# Patient Record
Sex: Female | Born: 1940
Health system: Southern US, Community
[De-identification: ages and names within clinical notes are randomized; demographics above are authoritative.]

## PROBLEM LIST (undated history)

## (undated) DIAGNOSIS — D860 Sarcoidosis of lung: Secondary | ICD-10-CM

## (undated) DIAGNOSIS — R51 Headache: Secondary | ICD-10-CM

## (undated) DIAGNOSIS — M719 Bursopathy, unspecified: Secondary | ICD-10-CM

## (undated) DIAGNOSIS — Z860100 Personal history of colon polyps, unspecified: Secondary | ICD-10-CM

## (undated) DIAGNOSIS — M810 Age-related osteoporosis without current pathological fracture: Secondary | ICD-10-CM

## (undated) DIAGNOSIS — K644 Residual hemorrhoidal skin tags: Secondary | ICD-10-CM

## (undated) DIAGNOSIS — K219 Gastro-esophageal reflux disease without esophagitis: Secondary | ICD-10-CM

## (undated) DIAGNOSIS — G4730 Sleep apnea, unspecified: Secondary | ICD-10-CM

## (undated) DIAGNOSIS — K859 Acute pancreatitis without necrosis or infection, unspecified: Secondary | ICD-10-CM

## (undated) DIAGNOSIS — F519 Sleep disorder not due to a substance or known physiological condition, unspecified: Secondary | ICD-10-CM

## (undated) DIAGNOSIS — L740 Miliaria rubra: Secondary | ICD-10-CM

## (undated) DIAGNOSIS — T7840XA Allergy, unspecified, initial encounter: Secondary | ICD-10-CM

## (undated) DIAGNOSIS — E039 Hypothyroidism, unspecified: Secondary | ICD-10-CM

## (undated) DIAGNOSIS — J342 Deviated nasal septum: Secondary | ICD-10-CM

## (undated) DIAGNOSIS — C801 Malignant (primary) neoplasm, unspecified: Secondary | ICD-10-CM

## (undated) DIAGNOSIS — Q393 Congenital stenosis and stricture of esophagus: Secondary | ICD-10-CM

## (undated) DIAGNOSIS — A809 Acute poliomyelitis, unspecified: Secondary | ICD-10-CM

## (undated) DIAGNOSIS — E538 Deficiency of other specified B group vitamins: Secondary | ICD-10-CM

## (undated) DIAGNOSIS — M1990 Unspecified osteoarthritis, unspecified site: Secondary | ICD-10-CM

## (undated) DIAGNOSIS — D509 Iron deficiency anemia, unspecified: Secondary | ICD-10-CM

## (undated) DIAGNOSIS — R5383 Other fatigue: Secondary | ICD-10-CM

## (undated) HISTORY — DX: Acute poliomyelitis, unspecified: A80.9

## (undated) HISTORY — DX: Other fatigue: R53.83

## (undated) HISTORY — DX: Acute pancreatitis without necrosis or infection, unspecified: K85.90

## (undated) HISTORY — DX: Iron deficiency anemia, unspecified: D50.9

## (undated) HISTORY — DX: Sarcoidosis of lung: D86.0

## (undated) HISTORY — DX: Gastro-esophageal reflux disease without esophagitis: K21.9

## (undated) HISTORY — DX: Personal history of colon polyps, unspecified: Z86.0100

## (undated) HISTORY — DX: Bursopathy, unspecified: M71.9

## (undated) HISTORY — DX: Deficiency of other specified B group vitamins: E53.8

## (undated) HISTORY — DX: Allergy, unspecified, initial encounter: T78.40XA

## (undated) HISTORY — DX: Congenital stenosis and stricture of esophagus: Q39.3

## (undated) HISTORY — PX: ROTATOR CUFF REPAIR: SHX139

## (undated) HISTORY — DX: Deviated nasal septum: J34.2

## (undated) HISTORY — PX: KNEE ARTHROSCOPY: SHX127

## (undated) HISTORY — DX: Age-related osteoporosis without current pathological fracture: M81.0

## (undated) HISTORY — DX: Hypothyroidism, unspecified: E03.9

## (undated) HISTORY — PX: TOE SURGERY: SHX1073

## (undated) HISTORY — PX: WISDOM TOOTH EXTRACTION: SHX21

## (undated) HISTORY — DX: Sleep disorder not due to a substance or known physiological condition, unspecified: F51.9

## (undated) HISTORY — PX: COSMETIC SURGERY: SHX468

## (undated) HISTORY — DX: Unspecified osteoarthritis, unspecified site: M19.90

## (undated) HISTORY — PX: UPPER GASTROINTESTINAL ENDOSCOPY: SHX188

## (undated) HISTORY — DX: Residual hemorrhoidal skin tags: K64.4

## (undated) HISTORY — DX: Malignant (primary) neoplasm, unspecified: C80.1

## (undated) HISTORY — DX: Personal history of colonic polyps: Z86.010

---
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
## 1986-08-01 HISTORY — PX: APPENDECTOMY: SHX54

## 1993-08-01 HISTORY — PX: LUMBAR DISC SURGERY: SHX700

## 1995-08-02 HISTORY — PX: HAND SURGERY: SHX662

## 1998-08-01 HISTORY — PX: CHOLECYSTECTOMY: SHX55

## 1998-12-15 ENCOUNTER — Ambulatory Visit (HOSPITAL_COMMUNITY): Admission: RE | Admit: 1998-12-15 | Discharge: 1998-12-15 | Payer: Self-pay | Admitting: Gastroenterology

## 1998-12-15 ENCOUNTER — Encounter: Payer: Self-pay | Admitting: Gastroenterology

## 1999-01-04 ENCOUNTER — Observation Stay (HOSPITAL_COMMUNITY): Admission: RE | Admit: 1999-01-04 | Discharge: 1999-01-05 | Payer: Self-pay

## 1999-03-24 ENCOUNTER — Other Ambulatory Visit: Admission: RE | Admit: 1999-03-24 | Discharge: 1999-03-24 | Payer: Self-pay | Admitting: Obstetrics and Gynecology

## 2000-04-11 ENCOUNTER — Other Ambulatory Visit: Admission: RE | Admit: 2000-04-11 | Discharge: 2000-04-11 | Payer: Self-pay | Admitting: Obstetrics and Gynecology

## 2000-04-12 ENCOUNTER — Encounter (INDEPENDENT_AMBULATORY_CARE_PROVIDER_SITE_OTHER): Payer: Self-pay

## 2000-04-12 ENCOUNTER — Other Ambulatory Visit: Admission: RE | Admit: 2000-04-12 | Discharge: 2000-04-12 | Payer: Self-pay | Admitting: Obstetrics and Gynecology

## 2000-04-17 ENCOUNTER — Encounter (INDEPENDENT_AMBULATORY_CARE_PROVIDER_SITE_OTHER): Payer: Self-pay | Admitting: Specialist

## 2000-04-17 ENCOUNTER — Ambulatory Visit (HOSPITAL_COMMUNITY): Admission: RE | Admit: 2000-04-17 | Discharge: 2000-04-17 | Payer: Self-pay | Admitting: *Deleted

## 2000-06-02 ENCOUNTER — Ambulatory Visit (HOSPITAL_COMMUNITY): Admission: RE | Admit: 2000-06-02 | Discharge: 2000-06-02 | Payer: Self-pay | Admitting: Gastroenterology

## 2000-06-02 ENCOUNTER — Encounter: Payer: Self-pay | Admitting: Gastroenterology

## 2000-06-23 ENCOUNTER — Encounter: Payer: Self-pay | Admitting: Gastroenterology

## 2000-06-23 ENCOUNTER — Ambulatory Visit (HOSPITAL_COMMUNITY): Admission: RE | Admit: 2000-06-23 | Discharge: 2000-06-23 | Payer: Self-pay | Admitting: Gastroenterology

## 2000-08-01 HISTORY — PX: CARPAL TUNNEL RELEASE: SHX101

## 2000-11-09 ENCOUNTER — Other Ambulatory Visit: Admission: RE | Admit: 2000-11-09 | Discharge: 2000-11-09 | Payer: Self-pay | Admitting: Orthopedic Surgery

## 2001-05-01 ENCOUNTER — Other Ambulatory Visit: Admission: RE | Admit: 2001-05-01 | Discharge: 2001-05-01 | Payer: Self-pay | Admitting: Obstetrics and Gynecology

## 2002-08-14 ENCOUNTER — Ambulatory Visit (HOSPITAL_COMMUNITY): Admission: RE | Admit: 2002-08-14 | Discharge: 2002-08-14 | Payer: Self-pay | Admitting: Gastroenterology

## 2002-08-14 ENCOUNTER — Encounter: Payer: Self-pay | Admitting: Gastroenterology

## 2002-09-06 ENCOUNTER — Encounter: Payer: Self-pay | Admitting: Internal Medicine

## 2002-09-06 ENCOUNTER — Encounter: Admission: RE | Admit: 2002-09-06 | Discharge: 2002-09-06 | Payer: Self-pay | Admitting: Internal Medicine

## 2002-10-14 ENCOUNTER — Encounter: Payer: Self-pay | Admitting: Neurosurgery

## 2002-10-14 ENCOUNTER — Encounter: Admission: RE | Admit: 2002-10-14 | Discharge: 2002-10-14 | Payer: Self-pay | Admitting: Neurosurgery

## 2002-10-28 ENCOUNTER — Encounter: Admission: RE | Admit: 2002-10-28 | Discharge: 2002-10-28 | Payer: Self-pay | Admitting: Neurosurgery

## 2002-10-28 ENCOUNTER — Encounter: Payer: Self-pay | Admitting: Neurosurgery

## 2002-12-23 ENCOUNTER — Encounter: Payer: Self-pay | Admitting: Neurosurgery

## 2002-12-23 ENCOUNTER — Encounter: Admission: RE | Admit: 2002-12-23 | Discharge: 2002-12-23 | Payer: Self-pay | Admitting: Neurosurgery

## 2003-09-23 ENCOUNTER — Encounter: Admission: RE | Admit: 2003-09-23 | Discharge: 2003-09-23 | Payer: Self-pay | Admitting: Neurosurgery

## 2003-10-15 ENCOUNTER — Encounter: Admission: RE | Admit: 2003-10-15 | Discharge: 2003-10-15 | Payer: Self-pay | Admitting: Neurosurgery

## 2003-11-03 ENCOUNTER — Encounter: Admission: RE | Admit: 2003-11-03 | Discharge: 2003-11-03 | Payer: Self-pay | Admitting: Neurosurgery

## 2004-06-03 ENCOUNTER — Ambulatory Visit: Payer: Self-pay | Admitting: Internal Medicine

## 2004-08-04 ENCOUNTER — Ambulatory Visit: Payer: Self-pay | Admitting: Internal Medicine

## 2004-11-24 ENCOUNTER — Ambulatory Visit: Payer: Self-pay | Admitting: Internal Medicine

## 2004-12-02 ENCOUNTER — Ambulatory Visit: Payer: Self-pay | Admitting: Internal Medicine

## 2004-12-06 ENCOUNTER — Ambulatory Visit: Payer: Self-pay | Admitting: Gastroenterology

## 2004-12-20 ENCOUNTER — Ambulatory Visit: Payer: Self-pay | Admitting: Gastroenterology

## 2005-01-18 ENCOUNTER — Ambulatory Visit: Payer: Self-pay | Admitting: Internal Medicine

## 2005-05-12 ENCOUNTER — Ambulatory Visit (HOSPITAL_COMMUNITY): Admission: RE | Admit: 2005-05-12 | Discharge: 2005-05-12 | Payer: Self-pay | Admitting: Orthopedic Surgery

## 2005-05-12 ENCOUNTER — Ambulatory Visit (HOSPITAL_BASED_OUTPATIENT_CLINIC_OR_DEPARTMENT_OTHER): Admission: RE | Admit: 2005-05-12 | Discharge: 2005-05-12 | Payer: Self-pay | Admitting: Orthopedic Surgery

## 2005-06-02 ENCOUNTER — Ambulatory Visit: Payer: Self-pay | Admitting: Internal Medicine

## 2005-11-24 ENCOUNTER — Ambulatory Visit: Payer: Self-pay | Admitting: Internal Medicine

## 2005-11-25 ENCOUNTER — Ambulatory Visit: Payer: Self-pay | Admitting: Internal Medicine

## 2005-12-01 ENCOUNTER — Ambulatory Visit: Payer: Self-pay | Admitting: Internal Medicine

## 2006-03-06 ENCOUNTER — Ambulatory Visit: Payer: Self-pay | Admitting: Internal Medicine

## 2006-03-17 ENCOUNTER — Ambulatory Visit: Payer: Self-pay | Admitting: Internal Medicine

## 2006-08-01 DIAGNOSIS — K644 Residual hemorrhoidal skin tags: Secondary | ICD-10-CM

## 2006-08-01 DIAGNOSIS — K222 Esophageal obstruction: Secondary | ICD-10-CM

## 2006-08-01 DIAGNOSIS — Q393 Congenital stenosis and stricture of esophagus: Secondary | ICD-10-CM

## 2006-08-01 HISTORY — DX: Esophageal obstruction: K22.2

## 2006-08-01 HISTORY — DX: Residual hemorrhoidal skin tags: K64.4

## 2006-08-01 HISTORY — DX: Congenital stenosis and stricture of esophagus: Q39.3

## 2006-09-04 ENCOUNTER — Ambulatory Visit: Payer: Self-pay | Admitting: Internal Medicine

## 2006-10-16 ENCOUNTER — Ambulatory Visit: Payer: Self-pay | Admitting: Gastroenterology

## 2006-11-13 ENCOUNTER — Ambulatory Visit: Payer: Self-pay | Admitting: Internal Medicine

## 2006-12-11 ENCOUNTER — Ambulatory Visit: Payer: Self-pay | Admitting: Gastroenterology

## 2006-12-19 ENCOUNTER — Encounter (INDEPENDENT_AMBULATORY_CARE_PROVIDER_SITE_OTHER): Payer: Self-pay | Admitting: Gastroenterology

## 2006-12-19 ENCOUNTER — Encounter (INDEPENDENT_AMBULATORY_CARE_PROVIDER_SITE_OTHER): Payer: Self-pay | Admitting: *Deleted

## 2006-12-19 ENCOUNTER — Ambulatory Visit: Payer: Self-pay | Admitting: Gastroenterology

## 2006-12-19 HISTORY — PX: COLONOSCOPY: SHX174

## 2006-12-19 HISTORY — PX: ESOPHAGOGASTRODUODENOSCOPY: SHX1529

## 2006-12-27 ENCOUNTER — Ambulatory Visit: Payer: Self-pay | Admitting: Internal Medicine

## 2006-12-28 ENCOUNTER — Encounter: Payer: Self-pay | Admitting: Internal Medicine

## 2006-12-28 LAB — CONVERTED CEMR LAB
ALT: 29 units/L (ref 0–40)
AST: 30 units/L (ref 0–37)
Albumin ELP: 50.1 % — ABNORMAL LOW (ref 55.8–66.1)
Albumin: 3.6 g/dL (ref 3.5–5.2)
Alkaline Phosphatase: 85 units/L (ref 39–117)
Alpha-2-Globulin: 13.8 % — ABNORMAL HIGH (ref 7.1–11.8)
BUN: 15 mg/dL (ref 6–23)
Basophils Absolute: 0.1 10*3/uL (ref 0.0–0.1)
Beta Globulin: 7.2 % (ref 4.7–7.2)
Bilirubin, Direct: 0.1 mg/dL (ref 0.0–0.3)
Calcium: 9.4 mg/dL (ref 8.4–10.5)
Chloride: 103 meq/L (ref 96–112)
Eosinophils Absolute: 0.4 10*3/uL (ref 0.0–0.6)
GFR calc Af Amer: 128 mL/min
GFR calc non Af Amer: 106 mL/min
Gamma Globulin: 18.7 % (ref 11.1–18.8)
Lymphocytes Relative: 29.2 % (ref 12.0–46.0)
MCV: 77.3 fL — ABNORMAL LOW (ref 78.0–100.0)
Monocytes Relative: 11 % (ref 3.0–11.0)
Neutro Abs: 4.2 10*3/uL (ref 1.4–7.7)
Platelets: 468 10*3/uL — ABNORMAL HIGH (ref 150–400)
RBC: 4.6 M/uL (ref 3.87–5.11)
Rhuematoid fact SerPl-aCnc: <20 intl units/mL — ABNORMAL LOW (ref 0.0–20.0)
Saturation Ratios: 7.4 % — ABNORMAL LOW (ref 20.0–50.0)
Sed Rate: 35 mm/hr — ABNORMAL HIGH (ref 0–25)
TSH: 0.19 microintl units/mL — ABNORMAL LOW (ref 0.35–5.50)
Total Protein, Serum Electrophoresis: 8.2 g/dL (ref 6.0–8.3)
Vitamin B-12: 157 pg/mL — ABNORMAL LOW (ref 211–911)
WBC: 7.9 10*3/uL (ref 4.5–10.5)

## 2007-01-03 ENCOUNTER — Ambulatory Visit: Payer: Self-pay | Admitting: Cardiology

## 2007-01-09 ENCOUNTER — Ambulatory Visit: Payer: Self-pay | Admitting: Internal Medicine

## 2007-01-09 LAB — CONVERTED CEMR LAB
Hemoglobin, Urine: NEGATIVE
Nitrite: NEGATIVE
Total Protein, Urine: NEGATIVE mg/dL
Urine Glucose: NEGATIVE mg/dL
Urobilinogen, UA: 0.2 (ref 0.0–1.0)
pH: 6 (ref 5.0–8.0)

## 2007-02-05 ENCOUNTER — Ambulatory Visit: Payer: Self-pay | Admitting: Internal Medicine

## 2007-02-20 ENCOUNTER — Ambulatory Visit: Payer: Self-pay | Admitting: Critical Care Medicine

## 2007-02-23 ENCOUNTER — Ambulatory Visit: Admission: RE | Admit: 2007-02-23 | Discharge: 2007-02-23 | Payer: Self-pay | Admitting: Critical Care Medicine

## 2007-02-23 ENCOUNTER — Encounter: Payer: Self-pay | Admitting: Critical Care Medicine

## 2007-02-23 ENCOUNTER — Ambulatory Visit: Payer: Self-pay | Admitting: Critical Care Medicine

## 2007-03-06 ENCOUNTER — Ambulatory Visit: Payer: Self-pay | Admitting: Critical Care Medicine

## 2007-03-23 ENCOUNTER — Ambulatory Visit: Payer: Self-pay | Admitting: Critical Care Medicine

## 2007-05-07 ENCOUNTER — Ambulatory Visit: Payer: Self-pay | Admitting: Internal Medicine

## 2007-05-07 LAB — CONVERTED CEMR LAB
ALT: 25 units/L (ref 0–35)
Alkaline Phosphatase: 76 units/L (ref 39–117)
BUN: 18 mg/dL (ref 6–23)
Basophils Relative: 0.7 % (ref 0.0–1.0)
CO2: 30 meq/L (ref 19–32)
Calcium: 9.2 mg/dL (ref 8.4–10.5)
Chloride: 106 meq/L (ref 96–112)
Creatinine, Ser: 0.6 mg/dL (ref 0.4–1.2)
HDL: 49.8 mg/dL (ref >39.0)
LDL Cholesterol: 79 mg/dL (ref 0–99)
Monocytes Relative: 11.7 % — ABNORMAL HIGH (ref 3.0–11.0)
Platelets: 303 10*3/uL (ref 150–400)
RDW: 14.5 % (ref 11.5–14.6)
Saturation Ratios: 22.9 % (ref 20.0–50.0)
Total Bilirubin: 0.5 mg/dL (ref 0.3–1.2)
Total Protein: 7.1 g/dL (ref 6.0–8.3)
Triglycerides: 91 mg/dL (ref 0–149)
VLDL: 18 mg/dL (ref 0–40)

## 2007-05-09 ENCOUNTER — Ambulatory Visit: Payer: Self-pay | Admitting: Critical Care Medicine

## 2007-05-09 DIAGNOSIS — D509 Iron deficiency anemia, unspecified: Secondary | ICD-10-CM | POA: Insufficient documentation

## 2007-05-09 DIAGNOSIS — D869 Sarcoidosis, unspecified: Secondary | ICD-10-CM | POA: Insufficient documentation

## 2007-05-09 DIAGNOSIS — Z860100 Personal history of colon polyps, unspecified: Secondary | ICD-10-CM | POA: Insufficient documentation

## 2007-05-09 DIAGNOSIS — K219 Gastro-esophageal reflux disease without esophagitis: Secondary | ICD-10-CM | POA: Insufficient documentation

## 2007-05-09 DIAGNOSIS — E039 Hypothyroidism, unspecified: Secondary | ICD-10-CM | POA: Insufficient documentation

## 2007-05-09 DIAGNOSIS — J309 Allergic rhinitis, unspecified: Secondary | ICD-10-CM | POA: Insufficient documentation

## 2007-05-10 ENCOUNTER — Ambulatory Visit: Payer: Self-pay | Admitting: Internal Medicine

## 2007-05-10 ENCOUNTER — Encounter: Payer: Self-pay | Admitting: Internal Medicine

## 2007-05-10 DIAGNOSIS — F519 Sleep disorder not due to a substance or known physiological condition, unspecified: Secondary | ICD-10-CM | POA: Insufficient documentation

## 2007-05-10 DIAGNOSIS — R5381 Other malaise: Secondary | ICD-10-CM | POA: Insufficient documentation

## 2007-05-10 DIAGNOSIS — R5383 Other fatigue: Secondary | ICD-10-CM | POA: Insufficient documentation

## 2007-05-29 ENCOUNTER — Telehealth: Payer: Self-pay | Admitting: Internal Medicine

## 2007-08-06 ENCOUNTER — Ambulatory Visit: Payer: Self-pay | Admitting: Internal Medicine

## 2007-08-06 LAB — CONVERTED CEMR LAB
Chloride: 106 meq/L (ref 96–112)
Eosinophils Absolute: 0.3 10*3/uL (ref 0.0–0.6)
Eosinophils Relative: 3.8 % (ref 0.0–5.0)
GFR calc non Af Amer: 106 mL/min
Glucose, Bld: 96 mg/dL (ref 70–99)
HCT: 35.5 % — ABNORMAL LOW (ref 36.0–46.0)
Lymphocytes Relative: 30.8 % (ref 12.0–46.0)
MCV: 86 fL (ref 78.0–100.0)
Neutrophils Relative %: 55.4 % (ref 43.0–77.0)
RBC: 4.13 M/uL (ref 3.87–5.11)
Sodium: 143 meq/L (ref 135–145)
WBC: 8.6 10*3/uL (ref 4.5–10.5)

## 2007-08-08 ENCOUNTER — Encounter: Payer: Self-pay | Admitting: Internal Medicine

## 2007-08-09 ENCOUNTER — Ambulatory Visit: Payer: Self-pay | Admitting: Critical Care Medicine

## 2007-08-16 ENCOUNTER — Ambulatory Visit: Payer: Self-pay | Admitting: Internal Medicine

## 2007-08-16 ENCOUNTER — Telehealth: Payer: Self-pay | Admitting: Internal Medicine

## 2007-08-16 DIAGNOSIS — E559 Vitamin D deficiency, unspecified: Secondary | ICD-10-CM | POA: Insufficient documentation

## 2007-08-16 DIAGNOSIS — E538 Deficiency of other specified B group vitamins: Secondary | ICD-10-CM | POA: Insufficient documentation

## 2007-08-16 DIAGNOSIS — R109 Unspecified abdominal pain: Secondary | ICD-10-CM | POA: Insufficient documentation

## 2007-08-16 DIAGNOSIS — Z8719 Personal history of other diseases of the digestive system: Secondary | ICD-10-CM | POA: Insufficient documentation

## 2007-08-16 DIAGNOSIS — R945 Abnormal results of liver function studies: Secondary | ICD-10-CM | POA: Insufficient documentation

## 2007-08-17 ENCOUNTER — Encounter (INDEPENDENT_AMBULATORY_CARE_PROVIDER_SITE_OTHER): Payer: Self-pay | Admitting: *Deleted

## 2007-08-17 LAB — CONVERTED CEMR LAB
ALT: 73 units/L — ABNORMAL HIGH (ref 0–35)
Bilirubin, Direct: 0.1 mg/dL (ref 0.0–0.3)
Eosinophils Absolute: 0.2 10*3/uL (ref 0.0–0.6)
Eosinophils Relative: 2 % (ref 0.0–5.0)
HCT: 41.8 % (ref 36.0–46.0)
Hemoglobin: 14.3 g/dL (ref 12.0–15.0)
Leukocytes, UA: NEGATIVE
Lipase: 27 units/L (ref 11.0–59.0)
Lymphocytes Relative: 28.6 % (ref 12.0–46.0)
MCV: 87.1 fL (ref 78.0–100.0)
Neutro Abs: 4.7 10*3/uL (ref 1.4–7.7)
Neutrophils Relative %: 55.5 % (ref 43.0–77.0)
Nitrite: NEGATIVE
Total Bilirubin: 0.4 mg/dL (ref 0.3–1.2)
Total Protein: 7.9 g/dL (ref 6.0–8.3)
Urobilinogen, UA: 0.2 (ref 0.0–1.0)
Vitamin B-12: 783 pg/mL (ref 211–911)
WBC: 8.6 10*3/uL (ref 4.5–10.5)

## 2007-08-22 ENCOUNTER — Encounter: Payer: Self-pay | Admitting: Internal Medicine

## 2007-09-06 ENCOUNTER — Ambulatory Visit: Payer: Self-pay | Admitting: Internal Medicine

## 2007-09-06 LAB — CONVERTED CEMR LAB
ALT: 25 units/L (ref 0–35)
Bilirubin, Direct: 0.1 mg/dL (ref 0.0–0.3)
Total Bilirubin: 0.6 mg/dL (ref 0.3–1.2)
Total Protein: 7.4 g/dL (ref 6.0–8.3)

## 2007-09-08 ENCOUNTER — Encounter: Admission: RE | Admit: 2007-09-08 | Discharge: 2007-09-08 | Payer: Self-pay | Admitting: Family Medicine

## 2007-11-19 ENCOUNTER — Encounter: Payer: Self-pay | Admitting: Internal Medicine

## 2007-12-18 ENCOUNTER — Encounter: Payer: Self-pay | Admitting: Internal Medicine

## 2008-01-10 ENCOUNTER — Encounter: Admission: RE | Admit: 2008-01-10 | Discharge: 2008-02-14 | Payer: Self-pay | Admitting: Orthopedic Surgery

## 2008-01-14 IMAGING — CT CT CHEST W/ CM
2 of 4 series · 15 of 36 positions shown, 18 images · IV contrast (Omnipaque 300)
Comparison: None.

CLINICAL DATA: Right upper quadrant abdominal pain, chest pain.  Weight loss and fatigue.  
CHEST CT WITH CONTRAST:
TECHNIQUE: Multidetector CT imaging of the chest was performed following the standard protocol during bolus administration of intravenous contrast.
Contrast:  80 cc Omnipaque 300

[Series 2: chest_routine 5.0 b40f st · axial · 0.69mm/px · z∈[-264,-44]mm · 12 of 52 slices shown, 15 images]
[im 4/52  mediastinal]
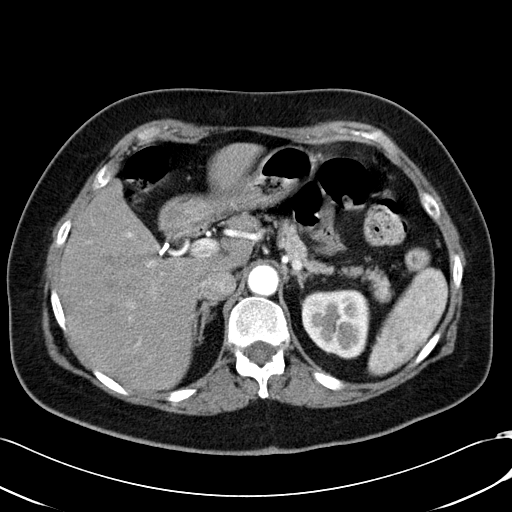
[im 4/52  lung]
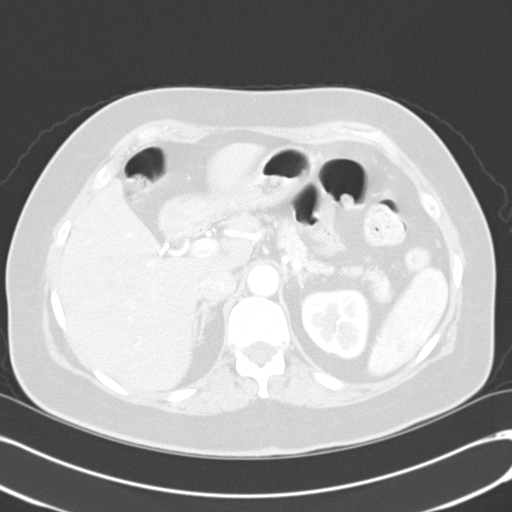
[im 8/52  lung]
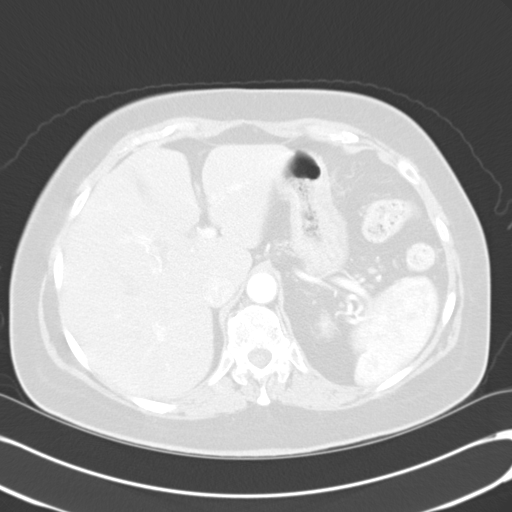
[im 12/52  lung]
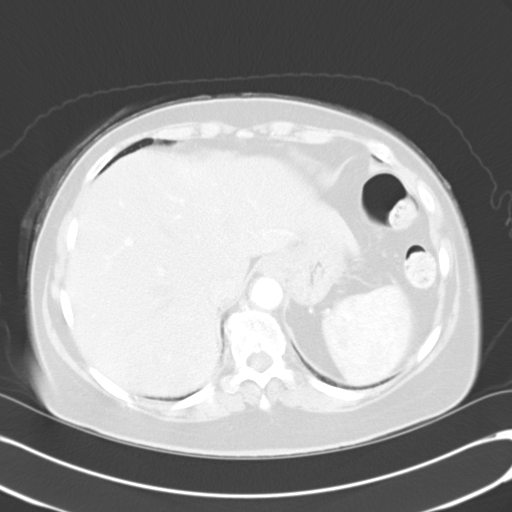
[im 16/52  lung]
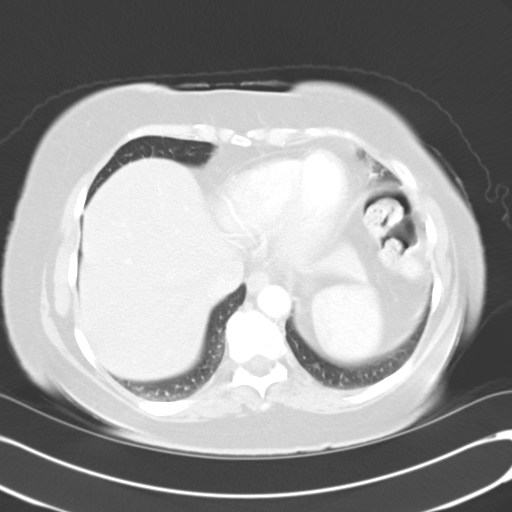
[im 20/52  mediastinal]
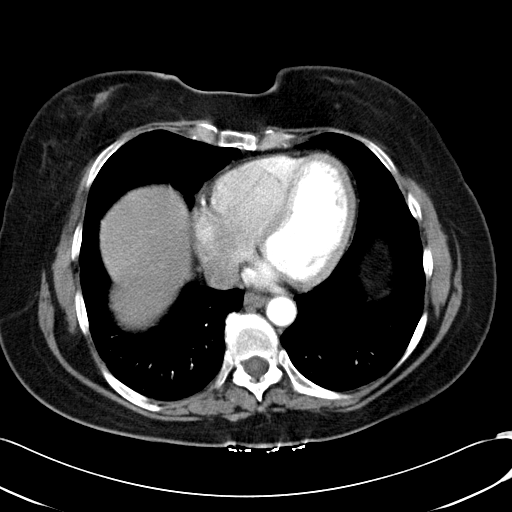
[im 20/52  lung]
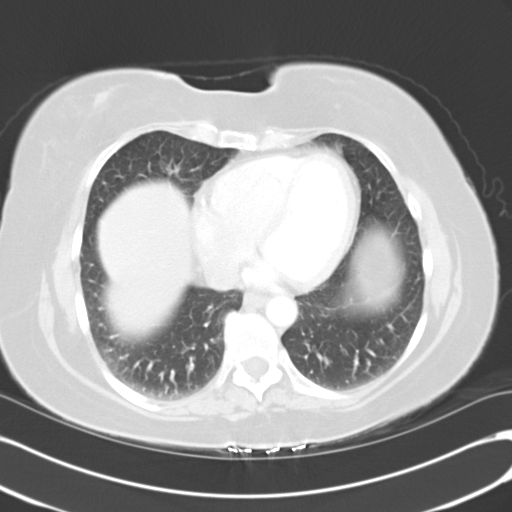
[im 24/52  lung]
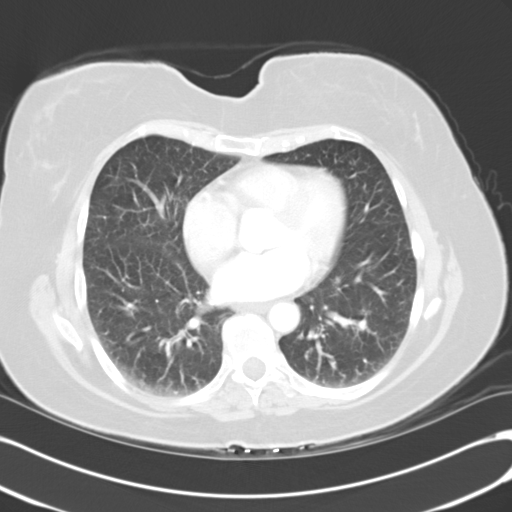
[im 28/52  lung]
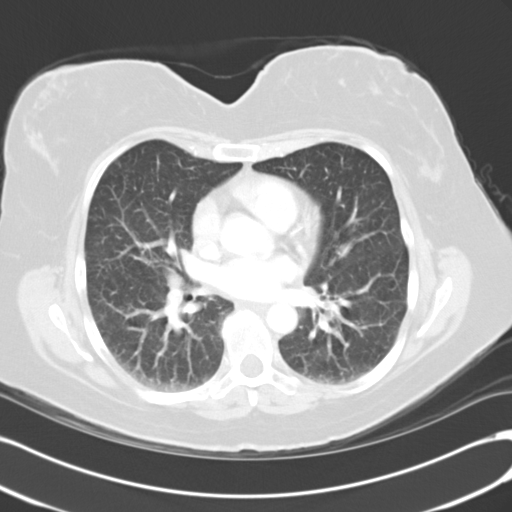
[im 32/52  lung]
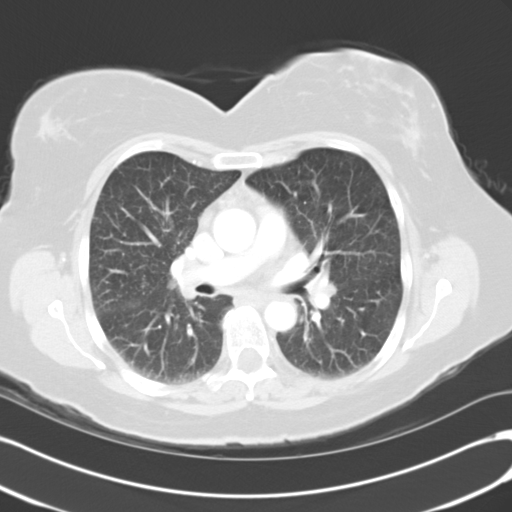
[im 36/52  mediastinal]
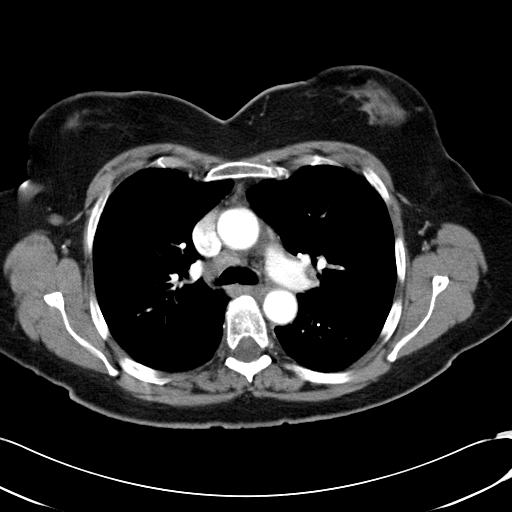
[im 36/52  lung]
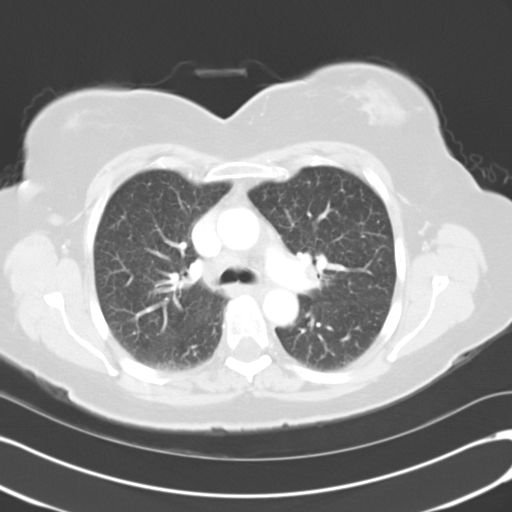
[im 40/52  lung]
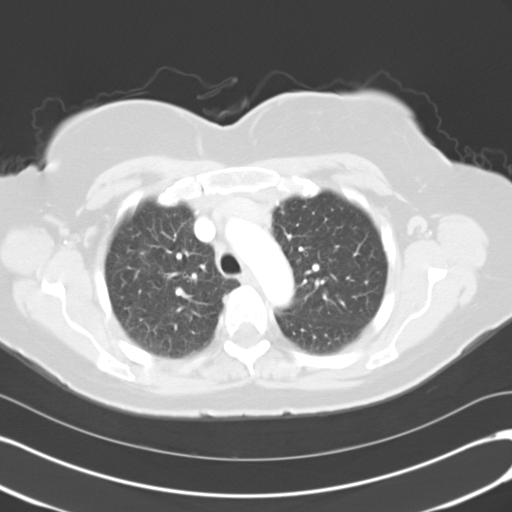
[im 44/52  lung]
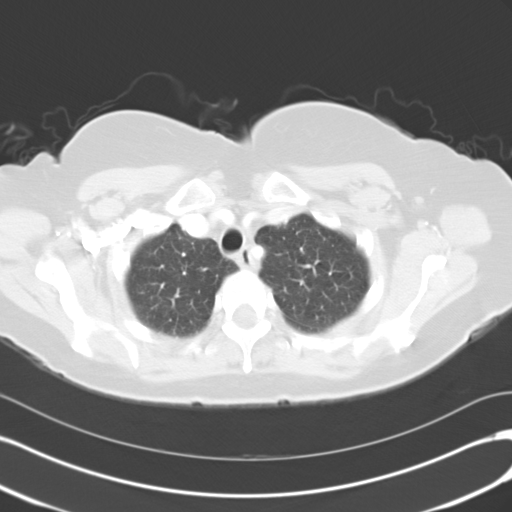
[im 48/52  lung]
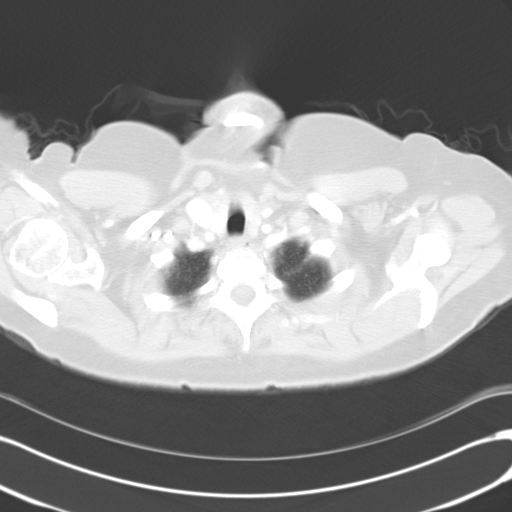

[Series 602: <mpr range> · coronal · 0.69mm/px · 3 of 85 slices shown]
[im 17/85  lung]
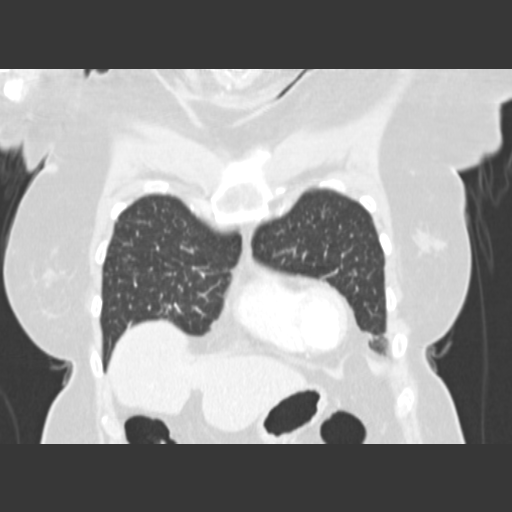
[im 34/85  lung]
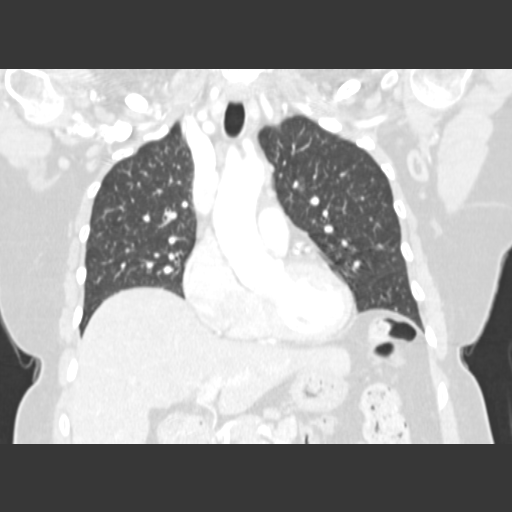
[im 51/85  lung]
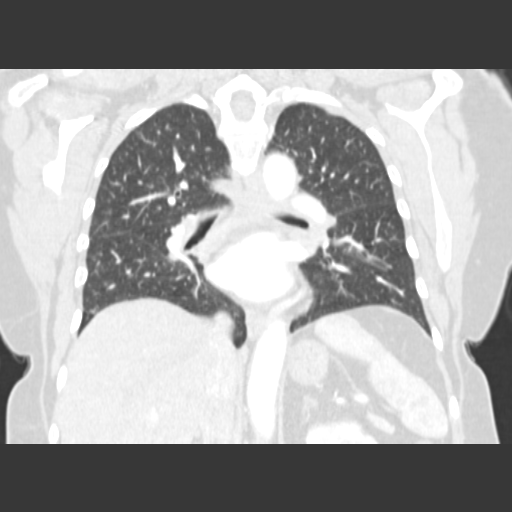

[15 of 36 positions shown; findings below may reference images not displayed]

FINDINGS: Prevascular lymph node measures 9 mm (image 22).  Lymph node anterior to the right mainstem bronchus measures 9 mm.  There is mildly prominent bihilar lymphoid tissue.  Coronary artery calcification.  Heart size is at the upper limits of normal.  No pericardial fluid.  
Respiratory motion degrades image quality upon review of the lung windows.  There is subtle peribronchovascular and fissural nodularity with an upper lobe predominance.  Lungs are otherwise clear.  No pleural fluid.  Airway unremarkable.  
Incidental imaging of the upper abdomen shows very mild intrahepatic biliary duct dilatation after cholecystectomy.  Heterogeneous enhancement pattern in the spleen has a nodular appearance.  Gastrohepatic ligament lymph nodes measure up to 1.4 cm.  No worrisome lytic or sclerotic lesions.
IMPRESSION: 1.  No cause for patient?s given symptoms is identified.  No acute findings.  
2.  Subtle upper lobe predominant perilymphatic nodularity with mildly prominent mediastinal and bihilar lymphoid tissue as well as gastrohepatic ligament lymph node.  Question sarcoid.
3.  Nodular splenic enhancement pattern.  While this may be due to early phase of contrast enhancement, sarcoid can have this appearance.

## 2008-02-12 ENCOUNTER — Ambulatory Visit: Payer: Self-pay | Admitting: Internal Medicine

## 2008-02-12 DIAGNOSIS — R109 Unspecified abdominal pain: Secondary | ICD-10-CM | POA: Insufficient documentation

## 2008-02-12 LAB — CONVERTED CEMR LAB: Vit D, 1,25-Dihydroxy: 34 (ref 30–89)

## 2008-02-13 LAB — CONVERTED CEMR LAB
Albumin: 3.8 g/dL (ref 3.5–5.2)
Alkaline Phosphatase: 63 units/L (ref 39–117)
Basophils Absolute: 0.1 10*3/uL (ref 0.0–0.1)
Basophils Relative: 1.2 % — ABNORMAL HIGH (ref 0.0–1.0)
Bilirubin, Direct: 0.1 mg/dL (ref 0.0–0.3)
Calcium: 9.3 mg/dL (ref 8.4–10.5)
Eosinophils Absolute: 0.3 10*3/uL (ref 0.0–0.7)
GFR calc Af Amer: 127 mL/min
GFR calc non Af Amer: 105 mL/min
Glucose, Bld: 99 mg/dL (ref 70–99)
Hemoglobin, Urine: NEGATIVE
Ketones, ur: NEGATIVE mg/dL
Lipase: 48 units/L (ref 11.0–59.0)
Lymphocytes Relative: 36.2 % (ref 12.0–46.0)
MCHC: 34.6 g/dL (ref 30.0–36.0)
MCV: 86.6 fL (ref 78.0–100.0)
Mucus, UA: NEGATIVE
Neutrophils Relative %: 47.4 % (ref 43.0–77.0)
Platelets: 329 10*3/uL (ref 150–400)
Potassium: 4.2 meq/L (ref 3.5–5.1)
RBC / HPF: NONE SEEN
RBC: 4.28 M/uL (ref 3.87–5.11)
RDW: 12.5 % (ref 11.5–14.6)
Sed Rate: 25 mm/hr — ABNORMAL HIGH (ref 0–22)
Sodium: 138 meq/L (ref 135–145)
Specific Gravity, Urine: <=1.005 (ref 1.000–1.03)
Total Protein: 7.1 g/dL (ref 6.0–8.3)
Urine Glucose: NEGATIVE mg/dL
Urobilinogen, UA: 0.2 (ref 0.0–1.0)
Vitamin B-12: 271 pg/mL (ref 211–911)

## 2008-02-18 ENCOUNTER — Ambulatory Visit: Payer: Self-pay | Admitting: Critical Care Medicine

## 2008-02-18 ENCOUNTER — Telehealth: Payer: Self-pay | Admitting: Internal Medicine

## 2008-04-28 ENCOUNTER — Ambulatory Visit: Payer: Self-pay | Admitting: Pulmonary Disease

## 2008-05-16 ENCOUNTER — Encounter: Payer: Self-pay | Admitting: Internal Medicine

## 2008-06-03 ENCOUNTER — Telehealth: Payer: Self-pay | Admitting: Internal Medicine

## 2008-06-06 ENCOUNTER — Ambulatory Visit (HOSPITAL_BASED_OUTPATIENT_CLINIC_OR_DEPARTMENT_OTHER): Admission: RE | Admit: 2008-06-06 | Discharge: 2008-06-06 | Payer: Self-pay | Admitting: Pulmonary Disease

## 2008-06-09 ENCOUNTER — Encounter: Payer: Self-pay | Admitting: Pulmonary Disease

## 2008-06-10 ENCOUNTER — Ambulatory Visit: Payer: Self-pay | Admitting: Critical Care Medicine

## 2008-06-11 ENCOUNTER — Ambulatory Visit: Payer: Self-pay | Admitting: Pulmonary Disease

## 2008-06-20 ENCOUNTER — Ambulatory Visit: Payer: Self-pay | Admitting: Pulmonary Disease

## 2008-07-01 ENCOUNTER — Encounter: Payer: Self-pay | Admitting: Pulmonary Disease

## 2008-07-08 ENCOUNTER — Encounter: Payer: Self-pay | Admitting: Internal Medicine

## 2008-07-09 ENCOUNTER — Telehealth: Payer: Self-pay | Admitting: Pulmonary Disease

## 2008-07-22 ENCOUNTER — Ambulatory Visit: Payer: Self-pay | Admitting: Pulmonary Disease

## 2008-07-22 DIAGNOSIS — Z9989 Dependence on other enabling machines and devices: Secondary | ICD-10-CM | POA: Insufficient documentation

## 2008-07-22 DIAGNOSIS — G4733 Obstructive sleep apnea (adult) (pediatric): Secondary | ICD-10-CM | POA: Insufficient documentation

## 2008-08-15 ENCOUNTER — Encounter: Payer: Self-pay | Admitting: Internal Medicine

## 2008-08-17 ENCOUNTER — Encounter: Payer: Self-pay | Admitting: Internal Medicine

## 2008-08-19 ENCOUNTER — Encounter: Payer: Self-pay | Admitting: Internal Medicine

## 2008-10-08 ENCOUNTER — Ambulatory Visit: Payer: Self-pay | Admitting: Critical Care Medicine

## 2008-11-04 ENCOUNTER — Ambulatory Visit: Payer: Self-pay | Admitting: Internal Medicine

## 2008-11-04 DIAGNOSIS — N3000 Acute cystitis without hematuria: Secondary | ICD-10-CM | POA: Insufficient documentation

## 2008-11-05 LAB — CONVERTED CEMR LAB
Hemoglobin, Urine: NEGATIVE
Ketones, ur: NEGATIVE mg/dL
Leukocytes, UA: NEGATIVE
Urine Glucose: NEGATIVE mg/dL
Urobilinogen, UA: 0.2 (ref 0.0–1.0)

## 2008-11-12 ENCOUNTER — Telehealth (INDEPENDENT_AMBULATORY_CARE_PROVIDER_SITE_OTHER): Payer: Self-pay | Admitting: *Deleted

## 2008-11-21 ENCOUNTER — Encounter: Payer: Self-pay | Admitting: Internal Medicine

## 2008-11-21 ENCOUNTER — Ambulatory Visit: Payer: Self-pay | Admitting: Internal Medicine

## 2008-11-21 DIAGNOSIS — R05 Cough: Secondary | ICD-10-CM

## 2008-11-21 DIAGNOSIS — J019 Acute sinusitis, unspecified: Secondary | ICD-10-CM | POA: Insufficient documentation

## 2008-11-21 DIAGNOSIS — R059 Cough, unspecified: Secondary | ICD-10-CM | POA: Insufficient documentation

## 2008-12-08 ENCOUNTER — Ambulatory Visit: Payer: Self-pay | Admitting: Internal Medicine

## 2009-01-19 ENCOUNTER — Ambulatory Visit: Payer: Self-pay | Admitting: Pulmonary Disease

## 2009-02-20 ENCOUNTER — Encounter: Payer: Self-pay | Admitting: Internal Medicine

## 2009-03-06 ENCOUNTER — Ambulatory Visit: Payer: Self-pay | Admitting: Critical Care Medicine

## 2009-06-10 ENCOUNTER — Telehealth: Payer: Self-pay | Admitting: Internal Medicine

## 2009-07-06 ENCOUNTER — Ambulatory Visit: Payer: Self-pay | Admitting: Internal Medicine

## 2009-07-14 ENCOUNTER — Telehealth (INDEPENDENT_AMBULATORY_CARE_PROVIDER_SITE_OTHER): Payer: Self-pay | Admitting: *Deleted

## 2009-07-21 ENCOUNTER — Telehealth: Payer: Self-pay | Admitting: Internal Medicine

## 2009-07-22 ENCOUNTER — Telehealth: Payer: Self-pay | Admitting: Critical Care Medicine

## 2009-08-26 ENCOUNTER — Ambulatory Visit: Payer: Self-pay | Admitting: Critical Care Medicine

## 2009-08-27 ENCOUNTER — Telehealth: Payer: Self-pay | Admitting: Internal Medicine

## 2009-10-07 ENCOUNTER — Telehealth: Payer: Self-pay | Admitting: Internal Medicine

## 2009-11-25 ENCOUNTER — Encounter: Payer: Self-pay | Admitting: Pulmonary Disease

## 2009-12-11 ENCOUNTER — Encounter: Payer: Self-pay | Admitting: Pulmonary Disease

## 2010-01-25 ENCOUNTER — Ambulatory Visit: Payer: Self-pay | Admitting: Pulmonary Disease

## 2010-01-26 ENCOUNTER — Ambulatory Visit: Payer: Self-pay | Admitting: Critical Care Medicine

## 2010-01-29 ENCOUNTER — Telehealth: Payer: Self-pay | Admitting: Internal Medicine

## 2010-02-23 ENCOUNTER — Ambulatory Visit: Payer: Self-pay | Admitting: Critical Care Medicine

## 2010-02-24 ENCOUNTER — Encounter: Payer: Self-pay | Admitting: Critical Care Medicine

## 2010-03-05 ENCOUNTER — Ambulatory Visit: Payer: Self-pay | Admitting: Internal Medicine

## 2010-03-05 ENCOUNTER — Encounter: Payer: Self-pay | Admitting: Internal Medicine

## 2010-03-05 DIAGNOSIS — R42 Dizziness and giddiness: Secondary | ICD-10-CM | POA: Insufficient documentation

## 2010-03-05 LAB — CONVERTED CEMR LAB
AST: 24 units/L (ref 0–37)
Alkaline Phosphatase: 72 units/L (ref 39–117)
BUN: 17 mg/dL (ref 6–23)
Basophils Absolute: 0.1 10*3/uL (ref 0.0–0.1)
Bilirubin Urine: NEGATIVE
Bilirubin, Direct: 0.1 mg/dL (ref 0.0–0.3)
Calcium: 9.5 mg/dL (ref 8.4–10.5)
Cholesterol: 169 mg/dL (ref 0–200)
GFR calc non Af Amer: 93.76 mL/min (ref >60)
Glucose, Bld: 101 mg/dL — ABNORMAL HIGH (ref 70–99)
Hemoglobin, Urine: NEGATIVE
LDL Cholesterol: 87 mg/dL (ref 0–99)
Lymphocytes Relative: 29.8 % (ref 12.0–46.0)
Monocytes Relative: 9.3 % (ref 3.0–12.0)
Neutrophils Relative %: 57 % (ref 43.0–77.0)
Nitrite: NEGATIVE
Platelets: 337 10*3/uL (ref 150.0–400.0)
RDW: 14.3 % (ref 11.5–14.6)
Total Bilirubin: 0.4 mg/dL (ref 0.3–1.2)
Total CHOL/HDL Ratio: 2
Total Protein, Urine: NEGATIVE mg/dL
Urobilinogen, UA: 0.2 (ref 0.0–1.0)
VLDL: 13.6 mg/dL (ref 0.0–40.0)
WBC: 7.6 10*3/uL (ref 4.5–10.5)

## 2010-04-01 ENCOUNTER — Telehealth: Payer: Self-pay | Admitting: Internal Medicine

## 2010-07-09 ENCOUNTER — Ambulatory Visit: Payer: Self-pay | Admitting: Critical Care Medicine

## 2010-07-21 ENCOUNTER — Telehealth: Payer: Self-pay | Admitting: Pulmonary Disease

## 2010-07-21 ENCOUNTER — Telehealth: Payer: Self-pay | Admitting: Internal Medicine

## 2010-07-22 ENCOUNTER — Ambulatory Visit: Payer: Self-pay | Admitting: Critical Care Medicine

## 2010-08-17 ENCOUNTER — Telehealth: Payer: Self-pay | Admitting: Internal Medicine

## 2010-09-02 NOTE — Progress Notes (Signed)
Summary: Rf Ambien-Plot pt  Phone Note Refill Request Message from:  Fax from Pharmacy  Refills Requested: Medication #1:  ZOLPIDEM TARTRATE 10 MG  TABS 1/2 or 1 by mouth at hs prn   Dosage confirmed as above?Dosage Confirmed   Supply Requested: 90   Last Refilled: 06/10/2010  Method Requested: Telephone to Pharmacy Next Appointment Scheduled: 09-10-10 Initial call taken by: Lanier Prude, [facility]),  August 17, 2010 8:56 AM  Follow-up for Phone Call        ok for refill on zolpidem both with 5 add'l refills Follow-up by: Jacques Navy MD,  August 17, 2010 1:08 PM  Additional Follow-up for Phone Call Additional follow up Details #1::        Rx called to pharmacy Additional Follow-up by: Lanier Prude, [facility]),  August 17, 2010 3:26 PM    Prescriptions: ZOLPIDEM TARTRATE 10 MG  TABS (ZOLPIDEM TARTRATE) 1/2 or 1 by mouth at hs prn  #90 x 1   Entered by:   Lanier Prude, CMA(AAMA)   Authorized by:   Tresa Garter MD   Signed by:   Lanier Prude, CMA(AAMA) on 08/17/2010   Method used:   Telephoned to ...       [facility]  [address] [phone]* (retail)       [address]       [place], [place]  [address]       Ph: [phone] or [phone]       Fax: [phone]   RxID:   [id number]

## 2010-09-02 NOTE — Assessment & Plan Note (Signed)
Summary: Pulmonary OV   Copy to:  Dr Shan Levans Primary Provider/Referring Provider:  Dr. Sonda Primes  CC:  6 month follow up.  scratchy throat, PND, chest tightness, occas chest congestion, and nonprod cough x 2 wks.  .  History of Present Illness: This is a 70 year old, white female with stage II sarcoidosis.     tightness.    January 26, 2010 5:13 PM The pt has been doing well since 1/11.  No increase cough.  Had bronchitis in 3/11 and saw PCP. No abx since.   Pt denies any significant sore throat, nasal congestion or excess secretions, fever, chills, sweats, unintended weight loss, pleurtic or exertional chest pain, orthopnea PND, or leg swelling Pt denies any increase in rescue therapy over baseline, denies waking up needing it or having any early am or nocturnal exacerbations of coughing/wheezing/or dyspnea.   July 09, 2010 9:56 AM The pt was doing well  until  she developed  a sore throat,   these symptoms started over end of 11/11.  Notes cough but is dry. Notes pndrip.  Notes a sinus headache.  Notes low grade fever, worse late afternoon.  No mucus out of nose.   Notes dyspnea is sl worse.   Notes more knee pain. No heartburn on zegerid.  Preventive Screening-Counseling & Management  Alcohol-Tobacco     Alcohol drinks/day: 0     Smoking Status: quit > 6 months     Year Quit: 1992  Current Medications (verified): 1)  Levoxyl 112 Mcg Tabs (Levothyroxine Sodium) .... Qd 2)  Zegerid 40-1100 Mg Caps (Omeprazole-Sodium Bicarbonate) .... One By Mouth Once Daily 3)  Zolpidem Tartrate 10 Mg  Tabs (Zolpidem Tartrate) .... 1/2 or 1 By Mouth At [facility] Prn 4)  Creon 24000 Unit Cpep (Pancrelipase (Lip-Prot-Amyl)) .... Take 1 Capsule By Mouth Three Times A Day Before Meals 5)  Glucosamine Chondr 1500 Complx   Caps (Glucosamine-Chondroit-Vit C-Mn) .... Two Times A Day 6)  Icaps Areds Formula  Tabs (Multiple Vitamins-Minerals) .Marland Kitchen.. 1 Once Daily 7)  Adult Aspirin Low Strength 81 Mg   Tbdp (Aspirin) .... Once Daily 8)  Optive 0.5-0.9 %  Soln (Carboxymethylcellul-Glycerin) .Marland Kitchen.. 1 Gtt Each Eye As Needed 9)  Vitamin B-12 Cr 1000 Mcg  Tbcr (Cyanocobalamin) .... Take One Tablet By Mouth Once Daily 10)  Vitamin D 1000 Unit  Caps (Cholecalciferol) .Marland Kitchen.. 1 By Mouth Daily 11)  Oscal 500/200 D-3 500-200 Mg-Unit  Tabs (Calcium-Vitamin D) .... 3 By Mouth Daily 12)  Gaviscon Extra Strength 160-105 Mg  Chew (Alum Hydroxide-Mag Carbonate) .... 3 By Mouth As Needed 13)  Flaxseed Oil 1000 Mg  Caps (Flaxseed (Linseed)) .Marland Kitchen.. 1 By Mouth Two Times A Day 14)  Cpap .Marland Kitchen.. 12 Cmh20 Qhs 15)  Qvar 40 Mcg/act Aers (Beclomethasone Dipropionate) .... 2 Puffs Two Times A Day 16)  Vitamin C 500 Mg Tabs (Ascorbic Acid) .Marland Kitchen.. 1 By Mouth Daily 17)  Lotemax 0.5 % Susp (Loteprednol Etabonate) .Marland Kitchen.. 1 Drop in Both Eyes Daily As Needed 18)  Ming Mu Abner Greenspan Wan 30gr .... 5 By Mouth Two Times A Day 19)  Meclizine Hcl 12.5 Mg Tabs (Meclizine Hcl) .Marland Kitchen.. 1-2 By Mouth Qid As Needed Dizziness  Allergies (verified): 1)  ! Carafate (Sucralfate) 2)  ! Reglan 3)  ! Afrin Sinus (Oxymetazoline Hcl)  Past History:  Past medical, surgical, family and social histories (including risk factors) reviewed, and no changes noted (except as noted below).  Past Medical History: Reviewed history from 02/12/2008 and  no changes required. 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 Appendectomy 1988 Lumbar disc surgery 1995 ([place]) Multiple knee arthroscopies ([place]) Cholecystectomy 2000 [facility]) Right Carpal Tunnel release (Sypher) 2002 Left hand hemangiom  1997 (Sypher)  Family History: Reviewed history from 08/09/2007 and no changes required. Breast Cancer brain ca in father  Social History: Reviewed history from 03/05/2010 and no changes required. 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  < 1ppd x 17 yrs.  Quit in 1992. No ETOH No caffeine drinks Blood donor q 6 months  Smoking Status:  quit > 6 months  Review of Systems       The patient complains of non-productive cough, sore throat, headaches, and nasal congestion/difficulty breathing through nose.  The patient denies shortness of breath with activity, shortness of breath at rest, productive cough, coughing up blood, chest pain, irregular heartbeats, acid heartburn, indigestion, loss of appetite, weight change, abdominal pain, difficulty swallowing, tooth/dental problems, sneezing, itching, ear ache, anxiety, depression, hand/feet swelling, joint stiffness or pain, rash, change in color of mucus, and fever.    Vital Signs:  Patient profile:   70 year old female Height:      65 inches Weight:      162.50 pounds BMI:     27.14 O2 Sat:      96 % on Room air Temp:     97.7 degrees F oral Pulse rate:   78 / minute BP sitting:   132 / 78  (left arm) Cuff size:   regular  Vitals Entered By: Gweneth Dimitri RN (July 09, 2010 9:43 AM)  O2 Flow:  Room air CC: 6 month follow up.  scratchy throat, PND, chest tightness, occas chest congestion, nonprod cough x 2 wks.   Comments Medications reviewed with patient Daytime contact number verified with patient. Gweneth Dimitri RN  July 09, 2010 9:46 AM    Physical Exam  Additional Exam:  Gen: Pleasant, well nourished, in no distress ENT: mild turbinate edema, creamy mucus in both nares Neck: No JVD, no TMG, no carotid bruits Lungs: no use of accessory muscles, no dullness to percussion, clear without rales or rhonchi Cardiovascular: RRR, heart sounds normal, no murmurs or gallops, no peripheral  edema Musculoskeletal: No deformities, no cyanosis or clubbing     Impression & Recommendations:  Problem # 1:  SINUSITIS- ACUTE-NOS (ICD-461.9) Assessment Deteriorated early acute sinusitis with nasal inflammation. plan zpak nasal saline Her updated medication list for this problem includes:    Azithromycin 250 Mg Tabs (Azithromycin) .Marland Kitchen... Take two by mouth times one dose then one daily until gone  Orders: Est. Patient Level IV ([address])  Medications Added to Medication List This Visit: 1)  Levoxyl 112 Mcg Tabs (Levothyroxine sodium) .... One daily 2)  Vitamin B-12 Cr 1000 Mcg Tbcr (Cyanocobalamin) .... Take one tablet by mouth once daily 3)  Azithromycin 250 Mg Tabs (Azithromycin) .... Take two by mouth times one dose then one daily until gone  Complete Medication List: 1)  Levoxyl 112 Mcg Tabs (Levothyroxine sodium) .... One daily 2)  Zegerid 40-1100 Mg Caps (Omeprazole-sodium bicarbonate) .... One by mouth once daily 3)  Zolpidem Tartrate 10 Mg Tabs (Zolpidem tartrate) .... 1/2 or 1 by mouth  at hs prn 4)  Creon 24000 Unit Cpep (Pancrelipase (lip-prot-amyl)) .... Take 1 capsule by mouth three times a day before meals 5)  Glucosamine Chondr 1500 Complx Caps (Glucosamine-chondroit-vit c-mn) .... Two times a day 6)  Icaps Areds Formula Tabs (Multiple vitamins-minerals) .Marland Kitchen.. 1 once daily 7)  Adult Aspirin Low Strength 81 Mg Tbdp (Aspirin) .... Once daily 8)  Optive 0.5-0.9 % Soln (Carboxymethylcellul-glycerin) .Marland Kitchen.. 1 gtt each eye as needed 9)  Vitamin B-12 Cr 1000 Mcg Tbcr (Cyanocobalamin) .... Take one tablet by mouth once daily 10)  Vitamin D 1000 Unit Caps (Cholecalciferol) .Marland Kitchen.. 1 by mouth daily 11)  Oscal 500/200 D-3 500-200 Mg-unit Tabs (Calcium-vitamin d) .... 3 by mouth daily 12)  Gaviscon Extra Strength 160-105 Mg Chew (Alum hydroxide-mag carbonate) .... 3 by mouth as needed 13)  Flaxseed Oil 1000 Mg Caps (Flaxseed (linseed)) .Marland Kitchen.. 1 by mouth two times a day 14)  Cpap  .Marland Kitchen..  12 cmh20 qhs 15)  Qvar 40 Mcg/act Aers (Beclomethasone dipropionate) .... 2 puffs two times a day 16)  Vitamin C 500 Mg Tabs (Ascorbic acid) .Marland Kitchen.. 1 by mouth daily 17)  Lotemax 0.5 % Susp (Loteprednol etabonate) .Marland Kitchen.. 1 drop in both eyes daily as needed 18)  Ming Mu Abner Greenspan Wan 30gr  .... 5 by mouth two times a day 19)  Meclizine Hcl 12.5 Mg Tabs (Meclizine hcl) .Marland Kitchen.. 1-2 by mouth qid as needed dizziness 20)  Azithromycin 250 Mg Tabs (Azithromycin) .... Take two by mouth times one dose then one daily until gone  Patient Instructions: 1)  Azithromycin 500mg  times one dose then 250mg  daily until gone 2)  No other medication changes, 3)  Use saline nasal spray two sprays each nostril three times daily for 4-5 days 4)  No other medication changes 5)  Return 4 months Prescriptions: AZITHROMYCIN 250 MG TABS (AZITHROMYCIN) Take two by mouth times one dose then one daily until gone  #7 x 0   Entered and Authorized by:   Storm Frisk MD   Signed by:   Storm Frisk MD on 07/09/2010   Method used:   Electronically to        [facility] [phone]* (retail)       [address]       [place], [place]  [address]       Ph: [phone] or [phone]       Fax: [phone]   RxID:   [id number]

## 2010-09-02 NOTE — Assessment & Plan Note (Signed)
Summary: Pulmonary OV   Copy to:  Dr Shan Levans Primary Provider/Referring Provider:  Dr. Sonda Primes  CC:  5 mo follow up.  states breathing is doing well overall.  No complaints. .  History of Present Illness: This is a 70 year old, white female with stage II sarcoidosis.    August 26, 2009 10:36 AM Since last ov 8/10:  10/10 had another spell of  bronchitis got over it on her own with out rx. Currently:  ok,   sl occ cough,  no wheeze,  sl congestion in throat area.  No chest tightness.    Preventive Screening-Counseling & Management  Alcohol-Tobacco     Smoking Status: quit > 6 months     Year Quit: 1992  Current Medications (verified): 1)  Levoxyl 112 Mcg Tabs (Levothyroxine Sodium) .... Qd 2)  Zegerid 40-1100 Mg Caps (Omeprazole-Sodium Bicarbonate) .... One By Mouth Once Daily 3)  Zolpidem Tartrate 10 Mg  Tabs (Zolpidem Tartrate) .... 1/2 or 1 By Mouth At [facility] Prn 4)  Creon 24000 Unit Cpep (Pancrelipase (Lip-Prot-Amyl)) .... Take 1 Capsule By Mouth Three Times A Day Before Meals 5)  Glucosamine Chondr 1500 Complx   Caps (Glucosamine-Chondroit-Vit C-Mn) .... Two Times A Day 6)  Icaps Areds Formula  Tabs (Multiple Vitamins-Minerals) .Marland Kitchen.. 1 Once Daily 7)  Adult Aspirin Low Strength 81 Mg  Tbdp (Aspirin) .... Once Daily 8)  Tramadol Hcl 50 Mg  Tabs (Tramadol Hcl) .... Every Evening As Needed 9)  Optive 0.5-0.9 %  Soln (Carboxymethylcellul-Glycerin) .Marland Kitchen.. 1 Gtt Each Eye As Needed 10)  Vitamin B-12 Cr 1000 Mcg  Tbcr (Cyanocobalamin) .... Take One Tablet By Mouth , Once Weekly 11)  Iron 325 (65 Fe) Mg  Tabs (Ferrous Sulfate) .Marland Kitchen.. 1 By Mouth Once Weekly 12)  Vitamin D 1000 Unit  Caps (Cholecalciferol) .Marland Kitchen.. 1 By Mouth Daily 13)  Oscal 500/200 D-3 500-200 Mg-Unit  Tabs (Calcium-Vitamin D) .... 3 By Mouth Daily 14)  Gaviscon Extra Strength 160-105 Mg  Chew (Alum Hydroxide-Mag Carbonate) .... 3 By Mouth As Needed 15)  Flaxseed Oil 1000 Mg  Caps (Flaxseed (Linseed)) .Marland Kitchen.. 1 By Mouth  Two Times A Day 16)  Cpap .Marland Kitchen.. 12 Cmh20 Qhs 17)  Qvar 40 Mcg/act Aers (Beclomethasone Dipropionate) .... 2 Puffs Two Times A Day 18)  Fexofenadine Hcl 180 Mg Tabs (Fexofenadine Hcl) .Marland Kitchen.. 1 By Mouth Once Daily As Needed Allergies  Allergies (verified): 1)  ! Carafate (Sucralfate) 2)  ! Reglan 3)  ! Afrin Sinus (Oxymetazoline Hcl)  Past History:  Past medical, surgical, family and social histories (including risk factors) reviewed, and no changes noted (except as noted below).  Past Medical History: Reviewed history from 02/12/2008 and no changes required. 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 Appendectomy 1988 Lumbar disc surgery 1995 ([place]) Multiple knee arthroscopies ([place]) Cholecystectomy 2000 [facility]) Right Carpal Tunnel release (S[place]her) 2002 Left hand hemangiom 1997 (S[facility]her)  Family History: Reviewed history from 08/09/2007 and no changes required. 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  The patient denies shortness of breath with activity, shortness of breath at rest, productive cough, coughing up blood, chest pain, irregular heartbeats, acid heartburn, indigestion, loss of appetite, weight change, abdominal pain, difficulty swallowing, sore throat, tooth/dental problems, headaches, nasal congestion/difficulty breathing through nose, sneezing, itching,  ear ache, anxiety, depression, hand/feet swelling, joint stiffness or pain, rash, change in color of mucus, and fever.    Vital Signs:  Patient profile:   70 year old female Height:      65 inches Weight:      173 pounds BMI:     28.89 O2 Sat:      98 % on Room air Temp:     98.0 degrees F oral Pulse rate:   74 / minute BP sitting:   120 / 68  (left arm) Cuff size:   regular  Vitals Entered By: Gweneth Dimitri RN (August 26, 2009 10:19 AM)  O2 Flow:  Room air CC: 5 mo follow up.  states breathing is doing well overall.  No complaints.  Comments Medications reviewed with patient Daytime contact number verified with patient. Crystal Jones RN  August 26, 2009 10:20 AM    Physical Exam  Additional Exam:  Gen: Pleasant, well nourished, in no distress ENT: no lesions, no postnasal drip Neck: No JVD, no TMG, no carotid bruits Lungs: no use of accessory muscles, no dullness to percussion, clear without rales or rhonchi Cardiovascular: RRR, heart sounds normal, no murmurs or gallops, no peripheral edema Musculoskeletal: No deformities, no cyanosis or clubbing     Impression & Recommendations:  Problem # 1:  SARCOIDOSIS, PULMONARY (ICD-135) Assessment Unchanged  Pulmonary Sarcoidosis with airway involvement now improved plan flu vaccine today cont inhaled meds as prescribed  Medications Added to Medication List This Visit: 1)  Creon 24000 Unit Cpep (Pancrelipase (lip-prot-amyl)) .... Take 1 capsule by mouth three times a day before meals  Complete Medication List: 1)  Levoxyl 112 Mcg Tabs (Levothyroxine sodium) .... Qd 2)  Zegerid 40-1100 Mg Caps (Omeprazole-sodium bicarbonate) .... One by mouth once daily 3)  Zolpidem Tartrate 10 Mg Tabs (Zolpidem tartrate) .... 1/2 or 1 by mouth at hs prn 4)  Creon 24000 Unit Cpep (Pancrelipase (lip-prot-amyl)) .... Take 1 capsule by mouth three times a day before meals 5)  Glucosamine Chondr 1500 Complx Caps (Glucosamine-chondroit-vit  c-mn) .... Two times a day 6)  Icaps Areds Formula Tabs (Multiple vitamins-minerals) .Marland Kitchen.. 1 once daily 7)  Adult Aspirin Low Strength 81 Mg Tbdp (Aspirin) .... Once daily 8)  Tramadol Hcl 50 Mg Tabs (Tramadol hcl) .... Every evening as needed 9)  Optive 0.5-0.9 % Soln (Carboxymethylcellul-glycerin) .Marland Kitchen.. 1 gtt each eye as needed 10)  Vitamin B-12 Cr 1000 Mcg Tbcr (Cyanocobalamin) .... Take one tablet by mouth , once weekly 11)  Iron 325 (65 Fe) Mg Tabs (Ferrous sulfate) .Marland Kitchen.. 1 by mouth once weekly 12)  Vitamin D 1000 Unit Caps (Cholecalciferol) .Marland Kitchen.. 1 by mouth daily 13)  Oscal 500/200 D-3 500-200 Mg-unit Tabs (Calcium-vitamin d) .... 3 by mouth daily 14)  Gaviscon Extra Strength 160-105 Mg Chew (Alum hydroxide-mag carbonate) .... 3 by mouth as needed 15)  Flaxseed Oil 1000 Mg Caps (Flaxseed (linseed)) .Marland Kitchen.. 1 by mouth two times a day 16)  Cpap  .Marland Kitchen.. 12 cmh20 qhs 17)  Qvar 40 Mcg/act Aers (Beclomethasone dipropionate) .... 2 puffs two times a day 18)  Fexofenadine Hcl 180 Mg Tabs (Fexofenadine hcl) .Marland Kitchen.. 1 by mouth once daily as needed allergies  Other Orders:  Est. Patient Level III ([address]) Prescription Created Electronically [phone]) Influenza Vaccine MCR [phone])  Patient Instructions: 1)  No change in medications 2)  Flu vaccine today 3)  Return 5-6 months Prescriptions: QVAR 40 MCG/ACT AERS (BECLOMETHASONE DIPROPIONATE) 2 puffs two times a day  #1 x 6   Entered and Authorized by:   Storm Frisk MD   Signed by:   Storm Frisk MD on 08/26/2009   Method used:   Electronically to        [facility] [phone]* (retail)       [address]       [place], [place]  [address]       Ph: [phone] or [phone]       Fax: [phone]   RxID:   [id number]    Immunizations Administered:  Influenza Vaccine # 1:    Vaccine Type: Fluvax MCR    Site: left deltoid    Mfr: GlaxoSmithKline    Dose: 0.5 ml    Route: IM    Given by: Michel Bickers  CMA    Exp. Date: 01/28/2010    Lot #: [id number]    VIS given: 02/22/07 version given August 26, 2009.  Flu Vaccine Consent Questions:    Do you have a history of severe allergic reactions to this vaccine? no    Any prior history of allergic reactions to egg and/or gelatin? no    Do you have a sensitivity to the preservative Thimersol? no    Do you have a past history of Guillan-Barre Syndrome? no    Do you currently have an acute febrile illness? no    Have you ever had a severe reaction to latex? no    Vaccine information given and explained to patient? yes    Are you currently pregnant? no    Prevention & Chronic Care Immunizations   Influenza vaccine: Fluvax MCR  (08/26/2009)    Tetanus booster: Not documented    Pneumococcal vaccine: Pneumovax  (03/06/2009)    H. zoster vaccine: 03/17/2006: Zostavax  Colorectal Screening   Hemoccult: Not documented    Colonoscopy: Results: Hemorrhoids.     Pathology:  Hyperplastic polyp.     Location:  [facility].    (12/19/2006)   Colonoscopy due: 12/2011  Other Screening   Pap smear: Not documented    Mammogram: Not documented    DXA bone density scan: Not documented   Smoking status: quit > 6 months  (08/26/2009)  Lipids   Total Cholesterol: 147  (05/07/2007)   LDL: 79  (05/07/2007)   LDL Direct: Not documented   HDL: 49.8  (05/07/2007)   Triglycerides: 91  (05/07/2007)   Nursing Instructions: Give Flu vaccine today

## 2010-09-02 NOTE — Miscellaneous (Signed)
Summary: Orders Update pft charges  Clinical Lists Changes  Orders: Added new Service order of Carbon Monoxide diffusing w/capacity (94720) - Signed Added new Service order of Lung Volumes (94240) - Signed Added new Service order of Spirometry (Pre & Post) (94060) - Signed 

## 2010-09-02 NOTE — Letter (Signed)
Summary: CMN for CPAP Supplies/[facility]  CMN for CPAP Supplies/[facility]   Imported By: Sherian Rein 12/18/2009 14:45:35  _____________________________________________________________________  External Attachment:    Type:   Image     Comment:   External Document

## 2010-09-02 NOTE — Progress Notes (Signed)
Summary: REQ FOR Rx  Phone Note Call from Patient   Summary of Call: Pt req rx for her thumb & index finger. She is c/o painful skin splitting. Per pt, her friend has a "really great cream" call fluocinonide and req rx for same.  Initial call taken by: Lamar Sprinkles, CMA,  July 21, 2010 11:00 AM  Follow-up for Phone Call        ok Follow-up by: Tresa Garter MD,  July 22, 2010 1:05 PM  Additional Follow-up for Phone Call Additional follow up Details #1::        Pt informed, left vm on hm # Additional Follow-up by: Lamar Sprinkles, CMA,  July 22, 2010 2:47 PM    New/Updated Medications: FLUOCINOLONE ACETONIDE 0.025 % CREA (FLUOCINOLONE ACETONIDE) use bid Prescriptions: FLUOCINOLONE ACETONIDE 0.025 % CREA (FLUOCINOLONE ACETONIDE) use bid  #45 g x 1   Entered and Authorized by:   Tresa Garter MD   Signed by:   Lamar Sprinkles, CMA on 07/22/2010   Method used:   [facility] to        [facility] [phone]* (retail)       [address]       [place], [place]  [address]       Ph: [phone] or [phone]       Fax: [phone]   RxID:   [id number]

## 2010-09-02 NOTE — Progress Notes (Signed)
Summary: REFILL  Phone Note Refill Request   Refills Requested: Medication #1:  ZOLPIDEM TARTRATE 10 MG  TABS 1/2 or 1 by mouth at hs prn Last refill advised f/u 3 mth. Pt has apt in August. Please adivse.   Initial call taken by: Lamar Sprinkles, CMA,  January 29, 2010 1:23 PM  Follow-up for Phone Call        OK 3 ref Follow-up by: Tresa Garter MD,  January 29, 2010 4:18 PM    Prescriptions: ZOLPIDEM TARTRATE 10 MG  TABS (ZOLPIDEM TARTRATE) 1/2 or 1 by mouth at hs prn  #90 x 0   Entered by:   Lamar Sprinkles, CMA   Authorized by:   Tresa Garter MD   Signed by:   Lamar Sprinkles, CMA on 01/29/2010   Method used:   Telephoned to ...       [facility]  [address] [phone][facility]       [address]       [place], [place]  [address]       Ph: [phone] or [phone]       Fax: [phone]   RxID:   [id number]

## 2010-09-02 NOTE — Letter (Signed)
Summary: Statement of Medical Necessity/ [facility]  Statement of Medical Necessity/ [facility]   Imported By: Lennie Odor 12/02/2009 09:35:37  _____________________________________________________________________  External Attachment:    Type:   Image     Comment:   External Document

## 2010-09-02 NOTE — Progress Notes (Signed)
Summary: test results  Phone Note Call from Patient Call back at Home Phone [phone] Call back at [phone]   Summary of Call: Patient called for test results and would like a copy mailed to her. Please advise. Initial call taken by: Lucious Groves CMA,  April 01, 2010 3:01 PM  Follow-up for Phone Call        OK - is it from 8/5? Please, mail a copy of all test results from this date  to the patient.  RTC 6 months with BMET and TSH 244.80  Follow-up by: Tresa Garter MD,  April 02, 2010 12:49 PM  Additional Follow-up for Phone Call Additional follow up Details #1::        Yes, labs from 8/5. Any changes to patient's meds or further reccomendations? All normal?  Additional Follow-up by: Lamar Sprinkles, CMA,  April 02, 2010 4:25 PM    Additional Follow-up for Phone Call Additional follow up Details #2::    The potass was a little up and TSH - thyroid test was a little down. No action needed: will recheck in 5 months  Follow-up by: Tresa Garter MD,  April 02, 2010 5:38 PM  Additional Follow-up for Phone Call Additional follow up Details #3:: Details for Additional Follow-up Action Taken: Pt informed, labs mailed Additional Follow-up by: Lamar Sprinkles, CMA,  April 07, 2010 12:42 PM

## 2010-09-02 NOTE — Assessment & Plan Note (Signed)
Summary: yearly f/u medicare/will come fasting for labs/#/cd   Vital Signs:  Patient profile:   70 year old female Height:      65 inches Weight:      155 pounds BMI:     25.89 O2 Sat:      94 % on Room air Temp:     97.7 degrees F oral Pulse rate:   74 / minute Pulse rhythm:   regular Resp:     16 per minute BP sitting:   120 / 70  (left arm) Cuff size:   regular  Vitals Entered By: Lanier Prude, CMA(AAMA) (March 05, 2010 8:28 AM)  O2 Flow:  Room air CC: CPX Is Patient Diabetic? No Comments pt is not taking Vit b12 or Iron supp.   She is requesting new rx for Phentermine 37.5mg  1 two times a day    Primary Care Rayley Gao:  Dr. Sonda Primes  CC:  CPX.  History of Present Illness: The patient presents for a preventive health examination  Patient past medical history, social history, and family history reviewed in detail no significant changes.  Patient is physically active. Depression is negative and mood is good. Hearing is normal, and able to perform activities of daily living. Risk of falling is negligible and home safety has been reviewed and is appropriate. Patient has normal height, weight, and visual acuity. Patient has been counseled on age-appropriate routine health concerns for screening and prevention. Education, counseling done.  The patient presents for a follow up of sarcoidosis, hypothyroidism, Vit B12 def She has been on Phentermine x 3 months  C/o dizziness off and on x wks  Preventive Screening-Counseling & Management  Alcohol-Tobacco     Alcohol drinks/day: 0     Smoking Status: never  Caffeine-Diet-Exercise     Caffeine Counseling: not indicated; caffeine use is not excessive or problematic     Diet Counseling: not indicated; diet is assessed to be healthy     Does Patient Exercise: no     Exercise Counseling: to improve exercise regimen     Depression Counseling: not indicated; screening negative for depression  Hep-HIV-STD-Contraception  Hepatitis Risk: no risk noted     Sun Exposure-Excessive: no  Safety-Violence-Falls     Seat Belt Use: yes     Violence in the Home: no risk noted      Sexual History:  currently monogamous.        Drug Use:  never.        Blood Transfusions:  no.    Current Medications (verified): 1)  Levoxyl 112 Mcg Tabs (Levothyroxine Sodium) .... Qd 2)  Zegerid 40-1100 Mg Caps (Omeprazole-Sodium Bicarbonate) .... One By Mouth Once Daily 3)  Zolpidem Tartrate 10 Mg  Tabs (Zolpidem Tartrate) .... 1/2 or 1 By Mouth At [facility] Prn 4)  Creon 24000 Unit Cpep (Pancrelipase (Lip-Prot-Amyl)) .... Take 1 Capsule By Mouth Three Times A Day Before Meals 5)  Glucosamine Chondr 1500 Complx   Caps (Glucosamine-Chondroit-Vit C-Mn) .... Two Times A Day 6)  Icaps Areds Formula  Tabs (Multiple Vitamins-Minerals) .Marland Kitchen.. 1 Once Daily 7)  Adult Aspirin Low Strength 81 Mg  Tbdp (Aspirin) .... Once Daily 8)  Optive 0.5-0.9 %  Soln (Carboxymethylcellul-Glycerin) .Marland Kitchen.. 1 Gtt Each Eye As Needed 9)  Vitamin B-12 Cr 1000 Mcg  Tbcr (Cyanocobalamin) .... Take One Tablet By Mouth , Once Weekly 10)  Iron 325 (65 Fe) Mg  Tabs (Ferrous Sulfate) .Marland Kitchen.. 1 By Mouth Once Weekly 11)  Vitamin D  1000 Unit  Caps (Cholecalciferol) .Marland Kitchen.. 1 By Mouth Daily 12)  Oscal 500/200 D-3 500-200 Mg-Unit  Tabs (Calcium-Vitamin D) .... 3 By Mouth Daily 13)  Gaviscon Extra Strength 160-105 Mg  Chew (Alum Hydroxide-Mag Carbonate) .... 3 By Mouth As Needed 14)  Flaxseed Oil 1000 Mg  Caps (Flaxseed (Linseed)) .Marland Kitchen.. 1 By Mouth Two Times A Day 15)  Cpap .Marland Kitchen.. 12 Cmh20 Qhs 16)  Qvar 40 Mcg/act Aers (Beclomethasone Dipropionate) .... 2 Puffs Two Times A Day 17)  Fexofenadine Hcl 180 Mg Tabs (Fexofenadine Hcl) .Marland Kitchen.. 1 By Mouth Daily 18)  Vitamin C 500 Mg Tabs (Ascorbic Acid) .Marland Kitchen.. 1 By Mouth Daily 19)  Lotemax 0.5 % Susp (Loteprednol Etabonate) .Marland Kitchen.. 1 Drop in Both Eyes Daily As Needed 20)  Ming Mu Abner Greenspan Wan 30gr .... 5 By Mouth Two Times A Day  Allergies (verified): 1)   ! Carafate (Sucralfate) 2)  ! Reglan 3)  ! Afrin Sinus (Oxymetazoline Hcl)  Past History:  Past Medical History: Last updated: 02/12/2008 Current Problems:  INSOMNIA-SLEEP DISORDER-UNSPEC (ICD-307.40) FATIGUE (ICD-780.79) SARCOIDOSIS, PULMONARY (ICD-135) stage II HYPOTHYROIDISM (ICD-244.9) GERD (ICD-530.81) COLONIC POLYPS, HX OF (ICD-V12.72) ANEMIA-IRON DEFICIENCY (ICD-280.9) ALLERGIC RHINITIS (ICD-477.9) Pancreatitis, hx of Polio (1948) VIt D def Vit B12 def  Past Surgical History: Last updated: 09/06/2007 Appendectomy 1988 Lumbar disc surgery 1995 ([place]) Multiple knee arthroscopies ([place]) Cholecystectomy 2000 [facility]) Right Carpal Tunnel release ([place]) 2002 Left hand hemangiom 1997 ([place])  Family History: Last updated: 08/09/2007 Breast Cancer brain ca in father  Family History: Reviewed history from 08/09/2007 and no changes required. Breast Cancer brain ca in father  Social History: Psychologist, forensic working 3 d per wk now Married Never Smoked No ETOH No caffeine drinks Blood donor q 6 months  Smoking Status:  never Does Patient Exercise:  no Hepatitis Risk:  no risk noted Sun Exposure-Excessive:  no Seat Belt Use:  yes Sexual History:  currently monogamous Drug Use:  never Blood Transfusions:  no  Review of Systems       Lost 16 lbs  Physical Exam  General:  Well developed, well nourished, no acute distress. Head:  Normocephalic and atraumatic without obvious abnormalities. No apparent alopecia or balding. Eyes:  No corneal or conjunctival inflammation noted. EOMI. Perrla. Ears:  External ear exam shows no significant lesions or deformities.  Otoscopic examination reveals clear canals, tympanic membranes are intact bilaterally without bulging, retraction, inflammation or discharge. Hearing is grossly normal bilaterally. Nose:  External nasal examination shows no deformity or inflammation. Nasal mucosa are pink and moist without lesions  or exudates. Mouth:  Oral mucosa and oropharynx without lesions or exudates.  Teeth in good repair. Neck:  supple, full ROM, no masses, and no thyromegaly.   Lungs:  Clear throughout to auscultation. Heart:  Regular rate and rhythm; no murmurs, rubs,  or bruits. Abdomen:  soft and nontender without mass or HSM BS+ Msk:  normal ROM, no joint tenderness, and no joint swelling.   Pulses:  R and L carotid,radial,femoral,dorsalis pedis and posterior tibial pulses are full and equal bilaterally Extremities:  No clubbing, cyanosis, edema, or deformity noted with normal full range of motion of all joints.   Neurologic:  No cranial nerve deficits noted. Station and gait are normal. Plantar reflexes are down-going bilaterally. DTRs are symmetrical throughout. Sensory, motor and coordinative functions appear intact. H-P (+) on R Skin:  turgor normal, color normal, no rashes, no suspicious lesions, no ecchymoses, no petechiae, no purpura, no ulcerations, and no edema.  Cervical Nodes:  No lymphadenopathy noted Inguinal Nodes:  No significant adenopathy Psych:  Cognition and judgment appear intact. Alert and cooperative with normal attention span and concentration. No apparent delusions, illusions, hallucinations   Impression & Recommendations:  Problem # 1:  Preventive Health Care (ICD-V70.0) Assessment New  Overall doing well, age appropriate education and counseling updated and referral for appropriate preventive services done unless declined, immunizations up to date or declined, diet counseling done if overweight, urged to quit smoking if smokes, most recent labs reviewed and current ordered if appropriate, ecg reviewed or declined (interpretation per ECG scanned in the EMR if done); information regarding Medicare Preventation requirements given if appropriate.   Orders: TLB-BMP (Basic Metabolic Panel-BMET) (80048-METABOL) TLB-CBC Platelet - w/Differential (85025-CBCD) TLB-Hepatic/Liver Function  Pnl (80076-HEPATIC) TLB-Lipid Panel (80061-LIPID) TLB-TSH (Thyroid Stimulating Hormone) (84443-TSH) TLB-Udip ONLY (81003-UDIP) First annual wellness visit with prevention plan  (R6045)  Problem # 2:  SARCOIDOSIS, PULMONARY (ICD-135) Assessment: Improved  Problem # 3:  FATIGUE (ICD-780.79) Assessment: Unchanged  Problem # 4:  B12 DEFICIENCY (ICD-266.2) Assessment: Improved  Orders: TLB-B12, Serum-Total ONLY (40981-X91)  Problem # 5:  VERTIGO (ICD-780.4) BPV Assessment: Allayne Stack - Daroff exercise was given to the patient  Her updated medication list for this problem includes:    Fexofenadine Hcl 180 Mg Tabs (Fexofenadine hcl) .Marland Kitchen... 1 by mouth daily    Meclizine Hcl 12.5 Mg Tabs (Meclizine hcl) .Marland Kitchen... 1-2 by mouth qid as needed dizziness  Problem # 6:  VITAMIN D DEFICIENCY (ICD-268.9) Assessment: Improved  Orders: T-Vitamin D (25-Hydroxy) (47829-56213)  Problem # 7:  HYPOTHYROIDISM (ICD-244.9) Assessment: Unchanged  Her updated medication list for this problem includes:    Levoxyl 112 Mcg Tabs (Levothyroxine sodium) ..... Qd  Complete Medication List: 1)  Levoxyl 112 Mcg Tabs (Levothyroxine sodium) .... Qd 2)  Zegerid 40-1100 Mg Caps (Omeprazole-sodium bicarbonate) .... One by mouth once daily 3)  Zolpidem Tartrate 10 Mg Tabs (Zolpidem tartrate) .... 1/2 or 1 by mouth at hs prn 4)  Creon 24000 Unit Cpep (Pancrelipase (lip-prot-amyl)) .... Take 1 capsule by mouth three times a day before meals 5)  Glucosamine Chondr 1500 Complx Caps (Glucosamine-chondroit-vit c-mn) .... Two times a day 6)  Icaps Areds Formula Tabs (Multiple vitamins-minerals) .Marland Kitchen.. 1 once daily 7)  Adult Aspirin Low Strength 81 Mg Tbdp (Aspirin) .... Once daily 8)  Optive 0.5-0.9 % Soln (Carboxymethylcellul-glycerin) .Marland Kitchen.. 1 gtt each eye as needed 9)  Vitamin B-12 Cr 1000 Mcg Tbcr (Cyanocobalamin) .... Take one tablet by mouth , once weekly 10)  Iron 325 (65 Fe) Mg Tabs (Ferrous sulfate) .Marland Kitchen.. 1 by mouth once  weekly 11)  Vitamin D 1000 Unit Caps (Cholecalciferol) .Marland Kitchen.. 1 by mouth daily 12)  Oscal 500/200 D-3 500-200 Mg-unit Tabs (Calcium-vitamin d) .... 3 by mouth daily 13)  Gaviscon Extra Strength 160-105 Mg Chew (Alum hydroxide-mag carbonate) .... 3 by mouth as needed 14)  Flaxseed Oil 1000 Mg Caps (Flaxseed (linseed)) .Marland Kitchen.. 1 by mouth two times a day 15)  Cpap  .Marland Kitchen.. 12 cmh20 qhs 16)  Qvar 40 Mcg/act Aers (Beclomethasone dipropionate) .... 2 puffs two times a day 17)  Fexofenadine Hcl 180 Mg Tabs (Fexofenadine hcl) .Marland Kitchen.. 1 by mouth daily 18)  Vitamin C 500 Mg Tabs (Ascorbic acid) .Marland Kitchen.. 1 by mouth daily 19)  Lotemax 0.5 % Susp (Loteprednol etabonate) .Marland Kitchen.. 1 drop in both eyes daily as needed 20)  Ming Mu Abner Greenspan Wan 30gr  .... 5 by mouth two times a day 21)  Phentermine Hcl  37.5 Mg Tabs (Phentermine hcl) .Marland Kitchen.. 1 by mouth qam 22)  Meclizine Hcl 12.5 Mg Tabs (Meclizine hcl) .Marland Kitchen.. 1-2 by mouth qid as needed dizziness 23)  Zithromax Z-pak 250 Mg Tabs (Azithromycin) .... As dirrected  Other Orders: EKG w/ Interpretation (93000) Tdap => 40yrs IM ([address]) Admin 1st Vaccine (60454)  Patient Instructions: 1)  Francee Piccolo - Daroff exercise was given to you 2)  Please schedule a follow-up appointment in 6 months. Prescriptions: ZITHROMAX Z-PAK 250 MG TABS (AZITHROMYCIN) as dirrected  #1 x 0   Entered and Authorized by:   Tresa Garter MD   Signed by:   Tresa Garter MD on 03/05/2010   Method used:   Print then Give to Patient   RxID:   [id number] MECLIZINE HCL 12.5 MG TABS (MECLIZINE HCL) 1-2 by mouth qid as needed dizziness  #60 x 1   Entered and Authorized by:   Tresa Garter MD   Signed by:   Tresa Garter MD on 03/05/2010   Method used:   Print then Give to Patient   RxID:   [id number] PHENTERMINE HCL 37.5 MG TABS (PHENTERMINE HCL) 1 by mouth qam  #30 x 2   Entered and Authorized by:   Tresa Garter MD   Signed by:   Tresa Garter MD on 03/05/2010   Method  used:   Print then Give to Patient   RxID:   [id number]    Immunizations Administered:  Tetanus Vaccine:    Vaccine Type: Tdap    Site: left deltoid    Mfr: GlaxoSmithKline    Dose: 0.5 ml    Route: IM    Given by: Lanier Prude, CMA(AAMA)    Exp. Date: 01/29/2012    Lot #: [id number]    VIS given: 06/19/07 version given March 05, 2010.

## 2010-09-02 NOTE — Progress Notes (Signed)
Summary: Medication change  Phone Note Call from Patient Call back at Home Phone [phone]   Caller: Patient Summary of Call: pt called requesting med change from Fexofenadine to Xyzal, ok? Initial call taken by: Sydell Axon,  October 07, 2009 10:27 AM  Follow-up for Phone Call        ok Follow-up by: Tresa Garter MD,  October 07, 2009 1:25 PM  Additional Follow-up for Phone Call Additional follow up Details #1::        Pt informed, she tells me that her husband recieved the same rx and has the same insurance and it was covered. Pharm is getting a rejection, says PA is needed but they will look into tomorrow with insurance and call office if anything is needed.  Additional Follow-up by: Lamar Sprinkles, CMA,  October 07, 2009 6:03 PM    New/Updated Medications: XYZAL 5 MG TABS (LEVOCETIRIZINE DIHYDROCHLORIDE) 1 by mouth once daily as needed allergy Prescriptions: XYZAL 5 MG TABS (LEVOCETIRIZINE DIHYDROCHLORIDE) 1 by mouth once daily as needed allergy  #90 x 3   Entered and Authorized by:   Tresa Garter MD   Signed by:   Lamar Sprinkles, CMA on 10/07/2009   Method used:   [facility] to        [facility] [phone][facility] (retail)       [address]       [place], [place]  [address]       Ph: [phone] or [phone]       Fax: [phone]   RxID:   [id number]

## 2010-09-02 NOTE — Assessment & Plan Note (Signed)
Summary: 12 months/apc   Visit Type:  Follow-up Copy to:  Dr Shan Levans Primary Provider/Referring Provider:  Dr. Sonda Primes  CC:  OSA. 1 year follow-up. The patient says she is doing well on CPAP. No complaints..  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 Epworth Sleepiness Score was 12/24  Bedtime 10 p, wakes up at 0430 am to giv eher husband a pill & then sleeps until 0530am.  PSG 11/09 showed predominantly REM related obstructive sleep apnea , correctd by CPAP 12 cm. Prolonged sleep latency inspite of ambien.Few PLMs esp at sleep onset. PLM arousal index was only 1.9/h >> doubt this is causing her tiredness.  download 12/1-28 /09 >> good compliance, 12 cm   January 25, 2010 9:39 AM  mask ok, pressure OK,  she now wakes up at 5 a to give her husband meds denies excessive daytime somnolence    Current Medications (verified): 1)  Levoxyl 112 Mcg Tabs (Levothyroxine Sodium) .... Qd 2)  Zegerid 40-1100 Mg Caps (Omeprazole-Sodium Bicarbonate) .... One By Mouth Once Daily 3)  Zolpidem Tartrate 10 Mg  Tabs (Zolpidem Tartrate) .... 1/2 or 1 By Mouth At [facility] Prn 4)  Creon 24000 Unit Cpep (Pancrelipase (Lip-Prot-Amyl)) .... Take 1 Capsule By Mouth Three Times A Day Before Meals 5)  Glucosamine Chondr 1500 Complx   Caps (Glucosamine-Chondroit-Vit C-Mn) .... Two Times A Day 6)  Icaps Areds Formula  Tabs (Multiple Vitamins-Minerals) .Marland Kitchen.. 1 Once Daily 7)  Adult Aspirin Low Strength 81 Mg  Tbdp (Aspirin) .... Once Daily 8)  Optive 0.5-0.9 %  Soln (Carboxymethylcellul-Glycerin) .Marland Kitchen.. 1 Gtt Each Eye As Needed 9)  Vitamin B-12 Cr 1000 Mcg  Tbcr (Cyanocobalamin) .... Take One Tablet By Mouth , Once Weekly 10)  Iron 325 (65 Fe) Mg  Tabs (Ferrous Sulfate) .Marland Kitchen.. 1 By Mouth Once Weekly 11)  Vitamin D 1000  Unit  Caps (Cholecalciferol) .Marland Kitchen.. 1 By Mouth Daily 12)  Oscal 500/200 D-3 500-200 Mg-Unit  Tabs (Calcium-Vitamin D) .... 3 By Mouth Daily 13)  Gaviscon Extra Strength 160-105 Mg  Chew (Alum Hydroxide-Mag Carbonate) .... 3 By Mouth As Needed 14)  Flaxseed Oil 1000 Mg  Caps (Flaxseed (Linseed)) .Marland Kitchen.. 1 By Mouth Two Times A Day 15)  Cpap .Marland Kitchen.. 12 Cmh20 Qhs 16)  Qvar 40 Mcg/act Aers (Beclomethasone Dipropionate) .... 2 Puffs Two Times A Day 17)  Fexofenadine Hcl 180 Mg Tabs (Fexofenadine Hcl) .Marland Kitchen.. 1 By Mouth Daily 18)  Vitamin C 500 Mg Tabs (Ascorbic Acid) .Marland Kitchen.. 1 By Mouth Daily 19)  Lotemax 0.5 % Susp (Loteprednol Etabonate) .Marland Kitchen.. 1 Drop in Both Eyes Daily As Needed 20)  Ming Mu Abner Greenspan Wan 30gr .... 5 By Mouth Two Times A Day  Allergies (verified): 1)  ! Carafate (Sucralfate) 2)  ! Reglan 3)  ! 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    Vital Signs:  Patient profile:   70 year old female Height:      65 inches (165.10 cm) Weight:      160 pounds (72.73 kg) BMI:     26.72 O2  Sat:      98 % on Room air Temp:     97.4 degrees F (36.33 degrees C) oral Pulse rate:   79 / minute BP sitting:   118 / 72  (left arm) Cuff size:   regular  Vitals Entered By: Michel Bickers CMA (January 25, 2010 9:26 AM)  O2 Sat at Rest %:  98 O2 Flow:  Room air CC: OSA. 1 year follow-up. The patient says she is doing well on CPAP. No complaints. Comments Medications reviewed. Daytime phone verified. Michel Bickers CMA  January 25, 2010 9:27 AM   Physical Exam  Additional Exam:  Gen: Pleasant, well nourished, in no distress ENT: no lesions, no postnasal drip Neck: No JVD, no TMG, no carotid bruits Lungs: no use of accessory muscles, no dullness to percussion, clear without rales or rhonchi Cardiovascular: RRR, heart sounds normal, no murmurs or gallops, no peripheral edema Musculoskeletal: No deformities, no cyanosis or clubbing     Impression & Recommendations:  Problem # 1:  OBSTRUCTIVE SLEEP APNEA (ICD-780.57)  Otherwise compliant with her CPAP & has had good results. The pathophysiology of obstructive sleep apnea, it's cardiovascular consequences and modes of treatment including CPAP were discussed with the patient in great detail.  Compliance encouraged, wt loss emphasized, asked to avoid meds with sedative side effects, cautioned against driving when sleepy.   Orders: Est. Patient Level III ([address])  Medications Added to Medication List This Visit: 1)  Fexofenadine Hcl 180 Mg Tabs (Fexofenadine hcl) .Marland Kitchen.. 1 by mouth daily 2)  Vitamin C 500 Mg Tabs (Ascorbic acid) .Marland Kitchen.. 1 by mouth daily 3)  Lotemax 0.5 % Susp (Loteprednol etabonate) .Marland Kitchen.. 1 drop in both eyes daily as needed 4)  Ming Mu Abner Greenspan Wan 30gr  .... 5 by mouth two times a day  Patient Instructions: 1)  Please schedule a follow-up appointment in 1 year. 2)  Stay on your CPAP machine

## 2010-09-02 NOTE — Miscellaneous (Signed)
Summary: PFTs   Pulmonary Function Test Date: 02/23/2010 Height (in.): 64 Gender: Female  Pre-Spirometry FVC    Value: 3.07 L/min   Pred: 2.80 L/min     % Pred: 110 % FEV1    Value: 2.37 L     Pred: 1.99 L     % Pred: 119 % FEV1/FVC  Value: 77 %     Pred: 71 %    FEF 25-75  Value: 2.07 L/min   Pred: 2.26 L/min     % Pred: 92 %  Post-Spirometry FVC    Value: 3.09 L/min   Pred: 2.80 L/min     % Pred: 110 % FEV1    Value: 2.49 L     Pred: 1.99 L     % Pred: 125 % FEV1/FVC  Value: 81 %     Pred: 71 %    FEF 25-75  Value: 2.62 L/min   Pred: 2.26 L/min     % Pred: 116 %  Lung Volumes TLC    Value: 6.54 L   % Pred: 136 % RV    Value: 3.44 L   % Pred: 177 % DLCO    Value: 17.4 %   % Pred: 88 % DLCO/VA  Value: 3.90 %   % Pred: 112 %  Comments: Normal spirometry, lung volumes and DLCO  Evaluation: normal  Clinical Lists Changes  Observations: Added new observation of PFT RSLT: normal (02/24/2010 11:10) Added new observation of PFT COMMENTS: Normal spirometry, lung volumes and DLCO (02/24/2010 11:10) Added new observation of DLCO/VA%EXP: 112 % (02/24/2010 11:10) Added new observation of DLCO/VA: 3.90 % (02/24/2010 11:10) Added new observation of DLCO % EXPEC: 88 % (02/24/2010 11:10) Added new observation of DLCO: 17.4 % (02/24/2010 11:10) Added new observation of RV % EXPECT: 177 % (02/24/2010 11:10) Added new observation of RV: 3.44 L (02/24/2010 11:10) Added new observation of TLC % EXPECT: 136 % (02/24/2010 11:10) Added new observation of TLC: 6.54 L (02/24/2010 11:10) Added new observation of FEF2575%EXPS: 116 % (02/24/2010 11:10) Added new observation of PSTFEF25/75P: 2.26  (02/24/2010 11:10) Added new observation of PSTFEF25/75%: 2.62 L/min (02/24/2010 11:10) Added new observation of FEV1FVCPRDPS: 71 % (02/24/2010 11:10) Added new observation of PSTFEV1/FVC: 81 % (02/24/2010 11:10) Added new observation of POSTFEV1%PRD: 125 % (02/24/2010 11:10) Added new observation of  FEV1PRDPST: 1.99 L (02/24/2010 11:10) Added new observation of POST FEV1: 2.49 L/min (02/24/2010 11:10) Added new observation of POST FVC%EXP: 110 % (02/24/2010 11:10) Added new observation of FVCPRDPST: 2.80 L/min (02/24/2010 11:10) Added new observation of POST FVC: 3.09 L (02/24/2010 11:10) Added new observation of FEF % EXPEC: 92 % (02/24/2010 11:10) Added new observation of FEF25-75%PRE: 2.26 L/min (02/24/2010 11:10) Added new observation of FEF 25-75%: 2.07 L/min (02/24/2010 11:10) Added new observation of FEV1/FVC PRE: 71 % (02/24/2010 11:10) Added new observation of FEV1/FVC: 77 % (02/24/2010 11:10) Added new observation of FEV1 % EXP: 119 % (02/24/2010 11:10) Added new observation of FEV1 PREDICT: 1.99 L (02/24/2010 11:10) Added new observation of FEV1: 2.37 L (02/24/2010 11:10) Added new observation of FVC % EXPECT: 110 % (02/24/2010 11:10) Added new observation of FVC PREDICT: 2.80 L (02/24/2010 11:10) Added new observation of FVC: 3.07 L (02/24/2010 11:10) Added new observation of PFT HEIGHT: 64  (02/24/2010 11:10) Added new observation of PFT DATE: 02/23/2010  (02/24/2010 11:10)  Appended Document: PFTs result noted  patient aware

## 2010-09-02 NOTE — Progress Notes (Signed)
Summary: sinus infection  Phone Note Call from Patient Call back at Home Phone [phone]   Caller: Patient Call For: Dr Delford Field Summary of Call: Patient phoned she was seen by Dr. Delford Field recently and he gave her a prescription for zithromiocin for a sinus infection and didnt seem to help if got a little better and she has been off of it for about a week and she is still having congestion, headache and sinus symptoms. Wants to know if [facility] [place]. Patient can be reached on her cell [phone] Initial call taken by: Vedia Coffer,  July 21, 2010 9:52 AM  Follow-up for Phone Call        Called, spoke with pt.  She was given azithromycin on 07/09/10 by PW and has finished this.  States she is some better but not completly better.  Still having HA, head congestion, dry cough, slight scratchy throat, PND, and some SOB "all the time."  Low grade fever 99.6-99.8 x 4-5 days ago.  Denies wheezing and chest tightness.    Pt did make an appt for tomorrow with TP but prefers not to come in unless necessary.  Requesting I send this to RA to address bc she sees him for sleep. Dr. Vassie Loll, pls advise.  Thanks!  [facility] Allergies(verified): 1.  ! CARAFATE (SUCRALFATE) 2.  ! REGLAN 3.  ! AFRIN SINUS (OXYMETAZOLINE HCL)  Follow-up by: Gweneth Dimitri RN,  July 21, 2010 11:16 AM  Additional Follow-up for Phone Call Additional follow up Details #1::        avelox 400 once daily x 7ds tylenol sinus OTC as needed headache Additional Follow-up by: Comer Locket. Vassie Loll MD,  July 21, 2010 4:21 PM    Additional Follow-up for Phone Call Additional follow up Details #2::    rx sent . pt aware of recs.Carron Curie CMA  July 21, 2010 4:36 PM   New/Updated Medications: AVELOX 400 MG TABS (MOXIFLOXACIN HCL) Take 1 tablet by mouth once a day Prescriptions: AVELOX 400 MG TABS (MOXIFLOXACIN HCL) Take 1 tablet by mouth once a day  #7 x 0   Entered by:    Carron Curie CMA   Authorized by:   Comer Locket. Vassie Loll MD   Signed by:   Carron Curie CMA on 07/21/2010   Method used:   Electronically to        [facility] [phone]* (retail)       [address]       [place], [place]  [address]       Ph: [phone] or [phone]       Fax: [phone]   RxID:   [id number]

## 2010-09-02 NOTE — Progress Notes (Signed)
Summary: zolpidem refill  Phone Note Refill Request Message from:  Fax from Pharmacy on August 27, 2009 10:50 AM  Refills Requested: Medication #1:  ZOLPIDEM TARTRATE 10 MG  TABS 1/2 or 1 by mouth at hs prn   Dosage confirmed as above?Dosage Confirmed   Last Refilled: 06/29/2009 Initial call taken by: Lucious Groves,  August 27, 2009 10:50 AM  Follow-up for Phone Call        OK x3 RTC 3 months  Follow-up by: Tresa Garter MD,  August 27, 2009 12:59 PM    Prescriptions: ZOLPIDEM TARTRATE 10 MG  TABS (ZOLPIDEM TARTRATE) 1/2 or 1 by mouth at hs prn  #90 x 0   Entered by:   Lamar Sprinkles, CMA   Authorized by:   Tresa Garter MD   Signed by:   Lamar Sprinkles, CMA on 08/27/2009   Method used:   [facility]phoned to ...       [facility]  [address] [phone]* (retail)       [address]       [place], [place]  [address]       Ph: [phone] or [phone]       Fax: [phone]   RxID:   [id number]

## 2010-09-02 NOTE — Assessment & Plan Note (Signed)
Summary: Pulmonary OV   Copy to:  Dr Shan Levans Primary Provider/Referring Provider:  Dr. Sonda Primes  CC:  5 month follow up.  States breathing is doing well overall.  Denies wheezing, chest tightnss, cough, and SOB.  No complaints.  .  History of Present Illness: This is a 70 year old, white female with stage II sarcoidosis.     tightness.    January 26, 2010 5:13 PM The pt has been doing well since 1/11.  No increase cough.  Had bronchitis in 3/11 and saw PCP. No abx since.   Pt denies any significant sore throat, nasal congestion or excess secretions, fever, chills, sweats, unintended weight loss, pleurtic or exertional chest pain, orthopnea PND, or leg swelling Pt denies any increase in rescue therapy over baseline, denies waking up needing it or having any early am or nocturnal exacerbations of coughing/wheezing/or dyspnea.    Preventive Screening-Counseling & Management  Alcohol-Tobacco     Alcohol drinks/day: 0     Smoking Status: quit > 6 months     Year Quit: 1992  Current Medications (verified): 1)  Levoxyl 112 Mcg Tabs (Levothyroxine Sodium) .... Qd 2)  Zegerid 40-1100 Mg Caps (Omeprazole-Sodium Bicarbonate) .... One By Mouth Once Daily 3)  Zolpidem Tartrate 10 Mg  Tabs (Zolpidem Tartrate) .... 1/2 or 1 By Mouth At [facility] Prn 4)  Creon 24000 Unit Cpep (Pancrelipase (Lip-Prot-Amyl)) .... Take 1 Capsule By Mouth Three Times A Day Before Meals 5)  Glucosamine Chondr 1500 Complx   Caps (Glucosamine-Chondroit-Vit C-Mn) .... Two Times A Day 6)  Icaps Areds Formula  Tabs (Multiple Vitamins-Minerals) .Marland Kitchen.. 1 Once Daily 7)  Adult Aspirin Low Strength 81 Mg  Tbdp (Aspirin) .... Once Daily 8)  Optive 0.5-0.9 %  Soln (Carboxymethylcellul-Glycerin) .Marland Kitchen.. 1 Gtt Each Eye As Needed 9)  Vitamin B-12 Cr 1000 Mcg  Tbcr (Cyanocobalamin) .... Take One Tablet By Mouth , Once Weekly 10)  Iron 325 (65 Fe) Mg  Tabs (Ferrous Sulfate) .Marland Kitchen.. 1 By Mouth Once Weekly 11)  Vitamin D 1000 Unit  Caps  (Cholecalciferol) .Marland Kitchen.. 1 By Mouth Daily 12)  Oscal 500/200 D-3 500-200 Mg-Unit  Tabs (Calcium-Vitamin D) .... 3 By Mouth Daily 13)  Gaviscon Extra Strength 160-105 Mg  Chew (Alum Hydroxide-Mag Carbonate) .... 3 By Mouth As Needed 14)  Flaxseed Oil 1000 Mg  Caps (Flaxseed (Linseed)) .Marland Kitchen.. 1 By Mouth Two Times A Day 15)  Cpap .Marland Kitchen.. 12 Cmh20 Qhs 16)  Qvar 40 Mcg/act Aers (Beclomethasone Dipropionate) .... 2 Puffs Two Times A Day 17)  Fexofenadine Hcl 180 Mg Tabs (Fexofenadine Hcl) .Marland Kitchen.. 1 By Mouth Daily 18)  Vitamin C 500 Mg Tabs (Ascorbic Acid) .Marland Kitchen.. 1 By Mouth Daily 19)  Lotemax 0.5 % Susp (Loteprednol Etabonate) .Marland Kitchen.. 1 Drop in Both Eyes Daily As Needed 20)  Ming Mu Abner Greenspan Wan 30gr .... 5 By Mouth Two Times A Day  Allergies (verified): 1)  ! Carafate (Sucralfate) 2)  ! Reglan 3)  ! Afrin Sinus (Oxymetazoline Hcl)  Past History:  Past medical, surgical, family and social histories (including risk factors) reviewed, and no changes noted (except as noted below).  Past Medical History: Reviewed history from 02/12/2008 and no changes required. 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 Appendectomy  1988 Lumbar disc surgery 1995 ([place]) Multiple knee arthroscopies ([place]) Cholecystectomy 2000 [facility]) Right Carpal Tunnel release ([place]) 2002 Left hand hemangiom 1997 ([place])  Family History: Reviewed history from 08/09/2007 and no changes required. Breast Cancer brain ca in father  Social History: Reviewed history from 04/28/2008 and no changes required. Psychologist, forensic Married Never Smoked No ETOH No  caffeine drinks  Review of Systems       The patient complains of nasal congestion/difficulty breathing through nose.  The patient denies shortness of breath with activity, shortness of breath at rest, productive cough, non-productive cough, coughing up blood, chest pain, irregular heartbeats, acid heartburn, indigestion, loss of appetite, weight change, abdominal pain, difficulty swallowing, sore throat, tooth/dental problems, headaches, sneezing, itching, ear ache, anxiety, depression, hand/feet swelling, joint stiffness or pain, rash, change in color of mucus, and fever.    Vital Signs:  Patient profile:   70 year old female Height:      65 inches Weight:      160 pounds BMI:     26.72 O2 Sat:      97 % on Room air Temp:     98.1 degrees F oral Pulse rate:   70 / minute BP sitting:   110 / 60  (right arm) Cuff size:   regular  Vitals Entered By: Gweneth Dimitri RN (January 26, 2010 5:01 PM)  O2 Flow:  Room air CC: 5 month follow up.  States breathing is doing well overall.  Denies wheezing, chest tightnss, cough, SOB.  No complaints.   Comments Medications reviewed with patient Daytime contact number verified with patient. Gweneth Dimitri RN  January 26, 2010 5:02 PM    Physical Exam  Additional Exam:  Gen: Pleasant, well nourished, in no distress ENT: no lesions, no postnasal drip Neck: No JVD, no TMG, no carotid bruits Lungs: no use of accessory muscles, no dullness to percussion, clear without rales or rhonchi Cardiovascular: RRR, heart sounds normal, no murmurs or gallops, no peripheral edema Musculoskeletal: No deformities, no cyanosis or clubbing     Impression & Recommendations:  Problem # 1:  SARCOIDOSIS, PULMONARY (ICD-135) Assessment Improved  Pulmonary Sarcoidosis with airway involvement now improved with topical ICS therapy  plan cont inhaled meds as prescribed f/u with full pfts  Complete Medication List: 1)  Levoxyl 112 Mcg Tabs (Levothyroxine sodium) ....  Qd 2)  Zegerid 40-1100 Mg Caps (Omeprazole-sodium bicarbonate) .... One by mouth once daily 3)  Zolpidem Tartrate 10 Mg Tabs (Zolpidem tartrate) .... 1/2 or 1 by mouth at hs prn 4)  Creon 24000 Unit Cpep (Pancrelipase (lip-prot-amyl)) .... Take 1 capsule by mouth three times a day before meals 5)  Glucosamine Chondr 1500 Complx Caps (Glucosamine-chondroit-vit c-mn) .... Two times a day 6)  Icaps Areds Formula Tabs (Multiple vitamins-minerals) .Marland Kitchen.. 1 once daily 7)  Adult Aspirin Low Strength 81 Mg Tbdp (Aspirin) .... Once daily 8)  Optive 0.5-0.9 % Soln (Carboxymethylcellul-glycerin) .Marland Kitchen.. 1 gtt each eye as needed 9)  Vitamin B-12 Cr 1000 Mcg Tbcr (Cyanocobalamin) .... Take one tablet by mouth , once weekly 10)  Iron 325 (65 Fe) Mg Tabs (Ferrous sulfate) .Marland Kitchen.. 1 by mouth once weekly 11)  Vitamin D 1000 Unit Caps (Cholecalciferol) .Marland Kitchen.. 1 by mouth daily 12)  Oscal 500/200 D-3 500-200 Mg-unit Tabs (Calcium-vitamin d) .... 3 by mouth daily 13)  Gaviscon Extra Strength 160-105 Mg Chew (Alum hydroxide-mag carbonate) .... 3 by mouth as needed 14)  Flaxseed Oil 1000 Mg Caps (Flaxseed (linseed)) .Marland Kitchen.. 1 by  mouth two times a day 15)  Cpap  .Marland Kitchen.. 12 cmh20 qhs 16)  Qvar 40 Mcg/act Aers (Beclomethasone dipropionate) .... 2 puffs two times a day 17)  Fexofenadine Hcl 180 Mg Tabs (Fexofenadine hcl) .Marland Kitchen.. 1 by mouth daily 18)  Vitamin C 500 Mg Tabs (Ascorbic acid) .Marland Kitchen.. 1 by mouth daily 19)  Lotemax 0.5 % Susp (Loteprednol etabonate) .Marland Kitchen.. 1 drop in both eyes daily as needed 20)  Ming Mu Abner Greenspan Wan 30gr  .... 5 by mouth two times a day  Other Orders: Est. Patient Level III ([address]) Pulmonary Referral (Pulmonary)  Patient Instructions: 1)  A pulmonary function test will be scheduled on a Monday, I will call with results 2)  No change in qvar dose,  refills sent to pharmacy 3)  Return 6 months Prescriptions: QVAR 40 MCG/ACT AERS (BECLOMETHASONE DIPROPIONATE) 2 puffs two times a day  #1 x 6   Entered and  Authorized by:   Storm Frisk MD   Signed by:   Storm Frisk MD on 01/26/2010   Method used:   [facility] to        [facility] [phone]* (retail)       [address]       [place], [place]  [address]       Ph: [phone] or [phone]       Fax: [phone]   RxID:   [id number]

## 2010-09-03 ENCOUNTER — Ambulatory Visit: Admit: 2010-09-03 | Payer: Self-pay | Admitting: Internal Medicine

## 2010-09-03 ENCOUNTER — Other Ambulatory Visit: Payer: Self-pay

## 2010-09-10 ENCOUNTER — Telehealth (INDEPENDENT_AMBULATORY_CARE_PROVIDER_SITE_OTHER): Payer: Self-pay | Admitting: *Deleted

## 2010-09-10 ENCOUNTER — Encounter: Payer: Self-pay | Admitting: Internal Medicine

## 2010-09-10 ENCOUNTER — Ambulatory Visit (INDEPENDENT_AMBULATORY_CARE_PROVIDER_SITE_OTHER): Payer: Medicare Other | Admitting: Internal Medicine

## 2010-09-10 DIAGNOSIS — J309 Allergic rhinitis, unspecified: Secondary | ICD-10-CM

## 2010-09-10 DIAGNOSIS — K219 Gastro-esophageal reflux disease without esophagitis: Secondary | ICD-10-CM

## 2010-09-10 DIAGNOSIS — R071 Chest pain on breathing: Secondary | ICD-10-CM | POA: Insufficient documentation

## 2010-09-10 DIAGNOSIS — E039 Hypothyroidism, unspecified: Secondary | ICD-10-CM

## 2010-09-10 DIAGNOSIS — M170 Bilateral primary osteoarthritis of knee: Secondary | ICD-10-CM | POA: Insufficient documentation

## 2010-09-10 DIAGNOSIS — M1990 Unspecified osteoarthritis, unspecified site: Secondary | ICD-10-CM | POA: Insufficient documentation

## 2010-09-16 NOTE — Assessment & Plan Note (Signed)
Summary: 6 MO F/U/CD   Vital Signs:  Patient profile:   70 year old female Height:      65 inches Weight:      157 pounds BMI:     26.22 Temp:     97.9 degrees F oral Pulse rate:   72 / minute Pulse rhythm:   regular Resp:     16 per minute BP sitting:   130 / 70  (left arm) Cuff size:   regular  Vitals Entered By: Lanier Prude, CMA(AAMA) (September 10, 2010 7:54 AM) CC: 6 mo f/u  Is Patient Diabetic? No Comments pt is not taking Lotemax or Avelox   Primary Care Provider:  Dr. Trinna Post Plotnikov  CC:  6 mo f/u .  History of Present Illness: The patient presents for a follow up of GERD, hypothyroidism and OA She fell the other day on a concrete on L side - L side hurts x 2 wks, worse with sneezing.  Current Medications (verified): 1)  Levoxyl 112 Mcg Tabs (Levothyroxine Sodium) .... One Daily 2)  Zegerid 40-1100 Mg Caps (Omeprazole-Sodium Bicarbonate) .... One By Mouth Once Daily 3)  Zolpidem Tartrate 10 Mg  Tabs (Zolpidem Tartrate) .... 1/2 or 1 By Mouth At [facility] Prn 4)  Creon 24000 Unit Cpep (Pancrelipase (Lip-Prot-Amyl)) .... Take 1 Capsule By Mouth Three Times A Day Before Meals 5)  Glucosamine Chondr 1500 Complx   Caps (Glucosamine-Chondroit-Vit C-Mn) .... Two Times A Day 6)  Icaps Areds Formula  Tabs (Multiple Vitamins-Minerals) .Marland Kitchen.. 1 Once Daily 7)  Adult Aspirin Low Strength 81 Mg  Tbdp (Aspirin) .... Once Daily 8)  Optive 0.5-0.9 %  Soln (Carboxymethylcellul-Glycerin) .Marland Kitchen.. 1 Gtt Each Eye As Needed 9)  Vitamin B-12 Cr 1000 Mcg  Tbcr (Cyanocobalamin) .... Take One Tablet By Mouth Once Daily 10)  Vitamin D 1000 Unit  Caps (Cholecalciferol) .Marland Kitchen.. 1 By Mouth Daily 11)  Oscal 500/200 D-3 500-200 Mg-Unit  Tabs (Calcium-Vitamin D) .... 3 By Mouth Daily 12)  Gaviscon Extra Strength 160-105 Mg  Chew (Alum Hydroxide-Mag Carbonate) .... 3 By Mouth As Needed 13)  Flaxseed Oil 1000 Mg  Caps (Flaxseed (Linseed)) .Marland Kitchen.. 1 By Mouth Two Times A Day 14)  Cpap .Marland Kitchen.. 12 Cmh20 Qhs 15)  Qvar 40  Mcg/act Aers (Beclomethasone Dipropionate) .... 2 Puffs Two Times A Day 16)  Vitamin C 500 Mg Tabs (Ascorbic Acid) .Marland Kitchen.. 1 By Mouth Daily 17)  Lotemax 0.5 % Susp (Loteprednol Etabonate) .Marland Kitchen.. 1 Drop in Both Eyes Daily As Needed 18)  Ming Mu Abner Greenspan Wan 30gr .... 5 By Mouth Two Times A Day 19)  Meclizine Hcl 12.5 Mg Tabs (Meclizine Hcl) .Marland Kitchen.. 1-2 By Mouth Qid As Needed Dizziness 20)  Avelox 400 Mg Tabs (Moxifloxacin Hcl) .... Take 1 Tablet By Mouth Once A Day 21)  Fluocinolone Acetonide 0.025 % Crea (Fluocinolone Acetonide) .... Use Bid 22)  Systane 0.4-0.3 % Soln (Polyethyl Glycol-Propyl Glycol) .Marland Kitchen.. 1-2 Drops in Each Eye 4 Times Daily  Allergies (verified): 1)  ! Carafate (Sucralfate) 2)  ! Reglan 3)  ! 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  < 1ppd x 17 yrs.  Quit in 1992. No ETOH No caffeine drinks Blood donor q 6 months   Past Medical History: Current Problems:  INSOMNIA-SLEEP DISORDER-UNSPEC (ICD-307.40) FATIGUE (ICD-780.79) SARCOIDOSIS, PULMONARY (ICD-135) stage II HYPOTHYROIDISM (ICD-244.9) GERD (ICD-530.81) COLONIC POLYPS, HX OF (ICD-V12.72) ANEMIA-IRON DEFICIENCY (ICD-280.9) ALLERGIC  RHINITIS (ICD-477.9) Pancreatitis, hx of Polio (1948) VIt D def Vit B12 def Osteoarthritis Dr Ranell Patrick  Review of Systems       The patient complains of chest pain.  The patient denies fever, syncope, and dyspnea on exertion.    Physical Exam  General:  Well developed, well nourished, no acute distress. Head:  Normocephalic and atraumatic without obvious abnormalities. No apparent alopecia or balding. Mouth:  Oral mucosa and oropharynx without lesions or exudates.  Teeth in good repair. Chest Wall:  L lat chest is not tender Lungs:  Clear throughout to auscultation. Heart:  Regular rate and rhythm; no murmurs, rubs,  or bruits. Abdomen:  soft and nontender without mass  or HSM BS+ Msk:  normal ROM, no joint tenderness, and no joint swelling.   Neurologic:  No cranial nerve deficits noted. Station and gait are normal. Plantar reflexes are down-going bilaterally. DTRs are symmetrical throughout. Sensory, motor and coordinative functions appear intact. H-P (+) on R Skin:  turgor normal, color normal, no rashes, no suspicious lesions, no ecchymoses, no petechiae, no purpura, no ulcerations, and no edema.   Psych:  Cognition and judgment appear intact. Alert and cooperative with normal attention span and concentration. No apparent delusions, illusions, hallucinations   Impression & Recommendations:  Problem # 1:  GERD (ICD-530.81) Assessment Unchanged  Her updated medication list for this problem includes:    Zegerid 40-1100 Mg Caps (Omeprazole-sodium bicarbonate) ..... One by mouth once daily IS THE ONLY ONE THAT HELPS    Gaviscon Extra Strength 160-105 Mg Chew (Alum hydroxide-mag carbonate) .Marland KitchenMarland KitchenMarland KitchenMarland Kitchen 3 by mouth as needed  Problem # 2:  B12 DEFICIENCY (ICD-266.2) Assessment: Unchanged On the regimen of medicine(s) reflected in the chart    Problem # 3:  HYPOTHYROIDISM (ICD-244.9) Assessment: Unchanged  Her updated medication list for this problem includes:    Levoxyl 112 Mcg Tabs (Levothyroxine sodium) ..... One daily  Problem # 4:  ALLERGIC RHINITIS (ICD-477.9) Assessment: Unchanged On the regimen of medicine(s) reflected in the chart    Problem # 5:  CHEST WALL PAIN, ACUTE (ICD-786.52) L - contusion Assessment: New She declined x ray Call if you are not better in a reasonable amount of time or if worse. Heat, stretching.  Complete Medication List: 1)  Levoxyl 112 Mcg Tabs (Levothyroxine sodium) .... One daily 2)  Zegerid 40-1100 Mg Caps (Omeprazole-sodium bicarbonate) .... One by mouth once daily 3)  Zolpidem Tartrate 10 Mg Tabs (Zolpidem tartrate) .... 1/2 or 1 by mouth at hs prn 4)  Creon 24000 Unit Cpep (Pancrelipase (lip-prot-amyl)) .... Take  1 capsule by mouth three times a day before meals 5)  Glucosamine Chondr 1500 Complx Caps (Glucosamine-chondroit-vit c-mn) .... Two times a day 6)  Icaps Areds Formula Tabs (Multiple vitamins-minerals) .Marland Kitchen.. 1 once daily 7)  Adult Aspirin Low Strength 81 Mg Tbdp (Aspirin) .... Once daily 8)  Optive 0.5-0.9 % Soln (Carboxymethylcellul-glycerin) .Marland Kitchen.. 1 gtt each eye as needed 9)  Vitamin B-12 Cr 1000 Mcg Tbcr (Cyanocobalamin) .... Take one tablet by mouth once daily 10)  Vitamin D 1000 Unit Caps (Cholecalciferol) .Marland Kitchen.. 1 by mouth daily 11)  Oscal 500/200 D-3 500-200 Mg-unit Tabs (Calcium-vitamin d) .... 3 by mouth daily 12)  Gaviscon Extra Strength 160-105 Mg Chew (Alum hydroxide-mag carbonate) .... 3 by mouth as needed 13)  Flaxseed Oil 1000 Mg Caps (Flaxseed (linseed)) .Marland Kitchen.. 1 by mouth two times a day 14)  Cpap  .Marland Kitchen.. 12 cmh20 qhs 15)  Qvar 40 Mcg/act Aers (Beclomethasone dipropionate) .Marland KitchenMarland KitchenMarland Kitchen  2 puffs two times a day 16)  Vitamin C 500 Mg Tabs (Ascorbic acid) .Marland Kitchen.. 1 by mouth daily 17)  Lotemax 0.5 % Susp (Loteprednol etabonate) .Marland Kitchen.. 1 drop in both eyes daily as needed 18)  Ming Mu Abner Greenspan Wan 30gr  .... 5 by mouth two times a day 19)  Meclizine Hcl 12.5 Mg Tabs (Meclizine hcl) .Marland Kitchen.. 1-2 by mouth qid as needed dizziness 20)  Fluocinolone Acetonide 0.025 % Crea (Fluocinolone acetonide) .... Use bid 21)  Systane 0.4-0.3 % Soln (Polyethyl glycol-propyl glycol) .Marland Kitchen.. 1-2 drops in each eye 4 times daily  Patient Instructions: 1)  Please schedule a follow-up appointment in 6 months well w/labs.   Orders Added: 1)  Est. Patient Level IV [13086]

## 2010-10-01 ENCOUNTER — Encounter: Payer: Self-pay | Admitting: Internal Medicine

## 2010-10-12 NOTE — Medication Information (Signed)
Summary: Approved/[facility]  Approved/[facility]   Imported By: Lester [place] 10/07/2010 07:48:45  _____________________________________________________________________  External Attachment:    Type:   Image     Comment:   External Document

## 2010-10-12 NOTE — Progress Notes (Signed)
Summary: PA-Zegerid  Phone Note Other Incoming   Summary of Call: We need to call [phone] and approve Zegerid please - nothing else worked (Prevacid, Nexium,  Prilosec, Protonix, Reglan) Thank you!  Initial call taken by: Tresa Garter MD,  September 10, 2010 8:16 AM  Follow-up for Phone Call        Called [facility] @ [phone], awaiting form. Dagoberto Reef  September 29, 2010 4:35 PM Form to Dr Posey Rea to complete. Dagoberto Reef  September 29, 2010 4:40 PM Faxed to [phone], awaiting approval. Dagoberto Reef  October 01, 2010 4:25 PM Approved through 08/01/2011, pt aware. Follow-up by: Dagoberto Reef,  October 05, 2010 2:05 PM

## 2010-12-14 NOTE — Assessment & Plan Note (Signed)
[facility]                         GASTROENTEROLOGY OFFICE NOTE   NAME:Battle, [name]                    MRN:          [id number]  DATE:09/06/2007                            DOB:          06/21/1941    CHIEF COMPLAINT:  Abdominal pain, nausea, abnormal LFTs.   [name] is a patient previously followed by Dr. Corinda Gubler.  Over the  past couple of weeks, she has had some nausea and a couple of episodes  of knife-like epigastric pain.  These were new symptoms to her.  Overall, she is better.  She says she is a little sore right now.  These  symptoms are not like prior episode of pancreatitis that she had.  At  that time, in 2004, a CT scan was done and was unrevealing.  She was  post-cholecystectomy at that time.  She does have sarcoidosis and,  though she is on some inhaled steroids, is not on any systemic steroids.  She had an EGD in May 2008, which showed some erythema in the antrum, in  the body of the stomach, in the lower esophageal ring, and was otherwise  unremarkable.  Dr. Corinda Gubler performed that.  She has had some frequent  soft bowel movements, but no severe diarrhea.  She has had a little bit  of heartburn.  Overall, though she has symptoms, I get a sense that she  is improving.  She did have some abnormal transaminases, but her lipase  and amylase were normal.  I have reviewed these in the EMR, as they were  entered by Dr. Posey Rea.  She had a urinalysis that was negative, as  well.  I should say she had some bilirubin in her urinalysis, as well as  some ketones.  Her vitamin B12 was normal.  Her ALT was 73 and her AST  was 54.  He stopped her Mobic at the time.   REVIEW OF SYSTEMS:  Notable for 16 mL of fluid drawn off her left knee  recently.  She is to have an MRI of that because that has been a  recurrent problem, and she feels like she is having some sinus drainage.  There are no sick contacts, though she does wonder if she might  not have  contracted some type of infection.  No fever.   PAST MEDICAL HISTORY:  1. Sarcoidosis.  2. Insomnia.  3. Chronic fatigue.  4. Hypothyroidism.  5. Gastroesophageal reflux disease.  6. History of colonic polyps.  Her last colonoscopy, on Dec 19, 2006,      demonstrated diminutive polyps that were cauterized with      destruction by Dr. Corinda Gubler and she had some internal hemorrhoids.      He recommended a repeat colonoscopy in five years.  She has a      history of an iron deficiency anemia.  She has allergic rhinitis.      She has pulmonary sarcoidosis; it is also in her right eye, and she      has a history of pancreatitis.  7. Vitamin B12 deficiency.  8. She has had an appendectomy.  9. Left-knee  arthroscopy.  10.Lower back surgery (Dr. Jeral Fruit).  11.Hemangioma removed from the left hand.  12.History of hypothyroidism.  13.Carpal tunnel surgery release, right hand, Dr. Teressa Senter.  14.Herniated discs in the low back, treated with epidural injections.  15.Cataracts.  16.Surgery to remove excessive scar tissue in the right leg, 2005.  17.Dr. Chaney Malling has performed right-knee arthroscopy with Hyalgan      injections, 2006.   MEDICATIONS:  Are listed and reviewed in the chart.   PHYSICAL EXAM:  Reveals an elderly white woman, in no acute distress.  Weight 156 pounds, blood pressure 128/70, pulse 64.  EYES:  Anicteric.  NECK:  Supple.  CHEST:  Clear and resonant.  HEART:  S1, S2.  I hear no rubs, murmurs or gallops.  There is no  jugular venous distention.  ABDOMEN:  Soft and nontender, without organomegaly or mass.  There is no cervical adenopathy.  She is alert and oriented times three.  HEENT:  Note that the lips, teeth and gums, oropharynx and auditory  acuity are normal.   ASSESSMENT:  Nausea, epigastric pain, abnormal LFTs.  This lady has  sarcoidosis.  Looking back through the chart, I get a sense that she has  had some functional. GI disturbance, as well, in the  past.  She does  have gastroesophageal reflux disease on top of that.  I am suspicious  that she may have had a viral syndrome that is slowly and gradually  improving.  I have repeated her LFTs and, at the time of dictation, they  are normal.  She was reassured today and we will give this some time  under observation.  If things persist, given her underlying  comorbidities, including sarcoidosis, she may need further investigation  with imaging and perhaps endoscopy, though she just had an endoscopy  about seven months ago, so I think that would be unlikely to be needed.  This will depend upon the clinical course.     Iva Boop, MD,FACG  Electronically Signed    CEG/MedQ  DD: 09/06/2007  DT: 09/07/2007  Job #: [id number]   cc:   Georgina Quint. Plotnikov, MD

## 2010-12-14 NOTE — Assessment & Plan Note (Signed)
Elizabeth Gray                             PULMONARY OFFICE NOTE   NAME:Elizabeth Gray, Elizabeth Gray                    MRN:          [id number]  DATE:03/06/2007                            DOB:          11-11-1940    Elizabeth Gray returns in followup status post bronchoscopy for bilateral  abnormalities on CT scan.  Biopsies showed sarcoidosis with noncaseating  granulomas.  The airway did show cobblestoning, which was classic for  sarcoid.  The ACE level also that was obtained was elevated at 90.  The  patient continues to have cough and dyspnea.  Her medications are  unchanged.   EXAM:  Temperature 98, blood pressure 130/72, pulse 65, saturation 98%  on room air.  CHEST:  Showed dry rales at the bases.  No wheeze or rhonchi noted.  CARDIAC:  Regular rate and rhythm without S3.  Normal S1, S2.  ABDOMEN:  Soft and nontender.   IMPRESSION:  Sarcoidosis with airway involvement.   PLAN:  The patient is to begin pulse prednisone at 40 mg a day with a  slow taper.  When she does eventually taper prednisone to off, the plan  will be to maintain inhaled corticosteroids chronically.  We will see  the patient back in 1 month prior to the discontinuance of planned  prednisone therapy.     Charlcie Cradle Delford Field, MD, [facility]  Electronically Signed    PEW/MedQ  DD: 03/07/2007  DT: 03/07/2007  Job #: [id number]   cc:   Georgina Quint. Plotnikov, MD

## 2010-12-14 NOTE — Op Note (Signed)
NAMELINZI, OHLINGER             ACCOUNT NO.:  [id number]   MEDICAL RECORD NO.:  [id number]          PATIENT TYPE:  AMB   LOCATION:  CARD                         FACILITY:  [facility]   PHYSICIAN:  Charlcie Cradle. Delford Field, MD, FCCPDATE OF BIRTH:  11-10-1940   DATE OF PROCEDURE:  02/23/2007  DATE OF DISCHARGE:                               OPERATIVE REPORT   PROCEDURE PERFORMED:  Bronchoscopy.   CHIEF COMPLAINT:  Abnormal CT scan.   OPERATOR:  Charlcie Cradle. Delford Field, M.D. [facility]   ANESTHESIA:  1% Xylocaine local.   PREOPERATIVE MEDICATION:  Fentanyl 50 mcg, Versed 3 mg IV push.   PROCEDURE:  The Pentax video bronchoscope was introduced through the  left naris.  The upper airways were visualized and were unremarkable.  The entire tracheobronchial tree was visualized and showed only mild  tracheobronchitis.  No evidence of endobronchial lesions were seen.  Attention was then paid to the right upper lobe orifice.  Transbronchial  biopsies were obtained.  After the second biopsy, a 75 mL hemorrhage  occurred.  This had to be controlled with topical epinephrine and  thrombin.  No further biopsies were attempted.  Bronchial washings were  obtained and the procedure was terminated early.   COMPLICATIONS:  75 mL hemorrhage controlled with topical epinephrine and  thrombin.   IMPRESSION:  Bilateral pulmonary apical interstitial nodular  infiltrates, evaluate for cause, but likely inadequate tissue sampling  due to hemorrhage.   RECOMMENDATIONS:  Follow up material that we have and will make further  plans based on the results of this.      Charlcie Cradle Delford Field, MD, [facility]  Electronically Signed     PEW/MEDQ  D:  02/23/2007  T:  02/23/2007  Job:  [id number]   cc:   Georgina Quint. Plotnikov, MD  [address]  [place]  [place] [address]

## 2010-12-14 NOTE — Assessment & Plan Note (Signed)
[facility]                             PULMONARY OFFICE NOTE   NAME:Elizabeth Gray, Elizabeth Gray                    MRN:          [id number]  DATE:02/20/2007                            DOB:          18-Aug-1940    PULMONARY CONSULTATION   CHIEF COMPLAINT:  Evaluate abnormal chest CT scan.   HISTORY OF PRESENT ILLNESS:  A 70 year old female who has had chronic  fatigue, chronic right-sided pain, which is an ache.  She saw  gastroenterology, referred the patient to internal medicine and obtained  a CT scan of the chest showing nodularity in the right upper lobe and  left upper lobe.  On this basis, she was referred for further  evaluation.  She has had no cough.  She does have chronic post-nasal  drainage.  The ache does not change with a deep breath.  It radiates  into the back.  She is not short of breath either at rest or with  exertion.  She has no dysphagia.  Does have occasional heartburn.  She  has no skin changes.  She smoked less than a pack a day for 17 years.  Quit smoking in 1992.  She has no hemoptysis.  There is no irregular  heartbeat.  There is no loss of appetite, change in weight, abdominal  pain, difficulty swallowing, sore throat.  She denies headache,  sneezing, itching, earache, anxiety, or depression.  She is referred for  further evaluation.   PAST MEDICAL HISTORY:  No history of hypertension, heart disease,  asthma, emphysema, diabetes, stroke, cancer, kidney disease, sleep  apnea, or other disorders of the central nervous system.  She had a  cholecystectomy in 2000, lower back surgery in 1995.   MEDICATION ALLERGIES:  CARAFATE, REGLAN.   CURRENT MEDICATIONS:  1. Zegerid 40 mg daily.  2. Levoxyl 125 mcg daily.  3. Creon 20 daily.  4. Lodine 300 mg b.i.d.  5. Ambien 1/2 at bedtime.  6. A variety of vitamin supplementations.  7. Claritin 10 mg daily.   EXAM:  Temperature was 97.6, blood pressure 120/70, pulse 77, saturation  99%  on room air.  CHEST:  Showed to be clear without evidence of wheeze, rales, or  adventitious breath sounds.  CARDIAC:  Regular rate and rhythm without S3.  Normal S1, S2.  ABDOMEN:  Soft and nontender.  EXTREMITIES:  No edema or clubbing.  SKIN:  Clear.  NEUROLOGIC:  Intact.  HEENT:  No jugular venous distension.  No lymphadenopathy.  Oropharynx  clear.   A CT scan of the chest was obtained and reviewed, revealed upper lobe  predominant perilymphatic nodularity, mildly prominent mediastinal and  bi-hilar lymphoid tissue as well.  There is also a nodular splenic  component.  No other acute findings were seen.  The peribronchial  vascular nodularity is somewhat subtle and has an upper lobe  predominance right greater than left.   IMPRESSION:  Interstitial nodular infiltrate versus lymphatic  abnormality, rule out sarcoidosis with associated splenic changes.   RECOMMENDATIONS:  Obtain an ACE level and pursue bronchoscopy.  Once the  results of this is available,  further recommendations will follow.     Charlcie Cradle Delford Field, MD, [facility]  Electronically Signed    PEW/MedQ  DD: 02/20/2007  DT: 02/21/2007  Job #: [id number]   cc:   Georgina Quint. Plotnikov, MD

## 2010-12-14 NOTE — Assessment & Plan Note (Signed)
[facility]                             PULMONARY OFFICE NOTE   NAME:Elizabeth Gray, Elizabeth Gray                    MRN:          [id number]  DATE:05/09/2007                            DOB:          1941/03/11    Ms. Swalley is a 70 year old white female, history of sarcoidosis.  She  is improved with decreased cough, decreased shortness of breath  maintaining Asmanex 2 sprays daily.  She does have increased postnasal  drainage.  Her chronic medications are the same, and have not changed.   EXAMINATION:  Temperature 97.  Blood pressure 118/60.  Pulse 69.  Saturation 96% room air.  CHEST:  Showed to be clear without evidence of wheeze or rhonchi.  CARDIAC EXAM:  Showed a regular rate and rhythm without S3.  Normal S1  and S2.  ABDOMEN:  Soft and nontender.  EXTREMITIES:  Showed no edema or clubbing.  SKIN:  Was clear.  NEUROLOGIC:  Exam intact.  HEENT:  Exam showed no jugular venous distention.  No lymphadenopathy.  Oropharynx clear.  NECK:  Supple.   IMPRESSION:  Sarcoidosis.  Stable at this time.   PLAN:  Doreatha Martin current course of Asmanex.  Begin Nasacort 2 sprays each  nostril daily for allergic rhinitis.  Return to this office in 3 months.     Charlcie Cradle Delford Field, MD, [facility]  Electronically Signed    PEW/MedQ  DD: 05/09/2007  DT: 05/09/2007  Job #: [id number]   cc:   Georgina Quint. Plotnikov, MD

## 2010-12-14 NOTE — Assessment & Plan Note (Signed)
[facility]                             PULMONARY OFFICE NOTE   NAME:Gaccione, DEWEY NEUKAM                    MRN:          [id number]  DATE:03/23/2007                            DOB:          Apr 09, 1941    Ms. Fargnoli is a 70 year old white female, history of sarcoidosis with  airway involvement.  She has chronic cough, and has fatigue.  She has  just finished a course of systemic prednisone, and is now starting  Asmanex 2 sprays daily.  Her maintenance were in the chart, and correct  as reviewed.   EXAMINATION:  Temperature 97.  Blood pressure 116/62.  Pulse 70.  Saturation 98% room air.  CHEST:  Showed to be clear without evidence of wheeze or rhonchi.  CARDIAC EXAM:  Showed a regular rate and rhythm without S3.  Normal S1  and S2.  ABDOMEN:  Soft and nontender.  EXTREMITIES:  Showed no edema or clubbing.  SKIN:  Was clear.  Pulmonary functions were obtained, and showed normal spirometry.  Normal  lung volumes and diffusion capacity.   IMPRESSION:  In this patient is that of sarcoidosis with airway  involvement.  Stable at this time.   PLAN:  For this patient is to maintain Asmanex 2 sprays daily x2  canisters, then discontinue.  We will see the patient back in 6 weeks.     Charlcie Cradle Delford Field, MD, [facility]  Electronically Signed    PEW/MedQ  DD: 03/23/2007  DT: 03/24/2007  Job #: [id number]   cc:   Georgina Quint. Plotnikov, MD

## 2010-12-14 NOTE — Procedures (Signed)
NAME:  Elizabeth Gray, Elizabeth Gray             ACCOUNT NO.:  [id number]   MEDICAL RECORD NO.:  [id number]          PATIENT TYPE:  OUT   LOCATION:  SLEEP CENTER                 FACILITY:  [facility]   PHYSICIAN:  Oretha Milch, MD      DATE OF BIRTH:  1940/08/10   DATE OF STUDY:                            NOCTURNAL POLYSOMNOGRAM   REFERRING PHYSICIAN:   INDICATION FOR THE STUDY:  Ms. Haralson is a 70 year old woman with  excessive daytime somnolence, snoring, and witnessed apneas.  At the  time of the study, she weighed 152 pounds with a height of 65 inches,  BMI of 27, and neck size of 14 inches.  She has sleep-onset insomnia for  many years and took Ambien prior to arrival to the Sleep Lab at 6:45  p.m.(7.5 mg).   This overnight polysomnogram was performed in the form of a split-study  with the sleep technologist in attendance.  EEG, EOG, EMG, EKG, and  respiratory parameters were recorded.  Sleep studies with arousal limb  movements and respiratory data were scored according to the criteria  laid out by the American Academy of Sleep Medicine.   SLEEP ARCHITECTURE:  Lights out was at 9:35 p.m.  Lights on was at 4:51  a.m.  During the diagnostic portion, total sleep time was 125.5 minutes  with a sleep period time of 171 minutes and a sleep efficiency of 60%.  Sleep latency was 113.5 minutes and latency at REM sleep was 78.5  minutes.  Sleep stages as a percentages; total sleep time was N1 9.2%,  N2 41%, N3 17.1% and REM sleep 32.7% (41 minutes).  She spent 65 minutes  in the supine position and 41 minutes in the supine REM sleep.  Long  period of REM was noted around 1 a.m.   AROUSAL DATA:  During the baseline portion, there were 41 arousals with  an arousal index of 19.6 events per hour.  Of these, 29 were  spontaneous, 7 were associated with hypopneas, and 4 with periodic limb  movements.   RESPIRATORY DATA:  During the baseline portion, there were a total of 23  hypopneas, 0 obstructive  apnea, 0 central apnea, and 0 mixed apnea with  an apnea-hypopnea index of 11 events per hour.  Most of these events  were noted during REM sleep with a REM-related AHI of 29.3 events per  hour.  The longest hypopnea duration was 44 seconds.   With this degree of sleep disturbance, CPAP was initiated with a small  full-face mask at 8 cm.  CPAP was subsequently titrated to 12 cm due to  few hypopneas.  At the level of 9 cm were 17.5 minutes, 14 arousals were  noted and no events were noted.  One obstructive apnea and one hypopnea  were noted at 11 cm.  At the level of 12 cm were 46 minutes of non-REM  sleep, 2 arousals and no events were noted.   LIMB MOVEMENT DATA:  During the diagnostic portion, 67 PLMs were no  noted with an index of 32 events per hour.  PLM arousal index was only  1.9 events per hour.   OXYGEN SATURATION  DATA:  The lowest desaturation during the diagnostic  portion was 84%.  No significant desaturations were noted on CPAP.   CARDIAC DATA:  A low heart rate during non-REM sleep was 46 beats per  minute and during REM was 53 beats per minute.  The high heart rate  noted was an artifact.  Cardiac arrhythmias were not observed.   DISCUSSION:  Prolonged sleep latency in spite of taking 7.5 mg of  Ambien.  Predominantly, REM-related event.  Almost, no events during non-  REM sleep.  These were corrected by CPAP of 12 cm; however, no REM sleep  was obtained on CPAP.  Hence, this is not an optimal titration study.   IMPRESSION:  1. Mild obstructive sleep apneas with predominant hypopneas during      rapid eye movement sleep causing sleep fragmentation and oxygen      desaturation.  2. This was corrected by continuous positive airway pressure of 12 cm.  3. No evidence of cardiac arrhythmias or behavioral disturbance during      sleep.  4. Few periodic limb movements were noted, predominantly at sleep      onset.  Note that the periodic limb movement arousal index was  only      1.9 events per hour.   DISCUSSION:  1. Treatment options for mild degree of sleep apnea will include CPAP      therapy, oral appliances, and/or surgery.  2. Trial of CPAP of 12 cm with a small full-face mask would be      warranted.  Based on subjective improvement, a decision can be made      regarding long-term therapy.  3. She should be asked to avoid medications with sedative side effects      and to avoid driving when sleepy.      Oretha Milch, MD  Electronically Signed     RVA/MEDQ  D:  06/11/2008 13:07:05  T:  06/12/2008 01:38:01  Job:  [id number]

## 2010-12-14 NOTE — Assessment & Plan Note (Signed)
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  She is a patient also of Dr. Posey Rea.  She comes in for  refills on Creon, which she says seems to help a great deal, although  because she was recently without the Creon, she experienced some  epigastric discomfort.  She also has had some problems swallowing and  she is scheduled for a colonoscopic examination soon and there was a  question of whether she should have an upper endoscopic examination at  the same time because of her more frequent symptoms of dysphagia.  Her  last upper endoscopy was in 2000.  She has had colon polyps in the past.   PHYSICAL EXAMINATION:  She looks great, weighs 158.  Blood pressure 120/60, pulse 84 and regular.  NECK:  Unremarkable.  HEART:  Unremarkable.  EXTREMITIES:  Unremarkable.   IMPRESSION:  1. Gastroesophageal reflux disease with some intermittent dysphagia,      but dry foods, meats and so on getting caught in her esophagus she      feels.  2. Status post cholecystitis with history of choledocholithiasis and      associated pancreatitis responding nicely to Creon.   RECOMMENDATION:  To schedule her an EGD along with the colonoscopy and  to have her see Dr. Posey Rea because she is experiencing some pain in  her right chest laterally and posteriorly and this could be over her  liver, but I think Dr. Posey Rea should see her, get chest x-rays and  maybe an ultrasound or CT of the liver and associated organs including  the pancreas and there was a question also from her ophthalmologist  about dry eyes and dry mouth and symptoms associated with this and she  also has experienced some dyspareunia and she looked up Sjogren's  syndrome, which was suggested by her ophthalmologist as a possibility,  and says she mainly has the symptoms with the fatigue, a little bit of  the arthritis, although she has osteoarthritis, and I believe it is an  more on terms of an inflammatory process and she does not have the  Raynaud's that I have normally seen in Sjogren's.  She does have the  GERD, which is part of the mobility problem in Sjogren's, but all these  things are so nonspecific, I told her it is very possible that she does  not have this syndrome as well, but she is concerned about it, so I have  asked her to see Dr. Posey Rea in the next several weeks instead of the  month and a half when she had an appointment, because I think she is  concerned about it.  In the meantime,  we will go ahead and schedule her for procedures and continue her on  medication; I  gave her refills and samples of these medicines.     Ulyess Mort, MD  Electronically Signed    SML/MedQ  DD: 12/11/2006  DT: 12/12/2006  Job #: [id number]   cc:   Georgina Quint. Plotnikov, MD

## 2010-12-17 NOTE — Op Note (Signed)
NAMEJACQUES, Elizabeth Gray             ACCOUNT NO.:  [id number]   MEDICAL RECORD NO.:  [id number]          PATIENT TYPE:  AMB   LOCATION:  DSC                          FACILITY:  [facility]   PHYSICIAN:  Rodney A. Mortenson, M.D.DATE OF BIRTH:  11/02/1940   DATE OF PROCEDURE:  05/12/2005  DATE OF DISCHARGE:                                 OPERATIVE REPORT   PREOPERATIVE DIAGNOSIS:  Tear in the surface of the lateral meniscus, right  knee.   POSTOPERATIVE DIAGNOSIS:  1.  Tear in the surface of the lateral meniscus, right knee.  2.  Severe osteoarthritis, lateral patellar facet, right knee.   PROCEDURE:  Proximal lateral meniscectomy, right knee.   SURGEON:  Lenard Galloway. Chaney Malling, M.D.   ANESTHESIA:  MAC.   PATHOLOGY:  With the arthroscope, a very careful examination was undertaken.  The medial patellar facet was visualized first.  This is absolutely normal.  The lateral patellar facet was visualized, and there was a large area of  total loss of articular cartilage and raw bone exposed.  The femoral notch  area appeared fairly normal with some slight superficial __________.  The  arthroscope was then passed into the medial compartment, and the articular  cartilage of the medial femoral condyle and medial tibial plateau was  normal.  There was slight fraying of the leading edge of the medial meniscus  at the junction of the mid and posterior thirds, and this is palpated and  probed.  Those tears were seen.  The rest of the meniscus was absolutely  normal.  The anterior cruciate ligament was visualized, and this was normal.  The arthroscope was then placed in the lateral compartment.  The articular  cartilage over the lateral femoral condyle and lateral tibial plateau was  normal.  There was extensive tearing in the leading edge of the posterior  horn of the lateral meniscus on the under-surface.  This is markedly frayed  and torn.   PROCEDURE:  The patient was placed on the operative table  in the supine  position with the pneumatic tourniquet was about the right upper thigh, the  right leg is placed in a leg holder.  The entire right lower extremity was  prepped with DuraPrep and draped in the usual manner.  With Marcaine in  place, Xylocaine and epinephrine were used to infiltrate the puncture  wounds.  An effusion cannula was placed in the knee.  The knee was distended  with saline.  The anterior medial and anterolateral portals were made, and  the arthroscope was introduced.  The findings were as described above.  The  lateral compartment was visualized and grasped first.  Through both medial  and lateral portals, searcher baskets were inserted, and the posterior horn  of the lateral meniscus was debrided.  This was followed with intra-  articular shaver.  The remaining remnants were then smoothed and balanced  with a nice transition to the mid third of the medial meniscus.  Attention  was then turned to the posterior aspect of the lateral patellar facet.  The  chondroplasty shaver was used, and this was debrided.  Marcaine was then  placed, and then a large bulky pressure dressing was applied.  The patient  was then returned to the recovery room in excellent condition.  Technically,  this procedure went extremely well.   DRAINS:  None.   COMPLICATIONS:  None.   DISPOSITION:  1.  To my office on Wednesday.  2.  Percocet for pain.           ______________________________  Lenard Galloway. Chaney Malling, M.D.     RAM/MEDQ  D:  05/12/2005  T:  05/12/2005  Job:  [id number]

## 2011-01-05 ENCOUNTER — Other Ambulatory Visit: Payer: Self-pay | Admitting: Internal Medicine

## 2011-01-05 NOTE — Telephone Encounter (Signed)
Zegerid refilled # 30 with 6. Last seen 07/2009. Will need appt for next refill.

## 2011-01-06 ENCOUNTER — Other Ambulatory Visit: Payer: Self-pay | Admitting: Critical Care Medicine

## 2011-01-10 ENCOUNTER — Telehealth: Payer: Self-pay | Admitting: Pulmonary Disease

## 2011-01-10 MED ORDER — BECLOMETHASONE DIPROPIONATE 40 MCG/ACT IN AERS: 2.0000 | INHALATION_SPRAY | Freq: Two times a day (BID) | RESPIRATORY_TRACT | Status: DC

## 2011-01-10 NOTE — Telephone Encounter (Signed)
rx sent to pharmacy

## 2011-02-11 ENCOUNTER — Encounter: Payer: Self-pay | Admitting: Pulmonary Disease

## 2011-02-14 ENCOUNTER — Ambulatory Visit (INDEPENDENT_AMBULATORY_CARE_PROVIDER_SITE_OTHER): Payer: Medicare Other | Admitting: Pulmonary Disease

## 2011-02-14 ENCOUNTER — Encounter: Payer: Self-pay | Admitting: Pulmonary Disease

## 2011-02-14 VITALS — BP 118/64 | HR 67 | Temp 98.3°F | Ht 65.0 in | Wt 166.4 lb

## 2011-02-14 DIAGNOSIS — G4730 Sleep apnea, unspecified: Secondary | ICD-10-CM

## 2011-02-14 DIAGNOSIS — D869 Sarcoidosis, unspecified: Secondary | ICD-10-CM

## 2011-02-14 NOTE — Patient Instructions (Signed)
New mask & tubing Rx will be sent - you are allowed to change every 6 months

## 2011-02-14 NOTE — Progress Notes (Signed)
  Subjective:    Patient ID: Elizabeth Gray, female    DOB: 01/30/1941, 70 y.o.   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 Epworth Sleepiness Score was 12/24  Bedtime 10 p, wakes up at 0430 am to giv eher husband a pill & then sleeps until 0530am.  PSG 11/09 showed predominantly REM related obstructive sleep apnea , correctd by CPAP 12 cm.  Prolonged sleep latency inspite of ambien.Few PLMs esp at sleep onset. PLM arousal index was only 1.9/h >> doubt this is causing her tiredness.  download 12/1-28 /09 >> good compliance, 12 cm   02/14/2011 Annual FU, needs new mask,  mask ok, pressure OK, she now wakes up at 5 a to give her husband meds  denies excessive daytime somnolence    Review of Systems There is no history suggestive of cataplexy, sleep paralysis or parasomnias Patient denies significant dyspnea,cough, hemoptysis,  chest pain, palpitations, pedal edema, orthopnea, paroxysmal nocturnal dyspnea, lightheadedness, nausea, vomiting, abdominal or  leg pains       Objective:   Physical Exam Gen. Pleasant, well-nourished, in no distress ENT - no lesions, no post nasal drip Neck: No JVD, no thyromegaly, no carotid bruits Lungs: no use of accessory muscles, no dullness to percussion, clear without rales or rhonchi  Cardiovascular: Rhythm regular, heart sounds  normal, no murmurs or gallops, no peripheral edema Musculoskeletal: No deformities, no cyanosis or clubbing         Assessment & Plan:

## 2011-02-16 ENCOUNTER — Encounter: Payer: Self-pay | Admitting: Pulmonary Disease

## 2011-02-16 NOTE — Assessment & Plan Note (Signed)
Appears stable 

## 2011-02-16 NOTE — Assessment & Plan Note (Signed)
Weight loss encouraged, compliance with goal of at least 4-6 hrs every night is the expectation. Advised against medications with sedative side effects Cautioned against driving when sleepy - understanding that sleepiness will vary on a day to day basis  

## 2011-02-24 ENCOUNTER — Other Ambulatory Visit: Payer: Self-pay | Admitting: Internal Medicine

## 2011-02-26 ENCOUNTER — Other Ambulatory Visit: Payer: Self-pay | Admitting: Internal Medicine

## 2011-02-27 ENCOUNTER — Other Ambulatory Visit: Payer: Self-pay | Admitting: Internal Medicine

## 2011-02-28 NOTE — Telephone Encounter (Signed)
Patient made an appointment. Medication filled for 1 month until appointment.

## 2011-03-04 ENCOUNTER — Ambulatory Visit: Payer: Medicare Other | Admitting: Critical Care Medicine

## 2011-03-11 ENCOUNTER — Ambulatory Visit (INDEPENDENT_AMBULATORY_CARE_PROVIDER_SITE_OTHER): Payer: Medicare Other | Admitting: Internal Medicine

## 2011-03-11 ENCOUNTER — Other Ambulatory Visit (INDEPENDENT_AMBULATORY_CARE_PROVIDER_SITE_OTHER): Payer: Medicare Other

## 2011-03-11 ENCOUNTER — Other Ambulatory Visit: Payer: Self-pay | Admitting: Critical Care Medicine

## 2011-03-11 ENCOUNTER — Encounter: Payer: Self-pay | Admitting: Internal Medicine

## 2011-03-11 VITALS — BP 128/70 | HR 80 | Temp 98.3°F | Resp 16 | Ht 65.0 in | Wt 162.0 lb

## 2011-03-11 DIAGNOSIS — M79646 Pain in unspecified finger(s): Secondary | ICD-10-CM | POA: Insufficient documentation

## 2011-03-11 DIAGNOSIS — Z0000 Encounter for general adult medical examination without abnormal findings: Secondary | ICD-10-CM

## 2011-03-11 DIAGNOSIS — E538 Deficiency of other specified B group vitamins: Secondary | ICD-10-CM

## 2011-03-11 DIAGNOSIS — M25511 Pain in right shoulder: Secondary | ICD-10-CM

## 2011-03-11 DIAGNOSIS — E559 Vitamin D deficiency, unspecified: Secondary | ICD-10-CM

## 2011-03-11 DIAGNOSIS — M79609 Pain in unspecified limb: Secondary | ICD-10-CM

## 2011-03-11 DIAGNOSIS — Z79899 Other long term (current) drug therapy: Secondary | ICD-10-CM

## 2011-03-11 DIAGNOSIS — Z52008 Unspecified donor, other blood: Secondary | ICD-10-CM | POA: Insufficient documentation

## 2011-03-11 DIAGNOSIS — M25519 Pain in unspecified shoulder: Secondary | ICD-10-CM

## 2011-03-11 LAB — CBC WITH DIFFERENTIAL/PLATELET
Basophils Relative: 0.5 % (ref 0.0–3.0)
Eosinophils Absolute: 0.3 10*3/uL (ref 0.0–0.7)
Lymphs Abs: 2.6 10*3/uL (ref 0.7–4.0)
MCHC: 33.5 g/dL (ref 30.0–36.0)
MCV: 85.8 fl (ref 78.0–100.0)
Monocytes Absolute: 0.9 10*3/uL (ref 0.1–1.0)
Neutro Abs: 5.8 10*3/uL (ref 1.4–7.7)
Neutrophils Relative %: 60.2 % (ref 43.0–77.0)
RBC: 4.54 Mil/uL (ref 3.87–5.11)

## 2011-03-11 LAB — COMPREHENSIVE METABOLIC PANEL
ALT: 26 U/L (ref 0–35)
Albumin: 4.3 g/dL (ref 3.5–5.2)
CO2: 30 mEq/L (ref 19–32)
Calcium: 9.5 mg/dL (ref 8.4–10.5)
Chloride: 105 mEq/L (ref 96–112)
GFR: 80.66 mL/min (ref >60.00)
Potassium: 4.3 mEq/L (ref 3.5–5.1)
Sodium: 143 mEq/L (ref 135–145)
Total Protein: 7.8 g/dL (ref 6.0–8.3)

## 2011-03-11 LAB — VITAMIN B12: Vitamin B-12: 525 pg/mL (ref 211–911)

## 2011-03-11 LAB — URINALYSIS
Hgb urine dipstick: NEGATIVE
Ketones, ur: 15
Specific Gravity, Urine: >=1.03 (ref 1.000–1.030)
Urine Glucose: NEGATIVE
Urobilinogen, UA: 0.2 (ref 0.0–1.0)

## 2011-03-11 LAB — LIPID PANEL
Total CHOL/HDL Ratio: 2
Triglycerides: 74 mg/dL (ref 0.0–149.0)

## 2011-03-11 NOTE — Progress Notes (Signed)
  Subjective:    Patient ID: Elizabeth Gray, female    DOB: 08-31-1940, 70 y.o.   MRN: [id number]  HPI  The patient is here for a [facility] wellness exam. The patient has been doing well overall without major physical or psychological issues going on lately.   Review of Systems  Constitutional: Negative for fever, chills, diaphoresis, activity change, appetite change, fatigue and unexpected weight change.  HENT: Negative for hearing loss, ear pain, congestion, sore throat, sneezing, mouth sores, neck pain, dental problem, voice change, postnasal drip and sinus pressure.   Eyes: Negative for pain and visual disturbance.  Respiratory: Negative for cough, chest tightness, wheezing and stridor.   Cardiovascular: Negative for chest pain, palpitations and leg swelling.  Gastrointestinal: Negative for nausea, vomiting, abdominal pain, blood in stool, abdominal distention and rectal pain.  Genitourinary: Negative for dysuria, hematuria, decreased urine volume, vaginal bleeding, vaginal discharge, difficulty urinating, vaginal pain and menstrual problem.  Musculoskeletal: Positive for arthralgias. Negative for back pain, joint swelling and gait problem.       R shoulder hurts  L thumb hurts too   Skin: Negative for color change, rash and wound.  Neurological: Negative for dizziness, tremors, syncope, speech difficulty and light-headedness.  Hematological: Negative for adenopathy.  Psychiatric/Behavioral: Negative for suicidal ideas, hallucinations, behavioral problems, confusion, sleep disturbance, dysphoric mood and decreased concentration. The patient is not hyperactive.        Objective:   Physical Exam  Constitutional: She appears well-developed and well-nourished. No distress.  HENT:  Head: Normocephalic.  Right Ear: External ear normal.  Left Ear: External ear normal.  Nose: Nose normal.  Mouth/Throat: Oropharynx is clear and moist.  Eyes: Conjunctivae are normal. Pupils are equal, round,  and reactive to light. Right eye exhibits no discharge. Left eye exhibits no discharge.  Neck: Normal range of motion. Neck supple. No JVD present. No tracheal deviation present. No thyromegaly present.  Cardiovascular: Normal rate, regular rhythm and normal heart sounds.   Pulmonary/Chest: No stridor. No respiratory distress. She has no wheezes.  Abdominal: Soft. Bowel sounds are normal. She exhibits no distension and no mass. There is no tenderness. There is no rebound and no guarding.  Musculoskeletal: She exhibits no edema and no tenderness.       R shoulder with subacr pain L palmar thumb dist phal with 4 mm lump  Lymphadenopathy:    She has no cervical adenopathy.  Neurological: She displays normal reflexes. No cranial nerve deficit. She exhibits normal muscle tone. Coordination normal.  Skin: No rash noted. No erythema.  Psychiatric: She has a normal mood and affect. Her behavior is normal. Judgment and thought content normal.   Procedure Note :    Procedure :   Point of care (POC) sonography examination   Indication:   Equipment used: Sonosite M-Turbo with HFL38x/13-6 MHz transducer linear probe. The images were stored in the unit and later transferred in storage.  The patient was placed in a decubitus position.  This study revealed a hypoechoic      cm lesion in   Impression:          Assessment & Plan:

## 2011-03-11 NOTE — Assessment & Plan Note (Signed)
Ruptured cyst w/hematoma

## 2011-03-11 NOTE — Assessment & Plan Note (Signed)
Last time - recent

## 2011-03-11 NOTE — Assessment & Plan Note (Signed)
On Rx 

## 2011-03-13 ENCOUNTER — Telehealth: Payer: Self-pay | Admitting: Internal Medicine

## 2011-03-13 DIAGNOSIS — E039 Hypothyroidism, unspecified: Secondary | ICD-10-CM

## 2011-03-13 MED ORDER — LEVOTHYROXINE SODIUM 100 MCG PO TABS: 100.0000 ug | ORAL_TABLET | Freq: Every day | ORAL | Status: DC

## 2011-03-13 NOTE — Telephone Encounter (Signed)
Elizabeth Gray, please, inform patient that all labs are normal except for  TSH Reduce Levothyrox dose to 100 mcg/d - pls call in or mail  Please, mail the labs to the patient. TSH in 3 mo Thx

## 2011-03-14 MED ORDER — LEVOTHYROXINE SODIUM 100 MCG PO TABS: 100.0000 ug | ORAL_TABLET | Freq: Every day | ORAL | Status: DC

## 2011-03-14 NOTE — Telephone Encounter (Signed)
Pt informed. Lab ordered.Marland KitchenMarland KitchenMarland KitchenMarland Kitchennew to my practice rx sent. Copies mailed

## 2011-03-22 NOTE — Progress Notes (Addendum)
  Subjective:    Patient ID: Elizabeth Gray, female    DOB: [birth year], 70 y.o.   MRN: [id number]  HPI    Review of Systems     Objective:   Physical Exam        Assessment & Plan:     The patient is here for annual Medicare wellness examination and management of other chronic and acute problems.   The risk factors are reflected in the social history.  The roster of all physicians providing medical care to patient - is listed in the Snapshot section of the chart.  Activities of daily living:  The patient is 100% inedpendent in all ADLs: dressing, toileting, feeding as well as independent mobility  Home safety : The patient has smoke detectors in the home. They wear seatbelts.No firearms at home ( firearms are present in the home, kept in a safe fashion). There is no violence in the home.   There is no risks for hepatitis, STDs or HIV. There is no   history of blood transfusion. They have no travel history to infectious disease endemic areas of the world.  The patient has (has not) seen their dentist in the last six month. They have (not) seen their eye doctor in the last year. They deny (admit to) any hearing difficulty and have not had audiologic testing in the last year.  They do not  have excessive sun exposure. Discussed the need for sun protection: hats, long sleeves and use of sunscreen if there is significant sun exposure.   Diet: the importance of a healthy diet is discussed. They do have a healthy (unhealthy-high fat/fast food) diet.  The patient has a regular exercise program: ____walking___ , ____duration, __7___per week.  The benefits of regular aerobic exercise were discussed.  Depression screen: there are no signs or vegative symptoms of depression- irritability, change in appetite, anhedonia, sadness/tearfullness.  Cognitive assessment: the patient manages all their financial and personal affairs and is actively engaged. They could relate day,date,year and  events; recalled 3/3 objects at 3 minutes; performed clock-face test normally.  The following portions of the patient's history were reviewed and updated as appropriate: allergies, current medications, past family history, past medical history,  past surgical history, past social history  and problem list.  Vision, hearing, body mass index were assessed and reviewed.   During the course of the visit the patient was educated and counseled about appropriate screening and preventive services including : fall prevention , diabetes screening, nutrition counseling, colorectal cancer screening, and recommended immunizations.    [place] addendum: 0.5 cm finger hematoma

## 2011-03-28 ENCOUNTER — Ambulatory Visit: Payer: Medicare Other | Admitting: Critical Care Medicine

## 2011-04-01 ENCOUNTER — Encounter: Payer: Self-pay | Admitting: Internal Medicine

## 2011-04-01 ENCOUNTER — Ambulatory Visit (INDEPENDENT_AMBULATORY_CARE_PROVIDER_SITE_OTHER): Payer: Medicare Other | Admitting: Internal Medicine

## 2011-04-01 VITALS — BP 124/68 | HR 60 | Ht 65.0 in | Wt 165.0 lb

## 2011-04-01 DIAGNOSIS — R109 Unspecified abdominal pain: Secondary | ICD-10-CM

## 2011-04-01 DIAGNOSIS — R1010 Upper abdominal pain, unspecified: Secondary | ICD-10-CM

## 2011-04-01 DIAGNOSIS — R1319 Other dysphagia: Secondary | ICD-10-CM

## 2011-04-01 NOTE — Patient Instructions (Signed)
You have been scheduled for an Endoscopy with separate instructions given.  

## 2011-04-01 NOTE — Progress Notes (Signed)
  Subjective:    Patient ID: Elizabeth Gray, female    DOB: 1941/01/02, 70 y.o.   MRN: [id number]  HPI Frequent nausea when she called for the appointment 1 month ago. Also had chest pain worse with swallowing or burping. Has had off and on since but less frequent. Feels like food sits in upper chest at times and the suprasternal notch. Unable to burp despite feeling like she needs to at times.    Review of Systems Some vertigo which always worsens when her stomach bothers her    Objective:   Physical Exam WDWN overweight  Lungs clear Heart s1 s2 no rmg abd soft and nontender        Assessment & Plan:

## 2011-04-02 ENCOUNTER — Encounter: Payer: Self-pay | Admitting: Internal Medicine

## 2011-04-02 DIAGNOSIS — R1319 Other dysphagia: Secondary | ICD-10-CM | POA: Insufficient documentation

## 2011-04-02 DIAGNOSIS — R1010 Upper abdominal pain, unspecified: Secondary | ICD-10-CM | POA: Insufficient documentation

## 2011-04-02 NOTE — Assessment & Plan Note (Addendum)
Some worsening of this. Await EGD Have thought this is functional vs. Pancreatic insufficiency Doubt from sarcoid but keep in mind

## 2011-04-02 NOTE — Assessment & Plan Note (Signed)
Similar to problems in past - she had LE ring dilated in 2008 with relief so will repeat an EGD and plan for esophageal dilation. Some of this or all mat be more of a functional disturbance. She could have mediastinal adenopathy from sarcoid but probably not. If EGD unrevealing and dilation unhelpful could need other imaging.

## 2011-04-15 ENCOUNTER — Encounter: Payer: Self-pay | Admitting: Critical Care Medicine

## 2011-04-15 ENCOUNTER — Ambulatory Visit (INDEPENDENT_AMBULATORY_CARE_PROVIDER_SITE_OTHER): Payer: Medicare Other | Admitting: Critical Care Medicine

## 2011-04-15 VITALS — BP 130/78 | HR 65 | Temp 97.8°F | Ht 65.0 in | Wt 166.2 lb

## 2011-04-15 DIAGNOSIS — D869 Sarcoidosis, unspecified: Secondary | ICD-10-CM

## 2011-04-15 MED ORDER — BECLOMETHASONE DIPROPIONATE 40 MCG/ACT IN AERS: 2.0000 | INHALATION_SPRAY | Freq: Two times a day (BID) | RESPIRATORY_TRACT | Status: DC

## 2011-04-15 NOTE — Progress Notes (Signed)
Subjective:    Patient ID: Elizabeth Gray, female    DOB: 03-Jan-1941, 70 y.o.   MRN: [id number]  HPI This is a 70 year old, white female with stage II sarcoidosis.  tightness.  January 26, 2010 5:13 PM  The pt has been doing well since 1/11. No increase cough. Had bronchitis in 3/11 and saw PCP.  No abx since.  Pt denies any significant sore throat, nasal congestion or excess secretions, fever, chills, sweats, unintended weight loss, pleurtic or exertional chest pain, orthopnea PND, or leg swelling  Pt denies any increase in rescue therapy over baseline, denies waking up needing it or having any early am or nocturnal exacerbations of coughing/wheezing/or dyspnea.  July 09, 2010 9:56 AM  The pt was doing well until she developed a sore throat, these symptoms started over end of 11/11. Notes cough but is dry. Notes pndrip. Notes a sinus headache. Notes low grade fever, worse late afternoon. No mucus out of nose. Notes dyspnea is sl worse. Notes more knee pain. No heartburn on zegerid.   04/15/2011 Sarcoidosis f/u.  Last OV, Now no real cough.  No wheeze.  No mucus.  No edema in feet.  Had chest pain that was d/t indigestion. Pt denies any significant sore throat, nasal congestion or excess secretions, fever, chills, sweats, unintended weight loss, pleurtic or exertional chest pain, orthopnea PND, or leg swelling Pt denies any increase in rescue therapy over baseline, denies waking up needing it or having any early am or nocturnal exacerbations of coughing/wheezing/or dyspnea. Pt also denies any obvious fluctuation in symptoms with  weather or environmental change or other alleviating or aggravating factors   Past Medical History  Diagnosis Date  . Nonorganic sleep disorder, unspecified   . Fatigue   . Pulmonary sarcoidosis   . Hypothyroidism   . Polio 1948  . Vitamin D deficiency   . Vitamin B12 deficiency   . Osteoarthritis   . GERD (gastroesophageal reflux disease)   . Pancreatitis    . Personal history of colonic polyps   . Iron deficiency anemia, unspecified   . Lower esophageal ring 2008    EGD  . External hemorrhoids without mention of complication 2008    Colonoscopy   . Bursitis      Family History  Problem Relation Age of Onset  . Breast cancer Paternal Aunt   . Brain cancer Father   . Colon cancer Neg Hx      History   Social History  . Marital Status: Married    Spouse Name: N/A    Number of Children: N/A  . Years of Education: N/A   Occupational History  . Psychologist, forensic     works three days per week    Social History Main Topics  . Smoking status: Former Smoker -- 1.0 packs/day for 17 years    Types: Cigarettes    Quit date: 08/01/1990  . Smokeless tobacco: Never Used  . Alcohol Use: No  . Drug Use: No  . Sexually Active: Not on file   Other Topics Concern  . Not on file   Social History Narrative  . No narrative on file     Allergies  Allergen Reactions  . Metoclopramide Hcl   . Oxymetazoline Hcl   . Sucralfate      Outpatient Prescriptions Prior to Visit  Medication Sig Dispense Refill  . Alum Hydroxide-Mag Carbonate (GAVISCON PO) As directed as needed       . aspirin 81 MG tablet Take  81 mg by mouth daily.        . calcium-vitamin D (OSCAL WITH D) 500-200 MG-UNIT per tablet Take 3 tablets by mouth every morning.       . Carboxymethylcellul-Glycerin (OPTIVE) 0.5-0.9 % SOLN Apply 1 drop to eye daily as needed.        . Flaxseed, Linseed, (FLAXSEED OIL) OIL Take 1 capsule by mouth 2 (two) times daily.        . Glucosamine-Chondroit-Vit C-Mn (GLUCOSAMINE CHONDR 1500 COMPLX PO) Take 1 tablet by mouth 2 (two) times daily.        Marland Kitchen levothyroxine (SYNTHROID) 100 MCG tablet Take 1 tablet (100 mcg total) by mouth daily.  90 tablet  3  . meclizine (ANTIVERT) 12.5 MG tablet TAKE 1 TO 2 TABLETS 4 TIMES A DAY AS NEEDED DIZZINESS  60 tablet  1  . Multiple Vitamins-Minerals (ICAPS AREDS FORMULA PO) Take 1 capsule by mouth daily.         Marland Kitchen omeprazole-sodium bicarbonate (ZEGERID) 40-1100 MG per capsule ONE BY MOUTH ONCE DAILY  30 capsule  6  . Pancrelipase, Lip-Prot-Amyl, (CREON) 24000 UNITS CPEP Take 1 capsule by mouth 2 (two) times daily.       Bertram Gala Glycol-Propyl Glycol (SYSTANE) 0.4-0.3 % SOLN 1 to 2 drops four times daily as needed       . traMADol (ULTRAM) 50 MG tablet Take 2 tablets by mouth At bedtime as needed.      . vitamin C (ASCORBIC ACID) 500 MG tablet Take 500 mg by mouth daily.        Marland Kitchen zolpidem (AMBIEN) 10 MG tablet Take 10 mg by mouth at bedtime as needed.        Marland Kitchen QVAR 40 MCG/ACT inhaler INHALE 2 PUFFS INTO THE LUNGS TWICE A DAY  8.7 g  1  . vitamin B-12 (CYANOCOBALAMIN) 1000 MCG tablet Take 1,000 mcg by mouth every other day.           Review of Systems Constitutional:   No  weight loss, night sweats,  Fevers, chills, fatigue, lassitude. HEENT:   No headaches,  Difficulty swallowing,  Tooth/dental problems,  Sore throat,                No sneezing, itching, ear ache, nasal congestion, post nasal drip,   CV:  No chest pain,  Orthopnea, PND, swelling in lower extremities, anasarca, dizziness, palpitations  GI  No heartburn, indigestion, abdominal pain, nausea, vomiting, diarrhea, change in bowel habits, loss of appetite  Resp: Notes  shortness of breath with exertion or at rest.  No excess mucus, no productive cough,  No non-productive cough,  No coughing up of blood.  No change in color of mucus.  No wheezing.  No chest wall deformity  Skin: no rash or lesions.  GU: no dysuria, change in color of urine, no urgency or frequency.  No flank pain.  MS:  No joint pain or swelling.  No decreased range of motion.  No back pain.  Psych:  No change in mood or affect. No depression or anxiety.  No memory loss.     Objective:   Physical Exam Filed Vitals:   04/15/11 0929  BP: 130/78  Pulse: 65  Temp: 97.8 F (36.6 C)  TempSrc: Oral  Height: 5\' 5"  (1.651 m)  Weight: 166 lb 3.2 oz (75.388 kg)    SpO2: 98%    Gen: Pleasant, well-nourished, in no distress,  normal affect  ENT: No lesions,  mouth  clear,  oropharynx clear, no postnasal drip  Neck: No JVD, no TMG, no carotid bruits  Lungs: No use of accessory muscles, no dullness to percussion, clear without rales or rhonchi  Cardiovascular: RRR, heart sounds normal, no murmur or gallops, no peripheral edema  Abdomen: soft and NT, no HSM,  BS normal  Musculoskeletal: No deformities, no cyanosis or clubbing  Neuro: alert, non focal  Skin: Warm, no lesions or rashes        Assessment & Plan:   SARCOIDOSIS, PULMONARY Stable sarcoidosis with lower airway inflammation under control Plan Continued inhaled corticosteroid with Qvar    Updated Medication List Outpatient Encounter Prescriptions as of 04/15/2011  Medication Sig Dispense Refill  . Alum Hydroxide-Mag Carbonate (GAVISCON PO) As directed as needed       . aspirin 81 MG tablet Take 81 mg by mouth daily.        . beclomethasone (QVAR) 40 MCG/ACT inhaler Inhale 2 puffs into the lungs 2 (two) times daily.  8.7 g  11  . calcium-vitamin D (OSCAL WITH D) 500-200 MG-UNIT per tablet Take 3 tablets by mouth every morning.       . Carboxymethylcellul-Glycerin (OPTIVE) 0.5-0.9 % SOLN Apply 1 drop to eye daily as needed.        . Flaxseed, Linseed, (FLAXSEED OIL) OIL Take 1 capsule by mouth 2 (two) times daily.        . Glucosamine-Chondroit-Vit C-Mn (GLUCOSAMINE CHONDR 1500 COMPLX PO) Take 1 tablet by mouth 2 (two) times daily.        Marland Kitchen levothyroxine (SYNTHROID) 100 MCG tablet Take 1 tablet (100 mcg total) by mouth daily.  90 tablet  3  . meclizine (ANTIVERT) 12.5 MG tablet TAKE 1 TO 2 TABLETS 4 TIMES A DAY AS NEEDED DIZZINESS  60 tablet  1  . Multiple Vitamins-Minerals (ICAPS AREDS FORMULA PO) Take 1 capsule by mouth daily.        Marland Kitchen omeprazole-sodium bicarbonate (ZEGERID) 40-1100 MG per capsule ONE BY MOUTH ONCE DAILY  30 capsule  6  . Pancrelipase, Lip-Prot-Amyl, (CREON)  24000 UNITS CPEP Take 1 capsule by mouth 2 (two) times daily.       Bertram Gala Glycol-Propyl Glycol (SYSTANE) 0.4-0.3 % SOLN 1 to 2 drops four times daily as needed       . traMADol (ULTRAM) 50 MG tablet Take 2 tablets by mouth At bedtime as needed.      . vitamin C (ASCORBIC ACID) 500 MG tablet Take 500 mg by mouth daily.        Marland Kitchen zolpidem (AMBIEN) 10 MG tablet Take 10 mg by mouth at bedtime as needed.        Marland Kitchen DISCONTD: QVAR 40 MCG/ACT inhaler INHALE 2 PUFFS INTO THE LUNGS TWICE A DAY  8.7 g  1  . DISCONTD: vitamin B-12 (CYANOCOBALAMIN) 1000 MCG tablet Take 1,000 mcg by mouth every other day.

## 2011-04-15 NOTE — Assessment & Plan Note (Signed)
Stable sarcoidosis with lower airway inflammation under control Plan Continued inhaled corticosteroid with Qvar

## 2011-04-15 NOTE — Patient Instructions (Signed)
No change in medications. Return in        6 months        

## 2011-04-18 ENCOUNTER — Ambulatory Visit (HOSPITAL_COMMUNITY)
Admission: RE | Admit: 2011-04-18 | Discharge: 2011-04-18 | Disposition: A | Discharge status: Home / Self Care | Payer: Medicare Other | Source: Ambulatory Visit | Attending: Internal Medicine | Admitting: Internal Medicine

## 2011-04-18 ENCOUNTER — Encounter: Payer: Medicare Other | Admitting: Internal Medicine

## 2011-04-18 ENCOUNTER — Other Ambulatory Visit: Payer: Self-pay | Admitting: Internal Medicine

## 2011-04-18 DIAGNOSIS — R1310 Dysphagia, unspecified: Secondary | ICD-10-CM | POA: Insufficient documentation

## 2011-04-18 DIAGNOSIS — D131 Benign neoplasm of stomach: Secondary | ICD-10-CM | POA: Insufficient documentation

## 2011-04-18 DIAGNOSIS — R109 Unspecified abdominal pain: Secondary | ICD-10-CM | POA: Insufficient documentation

## 2011-04-18 DIAGNOSIS — K222 Esophageal obstruction: Secondary | ICD-10-CM

## 2011-04-18 DIAGNOSIS — K449 Diaphragmatic hernia without obstruction or gangrene: Secondary | ICD-10-CM | POA: Insufficient documentation

## 2011-04-18 DIAGNOSIS — R1319 Other dysphagia: Secondary | ICD-10-CM

## 2011-04-21 ENCOUNTER — Encounter: Payer: Self-pay | Admitting: Internal Medicine

## 2011-04-21 NOTE — Progress Notes (Signed)
Quick Note:  No recall needed. Letter created. ______

## 2011-04-29 ENCOUNTER — Other Ambulatory Visit: Payer: Self-pay | Admitting: Internal Medicine

## 2011-05-10 ENCOUNTER — Other Ambulatory Visit: Payer: Self-pay | Admitting: Critical Care Medicine

## 2011-05-16 LAB — CULTURE, RESPIRATORY W GRAM STAIN

## 2011-05-16 LAB — AFB CULTURE WITH SMEAR (NOT AT ARMC)

## 2011-05-16 LAB — FUNGUS CULTURE W SMEAR: Fungal Smear: NONE SEEN

## 2011-05-19 ENCOUNTER — Telehealth: Payer: Self-pay | Admitting: *Deleted

## 2011-05-19 NOTE — Telephone Encounter (Signed)
Pt left vm stating she has sinus infection. She c/o sinus congestion/yellow mucous. She is requesting zpak. Please advise.

## 2011-05-20 NOTE — Telephone Encounter (Signed)
Ok zpac Thx 

## 2011-05-23 MED ORDER — AZITHROMYCIN 250 MG PO TABS: ORAL_TABLET | ORAL | Status: AC

## 2011-05-23 NOTE — Telephone Encounter (Signed)
Patient informed. 

## 2011-06-02 ENCOUNTER — Ambulatory Visit (INDEPENDENT_AMBULATORY_CARE_PROVIDER_SITE_OTHER): Payer: Medicare Other | Admitting: Internal Medicine

## 2011-06-02 ENCOUNTER — Encounter: Payer: Self-pay | Admitting: Internal Medicine

## 2011-06-02 VITALS — BP 120/78 | HR 88 | Temp 97.0°F | Resp 16 | Wt 164.0 lb

## 2011-06-02 DIAGNOSIS — J321 Chronic frontal sinusitis: Secondary | ICD-10-CM

## 2011-06-02 MED ORDER — AMOXICILLIN-POT CLAVULANATE 875-125 MG PO TABS: 1.0000 | ORAL_TABLET | Freq: Two times a day (BID) | ORAL | Status: AC

## 2011-06-02 MED ORDER — AMOXICILLIN-POT CLAVULANATE 875-125 MG PO TABS: 1.0000 | ORAL_TABLET | Freq: Two times a day (BID) | ORAL | Status: DC

## 2011-06-02 MED ORDER — FLUCONAZOLE 150 MG PO TABS: 150.0000 mg | ORAL_TABLET | Freq: Once | ORAL | Status: AC

## 2011-06-02 NOTE — Assessment & Plan Note (Signed)
Start augmentin 

## 2011-06-02 NOTE — Progress Notes (Signed)
  Subjective:    Patient ID: Elizabeth Gray, female    DOB: May 14, 1941, 70 y.o.   MRN: [id number]  HPI  C/o sinus infection sx's x 3 wks. Z pack did not help  Review of Systems  Constitutional: Positive for chills. Negative for activity change, appetite change, fatigue and unexpected weight change.  HENT: Positive for congestion, rhinorrhea and postnasal drip. Negative for mouth sores and sinus pressure.   Eyes: Negative for visual disturbance.  Respiratory: Negative for cough and chest tightness.   Gastrointestinal: Negative for nausea and abdominal pain.  Genitourinary: Negative for frequency, difficulty urinating and vaginal pain.  Musculoskeletal: Positive for arthralgias. Negative for back pain and gait problem.  Skin: Negative for pallor and rash.  Neurological: Negative for dizziness, tremors, weakness, numbness and headaches.  Psychiatric/Behavioral: Negative for confusion and sleep disturbance.       Objective:   Physical Exam  Constitutional: She appears well-developed and well-nourished. No distress.  HENT:  Head: Normocephalic.  Right Ear: External ear normal.  Left Ear: External ear normal.  Nose: Nose normal.       eryth throat Tender over frontal sinuses  Eyes: Conjunctivae are normal. Pupils are equal, round, and reactive to light. Right eye exhibits no discharge. Left eye exhibits no discharge.  Neck: Normal range of motion. Neck supple. No JVD present. No tracheal deviation present. No thyromegaly present.  Cardiovascular: Normal rate, regular rhythm and normal heart sounds.   Pulmonary/Chest: No stridor. No respiratory distress. She has no wheezes.  Abdominal: Soft. Bowel sounds are normal. She exhibits no distension and no mass. There is no tenderness. There is no rebound and no guarding.  Musculoskeletal: She exhibits no edema and no tenderness.  Lymphadenopathy:    She has no cervical adenopathy.  Neurological: She displays normal reflexes. No cranial  nerve deficit. She exhibits normal muscle tone. Coordination normal.  Skin: No rash noted. No erythema.  Psychiatric: She has a normal mood and affect. Her behavior is normal. Judgment and thought content normal.          Assessment & Plan:

## 2011-06-02 NOTE — Telephone Encounter (Signed)
Pt called stating she completed abx for possible sinus infection and her symptoms have not improved. Advised pt she would need to be seen in the office. Appt scheduled for today at 10:30 with Dr Posey Rea.

## 2011-06-02 NOTE — Patient Instructions (Signed)
Call if problems please  

## 2011-06-03 ENCOUNTER — Encounter: Payer: Self-pay | Admitting: Internal Medicine

## 2011-06-15 ENCOUNTER — Telehealth: Payer: Self-pay | Admitting: *Deleted

## 2011-06-15 DIAGNOSIS — J329 Chronic sinusitis, unspecified: Secondary | ICD-10-CM

## 2011-06-15 NOTE — Telephone Encounter (Signed)
Patient states she has taken [2] rounds of ABX for Sinus infection and she is still having same symptoms. Pt states she is going out of town tomorrow and would like to know if you would send another Rx to her pharmacy [she request Rx for Diflucan as well if given Cipro as it gives her yeast infection].

## 2011-06-16 NOTE — Telephone Encounter (Signed)
Pt informed

## 2011-06-16 NOTE — Telephone Encounter (Signed)
I don't think she needs abx again I will order a sinus CT Thx

## 2011-06-20 ENCOUNTER — Ambulatory Visit (INDEPENDENT_AMBULATORY_CARE_PROVIDER_SITE_OTHER)
Admission: RE | Admit: 2011-06-20 | Discharge: 2011-06-20 | Disposition: A | Discharge status: Home / Self Care | Payer: Medicare Other | Source: Ambulatory Visit | Attending: Internal Medicine | Admitting: Internal Medicine

## 2011-06-20 DIAGNOSIS — J329 Chronic sinusitis, unspecified: Secondary | ICD-10-CM

## 2011-06-22 ENCOUNTER — Telehealth: Payer: Self-pay | Admitting: Internal Medicine

## 2011-06-22 DIAGNOSIS — J342 Deviated nasal septum: Secondary | ICD-10-CM

## 2011-06-22 DIAGNOSIS — J310 Chronic rhinitis: Secondary | ICD-10-CM

## 2011-06-22 NOTE — Telephone Encounter (Signed)
Elizabeth Gray, please, inform patient that her CT  Shows no sinusitis. I would suggest ENT consult  Please, mail the labs to the patient.

## 2011-06-22 NOTE — Telephone Encounter (Signed)
Pt informed. Pt agrees with ENT referral. She prefers Monday or Friday.

## 2011-06-22 NOTE — Telephone Encounter (Signed)
Done. Thx.

## 2011-06-24 ENCOUNTER — Other Ambulatory Visit (INDEPENDENT_AMBULATORY_CARE_PROVIDER_SITE_OTHER): Payer: Medicare Other

## 2011-06-24 DIAGNOSIS — E039 Hypothyroidism, unspecified: Secondary | ICD-10-CM

## 2011-06-24 LAB — TSH: TSH: 0.53 u[IU]/mL (ref 0.35–5.50)

## 2011-06-29 ENCOUNTER — Telehealth: Payer: Self-pay

## 2011-06-29 NOTE — Telephone Encounter (Signed)
Patient called Elizabeth Gray on triage VM stating that she had a CT scan done last week and was advised that a referral for ENT would be placed. She is calling checking the status. Thnaks

## 2011-06-30 NOTE — Telephone Encounter (Signed)
See ref record 11/21 Thx

## 2011-07-01 ENCOUNTER — Other Ambulatory Visit: Payer: Self-pay | Admitting: Internal Medicine

## 2011-07-01 NOTE — Telephone Encounter (Signed)
Spoke to Elizabeth Gray- I advised her [facility] Gavin Pound has faxed notes to [facility] and we are awaiting appt details from them.

## 2011-07-27 ENCOUNTER — Other Ambulatory Visit: Payer: Self-pay | Admitting: Internal Medicine

## 2011-08-08 ENCOUNTER — Telehealth: Payer: Self-pay | Admitting: *Deleted

## 2011-08-08 NOTE — Telephone Encounter (Signed)
Rf req for Zolpidem 10 mg. Please advise ok to Rf?

## 2011-08-08 NOTE — Telephone Encounter (Signed)
OK to fill this prescription with additional refills x5 Thank you!  

## 2011-08-09 MED ORDER — ZOLPIDEM TARTRATE 10 MG PO TABS: 10.0000 mg | ORAL_TABLET | Freq: Every evening | ORAL | Status: DC | PRN

## 2011-08-24 DIAGNOSIS — M1710 Unilateral primary osteoarthritis, unspecified knee: Secondary | ICD-10-CM | POA: Diagnosis not present

## 2011-08-24 DIAGNOSIS — IMO0002 Reserved for concepts with insufficient information to code with codable children: Secondary | ICD-10-CM | POA: Diagnosis not present

## 2011-08-31 ENCOUNTER — Ambulatory Visit (INDEPENDENT_AMBULATORY_CARE_PROVIDER_SITE_OTHER): Payer: Medicare Other | Admitting: Critical Care Medicine

## 2011-08-31 ENCOUNTER — Encounter: Payer: Self-pay | Admitting: Critical Care Medicine

## 2011-08-31 VITALS — BP 144/70 | HR 71 | Temp 98.6°F | Ht 64.0 in | Wt 167.6 lb

## 2011-08-31 DIAGNOSIS — D869 Sarcoidosis, unspecified: Secondary | ICD-10-CM | POA: Diagnosis not present

## 2011-08-31 DIAGNOSIS — Z23 Encounter for immunization: Secondary | ICD-10-CM | POA: Diagnosis not present

## 2011-08-31 NOTE — Assessment & Plan Note (Signed)
Stable sarcoidosis with lower airway inflammation under control Plan Continued inhaled corticosteroid with Qvar  Flu vaccine given

## 2011-08-31 NOTE — Patient Instructions (Signed)
No change in medications. Return in        6 months        

## 2011-08-31 NOTE — Progress Notes (Signed)
Subjective:    Patient ID: Elizabeth Gray, female    DOB: 07-20-1941, 71 y.o.   MRN: [id number]  HPI  This is a 71 year old, white female with stage II sarcoidosis.  tightness.   08/31/2011  C/o pressure and hoarseness over time.  Had CT of sinuses and were neg. No cough, dyspnea is ok.  No chest pain.  No wheezing.   Pt denies any significant sore throat, nasal congestion or excess secretions, fever, chills, sweats, unintended weight loss, pleurtic or exertional chest pain, orthopnea PND, or leg swelling Pt denies any increase in rescue therapy over baseline, denies waking up needing it or having any early am or nocturnal exacerbations of coughing/wheezing/or dyspnea. Pt also denies any obvious fluctuation in symptoms with  weather or environmental change or other alleviating or aggravating factors    Past Medical History  Diagnosis Date  . Nonorganic sleep disorder, unspecified   . Fatigue   . Pulmonary sarcoidosis   . Hypothyroidism   . Polio 1948  . Vitamin D deficiency   . Vitamin B12 deficiency   . Osteoarthritis   . GERD (gastroesophageal reflux disease)   . Pancreatitis   . Personal history of colonic polyps   . Iron deficiency anemia, unspecified   . Lower esophageal ring 2008    EGD  . External hemorrhoids without mention of complication 2008    Colonoscopy   . Bursitis      Family History  Problem Relation Age of Onset  . Breast cancer Paternal Aunt   . Brain cancer Father   . Colon cancer Neg Hx      History   Social History  . Marital Status: Married    Spouse Name: N/A    Number of Children: N/A  . Years of Education: N/A   Occupational History  . Psychologist, forensic     works three days per week    Social History Main Topics  . Smoking status: Former Smoker -- 1.0 packs/day for 17 years    Types: Cigarettes    Quit date: 08/01/1990  . Smokeless tobacco: Never Used  . Alcohol Use: No  . Drug Use: No  . Sexually Active: Not on file   Other  Topics Concern  . Not on file   Social History Narrative  . No narrative on file     Allergies  Allergen Reactions  . Metoclopramide Hcl   . Oxymetazoline Hcl   . Sucralfate      Outpatient Prescriptions Prior to Visit  Medication Sig Dispense Refill  . Alum Hydroxide-Mag Carbonate (GAVISCON PO) As directed as needed       . calcium-vitamin D (OSCAL WITH D) 500-200 MG-UNIT per tablet Take 3 tablets by mouth every morning.       . Carboxymethylcellul-Glycerin (OPTIVE) 0.5-0.9 % SOLN Apply 1 drop to eye daily as needed.        . Flaxseed, Linseed, (FLAXSEED OIL) OIL Take 1 capsule by mouth 2 (two) times daily.        . Glucosamine-Chondroit-Vit C-Mn (GLUCOSAMINE CHONDR 1500 COMPLX PO) Take 1 tablet by mouth 2 (two) times daily.        Marland Kitchen levothyroxine (SYNTHROID) 100 MCG tablet Take 1 tablet (100 mcg total) by mouth daily.  90 tablet  3  . meclizine (ANTIVERT) 12.5 MG tablet TAKE 1 TO 2 TABLETS 4 TIMES A DAY AS NEEDED DIZZINESS  60 tablet  1  . Multiple Vitamins-Minerals (ICAPS AREDS FORMULA PO) Take 1 capsule by mouth  daily.        . omeprazole-sodium bicarbonate (ZEGERID) 40-1100 MG per capsule ONE BY MOUTH ONCE DAILY  30 capsule  6  . Pancrelipase, Lip-Prot-Amyl, (CREON) 24000 UNITS CPEP Take 1 capsule by mouth 2 (two) times daily.       Bertram Gala Glycol-Propyl Glycol (SYSTANE) 0.4-0.3 % SOLN 1 to 2 drops four times daily as needed       . QVAR 40 MCG/ACT inhaler INHALE 2 PUFFS INTO THE LUNGS TWICE A DAY  8.7 g  5  . traMADol (ULTRAM) 50 MG tablet Take 2 tablets by mouth At bedtime as needed.      . vitamin C (ASCORBIC ACID) 500 MG tablet Take 500 mg by mouth daily.        . beclomethasone (QVAR) 40 MCG/ACT inhaler Inhale 2 puffs into the lungs 2 (two) times daily.  8.7 g  11  . zolpidem (AMBIEN) 10 MG tablet Take 1 tablet (10 mg total) by mouth at bedtime as needed.  30 tablet  5  . Pancrelipase, Lip-Prot-Amyl, (CREON) 24000 UNITS CPEP Take 1 capsule (24,000 Units total) by mouth 3  (three) times daily before meals.  90 capsule  3  . aspirin 81 MG tablet Take 81 mg by mouth daily.            Review of Systems  Constitutional:   No  weight loss, night sweats,  Fevers, chills, fatigue, lassitude. HEENT:   No headaches,  Difficulty swallowing,  Tooth/dental problems,  Sore throat,                No sneezing, itching, ear ache, nasal congestion, post nasal drip,   CV:  No chest pain,  Orthopnea, PND, swelling in lower extremities, anasarca, dizziness, palpitations  GI  No heartburn, indigestion, abdominal pain, nausea, vomiting, diarrhea, change in bowel habits, loss of appetite  Resp: Notes  shortness of breath with exertion or at rest.  No excess mucus, no productive cough,  No non-productive cough,  No coughing up of blood.  No change in color of mucus.  No wheezing.  No chest wall deformity  Skin: no rash or lesions.  GU: no dysuria, change in color of urine, no urgency or frequency.  No flank pain.  MS:  No joint pain or swelling.  No decreased range of motion.  No back pain.  Psych:  No change in mood or affect. No depression or anxiety.  No memory loss.     Objective:   Physical Exam  Filed Vitals:   08/31/11 1643  BP: 144/70  Pulse: 71  Temp: 98.6 F (37 C)  TempSrc: Oral  Height: 5\' 4"  (1.626 m)  Weight: 167 lb 9.6 oz (76.023 kg)  SpO2: 97%    Gen: Pleasant, well-nourished, in no distress,  normal affect  ENT: No lesions,  mouth clear,  oropharynx clear, no postnasal drip  Neck: No JVD, no TMG, no carotid bruits  Lungs: No use of accessory muscles, no dullness to percussion, clear without rales or rhonchi  Cardiovascular: RRR, heart sounds normal, no murmur or gallops, no peripheral edema  Abdomen: soft and NT, no HSM,  BS normal  Musculoskeletal: No deformities, no cyanosis or clubbing  Neuro: alert, non focal  Skin: Warm, no lesions or rashes        Assessment & Plan:   SARCOIDOSIS, PULMONARY Stable sarcoidosis with lower  airway inflammation under control Plan Continued inhaled corticosteroid with Qvar  Flu vaccine given  Updated Medication List Outpatient Encounter Prescriptions as of 08/31/2011  Medication Sig Dispense Refill  . Alum Hydroxide-Mag Carbonate (GAVISCON PO) As directed as needed       . calcium-vitamin D (OSCAL WITH D) 500-200 MG-UNIT per tablet Take 3 tablets by mouth every morning.       . Carboxymethylcellul-Glycerin (OPTIVE) 0.5-0.9 % SOLN Apply 1 drop to eye daily as needed.        . Flaxseed, Linseed, (FLAXSEED OIL) OIL Take 1 capsule by mouth 2 (two) times daily.        . Glucosamine-Chondroit-Vit C-Mn (GLUCOSAMINE CHONDR 1500 COMPLX PO) Take 1 tablet by mouth 2 (two) times daily.        Marland Kitchen ibuprofen (ADVIL,MOTRIN) 200 MG tablet Take 800 mg by mouth every morning.      Marland Kitchen levothyroxine (SYNTHROID) 100 MCG tablet Take 1 tablet (100 mcg total) by mouth daily.  90 tablet  3  . meclizine (ANTIVERT) 12.5 MG tablet TAKE 1 TO 2 TABLETS 4 TIMES A DAY AS NEEDED DIZZINESS  60 tablet  1  . Multiple Vitamins-Minerals (ICAPS AREDS FORMULA PO) Take 1 capsule by mouth daily.        Marland Kitchen omeprazole-sodium bicarbonate (ZEGERID) 40-1100 MG per capsule ONE BY MOUTH ONCE DAILY  30 capsule  6  . Pancrelipase, Lip-Prot-Amyl, (CREON) 24000 UNITS CPEP Take 1 capsule by mouth 2 (two) times daily.       Bertram Gala Glycol-Propyl Glycol (SYSTANE) 0.4-0.3 % SOLN 1 to 2 drops four times daily as needed       . QVAR 40 MCG/ACT inhaler INHALE 2 PUFFS INTO THE LUNGS TWICE A DAY  8.7 g  5  . traMADol (ULTRAM) 50 MG tablet Take 2 tablets by mouth At bedtime as needed.      . vitamin C (ASCORBIC ACID) 500 MG tablet Take 500 mg by mouth daily.        Marland Kitchen zolpidem (AMBIEN) 10 MG tablet Take 5 mg by mouth at bedtime as needed.      Marland Kitchen DISCONTD: beclomethasone (QVAR) 40 MCG/ACT inhaler Inhale 2 puffs into the lungs 2 (two) times daily.  8.7 g  11  . DISCONTD: zolpidem (AMBIEN) 10 MG tablet Take 1 tablet (10 mg total) by mouth  at bedtime as needed.  30 tablet  5  . Pancrelipase, Lip-Prot-Amyl, (CREON) 24000 UNITS CPEP Take 1 capsule (24,000 Units total) by mouth 3 (three) times daily before meals.  90 capsule  3  . DISCONTD: aspirin 81 MG tablet Take 81 mg by mouth daily.

## 2011-09-09 DIAGNOSIS — R51 Headache: Secondary | ICD-10-CM | POA: Diagnosis not present

## 2011-09-12 ENCOUNTER — Ambulatory Visit: Payer: Medicare Other | Admitting: Internal Medicine

## 2011-09-16 DIAGNOSIS — R51 Headache: Secondary | ICD-10-CM | POA: Diagnosis not present

## 2011-10-21 ENCOUNTER — Ambulatory Visit: Payer: Medicare Other | Admitting: Internal Medicine

## 2011-10-31 DIAGNOSIS — R51 Headache: Secondary | ICD-10-CM | POA: Diagnosis not present

## 2011-12-27 DIAGNOSIS — M48061 Spinal stenosis, lumbar region without neurogenic claudication: Secondary | ICD-10-CM | POA: Diagnosis not present

## 2012-01-02 ENCOUNTER — Encounter: Payer: Self-pay | Admitting: Internal Medicine

## 2012-01-02 ENCOUNTER — Ambulatory Visit (INDEPENDENT_AMBULATORY_CARE_PROVIDER_SITE_OTHER): Payer: Medicare Other | Admitting: Internal Medicine

## 2012-01-02 VITALS — BP 148/70 | HR 80 | Temp 98.2°F | Resp 16 | Wt 165.0 lb

## 2012-01-02 DIAGNOSIS — E039 Hypothyroidism, unspecified: Secondary | ICD-10-CM | POA: Diagnosis not present

## 2012-01-02 DIAGNOSIS — E559 Vitamin D deficiency, unspecified: Secondary | ICD-10-CM | POA: Diagnosis not present

## 2012-01-02 DIAGNOSIS — E538 Deficiency of other specified B group vitamins: Secondary | ICD-10-CM | POA: Diagnosis not present

## 2012-01-02 DIAGNOSIS — J309 Allergic rhinitis, unspecified: Secondary | ICD-10-CM | POA: Diagnosis not present

## 2012-01-02 NOTE — Assessment & Plan Note (Signed)
Continue with current prescription therapy as reflected on the Med list.  

## 2012-01-02 NOTE — Assessment & Plan Note (Addendum)
Continue with prn therapy as reflected on the Med list. ST seems to be related to allergies

## 2012-01-03 ENCOUNTER — Encounter: Payer: Self-pay | Admitting: Internal Medicine

## 2012-01-03 NOTE — Progress Notes (Signed)
  Subjective:    Patient ID: Elizabeth Gray, female    DOB: 10-12-1940, 71 y.o.   MRN: [id number]  HPI  The patient presents for a follow-up of  chronic hypertension, chronic dyslipidemia, allergies, B12 deficiency controlled with medicines. C/o ST and runny nose  Wt Readings from Last 3 Encounters:  01/02/12 165 lb (74.844 kg)  08/31/11 167 lb 9.6 oz (76.023 kg)  06/02/11 164 lb (74.39 kg)   BP Readings from Last 3 Encounters:  01/02/12 148/70  08/31/11 144/70  06/02/11 120/78       Review of Systems  Constitutional: Negative for chills, activity change, appetite change, fatigue and unexpected weight change.  HENT: Positive for rhinorrhea. Negative for congestion, mouth sores and sinus pressure.   Eyes: Negative for visual disturbance.  Respiratory: Negative for cough and chest tightness.   Gastrointestinal: Negative for nausea and abdominal pain.  Genitourinary: Negative for frequency, difficulty urinating and vaginal pain.  Musculoskeletal: Positive for arthralgias. Negative for back pain and gait problem.  Skin: Negative for pallor and rash.  Neurological: Negative for dizziness, tremors, weakness, numbness and headaches.  Psychiatric/Behavioral: Negative for confusion and sleep disturbance.       Objective:   Physical Exam  Constitutional: She appears well-developed. No distress.       obese  HENT:  Head: Normocephalic.  Right Ear: External ear normal.  Left Ear: External ear normal.  Nose: Nose normal.  Mouth/Throat: Oropharynx is clear and moist.  Eyes: Conjunctivae are normal. Pupils are equal, round, and reactive to light. Right eye exhibits no discharge. Left eye exhibits no discharge.  Neck: Normal range of motion. Neck supple. No JVD present. No tracheal deviation present. No thyromegaly present.  Cardiovascular: Normal rate, regular rhythm and normal heart sounds.   Pulmonary/Chest: No stridor. No respiratory distress. She has no wheezes.  Abdominal:  Soft. Bowel sounds are normal. She exhibits no distension and no mass. There is no tenderness. There is no rebound and no guarding.  Musculoskeletal: She exhibits tenderness (LS spine). She exhibits no edema.  Lymphadenopathy:    She has no cervical adenopathy.  Neurological: She displays normal reflexes. No cranial nerve deficit. She exhibits normal muscle tone. Coordination normal.  Skin: No rash noted. No erythema.  Psychiatric: She has a normal mood and affect. Her behavior is normal. Judgment and thought content normal.    Lab Results  Component Value Date   WBC 9.7 03/11/2011   HGB 13.1 03/11/2011   HCT 39.0 03/11/2011   PLT 396.0 03/11/2011   GLUCOSE 100* 03/11/2011   CHOL 170 03/11/2011   TRIG 74.0 03/11/2011   HDL 75.70 03/11/2011   LDLCALC 80 03/11/2011   ALT 26 03/11/2011   AST 27 03/11/2011   NA 143 03/11/2011   K 4.3 03/11/2011   CL 105 03/11/2011   CREATININE 0.8 03/11/2011   BUN 27* 03/11/2011   CO2 30 03/11/2011   TSH 0.53 06/24/2011         Assessment & Plan:

## 2012-01-07 ENCOUNTER — Other Ambulatory Visit: Payer: Self-pay | Admitting: Internal Medicine

## 2012-01-11 DIAGNOSIS — M48061 Spinal stenosis, lumbar region without neurogenic claudication: Secondary | ICD-10-CM | POA: Diagnosis not present

## 2012-02-07 DIAGNOSIS — M48061 Spinal stenosis, lumbar region without neurogenic claudication: Secondary | ICD-10-CM | POA: Diagnosis not present

## 2012-02-10 DIAGNOSIS — M48061 Spinal stenosis, lumbar region without neurogenic claudication: Secondary | ICD-10-CM | POA: Diagnosis not present

## 2012-02-14 ENCOUNTER — Other Ambulatory Visit: Payer: Self-pay | Admitting: Internal Medicine

## 2012-02-23 DIAGNOSIS — M48061 Spinal stenosis, lumbar region without neurogenic claudication: Secondary | ICD-10-CM | POA: Diagnosis not present

## 2012-02-27 ENCOUNTER — Ambulatory Visit (INDEPENDENT_AMBULATORY_CARE_PROVIDER_SITE_OTHER): Payer: Medicare Other | Admitting: Critical Care Medicine

## 2012-02-27 ENCOUNTER — Encounter: Payer: Self-pay | Admitting: Critical Care Medicine

## 2012-02-27 VITALS — BP 122/78 | HR 65 | Temp 98.0°F | Ht 65.0 in | Wt 162.4 lb

## 2012-02-27 DIAGNOSIS — D869 Sarcoidosis, unspecified: Secondary | ICD-10-CM | POA: Diagnosis not present

## 2012-02-27 MED ORDER — BECLOMETHASONE DIPROPIONATE 40 MCG/ACT IN AERS: 2.0000 | INHALATION_SPRAY | Freq: Two times a day (BID) | RESPIRATORY_TRACT | Status: DC

## 2012-02-27 NOTE — Patient Instructions (Addendum)
No change in Qvar Return 6 months

## 2012-02-27 NOTE — Progress Notes (Signed)
Subjective:    Patient ID: Elizabeth Gray, female    DOB: [birth year], 71 y.o.   MRN: [id number]  HPI  This is a 71 year old, white female with stage II sarcoidosis.  tightness.   08/31/2011  C/o pressure and hoarseness over time.  Had CT of sinuses and were neg. No cough, dyspnea is ok.  No chest pain.  No wheezing.   Pt denies any significant sore throat, nasal congestion or excess secretions, fever, chills, sweats, unintended weight loss, pleurtic or exertional chest pain, orthopnea PND, or leg swelling Pt denies any increase in rescue therapy over baseline, denies waking up needing it or having any early am or nocturnal exacerbations of coughing/wheezing/or dyspnea. Pt also denies any obvious fluctuation in symptoms with  weather or environmental change or other alleviating or aggravating factors   02/27/2012  No real changes since 1/13.  Pt active with a friend's dog.  No cough.  Dyspnea is stable No ABX since 1/13.   Pt denies any significant sore throat, nasal congestion or excess secretions, fever, chills, sweats, unintended weight loss, pleurtic or exertional chest pain, orthopnea PND, or leg swelling Pt denies any increase in rescue therapy over baseline, denies waking up needing it or having any early am or nocturnal exacerbations of coughing/wheezing/or dyspnea. Pt also denies any obvious fluctuation in symptoms with  weather or environmental change or other alleviating or aggravating factors    Past Medical History  Diagnosis Date  . Nonorganic sleep disorder, unspecified   . Fatigue   . Pulmonary sarcoidosis   . Hypothyroidism   . Polio 1948  . Vitamin d deficiency   . Vitamin B12 deficiency   . Osteoarthritis   . GERD (gastroesophageal reflux disease)   . Pancreatitis   . Personal history of colonic polyps   . Iron deficiency anemia, unspecified   . Lower esophageal ring 2008    EGD  . External hemorrhoids without mention of complication 2008    Colonoscopy    . Bursitis      Family History  Problem Relation Age of Onset  . Breast cancer Paternal Aunt   . Brain cancer Father   . Colon cancer Neg Hx      History   Social History  . Marital Status: Married    Spouse Name: N/A    Number of Children: N/A  . Years of Education: N/A   Occupational History  . Psychologist, forensic     works three days per week    Social History Main Topics  . Smoking status: Former Smoker -- 1.0 packs/day for 17 years    Types: Cigarettes    Quit date: 08/01/1990  . Smokeless tobacco: Never Used  . Alcohol Use: No  . Drug Use: No  . Sexually Active: Not on file   Other Topics Concern  . Not on file   Social History Narrative  . No narrative on file     Allergies  Allergen Reactions  . Metoclopramide Hcl   . Oxymetazoline Hcl   . Sucralfate      Outpatient Prescriptions Prior to Visit  Medication Sig Dispense Refill  . Alum Hydroxide-Mag Carbonate (GAVISCON PO) As directed as needed       . Carboxymethylcellul-Glycerin (OPTIVE) 0.5-0.9 % SOLN Apply 1 drop to eye daily as needed.        . Flaxseed, Linseed, (FLAXSEED OIL) OIL Take 1 capsule by mouth 2 (two) times daily.        . Glucosamine-Chondroit-Vit C-Mn (  GLUCOSAMINE CHONDR 1500 COMPLX PO) Take 1 tablet by mouth 2 (two) times daily.        Marland Kitchen levothyroxine (SYNTHROID, LEVOTHROID) 100 MCG tablet TAKE 1 TABLET EVERY DAY  90 tablet  3  . meclizine (ANTIVERT) 12.5 MG tablet TAKE 1 TO 2 TABLETS 4 TIMES A DAY AS NEEDED DIZZINESS  60 tablet  1  . Multiple Vitamins-Minerals (ICAPS AREDS FORMULA PO) Take 1 capsule by mouth daily.        Marland Kitchen omeprazole-sodium bicarbonate (ZEGERID) 40-1100 MG per capsule ONE BY MOUTH ONCE DAILY  30 capsule  6  . Polyethyl Glycol-Propyl Glycol (SYSTANE) 0.4-0.3 % SOLN 1 to 2 drops four times daily as needed       . topiramate (TOPAMAX) 100 MG tablet Take 100 mg by mouth at bedtime.      . traMADol (ULTRAM) 50 MG tablet Take 2 tablets by mouth at bedtime.       . vitamin  C (ASCORBIC ACID) 500 MG tablet Take 500 mg by mouth daily.        Marland Kitchen zolpidem (AMBIEN) 10 MG tablet Take 5 mg by mouth at bedtime as needed.      Marland Kitchen CREON 24000 UNITS CPEP TAKE 1 CAPSULE 3 TIMES A DAY BEFORE MEALS **MUST MAKE APPT BEFORE ANY MORE REFILLS**  90 capsule  3  . QVAR 40 MCG/ACT inhaler INHALE 2 PUFFS INTO THE LUNGS TWICE A DAY  8.7 g  5  . calcium-vitamin D (OSCAL WITH D) 500-200 MG-UNIT per tablet Take 3 tablets by mouth every morning.       Marland Kitchen ibuprofen (ADVIL,MOTRIN) 200 MG tablet Take 800 mg by mouth every morning.          Review of Systems  Constitutional:   No  weight loss, night sweats,  Fevers, chills, fatigue, lassitude. HEENT:   No headaches,  Difficulty swallowing,  Tooth/dental problems,  Sore throat,                No sneezing, itching, ear ache, nasal congestion, post nasal drip,   CV:  No chest pain,  Orthopnea, PND, swelling in lower extremities, anasarca, dizziness, palpitations  GI  No heartburn, indigestion, abdominal pain, nausea, vomiting, diarrhea, change in bowel habits, loss of appetite  Resp: Notes  shortness of breath with exertion or at rest.  No excess mucus, no productive cough,  No non-productive cough,  No coughing up of blood.  No change in color of mucus.  No wheezing.  No chest wall deformity  Skin: no rash or lesions.  GU: no dysuria, change in color of urine, no urgency or frequency.  No flank pain.  MS:  No joint pain or swelling.  No decreased range of motion.  No back pain.  Psych:  No change in mood or affect. No depression or anxiety.  No memory loss.     Objective:   Physical Exam  Filed Vitals:   02/27/12 1500  BP: 122/78  Pulse: 65  Temp: 98 F (36.7 C)  TempSrc: Oral  Height: 5\' 5"  (1.651 m)  Weight: 162 lb 6.4 oz (73.664 kg)  SpO2: 96%    Gen: Pleasant, well-nourished, in no distress,  normal affect  ENT: No lesions,  mouth clear,  oropharynx clear, no postnasal drip  Neck: No JVD, no TMG, no carotid  bruits  Lungs: No use of accessory muscles, no dullness to percussion, clear without rales or rhonchi  Cardiovascular: RRR, heart sounds normal, no murmur or gallops, no  peripheral edema  Abdomen: soft and NT, no HSM,  BS normal  Musculoskeletal: No deformities, no cyanosis or clubbing  Neuro: alert, non focal  Skin: Warm, no lesions or rashes        Assessment & Plan:   SARCOIDOSIS, PULMONARY Pulmonary sarcoidosis stable at this time Endobronchial sarcoid documented the past responsive to inhaled steroid Plan Maintain inhaled Qvar as prescribed    Updated Medication List Outpatient Encounter Prescriptions as of 02/27/2012  Medication Sig Dispense Refill  . Alum Hydroxide-Mag Carbonate (GAVISCON PO) As directed as needed       . beclomethasone (QVAR) 40 MCG/ACT inhaler Inhale 2 puffs into the lungs 2 (two) times daily.  8.7 g  11  . Carboxymethylcellul-Glycerin (OPTIVE) 0.5-0.9 % SOLN Apply 1 drop to eye daily as needed.        . Cholecalciferol (VITAMIN D PO) Take by mouth daily.      . Flaxseed, Linseed, (FLAXSEED OIL) OIL Take 1 capsule by mouth 2 (two) times daily.        . Glucosamine-Chondroit-Vit C-Mn (GLUCOSAMINE CHONDR 1500 COMPLX PO) Take 1 tablet by mouth 2 (two) times daily.        Marland Kitchen levothyroxine (SYNTHROID, LEVOTHROID) 100 MCG tablet TAKE 1 TABLET EVERY DAY  90 tablet  3  . meclizine (ANTIVERT) 12.5 MG tablet TAKE 1 TO 2 TABLETS 4 TIMES A DAY AS NEEDED DIZZINESS  60 tablet  1  . methocarbamol (ROBAXIN) 500 MG tablet Take 500 mg by mouth at bedtime.       . Multiple Vitamins-Minerals (ICAPS AREDS FORMULA PO) Take 1 capsule by mouth daily.        Marland Kitchen omeprazole-sodium bicarbonate (ZEGERID) 40-1100 MG per capsule ONE BY MOUTH ONCE DAILY  30 capsule  6  . Pancrelipase, Lip-Prot-Amyl, (CREON) 24000 UNITS CPEP       . Polyethyl Glycol-Propyl Glycol (SYSTANE) 0.4-0.3 % SOLN 1 to 2 drops four times daily as needed       . topiramate (TOPAMAX) 100 MG tablet Take 100 mg  by mouth at bedtime.      . traMADol (ULTRAM) 50 MG tablet Take 2 tablets by mouth at bedtime.       . vitamin C (ASCORBIC ACID) 500 MG tablet Take 500 mg by mouth daily.        Marland Kitchen zolpidem (AMBIEN) 10 MG tablet Take 5 mg by mouth at bedtime as needed.      Marland Kitchen DISCONTD: CREON 24000 UNITS CPEP TAKE 1 CAPSULE 3 TIMES A DAY BEFORE MEALS **MUST MAKE APPT BEFORE ANY MORE REFILLS**  90 capsule  3  . DISCONTD: QVAR 40 MCG/ACT inhaler INHALE 2 PUFFS INTO THE LUNGS TWICE A DAY  8.7 g  5  . DISCONTD: calcium-vitamin D (OSCAL WITH D) 500-200 MG-UNIT per tablet Take 3 tablets by mouth every morning.       Marland Kitchen DISCONTD: ibuprofen (ADVIL,MOTRIN) 200 MG tablet Take 800 mg by mouth every morning.

## 2012-02-27 NOTE — Assessment & Plan Note (Signed)
Pulmonary sarcoidosis stable at this time Endobronchial sarcoid documented the past responsive to inhaled steroid Plan Maintain inhaled Qvar as prescribed

## 2012-03-05 DIAGNOSIS — R51 Headache: Secondary | ICD-10-CM | POA: Diagnosis not present

## 2012-03-26 ENCOUNTER — Ambulatory Visit: Payer: Medicare Other | Attending: Orthopedic Surgery | Admitting: Physical Therapy

## 2012-03-26 ENCOUNTER — Ambulatory Visit (INDEPENDENT_AMBULATORY_CARE_PROVIDER_SITE_OTHER): Payer: Medicare Other | Admitting: Pulmonary Disease

## 2012-03-26 ENCOUNTER — Encounter: Payer: Self-pay | Admitting: Pulmonary Disease

## 2012-03-26 VITALS — BP 110/60 | HR 67 | Temp 98.3°F | Ht 64.5 in | Wt 161.8 lb

## 2012-03-26 DIAGNOSIS — IMO0001 Reserved for inherently not codable concepts without codable children: Secondary | ICD-10-CM | POA: Insufficient documentation

## 2012-03-26 DIAGNOSIS — M545 Low back pain, unspecified: Secondary | ICD-10-CM | POA: Diagnosis not present

## 2012-03-26 DIAGNOSIS — G4730 Sleep apnea, unspecified: Secondary | ICD-10-CM | POA: Diagnosis not present

## 2012-03-26 DIAGNOSIS — R5381 Other malaise: Secondary | ICD-10-CM | POA: Insufficient documentation

## 2012-03-26 NOTE — Patient Instructions (Addendum)
Resume CPAP supplies

## 2012-03-26 NOTE — Assessment & Plan Note (Signed)
predominantly REM related obstructive sleep apnea , correctd by CPAP 12 cm.  Weight loss encouraged, compliance with goal of at least 4-6 hrs every night is the expectation. Advised against medications with sedative side effects Cautioned against driving when sleepy - understanding that sleepiness will vary on a day to day basis

## 2012-03-26 NOTE — Progress Notes (Signed)
  Subjective:    Patient ID: Elizabeth Gray, female    DOB: 08-Nov-1940, 71 y.o.   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 Epworth Sleepiness Score was 12/24  Bedtime 10 p, wakes up at 0430 am to giveher husband a pill & then sleeps until 0530am.  PSG 11/09 showed predominantly REM related obstructive sleep apnea , correctd by CPAP 12 cm.  Prolonged sleep latency inspite of ambien.Few PLMs esp at sleep onset. PLM arousal index was only 1.9/h >> doubt this is causing her tiredness.  download 12/1-28 /09 >> good compliance, 12 cm    03/26/2012 Annual FU  mask ok - some pressure lines on face, pressure OK, she wakes up at 5 a to give her husband meds  Takes 5 mg ambien at dinner 8p, bedtime 10p denies excessive daytime somnolence    Review of Systems neg for any significant sore throat, dysphagia, itching, sneezing, nasal congestion or excess/ purulent secretions, fever, chills, sweats, unintended wt loss, pleuritic or exertional cp, hempoptysis, orthopnea pnd or change in chronic leg swelling. Also denies presyncope, palpitations, heartburn, abdominal pain, nausea, vomiting, diarrhea or change in bowel or urinary habits, dysuria,hematuria, rash, arthralgias, visual complaints, headache, numbness weakness or ataxia.     Objective:   Physical Exam  Gen. Pleasant, well-nourished, in no distress ENT - no lesions, no post nasal drip Neck: No JVD, no thyromegaly, no carotid bruits Lungs: no use of accessory muscles, no dullness to percussion, clear without rales or rhonchi  Cardiovascular: Rhythm regular, heart sounds  normal, no murmurs or gallops, no peripheral edema Musculoskeletal: No deformities, no cyanosis or clubbing        Assessment & Plan:

## 2012-03-30 ENCOUNTER — Ambulatory Visit: Payer: Medicare Other | Admitting: Physical Therapy

## 2012-04-03 ENCOUNTER — Ambulatory Visit: Payer: Medicare Other | Attending: Orthopedic Surgery | Admitting: Physical Therapy

## 2012-04-03 DIAGNOSIS — R5381 Other malaise: Secondary | ICD-10-CM | POA: Diagnosis not present

## 2012-04-03 DIAGNOSIS — M545 Low back pain, unspecified: Secondary | ICD-10-CM | POA: Insufficient documentation

## 2012-04-03 DIAGNOSIS — IMO0001 Reserved for inherently not codable concepts without codable children: Secondary | ICD-10-CM | POA: Insufficient documentation

## 2012-04-06 ENCOUNTER — Ambulatory Visit: Payer: Medicare Other | Admitting: Physical Therapy

## 2012-04-09 ENCOUNTER — Ambulatory Visit: Payer: Medicare Other | Admitting: Physical Therapy

## 2012-04-13 ENCOUNTER — Ambulatory Visit: Payer: Medicare Other | Admitting: Physical Therapy

## 2012-04-16 ENCOUNTER — Ambulatory Visit: Payer: Medicare Other | Admitting: Physical Therapy

## 2012-04-20 ENCOUNTER — Ambulatory Visit: Payer: Medicare Other | Admitting: Physical Therapy

## 2012-04-23 ENCOUNTER — Ambulatory Visit: Payer: Medicare Other | Admitting: Physical Therapy

## 2012-04-27 ENCOUNTER — Ambulatory Visit: Payer: Medicare Other | Admitting: Physical Therapy

## 2012-04-30 ENCOUNTER — Ambulatory Visit: Payer: Medicare Other | Attending: Orthopedic Surgery | Admitting: Physical Therapy

## 2012-04-30 DIAGNOSIS — R5381 Other malaise: Secondary | ICD-10-CM | POA: Diagnosis not present

## 2012-04-30 DIAGNOSIS — M545 Low back pain, unspecified: Secondary | ICD-10-CM | POA: Diagnosis not present

## 2012-04-30 DIAGNOSIS — IMO0001 Reserved for inherently not codable concepts without codable children: Secondary | ICD-10-CM | POA: Insufficient documentation

## 2012-05-04 ENCOUNTER — Ambulatory Visit: Payer: Medicare Other | Attending: Orthopedic Surgery | Admitting: Physical Therapy

## 2012-05-04 DIAGNOSIS — IMO0001 Reserved for inherently not codable concepts without codable children: Secondary | ICD-10-CM | POA: Diagnosis not present

## 2012-05-04 DIAGNOSIS — R5381 Other malaise: Secondary | ICD-10-CM | POA: Insufficient documentation

## 2012-05-04 DIAGNOSIS — M545 Low back pain, unspecified: Secondary | ICD-10-CM | POA: Diagnosis not present

## 2012-05-07 ENCOUNTER — Other Ambulatory Visit: Payer: Self-pay | Admitting: Internal Medicine

## 2012-05-07 NOTE — Telephone Encounter (Signed)
Ok to Rf? 

## 2012-05-14 DIAGNOSIS — Z1231 Encounter for screening mammogram for malignant neoplasm of breast: Secondary | ICD-10-CM | POA: Diagnosis not present

## 2012-05-14 DIAGNOSIS — Z803 Family history of malignant neoplasm of breast: Secondary | ICD-10-CM | POA: Diagnosis not present

## 2012-05-25 ENCOUNTER — Ambulatory Visit (INDEPENDENT_AMBULATORY_CARE_PROVIDER_SITE_OTHER): Payer: Medicare Other | Admitting: Internal Medicine

## 2012-05-25 ENCOUNTER — Other Ambulatory Visit (INDEPENDENT_AMBULATORY_CARE_PROVIDER_SITE_OTHER): Payer: Medicare Other

## 2012-05-25 ENCOUNTER — Encounter: Payer: Self-pay | Admitting: Internal Medicine

## 2012-05-25 VITALS — BP 130/70 | HR 80 | Temp 97.3°F | Resp 16 | Ht 63.0 in | Wt 163.0 lb

## 2012-05-25 DIAGNOSIS — Z136 Encounter for screening for cardiovascular disorders: Secondary | ICD-10-CM | POA: Diagnosis not present

## 2012-05-25 DIAGNOSIS — Z23 Encounter for immunization: Secondary | ICD-10-CM

## 2012-05-25 DIAGNOSIS — E538 Deficiency of other specified B group vitamins: Secondary | ICD-10-CM

## 2012-05-25 DIAGNOSIS — J309 Allergic rhinitis, unspecified: Secondary | ICD-10-CM

## 2012-05-25 DIAGNOSIS — Z52008 Unspecified donor, other blood: Secondary | ICD-10-CM

## 2012-05-25 DIAGNOSIS — K219 Gastro-esophageal reflux disease without esophagitis: Secondary | ICD-10-CM | POA: Diagnosis not present

## 2012-05-25 DIAGNOSIS — E039 Hypothyroidism, unspecified: Secondary | ICD-10-CM

## 2012-05-25 DIAGNOSIS — Z0000 Encounter for general adult medical examination without abnormal findings: Secondary | ICD-10-CM | POA: Diagnosis not present

## 2012-05-25 LAB — CBC WITH DIFFERENTIAL/PLATELET
Basophils Absolute: 0.1 10*3/uL (ref 0.0–0.1)
Eosinophils Absolute: 0.2 10*3/uL (ref 0.0–0.7)
Lymphocytes Relative: 30.1 % (ref 12.0–46.0)
MCHC: 33 g/dL (ref 30.0–36.0)
MCV: 88.2 fl (ref 78.0–100.0)
Monocytes Absolute: 0.8 10*3/uL (ref 0.1–1.0)
Neutrophils Relative %: 57.2 % (ref 43.0–77.0)
Platelets: 360 10*3/uL (ref 150.0–400.0)
RBC: 4.71 Mil/uL (ref 3.87–5.11)
RDW: 13.9 % (ref 11.5–14.6)

## 2012-05-25 LAB — HEPATIC FUNCTION PANEL
Albumin: 4.1 g/dL (ref 3.5–5.2)
Alkaline Phosphatase: 73 U/L (ref 39–117)
Total Bilirubin: 0.6 mg/dL (ref 0.3–1.2)

## 2012-05-25 LAB — URINALYSIS
Leukocytes, UA: NEGATIVE
Specific Gravity, Urine: 1.02 (ref 1.000–1.030)
Urine Glucose: NEGATIVE
Urobilinogen, UA: 0.2 (ref 0.0–1.0)

## 2012-05-25 LAB — BASIC METABOLIC PANEL
Calcium: 9.5 mg/dL (ref 8.4–10.5)
Creatinine, Ser: 0.9 mg/dL (ref 0.4–1.2)
Glucose, Bld: 93 mg/dL (ref 70–99)
Potassium: 3.8 mEq/L (ref 3.5–5.1)
Sodium: 142 mEq/L (ref 135–145)

## 2012-05-25 LAB — LIPID PANEL
LDL Cholesterol: 93 mg/dL (ref 0–99)
Total CHOL/HDL Ratio: 3
Triglycerides: 82 mg/dL (ref 0.0–149.0)

## 2012-05-25 LAB — VITAMIN B12: Vitamin B-12: 144 pg/mL — ABNORMAL LOW (ref 211–911)

## 2012-05-25 NOTE — Progress Notes (Signed)
   Subjective:    Patient ID: Elizabeth Gray, female    DOB: 03/07/1941, 71 y.o.   MRN: [id number]  HPI The patient is here for a wellness exam. The patient has been doing well overall without major physical or psychological issues going on lately.  The patient presents for a follow-up of  chronic hypertension, chronic dyslipidemia, allergies, B12 deficiency controlled with medicines. C/o ST and runny nose  Wt Readings from Last 3 Encounters:  05/25/12 163 lb (73.936 kg)  03/26/12 161 lb 12.8 oz (73.392 kg)  02/27/12 162 lb 6.4 oz (73.664 kg)   BP Readings from Last 3 Encounters:  05/25/12 130/70  03/26/12 110/60  02/27/12 122/78       Review of Systems  Constitutional: Negative for chills, activity change, appetite change, fatigue and unexpected weight change.  HENT: Positive for rhinorrhea. Negative for congestion, mouth sores and sinus pressure.   Eyes: Negative for visual disturbance.  Respiratory: Negative for cough and chest tightness.   Gastrointestinal: Negative for nausea and abdominal pain.  Genitourinary: Negative for frequency, difficulty urinating and vaginal pain.  Musculoskeletal: Positive for arthralgias. Negative for back pain and gait problem.  Skin: Negative for pallor and rash.  Neurological: Negative for dizziness, tremors, weakness, numbness and headaches.  Psychiatric/Behavioral: Negative for confusion and disturbed wake/sleep cycle.       Objective:   Physical Exam  Constitutional: She appears well-developed. No distress.       obese  HENT:  Head: Normocephalic.  Right Ear: External ear normal.  Left Ear: External ear normal.  Nose: Nose normal.  Mouth/Throat: Oropharynx is clear and moist.  Eyes: Conjunctivae normal are normal. Pupils are equal, round, and reactive to light. Right eye exhibits no discharge. Left eye exhibits no discharge.  Neck: Normal range of motion. Neck supple. No JVD present. No tracheal deviation present. No thyromegaly  present.  Cardiovascular: Normal rate, regular rhythm and normal heart sounds.   Pulmonary/Chest: No stridor. No respiratory distress. She has no wheezes.  Abdominal: Soft. Bowel sounds are normal. She exhibits no distension and no mass. There is no tenderness. There is no rebound and no guarding.  Musculoskeletal: She exhibits tenderness (LS spine). She exhibits no edema.  Lymphadenopathy:    She has no cervical adenopathy.  Neurological: She displays normal reflexes. No cranial nerve deficit. She exhibits normal muscle tone. Coordination normal.  Skin: No rash noted. No erythema.  Psychiatric: She has a normal mood and affect. Her behavior is normal. Judgment and thought content normal.    Lab Results  Component Value Date   WBC 9.7 03/11/2011   HGB 13.1 03/11/2011   HCT 39.0 03/11/2011   PLT 396.0 03/11/2011   GLUCOSE 100* 03/11/2011   CHOL 170 03/11/2011   TRIG 74.0 03/11/2011   HDL 75.70 03/11/2011   LDLCALC 80 03/11/2011   ALT 26 03/11/2011   AST 27 03/11/2011   NA 143 03/11/2011   K 4.3 03/11/2011   CL 105 03/11/2011   CREATININE 0.8 03/11/2011   BUN 27* 03/11/2011   CO2 30 03/11/2011   TSH 0.53 06/24/2011         Assessment & Plan:

## 2012-05-25 NOTE — Assessment & Plan Note (Signed)
cbc

## 2012-05-25 NOTE — Assessment & Plan Note (Signed)
Continue with current prescription therapy as reflected on the Med list.  

## 2012-05-25 NOTE — Assessment & Plan Note (Signed)

## 2012-05-25 NOTE — Assessment & Plan Note (Signed)
Continue with current prescription therapy as reflected on the Med list. Labs IgG,E

## 2012-05-27 ENCOUNTER — Telehealth: Payer: Self-pay | Admitting: Internal Medicine

## 2012-05-27 MED ORDER — CYANOCOBALAMIN 1000 MCG/ML IJ SOLN: 1000.0000 ug | INTRAMUSCULAR | Status: DC

## 2012-05-27 NOTE — Telephone Encounter (Signed)
Elizabeth Gray, please, inform patient that all labs are normal except for low Vit B12 Pls come for her first vit B12 injection to see me this week. Thx

## 2012-05-28 LAB — ALLERGY PROFILE REGION II-DC, DE, MD, ~~LOC~~, VA
Allergen, D pternoyssinus,d7: <0.1 kU/L
Alternaria Alternata: <0.1 kU/L
Aspergillus fumigatus, IgG: <0.1 kU/L
Bermuda Grass: <0.1 kU/L
Box Elder IgE: <0.1 kU/L
Cat Dander: <0.1 kU/L
Cladosporium Herbarum: <0.1 kU/L
Cockroach: <0.1 kU/L
Common Ragweed: <0.1 kU/L
D. farinae: <0.1 kU/L
Dog Dander: <0.1 kU/L
Elm IgE: <0.1 kU/L
Johnson Grass: <0.1 kU/L
Lamb's Quarters: <0.1 kU/L
Meadow Grass: <0.1 kU/L
Oak: <0.1 kU/L
Pecan/Hickory Tree IgE: <0.1 kU/L

## 2012-05-28 NOTE — Telephone Encounter (Signed)
Pt notified, appt made for 06/01/12.

## 2012-06-01 ENCOUNTER — Ambulatory Visit (INDEPENDENT_AMBULATORY_CARE_PROVIDER_SITE_OTHER): Payer: Medicare Other | Admitting: Internal Medicine

## 2012-06-01 ENCOUNTER — Encounter: Payer: Self-pay | Admitting: Internal Medicine

## 2012-06-01 VITALS — BP 120/62 | HR 70 | Temp 97.0°F | Resp 16 | Wt 163.0 lb

## 2012-06-01 DIAGNOSIS — M1990 Unspecified osteoarthritis, unspecified site: Secondary | ICD-10-CM | POA: Diagnosis not present

## 2012-06-01 DIAGNOSIS — E538 Deficiency of other specified B group vitamins: Secondary | ICD-10-CM

## 2012-06-01 MED ORDER — "SYRINGE/NEEDLE (DISP) 30G X 1/2"" 1 ML MISC": 1.0000 | Status: DC

## 2012-06-01 MED ORDER — CYANOCOBALAMIN 1000 MCG/ML IJ SOLN
1000.0000 ug | Freq: Once | INTRAMUSCULAR | Status: AC
  Administered 2012-06-01: 1000 ug via INTRAMUSCULAR

## 2012-06-01 NOTE — Assessment & Plan Note (Signed)
Continue with current prescription therapy as reflected on the Med list.  

## 2012-06-01 NOTE — Progress Notes (Signed)
   Subjective:    Patient ID: Elizabeth Gray, female    DOB: 1940-08-14, 71 y.o.   MRN: [id number]  HPI The patient is here for a B12 deficiency f/up: she needs to start on injections  Wt Readings from Last 3 Encounters:  06/01/12 163 lb (73.936 kg)  05/25/12 163 lb (73.936 kg)  03/26/12 161 lb 12.8 oz (73.392 kg)   BP Readings from Last 3 Encounters:  06/01/12 120/62  05/25/12 130/70  03/26/12 110/60       Review of Systems  Constitutional: Negative for chills, activity change, appetite change, fatigue and unexpected weight change.  HENT: Positive for rhinorrhea. Negative for congestion, mouth sores and sinus pressure.   Eyes: Negative for visual disturbance.  Respiratory: Negative for cough and chest tightness.   Gastrointestinal: Negative for nausea and abdominal pain.  Genitourinary: Negative for frequency, difficulty urinating and vaginal pain.  Musculoskeletal: Positive for arthralgias. Negative for back pain and gait problem.  Skin: Negative for pallor and rash.  Neurological: Negative for dizziness, tremors, weakness, numbness and headaches.  Psychiatric/Behavioral: Negative for confusion and disturbed wake/sleep cycle.       Objective:   Physical Exam  Constitutional: She appears well-developed. No distress.       obese  HENT:  Head: Normocephalic.  Right Ear: External ear normal.  Left Ear: External ear normal.  Nose: Nose normal.  Mouth/Throat: Oropharynx is clear and moist.  Eyes: Conjunctivae normal are normal. Pupils are equal, round, and reactive to light. Right eye exhibits no discharge. Left eye exhibits no discharge.  Neck: Normal range of motion. Neck supple. No JVD present. No tracheal deviation present. No thyromegaly present.  Cardiovascular: Normal rate, regular rhythm and normal heart sounds.   Pulmonary/Chest: No stridor. No respiratory distress. She has no wheezes.  Abdominal: Soft. Bowel sounds are normal. She exhibits no distension and no  mass. There is no tenderness. There is no rebound and no guarding.  Musculoskeletal: She exhibits tenderness (LS spine). She exhibits no edema.  Lymphadenopathy:    She has no cervical adenopathy.  Neurological: She displays normal reflexes. No cranial nerve deficit. She exhibits normal muscle tone. Coordination normal.  Skin: No rash noted. No erythema.  Psychiatric: She has a normal mood and affect. Her behavior is normal. Judgment and thought content normal.    Lab Results  Component Value Date   WBC 8.3 05/25/2012   HGB 13.7 05/25/2012   HCT 41.5 05/25/2012   PLT 360.0 05/25/2012   GLUCOSE 93 05/25/2012   CHOL 175 05/25/2012   TRIG 82.0 05/25/2012   HDL 65.50 05/25/2012   LDLCALC 93 05/25/2012   ALT 26 05/25/2012   AST 29 05/25/2012   NA 142 05/25/2012   K 3.8 05/25/2012   CL 106 05/25/2012   CREATININE 0.9 05/25/2012   BUN 17 05/25/2012   CO2 27 05/25/2012   TSH 0.82 05/25/2012         Assessment & Plan:

## 2012-06-04 ENCOUNTER — Telehealth: Payer: Self-pay | Admitting: *Deleted

## 2012-06-04 ENCOUNTER — Encounter: Payer: Self-pay | Admitting: Internal Medicine

## 2012-06-05 MED ORDER — "SYRINGE/NEEDLE (DISP) 27G X 1/2"" 1 ML MISC": Status: DC

## 2012-06-05 NOTE — Telephone Encounter (Signed)
Pt request Rx for different syringe be sent to her pharmacy as the 30G 1/2" syringes aren't available.  New Rx sent. Pt informed

## 2012-06-11 DIAGNOSIS — H2510 Age-related nuclear cataract, unspecified eye: Secondary | ICD-10-CM | POA: Diagnosis not present

## 2012-06-11 DIAGNOSIS — H40019 Open angle with borderline findings, low risk, unspecified eye: Secondary | ICD-10-CM | POA: Diagnosis not present

## 2012-06-11 DIAGNOSIS — H04129 Dry eye syndrome of unspecified lacrimal gland: Secondary | ICD-10-CM | POA: Diagnosis not present

## 2012-06-22 LAB — IGG FOOD PANEL
Chicken, IgG: <0.15 ug/mL (ref <0.15)
Corn, IgG: <0.15 ug/mL (ref <0.15)

## 2012-07-02 DIAGNOSIS — Z01419 Encounter for gynecological examination (general) (routine) without abnormal findings: Secondary | ICD-10-CM | POA: Diagnosis not present

## 2012-07-02 DIAGNOSIS — Z0000 Encounter for general adult medical examination without abnormal findings: Secondary | ICD-10-CM | POA: Diagnosis not present

## 2012-07-02 DIAGNOSIS — Z124 Encounter for screening for malignant neoplasm of cervix: Secondary | ICD-10-CM | POA: Diagnosis not present

## 2012-07-06 DIAGNOSIS — M949 Disorder of cartilage, unspecified: Secondary | ICD-10-CM | POA: Diagnosis not present

## 2012-07-06 DIAGNOSIS — M899 Disorder of bone, unspecified: Secondary | ICD-10-CM | POA: Diagnosis not present

## 2012-07-21 ENCOUNTER — Other Ambulatory Visit: Payer: Self-pay | Admitting: Internal Medicine

## 2012-08-02 ENCOUNTER — Encounter: Payer: Self-pay | Admitting: Internal Medicine

## 2012-08-27 ENCOUNTER — Encounter: Payer: Self-pay | Admitting: Critical Care Medicine

## 2012-08-27 ENCOUNTER — Ambulatory Visit (INDEPENDENT_AMBULATORY_CARE_PROVIDER_SITE_OTHER): Payer: Medicare Other | Admitting: Critical Care Medicine

## 2012-08-27 VITALS — BP 140/72 | HR 59 | Temp 98.2°F | Ht 65.0 in | Wt 165.4 lb

## 2012-08-27 DIAGNOSIS — D869 Sarcoidosis, unspecified: Secondary | ICD-10-CM

## 2012-08-27 NOTE — Progress Notes (Signed)
Subjective:    Patient ID: Elizabeth Gray, female    DOB: 1941-01-17, 72 y.o.   MRN: [id number]  HPI  This is a 72 y.o.  white female with stage II sarcoidosis.  tightness.   08/27/2012 Sarcoid f/u. Pt notes some wheezing with exhalation. Notes L nasal bleeding if blows the nose. No nasal saline use. R ear is sore for three days.  No cough.  Dyspnea is stable. No ABX use. Pt denies any significant sore throat, nasal congestion or excess secretions, fever, chills, sweats, unintended weight loss, pleurtic or exertional chest pain, orthopnea PND, or leg swelling Pt denies any increase in rescue therapy over baseline, denies waking up needing it or having any early am or nocturnal exacerbations of coughing/wheezing/or dyspnea. Pt also denies any obvious fluctuation in symptoms with  weather or environmental change or other alleviating or aggravating factors    Past Medical History  Diagnosis Date  . Nonorganic sleep disorder, unspecified   . Fatigue   . Pulmonary sarcoidosis   . Hypothyroidism   . Polio 1948  . Vitamin D deficiency   . Vitamin B12 deficiency   . Osteoarthritis   . GERD (gastroesophageal reflux disease)   . Pancreatitis   . Personal history of colonic polyps   . Iron deficiency anemia, unspecified   . Lower esophageal ring 2008    EGD  . External hemorrhoids without mention of complication 2008    Colonoscopy   . Bursitis      Family History  Problem Relation Age of Onset  . Breast cancer Paternal Aunt   . Brain cancer Father   . Colon cancer Neg Hx      History   Social History  . Marital Status: Married    Spouse Name: N/A    Number of Children: N/A  . Years of Education: N/A   Occupational History  . Psychologist, forensic     works three days per week    Social History Main Topics  . Smoking status: Former Smoker -- 1.0 packs/day for 17 years    Types: Cigarettes    Quit date: 08/01/1990  . Smokeless tobacco: Never Used  . Alcohol Use: No  .  Drug Use: No  . Sexually Active: Not on file   Other Topics Concern  . Not on file   Social History Narrative  . No narrative on file     Allergies  Allergen Reactions  . Metoclopramide Hcl   . Oxymetazoline Hcl   . Sucralfate      Outpatient Prescriptions Prior to Visit  Medication Sig Dispense Refill  . Alum Hydroxide-Mag Carbonate (GAVISCON PO) As directed as needed       . beclomethasone (QVAR) 40 MCG/ACT inhaler Inhale 2 puffs into the lungs 2 (two) times daily.  8.7 g  11  . Carboxymethylcellul-Glycerin (OPTIVE) 0.5-0.9 % SOLN Apply 1 drop to eye daily as needed.        . Cholecalciferol (VITAMIN D PO) Take 1,000 Units by mouth daily.       . cyanocobalamin (COBAL-1000) 1000 MCG/ML injection Inject 1 mL (1,000 mcg total) into the skin every 14 (fourteen) days. Start with daily injections x 7 d, then 1 injection every 2 wks  10 mL  6  . Flaxseed, Linseed, (FLAXSEED OIL) OIL Take 1 capsule by mouth 2 (two) times daily.        . Glucosamine-Chondroit-Vit C-Mn (GLUCOSAMINE CHONDR 1500 COMPLX PO) Take 1 tablet by mouth 2 (two) times daily.        Marland Kitchen  levothyroxine (SYNTHROID, LEVOTHROID) 100 MCG tablet TAKE 1 TABLET EVERY DAY  90 tablet  3  . meclizine (ANTIVERT) 12.5 MG tablet TAKE 1 TO 2 TABLETS 4 TIMES A DAY AS NEEDED DIZZINESS  60 tablet  1  . methocarbamol (ROBAXIN) 500 MG tablet Take 500 mg by mouth at bedtime.       . Multiple Vitamins-Minerals (ICAPS AREDS FORMULA PO) Take 1 capsule by mouth daily.        Marland Kitchen omeprazole-sodium bicarbonate (ZEGERID) 40-1100 MG per capsule ONE BY MOUTH ONCE DAILY  30 capsule  6  . Polyethyl Glycol-Propyl Glycol (SYSTANE) 0.4-0.3 % SOLN 1 to 2 drops four times daily as needed       . Syringe/Needle, Disp, (B-D ECLIPSE SYRINGE) 27G X 1/2" 1 ML MISC 1 daily x 7 days, then once every 14 days.  50 each  0  . topiramate (TOPAMAX) 100 MG tablet Take 100 mg by mouth at bedtime.      . traMADol (ULTRAM) 50 MG tablet Take 2 tablets by mouth at bedtime.        . vitamin C (ASCORBIC ACID) 500 MG tablet Take 500 mg by mouth daily.        Marland Kitchen zolpidem (AMBIEN) 10 MG tablet TAKE 1 TABLET AT BEDTIME AS NEEDED  30 tablet  4  . [DISCONTINUED] CREON 24000 UNITS CPEP TAKE 1 CAPSULE 3 TIMES A DAY BEFORE MEALS **MUST MAKE APPT BEFORE ANY MORE REFILLS**  90 capsule  3  . [DISCONTINUED] Pancrelipase, Lip-Prot-Amyl, (CREON) 24000 UNITS CPEP 1 capsule twice a day      Last reviewed on 08/27/2012  2:08 PM by Storm Frisk, MD    Review of Systems  Constitutional:   No  weight loss, night sweats,  Fevers, chills, fatigue, lassitude. HEENT:   No headaches,  Difficulty swallowing,  Tooth/dental problems,  Sore throat,                No sneezing, itching, ear ache, nasal congestion, post nasal drip,   Mild epistaxsis  CV:  No chest pain,  Orthopnea, PND, swelling in lower extremities, anasarca, dizziness, palpitations  GI  No heartburn, indigestion, abdominal pain, nausea, vomiting, diarrhea, change in bowel habits, loss of appetite  Resp: Notes  shortness of breath with exertion or at rest.  No excess mucus, no productive cough,  No non-productive cough,  No coughing up of blood.  No change in color of mucus.  No wheezing.  No chest wall deformity  Skin: no rash or lesions.  GU: no dysuria, change in color of urine, no urgency or frequency.  No flank pain.  MS:  No joint pain or swelling.  No decreased range of motion.  No back pain.  Psych:  No change in mood or affect. No depression or anxiety.  No memory loss.     Objective:   Physical Exam  Filed Vitals:   08/27/12 1400  BP: 140/72  Pulse: 59  Temp: 98.2 F (36.8 C)  TempSrc: Oral  Height: 5\' 5"  (1.651 m)  Weight: 165 lb 6.4 oz (75.025 kg)  SpO2: 97%    Gen: Pleasant, well-nourished, in no distress,  normal affect  ENT: No lesions,  mouth clear,  oropharynx clear, no postnasal drip, dry mucus membranes  Neck: No JVD, no TMG, no carotid bruits  Lungs: No use of accessory muscles, no  dullness to percussion, clear without rales or rhonchi  Cardiovascular: RRR, heart sounds normal, no murmur or gallops, no peripheral  edema  Abdomen: soft and NT, no HSM,  BS normal  Musculoskeletal: No deformities, no cyanosis or clubbing  Neuro: alert, non focal  Skin: Warm, no lesions or rashes        Assessment & Plan:   SARCOIDOSIS, PULMONARY Stable pulmonary sarcoidosis with lower airway involvement Plan Saline nasal spray for dry nasal mucosa Continue Qvar twice daily 2 inhalations    Updated Medication List Outpatient Encounter Prescriptions as of 08/27/2012  Medication Sig Dispense Refill  . Alum Hydroxide-Mag Carbonate (GAVISCON PO) As directed as needed       . aspirin 81 MG tablet Take 81 mg by mouth daily.      . beclomethasone (QVAR) 40 MCG/ACT inhaler Inhale 2 puffs into the lungs 2 (two) times daily.  8.7 g  11  . Carboxymethylcellul-Glycerin (OPTIVE) 0.5-0.9 % SOLN Apply 1 drop to eye daily as needed.        . Cholecalciferol (VITAMIN D PO) Take 1,000 Units by mouth daily.       . cyanocobalamin (COBAL-1000) 1000 MCG/ML injection Inject 1 mL (1,000 mcg total) into the skin every 14 (fourteen) days. Start with daily injections x 7 d, then 1 injection every 2 wks  10 mL  6  . Flaxseed, Linseed, (FLAXSEED OIL) OIL Take 1 capsule by mouth 2 (two) times daily.        . Glucosamine-Chondroit-Vit C-Mn (GLUCOSAMINE CHONDR 1500 COMPLX PO) Take 1 tablet by mouth 2 (two) times daily.        Marland Kitchen levothyroxine (SYNTHROID, LEVOTHROID) 100 MCG tablet TAKE 1 TABLET EVERY DAY  90 tablet  3  . meclizine (ANTIVERT) 12.5 MG tablet TAKE 1 TO 2 TABLETS 4 TIMES A DAY AS NEEDED DIZZINESS  60 tablet  1  . methocarbamol (ROBAXIN) 500 MG tablet Take 500 mg by mouth at bedtime.       . Multiple Vitamins-Minerals (ICAPS AREDS FORMULA PO) Take 1 capsule by mouth daily.        Marland Kitchen omeprazole-sodium bicarbonate (ZEGERID) 40-1100 MG per capsule ONE BY MOUTH ONCE DAILY  30 capsule  6  .  Pancrelipase, Lip-Prot-Amyl, (CREON) 24000 UNITS CPEP       . Polyethyl Glycol-Propyl Glycol (SYSTANE) 0.4-0.3 % SOLN 1 to 2 drops four times daily as needed       . Syringe/Needle, Disp, (B-D ECLIPSE SYRINGE) 27G X 1/2" 1 ML MISC 1 daily x 7 days, then once every 14 days.  50 each  0  . topiramate (TOPAMAX) 100 MG tablet Take 100 mg by mouth at bedtime.      . traMADol (ULTRAM) 50 MG tablet Take 2 tablets by mouth at bedtime.       . vitamin C (ASCORBIC ACID) 500 MG tablet Take 500 mg by mouth daily.        Marland Kitchen zolpidem (AMBIEN) 10 MG tablet TAKE 1 TABLET AT BEDTIME AS NEEDED  30 tablet  4  . [DISCONTINUED] CREON 24000 UNITS CPEP TAKE 1 CAPSULE 3 TIMES A DAY BEFORE MEALS **MUST MAKE APPT BEFORE ANY MORE REFILLS**  90 capsule  3  . [DISCONTINUED] Pancrelipase, Lip-Prot-Amyl, (CREON) 24000 UNITS CPEP 1 capsule twice a day

## 2012-08-27 NOTE — Assessment & Plan Note (Signed)
Stable pulmonary sarcoidosis with lower airway involvement Plan Saline nasal spray for dry nasal mucosa Continue Qvar twice daily 2 inhalations

## 2012-08-27 NOTE — Patient Instructions (Signed)
No change in medications. Return in        6 months        

## 2012-08-31 ENCOUNTER — Ambulatory Visit (INDEPENDENT_AMBULATORY_CARE_PROVIDER_SITE_OTHER): Payer: Medicare Other | Admitting: Internal Medicine

## 2012-08-31 ENCOUNTER — Other Ambulatory Visit (INDEPENDENT_AMBULATORY_CARE_PROVIDER_SITE_OTHER): Payer: Medicare Other

## 2012-08-31 ENCOUNTER — Encounter: Payer: Self-pay | Admitting: Internal Medicine

## 2012-08-31 VITALS — BP 140/72 | HR 80 | Temp 97.9°F | Resp 16 | Wt 163.0 lb

## 2012-08-31 DIAGNOSIS — E039 Hypothyroidism, unspecified: Secondary | ICD-10-CM

## 2012-08-31 DIAGNOSIS — E538 Deficiency of other specified B group vitamins: Secondary | ICD-10-CM

## 2012-08-31 DIAGNOSIS — L258 Unspecified contact dermatitis due to other agents: Secondary | ICD-10-CM | POA: Diagnosis not present

## 2012-08-31 DIAGNOSIS — L853 Xerosis cutis: Secondary | ICD-10-CM

## 2012-08-31 DIAGNOSIS — M25559 Pain in unspecified hip: Secondary | ICD-10-CM | POA: Diagnosis not present

## 2012-08-31 DIAGNOSIS — H6120 Impacted cerumen, unspecified ear: Secondary | ICD-10-CM | POA: Diagnosis not present

## 2012-08-31 DIAGNOSIS — M25552 Pain in left hip: Secondary | ICD-10-CM

## 2012-08-31 LAB — CBC WITH DIFFERENTIAL/PLATELET
Basophils Relative: 0.7 % (ref 0.0–3.0)
Eosinophils Absolute: 0.3 10*3/uL (ref 0.0–0.7)
Hemoglobin: 13.6 g/dL (ref 12.0–15.0)
Lymphs Abs: 2.3 10*3/uL (ref 0.7–4.0)
MCHC: 33.9 g/dL (ref 30.0–36.0)
MCV: 85.9 fl (ref 78.0–100.0)
Monocytes Absolute: 0.6 10*3/uL (ref 0.1–1.0)
Neutro Abs: 4.7 10*3/uL (ref 1.4–7.7)
RBC: 4.69 Mil/uL (ref 3.87–5.11)
RDW: 13.4 % (ref 11.5–14.6)

## 2012-08-31 MED ORDER — TRIAMCINOLONE ACETONIDE 0.5 % EX OINT: TOPICAL_OINTMENT | Freq: Two times a day (BID) | CUTANEOUS | Status: DC

## 2012-08-31 NOTE — Assessment & Plan Note (Signed)
Continue with current prescription therapy as reflected on the Med list.  

## 2012-08-31 NOTE — Assessment & Plan Note (Signed)
Triamc oint 0.5% bid prn

## 2012-08-31 NOTE — Assessment & Plan Note (Signed)
1/14 fibrous lumps over troch major Ortho consult if worse

## 2012-08-31 NOTE — Assessment & Plan Note (Signed)
See procedure 

## 2012-08-31 NOTE — Progress Notes (Signed)
Subjective:    HPI  The patient is here for a B12 deficiency f/up: she needs to start on injections She is upet: needs to but her cat down  Wt Readings from Last 3 Encounters:  08/31/12 163 lb (73.936 kg)  08/27/12 165 lb 6.4 oz (75.025 kg)  06/01/12 163 lb (73.936 kg)   BP Readings from Last 3 Encounters:  08/31/12 140/72  08/27/12 140/72  06/01/12 120/62       Review of Systems  Constitutional: Negative for chills, activity change, appetite change, fatigue and unexpected weight change.  HENT: Positive for rhinorrhea. Negative for congestion, mouth sores and sinus pressure.   Eyes: Negative for visual disturbance.  Respiratory: Negative for cough and chest tightness.   Gastrointestinal: Negative for nausea and abdominal pain.  Genitourinary: Negative for frequency, difficulty urinating and vaginal pain.  Musculoskeletal: Positive for arthralgias. Negative for back pain and gait problem.  Skin: Negative for pallor and rash.  Neurological: Negative for dizziness, tremors, weakness, numbness and headaches.  Psychiatric/Behavioral: Negative for confusion and sleep disturbance.       Objective:   Physical Exam  Constitutional: She appears well-developed. No distress.       obese  HENT:  Head: Normocephalic.  Right Ear: External ear normal.  Left Ear: External ear normal.  Nose: Nose normal.  Mouth/Throat: Oropharynx is clear and moist.  Eyes: Conjunctivae normal are normal. Pupils are equal, round, and reactive to light. Right eye exhibits no discharge. Left eye exhibits no discharge.  Neck: Normal range of motion. Neck supple. No JVD present. No tracheal deviation present. No thyromegaly present.  Cardiovascular: Normal rate, regular rhythm and normal heart sounds.   Pulmonary/Chest: No stridor. No respiratory distress. She has no wheezes.  Abdominal: Soft. Bowel sounds are normal. She exhibits no distension and no mass. There is no tenderness. There is no rebound  and no guarding.  Musculoskeletal: She exhibits tenderness (LS spine). She exhibits no edema.  Lymphadenopathy:    She has no cervical adenopathy.  Neurological: She displays normal reflexes. No cranial nerve deficit. She exhibits normal muscle tone. Coordination normal.  Skin: No rash noted. No erythema.  Psychiatric: She has a normal mood and affect. Her behavior is normal. Judgment and thought content normal.    Lab Results  Component Value Date   WBC 8.3 05/25/2012   HGB 13.7 05/25/2012   HCT 41.5 05/25/2012   PLT 360.0 05/25/2012   GLUCOSE 93 05/25/2012   CHOL 175 05/25/2012   TRIG 82.0 05/25/2012   HDL 65.50 05/25/2012   LDLCALC 93 05/25/2012   ALT 26 05/25/2012   AST 29 05/25/2012   NA 142 05/25/2012   K 3.8 05/25/2012   CL 106 05/25/2012   CREATININE 0.9 05/25/2012   BUN 17 05/25/2012   CO2 27 05/25/2012   TSH 0.82 05/25/2012    Procedure Note :     Procedure :  Ear irrigation   Indication:  Cerumen impaction L   Risks, including pain, dizziness, eardrum perforation, bleeding, infection and others as well as benefits were explained to the patient in detail. Verbal consent was obtained and the patient agreed to proceed.    We used "The Elephant Ear Irrigation Device" filled with lukewarm water for irrigation. A large amount wax was recovered. Procedure has also required manual wax removal with an ear loop.   Tolerated well. Complications: None.   Postprocedure instructions :  Call if problems.        Assessment & Plan:

## 2012-09-03 ENCOUNTER — Ambulatory Visit (AMBULATORY_SURGERY_CENTER): Payer: Medicare Other | Admitting: *Deleted

## 2012-09-03 VITALS — Ht 65.0 in | Wt 165.2 lb

## 2012-09-03 DIAGNOSIS — Z860100 Personal history of colon polyps, unspecified: Secondary | ICD-10-CM

## 2012-09-03 DIAGNOSIS — Z1211 Encounter for screening for malignant neoplasm of colon: Secondary | ICD-10-CM

## 2012-09-03 MED ORDER — NA SULFATE-K SULFATE-MG SULF 17.5-3.13-1.6 GM/177ML PO SOLN: ORAL | Status: DC

## 2012-09-07 ENCOUNTER — Telehealth: Payer: Self-pay | Admitting: Internal Medicine

## 2012-09-07 DIAGNOSIS — R51 Headache: Secondary | ICD-10-CM | POA: Diagnosis not present

## 2012-09-07 NOTE — Telephone Encounter (Signed)
The patient has been prescribed Prednisone by another physician.  She would like to know if this will interfere with her colonoscopy on the 17th.

## 2012-09-10 ENCOUNTER — Encounter: Payer: Self-pay | Admitting: Internal Medicine

## 2012-09-10 NOTE — Telephone Encounter (Signed)
Left message on cell phone.  Ok to be on Prednisone prior to colon on the 17th of February.  If any other concerns please cal [phone]

## 2012-09-14 ENCOUNTER — Other Ambulatory Visit: Payer: Self-pay | Admitting: Internal Medicine

## 2012-09-17 ENCOUNTER — Encounter: Payer: Self-pay | Admitting: Internal Medicine

## 2012-09-17 ENCOUNTER — Ambulatory Visit (AMBULATORY_SURGERY_CENTER): Payer: Medicare Other | Admitting: Internal Medicine

## 2012-09-17 VITALS — BP 125/66 | HR 57 | Temp 97.3°F | Resp 51 | Ht 65.0 in | Wt 165.0 lb

## 2012-09-17 DIAGNOSIS — D869 Sarcoidosis, unspecified: Secondary | ICD-10-CM | POA: Diagnosis not present

## 2012-09-17 DIAGNOSIS — E039 Hypothyroidism, unspecified: Secondary | ICD-10-CM | POA: Diagnosis not present

## 2012-09-17 DIAGNOSIS — D126 Benign neoplasm of colon, unspecified: Secondary | ICD-10-CM

## 2012-09-17 DIAGNOSIS — Z1211 Encounter for screening for malignant neoplasm of colon: Secondary | ICD-10-CM | POA: Diagnosis not present

## 2012-09-17 DIAGNOSIS — Z86010 Personal history of colonic polyps: Secondary | ICD-10-CM | POA: Diagnosis not present

## 2012-09-17 DIAGNOSIS — K649 Unspecified hemorrhoids: Secondary | ICD-10-CM

## 2012-09-17 MED ORDER — SODIUM CHLORIDE 0.9 % IV SOLN: 500.0000 mL | INTRAVENOUS | Status: DC

## 2012-09-17 NOTE — Patient Instructions (Addendum)
I found and removed one polyp. You have hemorrhoids also. Otherwise ok with great prep.  I will let you know pathology results and when to have another routine colonoscopy by mail.  Thank you for choosing me and [facility].  Iva Boop, MD, FACG  YOU HAD AN ENDOSCOPIC PROCEDURE TODAY AT THE [facility]: Refer to the procedure report that was given to you for any specific questions about what was found during the examination.  If the procedure report does not answer your questions, please call your gastroenterologist to clarify.  If you requested that your care partner not be given the details of your procedure findings, then the procedure report has been included in a sealed envelope for you to review at your convenience later.  YOU SHOULD EXPECT: Some feelings of bloating in the abdomen. Passage of more gas than usual.  Walking can help get rid of the air that was put into your GI tract during the procedure and reduce the bloating. If you had a lower endoscopy (such as a colonoscopy or flexible sigmoidoscopy) you may notice spotting of blood in your stool or on the toilet paper. If you underwent a bowel prep for your procedure, then you may not have a normal bowel movement for a few days.  DIET: Your first meal following the procedure should be a light meal and then it is ok to progress to your normal diet.  A half-sandwich or bowl of soup is an example of a good first meal.  Heavy or fried foods are harder to digest and may make you feel nauseous or bloated.  Likewise meals heavy in dairy and vegetables can cause extra gas to form and this can also increase the bloating.  Drink plenty of fluids but you should avoid alcoholic beverages for 24 hours.  ACTIVITY: Your care partner should take you home directly after the procedure.  You should plan to take it easy, moving slowly for the rest of the day.  You can resume normal activity the day after the procedure however  you should NOT DRIVE or use heavy machinery for 24 hours (because of the sedation medicines used during the test).    SYMPTOMS TO REPORT IMMEDIATELY: A gastroenterologist can be reached at any hour.  During normal business hours, 8:30 AM to 5:00 PM Monday through Friday, call [phone].  After hours and on weekends, please call the GI answering service at [phone] who will take a message and have the physician on call contact you.   Following lower endoscopy (colonoscopy or flexible sigmoidoscopy):  Excessive amounts of blood in the stool  Significant tenderness or worsening of abdominal pains  Swelling of the abdomen that is new, acute  Fever of 100F or higher  FOLLOW UP: If any biopsies were taken you will be contacted by phone or by letter within the next 1-3 weeks.  Call your gastroenterologist if you have not heard about the biopsies in 3 weeks.  Our staff will call the home number listed on your records the next business day following your procedure to check on you and address any questions or concerns that you may have at that time regarding the information given to you following your procedure. This is a courtesy call and so if there is no answer at the home number and we have not heard from you through the emergency physician on call, we will assume that you have returned to your regular daily activities without incident.  SIGNATURES/CONFIDENTIALITY:  You and/or your care partner have signed paperwork which will be entered into your electronic medical record.  These signatures attest to the fact that that the information above on your After Visit Summary has been reviewed and is understood.  Full responsibility of the confidentiality of this discharge information lies with you and/or your care-partner.

## 2012-09-17 NOTE — Progress Notes (Signed)
Called to room to assist during endoscopic procedure.  Patient ID and intended procedure confirmed with present staff. Received instructions for my participation in the procedure from the performing physician.  

## 2012-09-17 NOTE — Op Note (Signed)
[facility] [address] [place], [address]   COLONOSCOPY PROCEDURE REPORT  PATIENT: Elizabeth Gray, Elizabeth Gray  MR#: [id number] BIRTHDATE: October 27, 1940 , 71  yrs. old GENDER: Female ENDOSCOPIST: Iva Boop, MD, [facility] PROCEDURE DATE:  09/17/2012 PROCEDURE:   Colonoscopy with snare polypectomy ASA CLASS:   Class II INDICATIONS:Screening and surveillance,personal history of colonic polyps. MEDICATIONS: Propofol (Diprivan) 140 mg IV, MAC sedation, administered by CRNA, and These medications were titrated to patient response per physician's verbal order  DESCRIPTION OF PROCEDURE:   After the risks benefits and alternatives of the procedure were thoroughly explained, informed consent was obtained.  A digital rectal exam revealed external hemorrhoids.   The LB [id number]  endoscope was introduced through the anus and advanced to the cecum, which was identified by both the appendix and ileocecal valve. No adverse events experienced.   The quality of the prep was Suprep excellent  The instrument was then slowly withdrawn as the colon was fully examined.      COLON FINDINGS: A polypoid shaped and smooth sessile polyp measuring 8 mm in size was found in the transverse colon.  A polypectomy was performed with a cold snare.  The resection was complete and the polyp tissue was completely retrieved.   Moderate sized internal and external hemorrhoids were found.   The colon mucosa was otherwise normal.  Retroflexed views revealed internal/external hemorrhoids. The time to cecum=4 minutes 43 seconds.  Withdrawal time=8 minutes 0 seconds.  The scope was withdrawn and the procedure completed. COMPLICATIONS: There were no complications.  ENDOSCOPIC IMPRESSION: 1.   Sessile polyp measuring 8 mm in size was found in the transverse colon; polypectomy was performed with a cold snare 2.   Moderate sized internal and external hemorrhoids 3.   The colon mucosa was otherwise  normal - excellent prep  RECOMMENDATIONS: Timing of repeat colonoscopy will be determined by pathology findings.   eSigned:  Iva Boop, MD, [facility] 09/17/2012 10:39 AM   cc: The Patient

## 2012-09-17 NOTE — Progress Notes (Addendum)
Patient did not have preoperative order for IV antibiotic SSI prophylaxis. (G8918)  Patient did not experience any of the following events: a burn prior to discharge; a fall within the facility; wrong site/side/patient/procedure/implant event; or a hospital transfer or hospital admission upon discharge from the facility. (G8907)  

## 2012-09-18 ENCOUNTER — Telehealth: Payer: Self-pay

## 2012-09-18 NOTE — Telephone Encounter (Signed)
  Follow up Call-  Call back number 09/17/2012  Post procedure Call Back phone  # [phone]  Permission to leave phone message Yes     Patient questions:  Do you have a fever, pain , or abdominal swelling? no Pain Score  0 *  Have you tolerated food without any problems? yes  Have you been able to return to your normal activities? yes  Do you have any questions about your discharge instructions: Diet   no Medications  no Follow up visit  no  Do you have questions or concerns about your Care? no      Actions: * If pain score is 4 or above:   No problems per the pt. Maw   No action needed, pain <4.  No problems per the pt. Maw

## 2012-09-25 ENCOUNTER — Encounter: Payer: Self-pay | Admitting: Internal Medicine

## 2012-09-25 NOTE — Progress Notes (Signed)
Quick Note:  8 mm tubular adenoma Repeat colonoscopy 09/2017 approx ______

## 2012-11-12 ENCOUNTER — Telehealth: Payer: Self-pay | Admitting: Internal Medicine

## 2012-11-12 NOTE — Telephone Encounter (Signed)
Pt states she has bronchitis and wants antibiotic called into [facility] in [place]

## 2012-11-13 MED ORDER — AZITHROMYCIN 250 MG PO TABS: ORAL_TABLET | ORAL | Status: DC

## 2012-11-13 NOTE — Telephone Encounter (Signed)
Ok Zpac OV if not better Thx 

## 2012-11-15 NOTE — Telephone Encounter (Signed)
Pt informed

## 2012-11-18 ENCOUNTER — Other Ambulatory Visit: Payer: Self-pay | Admitting: Internal Medicine

## 2012-11-23 ENCOUNTER — Ambulatory Visit (INDEPENDENT_AMBULATORY_CARE_PROVIDER_SITE_OTHER): Payer: Medicare Other | Admitting: Internal Medicine

## 2012-11-23 ENCOUNTER — Encounter: Payer: Self-pay | Admitting: Internal Medicine

## 2012-11-23 ENCOUNTER — Other Ambulatory Visit (INDEPENDENT_AMBULATORY_CARE_PROVIDER_SITE_OTHER): Payer: Medicare Other

## 2012-11-23 VITALS — BP 130/80 | HR 80 | Temp 98.0°F | Resp 16 | Wt 164.0 lb

## 2012-11-23 DIAGNOSIS — K219 Gastro-esophageal reflux disease without esophagitis: Secondary | ICD-10-CM

## 2012-11-23 DIAGNOSIS — L853 Xerosis cutis: Secondary | ICD-10-CM

## 2012-11-23 DIAGNOSIS — L258 Unspecified contact dermatitis due to other agents: Secondary | ICD-10-CM

## 2012-11-23 DIAGNOSIS — Z52008 Unspecified donor, other blood: Secondary | ICD-10-CM

## 2012-11-23 DIAGNOSIS — E559 Vitamin D deficiency, unspecified: Secondary | ICD-10-CM | POA: Diagnosis not present

## 2012-11-23 DIAGNOSIS — E039 Hypothyroidism, unspecified: Secondary | ICD-10-CM

## 2012-11-23 DIAGNOSIS — E538 Deficiency of other specified B group vitamins: Secondary | ICD-10-CM

## 2012-11-23 LAB — BASIC METABOLIC PANEL
Calcium: 9.1 mg/dL (ref 8.4–10.5)
Creatinine, Ser: 0.8 mg/dL (ref 0.4–1.2)
GFR: 75.61 mL/min (ref >60.00)

## 2012-11-23 LAB — CBC WITH DIFFERENTIAL/PLATELET
Basophils Relative: 0.8 % (ref 0.0–3.0)
Eosinophils Absolute: 0.3 10*3/uL (ref 0.0–0.7)
Eosinophils Relative: 2.6 % (ref 0.0–5.0)
Lymphocytes Relative: 27.6 % (ref 12.0–46.0)
Monocytes Relative: 9 % (ref 3.0–12.0)
Neutrophils Relative %: 60 % (ref 43.0–77.0)
RBC: 4.34 Mil/uL (ref 3.87–5.11)
WBC: 9.5 10*3/uL (ref 4.5–10.5)

## 2012-11-23 LAB — TSH: TSH: 0.7 u[IU]/mL (ref 0.35–5.50)

## 2012-11-23 MED ORDER — LEVOTHYROXINE SODIUM 100 MCG PO TABS: 100.0000 ug | ORAL_TABLET | Freq: Every day | ORAL | Status: DC

## 2012-11-23 NOTE — Progress Notes (Signed)
Patient ID: Elizabeth Gray, female   DOB: Jul 03, 1941, 72 y.o.   MRN: [id number]   Subjective:    HPI  The patient is here for a B12 deficiency f/up: she needs to start on injections She is upet: needs to but her cat down  Wt Readings from Last 3 Encounters:  11/23/12 164 lb (74.39 kg)  09/17/12 165 lb (74.844 kg)  09/03/12 165 lb 3.2 oz (74.934 kg)   BP Readings from Last 3 Encounters:  11/23/12 130/80  09/17/12 125/66  08/31/12 140/72       Review of Systems  Constitutional: Negative for chills, activity change, appetite change, fatigue and unexpected weight change.  HENT: Positive for rhinorrhea. Negative for congestion, mouth sores and sinus pressure.   Eyes: Negative for visual disturbance.  Respiratory: Negative for cough and chest tightness.   Gastrointestinal: Negative for nausea and abdominal pain.  Genitourinary: Negative for frequency, difficulty urinating and vaginal pain.  Musculoskeletal: Positive for arthralgias. Negative for back pain and gait problem.  Skin: Negative for pallor and rash.  Neurological: Negative for dizziness, tremors, weakness, numbness and headaches.  Psychiatric/Behavioral: Negative for confusion and sleep disturbance.       Objective:   Physical Exam  Constitutional: She appears well-developed. No distress.  obese  HENT:  Head: Normocephalic.  Right Ear: External ear normal.  Left Ear: External ear normal.  Nose: Nose normal.  Mouth/Throat: Oropharynx is clear and moist.  Eyes: Conjunctivae are normal. Pupils are equal, round, and reactive to light. Right eye exhibits no discharge. Left eye exhibits no discharge.  Neck: Normal range of motion. Neck supple. No JVD present. No tracheal deviation present. No thyromegaly present.  Cardiovascular: Normal rate, regular rhythm and normal heart sounds.   Pulmonary/Chest: No stridor. No respiratory distress. She has no wheezes.  Abdominal: Soft. Bowel sounds are normal. She exhibits no  distension and no mass. There is no tenderness. There is no rebound and no guarding.  Musculoskeletal: She exhibits tenderness (LS spine). She exhibits no edema.  Lymphadenopathy:    She has no cervical adenopathy.  Neurological: She displays normal reflexes. No cranial nerve deficit. She exhibits normal muscle tone. Coordination normal.  Skin: No rash noted. No erythema.  Psychiatric: She has a normal mood and affect. Her behavior is normal. Judgment and thought content normal.    Lab Results  Component Value Date   WBC 8.0 08/31/2012   HGB 13.6 08/31/2012   HCT 40.3 08/31/2012   PLT 361.0 08/31/2012   GLUCOSE 93 05/25/2012   CHOL 175 05/25/2012   TRIG 82.0 05/25/2012   HDL 65.50 05/25/2012   LDLCALC 93 05/25/2012   ALT 26 05/25/2012   AST 29 05/25/2012   NA 142 05/25/2012   K 3.8 05/25/2012   CL 106 05/25/2012   CREATININE 0.9 05/25/2012   BUN 17 05/25/2012   CO2 27 05/25/2012   TSH 0.82 05/25/2012        Assessment & Plan:

## 2012-11-23 NOTE — Assessment & Plan Note (Signed)
Continue with current prescription therapy as reflected on the Med list. Labs  

## 2012-11-23 NOTE — Assessment & Plan Note (Signed)
Continue with current prescription therapy as reflected on the Med list.  

## 2012-11-23 NOTE — Assessment & Plan Note (Signed)
Better  

## 2012-12-09 ENCOUNTER — Other Ambulatory Visit: Payer: Self-pay | Admitting: Internal Medicine

## 2012-12-25 ENCOUNTER — Other Ambulatory Visit: Payer: Self-pay | Admitting: Internal Medicine

## 2013-02-25 ENCOUNTER — Ambulatory Visit (INDEPENDENT_AMBULATORY_CARE_PROVIDER_SITE_OTHER): Payer: Medicare Other | Admitting: Critical Care Medicine

## 2013-02-25 ENCOUNTER — Encounter: Payer: Self-pay | Admitting: Critical Care Medicine

## 2013-02-25 VITALS — BP 124/60 | HR 62 | Temp 97.2°F | Ht 63.5 in | Wt 164.8 lb

## 2013-02-25 DIAGNOSIS — D869 Sarcoidosis, unspecified: Secondary | ICD-10-CM

## 2013-02-25 MED ORDER — BECLOMETHASONE DIPROPIONATE 40 MCG/ACT IN AERS: 2.0000 | INHALATION_SPRAY | Freq: Two times a day (BID) | RESPIRATORY_TRACT | Status: DC

## 2013-02-25 NOTE — Progress Notes (Signed)
Subjective:    Patient ID: Elizabeth Gray, female    DOB: July 21, 1941, 72 y.o.   MRN: [id number]  HPI  This is a 72 y.o.  white female with stage II sarcoidosis.  tightness.    02/25/2013 Chief Complaint  Patient presents with  . 6 month follow up    Breathing doing well overall.  No SOB, wheezing, chest tightness, chest pain, or cough at this time.  Pt denies any new problems Pt denies any significant sore throat, nasal congestion or excess secretions, fever, chills, sweats, unintended weight loss, pleurtic or exertional chest pain, orthopnea PND, or leg swelling Pt denies any increase in rescue therapy over baseline, denies waking up needing it or having any early am or nocturnal exacerbations of coughing/wheezing/or dyspnea. Pt also denies any obvious fluctuation in symptoms with  weather or environmental change or other alleviating or aggravating factors   No new issues predominantly REM related obstructive sleep apnea , correctd by CPAP 12 cm.   Past Medical History  Diagnosis Date  . Nonorganic sleep disorder, unspecified   . Fatigue   . Pulmonary sarcoidosis   . Hypothyroidism   . Polio 1948  . Vitamin D deficiency   . Vitamin B12 deficiency   . Osteoarthritis   . GERD (gastroesophageal reflux disease)   . Pancreatitis   . Personal history of colonic polyps   . Iron deficiency anemia, unspecified   . Lower esophageal ring 2008    EGD  . External hemorrhoids without mention of complication 2008    Colonoscopy   . Bursitis      Family History  Problem Relation Age of Onset  . Breast cancer Paternal Aunt   . Brain cancer Father   . Colon cancer Neg Hx      History   Social History  . Marital Status: Married    Spouse Name: N/A    Number of Children: N/A  . Years of Education: N/A   Occupational History  . Psychologist, forensic     works three days per week    Social History Main Topics  . Smoking status: Former Smoker -- 1.00 packs/day for 17 years   Types: Cigarettes    Quit date: 08/01/1990  . Smokeless tobacco: Never Used  . Alcohol Use: No  . Drug Use: No  . Sexually Active: Not on file   Other Topics Concern  . Not on file   Social History Narrative  . No narrative on file     Allergies  Allergen Reactions  . Metoclopramide Hcl Other (See Comments)    REGLAN="burning in mouth"  . Oxymetazoline Hcl Itching    AFRIN SPRAY  . Sucralfate Other (See Comments)    CARAFATE=Mouth sores     Outpatient Prescriptions Prior to Visit  Medication Sig Dispense Refill  . Alum Hydroxide-Mag Carbonate (GAVISCON PO) As directed as needed       . aspirin 81 MG tablet Take 81 mg by mouth daily.      . Carboxymethylcellul-Glycerin (OPTIVE) 0.5-0.9 % SOLN Apply 1 drop to eye daily as needed.        . Cholecalciferol (VITAMIN D PO) Take 1,000 Units by mouth daily.       . cyanocobalamin (COBAL-1000) 1000 MCG/ML injection Inject 1 mL (1,000 mcg total) into the skin every 14 (fourteen) days. Start with daily injections x 7 d, then 1 injection every 2 wks  10 mL  6  . Flaxseed, Linseed, (FLAXSEED OIL) OIL Take 1 capsule  by mouth 2 (two) times daily.        . Glucosamine-Chondroit-Vit C-Mn (GLUCOSAMINE CHONDR 1500 COMPLX PO) Take 1 tablet by mouth 2 (two) times daily.        Marland Kitchen levothyroxine (SYNTHROID, LEVOTHROID) 100 MCG tablet TAKE 1 TABLET EVERY DAY  90 tablet  3  . meclizine (ANTIVERT) 12.5 MG tablet TAKE 1 TO 2 TABLETS 4 TIMES A DAY AS NEEDED DIZZINESS  60 tablet  1  . methocarbamol (ROBAXIN) 500 MG tablet Take 500 mg by mouth at bedtime as needed.       . Multiple Vitamins-Minerals (ICAPS AREDS FORMULA PO) Take 1 capsule by mouth daily.        Marland Kitchen omeprazole-sodium bicarbonate (ZEGERID) 40-1100 MG per capsule ONE BY MOUTH ONCE DAILY  30 capsule  6  . Pancrelipase, Lip-Prot-Amyl, (CREON) 24000 UNITS CPEP Take 1 capsule 3 times a day before meals.  90 capsule  6  . Polyethyl Glycol-Propyl Glycol (SYSTANE) 0.4-0.3 % SOLN 1 to 2 drops four times  daily as needed       . Syringe/Needle, Disp, (B-D ECLIPSE SYRINGE) 27G X 1/2" 1 ML MISC 1 daily x 7 days, then once every 14 days.  50 each  0  . topiramate (TOPAMAX) 100 MG tablet Take 100 mg by mouth 2 (two) times daily.       . traMADol (ULTRAM) 50 MG tablet Take 2 tablets by mouth at bedtime.       . vitamin C (ASCORBIC ACID) 500 MG tablet Take 500 mg by mouth daily.        . beclomethasone (QVAR) 40 MCG/ACT inhaler Inhale 2 puffs into the lungs 2 (two) times daily.  8.7 g  11  . levothyroxine (SYNTHROID, LEVOTHROID) 100 MCG tablet Take 1 tablet (100 mcg total) by mouth daily before breakfast.  90 tablet  3  . zolpidem (AMBIEN) 10 MG tablet TAKE 1 TABLET BY MOUTH AT BEDTIME AS NEEDED  30 tablet  5  . triamcinolone ointment (KENALOG) 0.5 % Apply topically 2 (two) times daily.  30 g  0   No facility-administered medications prior to visit.      Review of Systems  Constitutional:   No  weight loss, night sweats,  Fevers, chills, fatigue, lassitude. HEENT:   No headaches,  Difficulty swallowing,  Tooth/dental problems,  Sore throat,                No sneezing, itching, ear ache, nasal congestion, post nasal drip,   Mild epistaxsis  CV:  No chest pain,  Orthopnea, PND, swelling in lower extremities, anasarca, dizziness, palpitations  GI  No heartburn, indigestion, abdominal pain, nausea, vomiting, diarrhea, change in bowel habits, loss of appetite  Resp: No  shortness of breath with exertion or at rest.  No excess mucus, no productive cough,  No non-productive cough,  No coughing up of blood.  No change in color of mucus.  No wheezing.  No chest wall deformity  Skin: no rash or lesions.  GU: no dysuria, change in color of urine, no urgency or frequency.  No flank pain.  MS:  No joint pain or swelling.  No decreased range of motion.  No back pain.  Psych:  No change in mood or affect. No depression or anxiety.  No memory loss.     Objective:   Physical Exam  Filed Vitals:    02/25/13 1343  BP: 124/60  Pulse: 62  Temp: 97.2 F (36.2 C)  TempSrc: Oral  Height: 5' 3.5" (1.613 m)  Weight: 164 lb 12.8 oz (74.753 kg)  SpO2: 96%    Gen: Pleasant, well-nourished, in no distress,  normal affect  ENT: No lesions,  mouth clear,  oropharynx clear, no postnasal drip, dry mucus membranes  Neck: No JVD, no TMG, no carotid bruits  Lungs: No use of accessory muscles, no dullness to percussion, clear without rales or rhonchi  Cardiovascular: RRR, heart sounds normal, no murmur or gallops, no peripheral edema  Abdomen: soft and NT, no HSM,  BS normal  Musculoskeletal: No deformities, no cyanosis or clubbing  Neuro: alert, non focal  Skin: Warm, no lesions or rashes        Assessment & Plan:   SARCOIDOSIS, PULMONARY Pulmonary sarcoidosis with stable airway function Plan Continue Qvar two puff twice daily Return 6 months    Updated Medication List Outpatient Encounter Prescriptions as of 02/25/2013  Medication Sig Dispense Refill  . Alum Hydroxide-Mag Carbonate (GAVISCON PO) As directed as needed       . aspirin 81 MG tablet Take 81 mg by mouth daily.      . beclomethasone (QVAR) 40 MCG/ACT inhaler Inhale 2 puffs into the lungs 2 (two) times daily.  8.7 g  11  . Carboxymethylcellul-Glycerin (OPTIVE) 0.5-0.9 % SOLN Apply 1 drop to eye daily as needed.        . Cholecalciferol (VITAMIN D PO) Take 1,000 Units by mouth daily.       . cyanocobalamin (COBAL-1000) 1000 MCG/ML injection Inject 1 mL (1,000 mcg total) into the skin every 14 (fourteen) days. Start with daily injections x 7 d, then 1 injection every 2 wks  10 mL  6  . Flaxseed, Linseed, (FLAXSEED OIL) OIL Take 1 capsule by mouth 2 (two) times daily.        . Glucosamine-Chondroit-Vit C-Mn (GLUCOSAMINE CHONDR 1500 COMPLX PO) Take 1 tablet by mouth 2 (two) times daily.        Marland Kitchen levothyroxine (SYNTHROID, LEVOTHROID) 100 MCG tablet TAKE 1 TABLET EVERY DAY  90 tablet  3  . meclizine (ANTIVERT) 12.5 MG  tablet TAKE 1 TO 2 TABLETS 4 TIMES A DAY AS NEEDED DIZZINESS  60 tablet  1  . methocarbamol (ROBAXIN) 500 MG tablet Take 500 mg by mouth at bedtime as needed.       . Multiple Vitamins-Minerals (ICAPS AREDS FORMULA PO) Take 1 capsule by mouth daily.        Marland Kitchen omeprazole-sodium bicarbonate (ZEGERID) 40-1100 MG per capsule ONE BY MOUTH ONCE DAILY  30 capsule  6  . Pancrelipase, Lip-Prot-Amyl, (CREON) 24000 UNITS CPEP Take 1 capsule 3 times a day before meals.  90 capsule  6  . Polyethyl Glycol-Propyl Glycol (SYSTANE) 0.4-0.3 % SOLN 1 to 2 drops four times daily as needed       . Syringe/Needle, Disp, (B-D ECLIPSE SYRINGE) 27G X 1/2" 1 ML MISC 1 daily x 7 days, then once every 14 days.  50 each  0  . topiramate (TOPAMAX) 100 MG tablet Take 100 mg by mouth 2 (two) times daily.       . traMADol (ULTRAM) 50 MG tablet Take 2 tablets by mouth at bedtime.       . vitamin C (ASCORBIC ACID) 500 MG tablet Take 500 mg by mouth daily.        Marland Kitchen zolpidem (AMBIEN) 10 MG tablet       . [DISCONTINUED] beclomethasone (QVAR) 40 MCG/ACT inhaler Inhale 2 puffs into the lungs 2 (two)  times daily.  8.7 g  11  . [DISCONTINUED] levothyroxine (SYNTHROID, LEVOTHROID) 100 MCG tablet Take 1 tablet (100 mcg total) by mouth daily before breakfast.  90 tablet  3  . [DISCONTINUED] zolpidem (AMBIEN) 10 MG tablet TAKE 1 TABLET BY MOUTH AT BEDTIME AS NEEDED  30 tablet  5  . [DISCONTINUED] triamcinolone ointment (KENALOG) 0.5 % Apply topically 2 (two) times daily.  30 g  0   No facility-administered encounter medications on file as of 02/25/2013.

## 2013-02-25 NOTE — Assessment & Plan Note (Signed)
Pulmonary sarcoidosis with stable airway function Plan Continue Qvar two puff twice daily Return 6 months

## 2013-02-25 NOTE — Patient Instructions (Signed)
No changes in medications  Return 1 year.

## 2013-03-15 ENCOUNTER — Other Ambulatory Visit: Payer: Self-pay | Admitting: Critical Care Medicine

## 2013-03-15 NOTE — Telephone Encounter (Signed)
Qvar 40 rx sent on 02/25/13 # 8.7g x 11. Called [facility], spoke with Misty Stanley. Was advised rx was received.  Nothing further needed.

## 2013-04-02 DIAGNOSIS — M25569 Pain in unspecified knee: Secondary | ICD-10-CM | POA: Diagnosis not present

## 2013-04-02 DIAGNOSIS — M542 Cervicalgia: Secondary | ICD-10-CM | POA: Diagnosis not present

## 2013-04-09 ENCOUNTER — Other Ambulatory Visit: Payer: Self-pay | Admitting: Internal Medicine

## 2013-04-12 DIAGNOSIS — H04129 Dry eye syndrome of unspecified lacrimal gland: Secondary | ICD-10-CM | POA: Diagnosis not present

## 2013-04-12 DIAGNOSIS — H40019 Open angle with borderline findings, low risk, unspecified eye: Secondary | ICD-10-CM | POA: Diagnosis not present

## 2013-04-12 DIAGNOSIS — H2510 Age-related nuclear cataract, unspecified eye: Secondary | ICD-10-CM | POA: Diagnosis not present

## 2013-04-12 DIAGNOSIS — Z23 Encounter for immunization: Secondary | ICD-10-CM | POA: Diagnosis not present

## 2013-04-22 ENCOUNTER — Ambulatory Visit: Payer: Medicare Other | Attending: Orthopedic Surgery | Admitting: Physical Therapy

## 2013-04-22 DIAGNOSIS — M2560 Stiffness of unspecified joint, not elsewhere classified: Secondary | ICD-10-CM | POA: Diagnosis not present

## 2013-04-22 DIAGNOSIS — IMO0001 Reserved for inherently not codable concepts without codable children: Secondary | ICD-10-CM | POA: Diagnosis not present

## 2013-04-22 DIAGNOSIS — M542 Cervicalgia: Secondary | ICD-10-CM | POA: Diagnosis not present

## 2013-04-22 DIAGNOSIS — R5381 Other malaise: Secondary | ICD-10-CM | POA: Insufficient documentation

## 2013-04-22 DIAGNOSIS — R609 Edema, unspecified: Secondary | ICD-10-CM | POA: Insufficient documentation

## 2013-04-26 ENCOUNTER — Ambulatory Visit: Payer: Medicare Other | Admitting: *Deleted

## 2013-04-26 DIAGNOSIS — M542 Cervicalgia: Secondary | ICD-10-CM | POA: Diagnosis not present

## 2013-04-26 DIAGNOSIS — IMO0001 Reserved for inherently not codable concepts without codable children: Secondary | ICD-10-CM | POA: Diagnosis not present

## 2013-04-26 DIAGNOSIS — R609 Edema, unspecified: Secondary | ICD-10-CM | POA: Diagnosis not present

## 2013-04-26 DIAGNOSIS — M2560 Stiffness of unspecified joint, not elsewhere classified: Secondary | ICD-10-CM | POA: Diagnosis not present

## 2013-04-26 DIAGNOSIS — R5381 Other malaise: Secondary | ICD-10-CM | POA: Diagnosis not present

## 2013-04-29 ENCOUNTER — Ambulatory Visit: Payer: Medicare Other | Admitting: Physical Therapy

## 2013-04-29 DIAGNOSIS — L820 Inflamed seborrheic keratosis: Secondary | ICD-10-CM | POA: Diagnosis not present

## 2013-04-29 DIAGNOSIS — D1801 Hemangioma of skin and subcutaneous tissue: Secondary | ICD-10-CM | POA: Diagnosis not present

## 2013-04-29 DIAGNOSIS — R5381 Other malaise: Secondary | ICD-10-CM | POA: Diagnosis not present

## 2013-04-29 DIAGNOSIS — R609 Edema, unspecified: Secondary | ICD-10-CM | POA: Diagnosis not present

## 2013-04-29 DIAGNOSIS — IMO0001 Reserved for inherently not codable concepts without codable children: Secondary | ICD-10-CM | POA: Diagnosis not present

## 2013-04-29 DIAGNOSIS — D235 Other benign neoplasm of skin of trunk: Secondary | ICD-10-CM | POA: Diagnosis not present

## 2013-04-29 DIAGNOSIS — M2560 Stiffness of unspecified joint, not elsewhere classified: Secondary | ICD-10-CM | POA: Diagnosis not present

## 2013-04-29 DIAGNOSIS — L821 Other seborrheic keratosis: Secondary | ICD-10-CM | POA: Diagnosis not present

## 2013-04-29 DIAGNOSIS — M542 Cervicalgia: Secondary | ICD-10-CM | POA: Diagnosis not present

## 2013-05-03 ENCOUNTER — Ambulatory Visit: Payer: Medicare Other | Attending: Orthopedic Surgery | Admitting: *Deleted

## 2013-05-03 DIAGNOSIS — R5381 Other malaise: Secondary | ICD-10-CM | POA: Insufficient documentation

## 2013-05-03 DIAGNOSIS — M2560 Stiffness of unspecified joint, not elsewhere classified: Secondary | ICD-10-CM | POA: Diagnosis not present

## 2013-05-03 DIAGNOSIS — IMO0001 Reserved for inherently not codable concepts without codable children: Secondary | ICD-10-CM | POA: Diagnosis not present

## 2013-05-03 DIAGNOSIS — M542 Cervicalgia: Secondary | ICD-10-CM | POA: Diagnosis not present

## 2013-05-03 DIAGNOSIS — R609 Edema, unspecified: Secondary | ICD-10-CM | POA: Insufficient documentation

## 2013-05-06 ENCOUNTER — Ambulatory Visit: Payer: Medicare Other | Admitting: Physical Therapy

## 2013-05-06 DIAGNOSIS — M2560 Stiffness of unspecified joint, not elsewhere classified: Secondary | ICD-10-CM | POA: Diagnosis not present

## 2013-05-06 DIAGNOSIS — M542 Cervicalgia: Secondary | ICD-10-CM | POA: Diagnosis not present

## 2013-05-06 DIAGNOSIS — R609 Edema, unspecified: Secondary | ICD-10-CM | POA: Diagnosis not present

## 2013-05-06 DIAGNOSIS — R5381 Other malaise: Secondary | ICD-10-CM | POA: Diagnosis not present

## 2013-05-06 DIAGNOSIS — IMO0001 Reserved for inherently not codable concepts without codable children: Secondary | ICD-10-CM | POA: Diagnosis not present

## 2013-05-08 DIAGNOSIS — M25569 Pain in unspecified knee: Secondary | ICD-10-CM | POA: Diagnosis not present

## 2013-05-10 ENCOUNTER — Ambulatory Visit: Payer: Medicare Other | Admitting: *Deleted

## 2013-05-10 DIAGNOSIS — IMO0001 Reserved for inherently not codable concepts without codable children: Secondary | ICD-10-CM | POA: Diagnosis not present

## 2013-05-10 DIAGNOSIS — M542 Cervicalgia: Secondary | ICD-10-CM | POA: Diagnosis not present

## 2013-05-10 DIAGNOSIS — R5381 Other malaise: Secondary | ICD-10-CM | POA: Diagnosis not present

## 2013-05-10 DIAGNOSIS — R609 Edema, unspecified: Secondary | ICD-10-CM | POA: Diagnosis not present

## 2013-05-10 DIAGNOSIS — M2560 Stiffness of unspecified joint, not elsewhere classified: Secondary | ICD-10-CM | POA: Diagnosis not present

## 2013-05-13 ENCOUNTER — Ambulatory Visit: Payer: Medicare Other | Admitting: *Deleted

## 2013-05-13 DIAGNOSIS — IMO0001 Reserved for inherently not codable concepts without codable children: Secondary | ICD-10-CM | POA: Diagnosis not present

## 2013-05-13 DIAGNOSIS — M2560 Stiffness of unspecified joint, not elsewhere classified: Secondary | ICD-10-CM | POA: Diagnosis not present

## 2013-05-13 DIAGNOSIS — R609 Edema, unspecified: Secondary | ICD-10-CM | POA: Diagnosis not present

## 2013-05-13 DIAGNOSIS — M542 Cervicalgia: Secondary | ICD-10-CM | POA: Diagnosis not present

## 2013-05-13 DIAGNOSIS — R5381 Other malaise: Secondary | ICD-10-CM | POA: Diagnosis not present

## 2013-05-15 DIAGNOSIS — M1710 Unilateral primary osteoarthritis, unspecified knee: Secondary | ICD-10-CM | POA: Diagnosis not present

## 2013-05-15 DIAGNOSIS — IMO0002 Reserved for concepts with insufficient information to code with codable children: Secondary | ICD-10-CM | POA: Diagnosis not present

## 2013-05-17 ENCOUNTER — Ambulatory Visit: Payer: Medicare Other | Admitting: *Deleted

## 2013-05-17 DIAGNOSIS — R5381 Other malaise: Secondary | ICD-10-CM | POA: Diagnosis not present

## 2013-05-17 DIAGNOSIS — M542 Cervicalgia: Secondary | ICD-10-CM | POA: Diagnosis not present

## 2013-05-17 DIAGNOSIS — R609 Edema, unspecified: Secondary | ICD-10-CM | POA: Diagnosis not present

## 2013-05-17 DIAGNOSIS — M2560 Stiffness of unspecified joint, not elsewhere classified: Secondary | ICD-10-CM | POA: Diagnosis not present

## 2013-05-17 DIAGNOSIS — IMO0001 Reserved for inherently not codable concepts without codable children: Secondary | ICD-10-CM | POA: Diagnosis not present

## 2013-05-23 DIAGNOSIS — M1710 Unilateral primary osteoarthritis, unspecified knee: Secondary | ICD-10-CM | POA: Diagnosis not present

## 2013-05-23 DIAGNOSIS — IMO0002 Reserved for concepts with insufficient information to code with codable children: Secondary | ICD-10-CM | POA: Diagnosis not present

## 2013-05-24 ENCOUNTER — Ambulatory Visit: Payer: Medicare Other | Admitting: *Deleted

## 2013-05-24 DIAGNOSIS — M2560 Stiffness of unspecified joint, not elsewhere classified: Secondary | ICD-10-CM | POA: Diagnosis not present

## 2013-05-24 DIAGNOSIS — R5381 Other malaise: Secondary | ICD-10-CM | POA: Diagnosis not present

## 2013-05-24 DIAGNOSIS — M542 Cervicalgia: Secondary | ICD-10-CM | POA: Diagnosis not present

## 2013-05-24 DIAGNOSIS — IMO0001 Reserved for inherently not codable concepts without codable children: Secondary | ICD-10-CM | POA: Diagnosis not present

## 2013-05-24 DIAGNOSIS — R609 Edema, unspecified: Secondary | ICD-10-CM | POA: Diagnosis not present

## 2013-05-27 ENCOUNTER — Encounter: Payer: Medicare Other | Admitting: Internal Medicine

## 2013-06-06 ENCOUNTER — Other Ambulatory Visit: Payer: Self-pay

## 2013-06-07 ENCOUNTER — Ambulatory Visit: Payer: Medicare Other | Attending: Orthopedic Surgery | Admitting: *Deleted

## 2013-06-07 DIAGNOSIS — M542 Cervicalgia: Secondary | ICD-10-CM | POA: Insufficient documentation

## 2013-06-07 DIAGNOSIS — R609 Edema, unspecified: Secondary | ICD-10-CM | POA: Insufficient documentation

## 2013-06-07 DIAGNOSIS — IMO0001 Reserved for inherently not codable concepts without codable children: Secondary | ICD-10-CM | POA: Diagnosis not present

## 2013-06-07 DIAGNOSIS — R5381 Other malaise: Secondary | ICD-10-CM | POA: Insufficient documentation

## 2013-06-07 DIAGNOSIS — M2560 Stiffness of unspecified joint, not elsewhere classified: Secondary | ICD-10-CM | POA: Diagnosis not present

## 2013-06-21 ENCOUNTER — Encounter: Payer: Medicare Other | Admitting: *Deleted

## 2013-07-01 DIAGNOSIS — Z1231 Encounter for screening mammogram for malignant neoplasm of breast: Secondary | ICD-10-CM | POA: Diagnosis not present

## 2013-07-01 DIAGNOSIS — Z803 Family history of malignant neoplasm of breast: Secondary | ICD-10-CM | POA: Diagnosis not present

## 2013-07-19 ENCOUNTER — Other Ambulatory Visit (INDEPENDENT_AMBULATORY_CARE_PROVIDER_SITE_OTHER): Payer: Medicare Other

## 2013-07-19 ENCOUNTER — Encounter: Payer: Self-pay | Admitting: Internal Medicine

## 2013-07-19 ENCOUNTER — Ambulatory Visit (INDEPENDENT_AMBULATORY_CARE_PROVIDER_SITE_OTHER): Payer: Medicare Other | Admitting: Internal Medicine

## 2013-07-19 VITALS — BP 120/80 | HR 80 | Temp 97.6°F | Resp 16 | Ht 65.0 in | Wt 164.0 lb

## 2013-07-19 DIAGNOSIS — E039 Hypothyroidism, unspecified: Secondary | ICD-10-CM | POA: Diagnosis not present

## 2013-07-19 DIAGNOSIS — Z0000 Encounter for general adult medical examination without abnormal findings: Secondary | ICD-10-CM | POA: Diagnosis not present

## 2013-07-19 DIAGNOSIS — L853 Xerosis cutis: Secondary | ICD-10-CM

## 2013-07-19 DIAGNOSIS — K219 Gastro-esophageal reflux disease without esophagitis: Secondary | ICD-10-CM

## 2013-07-19 DIAGNOSIS — L258 Unspecified contact dermatitis due to other agents: Secondary | ICD-10-CM | POA: Diagnosis not present

## 2013-07-19 DIAGNOSIS — E538 Deficiency of other specified B group vitamins: Secondary | ICD-10-CM

## 2013-07-19 DIAGNOSIS — G4730 Sleep apnea, unspecified: Secondary | ICD-10-CM

## 2013-07-19 DIAGNOSIS — H9313 Tinnitus, bilateral: Secondary | ICD-10-CM

## 2013-07-19 DIAGNOSIS — H9319 Tinnitus, unspecified ear: Secondary | ICD-10-CM

## 2013-07-19 DIAGNOSIS — Z23 Encounter for immunization: Secondary | ICD-10-CM | POA: Diagnosis not present

## 2013-07-19 DIAGNOSIS — R51 Headache: Secondary | ICD-10-CM

## 2013-07-19 DIAGNOSIS — Z52008 Unspecified donor, other blood: Secondary | ICD-10-CM

## 2013-07-19 DIAGNOSIS — E559 Vitamin D deficiency, unspecified: Secondary | ICD-10-CM

## 2013-07-19 DIAGNOSIS — R519 Headache, unspecified: Secondary | ICD-10-CM | POA: Insufficient documentation

## 2013-07-19 LAB — URINALYSIS
Bilirubin Urine: NEGATIVE
Ketones, ur: NEGATIVE
Leukocytes, UA: NEGATIVE
Specific Gravity, Urine: 1.015 (ref 1.000–1.030)
Total Protein, Urine: NEGATIVE
Urobilinogen, UA: 0.2 (ref 0.0–1.0)
pH: 7.5 (ref 5.0–8.0)

## 2013-07-19 LAB — CBC WITH DIFFERENTIAL/PLATELET
Basophils Absolute: 0 10*3/uL (ref 0.0–0.1)
Basophils Relative: 0.5 % (ref 0.0–3.0)
Eosinophils Relative: 3.2 % (ref 0.0–5.0)
Hemoglobin: 12.9 g/dL (ref 12.0–15.0)
Lymphocytes Relative: 30 % (ref 12.0–46.0)
Lymphs Abs: 2.6 10*3/uL (ref 0.7–4.0)
MCHC: 33.3 g/dL (ref 30.0–36.0)
MCV: 85.3 fl (ref 78.0–100.0)
Monocytes Absolute: 0.8 10*3/uL (ref 0.1–1.0)
Monocytes Relative: 9.6 % (ref 3.0–12.0)
Neutro Abs: 4.9 10*3/uL (ref 1.4–7.7)
Platelets: 348 10*3/uL (ref 150.0–400.0)
RDW: 14.5 % (ref 11.5–14.6)
WBC: 8.6 10*3/uL (ref 4.5–10.5)

## 2013-07-19 LAB — BASIC METABOLIC PANEL
Calcium: 9.3 mg/dL (ref 8.4–10.5)
GFR: 71.29 mL/min (ref >60.00)
Glucose, Bld: 110 mg/dL — ABNORMAL HIGH (ref 70–99)
Potassium: 3.8 mEq/L (ref 3.5–5.1)
Sodium: 142 mEq/L (ref 135–145)

## 2013-07-19 LAB — LIPID PANEL
HDL: 64.4 mg/dL (ref >39.00)
LDL Cholesterol: 84 mg/dL (ref 0–99)
Triglycerides: 91 mg/dL (ref 0.0–149.0)
VLDL: 18.2 mg/dL (ref 0.0–40.0)

## 2013-07-19 LAB — HEPATIC FUNCTION PANEL
AST: 27 U/L (ref 0–37)
Albumin: 4.3 g/dL (ref 3.5–5.2)
Alkaline Phosphatase: 68 U/L (ref 39–117)
Bilirubin, Direct: 0.1 mg/dL (ref 0.0–0.3)
Total Bilirubin: 0.5 mg/dL (ref 0.3–1.2)

## 2013-07-19 LAB — TSH: TSH: 1.23 u[IU]/mL (ref 0.35–5.50)

## 2013-07-19 MED ORDER — ZOLPIDEM TARTRATE 10 MG PO TABS: 10.0000 mg | ORAL_TABLET | Freq: Every evening | ORAL | Status: DC | PRN

## 2013-07-19 MED ORDER — BUTALBITAL-ACETAMINOPHEN 50-300 MG PO TABS: 1.0000 | ORAL_TABLET | Freq: Two times a day (BID) | ORAL | Status: DC | PRN

## 2013-07-19 MED ORDER — LEVOTHYROXINE SODIUM 100 MCG PO TABS: ORAL_TABLET | ORAL | Status: DC

## 2013-07-19 NOTE — Assessment & Plan Note (Signed)
Continue with current prescription therapy as reflected on the Med list.  

## 2013-07-19 NOTE — Assessment & Plan Note (Signed)
Cont w/CPAP 

## 2013-07-19 NOTE — Assessment & Plan Note (Signed)

## 2013-07-19 NOTE — Progress Notes (Signed)
Pre visit review using our clinic review tool, if applicable. No additional management support is needed unless otherwise documented below in the visit note. 

## 2013-07-19 NOTE — Progress Notes (Signed)
   Subjective:    HPI The patient is here for a wellness exam. The patient has been doing well overall without major physical or psychological issues going on lately, except for a migraine now. C/o ringing in ears x 12 mo The patient presents for a follow-up of  chronic hypertension, chronic dyslipidemia, allergies, B12 deficiency controlled with medicines  Wt Readings from Last 3 Encounters:  07/19/13 164 lb (74.39 kg)  02/25/13 164 lb 12.8 oz (74.753 kg)  11/23/12 164 lb (74.39 kg)   BP Readings from Last 3 Encounters:  07/19/13 140/86  02/25/13 124/60  11/23/12 130/80       Review of Systems  Constitutional: Negative for chills, activity change, appetite change, fatigue and unexpected weight change.  HENT: Positive for rhinorrhea. Negative for congestion, mouth sores and sinus pressure.   Eyes: Negative for visual disturbance.  Respiratory: Negative for cough and chest tightness.   Gastrointestinal: Negative for nausea and abdominal pain.  Genitourinary: Negative for frequency, difficulty urinating and vaginal pain.  Musculoskeletal: Positive for arthralgias. Negative for back pain and gait problem.  Skin: Negative for pallor and rash.  Neurological: Negative for dizziness, tremors, weakness, numbness and headaches.  Psychiatric/Behavioral: Negative for confusion and sleep disturbance.       Objective:   Physical Exam  Constitutional: She appears well-developed. No distress.  obese  HENT:  Head: Normocephalic.  Right Ear: External ear normal.  Left Ear: External ear normal.  Nose: Nose normal.  Mouth/Throat: Oropharynx is clear and moist.  Eyes: Conjunctivae are normal. Pupils are equal, round, and reactive to light. Right eye exhibits no discharge. Left eye exhibits no discharge.  Neck: Normal range of motion. Neck supple. No JVD present. No tracheal deviation present. No thyromegaly present.  Cardiovascular: Normal rate, regular rhythm and normal heart sounds.    Pulmonary/Chest: No stridor. No respiratory distress. She has no wheezes.  Abdominal: Soft. Bowel sounds are normal. She exhibits no distension and no mass. There is no tenderness. There is no rebound and no guarding.  Musculoskeletal: She exhibits tenderness (LS spine). She exhibits no edema.  Lymphadenopathy:    She has no cervical adenopathy.  Neurological: She displays normal reflexes. No cranial nerve deficit. She exhibits normal muscle tone. Coordination normal.  Skin: No rash noted. No erythema.  Psychiatric: She has a normal mood and affect. Her behavior is normal. Judgment and thought content normal.    Lab Results  Component Value Date   WBC 9.5 11/23/2012   HGB 12.2 11/23/2012   HCT 36.3 11/23/2012   PLT 516.0* 11/23/2012   GLUCOSE 96 11/23/2012   CHOL 175 05/25/2012   TRIG 82.0 05/25/2012   HDL 65.50 05/25/2012   LDLCALC 93 05/25/2012   ALT 26 05/25/2012   AST 29 05/25/2012   NA 141 11/23/2012   K 3.8 11/23/2012   CL 106 11/23/2012   CREATININE 0.8 11/23/2012   BUN 17 11/23/2012   CO2 26 11/23/2012   TSH 0.70 11/23/2012         Assessment & Plan:

## 2013-07-19 NOTE — Assessment & Plan Note (Signed)
Bupap prn Cont Topamax

## 2013-07-19 NOTE — Assessment & Plan Note (Signed)
Try to taper down Topamax

## 2013-07-20 ENCOUNTER — Encounter: Payer: Self-pay | Admitting: Internal Medicine

## 2013-07-20 LAB — VITAMIN D 25 HYDROXY (VIT D DEFICIENCY, FRACTURES): Vit D, 25-Hydroxy: 43 ng/mL (ref 30–89)

## 2013-08-26 ENCOUNTER — Other Ambulatory Visit: Payer: Self-pay | Admitting: Critical Care Medicine

## 2013-09-07 ENCOUNTER — Other Ambulatory Visit: Payer: Self-pay | Admitting: Diagnostic Neuroimaging

## 2013-09-09 ENCOUNTER — Encounter: Payer: Self-pay | Admitting: Diagnostic Neuroimaging

## 2013-09-09 ENCOUNTER — Ambulatory Visit (INDEPENDENT_AMBULATORY_CARE_PROVIDER_SITE_OTHER): Payer: Medicare Other | Admitting: Diagnostic Neuroimaging

## 2013-09-09 VITALS — BP 115/73 | HR 62 | Temp 97.3°F | Ht 63.5 in | Wt 163.0 lb

## 2013-09-09 DIAGNOSIS — R51 Headache: Secondary | ICD-10-CM

## 2013-09-09 DIAGNOSIS — H9319 Tinnitus, unspecified ear: Secondary | ICD-10-CM | POA: Diagnosis not present

## 2013-09-09 NOTE — Progress Notes (Signed)
[facility]  PATIENT: Elizabeth Gray DOB: 27-Apr-1941  REFERRING CLINICIAN:  HISTORY FROM: patient  REASON FOR VISIT: follow up   HISTORICAL  CHIEF COMPLAINT:  Chief Complaint  Patient presents with  . Follow-up    migraine    HISTORY OF PRESENT ILLNESS:   UPDATE 09/09/13: Since last visit, was doing well until she developed tinnitus. Her PCP advised her to reduce TPX. She reduced to 13m BID, then to 222mBID. Tinnitus improved, but then HA worsened. Now back to 10020mID, but tinnitus has returned. 2 weeks ago had migraine (HA + nausea), took tylenol and went to sleep with resolution of HA.  UPDATE 09/07/12: Was doing better, then more HA x last 2 months. Not sleeping well at night. Wakes up multiple times to check on husband (who falls asleep in chair). Not much physical activity. HA are similar quality as before.  UPDATES 03/05/12:  Doing better.  No headaches since last visit.  She has had noticed floaters that appear like a feather; notices in am when she may not have had enough sleep, last episode 2 weeks ago.  She has a history of floaters since age [age] Tolerating TPX 100m49ms without any memory or focus problems or numbness/tingling.    UPDATE 10/31/11: Doing little bit better. On TPX 100mg46m; couldn't tolerate BID dosing. No side effects. Sinus pressure sensation has improved.  PRIOR HPI: 73 ye62 old ambidextrous, right dominant, female with history of pulmonary sarcoidosis, obstructive sleep apnea, osteoarthritis, here for evaluation of headaches since October 2012.  Patient reports feeling sinus infection symptoms and frontal headache in October 2012.  She was prescribed azithromycin.  Her symptoms did not improve.  She was then referred to ENT, had CT of the sinuses, which showed no significant sinus disease.  For the past 2 months she's been taking tramadol and ibuprofen on a daily basis for knee pain.  At the onset of the headache symptom she was  taking Sudafed but no other pain medications.  Patient reports pressure and mild throbbing sensation in the bifrontal and bitemporal regions.  No nausea, vomiting, photophobia or visual scotoma.  Sometimes she has mild blurred vision and phonophobia with her headaches.  Headache severity is 2/10 on a daily basis, sometimes increased to 4 or 5/10 once a week.  REVIEW OF SYSTEMS: Full 14 system review of systems performed and notable only for cough restless legs apnea.  ALLERGIES: Allergies  Allergen Reactions  . Metoclopramide Hcl Other (See Comments)    REGLAN="burning in mouth"  . Oxymetazoline Hcl Itching    AFRIN SPRAY  . Sucralfate Other (See Comments)    CARAFATE=Mouth sores    HOME MEDICATIONS: Outpatient Prescriptions Prior to Visit  Medication Sig Dispense Refill  . Alum Hydroxide-Mag Carbonate (GAVISCON PO) As directed as needed       . aspirin 81 MG tablet Take 81 mg by mouth daily.      . Butalbital-Acetaminophen (BUPAP) 50-300 MG TABS Take 1 tablet by mouth 2 (two) times daily as needed.  30 tablet  0  . Carboxymethylcellul-Glycerin (OPTIVE) 0.5-0.9 % SOLN Apply 1 drop to eye daily as needed.        . Cholecalciferol (VITAMIN D PO) Take 1,000 Units by mouth daily.       . cyanocobalamin (COBAL-1000) 1000 MCG/ML injection Inject 1 mL (1,000 mcg total) into the skin every 14 (fourteen) days. Start with daily injections x 7 d, then 1 injection every 2 wks  10 mL  6  . Flaxseed, Linseed, (FLAXSEED OIL) OIL Take 1 capsule by mouth 2 (two) times daily.        . Glucosamine-Chondroit-Vit C-Mn (GLUCOSAMINE CHONDR 1500 COMPLX PO) Take 1 tablet by mouth 2 (two) times daily.        Marland Kitchen levothyroxine (SYNTHROID, LEVOTHROID) 100 MCG tablet TAKE 1 TABLET EVERY DAY  90 tablet  3  . meclizine (ANTIVERT) 12.5 MG tablet TAKE 1 TO 2 TABLETS 4 TIMES A DAY AS NEEDED DIZZINESS  60 tablet  1  . methocarbamol (ROBAXIN) 500 MG tablet Take 500 mg by mouth at bedtime as needed.       . Multiple  Vitamins-Minerals (ICAPS AREDS FORMULA PO) Take 1 capsule by mouth daily.        Marland Kitchen omeprazole-sodium bicarbonate (ZEGERID) 40-1100 MG per capsule ONE BY MOUTH ONCE DAILY  30 capsule  6  . Pancrelipase, Lip-Prot-Amyl, (CREON) 24000 UNITS CPEP Take 1 capsule 3 times a day before meals.  90 capsule  6  . Polyethyl Glycol-Propyl Glycol (SYSTANE) 0.4-0.3 % SOLN 1 to 2 drops four times daily as needed       . Syringe/Needle, Disp, (B-D ECLIPSE SYRINGE) 27G X 1/2" 1 ML MISC 1 daily x 7 days, then once every 14 days.  50 each  0  . topiramate (TOPAMAX) 100 MG tablet TAKE 1 TABLET BY MOUTH TWICE A DAY  60 tablet  0  . traMADol (ULTRAM) 50 MG tablet Take 2 tablets by mouth at bedtime.       . vitamin C (ASCORBIC ACID) 500 MG tablet Take 500 mg by mouth daily.        Marland Kitchen zolpidem (AMBIEN) 10 MG tablet Take 1 tablet (10 mg total) by mouth at bedtime as needed for sleep.  90 tablet  1  . QVAR 40 MCG/ACT inhaler INHALE 2 PUFFS INTO THE LUNGS 2 (TWO) TIMES DAILY.  8.7 g  5  . beclomethasone (QVAR) 40 MCG/ACT inhaler Inhale 2 puffs into the lungs 2 (two) times daily.  8.7 g  11   No facility-administered medications prior to visit.    PAST MEDICAL HISTORY: Past Medical History  Diagnosis Date  . Nonorganic sleep disorder, unspecified   . Fatigue   . Pulmonary sarcoidosis   . Hypothyroidism   . Polio 1948  . Vitamin D deficiency   . Vitamin B12 deficiency   . Osteoarthritis   . GERD (gastroesophageal reflux disease)   . Pancreatitis   . Personal history of colonic polyps   . Iron deficiency anemia, unspecified   . Lower esophageal ring 2008    EGD  . External hemorrhoids without mention of complication 7510    Colonoscopy   . Bursitis     PAST SURGICAL HISTORY: Past Surgical History  Procedure Laterality Date  . Appendectomy  1988  . Lumbar disc surgery  1995  . Knee arthroscopy      multiple  . Cholecystectomy  2000  . Carpal tunnel release  2002    rt  . Hand surgery  1997    left  .  Colonoscopy  12/19/2006    Multiple diminutive polyps destroyed-removed (hyperpastic), internal hemorrhoids  . Esophagogastroduodenoscopy  12/19/2006    lower esophageal ring dilated    FAMILY HISTORY: Family History  Problem Relation Age of Onset  . Breast cancer Paternal Aunt   . Brain cancer Father     brain tumor  . Colon cancer Neg Hx   . Migraines Mother  SOCIAL HISTORY:  History   Social History  . Marital Status: Married    Spouse Name: [name]    Number of Children: 0  . Years of Education: college   Occupational History  . Statistician     works three days per week   . LEGAL ASSISTANT 3d/wk    Social History Main Topics  . Smoking status: Former Smoker -- 1.00 packs/day for 17 years    Types: Cigarettes    Quit date: 08/01/1990  . Smokeless tobacco: Never Used  . Alcohol Use: No  . Drug Use: No  . Sexual Activity: Not on file   Other Topics Concern  . Not on file   Social History Narrative   Patient lives at home with her spouse.   Caffeine Use: none     PHYSICAL EXAM  Filed Vitals:   09/09/13 0829  BP: 115/73  Pulse: 62  Temp: 97.3 F (36.3 C)  TempSrc: Oral  Height: 5' 3.5" (1.613 m)  Weight: 163 lb (73.936 kg)    Not recorded    Body mass index is 28.42 kg/(m^2).  GENERAL EXAM: Patient is in no distress; well developed, nourished and groomed; neck is supple  CARDIOVASCULAR: Regular rate and rhythm, no murmurs, no carotid bruits  NEUROLOGIC: MENTAL STATUS: awake, alert, oriented to person, place and time, recent and remote memory intact, normal attention and concentration, language fluent, comprehension intact, naming intact, fund of knowledge appropriate CRANIAL NERVE: no papilledema on fundoscopic exam, pupils equal and reactive to light, visual fields full to confrontation, extraocular muscles intact, no nystagmus, facial sensation and strength symmetric, hearing intact, palate elevates symmetrically, uvula midline, shoulder  shrug symmetric, tongue midline. MOTOR: normal bulk and tone, full strength in the BUE, BLE SENSORY: normal and symmetric to light touch COORDINATION: finger-nose-finger, fine finger movements normal REFLEXES: deep tendon reflexes present and symmetric GAIT/STATION: narrow based gait; able to walk tandem; romberg is negative    DIAGNOSTIC DATA (LABS, IMAGING, TESTING) - I reviewed patient records, labs, notes, testing and imaging myself where available.  Lab Results  Component Value Date   WBC 8.6 07/19/2013   HGB 12.9 07/19/2013   HCT 38.8 07/19/2013   MCV 85.3 07/19/2013   PLT 348.0 07/19/2013      Component Value Date/Time   NA 142 07/19/2013 0842   K 3.8 07/19/2013 0842   CL 108 07/19/2013 0842   CO2 28 07/19/2013 0842   GLUCOSE 110* 07/19/2013 0842   BUN 16 07/19/2013 0842   CREATININE 0.8 07/19/2013 0842   CALCIUM 9.3 07/19/2013 0842   PROT 7.5 07/19/2013 0842   ALBUMIN 4.3 07/19/2013 0842   AST 27 07/19/2013 0842   ALT 22 07/19/2013 0842   ALKPHOS 68 07/19/2013 0842   BILITOT 0.5 07/19/2013 0842   GFRNONAA 93.76 03/05/2010 0909   GFRAA 127 02/12/2008 1511   Lab Results  Component Value Date   CHOL 167 07/19/2013   HDL 64.40 07/19/2013   LDLCALC 84 07/19/2013   TRIG 91.0 07/19/2013   CHOLHDL 3 07/19/2013   No results found for this basename: HGBA1C   Lab Results  Component Value Date   GGYIRSWN46 270 08/31/2012   Lab Results  Component Value Date   TSH 1.23 07/19/2013    06/20/11 CT sinus - no significant sinus disease; mild bulging of left nasal concha  09/16/11 MRI brain - left CP angle arachnoid cyst, otherwise normal  09/09/11 ESR, CRP - normal    ASSESSMENT AND PLAN  72  y.o. year old female here with pulmonary sarcoidosis, sleep apnea, with intermittent headaches (migraine variant). Was doing well on TPX, but now with dose dependent tinnitus (likely TPX side effect).  Dx: migraine variant  PLAN: 1. Reduce TPX to 177m qhs or 535mBID 2.  May consider topiramate ER in future  Return in about 6 months (around 03/09/2014) for with LyCharlott Hollerr Tanisia Yokley.    VIPenni BombardMD 2/3/8/87199:5:97M Certified in Neurology, Neurophysiology and Neuroimaging  [facility] [address] [phone]

## 2013-09-09 NOTE — Patient Instructions (Signed)
Try topiramate 100mg  at bedtime or 50mg  twice a day.  We may consider extended release topiramate in the future.

## 2013-10-01 ENCOUNTER — Other Ambulatory Visit: Payer: Self-pay | Admitting: Internal Medicine

## 2013-10-22 ENCOUNTER — Encounter: Payer: Self-pay | Admitting: Diagnostic Neuroimaging

## 2013-10-31 ENCOUNTER — Other Ambulatory Visit: Payer: Self-pay | Admitting: Diagnostic Neuroimaging

## 2013-10-31 MED ORDER — TOPIRAMATE ER 100 MG PO CAP24: 100.0000 mg | ORAL_CAPSULE | Freq: Every day | ORAL | Status: DC

## 2013-11-12 ENCOUNTER — Other Ambulatory Visit: Payer: Self-pay | Admitting: Internal Medicine

## 2013-11-14 ENCOUNTER — Other Ambulatory Visit: Payer: Self-pay | Admitting: Internal Medicine

## 2013-11-14 ENCOUNTER — Encounter: Payer: Self-pay | Admitting: Diagnostic Neuroimaging

## 2013-12-04 DIAGNOSIS — M1710 Unilateral primary osteoarthritis, unspecified knee: Secondary | ICD-10-CM | POA: Diagnosis not present

## 2013-12-11 DIAGNOSIS — M1710 Unilateral primary osteoarthritis, unspecified knee: Secondary | ICD-10-CM | POA: Diagnosis not present

## 2013-12-18 DIAGNOSIS — M1710 Unilateral primary osteoarthritis, unspecified knee: Secondary | ICD-10-CM | POA: Diagnosis not present

## 2013-12-30 ENCOUNTER — Other Ambulatory Visit: Payer: Self-pay | Admitting: Diagnostic Neuroimaging

## 2013-12-30 NOTE — Telephone Encounter (Signed)
Please see patient email regarding ER form not being covered.

## 2014-01-13 DIAGNOSIS — H40019 Open angle with borderline findings, low risk, unspecified eye: Secondary | ICD-10-CM | POA: Diagnosis not present

## 2014-01-13 DIAGNOSIS — H2510 Age-related nuclear cataract, unspecified eye: Secondary | ICD-10-CM | POA: Diagnosis not present

## 2014-01-13 DIAGNOSIS — H04129 Dry eye syndrome of unspecified lacrimal gland: Secondary | ICD-10-CM | POA: Diagnosis not present

## 2014-01-20 ENCOUNTER — Ambulatory Visit: Payer: Medicare Other | Admitting: Internal Medicine

## 2014-01-27 ENCOUNTER — Encounter: Payer: Self-pay | Admitting: Internal Medicine

## 2014-01-27 ENCOUNTER — Ambulatory Visit (INDEPENDENT_AMBULATORY_CARE_PROVIDER_SITE_OTHER): Payer: Medicare Other | Admitting: Internal Medicine

## 2014-01-27 ENCOUNTER — Other Ambulatory Visit (INDEPENDENT_AMBULATORY_CARE_PROVIDER_SITE_OTHER): Payer: Medicare Other

## 2014-01-27 VITALS — BP 112/68 | HR 72 | Temp 98.3°F | Resp 16 | Wt 162.0 lb

## 2014-01-27 DIAGNOSIS — H9319 Tinnitus, unspecified ear: Secondary | ICD-10-CM

## 2014-01-27 DIAGNOSIS — E538 Deficiency of other specified B group vitamins: Secondary | ICD-10-CM | POA: Diagnosis not present

## 2014-01-27 DIAGNOSIS — E559 Vitamin D deficiency, unspecified: Secondary | ICD-10-CM | POA: Diagnosis not present

## 2014-01-27 DIAGNOSIS — E039 Hypothyroidism, unspecified: Secondary | ICD-10-CM | POA: Diagnosis not present

## 2014-01-27 DIAGNOSIS — R51 Headache: Secondary | ICD-10-CM

## 2014-01-27 DIAGNOSIS — H9313 Tinnitus, bilateral: Secondary | ICD-10-CM

## 2014-01-27 DIAGNOSIS — F519 Sleep disorder not due to a substance or known physiological condition, unspecified: Secondary | ICD-10-CM

## 2014-01-27 LAB — LIPID PANEL
CHOL/HDL RATIO: 2
CHOLESTEROL: 150 mg/dL (ref 0–200)
HDL: 66.9 mg/dL (ref >39.00)
LDL Cholesterol: 63 mg/dL (ref 0–99)
NonHDL: 83.1
TRIGLYCERIDES: 100 mg/dL (ref 0.0–149.0)
VLDL: 20 mg/dL (ref 0.0–40.0)

## 2014-01-27 LAB — BASIC METABOLIC PANEL
BUN: 21 mg/dL (ref 6–23)
CALCIUM: 9.5 mg/dL (ref 8.4–10.5)
CO2: 25 meq/L (ref 19–32)
Chloride: 107 mEq/L (ref 96–112)
Creatinine, Ser: 0.8 mg/dL (ref 0.4–1.2)
GFR: 75.37 mL/min (ref >60.00)
Glucose, Bld: 101 mg/dL — ABNORMAL HIGH (ref 70–99)
Potassium: 4.2 mEq/L (ref 3.5–5.1)
Sodium: 140 mEq/L (ref 135–145)

## 2014-01-27 LAB — TSH: TSH: 1.72 u[IU]/mL (ref 0.35–4.50)

## 2014-01-27 NOTE — Progress Notes (Signed)
   Subjective:    HPI C/o R knee OA - TKR planned for 05/09/14  C/o ringing in ears x 12 mo The patient presents for a follow-up of  chronic hypertension, chronic dyslipidemia, allergies, B12 deficiency controlled with medicines  Wt Readings from Last 3 Encounters:  01/27/14 162 lb (73.483 kg)  09/09/13 163 lb (73.936 kg)  07/19/13 164 lb (74.39 kg)   BP Readings from Last 3 Encounters:  01/27/14 112/68  09/09/13 115/73  07/19/13 120/80       Review of Systems  Constitutional: Negative for chills, activity change, appetite change, fatigue and unexpected weight change.  HENT: Positive for rhinorrhea. Negative for congestion, mouth sores and sinus pressure.   Eyes: Negative for visual disturbance.  Respiratory: Negative for cough and chest tightness.   Gastrointestinal: Negative for nausea and abdominal pain.  Genitourinary: Negative for frequency, difficulty urinating and vaginal pain.  Musculoskeletal: Positive for arthralgias. Negative for back pain and gait problem.  Skin: Negative for pallor and rash.  Neurological: Negative for dizziness, tremors, weakness, numbness and headaches.  Psychiatric/Behavioral: Negative for confusion and sleep disturbance.       Objective:   Physical Exam  Constitutional: She appears well-developed. No distress.  obese  HENT:  Head: Normocephalic.  Right Ear: External ear normal.  Left Ear: External ear normal.  Nose: Nose normal.  Mouth/Throat: Oropharynx is clear and moist.  Eyes: Conjunctivae are normal. Pupils are equal, round, and reactive to light. Right eye exhibits no discharge. Left eye exhibits no discharge.  Neck: Normal range of motion. Neck supple. No JVD present. No tracheal deviation present. No thyromegaly present.  Cardiovascular: Normal rate, regular rhythm and normal heart sounds.   Pulmonary/Chest: No stridor. No respiratory distress. She has no wheezes.  Abdominal: Soft. Bowel sounds are normal. She exhibits no  distension and no mass. There is no tenderness. There is no rebound and no guarding.  Musculoskeletal: She exhibits tenderness (LS spine). She exhibits no edema.  Lymphadenopathy:    She has no cervical adenopathy.  Neurological: She displays normal reflexes. No cranial nerve deficit. She exhibits normal muscle tone. Coordination normal.  Skin: No rash noted. No erythema.  Psychiatric: She has a normal mood and affect. Her behavior is normal. Judgment and thought content normal.  R knee is tender w/ROM  Lab Results  Component Value Date   WBC 8.6 07/19/2013   HGB 12.9 07/19/2013   HCT 38.8 07/19/2013   PLT 348.0 07/19/2013   GLUCOSE 110* 07/19/2013   CHOL 167 07/19/2013   TRIG 91.0 07/19/2013   HDL 64.40 07/19/2013   LDLCALC 84 07/19/2013   ALT 22 07/19/2013   AST 27 07/19/2013   NA 142 07/19/2013   K 3.8 07/19/2013   CL 108 07/19/2013   CREATININE 0.8 07/19/2013   BUN 16 07/19/2013   CO2 28 07/19/2013   TSH 1.23 07/19/2013         Assessment & Plan:

## 2014-01-27 NOTE — Progress Notes (Signed)
Pre visit review using our clinic review tool, if applicable. No additional management support is needed unless otherwise documented below in the visit note. 

## 2014-01-27 NOTE — Assessment & Plan Note (Signed)
Chronic. 

## 2014-01-27 NOTE — Assessment & Plan Note (Signed)
Continue with current prescription therapy as reflected on the Med list.  

## 2014-01-27 NOTE — Assessment & Plan Note (Signed)
No change 

## 2014-02-04 ENCOUNTER — Other Ambulatory Visit: Payer: Self-pay | Admitting: Internal Medicine

## 2014-02-21 ENCOUNTER — Encounter: Payer: Self-pay | Admitting: Critical Care Medicine

## 2014-02-21 ENCOUNTER — Ambulatory Visit: Payer: Medicare Other | Admitting: Critical Care Medicine

## 2014-02-21 ENCOUNTER — Ambulatory Visit (INDEPENDENT_AMBULATORY_CARE_PROVIDER_SITE_OTHER): Payer: Medicare Other | Admitting: Critical Care Medicine

## 2014-02-21 VITALS — BP 136/72 | HR 60 | Temp 98.3°F | Ht 63.5 in | Wt 162.8 lb

## 2014-02-21 DIAGNOSIS — D869 Sarcoidosis, unspecified: Secondary | ICD-10-CM | POA: Diagnosis not present

## 2014-02-21 MED ORDER — BECLOMETHASONE DIPROPIONATE 40 MCG/ACT IN AERS: INHALATION_SPRAY | RESPIRATORY_TRACT | Status: DC

## 2014-02-21 NOTE — Patient Instructions (Signed)
We sent refills for qvar We will schedule you to have a Pre and Post Spirometry  Follow up with Dr. Joya Gaskins in 1 year Please call sooner if needed

## 2014-02-21 NOTE — Assessment & Plan Note (Signed)
Pulmonary sarcoidosis with endobronchial sarcoid Plan Maintain Qvar 2 puff twice daily

## 2014-02-21 NOTE — Progress Notes (Signed)
Subjective:    Patient ID: Elizabeth Gray, female    DOB: [birth year], 73 y.o.   MRN: [id number]  HPI  This is a 73 y.o.  white female with stage II sarcoidosis.  tightness.   02/21/2014 Chief Complaint  Patient presents with  . Yearly Follow up    Breathing doing well overall.  No SOB, wheezing, chest tightness/pain, or cough at this time.  No changes in past year. No cough, no chest pain. No flare ups. On qvar.  No dry eyes. Pt denies any significant sore throat, nasal congestion or excess secretions, fever, chills, sweats, unintended weight loss, pleurtic or exertional chest pain, orthopnea PND, or leg swelling Pt denies any increase in rescue therapy over baseline, denies waking up needing it or having any early am or nocturnal exacerbations of coughing/wheezing/or dyspnea. Pt also denies any obvious fluctuation in symptoms with  weather or environmental change or other alleviating or aggravating factors Pt on cpap at night. Needs TKR       Review of Systems  Constitutional:   No  weight loss, night sweats,  Fevers, chills, fatigue, lassitude. HEENT:   No headaches,  Difficulty swallowing,  Tooth/dental problems,  Sore throat,                No sneezing, itching, ear ache, nasal congestion, post nasal drip,   Mild epistaxsis  CV:  No chest pain,  Orthopnea, PND, swelling in lower extremities, anasarca, dizziness, palpitations  GI  No heartburn, indigestion, abdominal pain, nausea, vomiting, diarrhea, change in bowel habits, loss of appetite  Resp: No  shortness of breath with exertion or at rest.  No excess mucus, no productive cough,  No non-productive cough,  No coughing up of blood.  No change in color of mucus.  No wheezing.  No chest wall deformity  Skin: no rash or lesions.  GU: no dysuria, change in color of urine, no urgency or frequency.  No flank pain.  MS:  No joint pain or swelling.  No decreased range of motion.  No back pain.  Psych:  No change in mood or  affect. No depression or anxiety.  No memory loss.     Objective:   Physical Exam  Filed Vitals:   02/21/14 1155  BP: 136/72  Pulse: 60  Temp: 98.3 F (36.8 C)  TempSrc: Oral  Height: 5' 3.5" (1.613 m)  Weight: 162 lb 12.8 oz (73.846 kg)  SpO2: 99%    Gen: Pleasant, well-nourished, in no distress,  normal affect  ENT: No lesions,  mouth clear,  oropharynx clear, no postnasal drip, dry mucus membranes  Neck: No JVD, no TMG, no carotid bruits  Lungs: No use of accessory muscles, no dullness to percussion, clear without rales or rhonchi  Cardiovascular: RRR, heart sounds normal, no murmur or gallops, no peripheral edema  Abdomen: soft and NT, no HSM,  BS normal  Musculoskeletal: No deformities, no cyanosis or clubbing  Neuro: alert, non focal  Skin: Warm, no lesions or rashes        Assessment & Plan:   SARCOIDOSIS, PULMONARY Pulmonary sarcoidosis with endobronchial sarcoid Plan Maintain Qvar 2 puff twice daily     Updated Medication List Outpatient Encounter Prescriptions as of 02/21/2014  Medication Sig  . Alum Hydroxide-Mag Carbonate (GAVISCON PO) As directed as needed   . aspirin 81 MG tablet Take 81 mg by mouth daily.  . beclomethasone (QVAR) 40 MCG/ACT inhaler INHALE 2 PUFFS INTO THE LUNGS 2 (TWO) TIMES DAILY.  Marland Kitchen  Cholecalciferol (VITAMIN D PO) Take 1,000 Units by mouth daily.   Marland Kitchen CREON 24000 UNITS CPEP TAKE 1 CAPSULE 3 TIMES A DAY BEFORE MEALS.  . cyanocobalamin (,VITAMIN B-12,) 1000 MCG/ML injection INJECT 1 ML INTO SKIN EVERY 14 DAYS START WITH DAILY X 7DAYS THEN 1 EVERY 2 WEEKS  . Flaxseed, Linseed, (FLAXSEED OIL) OIL Take 1 capsule by mouth 2 (two) times daily.    . Glucosamine-Chondroit-Vit C-Mn (GLUCOSAMINE CHONDR 1500 COMPLX PO) Take 1 tablet by mouth 2 (two) times daily.    Marland Kitchen levothyroxine (SYNTHROID, LEVOTHROID) 100 MCG tablet TAKE 1 TABLET EVERY DAY  . magnesium oxide (MAG-OX) 400 MG tablet Take 400 mg by mouth at bedtime.  . Multiple  Vitamins-Minerals (ICAPS AREDS FORMULA PO) Take 1 capsule by mouth daily.    Marland Kitchen omeprazole-sodium bicarbonate (ZEGERID) 40-1100 MG per capsule TAKE ONE CAPSULE BY MOUTH ONCE DAILY  . Polyethyl Glycol-Propyl Glycol (SYSTANE ULTRA) 0.4-0.3 % SOLN Apply to eye as needed.  . Syringe/Needle, Disp, (B-D ECLIPSE SYRINGE) 27G X 1/2" 1 ML MISC 1 daily x 7 days, then once every 14 days.  Marland Kitchen topiramate (TOPAMAX) 100 MG tablet TAKE 1 TABLET BY MOUTH TWICE A DAY  . traMADol (ULTRAM) 50 MG tablet Take 2 tablets by mouth at bedtime.   . vitamin C (ASCORBIC ACID) 500 MG tablet Take 500 mg by mouth daily.    Marland Kitchen zolpidem (AMBIEN) 10 MG tablet Take 10 mg by mouth at bedtime as needed for sleep. Marland Kitchen5 tab @@ bedtime  . [DISCONTINUED] levothyroxine (SYNTHROID, LEVOTHROID) 100 MCG tablet TAKE 1 TABLET EVERY DAY  . [DISCONTINUED] QVAR 40 MCG/ACT inhaler INHALE 2 PUFFS INTO THE LUNGS 2 (TWO) TIMES DAILY.  . [DISCONTINUED] Carboxymethylcellul-Glycerin (OPTIVE) 0.5-0.9 % SOLN Apply 1 drop to eye daily as needed.    . [DISCONTINUED] meclizine (ANTIVERT) 12.5 MG tablet TAKE 1 TO 2 TABLETS 4 TIMES A DAY AS NEEDED DIZZINESS  . [DISCONTINUED] methocarbamol (ROBAXIN) 500 MG tablet Take 500 mg by mouth at bedtime as needed.   . [DISCONTINUED] Polyethyl Glycol-Propyl Glycol (SYSTANE) 0.4-0.3 % SOLN 1 to 2 drops four times daily as needed

## 2014-03-06 ENCOUNTER — Telehealth: Payer: Self-pay | Admitting: Critical Care Medicine

## 2014-03-06 NOTE — Telephone Encounter (Signed)
Received faxed surgery clearance from [facility] for pending right knee: TKA medial and lateral w/wo patella resurfacing.   Dr. Joya Gaskins has completed form giving pt clearance.   I have faxed this to [facility] at [phone]. Form placed in scan folder.

## 2014-03-10 ENCOUNTER — Ambulatory Visit (INDEPENDENT_AMBULATORY_CARE_PROVIDER_SITE_OTHER): Payer: Medicare Other | Admitting: Nurse Practitioner

## 2014-03-10 ENCOUNTER — Encounter: Payer: Self-pay | Admitting: Nurse Practitioner

## 2014-03-10 VITALS — BP 118/69 | HR 71 | Ht 63.5 in | Wt 164.6 lb

## 2014-03-10 DIAGNOSIS — R51 Headache: Secondary | ICD-10-CM | POA: Diagnosis not present

## 2014-03-10 DIAGNOSIS — H9319 Tinnitus, unspecified ear: Secondary | ICD-10-CM | POA: Diagnosis not present

## 2014-03-10 NOTE — Progress Notes (Signed)
PATIENT: Elizabeth Gray DOB: [birth year]  REASON FOR VISIT: routine follow up for Migraine HISTORY FROM: patient  HISTORY OF PRESENT ILLNESS: UPDATE 03/10/14 (LL): Patient is tolerating tinnitus with TPX 100 mg bid for less frequent, less severe headaches. No other known side effects. Pleased with current treatment. Planning right total knee replacement in October.  UPDATE 09/09/13: Since last visit, was doing well until she developed tinnitus. Her PCP advised her to reduce TPX. She reduced to 26m BID, then to 249mBID. Tinnitus improved, but then HA worsened. Now back to 10028mID, but tinnitus has returned. 2 weeks ago had migraine (HA + nausea), took tylenol and went to sleep with resolution of HA.  UPDATE 09/07/12: Was doing better, then more HA x last 2 months. Not sleeping well at night. Wakes up multiple times to check on husband (who falls asleep in chair). Not much physical activity. HA are similar quality as before.  UPDATES 03/05/12: Doing better. No headaches since last visit. She has had noticed floaters that appear like a feather; notices in am when she may not have had enough sleep, last episode 2 weeks ago. She has a history of floaters since age 73.32olerating TPX 100m70ms without any memory or focus problems or numbness/tingling.  UPDATE 10/31/11: Doing little bit better. On TPX 100mg30m; couldn't tolerate BID dosing. No side effects. Sinus pressure sensation has improved.  PRIOR HPI: 73 ye69 old ambidextrous, right dominant, female with history of pulmonary sarcoidosis, obstructive sleep apnea, osteoarthritis, here for evaluation of headaches since October 2012.  Patient reports feeling sinus infection symptoms and frontal headache in October 2012. She was prescribed azithromycin. Her symptoms did not improve. She was then referred to ENT, had CT of the sinuses, which showed no significant sinus disease. For the past 2 months she's been taking tramadol and ibuprofen on a daily basis  for knee pain. At the onset of the headache symptom she was taking Sudafed but no other pain medications.  Patient reports pressure and mild throbbing sensation in the bifrontal and bitemporal regions. No nausea, vomiting, photophobia or visual scotoma. Sometimes she has mild blurred vision and phonophobia with her headaches. Headache severity is 2/10 on a daily basis, sometimes increased to 4 or 5/10 once a week.   REVIEW OF SYSTEMS: Full 14 system review of systems performed and notable only for cough restless legs apnea.   ALLERGIES: Allergies  Allergen Reactions  . Metoclopramide Hcl Other (See Comments)    REGLAN="burning in mouth"  . Oxymetazoline Hcl Itching    AFRIN SPRAY  . Sucralfate Other (See Comments)    CARAFATE=Mouth sores    HOME MEDICATIONS: Outpatient Prescriptions Prior to Visit  Medication Sig Dispense Refill  . Alum Hydroxide-Mag Carbonate (GAVISCON PO) As directed as needed       . aspirin 81 MG tablet Take 81 mg by mouth daily.      . beclomethasone (QVAR) 40 MCG/ACT inhaler INHALE 2 PUFFS INTO THE LUNGS 2 (TWO) TIMES DAILY.  8.7 g  11  . Cholecalciferol (VITAMIN D PO) Take 1,000 Units by mouth daily.       . CREMarland KitchenN 24000 UNITS CPEP TAKE 1 CAPSULE 3 TIMES A DAY BEFORE MEALS.  90 capsule  2  . cyanocobalamin (,VITAMIN B-12,) 1000 MCG/ML injection INJECT 1 ML INTO SKIN EVERY 14 DAYS START WITH DAILY X 7DAYS THEN 1 EVERY 2 WEEKS  10 mL  3  . Flaxseed, Linseed, (FLAXSEED OIL) OIL Take 1 capsule by mouth  2 (two) times daily.        . Glucosamine-Chondroit-Vit C-Mn (GLUCOSAMINE CHONDR 1500 COMPLX PO) Take 1 tablet by mouth 2 (two) times daily.        Marland Kitchen levothyroxine (SYNTHROID, LEVOTHROID) 100 MCG tablet TAKE 1 TABLET EVERY DAY  90 tablet  2  . magnesium oxide (MAG-OX) 400 MG tablet Take 400 mg by mouth at bedtime.      . Multiple Vitamins-Minerals (ICAPS AREDS FORMULA PO) Take 1 capsule by mouth daily.        Marland Kitchen omeprazole-sodium bicarbonate (ZEGERID) 40-1100 MG per  capsule TAKE ONE CAPSULE BY MOUTH ONCE DAILY  30 capsule  2  . Polyethyl Glycol-Propyl Glycol (SYSTANE ULTRA) 0.4-0.3 % SOLN Apply to eye as needed.      . Syringe/Needle, Disp, (B-D ECLIPSE SYRINGE) 27G X 1/2" 1 ML MISC 1 daily x 7 days, then once every 14 days.  50 each  0  . topiramate (TOPAMAX) 100 MG tablet TAKE 1 TABLET BY MOUTH TWICE A DAY  60 tablet  6  . traMADol (ULTRAM) 50 MG tablet Take 2 tablets by mouth at bedtime.       . vitamin C (ASCORBIC ACID) 500 MG tablet Take 500 mg by mouth daily.        Marland Kitchen zolpidem (AMBIEN) 10 MG tablet Take 10 mg by mouth at bedtime as needed for sleep. Marland Kitchen5 tab @@ bedtime       No facility-administered medications prior to visit.    PHYSICAL EXAM Filed Vitals:   03/10/14 0910  BP: 118/69  Pulse: 71  Height: 5' 3.5" (1.613 m)  Weight: 164 lb 9.6 oz (74.662 kg)   Body mass index is 28.7 kg/(m^2).  Generalized: Well developed, in no acute distress  Head: normocephalic and atraumatic. Oropharynx benign  Neck: Supple, no carotid bruits  Cardiac: Regular rate rhythm, no murmur  Musculoskeletal: No deformity   Neurological examination  Mentation: Alert oriented to time, place, history taking. Follows all commands speech and language fluent Cranial nerve II-XII: Fundoscopic exam not done. Pupils were equal round reactive to light extraocular movements were full, visual field were full on confrontational test. Facial sensation and strength were normal. hearing was intact to finger rubbing bilaterally. Uvula tongue midline. head turning and shoulder shrug and were normal and symmetric.Tongue protrusion into cheek strength was normal. Motor: The motor testing reveals 5 over 5 strength of all 4 extremities. Good symmetric motor tone is noted throughout.  Sensory: Sensory testing is intact to soft touch on all 4 extremities. No evidence of extinction is noted.  Coordination: Cerebellar testing reveals good finger-nose-finger and heel-to-shin bilaterally.    Gait and station: Gait is normal. Tandem gait is normal. Romberg is negative. Reflexes: Deep tendon reflexes are symmetric and normal bilaterally.   DIAGNOSTIC DATA (LABS, IMAGING, TESTING) - I reviewed patient records, labs, notes, testing and imaging myself where available. 06/20/11 CT sinus - no significant sinus disease; mild bulging of left nasal concha  09/16/11 MRI brain - left CP angle arachnoid cyst, otherwise normal  09/09/11 ESR, CRP - normal   ASSESSMENT: 73 y.o. female here with pulmonary sarcoidosis, sleep apnea, with intermittent headaches (migraine variant). Doing well on TPX, but with dose dependent tinnitus (likely TPX side effect) - will tolerate tinnitus for benefit of less frequent and less severe headaches.  Dx: migraine variant   PLAN:  Continue TPX 139m bid.  Return in about 6 months with LCharlott Holleror Penumalli, sooner as needed.  Laurencia Roma E.  Cortland Crehan, MSN, FNP-BC, A/GNP-C 03/10/2014, 9:13 AM [facility] [address] [place], [place] [address] [phone]  Note: This document was prepared with digital dictation and possible smart phrase technology. Any transcriptional errors that result from this process are unintentional.

## 2014-03-10 NOTE — Patient Instructions (Addendum)
PLAN:  Continue Topamax 100mg  twice daily.   Return in about 6 months with Charlott Holler or Penumalli, sooner as needed.

## 2014-03-17 ENCOUNTER — Ambulatory Visit (INDEPENDENT_AMBULATORY_CARE_PROVIDER_SITE_OTHER): Payer: Medicare Other | Admitting: Critical Care Medicine

## 2014-03-17 DIAGNOSIS — D869 Sarcoidosis, unspecified: Secondary | ICD-10-CM

## 2014-03-17 LAB — PULMONARY FUNCTION TEST
FEF 25-75 PRE: 1.91 L/s
FEF 25-75 Post: 2.75 L/sec
FEF2575-%CHANGE-POST: 44 %
FEF2575-%Pred-Post: 170 %
FEF2575-%Pred-Pre: 118 %
FEV1-%CHANGE-POST: 8 %
FEV1-%PRED-PRE: 107 %
FEV1-%Pred-Post: 115 %
FEV1-PRE: 2.2 L
FEV1-Post: 2.38 L
FEV1FVC-%Change-Post: 5 %
FEV1FVC-%PRED-PRE: 103 %
FEV6-%Change-Post: 2 %
FEV6-%PRED-POST: 111 %
FEV6-%Pred-Pre: 108 %
FEV6-Post: 2.9 L
FEV6-Pre: 2.82 L
FEV6FVC-%PRED-PRE: 105 %
FEV6FVC-%Pred-Post: 105 %
FVC-%Change-Post: 2 %
FVC-%PRED-POST: 105 %
FVC-%PRED-PRE: 102 %
FVC-POST: 2.9 L
FVC-Pre: 2.82 L
POST FEV6/FVC RATIO: 100 %
Post FEV1/FVC ratio: 82 %
Pre FEV1/FVC ratio: 78 %
Pre FEV6/FVC Ratio: 100 %

## 2014-03-17 NOTE — Progress Notes (Signed)
Spirometry before and after done today. 

## 2014-03-20 NOTE — Progress Notes (Signed)
I reviewed note and agree with plan.   VIKRAM R. PENUMALLI, MD  Certified in Neurology, Neurophysiology and Neuroimaging  [facility] [address], [address] [place], [place] [address] [phone]   

## 2014-03-25 ENCOUNTER — Telehealth: Payer: Self-pay | Admitting: Critical Care Medicine

## 2014-03-25 NOTE — Telephone Encounter (Signed)
Called and spoke with pt and she stated that her husband told her that RA had called her and wanted her to call back.  RA please advise. thanks

## 2014-03-25 NOTE — Telephone Encounter (Signed)
I did not call. 

## 2014-03-25 NOTE — Telephone Encounter (Signed)
Called and spoke with pt and she stated that she will wait until she gets home to check her caller ID.  Nothing further is needed.

## 2014-03-26 ENCOUNTER — Encounter: Payer: Self-pay | Admitting: Internal Medicine

## 2014-03-31 DIAGNOSIS — Z23 Encounter for immunization: Secondary | ICD-10-CM | POA: Diagnosis not present

## 2014-03-31 NOTE — Progress Notes (Signed)
Quick Note:  Called, spoke with pt. Informed her of PFT results and recs per Dr. Joya Gaskins. She verbalized understanding and voiced no further questions or concerns at this time. ______

## 2014-04-02 ENCOUNTER — Encounter: Payer: Self-pay | Admitting: *Deleted

## 2014-04-02 NOTE — Telephone Encounter (Signed)
Letter faxed to Dr. [name] per PCP.

## 2014-04-03 ENCOUNTER — Telehealth: Payer: Self-pay | Admitting: Pulmonary Disease

## 2014-04-03 DIAGNOSIS — G4730 Sleep apnea, unspecified: Secondary | ICD-10-CM

## 2014-04-03 NOTE — Telephone Encounter (Signed)
Will forward to [facility] to handle this

## 2014-04-03 NOTE — Telephone Encounter (Signed)
i am fine with this order 

## 2014-04-03 NOTE — Telephone Encounter (Signed)
Called spoke with patient who reported that PW stated that he will sign/authorize her CPAP supplies every year.  She was recently seen on 7.24.15 and forgot to mention this.  Pt is requesting we call her insurance company Medicare (w/ AARP supplement) to inform them of this.  She did just receive new CPAP supplies and is on a 3 month schedule.  Dr Joya Gaskins please advise if this is okay to do.  Thank you. Insurance # [id number] Medicare ID # [id number]

## 2014-04-04 NOTE — Telephone Encounter (Signed)
[facility]  will need referral order in, please put order in for this request

## 2014-04-08 NOTE — Telephone Encounter (Signed)
Order placed. Will forward to [facility].

## 2014-04-08 NOTE — Telephone Encounter (Signed)
Order given to [facility]

## 2014-04-17 ENCOUNTER — Other Ambulatory Visit: Payer: Self-pay | Admitting: Internal Medicine

## 2014-04-23 ENCOUNTER — Telehealth: Payer: Self-pay | Admitting: Internal Medicine

## 2014-04-23 MED ORDER — PANCRELIPASE (LIP-PROT-AMYL) 24000-76000 UNITS PO CPEP: 24000.0000 [IU] | ORAL_CAPSULE | Freq: Three times a day (TID) | ORAL | Status: DC

## 2014-04-23 NOTE — Telephone Encounter (Signed)
yes

## 2014-04-23 NOTE — Telephone Encounter (Signed)
Dr. Carlean Purl,   Patient is scheduled for follow up visit 06-20-2014 Is it okay to refill Creon?

## 2014-04-25 ENCOUNTER — Encounter (HOSPITAL_COMMUNITY): Payer: Self-pay

## 2014-04-26 NOTE — Pre-Procedure Instructions (Addendum)
KYLENE ZAMARRON  04/26/2014   Your procedure is scheduled on:  Fri, Oct 9   Report to [facility] Entrance A  at 5:30 AM.  Call this number if you have problems the morning of surgery: [phone]   Remember:   Do not eat food or drink liquids after midnight.   Take these medicines the morning of surgery with A SIP OF WATER: QVAR(Beclomethasone),Synthroid(Levothyroxine),Zegerid(Omeprazole-SodiumBicarb),Topamax(Topiramate),and Tramadol(Ultram-if needed)      Take all meds as ordered until day of surgery except as instructed below or per dr               Stop taking your Aspirin,Flax Seed,Vitamins,or any Herbal Medications(glucosamine magnesium,supplement-ming,wan,. No Goody's,BC's,Aleve,Ibuprofen,or Fish Oil.starting 05/04/14)   Do not wear jewelry, make-up or nail polish.  Do not wear lotions, powders, or perfumes. You may wear deodorant.  Do not shave 48 hours prior to surgery.   Do not bring valuables to the hospital.  [facility] is not responsible                  for any belongings or valuables.               Contacts, dentures or bridgework may not be worn into surgery.  Leave suitcase in the car. After surgery it may be brought to your room.  For patients admitted to the hospital, discharge time is determined by your                treatment team.               Patients discharged the day of surgery will not be allowed to drive  home.    Special Instructions:  [facility] - Preparing for Surgery  Before surgery, you can play an important role.  Because skin is not sterile, your skin needs to be as free of germs as possible.  You can reduce the number of germs on you skin by washing with CHG (chlorahexidine gluconate) soap before surgery.  CHG is an antiseptic cleaner which kills germs and bonds with the skin to continue killing germs even after washing.  Please DO NOT use if you have an allergy to CHG or antibacterial soaps.  If your skin becomes reddened/irritated stop using  the CHG and inform your nurse when you arrive at Short Stay.  Do not shave (including legs and underarms) for at least 48 hours prior to the first CHG shower.  You may shave your face.  Please follow these instructions carefully:   1.  Shower with CHG Soap the night before surgery and the                                morning of Surgery.  2.  If you choose to wash your hair, wash your hair first as usual with your       normal shampoo.  3.  After you shampoo, rinse your hair and body thoroughly to remove the                      Shampoo.  4.  Use CHG as you would any other liquid soap.  You can apply chg directly       to the skin and wash gently with scrungie or a clean washcloth.  5.  Apply the CHG Soap to your body ONLY FROM THE NECK DOWN.  Do not use on open wounds or open sores.  Avoid contact with your eyes,       ears, mouth and genitals (private parts).  Wash genitals (private parts)       with your normal soap.  6.  Wash thoroughly, paying special attention to the area where your surgery        will be performed.  7.  Thoroughly rinse your body with warm water from the neck down.  8.  DO NOT shower/wash with your normal soap after using and rinsing off       the CHG Soap.  9.  Pat yourself dry with a clean towel.            10.  Wear clean pajamas.            11.  Place clean sheets on your bed the night of your first shower and do not        sleep with pets.  Day of Surgery  Do not apply any lotions/deoderants the morning of surgery.  Please wear clean clothes to the hospital/surgery center.     Please read over the following fact sheets that you were given: Pain Booklet, Coughing and Deep Breathing, MRSA Information and Surgical Site Infection Prevention

## 2014-04-28 ENCOUNTER — Encounter (HOSPITAL_COMMUNITY): Payer: Self-pay

## 2014-04-28 ENCOUNTER — Encounter (HOSPITAL_COMMUNITY)
Admission: RE | Admit: 2014-04-28 | Discharge: 2014-04-28 | Disposition: A | Discharge status: Home / Self Care | Payer: Medicare Other | Source: Ambulatory Visit | Attending: Orthopedic Surgery | Admitting: Orthopedic Surgery

## 2014-04-28 ENCOUNTER — Ambulatory Visit (HOSPITAL_COMMUNITY)
Admission: RE | Admit: 2014-04-28 | Discharge: 2014-04-28 | Disposition: A | Discharge status: Home / Self Care | Payer: Medicare Other | Source: Ambulatory Visit | Attending: Anesthesiology | Admitting: Anesthesiology

## 2014-04-28 DIAGNOSIS — D869 Sarcoidosis, unspecified: Secondary | ICD-10-CM | POA: Insufficient documentation

## 2014-04-28 DIAGNOSIS — M1990 Unspecified osteoarthritis, unspecified site: Secondary | ICD-10-CM | POA: Insufficient documentation

## 2014-04-28 DIAGNOSIS — Z01818 Encounter for other preprocedural examination: Secondary | ICD-10-CM | POA: Insufficient documentation

## 2014-04-28 DIAGNOSIS — G4730 Sleep apnea, unspecified: Secondary | ICD-10-CM | POA: Diagnosis not present

## 2014-04-28 HISTORY — DX: Headache: R51

## 2014-04-28 HISTORY — DX: Sleep apnea, unspecified: G47.30

## 2014-04-28 LAB — PROTIME-INR
INR: 1.03 (ref 0.00–1.49)
PROTHROMBIN TIME: 13.5 s (ref 11.6–15.2)

## 2014-04-28 LAB — APTT: aPTT: 31 seconds (ref 24–37)

## 2014-04-28 LAB — SURGICAL PCR SCREEN
MRSA, PCR: NEGATIVE
Staphylococcus aureus: NEGATIVE

## 2014-04-28 LAB — CBC
HCT: 39.2 % (ref 36.0–46.0)
Hemoglobin: 12.8 g/dL (ref 12.0–15.0)
MCH: 29 pg (ref 26.0–34.0)
MCHC: 32.7 g/dL (ref 30.0–36.0)
MCV: 88.9 fL (ref 78.0–100.0)
PLATELETS: 279 10*3/uL (ref 150–400)
RBC: 4.41 MIL/uL (ref 3.87–5.11)
RDW: 13.8 % (ref 11.5–15.5)
WBC: 8.3 10*3/uL (ref 4.0–10.5)

## 2014-04-28 LAB — BASIC METABOLIC PANEL
Anion gap: 11 (ref 5–15)
BUN: 19 mg/dL (ref 6–23)
CALCIUM: 9.1 mg/dL (ref 8.4–10.5)
CO2: 24 mEq/L (ref 19–32)
Chloride: 107 mEq/L (ref 96–112)
Creatinine, Ser: 0.66 mg/dL (ref 0.50–1.10)
GFR calc Af Amer: >90 mL/min (ref >90)
GFR, EST NON AFRICAN AMERICAN: 84 mL/min — AB (ref >90)
GLUCOSE: 132 mg/dL — AB (ref 70–99)
POTASSIUM: 3.9 meq/L (ref 3.7–5.3)
Sodium: 142 mEq/L (ref 137–147)

## 2014-04-28 LAB — TYPE AND SCREEN
ABO/RH(D): A POS
Antibody Screen: NEGATIVE

## 2014-04-28 LAB — ABO/RH: ABO/RH(D): A POS

## 2014-04-29 NOTE — H&P (Signed)
Elizabeth Gray is an 73 y.o. female.    Chief Complaint: right knee pain  HPI: Pt is a 73 y.o. female complaining of right knee pain for multiple years. Pain had continually increased since the beginning. X-rays in the clinic show end-stage arthritic changes of the right knee. Pt has tried various conservative treatments which have failed to alleviate their symptoms, including injections and therapy. Various options are discussed with the patient. Risks, benefits and expectations were discussed with the patient. Patient understand the risks, benefits and expectations and wishes to proceed with surgery.   PCP:  Walker Kehr, MD  D/C Plans:  Home with HHPT  PMH: Past Medical History  Diagnosis Date  . Nonorganic sleep disorder, unspecified   . Fatigue   . Pulmonary sarcoidosis   . Hypothyroidism   . Polio 1948  . Vitamin D deficiency   . Vitamin B12 deficiency   . Osteoarthritis   . GERD (gastroesophageal reflux disease)   . Pancreatitis   . Personal history of colonic polyps   . Iron deficiency anemia, unspecified   . Lower esophageal ring 2008    EGD  . External hemorrhoids without mention of complication 3810    Colonoscopy   . Bursitis   . Sleep apnea     cpap since 09 [facility]  . Headache(784.0)     migraines    PSH: Past Surgical History  Procedure Laterality Date  . Appendectomy  1988  . Lumbar disc surgery  1995  . Knee arthroscopy Bilateral     multiple 2 on lft 1 on rt  . Cholecystectomy  2000  . Carpal tunnel release  2002    rt  . Hand surgery  1997    left  . Colonoscopy  12/19/2006    Multiple diminutive polyps destroyed-removed (hyperpastic), internal hemorrhoids  . Esophagogastroduodenoscopy  12/19/2006    lower esophageal ring dilated  . Toe surgery Left     left foot next to little toe  joint rem    Social History:  reports that she quit smoking about 23 years ago. Her smoking use included Cigarettes. She has a 17  pack-year smoking history. She has never used smokeless tobacco. She reports that she does not drink alcohol or use illicit drugs.  Allergies:  Allergies  Allergen Reactions  . Metoclopramide Hcl Other (See Comments)    REGLAN="burning in mouth"  . Oxymetazoline Hcl Itching    AFRIN SPRAY  . Sucralfate Other (See Comments)    CARAFATE=Mouth sores    Medications: No current facility-administered medications for this encounter.   Current Outpatient Prescriptions  Medication Sig Dispense Refill  . Alum Hydroxide-Mag Carbonate (GAVISCON PO) Take 1 tablet by mouth as needed (reflux). As directed as needed      . aspirin 81 MG tablet Take 81 mg by mouth daily.      . beclomethasone (QVAR) 40 MCG/ACT inhaler Inhale 2 puffs into the lungs 2 (two) times daily.      . Cholecalciferol (VITAMIN D PO) Take 1,000 Units by mouth daily.       . cyanocobalamin (,VITAMIN B-12,) 1000 MCG/ML injection Inject 1,000 mcg into the muscle every 14 (fourteen) days.      . Flaxseed, Linseed, (FLAXSEED OIL) OIL Take 1 capsule by mouth 2 (two) times daily.        . Glucosamine-Chondroit-Vit C-Mn (GLUCOSAMINE CHONDR 1500 COMPLX PO) Take 1 tablet by mouth 2 (two) times daily.        Marland Kitchen  levothyroxine (SYNTHROID, LEVOTHROID) 100 MCG tablet Take 100 mcg by mouth daily before breakfast.      . magnesium oxide (MAG-OX) 400 MG tablet Take 400 mg by mouth at bedtime.      Marland Kitchen omeprazole-sodium bicarbonate (ZEGERID) 40-1100 MG per capsule Take 1 capsule by mouth daily before breakfast.      . OVER THE COUNTER MEDICATION Take 5 tablets by mouth 2 (two) times daily. Supplement - [place]      . Pancrelipase, Lip-Prot-Amyl, (CREON) 24000 UNITS CPEP Take 1 capsule (24,000 Units total) by mouth 3 (three) times daily.  90 capsule  1  . Polyethyl Glycol-Propyl Glycol (SYSTANE ULTRA) 0.4-0.3 % SOLN Apply 1 drop to eye as needed (dry eyes).       . topiramate (TOPAMAX) 100 MG tablet Take 100 mg by mouth 2 (two) times daily.       . traMADol (ULTRAM) 50 MG tablet Take 100 mg by mouth at bedtime.       . vitamin C (ASCORBIC ACID) 500 MG tablet Take 500 mg by mouth daily.        Marland Kitchen zolpidem (AMBIEN) 10 MG tablet Take 5 mg by mouth at bedtime as needed for sleep.       . Multiple Vitamins-Minerals (ICAPS AREDS FORMULA PO) Take 1 capsule by mouth daily.          Results for orders placed during the hospital encounter of 04/28/14 (from the past 48 hour(s))  SURGICAL PCR SCREEN     Status: None   Collection Time    04/28/14  2:35 PM      Result Value Ref Range   MRSA, PCR NEGATIVE  NEGATIVE   Staphylococcus aureus NEGATIVE  NEGATIVE   Comment:            The Xpert SA Assay (FDA     approved for NASAL specimens     in patients over 43 years of age),     is one component of     a comprehensive surveillance     program.  Test performance has     been validated by [employer] for patients greater     than or equal to 42 year old.     It is not intended     to diagnose infection nor to     guide or monitor treatment.  BASIC METABOLIC PANEL     Status: Abnormal   Collection Time    04/28/14  2:35 PM      Result Value Ref Range   Sodium 142  137 - 147 mEq/L   Potassium 3.9  3.7 - 5.3 mEq/L   Chloride 107  96 - 112 mEq/L   CO2 24  19 - 32 mEq/L   Glucose, Bld 132 (*) 70 - 99 mg/dL   BUN 19  6 - 23 mg/dL   Creatinine, Ser 0.66  0.50 - 1.10 mg/dL   Calcium 9.1  8.4 - 10.5 mg/dL   GFR calc non Af Amer 84 (*) >90 mL/min   GFR calc Af Amer >90  >90 mL/min   Comment: (NOTE)     The eGFR has been calculated using the CKD EPI equation.     This calculation has not been validated in all clinical situations.     eGFR's persistently <90 mL/min signify possible Chronic Kidney     Disease.   Anion gap 11  5 - 15  CBC  Status: None   Collection Time    04/28/14  2:35 PM      Result Value Ref Range   WBC 8.3  4.0 - 10.5 K/uL   RBC 4.41  3.87 - 5.11 MIL/uL   Hemoglobin 12.8  12.0 - 15.0 g/dL   HCT 39.2  36.0  - 46.0 %   MCV 88.9  78.0 - 100.0 fL   MCH 29.0  26.0 - 34.0 pg   MCHC 32.7  30.0 - 36.0 g/dL   RDW 13.8  11.5 - 15.5 %   Platelets 279  150 - 400 K/uL  PROTIME-INR     Status: None   Collection Time    04/28/14  2:35 PM      Result Value Ref Range   Prothrombin Time 13.5  11.6 - 15.2 seconds   INR 1.03  0.00 - 1.49  APTT     Status: None   Collection Time    04/28/14  2:35 PM      Result Value Ref Range   aPTT 31  24 - 37 seconds  TYPE AND SCREEN     Status: None   Collection Time    04/28/14  2:39 PM      Result Value Ref Range   ABO/RH(D) A POS     Antibody Screen NEG     Sample Expiration 05/12/2014    ABO/RH     Status: None   Collection Time    04/28/14  2:39 PM      Result Value Ref Range   ABO/RH(D) A POS     Dg Chest 2 View  04/28/2014   CLINICAL DATA:  Sarcoidosis. Preop for knee replacement. Sleep apnea. History of bronchitis.  EXAM: CHEST  2 VIEW  COMPARISON:  11/21/2008  FINDINGS: Heart size is normal. There is mild prominence of interstitial markings. No focal consolidations or pleural effusions. No plain film evidence for adenopathy. Surgical clips are noted in the right upper quadrant of the abdomen. Visualized osseous structures have a normal appearance.  IMPRESSION: 1. No focal pulmonary abnormality. 2. Mildly prominent interstitial markings consistent with known sarcoidosis.   Electronically Signed   By: Shon Hale M.D.   On: 04/28/2014 15:21    ROS: Pain with rom of the right lower extremity  Physical Exam:  Alert and oriented 73 y.o. female in no acute distress Cranial nerves 2-12 intact Cervical spine: full rom with no tenderness, nv intact distally Chest: active breath sounds bilaterally, no wheeze rhonchi or rales Heart: regular rate and rhythm, no murmur Abd: non tender non distended with active bowel sounds Hip is stable with rom  Right knee with moderate tenderness and crepitus with rom  Pain with ambulation nv intact distally No  rashes  Assessment/Plan Assessment: right knee end stage osteoarthritis  Plan: Patient will undergo a right total knee arthroplasty by Dr. [name] at [facility]. Risks benefits and expectations were discussed with the patient. Patient understand risks, benefits and expectations and wishes to proceed.

## 2014-05-07 ENCOUNTER — Other Ambulatory Visit: Payer: Self-pay

## 2014-05-07 MED ORDER — OMEPRAZOLE-SODIUM BICARBONATE 40-1100 MG PO CAPS: 1.0000 | ORAL_CAPSULE | Freq: Every day | ORAL | Status: DC

## 2014-05-08 MED ORDER — CEFAZOLIN SODIUM-DEXTROSE 2-3 GM-% IV SOLR
2.0000 g | INTRAVENOUS | Status: AC
  Administered 2014-05-09: 2 g via INTRAVENOUS
  Filled 2014-05-08: qty 50

## 2014-05-09 ENCOUNTER — Encounter (HOSPITAL_COMMUNITY): Payer: Medicare Other | Admitting: Anesthesiology

## 2014-05-09 ENCOUNTER — Encounter (HOSPITAL_COMMUNITY): Payer: Self-pay | Admitting: *Deleted

## 2014-05-09 ENCOUNTER — Inpatient Hospital Stay (HOSPITAL_COMMUNITY): Payer: Medicare Other | Admitting: Anesthesiology

## 2014-05-09 ENCOUNTER — Inpatient Hospital Stay (HOSPITAL_COMMUNITY): Payer: Medicare Other

## 2014-05-09 ENCOUNTER — Inpatient Hospital Stay (HOSPITAL_COMMUNITY)
Admission: RE | Admit: 2014-05-09 | Discharge: 2014-05-12 | DRG: 470 | Disposition: A | Discharge status: Home Health | Payer: Medicare Other | Source: Ambulatory Visit | Attending: Orthopedic Surgery | Admitting: Orthopedic Surgery

## 2014-05-09 ENCOUNTER — Encounter (HOSPITAL_COMMUNITY)
Admission: RE | Disposition: A | Discharge status: Home Health | Payer: Self-pay | Source: Ambulatory Visit | Attending: Orthopedic Surgery

## 2014-05-09 DIAGNOSIS — G8918 Other acute postprocedural pain: Secondary | ICD-10-CM | POA: Diagnosis not present

## 2014-05-09 DIAGNOSIS — Z96659 Presence of unspecified artificial knee joint: Secondary | ICD-10-CM

## 2014-05-09 DIAGNOSIS — M1711 Unilateral primary osteoarthritis, right knee: Secondary | ICD-10-CM | POA: Diagnosis not present

## 2014-05-09 DIAGNOSIS — M179 Osteoarthritis of knee, unspecified: Secondary | ICD-10-CM | POA: Diagnosis not present

## 2014-05-09 DIAGNOSIS — K219 Gastro-esophageal reflux disease without esophagitis: Secondary | ICD-10-CM | POA: Diagnosis present

## 2014-05-09 DIAGNOSIS — E039 Hypothyroidism, unspecified: Secondary | ICD-10-CM | POA: Diagnosis present

## 2014-05-09 DIAGNOSIS — Z8612 Personal history of poliomyelitis: Secondary | ICD-10-CM | POA: Diagnosis not present

## 2014-05-09 DIAGNOSIS — J9811 Atelectasis: Secondary | ICD-10-CM | POA: Diagnosis not present

## 2014-05-09 DIAGNOSIS — Z79899 Other long term (current) drug therapy: Secondary | ICD-10-CM | POA: Diagnosis not present

## 2014-05-09 DIAGNOSIS — E538 Deficiency of other specified B group vitamins: Secondary | ICD-10-CM | POA: Diagnosis present

## 2014-05-09 DIAGNOSIS — Z7982 Long term (current) use of aspirin: Secondary | ICD-10-CM | POA: Diagnosis not present

## 2014-05-09 DIAGNOSIS — Z471 Aftercare following joint replacement surgery: Secondary | ICD-10-CM | POA: Diagnosis not present

## 2014-05-09 DIAGNOSIS — E559 Vitamin D deficiency, unspecified: Secondary | ICD-10-CM | POA: Diagnosis not present

## 2014-05-09 DIAGNOSIS — Z87891 Personal history of nicotine dependence: Secondary | ICD-10-CM

## 2014-05-09 DIAGNOSIS — Z96651 Presence of right artificial knee joint: Secondary | ICD-10-CM | POA: Diagnosis not present

## 2014-05-09 DIAGNOSIS — G4730 Sleep apnea, unspecified: Secondary | ICD-10-CM | POA: Diagnosis present

## 2014-05-09 DIAGNOSIS — M25561 Pain in right knee: Secondary | ICD-10-CM | POA: Diagnosis not present

## 2014-05-09 HISTORY — DX: Miliaria rubra: L74.0

## 2014-05-09 HISTORY — PX: TOTAL KNEE ARTHROPLASTY: SHX125

## 2014-05-09 LAB — CBC
HCT: 33.9 % — ABNORMAL LOW (ref 36.0–46.0)
Hemoglobin: 11.1 g/dL — ABNORMAL LOW (ref 12.0–15.0)
MCH: 28.7 pg (ref 26.0–34.0)
MCHC: 32.7 g/dL (ref 30.0–36.0)
MCV: 87.6 fL (ref 78.0–100.0)
PLATELETS: 241 10*3/uL (ref 150–400)
RBC: 3.87 MIL/uL (ref 3.87–5.11)
RDW: 13.8 % (ref 11.5–15.5)
WBC: 12.1 10*3/uL — ABNORMAL HIGH (ref 4.0–10.5)

## 2014-05-09 LAB — PROTIME-INR
INR: 1.13 (ref 0.00–1.49)
PROTHROMBIN TIME: 14.5 s (ref 11.6–15.2)

## 2014-05-09 SURGERY — ARTHROPLASTY, KNEE, TOTAL
Anesthesia: Regional | Site: Knee | Laterality: Right

## 2014-05-09 MED ORDER — SUCCINYLCHOLINE CHLORIDE 20 MG/ML IJ SOLN
INTRAMUSCULAR | Status: AC
  Filled 2014-05-09: qty 1

## 2014-05-09 MED ORDER — PHENYLEPHRINE HCL 10 MG/ML IJ SOLN
INTRAMUSCULAR | Status: DC | PRN
  Administered 2014-05-09 (×2): 80 ug via INTRAVENOUS
  Administered 2014-05-09: 160 ug via INTRAVENOUS
  Administered 2014-05-09: 80 ug via INTRAVENOUS

## 2014-05-09 MED ORDER — OXYCODONE HCL 5 MG PO TABS: 5.0000 mg | ORAL_TABLET | Freq: Once | ORAL | Status: DC | PRN

## 2014-05-09 MED ORDER — ALUM HYDROXIDE-MAG CARBONATE 95-358 MG/15ML PO SUSP
30.0000 mL | ORAL | Status: DC | PRN
  Filled 2014-05-09: qty 30

## 2014-05-09 MED ORDER — SODIUM CHLORIDE 0.9 % IJ SOLN
INTRAMUSCULAR | Status: AC
  Filled 2014-05-09: qty 10

## 2014-05-09 MED ORDER — ROCURONIUM BROMIDE 50 MG/5ML IV SOLN
INTRAVENOUS | Status: AC
  Filled 2014-05-09: qty 1

## 2014-05-09 MED ORDER — FLUTICASONE PROPIONATE HFA 44 MCG/ACT IN AERO
2.0000 | INHALATION_SPRAY | Freq: Two times a day (BID) | RESPIRATORY_TRACT | Status: DC
  Administered 2014-05-09 – 2014-05-12 (×6): 2 via RESPIRATORY_TRACT
  Filled 2014-05-09: qty 10.6

## 2014-05-09 MED ORDER — PHENYLEPHRINE 40 MCG/ML (10ML) SYRINGE FOR IV PUSH (FOR BLOOD PRESSURE SUPPORT)
PREFILLED_SYRINGE | INTRAVENOUS | Status: AC
  Filled 2014-05-09: qty 10

## 2014-05-09 MED ORDER — PANTOPRAZOLE SODIUM 40 MG PO TBEC
80.0000 mg | DELAYED_RELEASE_TABLET | Freq: Every day | ORAL | Status: DC
  Administered 2014-05-10 – 2014-05-12 (×3): 80 mg via ORAL
  Filled 2014-05-09 (×2): qty 2

## 2014-05-09 MED ORDER — ACETAMINOPHEN 325 MG PO TABS
650.0000 mg | ORAL_TABLET | Freq: Four times a day (QID) | ORAL | Status: DC | PRN
  Administered 2014-05-10 – 2014-05-12 (×3): 650 mg via ORAL
  Filled 2014-05-09 (×3): qty 2

## 2014-05-09 MED ORDER — METOCLOPRAMIDE HCL 5 MG/ML IJ SOLN
5.0000 mg | Freq: Three times a day (TID) | INTRAMUSCULAR | Status: DC | PRN
Start: 2014-05-09 — End: 2014-05-09

## 2014-05-09 MED ORDER — HYDROMORPHONE HCL 1 MG/ML IJ SOLN
0.2500 mg | INTRAMUSCULAR | Status: DC | PRN
  Administered 2014-05-09 (×4): 0.5 mg via INTRAVENOUS

## 2014-05-09 MED ORDER — ONDANSETRON HCL 4 MG/2ML IJ SOLN
INTRAMUSCULAR | Status: AC
  Filled 2014-05-09: qty 2

## 2014-05-09 MED ORDER — HYDROMORPHONE HCL 1 MG/ML IJ SOLN
INTRAMUSCULAR | Status: AC
  Filled 2014-05-09: qty 1

## 2014-05-09 MED ORDER — COUMADIN BOOK
Freq: Once | Status: AC
  Administered 2014-05-09: 18:00:00
  Filled 2014-05-09: qty 1

## 2014-05-09 MED ORDER — ONDANSETRON HCL 4 MG/2ML IJ SOLN
INTRAMUSCULAR | Status: DC | PRN
  Administered 2014-05-09: 4 mg via INTRAVENOUS

## 2014-05-09 MED ORDER — BUPIVACAINE-EPINEPHRINE (PF) 0.5% -1:200000 IJ SOLN
INTRAMUSCULAR | Status: DC | PRN
  Administered 2014-05-09: 30 mL via PERINEURAL

## 2014-05-09 MED ORDER — METHOCARBAMOL 500 MG PO TABS
500.0000 mg | ORAL_TABLET | Freq: Four times a day (QID) | ORAL | Status: DC | PRN
  Administered 2014-05-10 – 2014-05-11 (×4): 500 mg via ORAL
  Filled 2014-05-09 (×4): qty 1

## 2014-05-09 MED ORDER — ZOLPIDEM TARTRATE 5 MG PO TABS: 5.0000 mg | ORAL_TABLET | Freq: Every evening | ORAL | Status: DC | PRN

## 2014-05-09 MED ORDER — LACTATED RINGERS IV SOLN
INTRAVENOUS | Status: DC | PRN
  Administered 2014-05-09 (×2): via INTRAVENOUS

## 2014-05-09 MED ORDER — LIDOCAINE HCL (CARDIAC) 20 MG/ML IV SOLN
INTRAVENOUS | Status: AC
  Filled 2014-05-09: qty 5

## 2014-05-09 MED ORDER — MIDAZOLAM HCL 5 MG/5ML IJ SOLN
INTRAMUSCULAR | Status: DC | PRN
  Administered 2014-05-09: 2 mg via INTRAVENOUS

## 2014-05-09 MED ORDER — OXYCODONE HCL 5 MG/5ML PO SOLN: 5.0000 mg | Freq: Once | ORAL | Status: DC | PRN

## 2014-05-09 MED ORDER — BISACODYL 10 MG RE SUPP
10.0000 mg | Freq: Every day | RECTAL | Status: DC | PRN
  Administered 2014-05-11: 10 mg via RECTAL
  Filled 2014-05-09: qty 1

## 2014-05-09 MED ORDER — PROMETHAZINE HCL 25 MG/ML IJ SOLN
6.2500 mg | Freq: Four times a day (QID) | INTRAMUSCULAR | Status: DC | PRN
  Administered 2014-05-09: 6.25 mg via INTRAVENOUS
  Filled 2014-05-09: qty 1

## 2014-05-09 MED ORDER — FENTANYL CITRATE 0.05 MG/ML IJ SOLN
INTRAMUSCULAR | Status: DC | PRN
  Administered 2014-05-09 (×6): 25 ug via INTRAVENOUS
  Administered 2014-05-09: 50 ug via INTRAVENOUS

## 2014-05-09 MED ORDER — SODIUM CHLORIDE 0.9 % IR SOLN
Status: DC | PRN
  Administered 2014-05-09: 1000 mL

## 2014-05-09 MED ORDER — ONDANSETRON HCL 4 MG/2ML IJ SOLN: 4.0000 mg | Freq: Four times a day (QID) | INTRAMUSCULAR | Status: DC | PRN

## 2014-05-09 MED ORDER — PHENOL 1.4 % MT LIQD: 1.0000 | OROMUCOSAL | Status: DC | PRN

## 2014-05-09 MED ORDER — CYANOCOBALAMIN 1000 MCG/ML IJ SOLN
1000.0000 ug | INTRAMUSCULAR | Status: DC
  Administered 2014-05-09: 1000 ug via INTRAMUSCULAR
  Filled 2014-05-09: qty 1

## 2014-05-09 MED ORDER — MAGNESIUM OXIDE 400 (241.3 MG) MG PO TABS
400.0000 mg | ORAL_TABLET | Freq: Every day | ORAL | Status: DC
  Administered 2014-05-09 – 2014-05-11 (×3): 400 mg via ORAL
  Filled 2014-05-09 (×4): qty 1

## 2014-05-09 MED ORDER — MAGNESIUM OXIDE 400 MG PO TABS: 400.0000 mg | ORAL_TABLET | Freq: Every day | ORAL | Status: DC

## 2014-05-09 MED ORDER — METHOCARBAMOL 500 MG PO TABS: 500.0000 mg | ORAL_TABLET | Freq: Three times a day (TID) | ORAL | Status: DC | PRN

## 2014-05-09 MED ORDER — MORPHINE SULFATE 2 MG/ML IJ SOLN
2.0000 mg | INTRAMUSCULAR | Status: DC | PRN
  Administered 2014-05-09 (×3): 2 mg via INTRAVENOUS
  Filled 2014-05-09 (×4): qty 1

## 2014-05-09 MED ORDER — PROPOFOL 10 MG/ML IV BOLUS
INTRAVENOUS | Status: DC | PRN
  Administered 2014-05-09: 140 mg via INTRAVENOUS

## 2014-05-09 MED ORDER — ASPIRIN 81 MG PO TABS: 81.0000 mg | ORAL_TABLET | Freq: Every day | ORAL | Status: DC

## 2014-05-09 MED ORDER — WARFARIN - PHARMACIST DOSING INPATIENT: Freq: Every day | Status: DC

## 2014-05-09 MED ORDER — LIDOCAINE HCL (CARDIAC) 20 MG/ML IV SOLN
INTRAVENOUS | Status: DC | PRN
  Administered 2014-05-09: 70 mg via INTRAVENOUS

## 2014-05-09 MED ORDER — METOCLOPRAMIDE HCL 10 MG PO TABS: 5.0000 mg | ORAL_TABLET | Freq: Three times a day (TID) | ORAL | Status: DC | PRN

## 2014-05-09 MED ORDER — OXYCODONE-ACETAMINOPHEN 5-325 MG PO TABS
1.0000 | ORAL_TABLET | ORAL | Status: DC | PRN
Start: 2014-05-09 — End: 2014-06-20

## 2014-05-09 MED ORDER — OXYCODONE HCL 5 MG PO TABS
5.0000 mg | ORAL_TABLET | ORAL | Status: DC | PRN
  Administered 2014-05-10: 10 mg via ORAL
  Administered 2014-05-10: 5 mg via ORAL
  Administered 2014-05-10 – 2014-05-11 (×3): 10 mg via ORAL
  Administered 2014-05-12: 5 mg via ORAL
  Filled 2014-05-09 (×2): qty 2
  Filled 2014-05-09: qty 1
  Filled 2014-05-09 (×4): qty 2

## 2014-05-09 MED ORDER — PANCRELIPASE (LIP-PROT-AMYL) 12000-38000 UNITS PO CPEP
24000.0000 [IU] | ORAL_CAPSULE | Freq: Three times a day (TID) | ORAL | Status: DC
  Administered 2014-05-10 – 2014-05-12 (×8): 24000 [IU] via ORAL
  Filled 2014-05-09 (×12): qty 2

## 2014-05-09 MED ORDER — VITAMIN D3 25 MCG (1000 UNIT) PO TABS
1000.0000 [IU] | ORAL_TABLET | Freq: Every day | ORAL | Status: DC
  Administered 2014-05-09 – 2014-05-12 (×4): 1000 [IU] via ORAL
  Filled 2014-05-09 (×4): qty 1

## 2014-05-09 MED ORDER — WARFARIN SODIUM 5 MG PO TABS: 5.0000 mg | ORAL_TABLET | Freq: Every day | ORAL | Status: DC

## 2014-05-09 MED ORDER — POLYVINYL ALCOHOL 1.4 % OP SOLN
1.0000 [drp] | OPHTHALMIC | Status: DC | PRN
  Filled 2014-05-09: qty 15

## 2014-05-09 MED ORDER — FLAXSEED OIL PO OIL: 1.0000 | TOPICAL_OIL | Freq: Two times a day (BID) | ORAL | Status: DC

## 2014-05-09 MED ORDER — TRAMADOL HCL 50 MG PO TABS
100.0000 mg | ORAL_TABLET | Freq: Every day | ORAL | Status: DC
  Administered 2014-05-09 – 2014-05-11 (×3): 100 mg via ORAL
  Filled 2014-05-09 (×3): qty 2

## 2014-05-09 MED ORDER — CEFAZOLIN SODIUM-DEXTROSE 2-3 GM-% IV SOLR
2.0000 g | Freq: Four times a day (QID) | INTRAVENOUS | Status: AC
  Administered 2014-05-09 (×2): 2 g via INTRAVENOUS
  Filled 2014-05-09 (×2): qty 50

## 2014-05-09 MED ORDER — ONDANSETRON HCL 4 MG PO TABS
4.0000 mg | ORAL_TABLET | Freq: Four times a day (QID) | ORAL | Status: DC | PRN
  Filled 2014-05-09: qty 1

## 2014-05-09 MED ORDER — POLYETHYL GLYCOL-PROPYL GLYCOL 0.4-0.3 % OP SOLN: 1.0000 [drp] | OPHTHALMIC | Status: DC | PRN

## 2014-05-09 MED ORDER — FENTANYL CITRATE 0.05 MG/ML IJ SOLN
INTRAMUSCULAR | Status: AC
  Filled 2014-05-09: qty 5

## 2014-05-09 MED ORDER — VITAMIN C 500 MG PO TABS
500.0000 mg | ORAL_TABLET | Freq: Every day | ORAL | Status: DC
  Administered 2014-05-09 – 2014-05-12 (×4): 500 mg via ORAL
  Filled 2014-05-09 (×4): qty 1

## 2014-05-09 MED ORDER — MENTHOL 3 MG MT LOZG: 1.0000 | LOZENGE | OROMUCOSAL | Status: DC | PRN

## 2014-05-09 MED ORDER — EPHEDRINE SULFATE 50 MG/ML IJ SOLN
INTRAMUSCULAR | Status: AC
  Filled 2014-05-09: qty 1

## 2014-05-09 MED ORDER — SODIUM CHLORIDE 0.9 % IR SOLN
Status: DC | PRN
  Administered 2014-05-09: 3000 mL

## 2014-05-09 MED ORDER — CHLORHEXIDINE GLUCONATE 4 % EX LIQD
60.0000 mL | Freq: Once | CUTANEOUS | Status: DC
  Filled 2014-05-09: qty 60

## 2014-05-09 MED ORDER — TOPIRAMATE 100 MG PO TABS
100.0000 mg | ORAL_TABLET | Freq: Two times a day (BID) | ORAL | Status: DC
  Administered 2014-05-09 – 2014-05-12 (×6): 100 mg via ORAL
  Filled 2014-05-09 (×7): qty 1

## 2014-05-09 MED ORDER — POLYETHYLENE GLYCOL 3350 17 G PO PACK: 17.0000 g | PACK | Freq: Every day | ORAL | Status: DC | PRN

## 2014-05-09 MED ORDER — PROPOFOL 10 MG/ML IV BOLUS
INTRAVENOUS | Status: AC
  Filled 2014-05-09: qty 20

## 2014-05-09 MED ORDER — WARFARIN VIDEO: Freq: Once | Status: DC

## 2014-05-09 MED ORDER — LEVOTHYROXINE SODIUM 100 MCG PO TABS
100.0000 ug | ORAL_TABLET | Freq: Every day | ORAL | Status: DC
  Administered 2014-05-10 – 2014-05-12 (×3): 100 ug via ORAL
  Filled 2014-05-09 (×4): qty 1

## 2014-05-09 MED ORDER — MIDAZOLAM HCL 2 MG/2ML IJ SOLN
INTRAMUSCULAR | Status: AC
  Filled 2014-05-09: qty 2

## 2014-05-09 MED ORDER — ASPIRIN 81 MG PO CHEW
81.0000 mg | CHEWABLE_TABLET | Freq: Every day | ORAL | Status: DC
  Administered 2014-05-10 – 2014-05-12 (×3): 81 mg via ORAL
  Filled 2014-05-09 (×4): qty 1

## 2014-05-09 MED ORDER — ONDANSETRON HCL 4 MG/2ML IJ SOLN
4.0000 mg | Freq: Four times a day (QID) | INTRAMUSCULAR | Status: DC | PRN
  Administered 2014-05-09: 4 mg via INTRAVENOUS
  Filled 2014-05-09: qty 2

## 2014-05-09 MED ORDER — SODIUM CHLORIDE 0.9 % IV SOLN
INTRAVENOUS | Status: DC
  Administered 2014-05-09 – 2014-05-10 (×2): via INTRAVENOUS

## 2014-05-09 MED ORDER — WARFARIN SODIUM 5 MG PO TABS
5.0000 mg | ORAL_TABLET | Freq: Once | ORAL | Status: AC
  Administered 2014-05-09: 5 mg via ORAL
  Filled 2014-05-09: qty 1

## 2014-05-09 MED ORDER — FERROUS SULFATE 325 (65 FE) MG PO TABS
325.0000 mg | ORAL_TABLET | Freq: Three times a day (TID) | ORAL | Status: DC
  Administered 2014-05-10 – 2014-05-12 (×8): 325 mg via ORAL
  Filled 2014-05-09 (×12): qty 1

## 2014-05-09 MED ORDER — METHOCARBAMOL 1000 MG/10ML IJ SOLN
500.0000 mg | Freq: Four times a day (QID) | INTRAMUSCULAR | Status: DC | PRN
  Filled 2014-05-09: qty 5

## 2014-05-09 MED ORDER — ACETAMINOPHEN 650 MG RE SUPP: 650.0000 mg | Freq: Four times a day (QID) | RECTAL | Status: DC | PRN

## 2014-05-09 SURGICAL SUPPLY — 57 items
BANDAGE ELASTIC 6 VELCRO ST LF (GAUZE/BANDAGES/DRESSINGS) ×1 IMPLANT
BANDAGE ESMARK 6X9 LF (GAUZE/BANDAGES/DRESSINGS) ×1 IMPLANT
BLADE SAG 18X100X1.27 (BLADE) ×2 IMPLANT
BLADE SAW SGTL 13.0X1.19X90.0M (BLADE) ×2 IMPLANT
BNDG CMPR 9X6 STRL LF SNTH (GAUZE/BANDAGES/DRESSINGS) ×1
BNDG CMPR MED 10X6 ELC LF (GAUZE/BANDAGES/DRESSINGS) ×1
BNDG ELASTIC 6X10 VLCR STRL LF (GAUZE/BANDAGES/DRESSINGS) ×2 IMPLANT
BNDG ESMARK 6X9 LF (GAUZE/BANDAGES/DRESSINGS) ×2
BNDG GAUZE ELAST 4 BULKY (GAUZE/BANDAGES/DRESSINGS) ×2 IMPLANT
BOWL SMART MIX CTS (DISPOSABLE) ×2 IMPLANT
CAPT RP KNEE ×1 IMPLANT
CEMENT HV SMART SET (Cement) ×4 IMPLANT
COVER SURGICAL LIGHT HANDLE (MISCELLANEOUS) ×2 IMPLANT
CUFF TOURNIQUET SINGLE 34IN LL (TOURNIQUET CUFF) ×1 IMPLANT
CUFF TOURNIQUET SINGLE 44IN (TOURNIQUET CUFF) IMPLANT
DRAPE EXTREMITY T 121X128X90 (DRAPE) ×2 IMPLANT
DRAPE INCISE IOBAN 66X45 STRL (DRAPES) ×1 IMPLANT
DRAPE PROXIMA HALF (DRAPES) ×2 IMPLANT
DRAPE U-SHAPE 47X51 STRL (DRAPES) ×2 IMPLANT
DRSG ADAPTIC 3X8 NADH LF (GAUZE/BANDAGES/DRESSINGS) ×2 IMPLANT
DRSG PAD ABDOMINAL 8X10 ST (GAUZE/BANDAGES/DRESSINGS) ×4 IMPLANT
DURAPREP 26ML APPLICATOR (WOUND CARE) ×3 IMPLANT
ELECT CAUTERY BLADE 6.4 (BLADE) ×2 IMPLANT
ELECT REM PT RETURN 9FT ADLT (ELECTROSURGICAL) ×2
ELECTRODE REM PT RTRN 9FT ADLT (ELECTROSURGICAL) ×1 IMPLANT
GAUZE SPONGE 4X4 12PLY STRL (GAUZE/BANDAGES/DRESSINGS) ×2 IMPLANT
GLOVE BIOGEL PI ORTHO PRO 7.5 (GLOVE) ×1
GLOVE BIOGEL PI ORTHO PRO SZ8 (GLOVE) ×1
GLOVE ORTHO TXT STRL SZ7.5 (GLOVE) ×2 IMPLANT
GLOVE PI ORTHO PRO STRL 7.5 (GLOVE) ×1 IMPLANT
GLOVE PI ORTHO PRO STRL SZ8 (GLOVE) ×1 IMPLANT
GLOVE SURG ORTHO 8.5 STRL (GLOVE) ×2 IMPLANT
GOWN STRL REUS W/ TWL XL LVL3 (GOWN DISPOSABLE) ×3 IMPLANT
GOWN STRL REUS W/TWL XL LVL3 (GOWN DISPOSABLE) ×6
HANDPIECE INTERPULSE COAX TIP (DISPOSABLE) ×2
IMMOBILIZER KNEE 22 UNIV (SOFTGOODS) ×1 IMPLANT
KIT BASIN OR (CUSTOM PROCEDURE TRAY) ×2 IMPLANT
KIT MANIFOLD (MISCELLANEOUS) ×2 IMPLANT
KIT ROOM TURNOVER OR (KITS) ×2 IMPLANT
MANIFOLD NEPTUNE II (INSTRUMENTS) ×2 IMPLANT
NS IRRIG 1000ML POUR BTL (IV SOLUTION) ×2 IMPLANT
PACK TOTAL JOINT (CUSTOM PROCEDURE TRAY) ×2 IMPLANT
PAD ARMBOARD 7.5X6 YLW CONV (MISCELLANEOUS) ×3 IMPLANT
SET HNDPC FAN SPRY TIP SCT (DISPOSABLE) ×1 IMPLANT
STRIP CLOSURE SKIN 1/2X4 (GAUZE/BANDAGES/DRESSINGS) ×4 IMPLANT
SUCTION FRAZIER TIP 10 FR DISP (SUCTIONS) ×2 IMPLANT
SUT MNCRL AB 3-0 PS2 18 (SUTURE) ×2 IMPLANT
SUT VIC AB 0 CT1 27 (SUTURE) ×4
SUT VIC AB 0 CT1 27XBRD ANBCTR (SUTURE) ×2 IMPLANT
SUT VIC AB 1 CT1 27 (SUTURE) ×6
SUT VIC AB 1 CT1 27XBRD ANBCTR (SUTURE) ×3 IMPLANT
SUT VIC AB 2-0 CT1 27 (SUTURE) ×4
SUT VIC AB 2-0 CT1 TAPERPNT 27 (SUTURE) ×2 IMPLANT
TOWEL OR 17X24 6PK STRL BLUE (TOWEL DISPOSABLE) ×2 IMPLANT
TOWEL OR 17X26 10 PK STRL BLUE (TOWEL DISPOSABLE) ×2 IMPLANT
TRAY FOLEY CATH 16FRSI W/METER (SET/KITS/TRAYS/PACK) ×1 IMPLANT
WATER STERILE IRR 1000ML POUR (IV SOLUTION) ×3 IMPLANT

## 2014-05-09 NOTE — Progress Notes (Signed)
ANTICOAGULATION CONSULT NOTE - Initial Consult  Pharmacy Consult for warfarin  Indication: VTE prophylaxis  Allergies  Allergen Reactions  . Metoclopramide Hcl Other (See Comments)    REGLAN="burning in mouth"  . Oxymetazoline Hcl Itching    AFRIN SPRAY  . Sucralfate Other (See Comments)    CARAFATE=Mouth sores    Patient Measurements:   Heparin Dosing Weight:   Vital Signs: Temp: 98.7 F (37.1 C) (10/09 1148) Temp Source: Oral (10/09 1148) BP: 117/64 mmHg (10/09 1148) Pulse Rate: 72 (10/09 1148)  Labs:  Recent Labs  05/09/14 1415  HGB 11.1*  HCT 33.9*  PLT 241  LABPROT 14.5  INR 1.13    The CrCl is unknown because both a height and weight (above a minimum accepted value) are required for this calculation.   Medical History: Past Medical History  Diagnosis Date  . Nonorganic sleep disorder, unspecified   . Fatigue   . Pulmonary sarcoidosis   . Hypothyroidism   . Polio 1948  . Vitamin D deficiency   . Vitamin B12 deficiency   . Osteoarthritis   . GERD (gastroesophageal reflux disease)   . Pancreatitis   . Personal history of colonic polyps   . Iron deficiency anemia, unspecified   . Lower esophageal ring 2008    EGD  . External hemorrhoids without mention of complication 6433    Colonoscopy   . Bursitis   . Sleep apnea     cpap since 09 [facility]  . Headache(784.0)     migraines  . Heat rash     under the breasts.Marland Kitchenappeared on monday.Marland KitchenMarland KitchenBurning & itching, uses cortisone    Medications:  Prescriptions prior to admission  Medication Sig Dispense Refill  . Alum Hydroxide-Mag Carbonate (GAVISCON PO) Take 1 tablet by mouth as needed (reflux). As directed as needed      . aspirin 81 MG tablet Take 81 mg by mouth daily.      . beclomethasone (QVAR) 40 MCG/ACT inhaler Inhale 2 puffs into the lungs 2 (two) times daily.      . Cholecalciferol (VITAMIN D PO) Take 1,000 Units by mouth daily.       . cyanocobalamin (,VITAMIN B-12,) 1000  MCG/ML injection Inject 1,000 mcg into the muscle every 14 (fourteen) days.      . Flaxseed, Linseed, (FLAXSEED OIL) OIL Take 1 capsule by mouth 2 (two) times daily.        . Glucosamine-Chondroit-Vit C-Mn (GLUCOSAMINE CHONDR 1500 COMPLX PO) Take 1 tablet by mouth 2 (two) times daily.        Marland Kitchen levothyroxine (SYNTHROID, LEVOTHROID) 100 MCG tablet Take 100 mcg by mouth daily before breakfast.      . magnesium oxide (MAG-OX) 400 MG tablet Take 400 mg by mouth at bedtime.      . Multiple Vitamins-Minerals (ICAPS AREDS FORMULA PO) Take 1 capsule by mouth daily.        Marland Kitchen omeprazole-sodium bicarbonate (ZEGERID) 40-1100 MG per capsule Take 1 capsule by mouth daily before breakfast.  30 capsule  2  . OVER THE COUNTER MEDICATION Take 5 tablets by mouth 2 (two) times daily. Supplement - [place]      . Pancrelipase, Lip-Prot-Amyl, (CREON) 24000 UNITS CPEP Take 1 capsule (24,000 Units total) by mouth 3 (three) times daily.  90 capsule  1  . Polyethyl Glycol-Propyl Glycol (SYSTANE ULTRA) 0.4-0.3 % SOLN Apply 1 drop to eye as needed (dry eyes).       . topiramate (  TOPAMAX) 100 MG tablet Take 100 mg by mouth 2 (two) times daily.      . traMADol (ULTRAM) 50 MG tablet Take 100 mg by mouth at bedtime.       . vitamin C (ASCORBIC ACID) 500 MG tablet Take 500 mg by mouth daily.        Marland Kitchen zolpidem (AMBIEN) 10 MG tablet Take 5 mg by mouth at bedtime as needed for sleep.         Assessment: 73 yo female s/p R TKA today.  To begin warfarin for post-op prophylaxis.  Baseline INR 1.1.  Hgb is moderately low, plts are wnl.  Pt will need counseling as this is a new medication for her.  Goal of Therapy:  INR 2-3 Monitor platelets by anticoagulation protocol: Yes    Plan:  Warfarin 5 mg po x1 Daily INR   Hughes Better, PharmD, BCPS Clinical Pharmacist Pager: [phone] 05/09/2014 3:11 PM

## 2014-05-09 NOTE — Progress Notes (Signed)
Utilization review completed.  

## 2014-05-09 NOTE — Evaluation (Signed)
Physical Therapy Evaluation Patient Details Name: Elizabeth Gray MRN: [id number] DOB: Nov 27, 1940 Today's Date: 05/09/2014   History of Present Illness  Pt is a 73 y.o. female s/p Rt TKA 05/09/14.  Clinical Impression  Pt is s/p Rt TKA POD#0 resulting in the deficits listed below (see PT Problem List).  Pt will benefit from skilled PT to increase their independence and safety with mobility to allow discharge to the venue listed below. Pt limited to SPT during evaluation due to n/v. Nurse aware. Pt hopeful to D/C home by Monday.     Follow Up Recommendations Home health PT;Supervision/Assistance - 24 hour    Equipment Recommendations  Rolling walker with 5" wheels;3in1 (PT)    Recommendations for Other Services OT consult     Precautions / Restrictions Precautions Precautions: Fall;Knee Precaution Comments: given HEP handout Required Braces or Orthoses: Knee Immobilizer - Right Knee Immobilizer - Right: On when out of bed or walking Restrictions Weight Bearing Restrictions: No RLE Weight Bearing: Weight bearing as tolerated      Mobility  Bed Mobility Overal bed mobility: Needs Assistance Bed Mobility: Supine to Sit     Supine to sit: Min assist;HOB elevated     General bed mobility comments: (A) to advance Rt LE; cues for hand placement   Transfers Overall transfer level: Needs assistance Equipment used: Rolling walker (2 wheeled) Transfers: Sit to/from [facility] Sit to Stand: Min assist Stand pivot transfers: Min assist       General transfer comment: (A) to power up and achieve standing due to decr ability to WB through Rt LE; (A) to manage RW and maintain balance with pivotal steps to chair   Ambulation/Gait             General Gait Details: 3-4 pivotal steps only due to nausea   Stairs            Wheelchair Mobility    Modified Rankin (Stroke Patients Only)       Balance Overall balance assessment: Needs  assistance Sitting-balance support: Feet supported;No upper extremity supported Sitting balance-Leahy Scale: Fair Sitting balance - Comments: pt bracing due to nausea and "little dizziness"   Standing balance support: During functional activity;Bilateral upper extremity supported Standing balance-Leahy Scale: Poor Standing balance comment: relies on RW for balance                             Pertinent Vitals/Pain Pain Assessment: 0-10 Pain Score: 3  Pain Location: Rt knee Pain Descriptors / Indicators: Aching Pain Intervention(s): Monitored during session;Premedicated before session;Repositioned    Home Living Family/patient expects to be discharged to:: Private residence Living Arrangements: Spouse/significant other Available Help at Discharge: Family;Available 24 hours/day Type of Home: House Home Access: Stairs to enter Entrance Stairs-Rails: None Entrance Stairs-Number of Steps: 2 Home Layout: One level Home Equipment: None Additional Comments: pt has tub shower    Prior Function Level of Independence: Independent               Hand Dominance        Extremity/Trunk Assessment   Upper Extremity Assessment: Defer to OT evaluation           Lower Extremity Assessment: RLE deficits/detail RLE Deficits / Details: limited by pain; diminished sensation to light touch    Cervical / Trunk Assessment: Normal  Communication   Communication: No difficulties  Cognition Arousal/Alertness: Lethargic;Suspect due to medications Behavior During Therapy: [facility] for tasks  assessed/performed Overall Cognitive Status: Within Functional Limits for tasks assessed                      General Comments General comments (skin integrity, edema, etc.): educated on no pillows under knee     Exercises Total Joint Exercises Ankle Circles/Pumps: AROM;Both;10 reps;Seated      Assessment/Plan    PT Assessment Patient needs continued PT services  PT  Diagnosis Difficulty walking;Generalized weakness;Acute pain   PT Problem List Decreased strength;Decreased activity tolerance;Decreased balance;Decreased range of motion;Decreased mobility;Decreased knowledge of use of DME;Pain  PT Treatment Interventions DME instruction;Gait training;Stair training;Functional mobility training;Therapeutic activities;Therapeutic exercise;Balance training;Neuromuscular re-education;Patient/family education   PT Goals (Current goals can be found in the Care Plan section) Acute Rehab PT Goals Patient Stated Goal: to go home monday PT Goal Formulation: With patient Time For Goal Achievement: 05/16/14 Potential to Achieve Goals: Good    Frequency 7X/week   Barriers to discharge        Co-evaluation               End of Session Equipment Utilized During Treatment: Gait belt;Right knee immobilizer Activity Tolerance: Other (comment) (limited by nausea) Patient left: in chair;with call bell/phone within reach;with nursing/sitter in room;with family/visitor present Nurse Communication: Mobility status;Other (comment) (pt n/v)         Time: 2423-5361 PT Time Calculation (min): 24 min   Charges:   PT Evaluation $Initial PT Evaluation Tier I: 1 Procedure PT Treatments $Therapeutic Activity: 8-22 mins   PT G CodesGustavus Bryant, [place]  [phone] 05/09/2014, 4:28 PM

## 2014-05-09 NOTE — Op Note (Signed)
NAMEOSCEOLA, HOLIAN             ACCOUNT NO.:  [id number]  MEDICAL RECORD NO.:  [id number]  LOCATION:  [facility]                         FACILITY:  [facility]  PHYSICIAN:  Doran Heater. [name], M.D. DATE OF BIRTH:  1941/04/03  DATE OF PROCEDURE:  05/09/2014 DATE OF DISCHARGE:                              OPERATIVE REPORT   PREOPERATIVE DIAGNOSIS:  Right knee end-stage osteoarthritis.  POSTOPERATIVE DIAGNOSIS:  Right knee end-stage osteoarthritis.  PROCEDURE PERFORMED:  Right total knee replacement using DePuy Sigma rotating platform prosthesis.  ATTENDING SURGEON:  Doran Heater. [name], MD.  ASSISTANT:  Charletta Cousin Dixon, [place], who was scrubbed during the entire procedure and necessary for satisfactory completion of surgery.  ANESTHESIA:  General anesthesia was used plus femoral block.  ESTIMATED BLOOD LOSS:  Minimal.  FLUID REPLACEMENT:  1500 mL of crystalloid.  INSTRUMENT COUNTS:  Correct.  There were no complications.  Perioperative antibiotics were given.  TOURNIQUET TIME:  An hour and a half.  Tourniquet was at 300 mmHg.  INDICATIONS:  The patient is a 73 year old female with end-stage arthritis of the right knee.  She has bone-on-bone changes on x-ray and has failed a long period of conservative management.  Currently, has pain at rest, pain and night, has to stop after a block to sit down to rest her knee.  She does utilize assistive devices for ambulation. Having failed conservative management, the patient desires total knee arthroplasty to restore function and relieve pain in her knee.  Informed consent obtained.  DESCRIPTION OF PROCEDURE:  After an adequate level of anesthesia was achieved, the patient was positioned supine on the operating room table. Right leg correctly identified.  Time-out called.  Sterile prep and drape of the knee performed.  The knee was elevated, exsanguinated, and the tourniquet elevated to 300 mmHg.  Knee was then flexed and a midline incision  was created in the knee in flexion.  Dissection down through the subcutaneous tissues.  We identified the median parapatellar tissues and divided that sharply using a fresh blade.  Median parapatellar arthrotomy created.  Lateral patellofemoral ligaments divided.  Distal femur entered using a step-cut drill.  Intramedullary guide placed, set on 5 degrees right, and 10 mm resection.  Using an oscillating saw to cut the distal femur, we then sized the femur anterior down to a size 2.5 and then performed our anterior, posterior, and chamfer cuts with a 4-in-1 block.  We resected ACL, PCL, and meniscal tissues.  Subluxed the tibia anteriorly and then went ahead and cut our tibia perpendicular to the long axis of the tibia with minimal posterior slope as posterior cruciate substituting prosthesis.  Once we had our cut down, we sized our gaps and they were symmetric at 10 mm.  We then finished our tibial preparation with the modular drill and keel punch placing the trial tibia in place size 3 and then went ahead and did our box cut.  With the box cut guide on the femur, we then placed our 2.5 right trial and then a 12.5 poly insert.  We were able to get nice extension with that and excellent soft tissue balancing.  We then resurfaced the patella, starting at 24 thickness going down  to 16 thickness and then drilling the lug holes for a 38 patella.  We placed the patellar trial in place and then ranged the knee with a no-touch technique, had excellent stability and normal patellar tracking.  We thoroughly irrigated the knee, removed the trial components, and then cemented the components into place using vacuum mixing and DePuy SmartSet cement.  Once the cement was in place and the components in proper position that was a size 3 tibia, 2.5 right femur, and then a 12.5 trial insert.  We placed the knee in extension.  We cemented the patellar component in place and used a patellar clamp to hold that  until all cement was hardened.  We removed excess cement, quarter-inch curved osteotome.  We then trialed a size 15 and we were able to get that snapped into place.  This gave a little bit better flexion and stability and we were still able to achieve full extension.  We then selected a real 15 poly insert and traced that out for the trial.  Irrigated thoroughly.  Inspected all areas of the knee for any bony debris or cement and then did our final reduction.  We were happy with soft tissue balancing, patellar tracking, and stability.  We then thoroughly irrigated again with pulse irrigator and then closed the parapatellar arthrotomy with interrupted #1 Vicryl suture followed by 2-0 Vicryl subcutaneous closure and 4-0 Monocryl for skin.  Steri-Strips applied followed by sterile dressing.  The patient tolerated the surgery well.     Doran Heater. [name], M.D.     SRN/MEDQ  D:  05/09/2014  T:  05/09/2014  Job:  [id number]

## 2014-05-09 NOTE — Anesthesia Procedure Notes (Addendum)
Procedure Name: LMA Insertion Date/Time: 05/09/2014 7:36 AM Performed by: Julian Reil Pre-anesthesia Checklist: Patient identified, Emergency Drugs available, Suction available and Patient being monitored Patient Re-evaluated:Patient Re-evaluated prior to inductionOxygen Delivery Method: Circle system utilized Preoxygenation: Pre-oxygenation with 100% oxygen Intubation Type: IV induction Ventilation: Mask ventilation without difficulty LMA: LMA inserted LMA Size: 4.0 Tube type: Oral Number of attempts: 1 Placement Confirmation: positive ETCO2 and breath sounds checked- equal and bilateral Tube secured with: Tape Dental Injury: Teeth and Oropharynx as per pre-operative assessment    Anesthesia Regional Block:  Femoral nerve block  Pre-Anesthetic Checklist: ,, timeout performed, Correct Patient, Correct Site, Correct Laterality, Correct Procedure,, site marked, risks and benefits discussed, Surgical consent,  Pre-op evaluation,  At surgeon's request and post-op pain management  Laterality: Right  Prep: chloraprep       Needles:  Injection technique: Single-shot  Needle Type: Echogenic Stimulator Needle     Needle Length: 9cm 9 cm Needle Gauge: 21 and 21 G    Additional Needles:  Procedures: nerve stimulator Femoral nerve block  Nerve Stimulator or Paresthesia:  Response: Quadriceps muscle contraction, 0.45 mA,   Additional Responses:   Narrative:  Start time: 05/09/2014 7:02 AM End time: 05/09/2014 7:16 AM Injection made incrementally with aspirations every 5 mL.  Performed by: Personally  Anesthesiologist: Dr Marcie Bal  Additional Notes: Functioning IV was confirmed and monitors were applied.  A 76mm 21ga Arrow echogenic stimulator needle was used. Sterile prep and drape,hand hygiene and sterile gloves were used.  Negative aspiration and negative test dose prior to incremental administration of local anesthetic. The patient tolerated the procedure  well.

## 2014-05-09 NOTE — Care Management Note (Signed)
CARE MANAGEMENT NOTE 05/09/2014  Patient:  Elizabeth, Gray   Account Number:  [id number]  Date Initiated:  05/09/2014  Documentation initiated by:  Ricki Ayler  Subjective/Objective Assessment:   73 yr old female s/p right total knee arthroplasty.     Action/Plan:   Patient preoperatively setup with [facility].  PT/OT eval   Anticipated DC Date:     Anticipated DC Plan:  [place]  CM consult      Choice offered to / List presented to:     DME arranged  3-N-1  Linda  CPM      DME agency  [employer]     [place] arranged  HH-2 PT      [facility] agency  [facility]   Status of service:  In process, will continue to follow Medicare Important Message given?   (If response is "NO", the following Medicare IM given date fields will be blank) Date Medicare IM given:   Medicare IM given by:   Date Additional Medicare IM given:   Additional Medicare IM given by:    Discharge Disposition:    Per UR Regulation:  Reviewed for med. necessity/level of care/duration of stay

## 2014-05-09 NOTE — Transfer of Care (Signed)
Immediate Anesthesia Transfer of Care Note  Patient: Elizabeth Gray  Procedure(s) Performed: Procedure(s): RIGHT TOTAL KNEE ARTHROPLASTY (Right)  Patient Location: PACU  Anesthesia Type:GA combined with regional for post-op pain  Level of Consciousness: awake, alert , oriented and patient cooperative  Airway & Oxygen Therapy: Patient Spontanous Breathing and Patient connected to nasal cannula oxygen  Post-op Assessment: Report given to PACU RN, Post -op Vital signs reviewed and stable and Patient moving all extremities  Post vital signs: Reviewed and stable  Complications: No apparent anesthesia complications

## 2014-05-09 NOTE — Progress Notes (Signed)
Orthopedic Tech Progress Note Patient Details:  Elizabeth Gray [birth year] [id number] Start time for cpm is 4:30n pm Patient ID: [name], female   DOB: [birth year], 73 y.o.   MRN: [id number]   Braulio Bosch 05/09/2014, 4:37 PM

## 2014-05-09 NOTE — Anesthesia Postprocedure Evaluation (Signed)
Anesthesia Post Note  Patient: Elizabeth Gray  Procedure(s) Performed: Procedure(s) (LRB): RIGHT TOTAL KNEE ARTHROPLASTY (Right)  Anesthesia type: General  Patient location: PACU  Post pain: Pain level controlled and Adequate analgesia  Post assessment: Post-op Vital signs reviewed, Patient's Cardiovascular Status Stable, Respiratory Function Stable, Patent Airway and Pain level controlled  Last Vitals:  Filed Vitals:   05/09/14 1130  BP:   Pulse: 78  Temp:   Resp: 11    Post vital signs: Reviewed and stable  Level of consciousness: awake, alert  and oriented  Complications: No apparent anesthesia complications

## 2014-05-09 NOTE — Anesthesia Preprocedure Evaluation (Signed)
Anesthesia Evaluation  Patient identified by MRN, date of birth, ID band Patient awake    Reviewed: Allergy & Precautions, H&P , NPO status , Patient's Chart, lab work & pertinent test results  Airway Mallampati: II  Neck ROM: full    Dental   Pulmonary sleep apnea , former smoker,          Cardiovascular negative cardio ROS      Neuro/Psych  Headaches,    GI/Hepatic GERD-  ,  Endo/Other  Hypothyroidism   Renal/GU      Musculoskeletal  (+) Arthritis -,   Abdominal   Peds  Hematology   Anesthesia Other Findings   Reproductive/Obstetrics                           Anesthesia Physical Anesthesia Plan  ASA: II  Anesthesia Plan: General and Regional   Post-op Pain Management: MAC Combined w/ Regional for Post-op pain   Induction: Intravenous  Airway Management Planned: LMA  Additional Equipment:   Intra-op Plan:   Post-operative Plan:   Informed Consent: I have reviewed the patients History and Physical, chart, labs and discussed the procedure including the risks, benefits and alternatives for the proposed anesthesia with the patient or authorized representative who has indicated his/her understanding and acceptance.     Plan Discussed with: CRNA, Anesthesiologist and Surgeon  Anesthesia Plan Comments:         Anesthesia Quick Evaluation

## 2014-05-09 NOTE — Progress Notes (Signed)
Orthopedic Tech Progress Note Patient Details:  Elizabeth Gray [birth year] [id number]  CPM Right Knee CPM Right Knee: On Right Knee Flexion (Degrees): 60 Right Knee Extension (Degrees): 0 Additional Comments: applied ohf to bed zero knee at bedside  Ortho Devices Ortho Device/Splint Location: footsie roll Ortho Device/Splint Interventions: Ordered   Braulio Bosch 05/09/2014, 4:36 PM

## 2014-05-09 NOTE — Brief Op Note (Signed)
05/09/2014  9:29 AM  PATIENT:  Elizabeth Gray  73 y.o. female  PRE-OPERATIVE DIAGNOSIS:  RIGHT KNEE OSTEOARTHRITIS, END STAGE  POST-OPERATIVE DIAGNOSIS:  RIGHT KNEE OSTEOARTHRITIS, END STAGE  PROCEDURE:  Procedure(s): RIGHT TOTAL KNEE ARTHROPLASTY (Right), DePuy Sigma RP  SURGEON:  Surgeon(s) and Role:    * [name], MD - Primary  PHYSICIAN ASSISTANT:   ASSISTANTS: Ventura Bruns, PA-C   ANESTHESIA:   regional and general  EBL:  Total I/O In: 1700 [I.V.:1700] Out: 60 [Urine:60]  BLOOD ADMINISTERED:none  DRAINS: none   LOCAL MEDICATIONS USED:  MARCAINE     SPECIMEN:  No Specimen  DISPOSITION OF SPECIMEN:  N/A  COUNTS:  YES  TOURNIQUET:   Total Tourniquet Time Documented: Thigh (Right) - 89 minutes Total: Thigh (Right) - 89 minutes   DICTATION: .Other Dictation: Dictation Number [id number]  PLAN OF CARE: Admit to inpatient   PATIENT DISPOSITION:  PACU - hemodynamically stable.   Delay start of Pharmacological VTE agent (>24hrs) due to surgical blood loss or risk of bleeding: no

## 2014-05-09 NOTE — Interval H&P Note (Signed)
History and Physical Interval Note:  05/09/2014 7:21 AM  Elizabeth Gray  has presented today for surgery, with the diagnosis of RIGHT KNEE OSTEOARTHRITIS  The various methods of treatment have been discussed with the patient and family. After consideration of risks, benefits and other options for treatment, the patient has consented to  Procedure(s): RIGHT TOTAL KNEE ARTHROPLASTY (Right) as a surgical intervention .  The patient's history has been reviewed, patient examined, no change in status, stable for surgery.  I have reviewed the patient's chart and labs.  Questions were answered to the patient's satisfaction.     Raywood Wailes,STEVEN R

## 2014-05-10 LAB — CBC
HCT: 31.4 % — ABNORMAL LOW (ref 36.0–46.0)
Hemoglobin: 10.4 g/dL — ABNORMAL LOW (ref 12.0–15.0)
MCH: 29.7 pg (ref 26.0–34.0)
MCHC: 33.1 g/dL (ref 30.0–36.0)
MCV: 89.7 fL (ref 78.0–100.0)
PLATELETS: 236 10*3/uL (ref 150–400)
RBC: 3.5 MIL/uL — AB (ref 3.87–5.11)
RDW: 13.6 % (ref 11.5–15.5)
WBC: 10.7 10*3/uL — ABNORMAL HIGH (ref 4.0–10.5)

## 2014-05-10 LAB — PROTIME-INR
INR: 1.26 (ref 0.00–1.49)
PROTHROMBIN TIME: 15.8 s — AB (ref 11.6–15.2)

## 2014-05-10 LAB — BASIC METABOLIC PANEL
ANION GAP: 11 (ref 5–15)
BUN: 10 mg/dL (ref 6–23)
CO2: 23 mEq/L (ref 19–32)
Calcium: 8 mg/dL — ABNORMAL LOW (ref 8.4–10.5)
Chloride: 106 mEq/L (ref 96–112)
Creatinine, Ser: 0.7 mg/dL (ref 0.50–1.10)
GFR calc Af Amer: >90 mL/min (ref >90)
GFR calc non Af Amer: 83 mL/min — ABNORMAL LOW (ref >90)
Glucose, Bld: 110 mg/dL — ABNORMAL HIGH (ref 70–99)
Potassium: 3.7 mEq/L (ref 3.7–5.3)
SODIUM: 140 meq/L (ref 137–147)

## 2014-05-10 MED ORDER — WARFARIN SODIUM 5 MG PO TABS
5.0000 mg | ORAL_TABLET | Freq: Once | ORAL | Status: AC
  Administered 2014-05-10: 5 mg via ORAL
  Filled 2014-05-10: qty 1

## 2014-05-10 MED ORDER — CEFAZOLIN SODIUM 1-5 GM-% IV SOLN
1.0000 g | Freq: Three times a day (TID) | INTRAVENOUS | Status: AC
  Administered 2014-05-10 (×3): 1 g via INTRAVENOUS
  Filled 2014-05-10 (×4): qty 50

## 2014-05-10 NOTE — Progress Notes (Signed)
Orthopedics Progress Note  Subjective: It's a little sore  Objective:  Filed Vitals:   05/10/14 0527  BP: 128/52  Pulse: 79  Temp: 99.9 F (37.7 C)  Resp: 14    General: Awake and alert  Musculoskeletal: right knee immobilized in extension. Calf pumps with no pain Neurovascularly intact  Lab Results  Component Value Date   WBC 10.7* 05/10/2014   HGB 10.4* 05/10/2014   HCT 31.4* 05/10/2014   MCV 89.7 05/10/2014   PLT 236 05/10/2014       Component Value Date/Time   NA 140 05/10/2014 0402   K 3.7 05/10/2014 0402   CL 106 05/10/2014 0402   CO2 23 05/10/2014 0402   GLUCOSE 110* 05/10/2014 0402   BUN 10 05/10/2014 0402   CREATININE 0.70 05/10/2014 0402   CALCIUM 8.0* 05/10/2014 0402   GFRNONAA 83* 05/10/2014 0402   GFRAA >90 05/10/2014 0402    Lab Results  Component Value Date   INR 1.26 05/10/2014   INR 1.13 05/09/2014   INR 1.03 04/28/2014    Assessment/Plan: POD #1 s/p Procedure(s): RIGHT TOTAL KNEE ARTHROPLASTY Patient doing very well today.  OOB, mobilzation WBAT DVT prophylaxis with Coumadin and mechanical D/C planning, rehab near [place], [place]. [name], MD 05/10/2014 7:02 AM

## 2014-05-10 NOTE — Progress Notes (Signed)
Physical Therapy Treatment Patient Details Name: Elizabeth Gray MRN: [id number] DOB: 10-19-1940 Today's Date: 05/10/2014    History of Present Illness Pt is a 73 y.o. female s/p Rt TKA 05/09/14.    PT Comments    Pt progressing well with mobility. Pt very pleasant and motivated. Cont to follow per POC.   Follow Up Recommendations  Home health PT;Supervision/Assistance - 24 hour     Equipment Recommendations  Rolling walker with 5" wheels;3in1 (PT)    Recommendations for Other Services OT consult     Precautions / Restrictions Precautions Precautions: Fall;Knee Precaution Comments: Reviewed precautions Required Braces or Orthoses: Knee Immobilizer - Right Knee Immobilizer - Right: On when out of bed or walking Restrictions Weight Bearing Restrictions: Yes RLE Weight Bearing: Weight bearing as tolerated    Mobility  Bed Mobility Overal bed mobility: Needs Assistance Bed Mobility: Supine to Sit     Supine to sit: Supervision;HOB elevated     General bed mobility comments: cues for sequencing and relies on handrail  Transfers Overall transfer level: Needs assistance Equipment used: Rolling walker (2 wheeled) Transfers: Sit to/from Stand Sit to Stand: Min guard         General transfer comment: min guard to steady and cues for hand placement and sequencing   Ambulation/Gait Ambulation/Gait assistance: Min guard Ambulation Distance (Feet): 80 Feet Assistive device: Rolling walker (2 wheeled) Gait Pattern/deviations: Step-through pattern;Decreased stance time - right;Decreased step length - left;Antalgic Gait velocity: decreased Gait velocity interpretation: Below normal speed for age/gender General Gait Details: cues for increased stride length and equal step through gt; min guard to steady   Science writer    Modified Rankin (Stroke Patients Only)       Balance Overall balance assessment: Needs  assistance Sitting-balance support: Feet supported;No upper extremity supported Sitting balance-Leahy Scale: Good     Standing balance support: During functional activity;Bilateral upper extremity supported Standing balance-Leahy Scale: Poor Standing balance comment: relies on RW for support/balance                    Cognition Arousal/Alertness: Awake/alert Behavior During Therapy: WFL for tasks assessed/performed Overall Cognitive Status: Within Functional Limits for tasks assessed                      Exercises Total Joint Exercises Ankle Circles/Pumps: AROM;Both;10 reps;Seated Quad Sets: AROM;Both;10 reps;Seated Long Arc Quad: AROM;Right;10 reps;Seated Knee Flexion: AAROM;Right;10 reps;Other (comment) (AAROM with Lt LE ) Goniometric ROM: AROM in sitting -5 to 50 degrees; limited by pain and ACE wrap    General Comments General comments (skin integrity, edema, etc.): encouraged OOB with nursing for bathroom and for all meals       Pertinent Vitals/Pain Pain Assessment: 0-10 Pain Score: 5  Pain Location: inferior portion of patella on Rt Pain Descriptors / Indicators:  ("cut" feeling) Pain Intervention(s): Repositioned;Ice applied;Monitored during session;Premedicated before session    Home Living                      Prior Function            PT Goals (current goals can now be found in the care plan section) Acute Rehab PT Goals Patient Stated Goal: to go home monday PT Goal Formulation: With patient Time For Goal Achievement: 05/16/14 Potential to Achieve Goals: Good Progress towards PT goals: Progressing toward goals    Frequency  7X/week    PT Plan Current plan remains appropriate    Co-evaluation             End of Session Equipment Utilized During Treatment: Gait belt;Right knee immobilizer Activity Tolerance: Patient tolerated treatment well Patient left: in chair;with call bell/phone within reach     Time:  0758-0823 PT Time Calculation (min): 25 min  Charges:  $Gait Training: 8-22 mins $Therapeutic Exercise: 8-22 mins                    G Codes:      Gustavus Bryant, [address] 05/10/2014, 10:05 AM

## 2014-05-10 NOTE — Plan of Care (Signed)
Problem: Consults Goal: Diagnosis- Total Joint Replacement Outcome: Completed/Met Date Met:  05/10/14 Primary Total Knee Right     

## 2014-05-10 NOTE — Progress Notes (Addendum)
Occupational Therapy Treatment Patient Details Name: KRISTINA MCNORTON MRN: [id number] DOB: Jan 17, 1941 Today's Date: 05/10/2014    History of present illness Pt is a 73 y.o. female s/p Rt TKA 05/09/14.   OT comments  Pt moving well. Education provided and pt will have family available to assist her at home. OT signing off.   Follow Up Recommendations  No OT follow up;Supervision - Intermittent    Equipment Recommendations  3 in 1 bedside comode    Recommendations for Other Services      Precautions / Restrictions Precautions Precautions: Fall;Knee Precaution Comments: Reviewed precautions Required Braces or Orthoses: Knee Immobilizer - Right Knee Immobilizer - Right: On when out of bed or walking Restrictions Weight Bearing Restrictions: Yes RLE Weight Bearing: Weight bearing as tolerated       Mobility Bed Mobility Overal bed mobility: Needs Assistance Bed Mobility: Sit to Supine       Sit to supine: Supervision   General bed mobility comments: no physical assist needed.  Transfers Overall transfer level: Needs assistance Equipment used: Rolling walker (2 wheeled) Transfers: Sit to/from Stand Sit to Stand: Min guard;Min assist         General transfer comment: cues for hand placement. Min A for sit to stand from shower chair.    Balance                                   ADL Overall ADL's : Needs assistance/impaired                     Lower Body Dressing: Supervision/safety;Sitting/lateral leans (sock)   Toilet Transfer: Supervision/safety;Ambulation;RW (chair)       Tub/ Shower Transfer: Minimal assistance;Ambulation;Shower seat;Rolling walker   Functional mobility during ADLs: Supervision/safety;Rolling walker General ADL Comments: Practiced tub transfer (backing to shower chair) and explained options for tub transfer. Recommended not stepping over tub anytime soon and to have spouse with her for tub transfer. Able to reach  to don/doff right sock. Reviewed safety and precautions with pt.  mentioned there is equipment available if LB ADLs become issue.       Vision                     Perception     Praxis      Cognition   Behavior During Therapy: WFL for tasks assessed/performed Overall Cognitive Status: Within Functional Limits for tasks assessed                       Extremity/Trunk Assessment               Exercises     Shoulder Instructions       General Comments      Pertinent Vitals/ Pain       Pain Assessment: 0-10 Pain Score: 6  Pain Location: right knee Pain Intervention(s): Repositioned  Home Living                                          Prior Functioning/Environment              Frequency Min 2X/week     Progress Toward Goals  OT Goals(current goals can now be found in the care plan section)  Progress towards OT goals: Progressing toward  goals  Acute Rehab OT Goals Patient Stated Goal: not stated OT Goal Formulation: With patient Time For Goal Achievement: 05/17/14 Potential to Achieve Goals: Good ADL Goals Pt Will Perform Lower Body Dressing: with modified independence;sit to/from stand Pt Will Transfer to Toilet: with modified independence;ambulating (3 in 1 over commode) Pt Will Perform Tub/Shower Transfer: Tub transfer;with supervision;ambulating;rolling walker  Plan Discharge plan remains appropriate    Co-evaluation                 End of Session Equipment Utilized During Treatment: Rolling walker;Right knee immobilizer;Gait belt   Activity Tolerance Patient tolerated treatment well   Patient Left in bed;with call bell/phone within reach   Nurse Communication          Time: 6948-5462 OT Time Calculation (min): 13 min  Charges: OT General Charges $OT Visit: 1 Procedure OT Treatments $Self Care/Home Management : 8-22 mins  Benito Mccreedy OTR/L [phone] 05/10/2014, 5:54 PM

## 2014-05-10 NOTE — Progress Notes (Signed)
ANTICOAGULATION CONSULT NOTE - f/u  Pharmacy Consult for warfarin  Indication: VTE prophylaxis  Allergies  Allergen Reactions  . Metoclopramide Hcl Other (See Comments)    REGLAN="burning in mouth"  . Oxymetazoline Hcl Itching    AFRIN SPRAY  . Sucralfate Other (See Comments)    CARAFATE=Mouth sores    Vital Signs: Temp: 99.9 F (37.7 C) (10/10 0527) BP: 128/52 mmHg (10/10 0527) Pulse Rate: 79 (10/10 0527)  Labs:  Recent Labs  05/09/14 1415 05/10/14 0402  HGB 11.1* 10.4*  HCT 33.9* 31.4*  PLT 241 236  LABPROT 14.5 15.8*  INR 1.13 1.26  CREATININE  --  0.70    The CrCl is unknown because both a height and weight (above a minimum accepted value) are required for this calculation.   Medical History: Past Medical History  Diagnosis Date  . Nonorganic sleep disorder, unspecified   . Fatigue   . Pulmonary sarcoidosis   . Hypothyroidism   . Polio 1948  . Vitamin D deficiency   . Vitamin B12 deficiency   . Osteoarthritis   . GERD (gastroesophageal reflux disease)   . Pancreatitis   . Personal history of colonic polyps   . Iron deficiency anemia, unspecified   . Lower esophageal ring 2008    EGD  . External hemorrhoids without mention of complication 9242    Colonoscopy   . Bursitis   . Sleep apnea     cpap since 09 [facility]  . Headache(784.0)     migraines  . Heat rash     under the breasts.Marland Kitchenappeared on monday.Marland KitchenMarland KitchenBurning & itching, uses cortisone    Medications:  Prescriptions prior to admission  Medication Sig Dispense Refill  . Alum Hydroxide-Mag Carbonate (GAVISCON PO) Take 1 tablet by mouth as needed (reflux). As directed as needed      . aspirin 81 MG tablet Take 81 mg by mouth daily.      . beclomethasone (QVAR) 40 MCG/ACT inhaler Inhale 2 puffs into the lungs 2 (two) times daily.      . Cholecalciferol (VITAMIN D PO) Take 1,000 Units by mouth daily.       . cyanocobalamin (,VITAMIN B-12,) 1000 MCG/ML injection Inject 1,000  mcg into the muscle every 14 (fourteen) days.      . Flaxseed, Linseed, (FLAXSEED OIL) OIL Take 1 capsule by mouth 2 (two) times daily.        . Glucosamine-Chondroit-Vit C-Mn (GLUCOSAMINE CHONDR 1500 COMPLX PO) Take 1 tablet by mouth 2 (two) times daily.        Marland Kitchen levothyroxine (SYNTHROID, LEVOTHROID) 100 MCG tablet Take 100 mcg by mouth daily before breakfast.      . magnesium oxide (MAG-OX) 400 MG tablet Take 400 mg by mouth at bedtime.      . Multiple Vitamins-Minerals (ICAPS AREDS FORMULA PO) Take 1 capsule by mouth daily.        Marland Kitchen omeprazole-sodium bicarbonate (ZEGERID) 40-1100 MG per capsule Take 1 capsule by mouth daily before breakfast.  30 capsule  2  . OVER THE COUNTER MEDICATION Take 5 tablets by mouth 2 (two) times daily. Supplement - [place]      . Pancrelipase, Lip-Prot-Amyl, (CREON) 24000 UNITS CPEP Take 1 capsule (24,000 Units total) by mouth 3 (three) times daily.  90 capsule  1  . Polyethyl Glycol-Propyl Glycol (SYSTANE ULTRA) 0.4-0.3 % SOLN Apply 1 drop to eye as needed (dry eyes).       . topiramate (TOPAMAX) 100  MG tablet Take 100 mg by mouth 2 (two) times daily.      . traMADol (ULTRAM) 50 MG tablet Take 100 mg by mouth at bedtime.       . vitamin C (ASCORBIC ACID) 500 MG tablet Take 500 mg by mouth daily.        Marland Kitchen zolpidem (AMBIEN) 10 MG tablet Take 5 mg by mouth at bedtime as needed for sleep.         Assessment: 73 yo female s/p R TKA (POD1).  Continuing on warfarin for post-op prophylaxis. INR increased 1.13>>1.26, still subtherapeutic.  Hgb is moderately low stable, plts wnl. No bleeding documented Pt will need counseling as this is a new medication for her.  Goal of Therapy:  INR 2-3 Monitor platelets by anticoagulation protocol: Yes    Plan:  Warfarin 5 mg po x1 Daily PT/INR Monitor s/sx bleeding  Elicia Lamp, PharmD Clinical Pharmacist - Resident Pager [phone] 05/10/2014 12:37 PM

## 2014-05-10 NOTE — Progress Notes (Signed)
CSW (Clinical Education officer, museum) aware of consult. At this time, PT is recommending [facility] services. Pt has no social work needs. Please reconsult should needs arise.  [place], Katie

## 2014-05-10 NOTE — Evaluation (Addendum)
Occupational Therapy Evaluation Patient Details Name: Elizabeth Gray MRN: [id number] DOB: March 03, 1941 Today's Date: 05/10/2014    History of Present Illness Pt is a 73 y.o. female s/p Rt TKA 05/09/14.   Clinical Impression   Pt s/p above. Pt independent with ADLs, PTA. Feel pt will benefit from acute OT to increase independence prior to d/c.     Follow Up Recommendations  No OT follow up;Supervision - Intermittent    Equipment Recommendations  3 in 1 bedside comode    Recommendations for Other Services       Precautions / Restrictions Precautions Precautions: Fall;Knee Precaution Comments: Reviewed precautions Required Braces or Orthoses: Knee Immobilizer - Right Knee Immobilizer - Right: On when out of bed or walking Restrictions Weight Bearing Restrictions: Yes RLE Weight Bearing: Weight bearing as tolerated      Mobility Bed Mobility     General bed mobility comments: not assessed  Transfers Overall transfer level: Needs assistance Equipment used: Rolling walker (2 wheeled) Transfers: Sit to/from Stand Sit to Stand: Min guard         General transfer comment: cues for technique.         ADL Overall ADL's : Needs assistance/impaired     Grooming: Oral care; washed/dryed hands; Set up;Supervision/safety;Standing               Lower Body Dressing: Min guard;Sit to/from stand   Toilet Transfer: Min guard;Ambulation;RW;Comfort height toilet;Grab bars   Toileting- Clothing Manipulation and Hygiene: Min guard;Sit to/from stand       Functional mobility during ADLs: Min guard;Rolling walker General ADL Comments: Educated on dressing technique and safety tips for home (sitting for LB ADLs, rugs, use of basket on walker, safe shoewear). Discussed 3 in 1. Discussed tub transfer techniques and options for shower chair.  Explained benefit of reaching to don/doff right sock as it allows knee to bend.     Vision                      Perception     Praxis      Pertinent Vitals/Pain Pain Assessment: 0-10 Pain Score: 4  Pain Location: Rt knee Pain Descriptors / Indicators: Aching Pain Intervention(s): Repositioned;Ice applied     Hand Dominance     Extremity/Trunk Assessment Upper Extremity Assessment Upper Extremity Assessment: Overall WFL for tasks assessed   Lower Extremity Assessment Lower Extremity Assessment: Defer to PT evaluation       Communication Communication Communication: No difficulties   Cognition Arousal/Alertness: Awake/alert Behavior During Therapy: WFL for tasks assessed/performed Overall Cognitive Status: Within Functional Limits for tasks assessed                     General Comments          Shoulder Instructions      Home Living Family/patient expects to be discharged to:: Private residence Living Arrangements: Spouse/significant other Available Help at Discharge: Family;Available 24 hours/day Type of Home: House Home Access: Stairs to enter [employer] of Steps: 2 Entrance Stairs-Rails: None Home Layout: One level     Bathroom Shower/Tub: Tub/shower unit Shower/tub characteristics: Door Biochemist, clinical: Standard     Home Equipment:  (has chair for shower)          Prior Functioning/Environment Level of Independence: Independent             OT Diagnosis: Acute pain   OT Problem List: Decreased strength;Decreased knowledge of use of DME or AE;Decreased  knowledge of precautions;Pain   OT Treatment/Interventions: Self-care/ADL training;DME and/or AE instruction;Therapeutic activities;Patient/family education;Balance training    OT Goals(Current goals can be found in the care plan section) Acute Rehab OT Goals Patient Stated Goal: not stated OT Goal Formulation: With patient Time For Goal Achievement: 05/17/14 Potential to Achieve Goals: Good ADL Goals Pt Will Perform Lower Body Dressing: with modified independence;sit to/from  stand Pt Will Transfer to Toilet: with modified independence;ambulating (3 in 1 over commode) Pt Will Perform Tub/Shower Transfer: Tub transfer;with supervision;ambulating;rolling walker (tub equipment tbd)  OT Frequency: Min 2X/week   Barriers to D/C:            Co-evaluation              End of Session Equipment Utilized During Treatment: Gait belt;Rolling walker;Right knee immobilizer CPM Right Knee CPM Right Knee: Off  Activity Tolerance: Patient tolerated treatment well Patient left: in chair;with call bell/phone within reach   Time: 0931-0958 OT Time Calculation (min): 27 min Charges:  OT General Charges $OT Visit: 1 Procedure OT Evaluation $Initial OT Evaluation Tier I: 1 Procedure OT Treatments $Self Care/Home Management : 8-22 mins G-CodesBenito Mccreedy OTR/L [phone] 05/10/2014, 10:07 AM

## 2014-05-11 LAB — CBC
HCT: 32.4 % — ABNORMAL LOW (ref 36.0–46.0)
Hemoglobin: 10.6 g/dL — ABNORMAL LOW (ref 12.0–15.0)
MCH: 29.4 pg (ref 26.0–34.0)
MCHC: 32.7 g/dL (ref 30.0–36.0)
MCV: 89.8 fL (ref 78.0–100.0)
PLATELETS: 230 10*3/uL (ref 150–400)
RBC: 3.61 MIL/uL — AB (ref 3.87–5.11)
RDW: 13.8 % (ref 11.5–15.5)
WBC: 13.1 10*3/uL — ABNORMAL HIGH (ref 4.0–10.5)

## 2014-05-11 LAB — PROTIME-INR
INR: 1.75 — AB (ref 0.00–1.49)
PROTHROMBIN TIME: 20.4 s — AB (ref 11.6–15.2)

## 2014-05-11 MED ORDER — WARFARIN SODIUM 5 MG PO TABS
5.0000 mg | ORAL_TABLET | Freq: Once | ORAL | Status: AC
  Administered 2014-05-11: 5 mg via ORAL
  Filled 2014-05-11: qty 1

## 2014-05-11 NOTE — Progress Notes (Signed)
Physical Therapy Treatment Patient Details Name: Elizabeth Gray MRN: [id number] DOB: 22-Jul-1941 Today's Date: 05/11/2014    History of Present Illness Pt is a 73 y.o. female s/p Rt TKA 05/09/14.    PT Comments    Pt progressing well with mobility.  Began stair training this session.  Pt states she has 2 steps to enter house & 11 steps to get to basement/dining area.  Will need to continued practice with 11 steps before d/c home.  Cont with current POC & d/c recommendation of HHPT    Follow Up Recommendations  Home health PT;Supervision/Assistance - 24 hour     Equipment Recommendations  Rolling walker with 5" wheels;3in1 (PT)    Recommendations for Other Services OT consult     Precautions / Restrictions Precautions Precautions: Fall;Knee Precaution Comments: Reviewed precautions Required Braces or Orthoses: Knee Immobilizer - Right Knee Immobilizer - Right: On when out of bed or walking Restrictions RLE Weight Bearing: Weight bearing as tolerated    Mobility  Bed Mobility               General bed mobility comments: Pt sitting in recliner upon arrival  Transfers Overall transfer level: Needs assistance Equipment used: Rolling walker (2 wheeled) Transfers: Sit to/from Stand Sit to Stand: Supervision         General transfer comment: cues for RLE positioning due to KI  Ambulation/Gait Ambulation/Gait assistance: Min guard Ambulation Distance (Feet): 100 Feet Assistive device: Rolling walker (2 wheeled) Gait Pattern/deviations: Step-through pattern;Decreased step length - left;Decreased stride length Gait velocity: decreased   General Gait Details: cues for increased step/stride length & increased WBing RLE during stance phase.     Stairs Stairs: Yes Stairs assistance: Min guard Stair Management: One rail Left;Step to pattern;Forwards   General stair comments: Pt has 2 steps to enter house without rails- practiced with rails to simulate holding  onto doorframe.  Also has 11 steps to get to basement/dining area; Practiced 4 steps with bil UE support on 1 rail.  Cues for sequencing.    Wheelchair Mobility    Modified Rankin (Stroke Patients Only)       Balance                                    Cognition Arousal/Alertness: Awake/alert Behavior During Therapy: WFL for tasks assessed/performed Overall Cognitive Status: Within Functional Limits for tasks assessed                      Exercises Total Joint Exercises Ankle Circles/Pumps: AROM;Both;10 reps Quad Sets: AROM;Both;10 reps Straight Leg Raises: AAROM;Strengthening;Right;10 reps Long Arc Quad: AAROM;Strengthening;Right;10 reps    General Comments        Pertinent Vitals/Pain Pain Assessment: 0-10 Pain Score: 3  Pain Location: Rt knee Pain Descriptors / Indicators: Burning Pain Intervention(s): Repositioned;Monitored during session;Ice applied    Home Living                      Prior Function            PT Goals (current goals can now be found in the care plan section) Acute Rehab PT Goals Patient Stated Goal: not stated PT Goal Formulation: With patient Time For Goal Achievement: 05/16/14 Potential to Achieve Goals: Good Progress towards PT goals: Progressing toward goals    Frequency  7X/week    PT Plan Current plan remains  appropriate    Co-evaluation             End of Session Equipment Utilized During Treatment: Right knee immobilizer Activity Tolerance: Patient tolerated treatment well Patient left: in chair;with call bell/phone within reach     Time: 1004-1027 PT Time Calculation (min): 23 min  Charges:  $Gait Training: 8-22 mins $Therapeutic Activity: 8-22 mins                      Sena Hitch 05/11/2014, 10:33 AM  Sarajane Marek, PTA [phone] 05/11/2014

## 2014-05-11 NOTE — Progress Notes (Signed)
ANTICOAGULATION CONSULT NOTE - f/u  Pharmacy Consult for warfarin  Indication: VTE prophylaxis  Allergies  Allergen Reactions  . Metoclopramide Hcl Other (See Comments)    REGLAN="burning in mouth"  . Oxymetazoline Hcl Itching    AFRIN SPRAY  . Sucralfate Other (See Comments)    CARAFATE=Mouth sores    Vital Signs: Temp: 98.4 F (36.9 C) (10/11 0630) Temp Source: Oral (10/11 0630) BP: 108/58 mmHg (10/11 0630) Pulse Rate: 82 (10/11 0630)  Labs:  Recent Labs  05/09/14 1415 05/10/14 0402 05/11/14 0355  HGB 11.1* 10.4* 10.6*  HCT 33.9* 31.4* 32.4*  PLT 241 236 230  LABPROT 14.5 15.8* 20.4*  INR 1.13 1.26 1.75*  CREATININE  --  0.70  --     Estimated Creatinine Clearance: 57.4 ml/min (by C-G formula based on Cr of 0.7).   Medical History: Past Medical History  Diagnosis Date  . Nonorganic sleep disorder, unspecified   . Fatigue   . Pulmonary sarcoidosis   . Hypothyroidism   . Polio 1948  . Vitamin D deficiency   . Vitamin B12 deficiency   . Osteoarthritis   . GERD (gastroesophageal reflux disease)   . Pancreatitis   . Personal history of colonic polyps   . Iron deficiency anemia, unspecified   . Lower esophageal ring 2008    EGD  . External hemorrhoids without mention of complication 6160    Colonoscopy   . Bursitis   . Sleep apnea     cpap since 09 [facility]  . Headache(784.0)     migraines  . Heat rash     under the breasts.Marland Kitchenappeared on monday.Marland KitchenMarland KitchenBurning & itching, uses cortisone    Medications:  Prescriptions prior to admission  Medication Sig Dispense Refill  . Alum Hydroxide-Mag Carbonate (GAVISCON PO) Take 1 tablet by mouth as needed (reflux). As directed as needed      . aspirin 81 MG tablet Take 81 mg by mouth daily.      . beclomethasone (QVAR) 40 MCG/ACT inhaler Inhale 2 puffs into the lungs 2 (two) times daily.      . Cholecalciferol (VITAMIN D PO) Take 1,000 Units by mouth daily.       . cyanocobalamin (,VITAMIN  B-12,) 1000 MCG/ML injection Inject 1,000 mcg into the muscle every 14 (fourteen) days.      . Flaxseed, Linseed, (FLAXSEED OIL) OIL Take 1 capsule by mouth 2 (two) times daily.        . Glucosamine-Chondroit-Vit C-Mn (GLUCOSAMINE CHONDR 1500 COMPLX PO) Take 1 tablet by mouth 2 (two) times daily.        Marland Kitchen levothyroxine (SYNTHROID, LEVOTHROID) 100 MCG tablet Take 100 mcg by mouth daily before breakfast.      . magnesium oxide (MAG-OX) 400 MG tablet Take 400 mg by mouth at bedtime.      . Multiple Vitamins-Minerals (ICAPS AREDS FORMULA PO) Take 1 capsule by mouth daily.        Marland Kitchen omeprazole-sodium bicarbonate (ZEGERID) 40-1100 MG per capsule Take 1 capsule by mouth daily before breakfast.  30 capsule  2  . OVER THE COUNTER MEDICATION Take 5 tablets by mouth 2 (two) times daily. Supplement - [place]      . Pancrelipase, Lip-Prot-Amyl, (CREON) 24000 UNITS CPEP Take 1 capsule (24,000 Units total) by mouth 3 (three) times daily.  90 capsule  1  . Polyethyl Glycol-Propyl Glycol (SYSTANE ULTRA) 0.4-0.3 % SOLN Apply 1 drop to eye as needed (dry eyes).       Marland Kitchen  topiramate (TOPAMAX) 100 MG tablet Take 100 mg by mouth 2 (two) times daily.      . traMADol (ULTRAM) 50 MG tablet Take 100 mg by mouth at bedtime.       . vitamin C (ASCORBIC ACID) 500 MG tablet Take 500 mg by mouth daily.        Marland Kitchen zolpidem (AMBIEN) 10 MG tablet Take 5 mg by mouth at bedtime as needed for sleep.         Assessment: 73 yo female s/p R TKA (POD1).  Continuing on warfarin for post-op prophylaxis. INR increased 1.26>>1.75, still subtherapeutic.  Hgb mildy low stable, plts wnl. No bleeding documented. Pt will need counseling as this is a new medication for her.  Goal of Therapy:  INR 2-3 Monitor platelets by anticoagulation protocol: Yes    Plan:  Warfarin 5 mg po x1 Daily PT/INR Monitor s/sx bleeding  Elicia Lamp, PharmD Clinical Pharmacist - Resident Pager [phone] 05/11/2014 9:21 AM

## 2014-05-11 NOTE — Progress Notes (Signed)
Orthopedics Progress Note  Subjective: I am feeling better today.  Objective:  Filed Vitals:   05/11/14 0630  BP: 108/58  Pulse: 82  Temp: 98.4 F (36.9 C)  Resp: 16    General: Awake and alert  Musculoskeletal: right knee incision looks good, no erythema, no drainage, no cords, neg Homan's sign Neurovascularly intact  Lab Results  Component Value Date   WBC 13.1* 05/11/2014   HGB 10.6* 05/11/2014   HCT 32.4* 05/11/2014   MCV 89.8 05/11/2014   PLT 230 05/11/2014       Component Value Date/Time   NA 140 05/10/2014 0402   K 3.7 05/10/2014 0402   CL 106 05/10/2014 0402   CO2 23 05/10/2014 0402   GLUCOSE 110* 05/10/2014 0402   BUN 10 05/10/2014 0402   CREATININE 0.70 05/10/2014 0402   CALCIUM 8.0* 05/10/2014 0402   GFRNONAA 83* 05/10/2014 0402   GFRAA >90 05/10/2014 0402    Lab Results  Component Value Date   INR 1.75* 05/11/2014   INR 1.26 05/10/2014   INR 1.13 05/09/2014    Assessment/Plan: POD #2 s/p Procedure(s): RIGHT TOTAL KNEE ARTHROPLASTY doing very well Had a fever last night.  No pain with urination.  Likely atelectasis - incentive spirometer. Continue PT, OT d/c planning. Per PT recommendation will be D/C'd with home health PT Still no BM. Suppository tomorrow if still no BM.  Doran Heater. Elizabeth Fells, MD 05/11/2014 10:53 AM

## 2014-05-12 ENCOUNTER — Encounter (HOSPITAL_COMMUNITY): Payer: Self-pay | Admitting: Orthopedic Surgery

## 2014-05-12 LAB — CBC
HCT: 31.8 % — ABNORMAL LOW (ref 36.0–46.0)
HEMOGLOBIN: 10.5 g/dL — AB (ref 12.0–15.0)
MCH: 29.5 pg (ref 26.0–34.0)
MCHC: 33 g/dL (ref 30.0–36.0)
MCV: 89.3 fL (ref 78.0–100.0)
PLATELETS: 248 10*3/uL (ref 150–400)
RBC: 3.56 MIL/uL — ABNORMAL LOW (ref 3.87–5.11)
RDW: 13.6 % (ref 11.5–15.5)
WBC: 11.7 10*3/uL — ABNORMAL HIGH (ref 4.0–10.5)

## 2014-05-12 LAB — PROTIME-INR
INR: 3.03 — ABNORMAL HIGH (ref 0.00–1.49)
Prothrombin Time: 31.4 seconds — ABNORMAL HIGH (ref 11.6–15.2)

## 2014-05-12 NOTE — Progress Notes (Signed)
ANTICOAGULATION CONSULT NOTE - f/u  Pharmacy Consult for warfarin  Indication: VTE prophylaxis  Allergies  Allergen Reactions  . Metoclopramide Hcl Other (See Comments)    REGLAN="burning in mouth"  . Oxymetazoline Hcl Itching    AFRIN SPRAY  . Sucralfate Other (See Comments)    CARAFATE=Mouth sores    Vital Signs: Temp: 98.5 F (36.9 C) (10/12 0630) Temp Source: Oral (10/12 0630) BP: 122/57 mmHg (10/12 0630) Pulse Rate: 81 (10/12 0630)  Labs:  Recent Labs  05/10/14 0402 05/11/14 0355 05/12/14 0618  HGB 10.4* 10.6* 10.5*  HCT 31.4* 32.4* 31.8*  PLT 236 230 248  LABPROT 15.8* 20.4* 31.4*  INR 1.26 1.75* 3.03*  CREATININE 0.70  --   --     Estimated Creatinine Clearance: 57.4 ml/min (by C-G formula based on Cr of 0.7).  Assessment: 73 yo female s/p R TKA (POD3). Continuing on warfarin for post-op prophylaxis. INR increased 1.75 > 3.03, big increase on 5mg  daily x 3 days. CBC stable, no bleeding reported. Plan for discharge today. I feel current coumadin dose is high for her, she probably will only need 2.5 mg daily. Pt. Is educated. I told her to hold coumadin dose today if she goes home, and only take 2.5 mg (half a tablet) daily from tomorrow until INR check. Spoke with case manager, will schedule home health RN to check PT/INR tomorrow (10/13).  Goal of Therapy:  INR 2-3 Monitor platelets by anticoagulation protocol: Yes    Plan:  Hold coumadin tonight Daily PT/INR Monitor s/sx bleeding If goes home, she is likely only need 2.5mg  daily. Patient is educated.  Maryanna Shape, PharmD, BCPS  Clinical Pharmacist  Pager: [phone]  05/12/2014 10:52 AM

## 2014-05-12 NOTE — Progress Notes (Signed)
Spoke with Dr. [name], who was in the OR, about the coumadin dose for discharging home. He ordered 2.5mg  today, 5mg  tomorrow, and alternate that until the next INR check with home health. This information was communicated to the patient and husband.

## 2014-05-12 NOTE — Progress Notes (Signed)
Physical Therapy Treatment Patient Details Name: Elizabeth Gray MRN: [id number] DOB: 1941/02/07 Today's Date: 05/12/2014    History of Present Illness      PT Comments    Excellent progress noted. Plan is for d/c home today with HHPT.  Follow Up Recommendations  Home health PT;Supervision/Assistance - 24 hour     Equipment Recommendations  Rolling walker with 5" wheels;3in1 (PT)    Recommendations for Other Services       Precautions / Restrictions Precautions Precautions: Fall;Knee Required Braces or Orthoses: Knee Immobilizer - Right Knee Immobilizer - Right: On when out of bed or walking Restrictions Weight Bearing Restrictions: Yes RLE Weight Bearing: Weight bearing as tolerated    Mobility  Bed Mobility           Sit to supine: Modified independent (Device/Increase time)      Transfers   Equipment used: Rolling walker (2 wheeled)   Sit to Stand: Modified independent (Device/Increase time) Stand pivot transfers: Supervision          Ambulation/Gait Ambulation/Gait assistance: Supervision Ambulation Distance (Feet): 180 Feet Assistive device: Rolling walker (2 wheeled) Gait Pattern/deviations: Step-through pattern;Decreased stride length Gait velocity: decreased       Stairs Stairs: Yes Stairs assistance: Min guard Stair Management: One rail Left;Step to pattern;Forwards Number of Stairs: 5    Wheelchair Mobility    Modified Rankin (Stroke Patients Only)       Balance                                    Cognition Arousal/Alertness: Awake/alert Behavior During Therapy: WFL for tasks assessed/performed Overall Cognitive Status: Within Functional Limits for tasks assessed                      Exercises      General Comments        Pertinent Vitals/Pain Pain Assessment: 0-10 Pain Score: 3  Pain Location: R knee Pain Intervention(s): Repositioned;Monitored during session    Home Living                       Prior Function            PT Goals (current goals can now be found in the care plan section) Progress towards PT goals: Progressing toward goals    Frequency  7X/week    PT Plan Current plan remains appropriate    Co-evaluation             End of Session Equipment Utilized During Treatment: Gait belt;Right knee immobilizer Activity Tolerance: Patient tolerated treatment well Patient left: in bed;in CPM;with call bell/phone within reach     Time: 0904-0928 PT Time Calculation (min): 24 min  Charges:  $Gait Training: 23-37 mins                    G Codes:      Lorriane Shire 05/12/2014, 10:34 AM

## 2014-05-12 NOTE — Care Management Note (Signed)
CARE MANAGEMENT NOTE 05/12/2014  Patient:  Elizabeth Gray, Elizabeth Gray   Account Number:  [id number]  Date Initiated:  05/09/2014  Documentation initiated by:  Ricki Trompeter  Subjective/Objective Assessment:   73 yr old female s/p right total knee arthroplasty.     Action/Plan:   Patient preoperatively setup with [facility] no change.  PT/OT eval   Anticipated DC Date:  05/12/2014   Anticipated DC Plan:  [place]  CM consult      [facility] Choice  HOME HEALTH  DURABLE MEDICAL EQUIPMENT   Choice offered to / List presented to:  C-1 Patient   DME arranged  3-N-1  [place]  CPM      DME agency  [employer]     [place] arranged  HH-2 PT  HH-1 RN      [facility] agency  [facility]   Status of service:  Completed, signed off Medicare Important Message given?  YES (If response is "NO", the following Medicare IM given date fields will be blank) Date Medicare IM given:  05/12/2014 Medicare IM given by:  Ricki Maulding Date Additional Medicare IM given:   Additional Medicare IM given by:    Discharge Disposition:  [place]  Per UR Regulation:  Reviewed for med. necessity/level of care/duration of stay

## 2014-05-12 NOTE — Discharge Summary (Signed)
Physician Discharge Summary   Patient ID: Elizabeth Gray MRN: [id number] DOB/AGE: 1941/05/21 73 y.o.  Admit date: 05/09/2014 Discharge date: 05/12/2014  Admission Diagnoses:  Active Problems:   Degenerative arthritis of right knee   Discharge Diagnoses:  Same   Surgeries: Procedure(s): RIGHT TOTAL KNEE ARTHROPLASTY on 05/09/2014   Consultants: PT/OT  Discharged Condition: Stable  Hospital Course: Elizabeth Gray is an 73 y.o. female who was admitted 05/09/2014 with a chief complaint of No chief complaint on file. , and found to have a diagnosis of <principal problem not specified>.  They were brought to the operating room on 05/09/2014 and underwent the above named procedures.    The patient had an uncomplicated hospital course and was stable for discharge.  Recent vital signs:  Filed Vitals:   05/12/14 0800  BP:   Pulse:   Temp:   Resp: 18    Recent laboratory studies:  Results for orders placed during the hospital encounter of 05/09/14  PROTIME-INR      Result Value Ref Range   Prothrombin Time 14.5  11.6 - 15.2 seconds   INR 1.13  0.00 - 1.49  CBC      Result Value Ref Range   WBC 12.1 (*) 4.0 - 10.5 K/uL   RBC 3.87  3.87 - 5.11 MIL/uL   Hemoglobin 11.1 (*) 12.0 - 15.0 g/dL   HCT 33.9 (*) 36.0 - 46.0 %   MCV 87.6  78.0 - 100.0 fL   MCH 28.7  26.0 - 34.0 pg   MCHC 32.7  30.0 - 36.0 g/dL   RDW 13.8  11.5 - 15.5 %   Platelets 241  150 - 400 K/uL  PROTIME-INR      Result Value Ref Range   Prothrombin Time 15.8 (*) 11.6 - 15.2 seconds   INR 1.26  0.00 - 1.49  CBC      Result Value Ref Range   WBC 10.7 (*) 4.0 - 10.5 K/uL   RBC 3.50 (*) 3.87 - 5.11 MIL/uL   Hemoglobin 10.4 (*) 12.0 - 15.0 g/dL   HCT 31.4 (*) 36.0 - 46.0 %   MCV 89.7  78.0 - 100.0 fL   MCH 29.7  26.0 - 34.0 pg   MCHC 33.1  30.0 - 36.0 g/dL   RDW 13.6  11.5 - 15.5 %   Platelets 236  150 - 400 K/uL  BASIC METABOLIC PANEL      Result Value Ref Range   Sodium 140  137 - 147 mEq/L   Potassium 3.7  3.7 - 5.3 mEq/L   Chloride 106  96 - 112 mEq/L   CO2 23  19 - 32 mEq/L   Glucose, Bld 110 (*) 70 - 99 mg/dL   BUN 10  6 - 23 mg/dL   Creatinine, Ser 0.70  0.50 - 1.10 mg/dL   Calcium 8.0 (*) 8.4 - 10.5 mg/dL   GFR calc non Af Amer 83 (*) >90 mL/min   GFR calc Af Amer >90  >90 mL/min   Anion gap 11  5 - 15  PROTIME-INR      Result Value Ref Range   Prothrombin Time 20.4 (*) 11.6 - 15.2 seconds   INR 1.75 (*) 0.00 - 1.49  CBC      Result Value Ref Range   WBC 13.1 (*) 4.0 - 10.5 K/uL   RBC 3.61 (*) 3.87 - 5.11 MIL/uL   Hemoglobin 10.6 (*) 12.0 - 15.0 g/dL   HCT 32.4 (*) 36.0 -  46.0 %   MCV 89.8  78.0 - 100.0 fL   MCH 29.4  26.0 - 34.0 pg   MCHC 32.7  30.0 - 36.0 g/dL   RDW 13.8  11.5 - 15.5 %   Platelets 230  150 - 400 K/uL  PROTIME-INR      Result Value Ref Range   Prothrombin Time 31.4 (*) 11.6 - 15.2 seconds   INR 3.03 (*) 0.00 - 1.49  CBC      Result Value Ref Range   WBC 11.7 (*) 4.0 - 10.5 K/uL   RBC 3.56 (*) 3.87 - 5.11 MIL/uL   Hemoglobin 10.5 (*) 12.0 - 15.0 g/dL   HCT 31.8 (*) 36.0 - 46.0 %   MCV 89.3  78.0 - 100.0 fL   MCH 29.5  26.0 - 34.0 pg   MCHC 33.0  30.0 - 36.0 g/dL   RDW 13.6  11.5 - 15.5 %   Platelets 248  150 - 400 K/uL    Discharge Medications:     Medication List         aspirin 81 MG tablet  Take 81 mg by mouth daily.     beclomethasone 40 MCG/ACT inhaler  Commonly known as:  QVAR  Inhale 2 puffs into the lungs 2 (two) times daily.     cyanocobalamin 1000 MCG/ML injection  Commonly known as:  (VITAMIN B-12)  Inject 1,000 mcg into the muscle every 14 (fourteen) days.     Flaxseed Oil Oil  Take 1 capsule by mouth 2 (two) times daily.     GAVISCON PO  Take 1 tablet by mouth as needed (reflux). As directed as needed     GLUCOSAMINE CHONDR 1500 COMPLX PO  Take 1 tablet by mouth 2 (two) times daily.     ICAPS AREDS FORMULA PO  Take 1 capsule by mouth daily.     levothyroxine 100 MCG tablet  Commonly known as:   SYNTHROID, LEVOTHROID  Take 100 mcg by mouth daily before breakfast.     magnesium oxide 400 MG tablet  Commonly known as:  MAG-OX  Take 400 mg by mouth at bedtime.     methocarbamol 500 MG tablet  Commonly known as:  ROBAXIN  Take 1 tablet (500 mg total) by mouth 3 (three) times daily as needed.     omeprazole-sodium bicarbonate 40-1100 MG per capsule  Commonly known as:  ZEGERID  Take 1 capsule by mouth daily before breakfast.     OVER THE COUNTER MEDICATION  Take 5 tablets by mouth 2 (two) times daily. Supplement - [employer]uncy Lean Wan     oxyCODONE-acetaminophen 5-325 MG per tablet  Commonly known as:  ROXICET  Take 1-2 tablets by mouth every 4 (four) hours as needed for severe pain.     Pancrelipase (Lip-Prot-Amyl) 24000 UNITS Cpep  Commonly known as:  CREON  Take 1 capsule (24,000 Units total) by mouth 3 (three) times daily.     SYSTANE ULTRA 0.4-0.3 % Soln  Generic drug:  Polyethyl Glycol-Propyl Glycol  Apply 1 drop to eye as needed (dry eyes).     topiramate 100 MG tablet  Commonly known as:  TOPAMAX  Take 100 mg by mouth 2 (two) times daily.     traMADol 50 MG tablet  Commonly known as:  ULTRAM  Take 100 mg by mouth at bedtime.     vitamin C 500 MG tablet  Commonly known as:  ASCORBIC ACID  Take 500 mg by mouth daily.  VITAMIN D PO  Take 1,000 Units by mouth daily.     warfarin 5 MG tablet  Commonly known as:  COUMADIN  Take 1 tablet (5 mg total) by mouth daily.     zolpidem 10 MG tablet  Commonly known as:  AMBIEN  Take 5 mg by mouth at bedtime as needed for sleep.        Diagnostic Studies: Dg Chest 2 View  04/28/2014   CLINICAL DATA:  Sarcoidosis. Preop for knee replacement. Sleep apnea. History of bronchitis.  EXAM: CHEST  2 VIEW  COMPARISON:  11/21/2008  FINDINGS: Heart size is normal. There is mild prominence of interstitial markings. No focal consolidations or pleural effusions. No plain film evidence for adenopathy. Surgical clips are noted  in the right upper quadrant of the abdomen. Visualized osseous structures have a normal appearance.  IMPRESSION: 1. No focal pulmonary abnormality. 2. Mildly prominent interstitial markings consistent with known sarcoidosis.   Electronically Signed   By: Shon Hale M.D.   On: 04/28/2014 15:21   Dg Knee Right Port  05/09/2014   CLINICAL DATA:  Total knee replacement  EXAM: PORTABLE RIGHT KNEE - 1-2 VIEW  COMPARISON:  None.  FINDINGS: The right knee demonstrates a total knee arthroplasty without evidence of hardware failure complication. There is no significant joint effusion. There is no fracture or dislocation. The alignment is anatomic. Post-surgical changes noted in the surrounding soft tissues.  IMPRESSION: Right total knee arthroplasty.   Electronically Signed   By: Kathreen Devoid   On: 05/09/2014 11:22    Disposition: 01-Home or Self Care      Discharge Instructions   Weight bearing as tolerated    Complete by:  As directed   Laterality:  right  Extremity:  Lower           Follow-up Information   Follow up with NORRIS,STEVEN R, MD. Call in 2 weeks. [phone])    Specialty:  Orthopedic Surgery   Contact information:   [address] [place] [address] [phone]        Signed: Ventura Bruns 05/12/2014, 10:03 AM

## 2014-05-12 NOTE — Discharge Instructions (Signed)
Ice the knee constantly!  Don't prop anything behind the knee.   Keep the leg elevated when possible and prop under the ankle or calf to encourage the knee to go flat  Keep the incision covered and dry for one week, then ok to shower.  Do exercises throughout the day  You may put full weight on the right leg.  Use immobilizer at night to sleep  CPM 0-60 degrees, increase 10 degrees per day... Use CPM for 6 hours per day in two hour sessions.  Follow up in two weeks with Dr [name]  [phone]  Information on my medicine - Coumadin   (Warfarin)  This medication education was reviewed with me or my healthcare representative as part of my discharge preparation.  Why was Coumadin prescribed for you? Coumadin was prescribed for you because you have a blood clot or a medical condition that can cause an increased risk of forming blood clots. Blood clots can cause serious health problems by blocking the flow of blood to the heart, lung, or brain. Coumadin can prevent harmful blood clots from forming. As a reminder your indication for Coumadin is:   Blood Clot Prevention After Orthopedic Surgery  What test will check on my response to Coumadin? While on Coumadin (warfarin) you will need to have an INR test regularly to ensure that your dose is keeping you in the desired range. The INR (international normalized ratio) number is calculated from the result of the laboratory test called prothrombin time (PT).  If an INR APPOINTMENT HAS NOT ALREADY BEEN MADE FOR YOU please schedule an appointment to have this lab work done by your health care provider within 7 days. Your INR goal is usually a number between:  2 to 3.  What  do you need to  know  About  COUMADIN? Take Coumadin (warfarin) exactly as prescribed by your healthcare provider about the same time each day.  DO NOT stop taking without talking to the doctor who prescribed the medication.  Stopping without other blood clot prevention medication to  take the place of Coumadin may increase your risk of developing a new clot or stroke.  Get refills before you run out.  What do you do if you miss a dose? If you miss a dose, take it as soon as you remember on the same day then continue your regularly scheduled regimen the next day.  Do not take two doses of Coumadin at the same time.  Important Safety Information A possible side effect of Coumadin (Warfarin) is an increased risk of bleeding. You should call your healthcare provider right away if you experience any of the following:   Bleeding from an injury or your nose that does not stop.   Unusual colored urine (red or dark brown) or unusual colored stools (red or black).   Unusual bruising for unknown reasons.   A serious fall or if you hit your head (even if there is no bleeding).  Some foods or medicines interact with Coumadin (warfarin) and might alter your response to warfarin. To help avoid this:   Eat a balanced diet, maintaining a consistent amount of Vitamin K.   Notify your provider about major diet changes you plan to make.   Avoid alcohol or limit your intake to 1 drink for women and 2 drinks for men per day. (1 drink is 5 oz. wine, 12 oz. beer, or 1.5 oz. liquor.)  Make sure that ANY health care provider who prescribes medication for you  knows that you are taking Coumadin (warfarin).  Also make sure the healthcare provider who is monitoring your Coumadin knows when you have started a new medication including herbals and non-prescription products.  Coumadin (Warfarin)  Major Drug Interactions  Increased Warfarin Effect Decreased Warfarin Effect  Alcohol (large quantities) Antibiotics (esp. Septra/Bactrim, Flagyl, Cipro) Amiodarone (Cordarone) Aspirin (ASA) Cimetidine (Tagamet) Megestrol (Megace) NSAIDs (ibuprofen, naproxen, etc.) Piroxicam (Feldene) Propafenone (Rythmol SR) Propranolol (Inderal) Isoniazid (INH) Posaconazole (Noxafil) Barbiturates  (Phenobarbital) Carbamazepine (Tegretol) Chlordiazepoxide (Librium) Cholestyramine (Questran) Griseofulvin Oral Contraceptives Rifampin Sucralfate (Carafate) Vitamin K   Coumadin (Warfarin) Major Herbal Interactions  Increased Warfarin Effect Decreased Warfarin Effect  Garlic Ginseng Ginkgo biloba Coenzyme Q10 Green tea St. Johns wort    Coumadin (Warfarin) FOOD Interactions  Eat a consistent number of servings per week of foods HIGH in Vitamin K (1 serving =  cup)  Collards (cooked, or boiled & drained) Kale (cooked, or boiled & drained) Mustard greens (cooked, or boiled & drained) Parsley *serving size only =  cup Spinach (cooked, or boiled & drained) Swiss chard (cooked, or boiled & drained) Turnip greens (cooked, or boiled & drained)  Eat a consistent number of servings per week of foods MEDIUM-HIGH in Vitamin K (1 serving = 1 cup)  Asparagus (cooked, or boiled & drained) Broccoli (cooked, boiled & drained, or raw & chopped) Brussel sprouts (cooked, or boiled & drained) *serving size only =  cup Lettuce, raw (green leaf, endive, romaine) Spinach, raw Turnip greens, raw & chopped   These websites have more information on Coumadin (warfarin):  FailFactory.se; VeganReport.com.au;

## 2014-05-12 NOTE — Progress Notes (Signed)
   Subjective: 3 Days Post-Op Procedure(s) (LRB): RIGHT TOTAL KNEE ARTHROPLASTY (Right)  Pt doing very well Ready for d/c home Minimal pain to right knee Patient reports pain as mild.  Objective:   VITALS:   Filed Vitals:   05/12/14 0800  BP:   Pulse:   Temp:   Resp: 18    Right knee incision healing well nv intact distally No rashes or edema  LABS  Recent Labs  05/10/14 0402 05/11/14 0355 05/12/14 0618  HGB 10.4* 10.6* 10.5*  HCT 31.4* 32.4* 31.8*  WBC 10.7* 13.1* 11.7*  PLT 236 230 248     Recent Labs  05/10/14 0402  NA 140  K 3.7  BUN 10  CREATININE 0.70  GLUCOSE 110*     Assessment/Plan: 3 Days Post-Op Procedure(s) (LRB): RIGHT TOTAL KNEE ARTHROPLASTY (Right) D/c home today Pain control as needed F/u in 2 weeks   Merla Riches, MPAS, PA-C  05/12/2014, 10:03 AM

## 2014-05-13 ENCOUNTER — Ambulatory Visit: Payer: Medicare Other | Admitting: Physical Therapy

## 2014-05-13 DIAGNOSIS — Z471 Aftercare following joint replacement surgery: Secondary | ICD-10-CM | POA: Diagnosis not present

## 2014-05-13 DIAGNOSIS — R269 Unspecified abnormalities of gait and mobility: Secondary | ICD-10-CM | POA: Diagnosis not present

## 2014-05-13 DIAGNOSIS — Z7901 Long term (current) use of anticoagulants: Secondary | ICD-10-CM | POA: Diagnosis not present

## 2014-05-13 DIAGNOSIS — Z87891 Personal history of nicotine dependence: Secondary | ICD-10-CM | POA: Diagnosis not present

## 2014-05-13 DIAGNOSIS — Z96651 Presence of right artificial knee joint: Secondary | ICD-10-CM | POA: Diagnosis not present

## 2014-05-13 DIAGNOSIS — Z5181 Encounter for therapeutic drug level monitoring: Secondary | ICD-10-CM | POA: Diagnosis not present

## 2014-05-14 DIAGNOSIS — Z87891 Personal history of nicotine dependence: Secondary | ICD-10-CM | POA: Diagnosis not present

## 2014-05-14 DIAGNOSIS — Z7901 Long term (current) use of anticoagulants: Secondary | ICD-10-CM | POA: Diagnosis not present

## 2014-05-14 DIAGNOSIS — R269 Unspecified abnormalities of gait and mobility: Secondary | ICD-10-CM | POA: Diagnosis not present

## 2014-05-14 DIAGNOSIS — Z5181 Encounter for therapeutic drug level monitoring: Secondary | ICD-10-CM | POA: Diagnosis not present

## 2014-05-14 DIAGNOSIS — Z471 Aftercare following joint replacement surgery: Secondary | ICD-10-CM | POA: Diagnosis not present

## 2014-05-14 DIAGNOSIS — Z96651 Presence of right artificial knee joint: Secondary | ICD-10-CM | POA: Diagnosis not present

## 2014-05-15 DIAGNOSIS — Z7901 Long term (current) use of anticoagulants: Secondary | ICD-10-CM | POA: Diagnosis not present

## 2014-05-15 DIAGNOSIS — Z96651 Presence of right artificial knee joint: Secondary | ICD-10-CM | POA: Diagnosis not present

## 2014-05-15 DIAGNOSIS — Z471 Aftercare following joint replacement surgery: Secondary | ICD-10-CM | POA: Diagnosis not present

## 2014-05-15 DIAGNOSIS — Z5181 Encounter for therapeutic drug level monitoring: Secondary | ICD-10-CM | POA: Diagnosis not present

## 2014-05-15 DIAGNOSIS — R269 Unspecified abnormalities of gait and mobility: Secondary | ICD-10-CM | POA: Diagnosis not present

## 2014-05-15 DIAGNOSIS — Z87891 Personal history of nicotine dependence: Secondary | ICD-10-CM | POA: Diagnosis not present

## 2014-05-16 DIAGNOSIS — Z7901 Long term (current) use of anticoagulants: Secondary | ICD-10-CM | POA: Diagnosis not present

## 2014-05-16 DIAGNOSIS — Z96651 Presence of right artificial knee joint: Secondary | ICD-10-CM | POA: Diagnosis not present

## 2014-05-16 DIAGNOSIS — Z5181 Encounter for therapeutic drug level monitoring: Secondary | ICD-10-CM | POA: Diagnosis not present

## 2014-05-16 DIAGNOSIS — Z471 Aftercare following joint replacement surgery: Secondary | ICD-10-CM | POA: Diagnosis not present

## 2014-05-16 DIAGNOSIS — Z87891 Personal history of nicotine dependence: Secondary | ICD-10-CM | POA: Diagnosis not present

## 2014-05-16 DIAGNOSIS — R269 Unspecified abnormalities of gait and mobility: Secondary | ICD-10-CM | POA: Diagnosis not present

## 2014-05-19 DIAGNOSIS — Z96651 Presence of right artificial knee joint: Secondary | ICD-10-CM | POA: Diagnosis not present

## 2014-05-19 DIAGNOSIS — Z471 Aftercare following joint replacement surgery: Secondary | ICD-10-CM | POA: Diagnosis not present

## 2014-05-19 DIAGNOSIS — Z5181 Encounter for therapeutic drug level monitoring: Secondary | ICD-10-CM | POA: Diagnosis not present

## 2014-05-19 DIAGNOSIS — Z87891 Personal history of nicotine dependence: Secondary | ICD-10-CM | POA: Diagnosis not present

## 2014-05-19 DIAGNOSIS — Z7901 Long term (current) use of anticoagulants: Secondary | ICD-10-CM | POA: Diagnosis not present

## 2014-05-19 DIAGNOSIS — R269 Unspecified abnormalities of gait and mobility: Secondary | ICD-10-CM | POA: Diagnosis not present

## 2014-05-20 DIAGNOSIS — Z96651 Presence of right artificial knee joint: Secondary | ICD-10-CM | POA: Diagnosis not present

## 2014-05-20 DIAGNOSIS — Z5181 Encounter for therapeutic drug level monitoring: Secondary | ICD-10-CM | POA: Diagnosis not present

## 2014-05-20 DIAGNOSIS — Z87891 Personal history of nicotine dependence: Secondary | ICD-10-CM | POA: Diagnosis not present

## 2014-05-20 DIAGNOSIS — Z7901 Long term (current) use of anticoagulants: Secondary | ICD-10-CM | POA: Diagnosis not present

## 2014-05-20 DIAGNOSIS — R269 Unspecified abnormalities of gait and mobility: Secondary | ICD-10-CM | POA: Diagnosis not present

## 2014-05-20 DIAGNOSIS — Z471 Aftercare following joint replacement surgery: Secondary | ICD-10-CM | POA: Diagnosis not present

## 2014-05-21 DIAGNOSIS — Z471 Aftercare following joint replacement surgery: Secondary | ICD-10-CM | POA: Diagnosis not present

## 2014-05-21 DIAGNOSIS — Z96651 Presence of right artificial knee joint: Secondary | ICD-10-CM | POA: Diagnosis not present

## 2014-05-21 DIAGNOSIS — Z7901 Long term (current) use of anticoagulants: Secondary | ICD-10-CM | POA: Diagnosis not present

## 2014-05-21 DIAGNOSIS — Z5181 Encounter for therapeutic drug level monitoring: Secondary | ICD-10-CM | POA: Diagnosis not present

## 2014-05-21 DIAGNOSIS — Z87891 Personal history of nicotine dependence: Secondary | ICD-10-CM | POA: Diagnosis not present

## 2014-05-21 DIAGNOSIS — R269 Unspecified abnormalities of gait and mobility: Secondary | ICD-10-CM | POA: Diagnosis not present

## 2014-05-22 ENCOUNTER — Encounter: Payer: Self-pay | Admitting: *Deleted

## 2014-05-22 DIAGNOSIS — Z471 Aftercare following joint replacement surgery: Secondary | ICD-10-CM | POA: Diagnosis not present

## 2014-05-22 DIAGNOSIS — Z96651 Presence of right artificial knee joint: Secondary | ICD-10-CM | POA: Diagnosis not present

## 2014-05-23 ENCOUNTER — Telehealth: Payer: Self-pay | Admitting: Family Medicine

## 2014-05-23 DIAGNOSIS — Z7901 Long term (current) use of anticoagulants: Secondary | ICD-10-CM | POA: Diagnosis not present

## 2014-05-23 DIAGNOSIS — Z5181 Encounter for therapeutic drug level monitoring: Secondary | ICD-10-CM | POA: Diagnosis not present

## 2014-05-23 DIAGNOSIS — R269 Unspecified abnormalities of gait and mobility: Secondary | ICD-10-CM | POA: Diagnosis not present

## 2014-05-23 DIAGNOSIS — Z96651 Presence of right artificial knee joint: Secondary | ICD-10-CM | POA: Diagnosis not present

## 2014-05-23 DIAGNOSIS — Z471 Aftercare following joint replacement surgery: Secondary | ICD-10-CM | POA: Diagnosis not present

## 2014-05-23 DIAGNOSIS — Z87891 Personal history of nicotine dependence: Secondary | ICD-10-CM | POA: Diagnosis not present

## 2014-05-23 NOTE — Telephone Encounter (Signed)
Patient aware that she would have to get her protimes through her primary care that she would have to be a patient here in order to have her protimes at our facillity

## 2014-05-26 ENCOUNTER — Ambulatory Visit: Payer: Medicare Other | Attending: Orthopedic Surgery | Admitting: Physical Therapy

## 2014-05-26 DIAGNOSIS — M25561 Pain in right knee: Secondary | ICD-10-CM | POA: Diagnosis not present

## 2014-05-26 DIAGNOSIS — Z5189 Encounter for other specified aftercare: Secondary | ICD-10-CM | POA: Insufficient documentation

## 2014-05-26 DIAGNOSIS — M5030 Other cervical disc degeneration, unspecified cervical region: Secondary | ICD-10-CM | POA: Diagnosis not present

## 2014-05-26 DIAGNOSIS — Z96652 Presence of left artificial knee joint: Secondary | ICD-10-CM | POA: Insufficient documentation

## 2014-05-27 DIAGNOSIS — Z7901 Long term (current) use of anticoagulants: Secondary | ICD-10-CM | POA: Diagnosis not present

## 2014-05-27 DIAGNOSIS — Z9660 Presence of unspecified orthopedic joint implant: Secondary | ICD-10-CM | POA: Diagnosis not present

## 2014-05-27 DIAGNOSIS — Z5181 Encounter for therapeutic drug level monitoring: Secondary | ICD-10-CM | POA: Diagnosis not present

## 2014-05-27 DIAGNOSIS — Z471 Aftercare following joint replacement surgery: Secondary | ICD-10-CM | POA: Diagnosis not present

## 2014-05-28 ENCOUNTER — Ambulatory Visit: Payer: Medicare Other | Admitting: Physical Therapy

## 2014-05-28 DIAGNOSIS — R269 Unspecified abnormalities of gait and mobility: Secondary | ICD-10-CM | POA: Diagnosis not present

## 2014-05-28 DIAGNOSIS — Z5181 Encounter for therapeutic drug level monitoring: Secondary | ICD-10-CM | POA: Diagnosis not present

## 2014-05-28 DIAGNOSIS — M5030 Other cervical disc degeneration, unspecified cervical region: Secondary | ICD-10-CM | POA: Diagnosis not present

## 2014-05-28 DIAGNOSIS — Z96652 Presence of left artificial knee joint: Secondary | ICD-10-CM | POA: Diagnosis not present

## 2014-05-28 DIAGNOSIS — M25561 Pain in right knee: Secondary | ICD-10-CM | POA: Diagnosis not present

## 2014-05-28 DIAGNOSIS — Z471 Aftercare following joint replacement surgery: Secondary | ICD-10-CM | POA: Diagnosis not present

## 2014-05-28 DIAGNOSIS — Z87891 Personal history of nicotine dependence: Secondary | ICD-10-CM | POA: Diagnosis not present

## 2014-05-28 DIAGNOSIS — Z5189 Encounter for other specified aftercare: Secondary | ICD-10-CM | POA: Diagnosis not present

## 2014-05-28 DIAGNOSIS — Z7901 Long term (current) use of anticoagulants: Secondary | ICD-10-CM | POA: Diagnosis not present

## 2014-05-28 DIAGNOSIS — Z96651 Presence of right artificial knee joint: Secondary | ICD-10-CM | POA: Diagnosis not present

## 2014-05-30 ENCOUNTER — Ambulatory Visit: Payer: Medicare Other | Admitting: Physical Therapy

## 2014-05-30 DIAGNOSIS — Z5181 Encounter for therapeutic drug level monitoring: Secondary | ICD-10-CM | POA: Diagnosis not present

## 2014-05-30 DIAGNOSIS — Z96659 Presence of unspecified artificial knee joint: Secondary | ICD-10-CM | POA: Diagnosis not present

## 2014-05-30 DIAGNOSIS — M5030 Other cervical disc degeneration, unspecified cervical region: Secondary | ICD-10-CM | POA: Diagnosis not present

## 2014-05-30 DIAGNOSIS — M25561 Pain in right knee: Secondary | ICD-10-CM | POA: Diagnosis not present

## 2014-05-30 DIAGNOSIS — Z5189 Encounter for other specified aftercare: Secondary | ICD-10-CM | POA: Diagnosis not present

## 2014-05-30 DIAGNOSIS — Z96652 Presence of left artificial knee joint: Secondary | ICD-10-CM | POA: Diagnosis not present

## 2014-05-30 DIAGNOSIS — Z7901 Long term (current) use of anticoagulants: Secondary | ICD-10-CM | POA: Diagnosis not present

## 2014-06-02 ENCOUNTER — Ambulatory Visit: Payer: Medicare Other | Attending: Orthopedic Surgery | Admitting: Physical Therapy

## 2014-06-02 DIAGNOSIS — Z5189 Encounter for other specified aftercare: Secondary | ICD-10-CM | POA: Insufficient documentation

## 2014-06-02 DIAGNOSIS — M25561 Pain in right knee: Secondary | ICD-10-CM | POA: Diagnosis not present

## 2014-06-02 DIAGNOSIS — Z96652 Presence of left artificial knee joint: Secondary | ICD-10-CM | POA: Diagnosis not present

## 2014-06-02 DIAGNOSIS — M5030 Other cervical disc degeneration, unspecified cervical region: Secondary | ICD-10-CM | POA: Diagnosis not present

## 2014-06-03 DIAGNOSIS — Z7901 Long term (current) use of anticoagulants: Secondary | ICD-10-CM | POA: Diagnosis not present

## 2014-06-04 ENCOUNTER — Ambulatory Visit: Payer: Medicare Other | Admitting: Physical Therapy

## 2014-06-04 DIAGNOSIS — Z5189 Encounter for other specified aftercare: Secondary | ICD-10-CM | POA: Diagnosis not present

## 2014-06-06 ENCOUNTER — Ambulatory Visit: Payer: Medicare Other | Admitting: Physical Therapy

## 2014-06-06 DIAGNOSIS — Z5181 Encounter for therapeutic drug level monitoring: Secondary | ICD-10-CM | POA: Diagnosis not present

## 2014-06-06 DIAGNOSIS — Z5189 Encounter for other specified aftercare: Secondary | ICD-10-CM | POA: Diagnosis not present

## 2014-06-06 DIAGNOSIS — Z7901 Long term (current) use of anticoagulants: Secondary | ICD-10-CM | POA: Diagnosis not present

## 2014-06-09 ENCOUNTER — Ambulatory Visit: Payer: Medicare Other | Admitting: Physical Therapy

## 2014-06-09 DIAGNOSIS — Z5189 Encounter for other specified aftercare: Secondary | ICD-10-CM | POA: Diagnosis not present

## 2014-06-11 ENCOUNTER — Ambulatory Visit: Payer: Medicare Other | Admitting: Physical Therapy

## 2014-06-11 DIAGNOSIS — Z5189 Encounter for other specified aftercare: Secondary | ICD-10-CM | POA: Diagnosis not present

## 2014-06-12 ENCOUNTER — Ambulatory Visit: Payer: Medicare Other | Admitting: Physical Therapy

## 2014-06-12 DIAGNOSIS — Z5189 Encounter for other specified aftercare: Secondary | ICD-10-CM | POA: Diagnosis not present

## 2014-06-16 ENCOUNTER — Ambulatory Visit: Payer: Medicare Other | Admitting: Physical Therapy

## 2014-06-16 DIAGNOSIS — Z5189 Encounter for other specified aftercare: Secondary | ICD-10-CM | POA: Diagnosis not present

## 2014-06-18 ENCOUNTER — Ambulatory Visit: Payer: Medicare Other | Admitting: Physical Therapy

## 2014-06-18 ENCOUNTER — Encounter: Payer: Self-pay | Admitting: Diagnostic Neuroimaging

## 2014-06-18 DIAGNOSIS — Z5189 Encounter for other specified aftercare: Secondary | ICD-10-CM | POA: Diagnosis not present

## 2014-06-20 ENCOUNTER — Ambulatory Visit: Payer: Medicare Other | Admitting: Physical Therapy

## 2014-06-20 ENCOUNTER — Encounter: Payer: Self-pay | Admitting: Internal Medicine

## 2014-06-20 ENCOUNTER — Ambulatory Visit (INDEPENDENT_AMBULATORY_CARE_PROVIDER_SITE_OTHER): Payer: Medicare Other | Admitting: Internal Medicine

## 2014-06-20 VITALS — BP 98/60 | HR 80 | Ht 63.5 in | Wt 151.1 lb

## 2014-06-20 DIAGNOSIS — Z5189 Encounter for other specified aftercare: Secondary | ICD-10-CM | POA: Diagnosis not present

## 2014-06-20 DIAGNOSIS — R1010 Upper abdominal pain, unspecified: Secondary | ICD-10-CM

## 2014-06-20 MED ORDER — PANCRELIPASE (LIP-PROT-AMYL) 24000-76000 UNITS PO CPEP: 24000.0000 [IU] | ORAL_CAPSULE | Freq: Three times a day (TID) | ORAL | Status: DC

## 2014-06-20 NOTE — Assessment & Plan Note (Signed)
Refill Creon RTC 2 years

## 2014-06-20 NOTE — Progress Notes (Signed)
   Subjective:    Patient ID: Elizabeth Gray, female    DOB: 1941/04/10, 73 y.o.   MRN: [id number]  HPI The patient is here for follow-up she has some chronic abdominal pain that seems to respond to pancreatic insufficiency. She has some occasional left upper quadrant cramps but is otherwise well. She would like a refill on her Creon.  Medications, allergies, past medical history, past surgical history, family history and social history are reviewed and updated in the EMR.  Review of Systems As above    Objective:   Physical Exam Abdomen is soft and nontender    Assessment & Plan:   Upper abdominal pain-chronic Refill Creon RTC 2 years

## 2014-06-20 NOTE — Patient Instructions (Signed)
We have sent the following medications to your pharmacy for you to pick up at your convenience: Creon    I appreciate the opportunity to care for you.

## 2014-06-23 ENCOUNTER — Ambulatory Visit: Payer: Medicare Other | Admitting: Physical Therapy

## 2014-06-23 DIAGNOSIS — Z5189 Encounter for other specified aftercare: Secondary | ICD-10-CM | POA: Diagnosis not present

## 2014-06-30 ENCOUNTER — Ambulatory Visit: Payer: Medicare Other | Admitting: Physical Therapy

## 2014-06-30 DIAGNOSIS — Z5189 Encounter for other specified aftercare: Secondary | ICD-10-CM | POA: Diagnosis not present

## 2014-07-04 ENCOUNTER — Ambulatory Visit: Payer: Medicare Other | Attending: Orthopedic Surgery | Admitting: Physical Therapy

## 2014-07-04 DIAGNOSIS — M25561 Pain in right knee: Secondary | ICD-10-CM | POA: Insufficient documentation

## 2014-07-04 DIAGNOSIS — M5030 Other cervical disc degeneration, unspecified cervical region: Secondary | ICD-10-CM | POA: Insufficient documentation

## 2014-07-04 DIAGNOSIS — Z5189 Encounter for other specified aftercare: Secondary | ICD-10-CM | POA: Insufficient documentation

## 2014-07-04 DIAGNOSIS — Z96652 Presence of left artificial knee joint: Secondary | ICD-10-CM | POA: Diagnosis not present

## 2014-07-07 ENCOUNTER — Ambulatory Visit: Payer: Medicare Other | Admitting: Physical Therapy

## 2014-07-07 DIAGNOSIS — Z96652 Presence of left artificial knee joint: Secondary | ICD-10-CM | POA: Diagnosis not present

## 2014-07-07 DIAGNOSIS — Z5189 Encounter for other specified aftercare: Secondary | ICD-10-CM | POA: Diagnosis not present

## 2014-07-07 DIAGNOSIS — M5030 Other cervical disc degeneration, unspecified cervical region: Secondary | ICD-10-CM | POA: Diagnosis not present

## 2014-07-07 DIAGNOSIS — M25561 Pain in right knee: Secondary | ICD-10-CM | POA: Diagnosis not present

## 2014-07-11 ENCOUNTER — Ambulatory Visit: Payer: Medicare Other | Admitting: Physical Therapy

## 2014-07-11 DIAGNOSIS — M5030 Other cervical disc degeneration, unspecified cervical region: Secondary | ICD-10-CM | POA: Diagnosis not present

## 2014-07-11 DIAGNOSIS — Z5189 Encounter for other specified aftercare: Secondary | ICD-10-CM | POA: Diagnosis not present

## 2014-07-11 DIAGNOSIS — M25561 Pain in right knee: Secondary | ICD-10-CM | POA: Diagnosis not present

## 2014-07-11 DIAGNOSIS — Z96652 Presence of left artificial knee joint: Secondary | ICD-10-CM | POA: Diagnosis not present

## 2014-07-14 ENCOUNTER — Ambulatory Visit: Payer: Medicare Other | Admitting: Physical Therapy

## 2014-07-14 DIAGNOSIS — Z96652 Presence of left artificial knee joint: Secondary | ICD-10-CM | POA: Diagnosis not present

## 2014-07-14 DIAGNOSIS — M5030 Other cervical disc degeneration, unspecified cervical region: Secondary | ICD-10-CM | POA: Diagnosis not present

## 2014-07-14 DIAGNOSIS — M25561 Pain in right knee: Secondary | ICD-10-CM | POA: Diagnosis not present

## 2014-07-14 DIAGNOSIS — Z5189 Encounter for other specified aftercare: Secondary | ICD-10-CM | POA: Diagnosis not present

## 2014-07-18 ENCOUNTER — Ambulatory Visit: Payer: Medicare Other | Admitting: Physical Therapy

## 2014-07-18 DIAGNOSIS — M25561 Pain in right knee: Secondary | ICD-10-CM | POA: Diagnosis not present

## 2014-07-18 DIAGNOSIS — M5030 Other cervical disc degeneration, unspecified cervical region: Secondary | ICD-10-CM | POA: Diagnosis not present

## 2014-07-18 DIAGNOSIS — Z96652 Presence of left artificial knee joint: Secondary | ICD-10-CM | POA: Diagnosis not present

## 2014-07-18 DIAGNOSIS — Z5189 Encounter for other specified aftercare: Secondary | ICD-10-CM | POA: Diagnosis not present

## 2014-07-21 ENCOUNTER — Ambulatory Visit: Payer: Medicare Other | Admitting: Physical Therapy

## 2014-07-21 DIAGNOSIS — M25561 Pain in right knee: Secondary | ICD-10-CM | POA: Diagnosis not present

## 2014-07-21 DIAGNOSIS — M5030 Other cervical disc degeneration, unspecified cervical region: Secondary | ICD-10-CM | POA: Diagnosis not present

## 2014-07-21 DIAGNOSIS — Z5189 Encounter for other specified aftercare: Secondary | ICD-10-CM | POA: Diagnosis not present

## 2014-07-21 DIAGNOSIS — Z96652 Presence of left artificial knee joint: Secondary | ICD-10-CM | POA: Diagnosis not present

## 2014-07-28 ENCOUNTER — Other Ambulatory Visit: Payer: Self-pay | Admitting: Internal Medicine

## 2014-07-28 ENCOUNTER — Ambulatory Visit: Payer: Medicare Other | Admitting: Physical Therapy

## 2014-07-28 ENCOUNTER — Encounter: Payer: Self-pay | Admitting: Internal Medicine

## 2014-07-28 ENCOUNTER — Ambulatory Visit (INDEPENDENT_AMBULATORY_CARE_PROVIDER_SITE_OTHER): Payer: Medicare Other | Admitting: Internal Medicine

## 2014-07-28 ENCOUNTER — Other Ambulatory Visit (INDEPENDENT_AMBULATORY_CARE_PROVIDER_SITE_OTHER): Payer: Medicare Other

## 2014-07-28 VITALS — BP 140/70 | HR 62 | Temp 97.8°F | Wt 150.0 lb

## 2014-07-28 DIAGNOSIS — Z52008 Unspecified donor, other blood: Secondary | ICD-10-CM

## 2014-07-28 DIAGNOSIS — E559 Vitamin D deficiency, unspecified: Secondary | ICD-10-CM | POA: Diagnosis not present

## 2014-07-28 DIAGNOSIS — E038 Other specified hypothyroidism: Secondary | ICD-10-CM

## 2014-07-28 DIAGNOSIS — E538 Deficiency of other specified B group vitamins: Secondary | ICD-10-CM

## 2014-07-28 DIAGNOSIS — E034 Atrophy of thyroid (acquired): Secondary | ICD-10-CM | POA: Diagnosis not present

## 2014-07-28 DIAGNOSIS — M5030 Other cervical disc degeneration, unspecified cervical region: Secondary | ICD-10-CM | POA: Diagnosis not present

## 2014-07-28 DIAGNOSIS — M1711 Unilateral primary osteoarthritis, right knee: Secondary | ICD-10-CM | POA: Diagnosis not present

## 2014-07-28 DIAGNOSIS — Z5189 Encounter for other specified aftercare: Secondary | ICD-10-CM | POA: Diagnosis not present

## 2014-07-28 DIAGNOSIS — M25561 Pain in right knee: Secondary | ICD-10-CM | POA: Diagnosis not present

## 2014-07-28 DIAGNOSIS — Z96652 Presence of left artificial knee joint: Secondary | ICD-10-CM | POA: Diagnosis not present

## 2014-07-28 LAB — HEPATIC FUNCTION PANEL
ALT: 16 U/L (ref 0–35)
AST: 21 U/L (ref 0–37)
Albumin: 3.9 g/dL (ref 3.5–5.2)
Alkaline Phosphatase: 61 U/L (ref 39–117)
BILIRUBIN TOTAL: 0.3 mg/dL (ref 0.2–1.2)
Bilirubin, Direct: 0.1 mg/dL (ref 0.0–0.3)
Total Protein: 7.3 g/dL (ref 6.0–8.3)

## 2014-07-28 LAB — CBC WITH DIFFERENTIAL/PLATELET
BASOS PCT: 0.4 % (ref 0.0–3.0)
Basophils Absolute: 0 10*3/uL (ref 0.0–0.1)
EOS ABS: 0.3 10*3/uL (ref 0.0–0.7)
Eosinophils Relative: 3.4 % (ref 0.0–5.0)
HEMATOCRIT: 39.2 % (ref 36.0–46.0)
HEMOGLOBIN: 12.6 g/dL (ref 12.0–15.0)
LYMPHS ABS: 2.9 10*3/uL (ref 0.7–4.0)
Lymphocytes Relative: 31.3 % (ref 12.0–46.0)
MCHC: 32.2 g/dL (ref 30.0–36.0)
MCV: 87 fl (ref 78.0–100.0)
Monocytes Absolute: 0.8 10*3/uL (ref 0.1–1.0)
Monocytes Relative: 8.6 % (ref 3.0–12.0)
NEUTROS ABS: 5.3 10*3/uL (ref 1.4–7.7)
Neutrophils Relative %: 56.3 % (ref 43.0–77.0)
Platelets: 392 10*3/uL (ref 150.0–400.0)
RBC: 4.5 Mil/uL (ref 3.87–5.11)
RDW: 13.9 % (ref 11.5–15.5)
WBC: 9.4 10*3/uL (ref 4.0–10.5)

## 2014-07-28 LAB — BASIC METABOLIC PANEL
BUN: 18 mg/dL (ref 6–23)
CALCIUM: 9.5 mg/dL (ref 8.4–10.5)
CO2: 32 mEq/L (ref 19–32)
Chloride: 104 mEq/L (ref 96–112)
Creatinine, Ser: 0.7 mg/dL (ref 0.4–1.2)
GFR: 92.62 mL/min (ref >60.00)
GLUCOSE: 91 mg/dL (ref 70–99)
Potassium: 4.6 mEq/L (ref 3.5–5.1)
SODIUM: 142 meq/L (ref 135–145)

## 2014-07-28 LAB — IBC PANEL
Iron: 52 ug/dL (ref 42–145)
SATURATION RATIOS: 13.4 % — AB (ref 20.0–50.0)
Transferrin: 277.3 mg/dL (ref 212.0–360.0)

## 2014-07-28 LAB — VITAMIN D 25 HYDROXY (VIT D DEFICIENCY, FRACTURES): VITD: 50.17 ng/mL (ref 30.00–100.00)

## 2014-07-28 LAB — VITAMIN B12: Vitamin B-12: 392 pg/mL (ref 211–911)

## 2014-07-28 LAB — TSH: TSH: 0.71 u[IU]/mL (ref 0.35–4.50)

## 2014-07-28 MED ORDER — ZOLPIDEM TARTRATE 10 MG PO TABS: 5.0000 mg | ORAL_TABLET | Freq: Every evening | ORAL | Status: DC | PRN

## 2014-07-28 NOTE — Progress Notes (Signed)
Pre visit review using our clinic review tool, if applicable. No additional management support is needed unless otherwise documented below in the visit note. 

## 2014-07-28 NOTE — Assessment & Plan Note (Signed)
Continue with current prescription therapy as reflected on the Med list.  

## 2014-07-28 NOTE — Assessment & Plan Note (Signed)
Chronic  Continue with current prescription therapy as reflected on the Med list.  

## 2014-07-28 NOTE — Assessment & Plan Note (Addendum)
Stopped in 2014

## 2014-07-28 NOTE — Progress Notes (Signed)
   Subjective:    HPI F/u R knee OA - TKR on 05/09/14  F/u ringing in ears x 12+ mo The patient presents for a follow-up of  chronic hypertension, chronic dyslipidemia, allergies, B12 deficiency controlled with medicines  Wt Readings from Last 3 Encounters:  07/28/14 150 lb (68.04 kg)  06/20/14 151 lb 2 oz (68.55 kg)  05/11/14 156 lb (70.761 kg)   BP Readings from Last 3 Encounters:  07/28/14 140/70  06/20/14 98/60  05/12/14 122/57       Review of Systems  Constitutional: Negative for chills, activity change, appetite change, fatigue and unexpected weight change.  HENT: Positive for rhinorrhea. Negative for congestion, mouth sores and sinus pressure.   Eyes: Negative for visual disturbance.  Respiratory: Negative for cough and chest tightness.   Gastrointestinal: Negative for nausea and abdominal pain.  Genitourinary: Negative for frequency, difficulty urinating and vaginal pain.  Musculoskeletal: Positive for arthralgias. Negative for back pain and gait problem.  Skin: Negative for pallor and rash.  Neurological: Negative for dizziness, tremors, weakness, numbness and headaches.  Psychiatric/Behavioral: Negative for confusion and sleep disturbance.       Objective:   Physical Exam  Constitutional: She appears well-developed. No distress.  HENT:  Head: Normocephalic.  Right Ear: External ear normal.  Left Ear: External ear normal.  Nose: Nose normal.  Mouth/Throat: Oropharynx is clear and moist.  Eyes: Conjunctivae are normal. Pupils are equal, round, and reactive to light. Right eye exhibits no discharge. Left eye exhibits no discharge.  Neck: Normal range of motion. Neck supple. No JVD present. No tracheal deviation present. No thyromegaly present.  Cardiovascular: Normal rate, regular rhythm and normal heart sounds.   Pulmonary/Chest: No stridor. No respiratory distress. She has no wheezes.  Abdominal: Soft. Bowel sounds are normal. She exhibits no distension and  no mass. There is no tenderness. There is no rebound and no guarding.  Musculoskeletal: She exhibits no edema or tenderness.  Lymphadenopathy:    She has no cervical adenopathy.  Neurological: She displays normal reflexes. No cranial nerve deficit. She exhibits normal muscle tone. Coordination normal.  Skin: No rash noted. No erythema.  Psychiatric: She has a normal mood and affect. Her behavior is normal. Judgment and thought content normal.  R knee is less tender w/ROM, scar  Lab Results  Component Value Date   WBC 11.7* 05/12/2014   HGB 10.5* 05/12/2014   HCT 31.8* 05/12/2014   PLT 248 05/12/2014   GLUCOSE 110* 05/10/2014   CHOL 150 01/27/2014   TRIG 100.0 01/27/2014   HDL 66.90 01/27/2014   LDLCALC 63 01/27/2014   ALT 22 07/19/2013   AST 27 07/19/2013   NA 140 05/10/2014   K 3.7 05/10/2014   CL 106 05/10/2014   CREATININE 0.70 05/10/2014   BUN 10 05/10/2014   CO2 23 05/10/2014   TSH 1.72 01/27/2014   INR 3.03* 05/12/2014         Assessment & Plan:

## 2014-07-28 NOTE — Assessment & Plan Note (Signed)
10/15 R TKR Dr [name]

## 2014-07-29 ENCOUNTER — Ambulatory Visit: Payer: Medicare Other | Admitting: Physical Therapy

## 2014-07-29 DIAGNOSIS — Z96652 Presence of left artificial knee joint: Secondary | ICD-10-CM | POA: Diagnosis not present

## 2014-07-29 DIAGNOSIS — M5030 Other cervical disc degeneration, unspecified cervical region: Secondary | ICD-10-CM | POA: Diagnosis not present

## 2014-07-29 DIAGNOSIS — M25561 Pain in right knee: Secondary | ICD-10-CM | POA: Diagnosis not present

## 2014-07-29 DIAGNOSIS — Z5189 Encounter for other specified aftercare: Secondary | ICD-10-CM | POA: Diagnosis not present

## 2014-07-31 DIAGNOSIS — Z471 Aftercare following joint replacement surgery: Secondary | ICD-10-CM | POA: Diagnosis not present

## 2014-07-31 DIAGNOSIS — M1711 Unilateral primary osteoarthritis, right knee: Secondary | ICD-10-CM | POA: Diagnosis not present

## 2014-07-31 DIAGNOSIS — Z96651 Presence of right artificial knee joint: Secondary | ICD-10-CM | POA: Diagnosis not present

## 2014-08-04 ENCOUNTER — Telehealth: Payer: Self-pay | Admitting: Internal Medicine

## 2014-08-04 NOTE — Telephone Encounter (Signed)
Pharmacy called in and wanted Dr Camila Li to call in 90 day supply.  Pt went ahead and picked up the 30 day but 90 day supply is cheaper for pt.

## 2014-08-05 NOTE — Telephone Encounter (Signed)
Noted../lmb 

## 2014-08-08 ENCOUNTER — Ambulatory Visit: Payer: Medicare Other | Attending: Orthopedic Surgery | Admitting: Physical Therapy

## 2014-08-08 DIAGNOSIS — M5030 Other cervical disc degeneration, unspecified cervical region: Secondary | ICD-10-CM | POA: Insufficient documentation

## 2014-08-08 DIAGNOSIS — Z5189 Encounter for other specified aftercare: Secondary | ICD-10-CM | POA: Insufficient documentation

## 2014-08-08 DIAGNOSIS — Z96652 Presence of left artificial knee joint: Secondary | ICD-10-CM | POA: Insufficient documentation

## 2014-08-08 DIAGNOSIS — M25561 Pain in right knee: Secondary | ICD-10-CM | POA: Insufficient documentation

## 2014-08-11 ENCOUNTER — Ambulatory Visit: Payer: Medicare Other | Admitting: Physical Therapy

## 2014-08-11 DIAGNOSIS — Z5189 Encounter for other specified aftercare: Secondary | ICD-10-CM | POA: Diagnosis not present

## 2014-08-11 DIAGNOSIS — Z96652 Presence of left artificial knee joint: Secondary | ICD-10-CM | POA: Diagnosis not present

## 2014-08-11 DIAGNOSIS — M25561 Pain in right knee: Secondary | ICD-10-CM | POA: Diagnosis not present

## 2014-08-11 DIAGNOSIS — M5030 Other cervical disc degeneration, unspecified cervical region: Secondary | ICD-10-CM | POA: Diagnosis not present

## 2014-08-15 ENCOUNTER — Ambulatory Visit: Payer: Medicare Other | Admitting: Physical Therapy

## 2014-08-15 DIAGNOSIS — M5030 Other cervical disc degeneration, unspecified cervical region: Secondary | ICD-10-CM | POA: Diagnosis not present

## 2014-08-15 DIAGNOSIS — Z96652 Presence of left artificial knee joint: Secondary | ICD-10-CM | POA: Diagnosis not present

## 2014-08-15 DIAGNOSIS — Z5189 Encounter for other specified aftercare: Secondary | ICD-10-CM | POA: Diagnosis not present

## 2014-08-15 DIAGNOSIS — M25561 Pain in right knee: Secondary | ICD-10-CM | POA: Diagnosis not present

## 2014-08-18 ENCOUNTER — Ambulatory Visit: Payer: Medicare Other | Admitting: Physical Therapy

## 2014-08-18 DIAGNOSIS — Z5189 Encounter for other specified aftercare: Secondary | ICD-10-CM | POA: Diagnosis not present

## 2014-08-18 DIAGNOSIS — M25561 Pain in right knee: Secondary | ICD-10-CM | POA: Diagnosis not present

## 2014-08-18 DIAGNOSIS — M5030 Other cervical disc degeneration, unspecified cervical region: Secondary | ICD-10-CM | POA: Diagnosis not present

## 2014-08-18 DIAGNOSIS — Z96652 Presence of left artificial knee joint: Secondary | ICD-10-CM | POA: Diagnosis not present

## 2014-08-20 ENCOUNTER — Ambulatory Visit: Payer: Medicare Other | Admitting: Physical Therapy

## 2014-08-25 ENCOUNTER — Ambulatory Visit: Payer: Medicare Other | Admitting: Physical Therapy

## 2014-08-25 DIAGNOSIS — M25561 Pain in right knee: Secondary | ICD-10-CM | POA: Diagnosis not present

## 2014-08-25 DIAGNOSIS — M5030 Other cervical disc degeneration, unspecified cervical region: Secondary | ICD-10-CM | POA: Diagnosis not present

## 2014-08-25 DIAGNOSIS — Z5189 Encounter for other specified aftercare: Secondary | ICD-10-CM | POA: Diagnosis not present

## 2014-08-25 DIAGNOSIS — Z96652 Presence of left artificial knee joint: Secondary | ICD-10-CM | POA: Diagnosis not present

## 2014-08-29 ENCOUNTER — Ambulatory Visit: Payer: Medicare Other | Admitting: Physical Therapy

## 2014-08-29 DIAGNOSIS — Z5189 Encounter for other specified aftercare: Secondary | ICD-10-CM | POA: Diagnosis not present

## 2014-08-29 DIAGNOSIS — Z96652 Presence of left artificial knee joint: Secondary | ICD-10-CM | POA: Diagnosis not present

## 2014-08-29 DIAGNOSIS — M5030 Other cervical disc degeneration, unspecified cervical region: Secondary | ICD-10-CM | POA: Diagnosis not present

## 2014-08-29 DIAGNOSIS — M25561 Pain in right knee: Secondary | ICD-10-CM | POA: Diagnosis not present

## 2014-09-01 ENCOUNTER — Ambulatory Visit: Payer: Medicare Other | Attending: Orthopedic Surgery | Admitting: Physical Therapy

## 2014-09-01 DIAGNOSIS — Z5189 Encounter for other specified aftercare: Secondary | ICD-10-CM | POA: Insufficient documentation

## 2014-09-01 DIAGNOSIS — M5030 Other cervical disc degeneration, unspecified cervical region: Secondary | ICD-10-CM | POA: Diagnosis not present

## 2014-09-01 DIAGNOSIS — Z1231 Encounter for screening mammogram for malignant neoplasm of breast: Secondary | ICD-10-CM | POA: Diagnosis not present

## 2014-09-01 DIAGNOSIS — M25561 Pain in right knee: Secondary | ICD-10-CM | POA: Diagnosis not present

## 2014-09-01 DIAGNOSIS — Z96652 Presence of left artificial knee joint: Secondary | ICD-10-CM | POA: Insufficient documentation

## 2014-09-01 DIAGNOSIS — M899 Disorder of bone, unspecified: Secondary | ICD-10-CM | POA: Diagnosis not present

## 2014-09-05 ENCOUNTER — Encounter: Payer: Medicare Other | Admitting: Physical Therapy

## 2014-09-08 ENCOUNTER — Ambulatory Visit: Payer: Medicare Other | Admitting: Physical Therapy

## 2014-09-08 DIAGNOSIS — M5030 Other cervical disc degeneration, unspecified cervical region: Secondary | ICD-10-CM | POA: Diagnosis not present

## 2014-09-08 DIAGNOSIS — M25561 Pain in right knee: Secondary | ICD-10-CM | POA: Diagnosis not present

## 2014-09-08 DIAGNOSIS — Z5189 Encounter for other specified aftercare: Secondary | ICD-10-CM | POA: Diagnosis not present

## 2014-09-08 DIAGNOSIS — Z96652 Presence of left artificial knee joint: Secondary | ICD-10-CM | POA: Diagnosis not present

## 2014-09-11 DIAGNOSIS — Z471 Aftercare following joint replacement surgery: Secondary | ICD-10-CM | POA: Diagnosis not present

## 2014-09-11 DIAGNOSIS — Z96651 Presence of right artificial knee joint: Secondary | ICD-10-CM | POA: Diagnosis not present

## 2014-09-12 ENCOUNTER — Ambulatory Visit: Payer: Medicare Other | Admitting: Nurse Practitioner

## 2014-09-12 ENCOUNTER — Encounter: Payer: Medicare Other | Admitting: Physical Therapy

## 2014-09-12 DIAGNOSIS — Z01419 Encounter for gynecological examination (general) (routine) without abnormal findings: Secondary | ICD-10-CM | POA: Diagnosis not present

## 2014-09-12 DIAGNOSIS — Z124 Encounter for screening for malignant neoplasm of cervix: Secondary | ICD-10-CM | POA: Diagnosis not present

## 2014-09-12 DIAGNOSIS — Z0000 Encounter for general adult medical examination without abnormal findings: Secondary | ICD-10-CM | POA: Diagnosis not present

## 2014-09-15 ENCOUNTER — Ambulatory Visit: Payer: Medicare Other | Admitting: Diagnostic Neuroimaging

## 2014-09-16 ENCOUNTER — Telehealth: Payer: Self-pay | Admitting: *Deleted

## 2014-09-16 ENCOUNTER — Encounter: Payer: Self-pay | Admitting: Internal Medicine

## 2014-09-16 NOTE — Telephone Encounter (Signed)
Spoke with the pt on the phone and re-rescheduled her follow-up appt off of Friday afternoon per the providers schedule. Pt was agreeable and thanked me

## 2014-09-19 ENCOUNTER — Encounter: Payer: Self-pay | Admitting: Diagnostic Neuroimaging

## 2014-09-19 ENCOUNTER — Ambulatory Visit: Payer: PRIVATE HEALTH INSURANCE | Admitting: Diagnostic Neuroimaging

## 2014-09-19 ENCOUNTER — Ambulatory Visit (INDEPENDENT_AMBULATORY_CARE_PROVIDER_SITE_OTHER): Payer: Medicare Other | Admitting: Diagnostic Neuroimaging

## 2014-09-19 VITALS — BP 113/71 | HR 72 | Ht 63.5 in | Wt 154.6 lb

## 2014-09-19 DIAGNOSIS — G44301 Post-traumatic headache, unspecified, intractable: Secondary | ICD-10-CM | POA: Diagnosis not present

## 2014-09-19 DIAGNOSIS — R200 Anesthesia of skin: Secondary | ICD-10-CM

## 2014-09-19 DIAGNOSIS — G459 Transient cerebral ischemic attack, unspecified: Secondary | ICD-10-CM | POA: Diagnosis not present

## 2014-09-19 DIAGNOSIS — M542 Cervicalgia: Secondary | ICD-10-CM

## 2014-09-19 DIAGNOSIS — G43009 Migraine without aura, not intractable, without status migrainosus: Secondary | ICD-10-CM

## 2014-09-19 DIAGNOSIS — R202 Paresthesia of skin: Secondary | ICD-10-CM | POA: Diagnosis not present

## 2014-09-19 MED ORDER — PREDNISONE 10 MG PO TABS: ORAL_TABLET | ORAL | Status: DC

## 2014-09-19 NOTE — Progress Notes (Signed)
PATIENT: Elizabeth Gray DOB: [birth year]  REASON FOR VISIT: follow up HISTORY FROM: patient  Chief Complaint  Patient presents with  . Follow-up    RM 7  . Headache    Pt fell 4 weeks ago and hit her head, has had migraine since fall, c/o vision problem, finger numbness    HISTORY OF PRESENT ILLNESS:  UPDATE 09/19/14: Since last visit patient was doing well. She had knee replacement surgery in October 2015. Following this patient had forgot to restart topiramate. She continues to do well without headaches. Unfortunately 08/28/2014, patient had a fall when she was helping unload wood from pickup truck, fell backwards and struck her head. She had pain in the back of her head. She was able to finish her work. 2 days later she had recurrence of her prior migraine headaches. She describes left frontal throbbing severe headaches with sensitivity to light and sound. She has been taking Tylenol arthritis strength, 2 tabs twice a day for several weeks. She is also taking topiramate 100 mg twice a day again. This week headaches were slightly better. She rates headaches as 5 out of 10. Also a few days ago patient noticed intermittent tingling in her right hand fingertips, digits 2, 3, 5. Symptoms last 10-20 minutes at a time. She also noticed some discoloration in her right third digit distally, lasting a few minutes. Patient also having some intermittent left-sided neck pain, radiating to left arm.  UPDATE 03/10/14 (LL): Patient is tolerating tinnitus with TPX 100 mg bid for less frequent, less severe headaches. No other known side effects. Pleased with current treatment. Planning right total knee replacement in October.  UPDATE 09/09/13: Since last visit, was doing well until she developed tinnitus. Her PCP advised her to reduce TPX. She reduced to 38m BID, then to 219mBID. Tinnitus improved, but then HA worsened. Now back to 10065mID, but tinnitus has returned. 2 weeks ago had migraine (HA +  nausea), took tylenol and went to sleep with resolution of HA.   UPDATE 09/07/12: Was doing better, then more HA x last 2 months. Not sleeping well at night. Wakes up multiple times to check on husband (who falls asleep in chair). Not much physical activity. HA are similar quality as before.   UPDATE 03/05/12: Doing better. No headaches since last visit. She has had noticed floaters that appear like a feather; notices in am when she may not have had enough sleep, last episode 2 weeks ago. She has a history of floaters since age 89.25olerating TPX 100m48ms without any memory or focus problems or numbness/tingling.   UPDATE 10/31/11: Doing little bit better. On TPX 100mg84m; couldn't tolerate BID dosing. No side effects. Sinus pressure sensation has improved.   PRIOR HPI: 70 ye65 old ambidextrous, right dominant, female with history of pulmonary sarcoidosis, obstructive sleep apnea, osteoarthritis, here for evaluation of headaches since October 2012. Patient reports feeling sinus infection symptoms and frontal headache in October 2012. She was prescribed azithromycin. Her symptoms did not improve. She was then referred to ENT, had CT of the sinuses, which showed no significant sinus disease. For the past 2 months she's been taking tramadol and ibuprofen on a daily basis for knee pain. At the onset of the headache symptom she was taking Sudafed but no other pain medications.  Patient reports pressure and mild throbbing sensation in the bifrontal and bitemporal regions. No nausea, vomiting, photophobia or visual scotoma. Sometimes she has mild blurred vision and phonophobia with  her headaches. Headache severity is 2/10 on a daily basis, sometimes increased to 4 or 5/10 once a week.   REVIEW OF SYSTEMS: Full 14 system review of systems performed and notable only for as per HPI.  ALLERGIES: Allergies  Allergen Reactions  . Metoclopramide Hcl Other (See Comments)    REGLAN="burning in mouth"  . Oxymetazoline  Hcl Itching    AFRIN SPRAY  . Sucralfate Other (See Comments)    CARAFATE=Mouth sores    HOME MEDICATIONS: Outpatient Prescriptions Prior to Visit  Medication Sig Dispense Refill  . Alum Hydroxide-Mag Carbonate (GAVISCON PO) Take 1 tablet by mouth as needed (reflux). As directed as needed    . aspirin 81 MG tablet Take 81 mg by mouth daily.    . beclomethasone (QVAR) 40 MCG/ACT inhaler Inhale 2 puffs into the lungs 2 (two) times daily.    . Cholecalciferol (VITAMIN D PO) Take 1,000 Units by mouth daily.     . cyanocobalamin (,VITAMIN B-12,) 1000 MCG/ML injection Inject 1,000 mcg into the muscle every 14 (fourteen) days.    . Flaxseed, Linseed, (FLAXSEED OIL) OIL Take 1 capsule by mouth 2 (two) times daily.      Marland Kitchen GLUCOSAMINE-CHONDROITIN-MSM-D3 PO Take 1 tablet by mouth 2 (two) times daily.    Marland Kitchen levothyroxine (SYNTHROID, LEVOTHROID) 100 MCG tablet Take 100 mcg by mouth daily before breakfast.    . magnesium oxide (MAG-OX) 400 MG tablet Take 400 mg by mouth at bedtime.    . methocarbamol (ROBAXIN) 500 MG tablet Take 1 tablet (500 mg total) by mouth 3 (three) times daily as needed. 60 tablet 1  . Multiple Vitamins-Minerals (ICAPS AREDS FORMULA PO) Take 1 capsule by mouth daily.      Marland Kitchen omeprazole-sodium bicarbonate (ZEGERID) 40-1100 MG per capsule TAKE 1 CAPSULE BY MOUTH DAILY BEFORE BREAKFAST. 30 capsule 2  . OVER THE COUNTER MEDICATION Take 5 tablets by mouth 2 (two) times daily. Supplement - [place]    . Pancrelipase, Lip-Prot-Amyl, (CREON) 24000 UNITS CPEP Take 1 capsule (24,000 Units total) by mouth 3 (three) times daily. 270 capsule 3  . Polyethyl Glycol-Propyl Glycol (SYSTANE ULTRA) 0.4-0.3 % SOLN Apply 1 drop to eye as needed (dry eyes).     . traMADol (ULTRAM) 50 MG tablet Take 100 mg by mouth at bedtime.     . vitamin C (ASCORBIC ACID) 500 MG tablet Take 500 mg by mouth daily.      Marland Kitchen zolpidem (AMBIEN) 10 MG tablet Take 0.5 tablets (5 mg total) by mouth at bedtime as  needed for sleep. 30 tablet 5   No facility-administered medications prior to visit.    PHYSICAL EXAM Filed Vitals:   09/19/14 0929  BP: 113/71  Pulse: 72  Height: 5' 3.5" (1.613 m)  Weight: 154 lb 9.6 oz (70.126 kg)   Body mass index is 26.95 kg/(m^2).   GENERAL EXAM: Patient is in no distress; well developed, nourished and groomed; neck is supple  CARDIOVASCULAR: Regular rate and rhythm, no murmurs, no carotid bruits  NEUROLOGIC: MENTAL STATUS: awake, alert, language fluent, comprehension intact, naming intact, fund of knowledge appropriate CRANIAL NERVE: no papilledema on fundoscopic exam, pupils equal and reactive to light, visual fields full to confrontation, extraocular muscles intact, no nystagmus, facial sensation and strength symmetric, hearing intact, palate elevates symmetrically, uvula midline, shoulder shrug symmetric, tongue midline. MOTOR: normal bulk and tone, full strength in the BUE, BLE SENSORY: normal and symmetric to light touch, pinprick, temperature, vibration COORDINATION: finger-nose-finger, fine  finger movements normal REFLEXES: deep tendon reflexes present and symmetric GAIT/STATION: narrow based gait; romberg is negative    DIAGNOSTIC DATA (LABS, IMAGING, TESTING)  I reviewed patient records, labs, notes, testing and imaging myself where available. -[facility]  06/20/11 CT sinus - no significant sinus disease; mild bulging of left nasal concha   09/16/11 MRI brain - left CP angle arachnoid cyst, otherwise normal   09/09/11 ESR, CRP - normal    ASSESSMENT: 74 y.o. female here with pulmonary sarcoidosis, sleep apnea, previously evaluated for intermittent headaches (migraine variant).   Now s/p fall and head trauma Aug 20, 2014. With intermittent right hand fingertip numbness recently. Ongoing headaches and neck pain.  Dx: post-traumatic headache/migraine + possible TIA (right hand numbness; left neck pain and recent trauma raise concern to rule out  left carotid dissection associated TIA)  PLAN:   Orders Placed This Encounter  Procedures  . MR Brain Wo Contrast  . MR Cervical Spine Wo Contrast  . US Carotid Bilateral  . 2D Echocardiogram without contrast     Return in about 6 weeks (around 10/31/2014).   Penni Bombard, MD 5/91/0289, 02:28 AM Certified in Neurology, Neurophysiology and Neuroimaging  [facility] [address] [place], [place] [address] [phone]

## 2014-09-19 NOTE — Patient Instructions (Signed)
Try prednisone 6 day tapering course.  I will check MRI scans, ultrasound (neck and heart).

## 2014-09-29 ENCOUNTER — Ambulatory Visit (HOSPITAL_COMMUNITY): Payer: Medicare Other | Attending: Cardiovascular Disease | Admitting: Cardiology

## 2014-09-29 ENCOUNTER — Other Ambulatory Visit (HOSPITAL_COMMUNITY): Payer: Self-pay | Admitting: Cardiology

## 2014-09-29 DIAGNOSIS — G459 Transient cerebral ischemic attack, unspecified: Secondary | ICD-10-CM | POA: Diagnosis not present

## 2014-09-29 DIAGNOSIS — R202 Paresthesia of skin: Secondary | ICD-10-CM | POA: Insufficient documentation

## 2014-09-29 DIAGNOSIS — R200 Anesthesia of skin: Secondary | ICD-10-CM

## 2014-09-29 NOTE — Progress Notes (Signed)
Echo performed. 

## 2014-10-01 ENCOUNTER — Other Ambulatory Visit (HOSPITAL_COMMUNITY): Payer: Medicare Other

## 2014-10-10 ENCOUNTER — Ambulatory Visit
Admission: RE | Admit: 2014-10-10 | Discharge: 2014-10-10 | Disposition: A | Discharge status: Home / Self Care | Payer: Medicare Other | Source: Ambulatory Visit | Attending: Diagnostic Neuroimaging | Admitting: Diagnostic Neuroimaging

## 2014-10-10 DIAGNOSIS — R200 Anesthesia of skin: Secondary | ICD-10-CM

## 2014-10-10 DIAGNOSIS — M542 Cervicalgia: Secondary | ICD-10-CM

## 2014-10-10 DIAGNOSIS — G44301 Post-traumatic headache, unspecified, intractable: Secondary | ICD-10-CM

## 2014-10-10 DIAGNOSIS — R202 Paresthesia of skin: Secondary | ICD-10-CM | POA: Diagnosis not present

## 2014-10-10 DIAGNOSIS — G459 Transient cerebral ischemic attack, unspecified: Secondary | ICD-10-CM | POA: Diagnosis not present

## 2014-10-17 ENCOUNTER — Other Ambulatory Visit: Payer: Self-pay | Admitting: Internal Medicine

## 2014-11-10 ENCOUNTER — Encounter: Payer: Self-pay | Admitting: Diagnostic Neuroimaging

## 2014-11-10 ENCOUNTER — Ambulatory Visit (INDEPENDENT_AMBULATORY_CARE_PROVIDER_SITE_OTHER): Payer: Medicare Other | Admitting: Diagnostic Neuroimaging

## 2014-11-10 VITALS — BP 116/72 | HR 60 | Ht 63.5 in | Wt 156.4 lb

## 2014-11-10 DIAGNOSIS — G44309 Post-traumatic headache, unspecified, not intractable: Secondary | ICD-10-CM | POA: Diagnosis not present

## 2014-11-10 NOTE — Patient Instructions (Signed)
Continue current medications. 

## 2014-11-10 NOTE — Progress Notes (Signed)
PATIENT: Elizabeth Gray DOB: 1940/10/09  REASON FOR VISIT: follow up HISTORY FROM: patient  Chief Complaint  Patient presents with  . Follow-up    Headache     HISTORY OF PRESENT ILLNESS:  UPDATE 11/10/14: Since last visit, doing much better. HA are much improved. On TPX $Remo'100mg'KwWzo$  qhs. Prednisone course seemed to have helped.  UPDATE 09/19/14: Since last visit patient was doing well. She had knee replacement surgery in October 2015. Following this patient had forgot to restart topiramate. She continues to do well without headaches. Unfortunately 08/28/2014, patient had a fall when she was helping unload wood from pickup truck, fell backwards and struck her head. She had pain in the back of her head. She was able to finish her work. 2 days later she had recurrence of her prior migraine headaches. She describes left frontal throbbing severe headaches with sensitivity to light and sound. She has been taking Tylenol arthritis strength, 2 tabs twice a day for several weeks. She is also taking topiramate 100 mg twice a day again. This week headaches were slightly better. She rates headaches as 5 out of 10. Also a few days ago patient noticed intermittent tingling in her right hand fingertips, digits 2, 3, 5. Symptoms last 10-20 minutes at a time. She also noticed some discoloration in her right third digit distally, lasting a few minutes. Patient also having some intermittent left-sided neck pain, radiating to left arm.  UPDATE 03/10/14 (LL): Patient is tolerating tinnitus with TPX 100 mg bid for less frequent, less severe headaches. No other known side effects. Pleased with current treatment. Planning right total knee replacement in October.  UPDATE 09/09/13: Since last visit, was doing well until she developed tinnitus. Her PCP advised her to reduce TPX. She reduced to $RemoveBe'50mg'ILuZAqCIn$  BID, then to $Remov'25mg'mFXYwo$  BID. Tinnitus improved, but then HA worsened. Now back to $Remov'100mg'Rjbczy$  BID, but tinnitus has returned. 2 weeks ago had  migraine (HA + nausea), took tylenol and went to sleep with resolution of HA.   UPDATE 09/07/12: Was doing better, then more HA x last 2 months. Not sleeping well at night. Wakes up multiple times to check on husband (who falls asleep in chair). Not much physical activity. HA are similar quality as before.   UPDATE 03/05/12: Doing better. No headaches since last visit. She has had noticed floaters that appear like a feather; notices in am when she may not have had enough sleep, last episode 2 weeks ago. She has a history of floaters since age 32. Tolerating TPX $RemoveBefore'100mg'TmzANkKQrHSCv$  qhs without any memory or focus problems or numbness/tingling.   UPDATE 10/31/11: Doing little bit better. On TPX $Remo'100mg'LEaFz$  qhs; couldn't tolerate BID dosing. No side effects. Sinus pressure sensation has improved.   PRIOR HPI: 74 year old ambidextrous, right dominant, female with history of pulmonary sarcoidosis, obstructive sleep apnea, osteoarthritis, here for evaluation of headaches since October 2012. Patient reports feeling sinus infection symptoms and frontal headache in October 2012. She was prescribed azithromycin. Her symptoms did not improve. She was then referred to ENT, had CT of the sinuses, which showed no significant sinus disease. For the past 2 months she's been taking tramadol and ibuprofen on a daily basis for knee pain. At the onset of the headache symptom she was taking Sudafed but no other pain medications.  Patient reports pressure and mild throbbing sensation in the bifrontal and bitemporal regions. No nausea, vomiting, photophobia or visual scotoma. Sometimes she has mild blurred vision and phonophobia with her headaches.  Headache severity is 2/10 on a daily basis, sometimes increased to 4 or 5/10 once a week.   REVIEW OF SYSTEMS: Full 14 system review of systems performed and notable only for light sens ringing inears runny nose restless legs apnea freq waking daytime sleepiness neck pain joint pain headache back  pain.  ALLERGIES: Allergies  Allergen Reactions  . Metoclopramide Hcl Other (See Comments)    REGLAN="burning in mouth"  . Oxymetazoline Hcl Itching    AFRIN SPRAY  . Sucralfate Other (See Comments)    CARAFATE=Mouth sores    HOME MEDICATIONS: Outpatient Prescriptions Prior to Visit  Medication Sig Dispense Refill  . Alum Hydroxide-Mag Carbonate (GAVISCON PO) Take 1 tablet by mouth as needed (reflux). As directed as needed    . aspirin 81 MG tablet Take 81 mg by mouth daily.    . beclomethasone (QVAR) 40 MCG/ACT inhaler Inhale 2 puffs into the lungs 2 (two) times daily.    . Cholecalciferol (VITAMIN D PO) Take 1,000 Units by mouth daily.     . cyanocobalamin (,VITAMIN B-12,) 1000 MCG/ML injection Inject 1,000 mcg into the muscle every 14 (fourteen) days.    . Flaxseed, Linseed, (FLAXSEED OIL) OIL Take 1 capsule by mouth 2 (two) times daily.      Marland Kitchen GLUCOSAMINE-CHONDROITIN-MSM-D3 PO Take 1 tablet by mouth 2 (two) times daily.    Marland Kitchen levothyroxine (SYNTHROID, LEVOTHROID) 100 MCG tablet Take 100 mcg by mouth daily before breakfast.    . magnesium oxide (MAG-OX) 400 MG tablet Take 400 mg by mouth at bedtime.    . Multiple Vitamins-Minerals (ICAPS AREDS FORMULA PO) Take 1 capsule by mouth daily.      Marland Kitchen omeprazole-sodium bicarbonate (ZEGERID) 40-1100 MG per capsule TAKE 1 CAPSULE BY MOUTH DAILY BEFORE BREAKFAST. 30 capsule 2  . OVER THE COUNTER MEDICATION Take 5 tablets by mouth 2 (two) times daily. Supplement - [place]    . Pancrelipase, Lip-Prot-Amyl, (CREON) 24000 UNITS CPEP Take 1 capsule (24,000 Units total) by mouth 3 (three) times daily. 270 capsule 3  . Polyethyl Glycol-Propyl Glycol (SYSTANE ULTRA) 0.4-0.3 % SOLN Apply 1 drop to eye as needed (dry eyes).     . topiramate (TOPAMAX) 100 MG tablet Take 100 mg by mouth at bedtime.     . traMADol (ULTRAM) 50 MG tablet Take 100 mg by mouth at bedtime.     . vitamin C (ASCORBIC ACID) 500 MG tablet Take 500 mg by mouth daily.       . methocarbamol (ROBAXIN) 500 MG tablet Take 1 tablet (500 mg total) by mouth 3 (three) times daily as needed. (Patient not taking: Reported on 11/10/2014) 60 tablet 1  . predniSONE (DELTASONE) 10 MG tablet Take 4m on day 1. Reduce by 131meach subsequent day. (60, 50, 40, 30, 20, 10, stop) 21 tablet 0  . zolpidem (AMBIEN) 10 MG tablet Take 0.5 tablets (5 mg total) by mouth at bedtime as needed for sleep. 30 tablet 5   No facility-administered medications prior to visit.    PHYSICAL EXAM Filed Vitals:   11/10/14 1400  BP: 116/72  Pulse: 60  Height: 5' 3.5" (1.613 m)  Weight: 156 lb 6.4 oz (70.943 kg)   Body mass index is 27.27 kg/(m^2).   GENERAL EXAM: Patient is in no distress; well developed, nourished and groomed; neck is supple  CARDIOVASCULAR: Regular rate and rhythm, no murmurs, no carotid bruits  NEUROLOGIC: MENTAL STATUS: awake, alert, language fluent, comprehension intact, naming intact, fund of  knowledge appropriate CRANIAL NERVE: pupils equal and reactive to light, visual fields full to confrontation, extraocular muscles intact, no nystagmus, facial sensation and strength symmetric, hearing intact, palate elevates symmetrically, uvula midline, shoulder shrug symmetric, tongue midline. MOTOR: normal bulk and tone, full strength in the BUE, BLE SENSORY: normal and symmetric to light touch, pinprick, temperature, vibration COORDINATION: finger-nose-finger, fine finger movements normal REFLEXES: deep tendon reflexes present and symmetric GAIT/STATION: narrow based gait; romberg is negative    DIAGNOSTIC DATA (LABS, IMAGING, TESTING)  06/20/11 CT sinus - no significant sinus disease; mild bulging of left nasal concha   09/16/11 MRI brain - left CP angle arachnoid cyst, otherwise normal   09/09/11 ESR, CRP - normal   10/10/14 MRI brain - normal   10/10/14 MRI cervical spine -  degenerative changes at C5-C6, C6-C7 and T1-T2. At C5-C6 there is uncovertebral spurring,  mild disc protrusion and facet hypertrophy. However, the neural foramen just mildly narrowed and there is no nerve root impingement. The degenerative changes at C6-C7 and T1 T2 are milder and there is no nerve root impingement.  10/10/14 carotid u/s - normal     ASSESSMENT: 74 y.o. female here with pulmonary sarcoidosis, sleep apnea, previously evaluated for intermittent headaches (migraine variant).   S/p fall and head trauma Aug 20, 2014. With intermittent right hand fingertip numbness recently. Ongoing headaches and neck pain --> Now resolved.  Dx: post-traumatic headache/migraine   PLAN:  - continue TPX  Return in about 6 months (around 05/12/2015).   I spent 10 minutes of face to face time with patient. Greater than 50% of time was spent in counseling and coordination of care with patient.   Penni Bombard, MD 0/[id number]/9767, 3:41 PM Certified in Neurology, Neurophysiology and Neuroimaging  [facility] [address], [place], [place] [address] [phone]

## 2014-11-13 ENCOUNTER — Other Ambulatory Visit: Payer: Self-pay | Admitting: Diagnostic Neuroimaging

## 2014-11-13 NOTE — Telephone Encounter (Signed)
Last OV note says: On TPX 100mg  qhs.

## 2015-01-01 ENCOUNTER — Other Ambulatory Visit: Payer: Self-pay | Admitting: Internal Medicine

## 2015-01-02 DIAGNOSIS — H40003 Preglaucoma, unspecified, bilateral: Secondary | ICD-10-CM | POA: Diagnosis not present

## 2015-01-07 ENCOUNTER — Other Ambulatory Visit: Payer: Self-pay | Admitting: Internal Medicine

## 2015-01-14 ENCOUNTER — Other Ambulatory Visit: Payer: Self-pay | Admitting: Internal Medicine

## 2015-01-30 ENCOUNTER — Encounter: Payer: Self-pay | Admitting: Internal Medicine

## 2015-01-30 ENCOUNTER — Ambulatory Visit (INDEPENDENT_AMBULATORY_CARE_PROVIDER_SITE_OTHER): Payer: Medicare Other | Admitting: Internal Medicine

## 2015-01-30 ENCOUNTER — Other Ambulatory Visit (INDEPENDENT_AMBULATORY_CARE_PROVIDER_SITE_OTHER): Payer: Medicare Other

## 2015-01-30 VITALS — BP 138/72 | HR 64 | Temp 97.6°F | Wt 154.0 lb

## 2015-01-30 DIAGNOSIS — E034 Atrophy of thyroid (acquired): Secondary | ICD-10-CM

## 2015-01-30 DIAGNOSIS — E538 Deficiency of other specified B group vitamins: Secondary | ICD-10-CM

## 2015-01-30 DIAGNOSIS — E038 Other specified hypothyroidism: Secondary | ICD-10-CM

## 2015-01-30 DIAGNOSIS — Z79899 Other long term (current) drug therapy: Secondary | ICD-10-CM

## 2015-01-30 DIAGNOSIS — E559 Vitamin D deficiency, unspecified: Secondary | ICD-10-CM

## 2015-01-30 DIAGNOSIS — Z0000 Encounter for general adult medical examination without abnormal findings: Secondary | ICD-10-CM

## 2015-01-30 DIAGNOSIS — R739 Hyperglycemia, unspecified: Secondary | ICD-10-CM

## 2015-01-30 LAB — CBC WITH DIFFERENTIAL/PLATELET
Basophils Absolute: 0 10*3/uL (ref 0.0–0.1)
Basophils Relative: 0.4 % (ref 0.0–3.0)
EOS ABS: 0.2 10*3/uL (ref 0.0–0.7)
Eosinophils Relative: 3 % (ref 0.0–5.0)
HEMATOCRIT: 39 % (ref 36.0–46.0)
Hemoglobin: 13.1 g/dL (ref 12.0–15.0)
Lymphocytes Relative: 33.3 % (ref 12.0–46.0)
Lymphs Abs: 2.5 10*3/uL (ref 0.7–4.0)
MCHC: 33.5 g/dL (ref 30.0–36.0)
MCV: 86.8 fl (ref 78.0–100.0)
Monocytes Absolute: 0.7 10*3/uL (ref 0.1–1.0)
Monocytes Relative: 9.2 % (ref 3.0–12.0)
NEUTROS ABS: 4.1 10*3/uL (ref 1.4–7.7)
Neutrophils Relative %: 54.1 % (ref 43.0–77.0)
PLATELETS: 329 10*3/uL (ref 150.0–400.0)
RBC: 4.49 Mil/uL (ref 3.87–5.11)
RDW: 13.8 % (ref 11.5–15.5)
WBC: 7.6 10*3/uL (ref 4.0–10.5)

## 2015-01-30 LAB — URINALYSIS
Bilirubin Urine: NEGATIVE
HGB URINE DIPSTICK: NEGATIVE
KETONES UR: NEGATIVE
LEUKOCYTES UA: NEGATIVE
Nitrite: NEGATIVE
Specific Gravity, Urine: 1.015 (ref 1.000–1.030)
Total Protein, Urine: NEGATIVE
URINE GLUCOSE: NEGATIVE
Urobilinogen, UA: 0.2 (ref 0.0–1.0)
pH: 8 (ref 5.0–8.0)

## 2015-01-30 LAB — BASIC METABOLIC PANEL
BUN: 21 mg/dL (ref 6–23)
CALCIUM: 9.5 mg/dL (ref 8.4–10.5)
CO2: 26 mEq/L (ref 19–32)
Chloride: 106 mEq/L (ref 96–112)
Creatinine, Ser: 0.78 mg/dL (ref 0.40–1.20)
GFR: 76.28 mL/min (ref >60.00)
Glucose, Bld: 99 mg/dL (ref 70–99)
POTASSIUM: 4.3 meq/L (ref 3.5–5.1)
Sodium: 142 mEq/L (ref 135–145)

## 2015-01-30 LAB — LIPID PANEL
CHOL/HDL RATIO: 2
Cholesterol: 136 mg/dL (ref 0–200)
HDL: 63.8 mg/dL (ref >39.00)
LDL Cholesterol: 56 mg/dL (ref 0–99)
NONHDL: 72.2
Triglycerides: 83 mg/dL (ref 0.0–149.0)
VLDL: 16.6 mg/dL (ref 0.0–40.0)

## 2015-01-30 LAB — VITAMIN D 25 HYDROXY (VIT D DEFICIENCY, FRACTURES): VITD: 55.65 ng/mL (ref 30.00–100.00)

## 2015-01-30 LAB — HEPATIC FUNCTION PANEL
ALBUMIN: 4.2 g/dL (ref 3.5–5.2)
ALK PHOS: 69 U/L (ref 39–117)
ALT: 16 U/L (ref 0–35)
AST: 19 U/L (ref 0–37)
BILIRUBIN DIRECT: 0.1 mg/dL (ref 0.0–0.3)
TOTAL PROTEIN: 7.6 g/dL (ref 6.0–8.3)
Total Bilirubin: 0.4 mg/dL (ref 0.2–1.2)

## 2015-01-30 LAB — TSH: TSH: 0.32 u[IU]/mL — ABNORMAL LOW (ref 0.35–4.50)

## 2015-01-30 LAB — VITAMIN B12: Vitamin B-12: 384 pg/mL (ref 211–911)

## 2015-01-30 MED ORDER — ZOLPIDEM TARTRATE 5 MG PO TABS
5.0000 mg | ORAL_TABLET | Freq: Every evening | ORAL | Status: DC | PRN
Start: 2015-01-30 — End: 2015-08-07

## 2015-01-30 NOTE — Progress Notes (Signed)
   Subjective:    HPI  The patient is here for a wellness exam. The patient has been doing well overall without major physical or psychological issues going on lately. Working 3d/week   The patient presents for a follow-up of  chronic hypertension, insomnia, allergies, B12 deficiency controlled with medicines  Wt Readings from Last 3 Encounters:  01/30/15 154 lb (69.854 kg)  11/10/14 156 lb 6.4 oz (70.943 kg)  09/19/14 154 lb 9.6 oz (70.126 kg)   BP Readings from Last 3 Encounters:  01/30/15 138/72  11/10/14 116/72  09/19/14 113/71     Review of Systems  Constitutional: Negative for chills, activity change, appetite change, fatigue and unexpected weight change.  HENT: Negative for congestion, mouth sores, rhinorrhea, sinus pressure and voice change.   Eyes: Negative for visual disturbance.  Respiratory: Negative for cough and chest tightness.   Gastrointestinal: Negative for nausea and abdominal pain.  Genitourinary: Negative for frequency, difficulty urinating and vaginal pain.  Musculoskeletal: Positive for arthralgias. Negative for back pain and gait problem.  Skin: Negative for pallor and rash.  Neurological: Negative for dizziness, tremors, facial asymmetry, weakness, numbness and headaches.  Hematological: Does not bruise/bleed easily.  Psychiatric/Behavioral: Negative for hallucinations, confusion, sleep disturbance, self-injury and decreased concentration. The patient is nervous/anxious.        Objective:   Physical Exam  Constitutional: She appears well-developed and well-nourished. No distress.  HENT:  Head: Normocephalic.  Right Ear: External ear normal.  Left Ear: External ear normal.  Nose: Nose normal.  Mouth/Throat: Oropharynx is clear and moist.  Eyes: Conjunctivae are normal. Pupils are equal, round, and reactive to light. Right eye exhibits no discharge. Left eye exhibits no discharge.  Neck: Normal range of motion. Neck supple. No JVD present. No  tracheal deviation present. No thyromegaly present.  Cardiovascular: Normal rate, regular rhythm and normal heart sounds.   Pulmonary/Chest: No stridor. No respiratory distress. She has no wheezes.  Abdominal: Soft. Bowel sounds are normal. She exhibits no mass. There is no tenderness. There is no rebound and no guarding.  Musculoskeletal: She exhibits no edema or tenderness.  Lymphadenopathy:    She has no cervical adenopathy.  Neurological: She displays normal reflexes. No cranial nerve deficit. She exhibits normal muscle tone. Coordination normal.  Skin: No rash noted. No erythema.  Psychiatric: Her behavior is normal. Judgment and thought content normal.  R knee is less tender w/ROM, scar  Lab Results  Component Value Date   WBC 9.4 07/28/2014   HGB 12.6 07/28/2014   HCT 39.2 07/28/2014   PLT 392.0 07/28/2014   GLUCOSE 91 07/28/2014   CHOL 150 01/27/2014   TRIG 100.0 01/27/2014   HDL 66.90 01/27/2014   LDLCALC 63 01/27/2014   ALT 16 07/28/2014   AST 21 07/28/2014   NA 142 07/28/2014   K 4.6 07/28/2014   CL 104 07/28/2014   CREATININE 0.7 07/28/2014   BUN 18 07/28/2014   CO2 32 07/28/2014   TSH 0.71 07/28/2014   INR 3.03* 05/12/2014    BDS  [facility] Screening - OK     Assessment & Plan:

## 2015-01-30 NOTE — Assessment & Plan Note (Signed)
On B12 

## 2015-01-30 NOTE — Patient Instructions (Signed)
Preventive Care for Adults A healthy lifestyle and preventive care can promote health and wellness. Preventive health guidelines for women include the following key practices.  A routine yearly physical is a good way to check with your health care provider about your health and preventive screening. It is a chance to share any concerns and updates on your health and to receive a thorough exam.  Visit your dentist for a routine exam and preventive care every 6 months. Brush your teeth twice a day and floss once a day. Good oral hygiene prevents tooth decay and gum disease.  The frequency of eye exams is based on your age, health, family medical history, use of contact lenses, and other factors. Follow your health care provider's recommendations for frequency of eye exams.  Eat a healthy diet. Foods like vegetables, fruits, whole grains, low-fat dairy products, and lean protein foods contain the nutrients you need without too many calories. Decrease your intake of foods high in solid fats, added sugars, and salt. Eat the right amount of calories for you.Get information about a proper diet from your health care provider, if necessary.  Regular physical exercise is one of the most important things you can do for your health. Most adults should get at least 150 minutes of moderate-intensity exercise (any activity that increases your heart rate and causes you to sweat) each week. In addition, most adults need muscle-strengthening exercises on 2 or more days a week.  Maintain a healthy weight. The body mass index (BMI) is a screening tool to identify possible weight problems. It provides an estimate of body fat based on height and weight. Your health care provider can find your BMI and can help you achieve or maintain a healthy weight.For adults 20 years and older:  A BMI below 18.5 is considered underweight.  A BMI of 18.5 to 24.9 is normal.  A BMI of 25 to 29.9 is considered overweight.  A BMI of  30 and above is considered obese.  Maintain normal blood lipids and cholesterol levels by exercising and minimizing your intake of saturated fat. Eat a balanced diet with plenty of fruit and vegetables. Blood tests for lipids and cholesterol should begin at age 76 and be repeated every 5 years. If your lipid or cholesterol levels are high, you are over 50, or you are at high risk for heart disease, you may need your cholesterol levels checked more frequently.Ongoing high lipid and cholesterol levels should be treated with medicines if diet and exercise are not working.  If you smoke, find out from your health care provider how to quit. If you do not use tobacco, do not start.  Lung cancer screening is recommended for adults aged 22-80 years who are at high risk for developing lung cancer because of a history of smoking. A yearly low-dose CT scan of the lungs is recommended for people who have at least a 30-pack-year history of smoking and are a current smoker or have quit within the past 15 years. A pack year of smoking is smoking an average of 1 pack of cigarettes a day for 1 year (for example: 1 pack a day for 30 years or 2 packs a day for 15 years). Yearly screening should continue until the smoker has stopped smoking for at least 15 years. Yearly screening should be stopped for people who develop a health problem that would prevent them from having lung cancer treatment.  If you are pregnant, do not drink alcohol. If you are breastfeeding,  be very cautious about drinking alcohol. If you are not pregnant and choose to drink alcohol, do not have more than 1 drink per day. One drink is considered to be 12 ounces (355 mL) of beer, 5 ounces (148 mL) of wine, or 1.5 ounces (44 mL) of liquor.  Avoid use of street drugs. Do not share needles with anyone. Ask for help if you need support or instructions about stopping the use of drugs.  High blood pressure causes heart disease and increases the risk of  stroke. Your blood pressure should be checked at least every 1 to 2 years. Ongoing high blood pressure should be treated with medicines if weight loss and exercise do not work.  If you are 3-86 years old, ask your health care provider if you should take aspirin to prevent strokes.  Diabetes screening involves taking a blood sample to check your fasting blood sugar level. This should be done once every 3 years, after age 67, if you are within normal weight and without risk factors for diabetes. Testing should be considered at a younger age or be carried out more frequently if you are overweight and have at least 1 risk factor for diabetes.  Breast cancer screening is essential preventive care for women. You should practice "breast self-awareness." This means understanding the normal appearance and feel of your breasts and may include breast self-examination. Any changes detected, no matter how small, should be reported to a health care provider. Women in their 8s and 30s should have a clinical breast exam (CBE) by a health care provider as part of a regular health exam every 1 to 3 years. After age 70, women should have a CBE every year. Starting at age 25, women should consider having a mammogram (breast X-ray test) every year. Women who have a family history of breast cancer should talk to their health care provider about genetic screening. Women at a high risk of breast cancer should talk to their health care providers about having an MRI and a mammogram every year.  Breast cancer gene (BRCA)-related cancer risk assessment is recommended for women who have family members with BRCA-related cancers. BRCA-related cancers include breast, ovarian, tubal, and peritoneal cancers. Having family members with these cancers may be associated with an increased risk for harmful changes (mutations) in the breast cancer genes BRCA1 and BRCA2. Results of the assessment will determine the need for genetic counseling and  BRCA1 and BRCA2 testing.  Routine pelvic exams to screen for cancer are no longer recommended for nonpregnant women who are considered low risk for cancer of the pelvic organs (ovaries, uterus, and vagina) and who do not have symptoms. Ask your health care provider if a screening pelvic exam is right for you.  If you have had past treatment for cervical cancer or a condition that could lead to cancer, you need Pap tests and screening for cancer for at least 20 years after your treatment. If Pap tests have been discontinued, your risk factors (such as having a new sexual partner) need to be reassessed to determine if screening should be resumed. Some women have medical problems that increase the chance of getting cervical cancer. In these cases, your health care provider may recommend more frequent screening and Pap tests.  The HPV test is an additional test that may be used for cervical cancer screening. The HPV test looks for the virus that can cause the cell changes on the cervix. The cells collected during the Pap test can be  tested for HPV. The HPV test could be used to screen women aged 30 years and older, and should be used in women of any age who have unclear Pap test results. After the age of 30, women should have HPV testing at the same frequency as a Pap test.  Colorectal cancer can be detected and often prevented. Most routine colorectal cancer screening begins at the age of 50 years and continues through age 75 years. However, your health care provider may recommend screening at an earlier age if you have risk factors for colon cancer. On a yearly basis, your health care provider may provide home test kits to check for hidden blood in the stool. Use of a small camera at the end of a tube, to directly examine the colon (sigmoidoscopy or colonoscopy), can detect the earliest forms of colorectal cancer. Talk to your health care provider about this at age 50, when routine screening begins. Direct  exam of the colon should be repeated every 5-10 years through age 75 years, unless early forms of pre-cancerous polyps or small growths are found.  People who are at an increased risk for hepatitis B should be screened for this virus. You are considered at high risk for hepatitis B if:  You were born in a country where hepatitis B occurs often. Talk with your health care provider about which countries are considered high risk.  Your parents were born in a high-risk country and you have not received a shot to protect against hepatitis B (hepatitis B vaccine).  You have HIV or AIDS.  You use needles to inject street drugs.  You live with, or have sex with, someone who has hepatitis B.  You get hemodialysis treatment.  You take certain medicines for conditions like cancer, organ transplantation, and autoimmune conditions.  Hepatitis C blood testing is recommended for all people born from 1945 through 1965 and any individual with known risks for hepatitis C.  Practice safe sex. Use condoms and avoid high-risk sexual practices to reduce the spread of sexually transmitted infections (STIs). STIs include gonorrhea, chlamydia, syphilis, trichomonas, herpes, HPV, and human immunodeficiency virus (HIV). Herpes, HIV, and HPV are viral illnesses that have no cure. They can result in disability, cancer, and death.  You should be screened for sexually transmitted illnesses (STIs) including gonorrhea and chlamydia if:  You are sexually active and are younger than 24 years.  You are older than 24 years and your health care provider tells you that you are at risk for this type of infection.  Your sexual activity has changed since you were last screened and you are at an increased risk for chlamydia or gonorrhea. Ask your health care provider if you are at risk.  If you are at risk of being infected with HIV, it is recommended that you take a prescription medicine daily to prevent HIV infection. This is  called preexposure prophylaxis (PrEP). You are considered at risk if:  You are a heterosexual woman, are sexually active, and are at increased risk for HIV infection.  You take drugs by injection.  You are sexually active with a partner who has HIV.  Talk with your health care provider about whether you are at high risk of being infected with HIV. If you choose to begin PrEP, you should first be tested for HIV. You should then be tested every 3 months for as long as you are taking PrEP.  Osteoporosis is a disease in which the bones lose minerals and strength   with aging. This can result in serious bone fractures or breaks. The risk of osteoporosis can be identified using a bone density scan. Women ages 65 years and over and women at risk for fractures or osteoporosis should discuss screening with their health care providers. Ask your health care provider whether you should take a calcium supplement or vitamin D to reduce the rate of osteoporosis.  Menopause can be associated with physical symptoms and risks. Hormone replacement therapy is available to decrease symptoms and risks. You should talk to your health care provider about whether hormone replacement therapy is right for you.  Use sunscreen. Apply sunscreen liberally and repeatedly throughout the day. You should seek shade when your shadow is shorter than you. Protect yourself by wearing long sleeves, pants, a wide-brimmed hat, and sunglasses year round, whenever you are outdoors.  Once a month, do a whole body skin exam, using a mirror to look at the skin on your back. Tell your health care provider of new moles, moles that have irregular borders, moles that are larger than a pencil eraser, or moles that have changed in shape or color.  Stay current with required vaccines (immunizations).  Influenza vaccine. All adults should be immunized every year.  Tetanus, diphtheria, and acellular pertussis (Td, Tdap) vaccine. Pregnant women should  receive 1 dose of Tdap vaccine during each pregnancy. The dose should be obtained regardless of the length of time since the last dose. Immunization is preferred during the 27th-36th week of gestation. An adult who has not previously received Tdap or who does not know her vaccine status should receive 1 dose of Tdap. This initial dose should be followed by tetanus and diphtheria toxoids (Td) booster doses every 10 years. Adults with an unknown or incomplete history of completing a 3-dose immunization series with Td-containing vaccines should begin or complete a primary immunization series including a Tdap dose. Adults should receive a Td booster every 10 years.  Varicella vaccine. An adult without evidence of immunity to varicella should receive 2 doses or a second dose if she has previously received 1 dose. Pregnant females who do not have evidence of immunity should receive the first dose after pregnancy. This first dose should be obtained before leaving the health care facility. The second dose should be obtained 4-8 weeks after the first dose.  Human papillomavirus (HPV) vaccine. Females aged 13-26 years who have not received the vaccine previously should obtain the 3-dose series. The vaccine is not recommended for use in pregnant females. However, pregnancy testing is not needed before receiving a dose. If a female is found to be pregnant after receiving a dose, no treatment is needed. In that case, the remaining doses should be delayed until after the pregnancy. Immunization is recommended for any person with an immunocompromised condition through the age of 26 years if she did not get any or all doses earlier. During the 3-dose series, the second dose should be obtained 4-8 weeks after the first dose. The third dose should be obtained 24 weeks after the first dose and 16 weeks after the second dose.  Zoster vaccine. One dose is recommended for adults aged 60 years or older unless certain conditions are  present.  Measles, mumps, and rubella (MMR) vaccine. Adults born before 1957 generally are considered immune to measles and mumps. Adults born in 1957 or later should have 1 or more doses of MMR vaccine unless there is a contraindication to the vaccine or there is laboratory evidence of immunity to   each of the three diseases. A routine second dose of MMR vaccine should be obtained at least 28 days after the first dose for students attending postsecondary schools, health care workers, or international travelers. People who received inactivated measles vaccine or an unknown type of measles vaccine during 1963-1967 should receive 2 doses of MMR vaccine. People who received inactivated mumps vaccine or an unknown type of mumps vaccine before 1979 and are at high risk for mumps infection should consider immunization with 2 doses of MMR vaccine. For females of childbearing age, rubella immunity should be determined. If there is no evidence of immunity, females who are not pregnant should be vaccinated. If there is no evidence of immunity, females who are pregnant should delay immunization until after pregnancy. Unvaccinated health care workers born before 1957 who lack laboratory evidence of measles, mumps, or rubella immunity or laboratory confirmation of disease should consider measles and mumps immunization with 2 doses of MMR vaccine or rubella immunization with 1 dose of MMR vaccine.  Pneumococcal 13-valent conjugate (PCV13) vaccine. When indicated, a person who is uncertain of her immunization history and has no record of immunization should receive the PCV13 vaccine. An adult aged 19 years or older who has certain medical conditions and has not been previously immunized should receive 1 dose of PCV13 vaccine. This PCV13 should be followed with a dose of pneumococcal polysaccharide (PPSV23) vaccine. The PPSV23 vaccine dose should be obtained at least 8 weeks after the dose of PCV13 vaccine. An adult aged 19  years or older who has certain medical conditions and previously received 1 or more doses of PPSV23 vaccine should receive 1 dose of PCV13. The PCV13 vaccine dose should be obtained 1 or more years after the last PPSV23 vaccine dose.  Pneumococcal polysaccharide (PPSV23) vaccine. When PCV13 is also indicated, PCV13 should be obtained first. All adults aged 65 years and older should be immunized. An adult younger than age 65 years who has certain medical conditions should be immunized. Any person who resides in a nursing home or long-term care facility should be immunized. An adult smoker should be immunized. People with an immunocompromised condition and certain other conditions should receive both PCV13 and PPSV23 vaccines. People with human immunodeficiency virus (HIV) infection should be immunized as soon as possible after diagnosis. Immunization during chemotherapy or radiation therapy should be avoided. Routine use of PPSV23 vaccine is not recommended for American Indians, [place] Natives, or people younger than 65 years unless there are medical conditions that require PPSV23 vaccine. When indicated, people who have unknown immunization and have no record of immunization should receive PPSV23 vaccine. One-time revaccination 5 years after the first dose of PPSV23 is recommended for people aged 19-64 years who have chronic kidney failure, nephrotic syndrome, asplenia, or immunocompromised conditions. People who received 1-2 doses of PPSV23 before age 65 years should receive another dose of PPSV23 vaccine at age 65 years or later if at least 5 years have passed since the previous dose. Doses of PPSV23 are not needed for people immunized with PPSV23 at or after age 65 years.  Meningococcal vaccine. Adults with asplenia or persistent complement component deficiencies should receive 2 doses of quadrivalent meningococcal conjugate (MenACWY-D) vaccine. The doses should be obtained at least 2 months apart.  Microbiologists working with certain meningococcal bacteria, military recruits, people at risk during an outbreak, and people who travel to or live in countries with a high rate of meningitis should be immunized. A first-year college student up through age   21 years who is living in a residence hall should receive a dose if she did not receive a dose on or after her 16th birthday. Adults who have certain high-risk conditions should receive one or more doses of vaccine.  Hepatitis A vaccine. Adults who wish to be protected from this disease, have certain high-risk conditions, work with hepatitis A-infected animals, work in hepatitis A research labs, or travel to or work in countries with a high rate of hepatitis A should be immunized. Adults who were previously unvaccinated and who anticipate close contact with an international adoptee during the first 60 days after arrival in the [place] from a country with a high rate of hepatitis A should be immunized.  Hepatitis B vaccine. Adults who wish to be protected from this disease, have certain high-risk conditions, may be exposed to blood or other infectious body fluids, are household contacts or sex partners of hepatitis B positive people, are clients or workers in certain care facilities, or travel to or work in countries with a high rate of hepatitis B should be immunized.  Haemophilus influenzae type b (Hib) vaccine. A previously unvaccinated person with asplenia or sickle cell disease or having a scheduled splenectomy should receive 1 dose of Hib vaccine. Regardless of previous immunization, a recipient of a hematopoietic stem cell transplant should receive a 3-dose series 6-12 months after her successful transplant. Hib vaccine is not recommended for adults with HIV infection. Preventive Services / Frequency Ages 64 to 68 years  Blood pressure check.** / Every 1 to 2 years.  Lipid and cholesterol check.** / Every 5 years beginning at age  22.  Clinical breast exam.** / Every 3 years for women in their 88s and 53s.  BRCA-related cancer risk assessment.** / For women who have family members with a BRCA-related cancer (breast, ovarian, tubal, or peritoneal cancers).  Pap test.** / Every 2 years from ages 90 through 51. Every 3 years starting at age 21 through age 56 or 3 with a history of 3 consecutive normal Pap tests.  HPV screening.** / Every 3 years from ages 24 through ages 1 to 46 with a history of 3 consecutive normal Pap tests.  Hepatitis C blood test.** / For any individual with known risks for hepatitis C.  Skin self-exam. / Monthly.  Influenza vaccine. / Every year.  Tetanus, diphtheria, and acellular pertussis (Tdap, Td) vaccine.** / Consult your health care provider. Pregnant women should receive 1 dose of Tdap vaccine during each pregnancy. 1 dose of Td every 10 years.  Varicella vaccine.** / Consult your health care provider. Pregnant females who do not have evidence of immunity should receive the first dose after pregnancy.  HPV vaccine. / 3 doses over 6 months, if 72 and younger. The vaccine is not recommended for use in pregnant females. However, pregnancy testing is not needed before receiving a dose.  Measles, mumps, rubella (MMR) vaccine.** / You need at least 1 dose of MMR if you were born in 1957 or later. You may also need a 2nd dose. For females of childbearing age, rubella immunity should be determined. If there is no evidence of immunity, females who are not pregnant should be vaccinated. If there is no evidence of immunity, females who are pregnant should delay immunization until after pregnancy.  Pneumococcal 13-valent conjugate (PCV13) vaccine.** / Consult your health care provider.  Pneumococcal polysaccharide (PPSV23) vaccine.** / 1 to 2 doses if you smoke cigarettes or if you have certain conditions.  Meningococcal vaccine.** /  1 dose if you are age 19 to 21 years and a first-year college  student living in a residence hall, or have one of several medical conditions, you need to get vaccinated against meningococcal disease. You may also need additional booster doses.  Hepatitis A vaccine.** / Consult your health care provider.  Hepatitis B vaccine.** / Consult your health care provider.  Haemophilus influenzae type b (Hib) vaccine.** / Consult your health care provider. Ages 40 to 64 years  Blood pressure check.** / Every 1 to 2 years.  Lipid and cholesterol check.** / Every 5 years beginning at age 20 years.  Lung cancer screening. / Every year if you are aged 55-80 years and have a 30-pack-year history of smoking and currently smoke or have quit within the past 15 years. Yearly screening is stopped once you have quit smoking for at least 15 years or develop a health problem that would prevent you from having lung cancer treatment.  Clinical breast exam.** / Every year after age 40 years.  BRCA-related cancer risk assessment.** / For women who have family members with a BRCA-related cancer (breast, ovarian, tubal, or peritoneal cancers).  Mammogram.** / Every year beginning at age 40 years and continuing for as long as you are in good health. Consult with your health care provider.  Pap test.** / Every 3 years starting at age 30 years through age 65 or 70 years with a history of 3 consecutive normal Pap tests.  HPV screening.** / Every 3 years from ages 30 years through ages 65 to 70 years with a history of 3 consecutive normal Pap tests.  Fecal occult blood test (FOBT) of stool. / Every year beginning at age 50 years and continuing until age 75 years. You may not need to do this test if you get a colonoscopy every 10 years.  Flexible sigmoidoscopy or colonoscopy.** / Every 5 years for a flexible sigmoidoscopy or every 10 years for a colonoscopy beginning at age 50 years and continuing until age 75 years.  Hepatitis C blood test.** / For all people born from 1945 through  1965 and any individual with known risks for hepatitis C.  Skin self-exam. / Monthly.  Influenza vaccine. / Every year.  Tetanus, diphtheria, and acellular pertussis (Tdap/Td) vaccine.** / Consult your health care provider. Pregnant women should receive 1 dose of Tdap vaccine during each pregnancy. 1 dose of Td every 10 years.  Varicella vaccine.** / Consult your health care provider. Pregnant females who do not have evidence of immunity should receive the first dose after pregnancy.  Zoster vaccine.** / 1 dose for adults aged 60 years or older.  Measles, mumps, rubella (MMR) vaccine.** / You need at least 1 dose of MMR if you were born in 1957 or later. You may also need a 2nd dose. For females of childbearing age, rubella immunity should be determined. If there is no evidence of immunity, females who are not pregnant should be vaccinated. If there is no evidence of immunity, females who are pregnant should delay immunization until after pregnancy.  Pneumococcal 13-valent conjugate (PCV13) vaccine.** / Consult your health care provider.  Pneumococcal polysaccharide (PPSV23) vaccine.** / 1 to 2 doses if you smoke cigarettes or if you have certain conditions.  Meningococcal vaccine.** / Consult your health care provider.  Hepatitis A vaccine.** / Consult your health care provider.  Hepatitis B vaccine.** / Consult your health care provider.  Haemophilus influenzae type b (Hib) vaccine.** / Consult your health care provider. Ages 65   years and over  Blood pressure check.** / Every 1 to 2 years.  Lipid and cholesterol check.** / Every 5 years beginning at age 22 years.  Lung cancer screening. / Every year if you are aged 73-80 years and have a 30-pack-year history of smoking and currently smoke or have quit within the past 15 years. Yearly screening is stopped once you have quit smoking for at least 15 years or develop a health problem that would prevent you from having lung cancer  treatment.  Clinical breast exam.** / Every year after age 4 years.  BRCA-related cancer risk assessment.** / For women who have family members with a BRCA-related cancer (breast, ovarian, tubal, or peritoneal cancers).  Mammogram.** / Every year beginning at age 40 years and continuing for as long as you are in good health. Consult with your health care provider.  Pap test.** / Every 3 years starting at age 9 years through age 34 or 91 years with 3 consecutive normal Pap tests. Testing can be stopped between 65 and 70 years with 3 consecutive normal Pap tests and no abnormal Pap or HPV tests in the past 10 years.  HPV screening.** / Every 3 years from ages 57 years through ages 64 or 45 years with a history of 3 consecutive normal Pap tests. Testing can be stopped between 65 and 70 years with 3 consecutive normal Pap tests and no abnormal Pap or HPV tests in the past 10 years.  Fecal occult blood test (FOBT) of stool. / Every year beginning at age 15 years and continuing until age 17 years. You may not need to do this test if you get a colonoscopy every 10 years.  Flexible sigmoidoscopy or colonoscopy.** / Every 5 years for a flexible sigmoidoscopy or every 10 years for a colonoscopy beginning at age 86 years and continuing until age 71 years.  Hepatitis C blood test.** / For all people born from [birth year] through 1967 and any individual with known risks for hepatitis C.  Osteoporosis screening.** / A one-time screening for women ages 83 years and over and women at risk for fractures or osteoporosis.  Skin self-exam. / Monthly.  Influenza vaccine. / Every year.  Tetanus, diphtheria, and acellular pertussis (Tdap/Td) vaccine.** / 1 dose of Td every 10 years.  Varicella vaccine.** / Consult your health care provider.  Zoster vaccine.** / 1 dose for adults aged 61 years or older.  Pneumococcal 13-valent conjugate (PCV13) vaccine.** / Consult your health care provider.  Pneumococcal  polysaccharide (PPSV23) vaccine.** / 1 dose for all adults aged 28 years and older.  Meningococcal vaccine.** / Consult your health care provider.  Hepatitis A vaccine.** / Consult your health care provider.  Hepatitis B vaccine.** / Consult your health care provider.  Haemophilus influenzae type b (Hib) vaccine.** / Consult your health care provider. ** Family history and personal history of risk and conditions may change your health care provider's recommendations. Document Released: 09/13/2001 Document Revised: 12/02/2013 Document Reviewed: 12/13/2010 [facility] Patient Information 2015 [place], [place]. This information is not intended to replace advice given to you by your health care provider. Make sure you discuss any questions you have with your health care provider.

## 2015-01-30 NOTE — Assessment & Plan Note (Signed)
On Vit D Labs 

## 2015-01-30 NOTE — Assessment & Plan Note (Signed)
Labs

## 2015-01-30 NOTE — Assessment & Plan Note (Signed)

## 2015-01-30 NOTE — Progress Notes (Signed)
Pre visit review using our clinic review tool, if applicable. No additional management support is needed unless otherwise documented below in the visit note. 

## 2015-02-23 ENCOUNTER — Ambulatory Visit (INDEPENDENT_AMBULATORY_CARE_PROVIDER_SITE_OTHER): Payer: Medicare Other | Admitting: Critical Care Medicine

## 2015-02-23 ENCOUNTER — Encounter: Payer: Self-pay | Admitting: Critical Care Medicine

## 2015-02-23 VITALS — BP 124/62 | HR 67 | Temp 97.5°F | Ht 63.5 in | Wt 157.6 lb

## 2015-02-23 DIAGNOSIS — Z9989 Dependence on other enabling machines and devices: Secondary | ICD-10-CM

## 2015-02-23 DIAGNOSIS — G4733 Obstructive sleep apnea (adult) (pediatric): Secondary | ICD-10-CM

## 2015-02-23 DIAGNOSIS — D869 Sarcoidosis, unspecified: Secondary | ICD-10-CM

## 2015-02-23 DIAGNOSIS — Z471 Aftercare following joint replacement surgery: Secondary | ICD-10-CM | POA: Diagnosis not present

## 2015-02-23 DIAGNOSIS — Z96651 Presence of right artificial knee joint: Secondary | ICD-10-CM | POA: Diagnosis not present

## 2015-02-23 DIAGNOSIS — M1711 Unilateral primary osteoarthritis, right knee: Secondary | ICD-10-CM | POA: Diagnosis not present

## 2015-02-23 DIAGNOSIS — M542 Cervicalgia: Secondary | ICD-10-CM | POA: Diagnosis not present

## 2015-02-23 MED ORDER — BECLOMETHASONE DIPROPIONATE 40 MCG/ACT IN AERS: 2.0000 | INHALATION_SPRAY | Freq: Two times a day (BID) | RESPIRATORY_TRACT | Status: DC

## 2015-02-23 NOTE — Assessment & Plan Note (Signed)
Stable osa on cpap 12cmh20.  Pt is compliant with therapy.   Plan Cont cpap 12 Obtain download report

## 2015-02-23 NOTE — Patient Instructions (Signed)
Stay on Cpap, stop by [facility] with your cpap machine for a download  Refill on qvar sent Return 1 year with Dr Elsworth Soho

## 2015-02-23 NOTE — Assessment & Plan Note (Signed)
Stable sarcoidosis on inhaled steroids Cont qvar

## 2015-02-23 NOTE — Progress Notes (Signed)
Subjective:    Patient ID: Elizabeth Gray, female    DOB: January 21, 1941, 74 y.o.   MRN: [id number]  HPI 02/23/2015 Chief Complaint  Patient presents with  . Yearly Follow up    Breathing doing well overall.  No SOB, wheezing, chest tightness/CP, or cough.  Wearing cpap everynight - no problems with mask or pressure.   Pt on cpap 12cmh20.  Full face mask, no oxygen, heated humidity. Pt is compliant. No issues with cpap.  Dyspnea is stable. No real cough or mucus.  Pt denies any significant sore throat, nasal congestion or excess secretions, fever, chills, sweats, unintended weight loss, pleurtic or exertional chest pain, orthopnea PND, or leg swelling Pt denies any increase in rescue therapy over baseline, denies waking up needing it or having any early am or nocturnal exacerbations of coughing/wheezing/or dyspnea. Pt also denies any obvious fluctuation in symptoms with  weather or environmental change or other alleviating or aggravating factors   Current Medications, Allergies, Complete Past Medical History, Past Surgical History, Family History, and Social History were reviewed in [facility] record per todays encounter:  02/23/2015  Review of Systems  Constitutional: Negative.   HENT: Negative.  Negative for ear pain, postnasal drip, rhinorrhea, sinus pressure, sore throat, trouble swallowing and voice change.   Eyes: Negative.   Respiratory: Negative.  Negative for apnea, cough, choking, chest tightness, shortness of breath, wheezing and stridor.   Cardiovascular: Negative.  Negative for chest pain, palpitations and leg swelling.  Gastrointestinal: Negative.  Negative for nausea, vomiting, abdominal pain and abdominal distention.  Genitourinary: Negative.   Musculoskeletal: Negative.  Negative for myalgias and arthralgias.  Skin: Negative.  Negative for rash.  Allergic/Immunologic: Negative.  Negative for environmental allergies and food allergies.    Neurological: Negative.  Negative for dizziness, syncope, weakness and headaches.  Hematological: Negative.  Negative for adenopathy. Does not bruise/bleed easily.  Psychiatric/Behavioral: Negative.  Negative for sleep disturbance and agitation. The patient is not nervous/anxious.        Objective:   Physical Exam Filed Vitals:   02/23/15 1127  BP: 124/62  Pulse: 67  Temp: 97.5 F (36.4 C)  TempSrc: Oral  Height: 5' 3.5" (1.613 m)  Weight: 157 lb 9.6 oz (71.487 kg)  SpO2: 97%    Gen: Pleasant, well-nourished, in no distress,  normal affect  ENT: No lesions,  mouth clear,  oropharynx clear, no postnasal drip  Neck: No JVD, no TMG, no carotid bruits  Lungs: No use of accessory muscles, no dullness to percussion, clear without rales or rhonchi  Cardiovascular: RRR, heart sounds normal, no murmur or gallops, no peripheral edema  Abdomen: soft and NT, no HSM,  BS normal  Musculoskeletal: No deformities, no cyanosis or clubbing  Neuro: alert, non focal  Skin: Warm, no lesions or rashes  No results found.        Assessment & Plan:  I personally reviewed all images and lab data in the [facility] system as well as any outside material available during this office visit and agree with the  radiology impressions.   OSA on CPAP Stable osa on cpap 12cmh20.  Pt is compliant with therapy.   Plan Cont cpap 12 Obtain download report  SARCOIDOSIS, PULMONARY Stable sarcoidosis on inhaled steroids Cont qvar   Sukaina was seen today for yearly follow up.  Diagnoses and all orders for this visit:  OSA on CPAP  SARCOIDOSIS, PULMONARY  Other orders -     beclomethasone (QVAR) 40  MCG/ACT inhaler; Inhale 2 puffs into the lungs 2 (two) times daily.

## 2015-03-16 ENCOUNTER — Telehealth: Payer: Self-pay | Admitting: Critical Care Medicine

## 2015-03-16 NOTE — Telephone Encounter (Signed)
Called, spoke with pt.  Discussed results per Dr. Joya Gaskins.  Pt verbalized understanding and voiced no further questions or concerns at this time.

## 2015-03-16 NOTE — Telephone Encounter (Signed)
Let pt know download shows good compliance and good result with setting of 12cmh20 on cpap

## 2015-04-04 ENCOUNTER — Other Ambulatory Visit: Payer: Self-pay | Admitting: Internal Medicine

## 2015-04-11 ENCOUNTER — Other Ambulatory Visit: Payer: Self-pay | Admitting: Internal Medicine

## 2015-05-05 DIAGNOSIS — Z23 Encounter for immunization: Secondary | ICD-10-CM | POA: Diagnosis not present

## 2015-05-11 ENCOUNTER — Encounter: Payer: Self-pay | Admitting: Diagnostic Neuroimaging

## 2015-05-11 ENCOUNTER — Ambulatory Visit (INDEPENDENT_AMBULATORY_CARE_PROVIDER_SITE_OTHER): Payer: Medicare Other | Admitting: Diagnostic Neuroimaging

## 2015-05-11 VITALS — BP 137/75 | HR 65 | Ht 63.5 in | Wt 160.4 lb

## 2015-05-11 DIAGNOSIS — G44309 Post-traumatic headache, unspecified, not intractable: Secondary | ICD-10-CM

## 2015-05-11 DIAGNOSIS — G43009 Migraine without aura, not intractable, without status migrainosus: Secondary | ICD-10-CM | POA: Diagnosis not present

## 2015-05-11 MED ORDER — TOPIRAMATE 100 MG PO TABS: 100.0000 mg | ORAL_TABLET | Freq: Every day | ORAL | Status: DC

## 2015-05-11 NOTE — Progress Notes (Signed)
PATIENT: Elizabeth Gray DOB: [birth year]  REASON FOR VISIT: follow up HISTORY FROM: patient  Chief Complaint  Patient presents with  . post traumatic haeadache    rm 6  . Follow-up    6 month    HISTORY OF PRESENT ILLNESS:  UPDATE 05/11/15: Since last visit, overall stable. Sometimes with standing or moving too quickly, triggers vertigo attacks. For now, symptoms are mild.   UPDATE 11/10/14: Since last visit, doing much better. HA are much improved. On TPX $Remo'100mg'ovQKu$  qhs. Prednisone course seemed to have helped.  UPDATE 09/19/14: Since last visit patient was doing well. She had knee replacement surgery in October 2015. Following this patient had forgot to restart topiramate. She continues to do well without headaches. Unfortunately 08/28/2014, patient had a fall when she was helping unload wood from pickup truck, fell backwards and struck her head. She had pain in the back of her head. She was able to finish her work. 2 days later she had recurrence of her prior migraine headaches. She describes left frontal throbbing severe headaches with sensitivity to light and sound. She has been taking Tylenol arthritis strength, 2 tabs twice a day for several weeks. She is also taking topiramate 100 mg twice a day again. This week headaches were slightly better. She rates headaches as 5 out of 10. Also a few days ago patient noticed intermittent tingling in her right hand fingertips, digits 2, 3, 5. Symptoms last 10-20 minutes at a time. She also noticed some discoloration in her right third digit distally, lasting a few minutes. Patient also having some intermittent left-sided neck pain, radiating to left arm.  UPDATE 03/10/14 (LL): Patient is tolerating tinnitus with TPX 100 mg bid for less frequent, less severe headaches. No other known side effects. Pleased with current treatment. Planning right total knee replacement in October.  UPDATE 09/09/13: Since last visit, was doing well until she developed  tinnitus. Her PCP advised her to reduce TPX. She reduced to $RemoveBe'50mg'iWBatGIWh$  BID, then to $Remov'25mg'JAvDHq$  BID. Tinnitus improved, but then HA worsened. Now back to $Remov'100mg'hAeVyu$  BID, but tinnitus has returned. 2 weeks ago had migraine (HA + nausea), took tylenol and went to sleep with resolution of HA.   UPDATE 09/07/12: Was doing better, then more HA x last 2 months. Not sleeping well at night. Wakes up multiple times to check on husband (who falls asleep in chair). Not much physical activity. HA are similar quality as before.   UPDATE 03/05/12: Doing better. No headaches since last visit. She has had noticed floaters that appear like a feather; notices in am when she may not have had enough sleep, last episode 2 weeks ago. She has a history of floaters since age 74. Tolerating TPX $RemoveBefore'100mg'yCyHmnUUIUjEx$  qhs without any memory or focus problems or numbness/tingling.   UPDATE 10/31/11: Doing little bit better. On TPX $Remo'100mg'Fxtcu$  qhs; couldn't tolerate BID dosing. No side effects. Sinus pressure sensation has improved.   PRIOR HPI: 74 year old ambidextrous, right dominant, female with history of pulmonary sarcoidosis, obstructive sleep apnea, osteoarthritis, here for evaluation of headaches since October 2012. Patient reports feeling sinus infection symptoms and frontal headache in October 2012. She was prescribed azithromycin. Her symptoms did not improve. She was then referred to ENT, had CT of the sinuses, which showed no significant sinus disease. For the past 2 months she's been taking tramadol and ibuprofen on a daily basis for knee pain. At the onset of the headache symptom she was taking Sudafed but no other  pain medications.  Patient reports pressure and mild throbbing sensation in the bifrontal and bitemporal regions. No nausea, vomiting, photophobia or visual scotoma. Sometimes she has mild blurred vision and phonophobia with her headaches. Headache severity is 2/10 on a daily basis, sometimes increased to 4 or 5/10 once a week.   REVIEW OF SYSTEMS: Full  14 system review of systems performed and notable only for hearing loss ringing in ears eye redness light sens headaches restless legs apnea.    ALLERGIES: Allergies  Allergen Reactions  . Metoclopramide Hcl Other (See Comments)    REGLAN="burning in mouth"  . Oxymetazoline Hcl Itching    AFRIN SPRAY  . Sucralfate Other (See Comments)    CARAFATE=Mouth sores    HOME MEDICATIONS: Outpatient Prescriptions Prior to Visit  Medication Sig Dispense Refill  . Alum Hydroxide-Mag Carbonate (GAVISCON PO) Take 1 tablet by mouth as needed (reflux). As directed as needed    . aspirin 81 MG tablet Take 81 mg by mouth daily.    . beclomethasone (QVAR) 40 MCG/ACT inhaler Inhale 2 puffs into the lungs 2 (two) times daily. 1 Inhaler 11  . Cholecalciferol (VITAMIN D PO) Take 1,000 Units by mouth daily.     . cyanocobalamin (,VITAMIN B-12,) 1000 MCG/ML injection Inject 1,000 mcg into the muscle every 14 (fourteen) days.    . Flaxseed, Linseed, (FLAXSEED OIL) OIL Take 1 capsule by mouth 2 (two) times daily.      Marland Kitchen GLUCOSAMINE-CHONDROITIN-MSM-D3 PO Take 1 tablet by mouth 2 (two) times daily.    Marland Kitchen levothyroxine (SYNTHROID, LEVOTHROID) 100 MCG tablet TAKE 1 TABLET EVERY DAY 90 tablet 3  . magnesium oxide (MAG-OX) 400 MG tablet Take 400 mg by mouth at bedtime.    . Multiple Vitamins-Minerals (ICAPS AREDS FORMULA PO) Take 1 capsule by mouth daily.      Marland Kitchen omeprazole-sodium bicarbonate (ZEGERID) 40-1100 MG per capsule TAKE 1 CAPSULE BY MOUTH DAILY BEFORE BREAKFAST. 30 capsule 2  . OVER THE COUNTER MEDICATION Take 5 tablets by mouth 2 (two) times daily. Supplement - [place]    . Pancrelipase, Lip-Prot-Amyl, (CREON) 24000 UNITS CPEP Take 1 capsule (24,000 Units total) by mouth 3 (three) times daily. 270 capsule 3  . Polyethyl Glycol-Propyl Glycol (SYSTANE ULTRA) 0.4-0.3 % SOLN Apply 1 drop to eye as needed (dry eyes).     . topiramate (TOPAMAX) 100 MG tablet Take 1 tablet (100 mg total) by mouth at  bedtime. 90 tablet 3  . traMADol (ULTRAM) 50 MG tablet Take 100 mg by mouth at bedtime.     . vitamin C (ASCORBIC ACID) 500 MG tablet Take 500 mg by mouth daily.      Marland Kitchen zolpidem (AMBIEN) 5 MG tablet Take 1 tablet (5 mg total) by mouth at bedtime as needed. for sleep 30 tablet 5  . levothyroxine (SYNTHROID, LEVOTHROID) 100 MCG tablet Take 100 mcg by mouth daily before breakfast.    . methocarbamol (ROBAXIN) 500 MG tablet Take 1 tablet (500 mg total) by mouth 3 (three) times daily as needed. 60 tablet 1   No facility-administered medications prior to visit.    PHYSICAL EXAM Filed Vitals:   05/11/15 1003  BP: 137/75  Pulse: 65  Height: 5' 3.5" (1.613 m)  Weight: 160 lb 6.4 oz (72.757 kg)   Body mass index is 27.96 kg/(m^2).   GENERAL EXAM: Patient is in no distress; well developed, nourished and groomed; neck is supple  CARDIOVASCULAR: Regular rate and rhythm, no murmurs, no carotid  bruits  NEUROLOGIC: MENTAL STATUS: awake, alert, language fluent, comprehension intact, naming intact, fund of knowledge appropriate CRANIAL NERVE: pupils equal and reactive to light, visual fields full to confrontation, extraocular muscles intact, no nystagmus, facial sensation and strength symmetric, hearing intact, palate elevates symmetrically, uvula midline, shoulder shrug symmetric, tongue midline. MOTOR: normal bulk and tone, full strength in the BUE, BLE SENSORY: normal and symmetric to light touch, temperature, vibration COORDINATION: finger-nose-finger, fine finger movements normal REFLEXES: deep tendon reflexes present and symmetric GAIT/STATION: narrow based gait; romberg is negative    DIAGNOSTIC DATA (LABS, IMAGING, TESTING)  06/20/11 CT sinus - no significant sinus disease; mild bulging of left nasal concha   09/16/11 MRI brain - left CP angle arachnoid cyst, otherwise normal   09/09/11 ESR, CRP - normal   10/10/14 MRI brain - normal   10/10/14 MRI cervical spine -  degenerative  changes at C5-C6, C6-C7 and T1-T2. At C5-C6 there is uncovertebral spurring, mild disc protrusion and facet hypertrophy. However, the neural foramen just mildly narrowed and there is no nerve root impingement. The degenerative changes at C6-C7 and T1 T2 are milder and there is no nerve root impingement.  10/10/14 carotid u/s - normal     ASSESSMENT: 74 y.o. female here with pulmonary sarcoidosis, sleep apnea, previously evaluated for intermittent headaches (migraine variant).   S/p fall and head trauma Aug 20, 2014. With intermittent right hand fingertip numbness recently. Ongoing headaches and neck pain --> Now resolved.  Dx: post-traumatic headache/migraine    PLAN:  I spent 15 minutes of face to face time with patient. Greater than 50% of time was spent in counseling and coordination of care with patient. In summary we discussed:  - continue TPX $RemoveBefo'100mg'GyEQWOzWVWc$  qhs - may consider vestibular PT in future if positional vertigo increases  Meds ordered this encounter  Medications  . topiramate (TOPAMAX) 100 MG tablet    Sig: Take 1 tablet (100 mg total) by mouth at bedtime.    Dispense:  90 tablet    Refill:  4   Return in about 1 year (around 05/10/2016).     Penni Bombard, MD 08/81/1031, 59:45 AM Certified in Neurology, Neurophysiology and Neuroimaging  [facility] [address], [place], [place] [address] [phone]

## 2015-05-11 NOTE — Patient Instructions (Signed)

## 2015-06-30 ENCOUNTER — Other Ambulatory Visit: Payer: Self-pay | Admitting: Internal Medicine

## 2015-07-03 ENCOUNTER — Other Ambulatory Visit: Payer: Self-pay | Admitting: Internal Medicine

## 2015-07-29 ENCOUNTER — Other Ambulatory Visit: Payer: Self-pay

## 2015-07-29 MED ORDER — OMEPRAZOLE-SODIUM BICARBONATE 40-1100 MG PO CAPS: ORAL_CAPSULE | ORAL | Status: DC

## 2015-07-29 NOTE — Telephone Encounter (Signed)
Sent in 90 day supply as requested.

## 2015-08-07 ENCOUNTER — Ambulatory Visit (INDEPENDENT_AMBULATORY_CARE_PROVIDER_SITE_OTHER): Payer: Medicare Other | Admitting: Internal Medicine

## 2015-08-07 ENCOUNTER — Other Ambulatory Visit (INDEPENDENT_AMBULATORY_CARE_PROVIDER_SITE_OTHER): Payer: Medicare Other

## 2015-08-07 ENCOUNTER — Encounter: Payer: Self-pay | Admitting: Internal Medicine

## 2015-08-07 VITALS — BP 122/80 | HR 70 | Temp 98.7°F | Wt 161.0 lb

## 2015-08-07 DIAGNOSIS — E034 Atrophy of thyroid (acquired): Secondary | ICD-10-CM

## 2015-08-07 DIAGNOSIS — E559 Vitamin D deficiency, unspecified: Secondary | ICD-10-CM | POA: Diagnosis not present

## 2015-08-07 DIAGNOSIS — Z0000 Encounter for general adult medical examination without abnormal findings: Secondary | ICD-10-CM | POA: Diagnosis not present

## 2015-08-07 DIAGNOSIS — E038 Other specified hypothyroidism: Secondary | ICD-10-CM | POA: Diagnosis not present

## 2015-08-07 DIAGNOSIS — E538 Deficiency of other specified B group vitamins: Secondary | ICD-10-CM

## 2015-08-07 DIAGNOSIS — F519 Sleep disorder not due to a substance or known physiological condition, unspecified: Secondary | ICD-10-CM

## 2015-08-07 LAB — TSH: TSH: 0.33 u[IU]/mL — ABNORMAL LOW (ref 0.35–4.50)

## 2015-08-07 LAB — BASIC METABOLIC PANEL
BUN: 20 mg/dL (ref 6–23)
CO2: 27 mEq/L (ref 19–32)
Calcium: 9.8 mg/dL (ref 8.4–10.5)
Chloride: 106 mEq/L (ref 96–112)
Creatinine, Ser: 0.76 mg/dL (ref 0.40–1.20)
GFR: 78.49 mL/min (ref >60.00)
GLUCOSE: 99 mg/dL (ref 70–99)
POTASSIUM: 4.1 meq/L (ref 3.5–5.1)
SODIUM: 143 meq/L (ref 135–145)

## 2015-08-07 MED ORDER — ZOLPIDEM TARTRATE 5 MG PO TABS: 5.0000 mg | ORAL_TABLET | Freq: Every evening | ORAL | Status: DC | PRN

## 2015-08-07 MED ORDER — AZITHROMYCIN 250 MG PO TABS: ORAL_TABLET | ORAL | Status: DC

## 2015-08-07 NOTE — Progress Notes (Signed)
Subjective:  Patient ID: Elizabeth Gray, female    DOB: July 13, 1941  Age: 75 y.o. MRN: [id number]  CC: No chief complaint on file.   HPI [name] presents for HTN, dyslipidemia, anxiety f/u. C/o sinusitis sx's x 3 weeks - worse  Outpatient Prescriptions Prior to Visit  Medication Sig Dispense Refill  . Alum Hydroxide-Mag Carbonate (GAVISCON PO) Take 1 tablet by mouth as needed (reflux). As directed as needed    . aspirin 81 MG tablet Take 81 mg by mouth daily.    . beclomethasone (QVAR) 40 MCG/ACT inhaler Inhale 2 puffs into the lungs 2 (two) times daily. 1 Inhaler 11  . Cholecalciferol (VITAMIN D PO) Take 1,000 Units by mouth daily.     Marland Kitchen CREON 24000 UNITS CPEP TAKE 1 CAPSULE (24,000 UNITS TOTAL) BY MOUTH 3 (THREE) TIMES DAILY. 270 capsule 2  . cyanocobalamin (,VITAMIN B-12,) 1000 MCG/ML injection Inject 1,000 mcg into the muscle every 14 (fourteen) days.    . Flaxseed, Linseed, (FLAXSEED OIL) OIL Take 1 capsule by mouth 2 (two) times daily.      Marland Kitchen GLUCOSAMINE-CHONDROITIN-MSM-D3 PO Take 1 tablet by mouth 2 (two) times daily.    Marland Kitchen levothyroxine (SYNTHROID, LEVOTHROID) 100 MCG tablet TAKE 1 TABLET EVERY DAY 90 tablet 3  . magnesium oxide (MAG-OX) 400 MG tablet Take 400 mg by mouth at bedtime.    . Multiple Vitamins-Minerals (ICAPS AREDS FORMULA PO) Take 1 capsule by mouth daily.      Marland Kitchen omeprazole-sodium bicarbonate (ZEGERID) 40-1100 MG capsule TAKE 1 CAPSULE BY MOUTH DAILY BEFORE BREAKFAST. 90 capsule 0  . OVER THE COUNTER MEDICATION Take 5 tablets by mouth 2 (two) times daily. Supplement - [place]    . Polyethyl Glycol-Propyl Glycol (SYSTANE ULTRA) 0.4-0.3 % SOLN Apply 1 drop to eye as needed (dry eyes).     . topiramate (TOPAMAX) 100 MG tablet Take 1 tablet (100 mg total) by mouth at bedtime. 90 tablet 4  . traMADol (ULTRAM) 50 MG tablet Take 100 mg by mouth at bedtime.     . vitamin C (ASCORBIC ACID) 500 MG tablet Take 500 mg by mouth daily.      Marland Kitchen zolpidem  (AMBIEN) 5 MG tablet Take 1 tablet (5 mg total) by mouth at bedtime as needed. for sleep 30 tablet 5   No facility-administered medications prior to visit.    ROS Review of Systems  Constitutional: Negative for chills, activity change, appetite change, fatigue and unexpected weight change.  HENT: Positive for congestion, rhinorrhea, sinus pressure and sore throat. Negative for mouth sores.   Eyes: Negative for visual disturbance.  Respiratory: Negative for cough and chest tightness.   Gastrointestinal: Negative for nausea and abdominal pain.  Genitourinary: Negative for frequency, difficulty urinating and vaginal pain.  Musculoskeletal: Negative for back pain and gait problem.  Skin: Negative for pallor and rash.  Neurological: Negative for dizziness, tremors, weakness, numbness and headaches.  Psychiatric/Behavioral: Positive for sleep disturbance. Negative for confusion.    Objective:  BP 122/80 mmHg  Pulse 70  Temp(Src) 98.7 F (37.1 C) (Oral)  Wt 161 lb (73.029 kg)  SpO2 96%  BP Readings from Last 3 Encounters:  08/07/15 122/80  05/11/15 137/75  02/23/15 124/62    Wt Readings from Last 3 Encounters:  08/07/15 161 lb (73.029 kg)  05/11/15 160 lb 6.4 oz (72.757 kg)  02/23/15 157 lb 9.6 oz (71.487 kg)    Physical Exam  Constitutional: She appears well-developed. No distress.  HENT:  Head: Normocephalic.  Right Ear: External ear normal.  Left Ear: External ear normal.  Nose: Nose normal.  Mouth/Throat: Oropharynx is clear and moist.  Eyes: Conjunctivae are normal. Pupils are equal, round, and reactive to light. Right eye exhibits no discharge. Left eye exhibits no discharge.  Neck: Normal range of motion. Neck supple. No JVD present. No tracheal deviation present. No thyromegaly present.  Cardiovascular: Normal rate, regular rhythm and normal heart sounds.   Pulmonary/Chest: No stridor. No respiratory distress. She has no wheezes.  Abdominal: Soft. Bowel sounds are  normal. She exhibits no distension and no mass. There is no tenderness. There is no rebound and no guarding.  Musculoskeletal: She exhibits no edema or tenderness.  Lymphadenopathy:    She has no cervical adenopathy.  Neurological: She displays normal reflexes. No cranial nerve deficit. She exhibits normal muscle tone. Coordination normal.  Skin: No rash noted. No erythema.  Psychiatric: She has a normal mood and affect. Her behavior is normal. Judgment and thought content normal.  swollen nasal lining  Lab Results  Component Value Date   WBC 7.6 01/30/2015   HGB 13.1 01/30/2015   HCT 39.0 01/30/2015   PLT 329.0 01/30/2015   GLUCOSE 99 01/30/2015   CHOL 136 01/30/2015   TRIG 83.0 01/30/2015   HDL 63.80 01/30/2015   LDLCALC 56 01/30/2015   ALT 16 01/30/2015   AST 19 01/30/2015   NA 142 01/30/2015   K 4.3 01/30/2015   CL 106 01/30/2015   CREATININE 0.78 01/30/2015   BUN 21 01/30/2015   CO2 26 01/30/2015   TSH 0.32* 01/30/2015   INR 3.03* 05/12/2014    Mr Brain Wo Contrast  10/10/2014   [facility] [address] [place], [place] [address] [phone] NEUROIMAGING REPORT STUDY DATE: 10/10/2014 PATIENT NAME: Elizabeth Gray DOB: 1940/12/25 MRN: [id number] EXAM: MRI Brain without contrast ORDERING CLINICIAN: Andrey Spearman CLINICAL HISTORY: 75 year old woman with headaches following a fall COMPARISON FILMS: None TECHNIQUE: MRI of the brain without contrast was obtained utilizing 5 mm axial slices with T1, T2, T2 flair, SWI and diffusion weighted views.  T1 sagittal and T2 coronal views were obtained. CONTRAST: none IMAGING SITE: [facility], [place], [place] FINDINGS: On sagittal images, the spinal cord is imaged caudally to C3 and is normal in caliber.   The contents of the posterior fossa are of normal size and position.   The pituitary gland and optic chiasm appear normal.    Brain volume appears normal for age.   The ventricles are  normal in size and without distortion.  There are no abnormal extra-axial collections of fluid.  The cerebellum and brainstem appears normal.   The deep gray matter appears normal.  The cerebral hemispheres appear normal for age.  The orbits appear normal.   The VIIth/VIIIth nerve complex appears normal.  The mastoid air cells appear normal.  The paranasal sinuses appear normal.  Incidental note of a left concha bullosa within the middle turbinate.  Flow voids are identified within the major intracerebral arteries.   Diffusion weighted images are normal.  Susceptibility weighted images are normal.    10/10/2014   This is a normal age-appropriate MRI of the brain without contrast. No acute findings. INTERPRETING PHYSICIAN: Richard A. Felecia Shelling, MD, PhD Certified in  Neuroimaging by [place] [employer]   Mr Cervical Spine Wo Contrast  10/10/2014   [facility] [address] [place], [place] [address] [phone] NEUROIMAGING  REPORT STUDY DATE: 10/10/2014 PATIENT NAME: ODIS RUSNAK DOB: June 16, 1941 MRN: [id number] EXAM: MRI of the cervical spine without contrast ORDERING CLINICIAN: Vikram Penumalli CLINICAL HISTORY: 75 year old woman who has experienced right arm numbness since a fall. COMPARISON FILMS: None TECHNIQUE: MRI of the cervical spine was obtained utilizing 3 mm sagittal slices from the posterior fossa down to the T3-4 level with T1, T2 and inversion recovery views. In addition 4 mm axial slices from [id number] down to T1-2 level were included with T2 and gradient echo views. CONTRAST: None IMAGING SITE: [facility],  [address] FINDINGS: :  On sagittal images, the spine is imaged from above the cervicomedullary junction to T2.   The spinal cord is of normal caliber and signal.   The vertebral bodies are normally aligned.   The vertebral bodies have normal signal.  There is moderately severe loss of disc height at C5-C6 and mild loss of disc height at C6-C7.  The discs and interspaces were further evaluated on axial views from C2 to T1 as follows: C2 - C3:  The disc and interspace appear normal. C3 - C4:  The disc and interspace appear normal. C4 - C5:  The disc and interspace appear normal. C5 - C6:  There is bilateral uncovertebral spurring and mild disc protrusion.  Mild facet hypertrophy is noted. Both neural foramina are mildly narrowed. There does not appear to be any definite nerve root impingement. C6 - C7:  There is mild broad disc protrusion. The neural foramina just minimally narrowed and there does not appear to be any nerve root impingement.. C7 - T1:  The disc and interspace appear normal. T1 - T2:  There is a tiny right paramedian disc protrusion that does not impinge any other neural structures.   10/10/2014   This is an abnormal MRI of the cervical spine showing degenerative changes at C5-C6, C6-C7 and T1-T2. At C5-C6 there is uncovertebral spurring, mild disc protrusion and facet hypertrophy. However, the neural foramen just mildly narrowed and there is no nerve root impingement. The degenerative changes at C6-C7 and T1 T2 are milder and there is no nerve root impingement. INTERPRETING PHYSICIAN: Richard A. Felecia Shelling, MD, PhD Certified in  Neuroimaging by [place] [employer]   US Carotid Bilateral  10/10/2014  CLINICAL DATA:  Right hand numbness.  Former smoker. EXAM: BILATERAL CAROTID DUPLEX ULTRASOUND TECHNIQUE: Pearline Cables scale imaging, color Doppler and duplex ultrasound were performed of bilateral carotid and vertebral arteries in the neck. COMPARISON:  None. FINDINGS: Criteria: Quantification of carotid stenosis is based on velocity parameters that correlate the residual internal carotid diameter with NASCET-based stenosis levels, using the diameter of the distal internal carotid lumen as the denominator for stenosis measurement. The following velocity measurements were obtained: RIGHT ICA:  94/32 cm/sec CCA:  [id number] cm/sec SYSTOLIC ICA/CCA  RATIO:  0.9 DIASTOLIC ICA/CCA RATIO:  1.4 ECA:  75 cm/sec LEFT ICA:  128/47 cm/sec CCA:  [id number] cm/sec SYSTOLIC ICA/CCA RATIO:  1.3 DIASTOLIC ICA/CCA RATIO:  1.9 ECA:  55 cm/sec RIGHT CAROTID ARTERY: There is no grayscale evidence of significant atherosclerotic plaque or intimal thickening affecting the interrogated portions of the right carotid system. There are no elevated peak systolic velocities within the interrogated course of the right internal carotid artery to suggest a hemodynamically significant stenosis. RIGHT VERTEBRAL ARTERY:  Antegrade flow LEFT CAROTID ARTERY: There is no grayscale evidence of significant intimal thickening or atherosclerotic plaque affecting the interrogated portions of the left carotid system. The left internal carotid  artery is noted to be tortuous. There are no elevated peak systolic velocities within the interrogated course of the left internal carotid artery to suggest a hemodynamically significant stenosis. LEFT VERTEBRAL ARTERY:  Antegrade flow IMPRESSION: Normal carotid Doppler ultrasound. Electronically Signed   By: Sandi Mariscal M.D.   On: 10/10/2014 15:34    Assessment & Plan:   Diagnoses and all orders for this visit:  Well adult exam -     zolpidem (AMBIEN) 5 MG tablet; Take 1 tablet (5 mg total) by mouth at bedtime as needed. for sleep  Vitamin D deficiency -     zolpidem (AMBIEN) 5 MG tablet; Take 1 tablet (5 mg total) by mouth at bedtime as needed. for sleep  Hypothyroidism due to acquired atrophy of thyroid -     zolpidem (AMBIEN) 5 MG tablet; Take 1 tablet (5 mg total) by mouth at bedtime as needed. for sleep   I am having Ms. Risser maintain her Flaxseed Oil, Alum Hydroxide-Mag Carbonate (GAVISCON PO), Multiple Vitamins-Minerals (ICAPS AREDS FORMULA PO), vitamin C, traMADol, Cholecalciferol (VITAMIN D PO), aspirin, Polyethyl Glycol-Propyl Glycol, magnesium oxide, cyanocobalamin, OVER THE COUNTER MEDICATION, GLUCOSAMINE-CHONDROITIN-MSM-D3 PO,  zolpidem, beclomethasone, levothyroxine, topiramate, CREON, and omeprazole-sodium bicarbonate.  No orders of the defined types were placed in this encounter.     Follow-up: No Follow-up on file.  Walker Kehr, MD

## 2015-08-07 NOTE — Assessment & Plan Note (Signed)
On B12 

## 2015-08-07 NOTE — Progress Notes (Signed)
Pre visit review using our clinic review tool, if applicable. No additional management support is needed unless otherwise documented below in the visit note. 

## 2015-08-07 NOTE — Assessment & Plan Note (Signed)
On Vit D 

## 2015-08-07 NOTE — Assessment & Plan Note (Signed)
Chronic  Zolpidem prn  Potential benefits of a long term benzodiazepines  use as well as potential risks  and complications were explained to the patient and were aknowledged. 

## 2015-08-07 NOTE — Assessment & Plan Note (Signed)
On Levothroid 

## 2015-08-14 ENCOUNTER — Encounter: Payer: Self-pay | Admitting: Internal Medicine

## 2015-08-15 ENCOUNTER — Other Ambulatory Visit: Payer: Self-pay | Admitting: Internal Medicine

## 2015-08-15 MED ORDER — CEFUROXIME AXETIL 500 MG PO TABS: 500.0000 mg | ORAL_TABLET | Freq: Two times a day (BID) | ORAL | Status: DC

## 2015-09-04 DIAGNOSIS — Z1231 Encounter for screening mammogram for malignant neoplasm of breast: Secondary | ICD-10-CM | POA: Diagnosis not present

## 2015-11-04 ENCOUNTER — Other Ambulatory Visit: Payer: Self-pay | Admitting: Internal Medicine

## 2015-12-19 ENCOUNTER — Encounter: Payer: Self-pay | Admitting: Internal Medicine

## 2016-01-12 DIAGNOSIS — S86001A Unspecified injury of right Achilles tendon, initial encounter: Secondary | ICD-10-CM | POA: Diagnosis not present

## 2016-01-22 DIAGNOSIS — M25571 Pain in right ankle and joints of right foot: Secondary | ICD-10-CM | POA: Diagnosis not present

## 2016-01-29 DIAGNOSIS — M25571 Pain in right ankle and joints of right foot: Secondary | ICD-10-CM | POA: Diagnosis not present

## 2016-01-29 DIAGNOSIS — H40003 Preglaucoma, unspecified, bilateral: Secondary | ICD-10-CM | POA: Diagnosis not present

## 2016-01-29 DIAGNOSIS — Q688 Other specified congenital musculoskeletal deformities: Secondary | ICD-10-CM | POA: Diagnosis not present

## 2016-02-03 ENCOUNTER — Other Ambulatory Visit: Payer: Self-pay | Admitting: *Deleted

## 2016-02-03 ENCOUNTER — Other Ambulatory Visit: Payer: Self-pay | Admitting: Internal Medicine

## 2016-02-03 MED ORDER — CYANOCOBALAMIN 1000 MCG/ML IJ SOLN: 1000.0000 ug | INTRAMUSCULAR | Status: DC

## 2016-02-05 ENCOUNTER — Encounter: Payer: Medicare Other | Admitting: Internal Medicine

## 2016-02-29 ENCOUNTER — Ambulatory Visit (INDEPENDENT_AMBULATORY_CARE_PROVIDER_SITE_OTHER): Payer: Medicare Other | Admitting: Pulmonary Disease

## 2016-02-29 ENCOUNTER — Other Ambulatory Visit: Payer: Medicare Other

## 2016-02-29 ENCOUNTER — Ambulatory Visit (INDEPENDENT_AMBULATORY_CARE_PROVIDER_SITE_OTHER)
Admission: RE | Admit: 2016-02-29 | Discharge: 2016-02-29 | Disposition: A | Discharge status: Home / Self Care | Payer: Medicare Other | Source: Ambulatory Visit | Attending: Pulmonary Disease | Admitting: Pulmonary Disease

## 2016-02-29 ENCOUNTER — Encounter: Payer: Self-pay | Admitting: Pulmonary Disease

## 2016-02-29 VITALS — BP 120/78 | HR 69 | Ht 63.5 in | Wt 167.2 lb

## 2016-02-29 DIAGNOSIS — G4733 Obstructive sleep apnea (adult) (pediatric): Secondary | ICD-10-CM | POA: Diagnosis not present

## 2016-02-29 DIAGNOSIS — R05 Cough: Secondary | ICD-10-CM | POA: Diagnosis not present

## 2016-02-29 DIAGNOSIS — Z9989 Dependence on other enabling machines and devices: Principal | ICD-10-CM

## 2016-02-29 DIAGNOSIS — D869 Sarcoidosis, unspecified: Secondary | ICD-10-CM

## 2016-02-29 LAB — ANGIOTENSIN CONVERTING ENZYME: Angiotensin-Converting Enzyme: 41 U/L (ref 8–52)

## 2016-02-29 MED ORDER — PREDNISONE 5 MG PO TABS: ORAL_TABLET | ORAL | 0 refills | Status: DC

## 2016-02-29 NOTE — Assessment & Plan Note (Signed)
CPAP supplies will be renewed  Weight loss encouraged, compliance with goal of at least 4-6 hrs every night is the expectation. Advised against medications with sedative side effects Cautioned against driving when sleepy - understanding that sleepiness will vary on a day to day basis

## 2016-02-29 NOTE — Progress Notes (Signed)
   Subjective:    Patient ID: Elizabeth Gray, female    DOB: [birth year], 75 y.o.   MRN: [id number]  HPI  75/F for FU of sarcoid & OSA -on cpap 12cmh20  Chief Complaint  Patient presents with  . Sleep Apnea    Wears CPAP machine every night; scratchy throat, sinus congestion since Friday, low grade fever off and on, cough, fatigue.  . Sarcoidosis    Annual FU She reports a dry cough for 4 weeks She feels like she has a cold and will never go away, she denies sinus drip. 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 CT 2008  Subtle upper lobe predominant perilymphatic nodularity with mildly prominent mediastinal and bihilar lymphoid tissue as well as gastrohepatic ligament lymph node.  Question sarcoid.  Nodular splenic enhancement pattern  05/2012 RAST neg   Review of Systems neg for any significant sore throat, dysphagia, itching, sneezing, nasal congestion or excess/ purulent secretions, fever, chills, sweats, unintended wt loss, pleuritic or exertional cp, hempoptysis, orthopnea pnd or change in chronic leg swelling. Also denies presyncope, palpitations, heartburn, abdominal pain, nausea, vomiting, diarrhea or change in bowel or urinary habits, dysuria,hematuria, rash, arthralgias, visual complaints, headache, numbness weakness or ataxia.     Objective:   Physical Exam  Gen. Pleasant, well-nourished, in no distress ENT - no lesions, no post nasal drip Neck: No JVD, no thyromegaly, no carotid bruits Lungs: no  use of accessory muscles, no dullness to percussion, clear without rales or rhonchi  Cardiovascular: Rhythm regular, heart sounds  normal, no murmurs or gallops, no peripheral edema Musculoskeletal: No deformities, no cyanosis or clubbing         Assessment & Plan:

## 2016-02-29 NOTE — Assessment & Plan Note (Signed)
Chest x-ray today Check ACE  Prednisone 5 mg tabs  Take 2 tabs daily with food x 7ds, then 1 tab daily with food x 7ds then STOP  Call if cough is no better

## 2016-02-29 NOTE — Patient Instructions (Signed)
Chest x-ray today Blood work for sarcoidosis  Prednisone 5 mg tabs  Take 2 tabs daily with food x 7ds, then 1 tab daily with food x 7ds then STOP  Call if cough is no better  CPAP supplies will be renewed

## 2016-03-04 ENCOUNTER — Ambulatory Visit (INDEPENDENT_AMBULATORY_CARE_PROVIDER_SITE_OTHER): Payer: Medicare Other | Admitting: Internal Medicine

## 2016-03-04 ENCOUNTER — Encounter: Payer: Self-pay | Admitting: Internal Medicine

## 2016-03-04 VITALS — BP 124/80 | HR 64 | Ht 63.5 in | Wt 167.0 lb

## 2016-03-04 DIAGNOSIS — E034 Atrophy of thyroid (acquired): Secondary | ICD-10-CM

## 2016-03-04 DIAGNOSIS — Z0000 Encounter for general adult medical examination without abnormal findings: Secondary | ICD-10-CM | POA: Diagnosis not present

## 2016-03-04 DIAGNOSIS — Z9989 Dependence on other enabling machines and devices: Secondary | ICD-10-CM

## 2016-03-04 DIAGNOSIS — G4733 Obstructive sleep apnea (adult) (pediatric): Secondary | ICD-10-CM | POA: Diagnosis not present

## 2016-03-04 DIAGNOSIS — R079 Chest pain, unspecified: Secondary | ICD-10-CM | POA: Insufficient documentation

## 2016-03-04 DIAGNOSIS — R0789 Other chest pain: Secondary | ICD-10-CM | POA: Diagnosis not present

## 2016-03-04 DIAGNOSIS — E038 Other specified hypothyroidism: Secondary | ICD-10-CM | POA: Diagnosis not present

## 2016-03-04 NOTE — Assessment & Plan Note (Signed)
On Levothroid Labs 

## 2016-03-04 NOTE — Assessment & Plan Note (Addendum)
CXR - ok EKG CL stress test

## 2016-03-04 NOTE — Patient Instructions (Signed)
Preventive Care for Adults, Female A healthy lifestyle and preventive care can promote health and wellness. Preventive health guidelines for women include the following key practices.  A routine yearly physical is a good way to check with your health care provider about your health and preventive screening. It is a chance to share any concerns and updates on your health and to receive a thorough exam.  Visit your dentist for a routine exam and preventive care every 6 months. Brush your teeth twice a day and floss once a day. Good oral hygiene prevents tooth decay and gum disease.  The frequency of eye exams is based on your age, health, family medical history, use of contact lenses, and other factors. Follow your health care provider's recommendations for frequency of eye exams.  Eat a healthy diet. Foods like vegetables, fruits, whole grains, low-fat dairy products, and lean protein foods contain the nutrients you need without too many calories. Decrease your intake of foods high in solid fats, added sugars, and salt. Eat the right amount of calories for you.Get information about a proper diet from your health care provider, if necessary.  Regular physical exercise is one of the most important things you can do for your health. Most adults should get at least 150 minutes of moderate-intensity exercise (any activity that increases your heart rate and causes you to sweat) each week. In addition, most adults need muscle-strengthening exercises on 2 or more days a week.  Maintain a healthy weight. The body mass index (BMI) is a screening tool to identify possible weight problems. It provides an estimate of body fat based on height and weight. Your health care provider can find your BMI and can help you achieve or maintain a healthy weight.For adults 20 years and older:  A BMI below 18.5 is considered underweight.  A BMI of 18.5 to 24.9 is normal.  A BMI of 25 to 29.9 is considered overweight.  A  BMI of 30 and above is considered obese.  Maintain normal blood lipids and cholesterol levels by exercising and minimizing your intake of saturated fat. Eat a balanced diet with plenty of fruit and vegetables. Blood tests for lipids and cholesterol should begin at age 45 and be repeated every 5 years. If your lipid or cholesterol levels are high, you are over 50, or you are at high risk for heart disease, you may need your cholesterol levels checked more frequently.Ongoing high lipid and cholesterol levels should be treated with medicines if diet and exercise are not working.  If you smoke, find out from your health care provider how to quit. If you do not use tobacco, do not start.  Lung cancer screening is recommended for adults aged 45-80 years who are at high risk for developing lung cancer because of a history of smoking. A yearly low-dose CT scan of the lungs is recommended for people who have at least a 30-pack-year history of smoking and are a current smoker or have quit within the past 15 years. A pack year of smoking is smoking an average of 1 pack of cigarettes a day for 1 year (for example: 1 pack a day for 30 years or 2 packs a day for 15 years). Yearly screening should continue until the smoker has stopped smoking for at least 15 years. Yearly screening should be stopped for people who develop a health problem that would prevent them from having lung cancer treatment.  If you are pregnant, do not drink alcohol. If you are  breastfeeding, be very cautious about drinking alcohol. If you are not pregnant and choose to drink alcohol, do not have more than 1 drink per day. One drink is considered to be 12 ounces (355 mL) of beer, 5 ounces (148 mL) of wine, or 1.5 ounces (44 mL) of liquor.  Avoid use of street drugs. Do not share needles with anyone. Ask for help if you need support or instructions about stopping the use of drugs.  High blood pressure causes heart disease and increases the risk  of stroke. Your blood pressure should be checked at least every 1 to 2 years. Ongoing high blood pressure should be treated with medicines if weight loss and exercise do not work.  If you are 55-79 years old, ask your health care provider if you should take aspirin to prevent strokes.  Diabetes screening is done by taking a blood sample to check your blood glucose level after you have not eaten for a certain period of time (fasting). If you are not overweight and you do not have risk factors for diabetes, you should be screened once every 3 years starting at age 45. If you are overweight or obese and you are 40-70 years of age, you should be screened for diabetes every year as part of your cardiovascular risk assessment.  Breast cancer screening is essential preventive care for women. You should practice "breast self-awareness." This means understanding the normal appearance and feel of your breasts and may include breast self-examination. Any changes detected, no matter how small, should be reported to a health care provider. Women in their 20s and 30s should have a clinical breast exam (CBE) by a health care provider as part of a regular health exam every 1 to 3 years. After age 40, women should have a CBE every year. Starting at age 40, women should consider having a mammogram (breast X-ray test) every year. Women who have a family history of breast cancer should talk to their health care provider about genetic screening. Women at a high risk of breast cancer should talk to their health care providers about having an MRI and a mammogram every year.  Breast cancer gene (BRCA)-related cancer risk assessment is recommended for women who have family members with BRCA-related cancers. BRCA-related cancers include breast, ovarian, tubal, and peritoneal cancers. Having family members with these cancers may be associated with an increased risk for harmful changes (mutations) in the breast cancer genes BRCA1 and  BRCA2. Results of the assessment will determine the need for genetic counseling and BRCA1 and BRCA2 testing.  Your health care provider may recommend that you be screened regularly for cancer of the pelvic organs (ovaries, uterus, and vagina). This screening involves a pelvic examination, including checking for microscopic changes to the surface of your cervix (Pap test). You may be encouraged to have this screening done every 3 years, beginning at age 21.  For women ages 30-65, health care providers may recommend pelvic exams and Pap testing every 3 years, or they may recommend the Pap and pelvic exam, combined with testing for human papilloma virus (HPV), every 5 years. Some types of HPV increase your risk of cervical cancer. Testing for HPV may also be done on women of any age with unclear Pap test results.  Other health care providers may not recommend any screening for nonpregnant women who are considered low risk for pelvic cancer and who do not have symptoms. Ask your health care provider if a screening pelvic exam is right for   you.  If you have had past treatment for cervical cancer or a condition that could lead to cancer, you need Pap tests and screening for cancer for at least 20 years after your treatment. If Pap tests have been discontinued, your risk factors (such as having a new sexual partner) need to be reassessed to determine if screening should resume. Some women have medical problems that increase the chance of getting cervical cancer. In these cases, your health care provider may recommend more frequent screening and Pap tests.  Colorectal cancer can be detected and often prevented. Most routine colorectal cancer screening begins at the age of 50 years and continues through age 75 years. However, your health care provider may recommend screening at an earlier age if you have risk factors for colon cancer. On a yearly basis, your health care provider may provide home test kits to check  for hidden blood in the stool. Use of a small camera at the end of a tube, to directly examine the colon (sigmoidoscopy or colonoscopy), can detect the earliest forms of colorectal cancer. Talk to your health care provider about this at age 50, when routine screening begins. Direct exam of the colon should be repeated every 5-10 years through age 75 years, unless early forms of precancerous polyps or small growths are found.  People who are at an increased risk for hepatitis B should be screened for this virus. You are considered at high risk for hepatitis B if:  You were born in a country where hepatitis B occurs often. Talk with your health care provider about which countries are considered high risk.  Your parents were born in a high-risk country and you have not received a shot to protect against hepatitis B (hepatitis B vaccine).  You have HIV or AIDS.  You use needles to inject street drugs.  You live with, or have sex with, someone who has hepatitis B.  You get hemodialysis treatment.  You take certain medicines for conditions like cancer, organ transplantation, and autoimmune conditions.  Hepatitis C blood testing is recommended for all people born from 1945 through 1965 and any individual with known risks for hepatitis C.  Practice safe sex. Use condoms and avoid high-risk sexual practices to reduce the spread of sexually transmitted infections (STIs). STIs include gonorrhea, chlamydia, syphilis, trichomonas, herpes, HPV, and human immunodeficiency virus (HIV). Herpes, HIV, and HPV are viral illnesses that have no cure. They can result in disability, cancer, and death.  You should be screened for sexually transmitted illnesses (STIs) including gonorrhea and chlamydia if:  You are sexually active and are younger than 24 years.  You are older than 24 years and your health care provider tells you that you are at risk for this type of infection.  Your sexual activity has changed  since you were last screened and you are at an increased risk for chlamydia or gonorrhea. Ask your health care provider if you are at risk.  If you are at risk of being infected with HIV, it is recommended that you take a prescription medicine daily to prevent HIV infection. This is called preexposure prophylaxis (PrEP). You are considered at risk if:  You are sexually active and do not regularly use condoms or know the HIV status of your partner(s).  You take drugs by injection.  You are sexually active with a partner who has HIV.  Talk with your health care provider about whether you are at high risk of being infected with HIV. If   you choose to begin PrEP, you should first be tested for HIV. You should then be tested every 3 months for as long as you are taking PrEP.  Osteoporosis is a disease in which the bones lose minerals and strength with aging. This can result in serious bone fractures or breaks. The risk of osteoporosis can be identified using a bone density scan. Women ages 67 years and over and women at risk for fractures or osteoporosis should discuss screening with their health care providers. Ask your health care provider whether you should take a calcium supplement or vitamin D to reduce the rate of osteoporosis.  Menopause can be associated with physical symptoms and risks. Hormone replacement therapy is available to decrease symptoms and risks. You should talk to your health care provider about whether hormone replacement therapy is right for you.  Use sunscreen. Apply sunscreen liberally and repeatedly throughout the day. You should seek shade when your shadow is shorter than you. Protect yourself by wearing long sleeves, pants, a wide-brimmed hat, and sunglasses year round, whenever you are outdoors.  Once a month, do a whole body skin exam, using a mirror to look at the skin on your back. Tell your health care provider of new moles, moles that have irregular borders, moles that  are larger than a pencil eraser, or moles that have changed in shape or color.  Stay current with required vaccines (immunizations).  Influenza vaccine. All adults should be immunized every year.  Tetanus, diphtheria, and acellular pertussis (Td, Tdap) vaccine. Pregnant women should receive 1 dose of Tdap vaccine during each pregnancy. The dose should be obtained regardless of the length of time since the last dose. Immunization is preferred during the 27th-36th week of gestation. An adult who has not previously received Tdap or who does not know her vaccine status should receive 1 dose of Tdap. This initial dose should be followed by tetanus and diphtheria toxoids (Td) booster doses every 10 years. Adults with an unknown or incomplete history of completing a 3-dose immunization series with Td-containing vaccines should begin or complete a primary immunization series including a Tdap dose. Adults should receive a Td booster every 10 years.  Varicella vaccine. An adult without evidence of immunity to varicella should receive 2 doses or a second dose if she has previously received 1 dose. Pregnant females who do not have evidence of immunity should receive the first dose after pregnancy. This first dose should be obtained before leaving the health care facility. The second dose should be obtained 4-8 weeks after the first dose.  Human papillomavirus (HPV) vaccine. Females aged 13-26 years who have not received the vaccine previously should obtain the 3-dose series. The vaccine is not recommended for use in pregnant females. However, pregnancy testing is not needed before receiving a dose. If a female is found to be pregnant after receiving a dose, no treatment is needed. In that case, the remaining doses should be delayed until after the pregnancy. Immunization is recommended for any person with an immunocompromised condition through the age of 61 years if she did not get any or all doses earlier. During the  3-dose series, the second dose should be obtained 4-8 weeks after the first dose. The third dose should be obtained 24 weeks after the first dose and 16 weeks after the second dose.  Zoster vaccine. One dose is recommended for adults aged 30 years or older unless certain conditions are present.  Measles, mumps, and rubella (MMR) vaccine. Adults born  before 1957 generally are considered immune to measles and mumps. Adults born in 1957 or later should have 1 or more doses of MMR vaccine unless there is a contraindication to the vaccine or there is laboratory evidence of immunity to each of the three diseases. A routine second dose of MMR vaccine should be obtained at least 28 days after the first dose for students attending postsecondary schools, health care workers, or international travelers. People who received inactivated measles vaccine or an unknown type of measles vaccine during 1963-1967 should receive 2 doses of MMR vaccine. People who received inactivated mumps vaccine or an unknown type of mumps vaccine before 1979 and are at high risk for mumps infection should consider immunization with 2 doses of MMR vaccine. For females of childbearing age, rubella immunity should be determined. If there is no evidence of immunity, females who are not pregnant should be vaccinated. If there is no evidence of immunity, females who are pregnant should delay immunization until after pregnancy. Unvaccinated health care workers born before 1957 who lack laboratory evidence of measles, mumps, or rubella immunity or laboratory confirmation of disease should consider measles and mumps immunization with 2 doses of MMR vaccine or rubella immunization with 1 dose of MMR vaccine.  Pneumococcal 13-valent conjugate (PCV13) vaccine. When indicated, a person who is uncertain of his immunization history and has no record of immunization should receive the PCV13 vaccine. All adults 65 years of age and older should receive this  vaccine. An adult aged 19 years or older who has certain medical conditions and has not been previously immunized should receive 1 dose of PCV13 vaccine. This PCV13 should be followed with a dose of pneumococcal polysaccharide (PPSV23) vaccine. Adults who are at high risk for pneumococcal disease should obtain the PPSV23 vaccine at least 8 weeks after the dose of PCV13 vaccine. Adults older than 77 years of age who have normal immune system function should obtain the PPSV23 vaccine dose at least 1 year after the dose of PCV13 vaccine.  Pneumococcal polysaccharide (PPSV23) vaccine. When PCV13 is also indicated, PCV13 should be obtained first. All adults aged 65 years and older should be immunized. An adult younger than age 65 years who has certain medical conditions should be immunized. Any person who resides in a nursing home or long-term care facility should be immunized. An adult smoker should be immunized. People with an immunocompromised condition and certain other conditions should receive both PCV13 and PPSV23 vaccines. People with human immunodeficiency virus (HIV) infection should be immunized as soon as possible after diagnosis. Immunization during chemotherapy or radiation therapy should be avoided. Routine use of PPSV23 vaccine is not recommended for American Indians, [place] Natives, or people younger than 65 years unless there are medical conditions that require PPSV23 vaccine. When indicated, people who have unknown immunization and have no record of immunization should receive PPSV23 vaccine. One-time revaccination 5 years after the first dose of PPSV23 is recommended for people aged 19-64 years who have chronic kidney failure, nephrotic syndrome, asplenia, or immunocompromised conditions. People who received 1-2 doses of PPSV23 before age 65 years should receive another dose of PPSV23 vaccine at age 65 years or later if at least 5 years have passed since the previous dose. Doses of PPSV23 are not  needed for people immunized with PPSV23 at or after age 65 years.  Meningococcal vaccine. Adults with asplenia or persistent complement component deficiencies should receive 2 doses of quadrivalent meningococcal conjugate (MenACWY-D) vaccine. The doses should be obtained   at least 2 months apart. Microbiologists working with certain meningococcal bacteria, [place] recruits, people at risk during an outbreak, and people who travel to or live in countries with a high rate of meningitis should be immunized. A first-year college student up through age 34 years who is living in a residence hall should receive a dose if she did not receive a dose on or after her 16th birthday. Adults who have certain high-risk conditions should receive one or more doses of vaccine.  Hepatitis A vaccine. Adults who wish to be protected from this disease, have certain high-risk conditions, work with hepatitis A-infected animals, work in hepatitis A research labs, or travel to or work in countries with a high rate of hepatitis A should be immunized. Adults who were previously unvaccinated and who anticipate close contact with an international adoptee during the first 60 days after arrival in the [place] from a country with a high rate of hepatitis A should be immunized.  Hepatitis B vaccine. Adults who wish to be protected from this disease, have certain high-risk conditions, may be exposed to blood or other infectious body fluids, are household contacts or sex partners of hepatitis B positive people, are clients or workers in certain care facilities, or travel to or work in countries with a high rate of hepatitis B should be immunized.  Haemophilus influenzae type b (Hib) vaccine. A previously unvaccinated person with asplenia or sickle cell disease or having a scheduled splenectomy should receive 1 dose of Hib vaccine. Regardless of previous immunization, a recipient of a hematopoietic stem cell transplant should receive a  3-dose series 6-12 months after her successful transplant. Hib vaccine is not recommended for adults with HIV infection. Preventive Services / Frequency Ages 35 to 4 years  Blood pressure check.** / Every 3-5 years.  Lipid and cholesterol check.** / Every 5 years beginning at age 60.  Clinical breast exam.** / Every 3 years for women in their 71s and 10s.  BRCA-related cancer risk assessment.** / For women who have family members with a BRCA-related cancer (breast, ovarian, tubal, or peritoneal cancers).  Pap test.** / Every 2 years from ages 76 through 26. Every 3 years starting at age 61 through age 76 or 93 with a history of 3 consecutive normal Pap tests.  HPV screening.** / Every 3 years from ages 37 through ages 60 to 51 with a history of 3 consecutive normal Pap tests.  Hepatitis C blood test.** / For any individual with known risks for hepatitis C.  Skin self-exam. / Monthly.  Influenza vaccine. / Every year.  Tetanus, diphtheria, and acellular pertussis (Tdap, Td) vaccine.** / Consult your health care provider. Pregnant women should receive 1 dose of Tdap vaccine during each pregnancy. 1 dose of Td every 10 years.  Varicella vaccine.** / Consult your health care provider. Pregnant females who do not have evidence of immunity should receive the first dose after pregnancy.  HPV vaccine. / 3 doses over 6 months, if 93 and younger. The vaccine is not recommended for use in pregnant females. However, pregnancy testing is not needed before receiving a dose.  Measles, mumps, rubella (MMR) vaccine.** / You need at least 1 dose of MMR if you were born in 1957 or later. You may also need a 2nd dose. For females of childbearing age, rubella immunity should be determined. If there is no evidence of immunity, females who are not pregnant should be vaccinated. If there is no evidence of immunity, females who are  pregnant should delay immunization until after pregnancy.  Pneumococcal  13-valent conjugate (PCV13) vaccine.** / Consult your health care provider.  Pneumococcal polysaccharide (PPSV23) vaccine.** / 1 to 2 doses if you smoke cigarettes or if you have certain conditions.  Meningococcal vaccine.** / 1 dose if you are age 68 to 8 years and a Market researcher living in a residence hall, or have one of several medical conditions, you need to get vaccinated against meningococcal disease. You may also need additional booster doses.  Hepatitis A vaccine.** / Consult your health care provider.  Hepatitis B vaccine.** / Consult your health care provider.  Haemophilus influenzae type b (Hib) vaccine.** / Consult your health care provider. Ages 7 to 53 years  Blood pressure check.** / Every year.  Lipid and cholesterol check.** / Every 5 years beginning at age 25 years.  Lung cancer screening. / Every year if you are aged 11-80 years and have a 30-pack-year history of smoking and currently smoke or have quit within the past 15 years. Yearly screening is stopped once you have quit smoking for at least 15 years or develop a health problem that would prevent you from having lung cancer treatment.  Clinical breast exam.** / Every year after age 48 years.  BRCA-related cancer risk assessment.** / For women who have family members with a BRCA-related cancer (breast, ovarian, tubal, or peritoneal cancers).  Mammogram.** / Every year beginning at age 41 years and continuing for as long as you are in good health. Consult with your health care provider.  Pap test.** / Every 3 years starting at age 65 years through age 37 or 70 years with a history of 3 consecutive normal Pap tests.  HPV screening.** / Every 3 years from ages 72 years through ages 60 to 40 years with a history of 3 consecutive normal Pap tests.  Fecal occult blood test (FOBT) of stool. / Every year beginning at age 21 years and continuing until age 5 years. You may not need to do this test if you get  a colonoscopy every 10 years.  Flexible sigmoidoscopy or colonoscopy.** / Every 5 years for a flexible sigmoidoscopy or every 10 years for a colonoscopy beginning at age 35 years and continuing until age 48 years.  Hepatitis C blood test.** / For all people born from [birth year] through 1965 and any individual with known risks for hepatitis C.  Skin self-exam. / Monthly.  Influenza vaccine. / Every year.  Tetanus, diphtheria, and acellular pertussis (Tdap/Td) vaccine.** / Consult your health care provider. Pregnant women should receive 1 dose of Tdap vaccine during each pregnancy. 1 dose of Td every 10 years.  Varicella vaccine.** / Consult your health care provider. Pregnant females who do not have evidence of immunity should receive the first dose after pregnancy.  Zoster vaccine.** / 1 dose for adults aged 30 years or older.  Measles, mumps, rubella (MMR) vaccine.** / You need at least 1 dose of MMR if you were born in 1957 or later. You may also need a second dose. For females of childbearing age, rubella immunity should be determined. If there is no evidence of immunity, females who are not pregnant should be vaccinated. If there is no evidence of immunity, females who are pregnant should delay immunization until after pregnancy.  Pneumococcal 13-valent conjugate (PCV13) vaccine.** / Consult your health care provider.  Pneumococcal polysaccharide (PPSV23) vaccine.** / 1 to 2 doses if you smoke cigarettes or if you have certain conditions.  Meningococcal vaccine.** /  Consult your health care provider.  Hepatitis A vaccine.** / Consult your health care provider.  Hepatitis B vaccine.** / Consult your health care provider.  Haemophilus influenzae type b (Hib) vaccine.** / Consult your health care provider. Ages 64 years and over  Blood pressure check.** / Every year.  Lipid and cholesterol check.** / Every 5 years beginning at age 23 years.  Lung cancer screening. / Every year if you  are aged 16-80 years and have a 30-pack-year history of smoking and currently smoke or have quit within the past 15 years. Yearly screening is stopped once you have quit smoking for at least 15 years or develop a health problem that would prevent you from having lung cancer treatment.  Clinical breast exam.** / Every year after age 74 years.  BRCA-related cancer risk assessment.** / For women who have family members with a BRCA-related cancer (breast, ovarian, tubal, or peritoneal cancers).  Mammogram.** / Every year beginning at age 44 years and continuing for as long as you are in good health. Consult with your health care provider.  Pap test.** / Every 3 years starting at age 58 years through age 22 or 39 years with 3 consecutive normal Pap tests. Testing can be stopped between 65 and 70 years with 3 consecutive normal Pap tests and no abnormal Pap or HPV tests in the past 10 years.  HPV screening.** / Every 3 years from ages 64 years through ages 70 or 61 years with a history of 3 consecutive normal Pap tests. Testing can be stopped between 65 and 70 years with 3 consecutive normal Pap tests and no abnormal Pap or HPV tests in the past 10 years.  Fecal occult blood test (FOBT) of stool. / Every year beginning at age 40 years and continuing until age 27 years. You may not need to do this test if you get a colonoscopy every 10 years.  Flexible sigmoidoscopy or colonoscopy.** / Every 5 years for a flexible sigmoidoscopy or every 10 years for a colonoscopy beginning at age 7 years and continuing until age 32 years.  Hepatitis C blood test.** / For all people born from [birth year] through 1967 and any individual with known risks for hepatitis C.  Osteoporosis screening.** / A one-time screening for women ages 30 years and over and women at risk for fractures or osteoporosis.  Skin self-exam. / Monthly.  Influenza vaccine. / Every year.  Tetanus, diphtheria, and acellular pertussis (Tdap/Td)  vaccine.** / 1 dose of Td every 10 years.  Varicella vaccine.** / Consult your health care provider.  Zoster vaccine.** / 1 dose for adults aged 35 years or older.  Pneumococcal 13-valent conjugate (PCV13) vaccine.** / Consult your health care provider.  Pneumococcal polysaccharide (PPSV23) vaccine.** / 1 dose for all adults aged 46 years and older.  Meningococcal vaccine.** / Consult your health care provider.  Hepatitis A vaccine.** / Consult your health care provider.  Hepatitis B vaccine.** / Consult your health care provider.  Haemophilus influenzae type b (Hib) vaccine.** / Consult your health care provider. ** Family history and personal history of risk and conditions may change your health care provider's recommendations.   This information is not intended to replace advice given to you by your health care provider. Make sure you discuss any questions you have with your health care provider.   Document Released: 09/13/2001 Document Revised: 08/08/2014 Document Reviewed: 12/13/2010 Elsevier Interactive Patient Education [employer].

## 2016-03-04 NOTE — Assessment & Plan Note (Signed)
On CPAP. ?

## 2016-03-04 NOTE — Progress Notes (Signed)
Pre visit review using our clinic review tool, if applicable. No additional management support is needed unless otherwise documented below in the visit note. 

## 2016-03-04 NOTE — Assessment & Plan Note (Signed)

## 2016-03-04 NOTE — Progress Notes (Signed)
Subjective:  Patient ID: [name], female    DOB: [birth year]  Age: 75 y.o. MRN: [id number]  CC: No chief complaint on file.   HPI LATESSA PEROVICH presents for well exam C/o wt gain of 10 lbs C/o CP x 5 min the other day while driving. On Prednisone now  Outpatient Medications Prior to Visit  Medication Sig Dispense Refill  . Alum Hydroxide-Mag Carbonate (GAVISCON PO) Take 1 tablet by mouth as needed (reflux). As directed as needed    . aspirin 81 MG tablet Take 81 mg by mouth daily.    . beclomethasone (QVAR) 40 MCG/ACT inhaler Inhale 2 puffs into the lungs 2 (two) times daily. 1 Inhaler 11  . Cholecalciferol (VITAMIN D PO) Take 1,000 Units by mouth daily.     Marland Kitchen CREON 24000 UNITS CPEP TAKE 1 CAPSULE (24,000 UNITS TOTAL) BY MOUTH 3 (THREE) TIMES DAILY. 270 capsule 2  . cyanocobalamin (,VITAMIN B-12,) 1000 MCG/ML injection Inject 1 mL (1,000 mcg total) into the muscle every 14 (fourteen) days. 10 mL 2  . Flaxseed, Linseed, (FLAXSEED OIL) OIL Take 1 capsule by mouth 2 (two) times daily.      Marland Kitchen GLUCOSAMINE-CHONDROITIN-MSM-D3 PO Take 1 tablet by mouth 2 (two) times daily.    Marland Kitchen levothyroxine (SYNTHROID, LEVOTHROID) 100 MCG tablet TAKE 1 TABLET EVERY DAY 90 tablet 3  . magnesium oxide (MAG-OX) 400 MG tablet Take 400 mg by mouth at bedtime.    . Multiple Vitamins-Minerals (ICAPS AREDS FORMULA PO) Take 1 capsule by mouth daily.      Marland Kitchen omeprazole-sodium bicarbonate (ZEGERID) 40-1100 MG capsule TAKE 1 CAPSULE BY MOUTH DAILY BEFORE BREAKFAST. 90 capsule 0  . OVER THE COUNTER MEDICATION Take 5 tablets by mouth 2 (two) times daily. Supplement - [place]    . Polyethyl Glycol-Propyl Glycol (SYSTANE ULTRA) 0.4-0.3 % SOLN Apply 1 drop to eye as needed (dry eyes).     . predniSONE (DELTASONE) 5 MG tablet Take 2 tabs daily w/ food x 7 days, then 1 tab x 7 days, then stop 21 tablet 0  . topiramate (TOPAMAX) 100 MG tablet Take 1 tablet (100 mg total) by mouth at bedtime. 90 tablet 4    . traMADol (ULTRAM) 50 MG tablet Take 100 mg by mouth at bedtime.     . vitamin C (ASCORBIC ACID) 500 MG tablet Take 500 mg by mouth daily.      Marland Kitchen zolpidem (AMBIEN) 5 MG tablet Take 1 tablet (5 mg total) by mouth at bedtime as needed. for sleep 30 tablet 5   No facility-administered medications prior to visit.     ROS Review of Systems  Constitutional: Positive for unexpected weight change. Negative for activity change, appetite change, chills and fatigue.  HENT: Negative for congestion, mouth sores and sinus pressure.   Eyes: Negative for visual disturbance.  Respiratory: Negative for cough and chest tightness.   Gastrointestinal: Negative for abdominal pain and nausea.  Genitourinary: Negative for difficulty urinating, frequency and vaginal pain.  Musculoskeletal: Negative for back pain and gait problem.  Skin: Negative for pallor and rash.  Neurological: Negative for dizziness, tremors, weakness, numbness and headaches.  Psychiatric/Behavioral: Negative for confusion and sleep disturbance.    Objective:  BP 124/80   Pulse 64   Ht 5' 3.5" (1.613 m)   Wt 167 lb (75.8 kg)   SpO2 97%   BMI 29.12 kg/m   BP Readings from Last 3 Encounters:  03/04/16 124/80  02/29/16 120/78  08/07/15 122/80    Wt Readings from Last 3 Encounters:  03/04/16 167 lb (75.8 kg)  02/29/16 167 lb 3.2 oz (75.8 kg)  08/07/15 161 lb (73 kg)    Physical Exam  Constitutional: She appears well-developed. No distress.  HENT:  Head: Normocephalic.  Right Ear: External ear normal.  Left Ear: External ear normal.  Nose: Nose normal.  Mouth/Throat: Oropharynx is clear and moist.  Eyes: Conjunctivae are normal. Pupils are equal, round, and reactive to light. Right eye exhibits no discharge. Left eye exhibits no discharge.  Neck: Normal range of motion. Neck supple. No JVD present. No tracheal deviation present. No thyromegaly present.  Cardiovascular: Normal rate, regular rhythm and normal heart sounds.    Pulmonary/Chest: No stridor. No respiratory distress. She has no wheezes.  Abdominal: Soft. Bowel sounds are normal. She exhibits no distension and no mass. There is no tenderness. There is no rebound and no guarding.  Musculoskeletal: She exhibits no edema or tenderness.  Lymphadenopathy:    She has no cervical adenopathy.  Neurological: She displays normal reflexes. No cranial nerve deficit. She exhibits normal muscle tone. Coordination normal.  Skin: No rash noted. No erythema.  Psychiatric: She has a normal mood and affect. Her behavior is normal. Judgment and thought content normal.   Procedure: EKG Indication: chest pain Impression: NSR. No acute changes.  Lab Results  Component Value Date   WBC 7.6 01/30/2015   HGB 13.1 01/30/2015   HCT 39.0 01/30/2015   PLT 329.0 01/30/2015   GLUCOSE 99 08/07/2015   CHOL 136 01/30/2015   TRIG 83.0 01/30/2015   HDL 63.80 01/30/2015   LDLCALC 56 01/30/2015   ALT 16 01/30/2015   AST 19 01/30/2015   NA 143 08/07/2015   K 4.1 08/07/2015   CL 106 08/07/2015   CREATININE 0.76 08/07/2015   BUN 20 08/07/2015   CO2 27 08/07/2015   TSH 0.33 (L) 08/07/2015   INR 3.03 (H) 05/12/2014    Dg Chest 2 View  Result Date: 02/29/2016 CLINICAL DATA:  Dry cough for 3 weeks EXAM: CHEST  2 VIEW COMPARISON:  April 28, 2014 FINDINGS: The heart size and mediastinal contours are within normal limits. Both lungs are clear. The visualized skeletal structures are unremarkable. IMPRESSION: No active cardiopulmonary disease. Electronically Signed   By: Dorise Bullion III M.D   On: 02/29/2016 10:00   Assessment & Plan:   There are no diagnoses linked to this encounter. I am having Ms. Bielak maintain her Flaxseed Oil, Alum Hydroxide-Mag Carbonate (GAVISCON PO), Multiple Vitamins-Minerals (ICAPS AREDS FORMULA PO), vitamin C, traMADol, Cholecalciferol (VITAMIN D PO), aspirin, Polyethyl Glycol-Propyl Glycol, magnesium oxide, OVER THE COUNTER MEDICATION,  GLUCOSAMINE-CHONDROITIN-MSM-D3 PO, beclomethasone, levothyroxine, topiramate, CREON, zolpidem, cyanocobalamin, omeprazole-sodium bicarbonate, and predniSONE.  No orders of the defined types were placed in this encounter.    Follow-up: No Follow-up on file.  Walker Kehr, MD

## 2016-03-10 ENCOUNTER — Encounter: Payer: Self-pay | Admitting: Pulmonary Disease

## 2016-03-11 ENCOUNTER — Other Ambulatory Visit: Payer: Self-pay | Admitting: Internal Medicine

## 2016-03-22 ENCOUNTER — Other Ambulatory Visit: Payer: Self-pay | Admitting: Internal Medicine

## 2016-03-30 ENCOUNTER — Encounter: Payer: Self-pay | Admitting: Diagnostic Neuroimaging

## 2016-03-30 ENCOUNTER — Encounter (HOSPITAL_COMMUNITY): Payer: Medicare Other

## 2016-04-01 ENCOUNTER — Other Ambulatory Visit (INDEPENDENT_AMBULATORY_CARE_PROVIDER_SITE_OTHER): Payer: Medicare Other

## 2016-04-01 DIAGNOSIS — Z0000 Encounter for general adult medical examination without abnormal findings: Secondary | ICD-10-CM

## 2016-04-01 DIAGNOSIS — G4733 Obstructive sleep apnea (adult) (pediatric): Secondary | ICD-10-CM

## 2016-04-01 DIAGNOSIS — E034 Atrophy of thyroid (acquired): Secondary | ICD-10-CM

## 2016-04-01 DIAGNOSIS — Z9989 Dependence on other enabling machines and devices: Secondary | ICD-10-CM

## 2016-04-01 DIAGNOSIS — E038 Other specified hypothyroidism: Secondary | ICD-10-CM

## 2016-04-01 DIAGNOSIS — R0789 Other chest pain: Secondary | ICD-10-CM

## 2016-04-01 LAB — CBC WITH DIFFERENTIAL/PLATELET
BASOS ABS: 0 10*3/uL (ref 0.0–0.1)
Basophils Relative: 0.6 % (ref 0.0–3.0)
EOS ABS: 0.3 10*3/uL (ref 0.0–0.7)
EOS PCT: 4.3 % (ref 0.0–5.0)
HCT: 37 % (ref 36.0–46.0)
HEMOGLOBIN: 12.6 g/dL (ref 12.0–15.0)
LYMPHS ABS: 2.2 10*3/uL (ref 0.7–4.0)
Lymphocytes Relative: 37 % (ref 12.0–46.0)
MCHC: 34.1 g/dL (ref 30.0–36.0)
MCV: 85.4 fl (ref 78.0–100.0)
MONO ABS: 0.7 10*3/uL (ref 0.1–1.0)
Monocytes Relative: 11.8 % (ref 3.0–12.0)
NEUTROS PCT: 46.3 % (ref 43.0–77.0)
Neutro Abs: 2.8 10*3/uL (ref 1.4–7.7)
Platelets: 347 10*3/uL (ref 150.0–400.0)
RBC: 4.33 Mil/uL (ref 3.87–5.11)
RDW: 14.2 % (ref 11.5–15.5)
WBC: 6 10*3/uL (ref 4.0–10.5)

## 2016-04-01 LAB — HEPATIC FUNCTION PANEL
ALBUMIN: 4.1 g/dL (ref 3.5–5.2)
ALT: 20 U/L (ref 0–35)
AST: 21 U/L (ref 0–37)
Alkaline Phosphatase: 55 U/L (ref 39–117)
Bilirubin, Direct: 0.1 mg/dL (ref 0.0–0.3)
Total Bilirubin: 0.4 mg/dL (ref 0.2–1.2)
Total Protein: 7.1 g/dL (ref 6.0–8.3)

## 2016-04-01 LAB — LIPID PANEL
CHOLESTEROL: 149 mg/dL (ref 0–200)
HDL: 60.4 mg/dL (ref >39.00)
LDL CALC: 63 mg/dL (ref 0–99)
NonHDL: 88.1
TRIGLYCERIDES: 126 mg/dL (ref 0.0–149.0)
Total CHOL/HDL Ratio: 2
VLDL: 25.2 mg/dL (ref 0.0–40.0)

## 2016-04-01 LAB — BASIC METABOLIC PANEL
BUN: 17 mg/dL (ref 6–23)
CALCIUM: 9.2 mg/dL (ref 8.4–10.5)
CO2: 28 mEq/L (ref 19–32)
Chloride: 108 mEq/L (ref 96–112)
Creatinine, Ser: 0.77 mg/dL (ref 0.40–1.20)
GFR: 77.18 mL/min (ref >60.00)
GLUCOSE: 109 mg/dL — AB (ref 70–99)
POTASSIUM: 4.1 meq/L (ref 3.5–5.1)
Sodium: 142 mEq/L (ref 135–145)

## 2016-04-01 LAB — URINALYSIS
BILIRUBIN URINE: NEGATIVE
Hgb urine dipstick: NEGATIVE
Ketones, ur: NEGATIVE
LEUKOCYTES UA: NEGATIVE
NITRITE: NEGATIVE
PH: 8 (ref 5.0–8.0)
Specific Gravity, Urine: 1.015 (ref 1.000–1.030)
TOTAL PROTEIN, URINE-UPE24: NEGATIVE
Urine Glucose: NEGATIVE
Urobilinogen, UA: 0.2 (ref 0.0–1.0)

## 2016-04-01 LAB — TSH: TSH: 0.28 u[IU]/mL — AB (ref 0.35–4.50)

## 2016-04-06 ENCOUNTER — Telehealth (HOSPITAL_COMMUNITY): Payer: Self-pay | Admitting: *Deleted

## 2016-04-06 NOTE — Telephone Encounter (Signed)
Left message on voicemail in reference to upcoming appointment scheduled for 04/11/16. Phone number given for a call back so details instructions can be given. Elizabeth Gray   

## 2016-04-07 ENCOUNTER — Telehealth (HOSPITAL_COMMUNITY): Payer: Self-pay

## 2016-04-07 NOTE — Telephone Encounter (Signed)
Patient given detailed instructions per Myocardial Perfusion Study Information Sheet for the test on 04/11/16 at 0945. Patient notified to arrive 15 minutes early and that it is imperative to arrive on time for appointment to keep from having the test rescheduled.  If you need to cancel or reschedule your appointment, please call the office within 24 hours of your appointment. Failure to do so may result in a cancellation of your appointment, and a $50 no show fee. Patient verbalized understanding.T. Shonnie Poudrier, CNMT, RT-N

## 2016-04-11 ENCOUNTER — Ambulatory Visit (HOSPITAL_COMMUNITY): Payer: Medicare Other | Attending: Internal Medicine

## 2016-04-11 ENCOUNTER — Other Ambulatory Visit: Payer: Self-pay | Admitting: Internal Medicine

## 2016-04-11 DIAGNOSIS — E034 Atrophy of thyroid (acquired): Secondary | ICD-10-CM

## 2016-04-11 DIAGNOSIS — Z87891 Personal history of nicotine dependence: Secondary | ICD-10-CM | POA: Diagnosis not present

## 2016-04-11 DIAGNOSIS — R0789 Other chest pain: Secondary | ICD-10-CM | POA: Diagnosis not present

## 2016-04-11 DIAGNOSIS — Z0000 Encounter for general adult medical examination without abnormal findings: Secondary | ICD-10-CM

## 2016-04-11 DIAGNOSIS — E559 Vitamin D deficiency, unspecified: Secondary | ICD-10-CM

## 2016-04-11 LAB — MYOCARDIAL PERFUSION IMAGING
CSEPEDS: 0 s
CSEPHR: 106 %
Estimated workload: 7 METS
Exercise duration (min): 5 min
LVDIAVOL: 68 mL (ref 46–106)
LVSYSVOL: 22 mL
MPHR: 143 {beats}/min
NUC STRESS TID: 0.94
Peak HR: 153 {beats}/min
RATE: 0.26
Rest HR: 65 {beats}/min
SDS: 2
SRS: 1
SSS: 3

## 2016-04-11 MED ORDER — TECHNETIUM TC 99M TETROFOSMIN IV KIT
31.6000 | PACK | Freq: Once | INTRAVENOUS | Status: AC | PRN
  Administered 2016-04-11: 32 via INTRAVENOUS
  Filled 2016-04-11: qty 32

## 2016-04-11 MED ORDER — TECHNETIUM TC 99M TETROFOSMIN IV KIT
10.1000 | PACK | Freq: Once | INTRAVENOUS | Status: AC | PRN
  Administered 2016-04-11: 10.1 via INTRAVENOUS
  Filled 2016-04-11: qty 10

## 2016-04-13 NOTE — Telephone Encounter (Signed)
Called refill into [facility] had to leave on pharmacy vm../lmb 

## 2016-04-27 DIAGNOSIS — Z23 Encounter for immunization: Secondary | ICD-10-CM | POA: Diagnosis not present

## 2016-05-02 ENCOUNTER — Other Ambulatory Visit: Payer: Self-pay | Admitting: *Deleted

## 2016-05-02 DIAGNOSIS — D869 Sarcoidosis, unspecified: Secondary | ICD-10-CM

## 2016-05-02 MED ORDER — BECLOMETHASONE DIPROPIONATE 40 MCG/ACT IN AERS: 2.0000 | INHALATION_SPRAY | Freq: Two times a day (BID) | RESPIRATORY_TRACT | 11 refills | Status: DC

## 2016-05-09 ENCOUNTER — Ambulatory Visit: Payer: Medicare Other | Admitting: Diagnostic Neuroimaging

## 2016-05-13 ENCOUNTER — Ambulatory Visit (INDEPENDENT_AMBULATORY_CARE_PROVIDER_SITE_OTHER): Payer: Medicare Other | Admitting: Diagnostic Neuroimaging

## 2016-05-13 ENCOUNTER — Encounter: Payer: Self-pay | Admitting: Diagnostic Neuroimaging

## 2016-05-13 VITALS — BP 123/71 | HR 71 | Ht 63.5 in | Wt 170.6 lb

## 2016-05-13 DIAGNOSIS — H8111 Benign paroxysmal vertigo, right ear: Secondary | ICD-10-CM | POA: Diagnosis not present

## 2016-05-13 DIAGNOSIS — G44309 Post-traumatic headache, unspecified, not intractable: Secondary | ICD-10-CM

## 2016-05-13 DIAGNOSIS — I83892 Varicose veins of left lower extremities with other complications: Secondary | ICD-10-CM

## 2016-05-13 MED ORDER — TOPIRAMATE 100 MG PO TABS: 100.0000 mg | ORAL_TABLET | Freq: Every day | ORAL | 4 refills | Status: DC

## 2016-05-13 NOTE — Progress Notes (Signed)
PATIENT: Elizabeth Gray DOB: [birth year]  REASON FOR VISIT: follow up HISTORY FROM: patient  Chief Complaint  Patient presents with  . Follow-up    RM 6, alone. Last seen 05/11/15 for headaches. Slight frontal headache with pressure. Patient does not feel like this is typical migraine, more weather/sinus related.     HISTORY OF PRESENT ILLNESS:  UPDATE 05/13/16: Since last visit, HA are stable. Mild dull HA today (? Weather related). Sleep apnea stable on CPAP. Tolerating TPX. Some new left leg swelling in afternoons.   UPDATE 05/11/15: Since last visit, overall stable. Sometimes with standing or moving too quickly, triggers vertigo attacks. For now, symptoms are mild.   UPDATE 11/10/14: Since last visit, doing much better. HA are much improved. On TPX '100mg'$  qhs. Prednisone course seemed to have helped.  UPDATE 09/19/14: Since last visit patient was doing well. She had knee replacement surgery in October 2015. Following this patient had forgot to restart topiramate. She continues to do well without headaches. Unfortunately 08/28/2014, patient had a fall when she was helping unload wood from pickup truck, fell backwards and struck her head. She had pain in the back of her head. She was able to finish her work. 2 days later she had recurrence of her prior migraine headaches. She describes left frontal throbbing severe headaches with sensitivity to light and sound. She has been taking Tylenol arthritis strength, 2 tabs twice a day for several weeks. She is also taking topiramate 100 mg twice a day again. This week headaches were slightly better. She rates headaches as 5 out of 10. Also a few days ago patient noticed intermittent tingling in her right hand fingertips, digits 2, 3, 5. Symptoms last 10-20 minutes at a time. She also noticed some discoloration in her right third digit distally, lasting a few minutes. Patient also having some intermittent left-sided neck pain, radiating to left  arm.  UPDATE 03/10/14 (LL): Patient is tolerating tinnitus with TPX 100 mg bid for less frequent, less severe headaches. No other known side effects. Pleased with current treatment. Planning right total knee replacement in October.  UPDATE 09/09/13: Since last visit, was doing well until she developed tinnitus. Her PCP advised her to reduce TPX. She reduced to '50mg'$  BID, then to '25mg'$  BID. Tinnitus improved, but then HA worsened. Now back to '100mg'$  BID, but tinnitus has returned. 2 weeks ago had migraine (HA + nausea), took tylenol and went to sleep with resolution of HA.   UPDATE 09/07/12: Was doing better, then more HA x last 2 months. Not sleeping well at night. Wakes up multiple times to check on husband (who falls asleep in chair). Not much physical activity. HA are similar quality as before.   UPDATE 03/05/12: Doing better. No headaches since last visit. She has had noticed floaters that appear like a feather; notices in am when she may not have had enough sleep, last episode 2 weeks ago. She has a history of floaters since age 75. Tolerating TPX '100mg'$  qhs without any memory or focus problems or numbness/tingling.   UPDATE 10/31/11: Doing little bit better. On TPX '100mg'$  qhs; couldn't tolerate BID dosing. No side effects. Sinus pressure sensation has improved.   PRIOR HPI: 75 year old ambidextrous, right dominant, female with history of pulmonary sarcoidosis, obstructive sleep apnea, osteoarthritis, here for evaluation of headaches since October 2012. Patient reports feeling sinus infection symptoms and frontal headache in October 2012. She was prescribed azithromycin. Her symptoms did not improve. She was then referred  to ENT, had CT of the sinuses, which showed no significant sinus disease. For the past 2 months she's been taking tramadol and ibuprofen on a daily basis for knee pain. At the onset of the headache symptom she was taking Sudafed but no other pain medications.  Patient reports pressure and mild  throbbing sensation in the bifrontal and bitemporal regions. No nausea, vomiting, photophobia or visual scotoma. Sometimes she has mild blurred vision and phonophobia with her headaches. Headache severity is 2/10 on a daily basis, sometimes increased to 4 or 5/10 once a week.    REVIEW OF SYSTEMS: Full 14 system review of systems performed and negative: only for hearing loss ringing in ears eye redness light sens headaches restless legs apnea.    ALLERGIES: Allergies  Allergen Reactions  . Metoclopramide Hcl Other (See Comments)    REGLAN="burning in mouth"  . Oxymetazoline Hcl Itching    AFRIN SPRAY  . Sucralfate Other (See Comments)    CARAFATE=Mouth sores    HOME MEDICATIONS: Outpatient Medications Prior to Visit  Medication Sig Dispense Refill  . Alum Hydroxide-Mag Carbonate (GAVISCON PO) Take 1 tablet by mouth as needed (reflux). As directed as needed    . aspirin 81 MG tablet Take 81 mg by mouth daily.    . beclomethasone (QVAR) 40 MCG/ACT inhaler Inhale 2 puffs into the lungs 2 (two) times daily. 1 Inhaler 11  . Cholecalciferol (VITAMIN D PO) Take 1,000 Units by mouth daily.     Marland Kitchen CREON 24000 UNITS CPEP TAKE 1 CAPSULE (24,000 UNITS TOTAL) BY MOUTH 3 (THREE) TIMES DAILY. 270 capsule 2  . cyanocobalamin (,VITAMIN B-12,) 1000 MCG/ML injection Inject 1 mL (1,000 mcg total) into the muscle every 14 (fourteen) days. 10 mL 2  . Flaxseed, Linseed, (FLAXSEED OIL) OIL Take 1 capsule by mouth 2 (two) times daily.      Marland Kitchen GLUCOSAMINE-CHONDROITIN-MSM-D3 PO Take 1 tablet by mouth 2 (two) times daily.    Marland Kitchen levothyroxine (SYNTHROID, LEVOTHROID) 100 MCG tablet TAKE 1 TABLET EVERY DAY 90 tablet 1  . magnesium oxide (MAG-OX) 400 MG tablet Take 400 mg by mouth at bedtime.    . Multiple Vitamins-Minerals (ICAPS AREDS FORMULA PO) Take 1 capsule by mouth daily.      Marland Kitchen omeprazole-sodium bicarbonate (ZEGERID) 40-1100 MG capsule TAKE 1 CAPSULE BY MOUTH DAILY BEFORE BREAKFAST. 90 capsule 0  . OVER THE  COUNTER MEDICATION Take 5 tablets by mouth 2 (two) times daily. Supplement - [place]    . Polyethyl Glycol-Propyl Glycol (SYSTANE ULTRA) 0.4-0.3 % SOLN Apply 1 drop to eye as needed (dry eyes).     . topiramate (TOPAMAX) 100 MG tablet Take 1 tablet (100 mg total) by mouth at bedtime. 90 tablet 4  . traMADol (ULTRAM) 50 MG tablet Take 100 mg by mouth at bedtime.     . vitamin C (ASCORBIC ACID) 500 MG tablet Take 500 mg by mouth daily.      Marland Kitchen zolpidem (AMBIEN) 5 MG tablet TAKE 1 TABLET AT BEDTIME AS NEEDED FOR SLEEP 30 tablet 3  . predniSONE (DELTASONE) 5 MG tablet Take 2 tabs daily w/ food x 7 days, then 1 tab x 7 days, then stop 21 tablet 0   No facility-administered medications prior to visit.     PHYSICAL EXAM Vitals:   05/13/16 0805  BP: 123/71  BP Location: Left Arm  Patient Position: Sitting  Cuff Size: Normal  Pulse: 71  Weight: 170 lb 9.6 oz (77.4 kg)  Height: 5' 3.5" (1.613 m)   Body mass index is 29.75 kg/m.   GENERAL EXAM: Patient is in no distress; well developed, nourished and groomed; neck is supple  CARDIOVASCULAR: Regular rate and rhythm, no murmurs, no carotid bruits  NEUROLOGIC: MENTAL STATUS: awake, alert, language fluent, comprehension intact, naming intact, fund of knowledge appropriate CRANIAL NERVE: pupils equal and reactive to light, visual fields full to confrontation, extraocular muscles intact, no nystagmus, facial sensation and strength symmetric, hearing intact, palate elevates symmetrically, uvula midline, shoulder shrug symmetric, tongue midline. MOTOR: normal bulk and tone, full strength in the BUE, BLE SENSORY: normal and symmetric to light touch, temperature, vibration COORDINATION: finger-nose-finger, fine finger movements normal REFLEXES: deep tendon reflexes present and symmetric GAIT/STATION: narrow based gait; romberg is negative    DIAGNOSTIC DATA (LABS, IMAGING, TESTING)  06/20/11 CT sinus - no significant sinus  disease; mild bulging of left nasal concha   09/16/11 MRI brain - left CP angle arachnoid cyst, otherwise normal   09/09/11 ESR, CRP - normal   10/10/14 MRI brain - normal   10/10/14 MRI cervical spine -  degenerative changes at C5-C6, C6-C7 and T1-T2. At C5-C6 there is uncovertebral spurring, mild disc protrusion and facet hypertrophy. However, the neural foramen just mildly narrowed and there is no nerve root impingement. The degenerative changes at C6-C7 and T1 T2 are milder and there is no nerve root impingement.  10/10/14 carotid u/s - normal     ASSESSMENT: 75 y.o. female here with pulmonary sarcoidosis, sleep apnea, previously evaluated for intermittent headaches (migraine variant).   S/p fall and head trauma Aug 20, 2014. With intermittent right hand fingertip numbness recently. Ongoing headaches and neck pain --> Now resolved.   Dx: post-traumatic headache/migraine   Post-traumatic headache, not intractable, unspecified chronicity pattern  Benign paroxysmal positional vertigo of right ear  Varicose veins of leg with swelling, left    PLAN:  - continue TPX 111m at bedtime; drink plenty of water - may consider vestibular PT in future if positional vertigo increases - monitor left leg intermittent swelling; advised on physical activity, elevating legs and using compression stocking; if constant swelling or increased pain, may need u/s of leg and PCP follow up  Meds ordered this encounter  Medications  . topiramate (TOPAMAX) 100 MG tablet    Sig: Take 1 tablet (100 mg total) by mouth at bedtime.    Dispense:  90 tablet    Refill:  4   Return in about 1 year (around 05/13/2017).     VPenni Bombard MD 102/06/1734 86:70AM Certified in Neurology, Neurophysiology and Neuroimaging  [facility] [address] [place] [phone]

## 2016-05-17 ENCOUNTER — Encounter: Payer: Self-pay | Admitting: Pulmonary Disease

## 2016-05-17 MED ORDER — BECLOMETHASONE DIPROPIONATE 40 MCG/ACT IN AERS: 2.0000 | INHALATION_SPRAY | Freq: Two times a day (BID) | RESPIRATORY_TRACT | 0 refills | Status: DC

## 2016-05-17 NOTE — Telephone Encounter (Signed)
Please advise Dr Elsworth Soho on an alternative medication for QVAR. Thanks.   ----- Message -----    From: [name]    Sent: 05/17/2016 10:47 AM EDT      To: Rigoberto Noel., MD Subject: Non-Urgent Medical Question  My insurance will not cover my Qvar inhaler anymore and I cannot afford the monthly payment for it.  [facility] some alternative inhalers which the insurance company suggested, but I don't think they have received anything from you.  I hate changing since Qvar has worked so well, but I am almost out and need to try something else soon.  Please call in a prescription for something soon.  Thank you.  Samples(x2) left up front for the patient to use in the meantime until Dr Elsworth Soho returns from vacation.

## 2016-05-19 NOTE — Telephone Encounter (Signed)
Pl let me know -what alternatives are covered by insurance Flovent ?

## 2016-05-19 NOTE — Telephone Encounter (Signed)
Pt states Arnuity and Flovent are covered.   Dr. Elsworth Soho, please advise. Thanks.

## 2016-05-20 NOTE — Telephone Encounter (Signed)
change to flovent 88 1 puff twice daily

## 2016-06-02 ENCOUNTER — Encounter: Payer: Self-pay | Admitting: Internal Medicine

## 2016-06-02 NOTE — Telephone Encounter (Signed)
RA please advise, Flovent does not come in 88.

## 2016-06-08 ENCOUNTER — Other Ambulatory Visit: Payer: Self-pay | Admitting: Internal Medicine

## 2016-06-16 ENCOUNTER — Encounter: Payer: Self-pay | Admitting: Internal Medicine

## 2016-07-08 ENCOUNTER — Other Ambulatory Visit: Payer: Self-pay

## 2016-07-19 ENCOUNTER — Other Ambulatory Visit: Payer: Self-pay | Admitting: Diagnostic Neuroimaging

## 2016-08-01 HISTORY — PX: OTHER SURGICAL HISTORY: SHX169

## 2016-08-25 DIAGNOSIS — S93492A Sprain of other ligament of left ankle, initial encounter: Secondary | ICD-10-CM | POA: Diagnosis not present

## 2016-09-05 ENCOUNTER — Encounter: Payer: Self-pay | Admitting: Internal Medicine

## 2016-09-05 ENCOUNTER — Ambulatory Visit: Payer: Medicare Other | Attending: Student | Admitting: Physical Therapy

## 2016-09-05 ENCOUNTER — Ambulatory Visit (INDEPENDENT_AMBULATORY_CARE_PROVIDER_SITE_OTHER): Payer: Medicare Other | Admitting: Internal Medicine

## 2016-09-05 VITALS — BP 132/72 | HR 73 | Temp 98.5°F | Resp 20 | Wt 166.5 lb

## 2016-09-05 DIAGNOSIS — E559 Vitamin D deficiency, unspecified: Secondary | ICD-10-CM

## 2016-09-05 DIAGNOSIS — M25572 Pain in left ankle and joints of left foot: Secondary | ICD-10-CM

## 2016-09-05 DIAGNOSIS — M25672 Stiffness of left ankle, not elsewhere classified: Secondary | ICD-10-CM | POA: Diagnosis not present

## 2016-09-05 DIAGNOSIS — Z780 Asymptomatic menopausal state: Secondary | ICD-10-CM | POA: Diagnosis not present

## 2016-09-05 DIAGNOSIS — E538 Deficiency of other specified B group vitamins: Secondary | ICD-10-CM | POA: Diagnosis not present

## 2016-09-05 DIAGNOSIS — J069 Acute upper respiratory infection, unspecified: Secondary | ICD-10-CM | POA: Insufficient documentation

## 2016-09-05 DIAGNOSIS — M8580 Other specified disorders of bone density and structure, unspecified site: Secondary | ICD-10-CM | POA: Diagnosis not present

## 2016-09-05 DIAGNOSIS — D869 Sarcoidosis, unspecified: Secondary | ICD-10-CM

## 2016-09-05 MED ORDER — AZITHROMYCIN 250 MG PO TABS: ORAL_TABLET | ORAL | 0 refills | Status: DC

## 2016-09-05 NOTE — Assessment & Plan Note (Signed)
Vit D BDS at [facility]

## 2016-09-05 NOTE — Assessment & Plan Note (Signed)
On B12 

## 2016-09-05 NOTE — Progress Notes (Signed)
Subjective:  Patient ID: Elizabeth Gray, female    DOB: [birth year]  Age: 76 y.o. MRN: [id number]  CC: No chief complaint on file.   HPI YASMEN GALLICK presents for a URI sx's x 2 weeks. F/u HTN, OA  Outpatient Medications Prior to Visit  Medication Sig Dispense Refill  . Alum Hydroxide-Mag Carbonate (GAVISCON PO) Take 1 tablet by mouth as needed (reflux). As directed as needed    . aspirin 81 MG tablet Take 81 mg by mouth daily.    . beclomethasone (QVAR) 40 MCG/ACT inhaler Inhale 2 puffs into the lungs 2 (two) times daily. 1 Inhaler 11  . Cholecalciferol (VITAMIN D PO) Take 1,000 Units by mouth daily.     Marland Kitchen CREON 24000 UNITS CPEP TAKE 1 CAPSULE (24,000 UNITS TOTAL) BY MOUTH 3 (THREE) TIMES DAILY. 270 capsule 2  . cyanocobalamin (,VITAMIN B-12,) 1000 MCG/ML injection Inject 1 mL (1,000 mcg total) into the muscle every 14 (fourteen) days. 10 mL 2  . Flaxseed, Linseed, (FLAXSEED OIL) OIL Take 1 capsule by mouth 2 (two) times daily.      Marland Kitchen GLUCOSAMINE-CHONDROITIN-MSM-D3 PO Take 1 tablet by mouth 2 (two) times daily.    Marland Kitchen levothyroxine (SYNTHROID, LEVOTHROID) 100 MCG tablet TAKE 1 TABLET EVERY DAY 90 tablet 1  . magnesium oxide (MAG-OX) 400 MG tablet Take 400 mg by mouth at bedtime.    . Multiple Vitamins-Minerals (ICAPS AREDS FORMULA PO) Take 1 capsule by mouth daily.      Marland Kitchen omeprazole-sodium bicarbonate (ZEGERID) 40-1100 MG capsule TAKE 1 CAPSULE BY MOUTH DAILY BEFORE BREAKFAST. 90 capsule 0  . OVER THE COUNTER MEDICATION Take 5 tablets by mouth 2 (two) times daily. Supplement - [place]    . Polyethyl Glycol-Propyl Glycol (SYSTANE ULTRA) 0.4-0.3 % SOLN Apply 1 drop to eye as needed (dry eyes).     . topiramate (TOPAMAX) 100 MG tablet Take 1 tablet (100 mg total) by mouth at bedtime. 90 tablet 4  . traMADol (ULTRAM) 50 MG tablet Take 100 mg by mouth at bedtime.     . vitamin C (ASCORBIC ACID) 500 MG tablet Take 500 mg by mouth daily.      Marland Kitchen zolpidem (AMBIEN) 5 MG tablet  TAKE 1 TABLET AT BEDTIME AS NEEDED FOR SLEEP 30 tablet 3  . beclomethasone (QVAR) 40 MCG/ACT inhaler Inhale 2 puffs into the lungs 2 (two) times daily. 1 Inhaler 0   No facility-administered medications prior to visit.     ROS Review of Systems  Constitutional: Negative for activity change, appetite change, chills, fatigue and unexpected weight change.  HENT: Positive for congestion and rhinorrhea. Negative for mouth sores and sinus pressure.   Eyes: Negative for visual disturbance.  Respiratory: Positive for cough. Negative for chest tightness.   Gastrointestinal: Negative for abdominal pain and nausea.  Genitourinary: Negative for difficulty urinating, frequency and vaginal pain.  Musculoskeletal: Positive for arthralgias. Negative for back pain and gait problem.  Skin: Negative for pallor and rash.  Neurological: Negative for dizziness, tremors, weakness, numbness and headaches.  Psychiatric/Behavioral: Negative for confusion and sleep disturbance.    Objective:  BP 132/72   Pulse 73   Temp 98.5 F (36.9 C) (Oral)   Resp 20   Wt 166 lb 8 oz (75.5 kg)   SpO2 95%   BMI 29.03 kg/m   BP Readings from Last 3 Encounters:  09/05/16 132/72  05/13/16 123/71  03/04/16 124/80    Wt Readings from Last 3 Encounters:  09/05/16 166 lb 8 oz (75.5 kg)  05/13/16 170 lb 9.6 oz (77.4 kg)  03/04/16 167 lb (75.8 kg)    Physical Exam  Constitutional: She appears well-developed. No distress.  HENT:  Head: Normocephalic.  Right Ear: External ear normal.  Left Ear: External ear normal.  Nose: Nose normal.  Mouth/Throat: Oropharynx is clear and moist.  Eyes: Conjunctivae are normal. Pupils are equal, round, and reactive to light. Right eye exhibits no discharge. Left eye exhibits no discharge.  Neck: Normal range of motion. Neck supple. No JVD present. No tracheal deviation present. No thyromegaly present.  Cardiovascular: Normal rate, regular rhythm and normal heart sounds.     Pulmonary/Chest: No stridor. No respiratory distress. She has no wheezes.  Abdominal: Soft. Bowel sounds are normal. She exhibits no distension and no mass. There is no tenderness. There is no rebound and no guarding.  Musculoskeletal: She exhibits no edema or tenderness.  Lymphadenopathy:    She has no cervical adenopathy.  Neurological: She displays normal reflexes. No cranial nerve deficit. She exhibits normal muscle tone. Coordination normal.  Skin: No rash noted. No erythema.  Psychiatric: She has a normal mood and affect. Her behavior is normal. Judgment and thought content normal.  eryth throat  Lab Results  Component Value Date   WBC 6.0 04/01/2016   HGB 12.6 04/01/2016   HCT 37.0 04/01/2016   PLT 347.0 04/01/2016   GLUCOSE 109 (H) 04/01/2016   CHOL 149 04/01/2016   TRIG 126.0 04/01/2016   HDL 60.40 04/01/2016   LDLCALC 63 04/01/2016   ALT 20 04/01/2016   AST 21 04/01/2016   NA 142 04/01/2016   K 4.1 04/01/2016   CL 108 04/01/2016   CREATININE 0.77 04/01/2016   BUN 17 04/01/2016   CO2 28 04/01/2016   TSH 0.28 (L) 04/01/2016   INR 3.03 (H) 05/12/2014    Dg Chest 2 View  Result Date: 02/29/2016 CLINICAL DATA:  Dry cough for 3 weeks EXAM: CHEST  2 VIEW COMPARISON:  April 28, 2014 FINDINGS: The heart size and mediastinal contours are within normal limits. Both lungs are clear. The visualized skeletal structures are unremarkable. IMPRESSION: No active cardiopulmonary disease. Electronically Signed   By: Dorise Bullion III M.D   On: 02/29/2016 10:00   Assessment & Plan:   There are no diagnoses linked to this encounter. I am having Ms. Defoor maintain her Flaxseed Oil, Alum Hydroxide-Mag Carbonate (GAVISCON PO), Multiple Vitamins-Minerals (ICAPS AREDS FORMULA PO), vitamin C, traMADol, Cholecalciferol (VITAMIN D PO), aspirin, Polyethyl Glycol-Propyl Glycol, magnesium oxide, OVER THE COUNTER MEDICATION, GLUCOSAMINE-CHONDROITIN-MSM-D3 PO, CREON, cyanocobalamin,  levothyroxine, zolpidem, beclomethasone, topiramate, beclomethasone, and omeprazole-sodium bicarbonate.  No orders of the defined types were placed in this encounter.    Follow-up: No Follow-up on file.  Walker Kehr, MD

## 2016-09-05 NOTE — Assessment & Plan Note (Signed)
Qvar  

## 2016-09-05 NOTE — Therapy (Signed)
[facility] [place], [place], [address] Phone: [phone]   Fax:  [phone]  Physical Therapy Evaluation  Patient Details  Name: Elizabeth Gray MRN: [id number] Date of Birth: [birth year] Referring Provider: Wylene Simmer MD.  Encounter Date: 09/05/2016      PT End of Session - 09/05/16 1144    Visit Number 1   Number of Visits 12   Date for PT Re-Evaluation 11/04/16   PT Start Time 1117   PT Stop Time 1205   PT Time Calculation (min) 48 min   Activity Tolerance Patient tolerated treatment well   Behavior During Therapy [facility] for tasks assessed/performed      Past Medical History:  Diagnosis Date  . Bursitis   . External hemorrhoids without mention of complication [id number]   Colonoscopy   . Fatigue   . GERD (gastroesophageal reflux disease)   . Headache(784.0)    migraines  . Heat rash    under the breasts.Marland Kitchenappeared on monday.Marland KitchenMarland KitchenBurning & itching, uses cortisone  . Hypothyroidism   . Iron deficiency anemia, unspecified   . Lower esophageal ring 2008   EGD  . Nonorganic sleep disorder, unspecified   . Osteoarthritis   . Pancreatitis   . Personal history of colonic polyps   . Polio 1948  . Pulmonary sarcoidosis ([place])   . Sleep apnea    cpap since 09 [facility]  . Vitamin B12 deficiency   . Vitamin D deficiency     Past Surgical History:  Procedure Laterality Date  . APPENDECTOMY  1988  . CARPAL TUNNEL RELEASE  2002   rt  . CHOLECYSTECTOMY  2000  . COLONOSCOPY  12/19/2006   Multiple diminutive polyps destroyed-removed (hyperpastic), internal hemorrhoids  . ESOPHAGOGASTRODUODENOSCOPY  12/19/2006   lower esophageal ring dilated  . HAND SURGERY  1997   left  . KNEE ARTHROSCOPY Bilateral    multiple 2 on lft 1 on rt  . [place] SURGERY  1995  . TOE SURGERY Left    left foot next to little toe  joint rem  . TOTAL KNEE ARTHROPLASTY Right 05/09/2014   Procedure: RIGHT TOTAL KNEE  ARTHROPLASTY;  Surgeon: [name], MD;  Location: [place];  Service: Orthopedics;  Laterality: Right;    There were no vitals filed for this visit.       Subjective Assessment - 09/05/16 1137    Subjective The patient prsents to OPPT with a diagnosis of a left ankle sprain.  She reports an orginal injury many years ago but a more recent flare-up in which her left ankle swelled a great deal.  She was seen at an Orthopedic office and was placed in a left ASO.  Her pain is rated at a 4/10 and higher with increased up time.   Limitations Walking   How long can you walk comfortably? Short community distances.   Patient Stated Goals Get out of pain.   Currently in Pain? Yes   Pain Score 4    Pain Location Ankle   Pain Orientation Left   Pain Descriptors / Indicators Aching   Pain Type Chronic pain   Pain Onset More than a month ago   Pain Frequency Constant   Aggravating Factors  See above.   Pain Relieving Factors See above.            [facility] PT Assessment - 09/05/16 0001      Assessment   Medical Diagnosis Left ankle sprain.  Referring Provider Wylene Simmer MD.   Onset Date/Surgical Date --  Most recent 3-4 months ago.     Precautions   Required Braces or Orthoses --  Left ASO.     Restrictions   Weight Bearing Restrictions No     Balance Screen   Has the patient fallen in the past 6 months No   Has the patient had a decrease in activity level because of a fear of falling?  No   Is the patient reluctant to leave their home because of a fear of falling?  No     Home Environment   Living Environment Private residence     ROM / Strength   AROM / PROM / Strength AROM;Strength     AROM   Overall AROM Comments Left active ankle dorsiflexion to 5 degrees.  Other motions WNL.     Strength   Overall Strength Comments Left ankle eversion= 4/5.     Palpation   Palpation comment Tender to palpation over left ATFL and anterior band of deltoid ligament.      Ambulation/Gait   Gait Comments WNL.                   [facility] Adult PT Treatment/Exercise - 09/05/16 0001      Modalities   Modalities Electrical Stimulation;Vasopneumatic     Electrical Stimulation   Electrical Stimulation Location Left ankle.   Electrical Stimulation Action Constant pre-mod.   Electrical Stimulation Parameters 80-150 Hz. x 15 minutes.   Electrical Stimulation Goals Pain     Vasopneumatic   Number Minutes Vasopneumatic  --  15 minutes.   Vasopnuematic Location  --  Left ankle.   Vasopneumatic Pressure Medium                  PT Short Term Goals - 09/05/16 1210      PT SHORT TERM GOAL #1   Title STG's=LTG's.           PT Long Term Goals - 09/05/16 1211      PT LONG TERM GOAL #1   Title independent with a HEP.   Time 8   Period Weeks   Status New     PT LONG TERM GOAL #2   Title Increase left ankle dorsiflexion to 10 degrees to normalize the patient's gait pattern   Time 8   Period Weeks   Status New     PT LONG TERM GOAL #3   Title Increase left ankle strength to 5/5 to increase stability for functional tasks   Time 8   Period Weeks   Status New     PT LONG TERM GOAL #4   Title Perform ADL's with pain not > 2-3/10.   Time 8   Period Weeks   Status New     PT LONG TERM GOAL #5   Title No episodes of left ankle instability.   Time 8   Period Weeks   Status New               Plan - 09/05/16 1205    Clinical Impression Statement The patient prsents with a chronic left ankle pain that was recently exacerbated 3-4 months ago in which her ankle swelled a great deal.  She reports her left ankle feels unstable when walking on uneven surfaces.  She has a minimal loss of active left ankle dorsiflexion and a minimal loss of left ankle eversion strength.  She is tender over medial and lateral  ankle ligaments.     Rehab Potential Excellent   PT Frequency 2x / week   PT Duration 6 weeks   PT Treatment/Interventions  Cryotherapy;Electrical Stimulation;Moist Heat;Ultrasound;Patient/family education;Neuromuscular re-education;Therapeutic exercise;Therapeutic activities;Manual techniques;Passive range of motion   PT Next Visit Plan Theraband exercise for left ankle strengthening (esp eversion); dynadisc; ankle isolator; electrical stimulation and vasopneumatic.   Consulted and Agree with Plan of Care Patient      Patient will benefit from skilled therapeutic intervention in order to improve the following deficits and impairments:  Pain, Decreased activity tolerance, Decreased strength, Decreased range of motion  Visit Diagnosis: Pain in left ankle and joints of left foot - Plan: PT plan of care cert/re-cert  Stiffness of left ankle, not elsewhere classified - Plan: PT plan of care cert/re-cert      G-Codes - [id number]    Functional Assessment Tool Used FOTO 51% limitation.   Functional Limitation Mobility: Walking and moving around   Mobility: Walking and Moving Around Current Status [phone]) At least 40 percent but less than 60 percent impaired, limited or restricted   Mobility: Walking and Moving Around Goal Status [phone]) At least 1 percent but less than 20 percent impaired, limited or restricted       Problem List Patient Active Problem List   Diagnosis Date Noted  . Osteopenia 09/05/2016  . Upper respiratory infection 09/05/2016  . Chest pain 03/04/2016  . Degenerative arthritis of right knee 05/09/2014  . Headache(784.0) 07/19/2013  . Tinnitus 07/19/2013  . Dry skin dermatitis 08/31/2012  . Left hip pain 08/31/2012  . Cerumen impaction 08/31/2012  . Well adult exam 05/25/2012  . Upper abdominal pain-chronic 04/02/2011  . Thumb pain 03/11/2011  . Donor, blood 03/11/2011  . Shoulder pain 03/11/2011  . OSTEOARTHRITIS 09/10/2010  . VERTIGO 03/05/2010  . OSA on CPAP 07/22/2008  . B12 deficiency 08/16/2007  . Vitamin D deficiency 08/16/2007  . 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  SARCOIDOSIS, PULMONARY 05/09/2007  . Hypothyroidism 05/09/2007  . ALLERGIC RHINITIS 05/09/2007  . GERD 05/09/2007  . Personal history of colon polyps 05/09/2007    Royalti Schauf, [place] MPT 09/05/2016, 12:16 PM  [facility] [address] [place], [place], [address] Phone: [phone]   Fax:  [phone]  Name: Elizabeth Gray MRN: [id number] Date of Birth: 1941-03-02

## 2016-09-05 NOTE — Assessment & Plan Note (Signed)
Vit D 

## 2016-09-05 NOTE — Progress Notes (Signed)
Pre visit review using our clinic review tool, if applicable. No additional management support is needed unless otherwise documented below in the visit note. 

## 2016-09-05 NOTE — Assessment & Plan Note (Signed)
Zpac if worse 

## 2016-09-09 ENCOUNTER — Ambulatory Visit: Payer: Medicare Other | Admitting: Physical Therapy

## 2016-09-09 DIAGNOSIS — M25672 Stiffness of left ankle, not elsewhere classified: Secondary | ICD-10-CM

## 2016-09-09 DIAGNOSIS — M25572 Pain in left ankle and joints of left foot: Secondary | ICD-10-CM

## 2016-09-09 NOTE — Patient Instructions (Signed)
ANKLE: Dorsiflexion (Band)    Sit at edge of surface. Place band around top of foot. Keeping heel on floor, raise toes of banded foot. Hold _1-2__ seconds. Use __red___ band. __15_ reps per set, 1-2___ sets per day, _6-7__ days per week  Copyright  VHI. All rights reserved.    ANKLE: Eversion, Unilateral (Band)    Place band around left foot. Keeping heel in place, raise toes of banded foot up and away from body. Do not move hip. Hold _1-2__ seconds. Use ___red_____ band. __15_ reps per set, _1-2__ sets per day, _6-7__ days per week  Copyright  VHI. All rights reserved.    ANKLE: Inversion, Unilateral (Band)    Placing band around left foot. Keeping heel in place, lift toes of banded foot up and in. Do not move hip. Hold _1-2__ seconds. Use ___red_____ band. 15___ reps per set, _1-2__ sets per day, __6-7_ days per week  Copyright  VHI. All rights reserved.    ANKLE: Plantarflexion - Sitting (Band)    Place band around foot; hold other end. Sit at edge of sitting surface. Keep heel in place. Push foot down against band. Hold _1-2__ seconds. Use __red______ band. _15__ reps per set, _1-2__ sets per day, _6-7__ days per week  Copyright  VHI. All rights reserved.

## 2016-09-09 NOTE — Therapy (Signed)
[facility] [place], [place], [address] Phone: [phone]   Fax:  [phone]  Physical Therapy Treatment  Patient Details  Name: Elizabeth Gray MRN: [id number] Date of Birth: 17-Dec-1940 Referring Provider: Wylene Simmer MD.  Encounter Date: 09/09/2016      PT End of Session - 09/09/16 0848    Visit Number 2   Number of Visits 12   Date for PT Re-Evaluation 11/04/16   PT Start Time 0813   PT Stop Time 0900   PT Time Calculation (min) 47 min   Activity Tolerance Patient tolerated treatment well   Behavior During Therapy [facility] for tasks assessed/performed      Past Medical History:  Diagnosis Date  . Bursitis   . External hemorrhoids without mention of complication 0109   Colonoscopy   . Fatigue   . GERD (gastroesophageal reflux disease)   . Headache(784.0)    migraines  . Heat rash    under the breasts.Marland Kitchenappeared on monday.Marland KitchenMarland KitchenBurning & itching, uses cortisone  . Hypothyroidism   . Iron deficiency anemia, unspecified   . Lower esophageal ring 2008   EGD  . Nonorganic sleep disorder, unspecified   . Osteoarthritis   . Pancreatitis   . Personal history of colonic polyps   . Polio 1948  . Pulmonary sarcoidosis ([place])   . Sleep apnea    cpap since 09 [facility]  . Vitamin B12 deficiency   . Vitamin D deficiency     Past Surgical History:  Procedure Laterality Date  . APPENDECTOMY  1988  . CARPAL TUNNEL RELEASE  2002   rt  . CHOLECYSTECTOMY  2000  . COLONOSCOPY  12/19/2006   Multiple diminutive polyps destroyed-removed (hyperpastic), internal hemorrhoids  . ESOPHAGOGASTRODUODENOSCOPY  12/19/2006   lower esophageal ring dilated  . HAND SURGERY  1997   left  . KNEE ARTHROSCOPY Bilateral    multiple 2 on lft 1 on rt  . [place] SURGERY  1995  . TOE SURGERY Left    left foot next to little toe  joint rem  . TOTAL KNEE ARTHROPLASTY Right 05/09/2014   Procedure: RIGHT TOTAL KNEE  ARTHROPLASTY;  Surgeon: [name], MD;  Location: [place];  Service: Orthopedics;  Laterality: Right;    There were no vitals filed for this visit.      Subjective Assessment - 09/09/16 0814    Subjective "it's there."   Limitations Walking   How long can you walk comfortably? Short community distances.   Patient Stated Goals Get out of pain.   Currently in Pain? Yes   Pain Score 4    Pain Location Ankle   Pain Orientation Left   Pain Descriptors / Indicators Aching   Pain Type Chronic pain   Pain Onset More than a month ago   Pain Frequency Constant   Aggravating Factors  walking   Pain Relieving Factors medication                         [facility] Adult PT Treatment/Exercise - 09/09/16 0817      Exercises   Exercises Ankle     Electrical Stimulation   Electrical Stimulation Location Left ankle.   Electrical Stimulation Action pre mod   Electrical Stimulation Parameters to tolerance x 15 min   Electrical Stimulation Goals Pain     Vasopneumatic   Number Minutes Vasopneumatic  15 minutes   Vasopnuematic Location  Ankle  Vasopneumatic Pressure Medium   Vasopneumatic Temperature  max cold     Ankle Exercises: Aerobic   Stationary Bike NuStep L5x10 min     Ankle Exercises: Standing   SLS LLE on compliant surface 3x30 sec with intermittent UE support     Ankle Exercises: Supine   T-Band 4 way seated x 15 reps; red theraband on Lt                PT Education - 09/09/16 0848    Education provided Yes   Education Details HEP   Person(s) Educated Patient   Methods Explanation;Demonstration;Handout   Comprehension Verbalized understanding;Returned demonstration          PT Short Term Goals - 09/05/16 1210      PT SHORT TERM GOAL #1   Title STG's=LTG's.           PT Long Term Goals - 09/05/16 1211      PT LONG TERM GOAL #1   Title independent with a HEP.   Time 8   Period Weeks   Status New     PT LONG TERM GOAL #2    Title Increase left ankle dorsiflexion to 10 degrees to normalize the patient's gait pattern   Time 8   Period Weeks   Status New     PT LONG TERM GOAL #3   Title Increase left ankle strength to 5/5 to increase stability for functional tasks   Time 8   Period Weeks   Status New     PT LONG TERM GOAL #4   Title Perform ADL's with pain not > 2-3/10.   Time 8   Period Weeks   Status New     PT LONG TERM GOAL #5   Title No episodes of left ankle instability.   Time 8   Period Weeks   Status New               Plan - 09/09/16 9211    Clinical Impression Statement Initiated HEP and pt tolerated well with min increase in soreness and muscle fatigue.  Tolerated exercises well with appropriate response.  No goals met as only 2nd visit.  Will continue to benefit from PT to maximize function.   PT Treatment/Interventions Cryotherapy;Electrical Stimulation;Moist Heat;Ultrasound;Patient/family education;Neuromuscular re-education;Therapeutic exercise;Therapeutic activities;Manual techniques;Passive range of motion   PT Next Visit Plan review HEP, dynadisc, ankle isolator, modalities PRN   Consulted and Agree with Plan of Care Patient      Patient will benefit from skilled therapeutic intervention in order to improve the following deficits and impairments:  Pain, Decreased activity tolerance, Decreased strength, Decreased range of motion  Visit Diagnosis: Pain in left ankle and joints of left foot  Stiffness of left ankle, not elsewhere classified     Problem List Patient Active Problem List   Diagnosis Date Noted  . Osteopenia 09/05/2016  . Upper respiratory infection 09/05/2016  . Chest pain 03/04/2016  . Degenerative arthritis of right knee 05/09/2014  . Headache(784.0) 07/19/2013  . Tinnitus 07/19/2013  . Dry skin dermatitis 08/31/2012  . Left hip pain 08/31/2012  . Cerumen impaction 08/31/2012  . Well adult exam 05/25/2012  . Upper abdominal pain-chronic  04/02/2011  . Thumb pain 03/11/2011  . Donor, blood 03/11/2011  . Shoulder pain 03/11/2011  . OSTEOARTHRITIS 09/10/2010  . VERTIGO 03/05/2010  . OSA on CPAP 07/22/2008  . B12 deficiency 08/16/2007  . Vitamin D deficiency 08/16/2007  . 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 SARCOIDOSIS, PULMONARY  05/09/2007  . Hypothyroidism 05/09/2007  . ALLERGIC RHINITIS 05/09/2007  . GERD 05/09/2007  . Personal history of colon polyps 05/09/2007      Laureen Abrahams, PT, DPT 09/09/16 8:55 AM    [facility] [address] [place], [place], [address] Phone: [phone]   Fax:  [phone]  Name: Elizabeth Gray MRN: [id number] Date of Birth: Apr 15, 1941

## 2016-09-12 ENCOUNTER — Ambulatory Visit: Payer: Medicare Other | Admitting: Physical Therapy

## 2016-09-12 ENCOUNTER — Encounter: Payer: Self-pay | Admitting: Physical Therapy

## 2016-09-12 DIAGNOSIS — M25672 Stiffness of left ankle, not elsewhere classified: Secondary | ICD-10-CM | POA: Diagnosis not present

## 2016-09-12 DIAGNOSIS — M25572 Pain in left ankle and joints of left foot: Secondary | ICD-10-CM | POA: Diagnosis not present

## 2016-09-12 NOTE — Therapy (Signed)
[facility] [place], [place], [address] Phone: [phone]   Fax:  [phone]  Physical Therapy Treatment  Patient Details  Name: Elizabeth Gray MRN: [id number] Date of Birth: Mar 27, 1941 Referring Provider: Wylene Simmer MD.  Encounter Date: 09/12/2016      PT End of Session - 09/12/16 0935    Visit Number 3   Number of Visits 12   Date for PT Re-Evaluation 11/04/16   PT Start Time 0901   PT Stop Time 0950   PT Time Calculation (min) 49 min   Activity Tolerance Patient tolerated treatment well   Behavior During Therapy [facility] for tasks assessed/performed      Past Medical History:  Diagnosis Date  . Bursitis   . External hemorrhoids without mention of complication [id number]   Colonoscopy   . Fatigue   . GERD (gastroesophageal reflux disease)   . Headache(784.0)    migraines  . Heat rash    under the breasts.Marland Kitchenappeared on monday.Marland KitchenMarland KitchenBurning & itching, uses cortisone  . Hypothyroidism   . Iron deficiency anemia, unspecified   . Lower esophageal ring 2008   EGD  . Nonorganic sleep disorder, unspecified   . Osteoarthritis   . Pancreatitis   . Personal history of colonic polyps   . Polio 1948  . Pulmonary sarcoidosis ([place])   . Sleep apnea    cpap since 09 [facility]  . Vitamin B12 deficiency   . Vitamin D deficiency     Past Surgical History:  Procedure Laterality Date  . APPENDECTOMY  1988  . CARPAL TUNNEL RELEASE  2002   rt  . CHOLECYSTECTOMY  2000  . COLONOSCOPY  12/19/2006   Multiple diminutive polyps destroyed-removed (hyperpastic), internal hemorrhoids  . ESOPHAGOGASTRODUODENOSCOPY  12/19/2006   lower esophageal ring dilated  . HAND SURGERY  1997   left  . KNEE ARTHROSCOPY Bilateral    multiple 2 on lft 1 on rt  . [place] SURGERY  1995  . TOE SURGERY Left    left foot next to little toe  joint rem  . TOTAL KNEE ARTHROPLASTY Right 05/09/2014   Procedure: RIGHT TOTAL KNEE  ARTHROPLASTY;  Surgeon: [name], MD;  Location: [place];  Service: Orthopedics;  Laterality: Right;    There were no vitals filed for this visit.      Subjective Assessment - 09/12/16 0905    Subjective Patient reports some ongoing soreness    Limitations Walking   How long can you walk comfortably? Short community distances.   Patient Stated Goals Get out of pain.   Currently in Pain? Yes   Pain Score 4    Pain Location Ankle   Pain Orientation Left   Pain Descriptors / Indicators Aching   Pain Type Chronic pain   Pain Onset More than a month ago   Pain Frequency Constant   Aggravating Factors  walking   Pain Relieving Factors medication                         [facility] Adult PT Treatment/Exercise - 09/12/16 0001      Electrical Stimulation   Electrical Stimulation Location Left ankle.   Water quality scientist Parameters 1-10hz  x46min   Electrical Stimulation Goals Pain     Vasopneumatic   Number Minutes Vasopneumatic  15 minutes   Vasopnuematic Location  Ankle   Vasopneumatic Pressure Medium     Ankle Exercises:  Aerobic   Stationary Bike NuStep L5x10 min     Ankle Exercises: Standing   Rocker Board 3 minutes   Other Standing Ankle Exercises dyna disc small circles 2x10     Ankle Exercises: Seated   Other Seated Ankle Exercises Ankle isolator 1# x20 DF, circles 2x10     Ankle Exercises: Supine   T-Band supine 4 way ankle with red t-band 2x10 each                  PT Short Term Goals - 09/05/16 1210      PT SHORT TERM GOAL #1   Title STG's=LTG's.           PT Long Term Goals - 09/12/16 0936      PT LONG TERM GOAL #1   Title independent with a HEP.   Time 8   Period Weeks   Status On-going     PT LONG TERM GOAL #2   Title Increase left ankle dorsiflexion to 10 degrees to normalize the patient's gait pattern   Time 8   Period Weeks   Status On-going     PT LONG TERM GOAL #3    Title Increase left ankle strength to 5/5 to increase stability for functional tasks   Time 8   Period Weeks   Status On-going     PT LONG TERM GOAL #4   Title Perform ADL's with pain not > 2-3/10.   Time 8   Period Weeks   Status On-going     PT LONG TERM GOAL #5   Title No episodes of left ankle instability.   Time 8   Period Weeks   Status On-going               Plan - 09/12/16 [id number]    Clinical Impression Statement Patient tolerated treatment well today. Patient has ongoing soreness in left ankle and some muscle fatigue esp with eversion exercise. Patient had no difficulty with control of ankle during strengthening just weakness after. Patient progressing with standing exercises. Current goals progresing yet ongoing due to strength and pain deficts.    Rehab Potential Excellent   PT Frequency 2x / week   PT Duration 6 weeks   PT Treatment/Interventions Cryotherapy;Electrical Stimulation;Moist Heat;Ultrasound;Patient/family education;Neuromuscular re-education;Therapeutic exercise;Therapeutic activities;Manual techniques;Passive range of motion   PT Next Visit Plan cont with POC and progress with strengthening / ankle stabilization    Consulted and Agree with Plan of Care Patient      Patient will benefit from skilled therapeutic intervention in order to improve the following deficits and impairments:  Pain, Decreased activity tolerance, Decreased strength, Decreased range of motion  Visit Diagnosis: Pain in left ankle and joints of left foot  Stiffness of left ankle, not elsewhere classified     Problem List Patient Active Problem List   Diagnosis Date Noted  . Osteopenia 09/05/2016  . Upper respiratory infection 09/05/2016  . Chest pain 03/04/2016  . Degenerative arthritis of right knee 05/09/2014  . Headache(784.0) 07/19/2013  . Tinnitus 07/19/2013  . Dry skin dermatitis 08/31/2012  . Left hip pain 08/31/2012  . Cerumen impaction 08/31/2012  . Well  adult exam 05/25/2012  . Upper abdominal pain-chronic 04/02/2011  . Thumb pain 03/11/2011  . Donor, blood 03/11/2011  . Shoulder pain 03/11/2011  . OSTEOARTHRITIS 09/10/2010  . VERTIGO 03/05/2010  . OSA on CPAP 07/22/2008  . B12 deficiency 08/16/2007  . Vitamin D deficiency 08/16/2007  . 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  SARCOIDOSIS, PULMONARY 05/09/2007  . Hypothyroidism 05/09/2007  . ALLERGIC RHINITIS 05/09/2007  . GERD 05/09/2007  . Personal history of colon polyps 05/09/2007    Phillips Climes, PTA 09/12/2016, 9:59 AM  [facility] [place], [place], [address] Phone: [phone]   Fax:  [phone]  Name: Elizabeth Gray MRN: [id number] Date of Birth: 03/14/1941

## 2016-09-14 ENCOUNTER — Other Ambulatory Visit: Payer: Self-pay | Admitting: Internal Medicine

## 2016-09-16 ENCOUNTER — Ambulatory Visit: Payer: Medicare Other | Admitting: Physical Therapy

## 2016-09-16 ENCOUNTER — Encounter: Payer: Self-pay | Admitting: Physical Therapy

## 2016-09-16 DIAGNOSIS — M25672 Stiffness of left ankle, not elsewhere classified: Secondary | ICD-10-CM | POA: Diagnosis not present

## 2016-09-16 DIAGNOSIS — M25572 Pain in left ankle and joints of left foot: Secondary | ICD-10-CM

## 2016-09-16 NOTE — Therapy (Signed)
[facility] [place], [place], [address] Phone: [phone]   Fax:  [phone]  Physical Therapy Treatment  Patient Details  Name: Elizabeth Gray MRN: [id number] Date of Birth: Sep 13, 1940 Referring Provider: Wylene Simmer MD.  Encounter Date: 09/16/2016      PT End of Session - 09/16/16 0733    Visit Number 4   Number of Visits 12   Date for PT Re-Evaluation 11/04/16   PT Start Time 0732   PT Stop Time 0818   PT Time Calculation (min) 46 min   Activity Tolerance Patient tolerated treatment well   Behavior During Therapy [facility] for tasks assessed/performed      Past Medical History:  Diagnosis Date  . Bursitis   . External hemorrhoids without mention of complication [id number]   Colonoscopy   . Fatigue   . GERD (gastroesophageal reflux disease)   . Headache(784.0)    migraines  . Heat rash    under the breasts.Marland Kitchenappeared on monday.Marland KitchenMarland KitchenBurning & itching, uses cortisone  . Hypothyroidism   . Iron deficiency anemia, unspecified   . Lower esophageal ring 2008   EGD  . Nonorganic sleep disorder, unspecified   . Osteoarthritis   . Pancreatitis   . Personal history of colonic polyps   . Polio 1948  . Pulmonary sarcoidosis ([place])   . Sleep apnea    cpap since 09 [facility]  . Vitamin B12 deficiency   . Vitamin D deficiency     Past Surgical History:  Procedure Laterality Date  . APPENDECTOMY  1988  . CARPAL TUNNEL RELEASE  2002   rt  . CHOLECYSTECTOMY  2000  . COLONOSCOPY  12/19/2006   Multiple diminutive polyps destroyed-removed (hyperpastic), internal hemorrhoids  . ESOPHAGOGASTRODUODENOSCOPY  12/19/2006   lower esophageal ring dilated  . HAND SURGERY  1997   left  . KNEE ARTHROSCOPY Bilateral    multiple 2 on lft 1 on rt  . [place] SURGERY  1995  . TOE SURGERY Left    left foot next to little toe  joint rem  . TOTAL KNEE ARTHROPLASTY Right 05/09/2014   Procedure: RIGHT TOTAL KNEE  ARTHROPLASTY;  Surgeon: [name], MD;  Location: [place];  Service: Orthopedics;  Laterality: Right;    There were no vitals filed for this visit.      Subjective Assessment - 09/16/16 0732    Subjective Reports that ankle is doing "pretty good." Reports that her L ankle began hurting in the shower this morning.   Limitations Walking   How long can you walk comfortably? Short community distances.   Patient Stated Goals Get out of pain.   Currently in Pain? Yes   Pain Score 3    Pain Location Ankle   Pain Orientation Left   Pain Descriptors / Indicators Discomfort   Pain Radiating Towards anterior L calf   Pain Onset More than a month ago   Aggravating Factors  Walking    Pain Relieving Factors Medication (Tramadol at pm, Tylenol during the day)            [facility] PT Assessment - 09/16/16 0001      Assessment   Medical Diagnosis Left ankle sprain.   Next MD Visit 10/07/2016     Restrictions   Weight Bearing Restrictions No                     [facility] Adult PT Treatment/Exercise - 09/16/16 0001  Exercises   Exercises Knee/Hip;Ankle     Knee/Hip Exercises: Standing   Hip Abduction AROM;Left;2 sets;10 reps;Knee straight     Knee/Hip Exercises: Supine   Straight Leg Raises AROM;Left;2 sets;10 reps   Straight Leg Raise with External Rotation AROM;Left;2 sets;10 reps     Modalities   Modalities Passenger transport manager Location L ankle   Electrical Stimulation Action Pre-Mod   Electrical Stimulation Parameters 80-150 hz x15 min   Electrical Stimulation Goals Pain     Vasopneumatic   Number Minutes Vasopneumatic  15 minutes   Vasopnuematic Location  Ankle   Vasopneumatic Pressure Medium   Vasopneumatic Temperature  58     Ankle Exercises: Aerobic   Stationary Bike NuStep L5x10 min     Ankle Exercises: Standing   Rocker Board 3 minutes   Heel Raises 20 reps   Toe Raise 20 reps    Other Standing Ankle Exercises dyna disc small circles 2x10     Ankle Exercises: Seated   ABC's 1 rep   Other Seated Ankle Exercises L ankle isolator 1# DF/Inv/Ev x20 reps each                  PT Short Term Goals - 09/05/16 1210      PT SHORT TERM GOAL #1   Title STG's=LTG's.           PT Long Term Goals - 09/12/16 0936      PT LONG TERM GOAL #1   Title independent with a HEP.   Time 8   Period Weeks   Status On-going     PT LONG TERM GOAL #2   Title Increase left ankle dorsiflexion to 10 degrees to normalize the patient's gait pattern   Time 8   Period Weeks   Status On-going     PT LONG TERM GOAL #3   Title Increase left ankle strength to 5/5 to increase stability for functional tasks   Time 8   Period Weeks   Status On-going     PT LONG TERM GOAL #4   Title Perform ADL's with pain not > 2-3/10.   Time 8   Period Weeks   Status On-going     PT LONG TERM GOAL #5   Title No episodes of left ankle instability.   Time 8   Period Weeks   Status On-going               Plan - 09/16/16 0805    Clinical Impression Statement Patient tolerated today's treatment well with no prolonged L ankle pain experienced with any of the  exercises per patient report. Ankle isolator strengthening completed in sitting with 1# and patient requiring multimodal cueing for ABCs technique. Patient educated to keep L ankle ASO close especially with walking or prolonged standing activities. L hip strengthening exercises completed as patient reports that she has been feeling hip discomfort. Normal modalities response noted following removal of the modalities. Patient experienced "a twinge" of L ankle soreness following today's treatment.   Rehab Potential Excellent   PT Frequency 2x / week   PT Duration 6 weeks   PT Treatment/Interventions Cryotherapy;Electrical Stimulation;Moist Heat;Ultrasound;Patient/family education;Neuromuscular re-education;Therapeutic  exercise;Therapeutic activities;Manual techniques;Passive range of motion   PT Next Visit Plan cont with POC and progress with strengthening / ankle stabilization    Consulted and Agree with Plan of Care Patient      Patient will benefit from skilled therapeutic intervention in  order to improve the following deficits and impairments:  Pain, Decreased activity tolerance, Decreased strength, Decreased range of motion  Visit Diagnosis: Pain in left ankle and joints of left foot  Stiffness of left ankle, not elsewhere classified     Problem List Patient Active Problem List   Diagnosis Date Noted  . Osteopenia 09/05/2016  . Upper respiratory infection 09/05/2016  . Chest pain 03/04/2016  . Degenerative arthritis of right knee 05/09/2014  . Headache(784.0) 07/19/2013  . Tinnitus 07/19/2013  . Dry skin dermatitis 08/31/2012  . Left hip pain 08/31/2012  . Cerumen impaction 08/31/2012  . Well adult exam 05/25/2012  . Upper abdominal pain-chronic 04/02/2011  . Thumb pain 03/11/2011  . Donor, blood 03/11/2011  . Shoulder pain 03/11/2011  . OSTEOARTHRITIS 09/10/2010  . VERTIGO 03/05/2010  . OSA on CPAP 07/22/2008  . B12 deficiency 08/16/2007  . Vitamin D deficiency 08/16/2007  . 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 SARCOIDOSIS, PULMONARY 05/09/2007  . Hypothyroidism 05/09/2007  . ALLERGIC RHINITIS 05/09/2007  . GERD 05/09/2007  . Personal history of colon polyps 05/09/2007    Wynelle Fanny, PTA 09/16/2016, 8:27 AM  [facility] [place], [place], [address] Phone: [phone]   Fax:  [phone]  Name: Elizabeth Gray MRN: [id number] Date of Birth: 1941-06-18

## 2016-09-19 ENCOUNTER — Ambulatory Visit: Payer: Medicare Other | Admitting: Physical Therapy

## 2016-09-19 ENCOUNTER — Encounter: Payer: Self-pay | Admitting: Physical Therapy

## 2016-09-19 DIAGNOSIS — M25672 Stiffness of left ankle, not elsewhere classified: Secondary | ICD-10-CM | POA: Diagnosis not present

## 2016-09-19 DIAGNOSIS — M25572 Pain in left ankle and joints of left foot: Secondary | ICD-10-CM

## 2016-09-19 NOTE — Therapy (Signed)
[facility] [place], [place], [address] Phone: [phone]   Fax:  [phone]  Physical Therapy Treatment  Patient Details  Name: Elizabeth Gray MRN: [id number] Date of Birth: [birth year] Referring Provider: Wylene Simmer MD.  Encounter Date: 09/19/2016      PT End of Session - 09/19/16 1312    Visit Number 5   Number of Visits 12   Date for PT Re-Evaluation 11/04/16   PT Start Time 1228   PT Stop Time 1327   PT Time Calculation (min) 59 min   Activity Tolerance Patient tolerated treatment well   Behavior During Therapy [facility] for tasks assessed/performed      Past Medical History:  Diagnosis Date  . Bursitis   . External hemorrhoids without mention of complication 6962   Colonoscopy   . Fatigue   . GERD (gastroesophageal reflux disease)   . Headache(784.0)    migraines  . Heat rash    under the breasts.Marland Kitchenappeared on monday.Marland KitchenMarland KitchenBurning & itching, uses cortisone  . Hypothyroidism   . Iron deficiency anemia, unspecified   . Lower esophageal ring 2008   EGD  . Nonorganic sleep disorder, unspecified   . Osteoarthritis   . Pancreatitis   . Personal history of colonic polyps   . Polio 1948  . Pulmonary sarcoidosis ([place])   . Sleep apnea    cpap since 09 [facility]  . Vitamin B12 deficiency   . Vitamin D deficiency     Past Surgical History:  Procedure Laterality Date  . APPENDECTOMY  1988  . CARPAL TUNNEL RELEASE  2002   rt  . CHOLECYSTECTOMY  2000  . COLONOSCOPY  12/19/2006   Multiple diminutive polyps destroyed-removed (hyperpastic), internal hemorrhoids  . ESOPHAGOGASTRODUODENOSCOPY  12/19/2006   lower esophageal ring dilated  . HAND SURGERY  1997   left  . KNEE ARTHROSCOPY Bilateral    multiple 2 on lft 1 on rt  . [place] SURGERY  1995  . TOE SURGERY Left    left foot next to little toe  joint rem  . TOTAL KNEE ARTHROPLASTY Right 05/09/2014   Procedure: RIGHT TOTAL KNEE  ARTHROPLASTY;  Surgeon: [name], MD;  Location: [place];  Service: Orthopedics;  Laterality: Right;    There were no vitals filed for this visit.      Subjective Assessment - 09/19/16 1230    Subjective Patient had no complaints after last treatment and doing better overall   Limitations Walking   How long can you walk comfortably? Short community distances.   Patient Stated Goals Get out of pain.   Currently in Pain? Yes   Pain Score 2    Pain Location Ankle   Pain Orientation Left   Pain Descriptors / Indicators Discomfort   Pain Type Chronic pain   Pain Onset More than a month ago   Pain Frequency Constant   Aggravating Factors  walking    Pain Relieving Factors at rest            [facility] PT Assessment - 09/19/16 0001      ROM / Strength   AROM / PROM / Strength AROM     AROM   AROM Assessment Site Ankle   Right/Left Ankle Left   Left Ankle Dorsiflexion 8                     [facility] Adult PT Treatment/Exercise - 09/19/16 0001  Acupuncturist Location L ankle   Water quality scientist Parameters 80-_0  x23mn   Electrical Stimulation Goals Pain     Vasopneumatic   Number Minutes Vasopneumatic  15 minutes   Vasopnuematic Location  Ankle   Vasopneumatic Pressure Medium     Ankle Exercises: Aerobic   Stationary Bike NuStep L5x10 min     Ankle Exercises: Standing   SLS L LE x5   Rocker Board 3 minutes   Other Standing Ankle Exercises 6" step ups L LE 2x10     Ankle Exercises: Seated   Other Seated Ankle Exercises L ankle isolator 1# DF/Inv/Ev x20 reps each   Other Seated Ankle Exercises prostretch DF x317m     Ankle Exercises: Supine   T-Band supine 4 way ankle with red t-band 2x10 each                  PT Short Term Goals - 09/05/16 1210      PT SHORT TERM GOAL #1   Title STG's=LTG's.           PT Long Term Goals - 09/19/16 1313      PT  LONG TERM GOAL #1   Title independent with a HEP.   Time 8   Period Weeks   Status On-going     PT LONG TERM GOAL #2   Title Increase left ankle dorsiflexion to 10 degrees to normalize the patient's gait pattern   Period Weeks   Status On-going  AROM 8 degrees DF     PT LONG TERM GOAL #3   Title Increase left ankle strength to 5/5 to increase stability for functional tasks   Time 8   Period Weeks   Status On-going     PT LONG TERM GOAL #4   Title Perform ADL's with pain not > 2-3/10.   Time 8   Period Weeks   Status On-going     PT LONG TERM GOAL #5   Title No episodes of left ankle instability.   Time 8   Period Weeks   Status Achieved               Plan - 09/19/16 1313    Clinical Impression Statement Patient progressing well with all activities and tolerated treatment well today. Patient has improved AROM to 8 degrees DF and has had no reported episodes of any instability. Patient progressing with ankle strengthening exercises and improving with increased SLS time on left LE. Patient met LTG#5 today and others ongoing due to strength and pain deficts.    Rehab Potential Excellent   PT Frequency 2x / week   PT Duration 6 weeks   PT Treatment/Interventions Cryotherapy;Electrical Stimulation;Moist Heat;Ultrasound;Patient/family education;Neuromuscular re-education;Therapeutic exercise;Therapeutic activities;Manual techniques;Passive range of motion   PT Next Visit Plan cont with POC and progress with strengthening / ankle stabilization    Consulted and Agree with Plan of Care Patient      Patient will benefit from skilled therapeutic intervention in order to improve the following deficits and impairments:  Pain, Decreased activity tolerance, Decreased strength, Decreased range of motion  Visit Diagnosis: Pain in left ankle and joints of left foot  Stiffness of left ankle, not elsewhere classified     Problem List Patient Active Problem List   Diagnosis  Date Noted  . Osteopenia 09/05/2016  . Upper respiratory infection 09/05/2016  . Chest pain 03/04/2016  . Degenerative arthritis of right knee 05/09/2014  .  Headache(784.0) 07/19/2013  . Tinnitus 07/19/2013  . Dry skin dermatitis 08/31/2012  . Left hip pain 08/31/2012  . Cerumen impaction 08/31/2012  . Well adult exam 05/25/2012  . Upper abdominal pain-chronic 04/02/2011  . Thumb pain 03/11/2011  . Donor, blood 03/11/2011  . Shoulder pain 03/11/2011  . OSTEOARTHRITIS 09/10/2010  . VERTIGO 03/05/2010  . OSA on CPAP 07/22/2008  . B12 deficiency 08/16/2007  . Vitamin D deficiency 08/16/2007  . 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 SARCOIDOSIS, PULMONARY 05/09/2007  . Hypothyroidism 05/09/2007  . ALLERGIC RHINITIS 05/09/2007  . GERD 05/09/2007  . Personal history of colon polyps 05/09/2007    Phillips Climes, PTA 09/19/2016, 1:28 PM  [facility] [address] [place], [place], [address] Phone: [phone]   Fax:  [phone]  Name: Elizabeth Gray MRN: [id number] Date of Birth: [birth year]

## 2016-09-21 ENCOUNTER — Other Ambulatory Visit: Payer: Self-pay | Admitting: Internal Medicine

## 2016-09-22 ENCOUNTER — Ambulatory Visit: Payer: Medicare Other | Admitting: Physical Therapy

## 2016-09-22 ENCOUNTER — Encounter: Payer: Self-pay | Admitting: Physical Therapy

## 2016-09-22 DIAGNOSIS — M25672 Stiffness of left ankle, not elsewhere classified: Secondary | ICD-10-CM

## 2016-09-22 DIAGNOSIS — M25572 Pain in left ankle and joints of left foot: Secondary | ICD-10-CM | POA: Diagnosis not present

## 2016-09-22 NOTE — Therapy (Signed)
[facility] [place], [place], [address] Phone: [phone]   Fax:  [phone]  Physical Therapy Treatment  Patient Details  Name: Elizabeth Gray MRN: [id number] Date of Birth: 23-Sep-1940 Referring Provider: Wylene Simmer MD.  Encounter Date: 09/22/2016      PT End of Session - 09/22/16 1652    Visit Number 6   Number of Visits 12   Date for PT Re-Evaluation 11/04/16   PT Start Time 1648   PT Stop Time 1744   PT Time Calculation (min) 56 min   Activity Tolerance Patient tolerated treatment well   Behavior During Therapy [facility] for tasks assessed/performed      Past Medical History:  Diagnosis Date  . Bursitis   . External hemorrhoids without mention of complication [id number]   Colonoscopy   . Fatigue   . GERD (gastroesophageal reflux disease)   . Headache(784.0)    migraines  . Heat rash    under the breasts.Marland Kitchenappeared on monday.Marland KitchenMarland KitchenBurning & itching, uses cortisone  . Hypothyroidism   . Iron deficiency anemia, unspecified   . Lower esophageal ring 2008   EGD  . Nonorganic sleep disorder, unspecified   . Osteoarthritis   . Pancreatitis   . Personal history of colonic polyps   . Polio 1948  . Pulmonary sarcoidosis ([place])   . Sleep apnea    cpap [facility] 09 [facility]  . Vitamin B12 deficiency   . Vitamin D deficiency     Past Surgical History:  Procedure Laterality Date  . APPENDECTOMY  1988  . CARPAL TUNNEL RELEASE  2002   rt  . CHOLECYSTECTOMY  2000  . COLONOSCOPY  12/19/2006   Multiple diminutive polyps destroyed-removed (hyperpastic), internal hemorrhoids  . ESOPHAGOGASTRODUODENOSCOPY  12/19/2006   lower esophageal ring dilated  . HAND SURGERY  1997   left  . KNEE ARTHROSCOPY Bilateral    multiple 2 on lft 1 on rt  . [place] SURGERY  1995  . TOE SURGERY Left    left foot next to little toe  joint rem  . TOTAL KNEE ARTHROPLASTY Right 05/09/2014   Procedure: RIGHT TOTAL KNEE  ARTHROPLASTY;  Surgeon: [name], MD;  Location: [place];  Service: Orthopedics;  Laterality: Right;    There were no vitals filed for this visit.      Subjective Assessment - 09/22/16 1649    Subjective Reports a little foot soreness today but reports she forgot her brace this morning.   Limitations Walking   How long can you walk comfortably? Short community distances.   Patient Stated Goals Get out of pain.   Currently in Pain? Yes   Pain Score 1    Pain Location Ankle   Pain Orientation Left   Pain Descriptors / Indicators Sore   Pain Type Chronic pain   Pain Onset More than a month ago            [facility] PT Assessment - 09/22/16 0001      Assessment   Medical Diagnosis Left ankle sprain.   Next MD Visit 10/07/2016     Restrictions   Weight Bearing Restrictions No                     [facility] Adult PT Treatment/Exercise - 09/22/16 0001      Knee/Hip Exercises: Aerobic   Nustep L5 x10 min     Knee/Hip Exercises: Supine   Straight Leg Raises AROM;Left;2 sets;10 reps  Straight Leg Raise with External Rotation AROM;Left;2 sets;10 reps     Knee/Hip Exercises: Sidelying   Hip ABduction AROM;Left;2 sets;10 reps     Modalities   Modalities Passenger transport manager Location L ankle   Electrical Stimulation Action Pre-Mod   Electrical Stimulation Parameters 80-150 hz x15 min   Electrical Stimulation Goals Edema;Pain     Vasopneumatic   Number Minutes Vasopneumatic  15 minutes   Vasopnuematic Location  Ankle   Vasopneumatic Pressure Medium   Vasopneumatic Temperature  60     Ankle Exercises: Standing   Rocker Board 3 minutes   Heel Raises 20 reps   Toe Raise 20 reps   Other Standing Ankle Exercises 6" step ups L LE 2x10   Other Standing Ankle Exercises L ankle dynadisc PF/DF, Inv/Ev, circles x20 reps     Ankle Exercises: Seated   ABC's 1 rep  with 1# ankle isolator   Other  Seated Ankle Exercises L ankle isolator 1# DF/Inv/Ev x20 reps each   Other Seated Ankle Exercises L ankle prostretch x20 rep with VCs for longer time on DF                  PT Short Term Goals - 09/05/16 1210      PT SHORT TERM GOAL #1   Title STG's=LTG's.           PT Long Term Goals - 09/22/16 1715      PT LONG TERM GOAL #1   Title independent with a HEP.   Time 8   Period Weeks   Status Achieved     PT LONG TERM GOAL #2   Title Increase left ankle dorsiflexion to 10 degrees to normalize the patient's gait pattern   Period Weeks   Status On-going  AROM 8 degrees DF     PT LONG TERM GOAL #3   Title Increase left ankle strength to 5/5 to increase stability for functional tasks   Time 8   Period Weeks   Status On-going     PT LONG TERM GOAL #4   Title Perform ADL's with pain not > 2-3/10.   Time 8   Period Weeks   Status Achieved     PT LONG TERM GOAL #5   Title No episodes of left ankle instability.   Time 8   Period Weeks   Status Achieved               Plan - 09/22/16 1757    Clinical Impression Statement Patient tolerated today's treatment well although reporting increased pain with heel raises today. Patient able to complete all other ankle and hip/knee strengthening exercises well with no complaint. Patient able to achieve LTG regarding ADL pain in clinic per patient report. Patient's greatest difficulty presents with prolonged walking per patient report. Increased edema present along L lateral malleolus today upon observation. Normal modalities response noted following removal of the modalities.   Rehab Potential Excellent   PT Frequency 2x / week   PT Duration 6 weeks   PT Treatment/Interventions Cryotherapy;Electrical Stimulation;Moist Heat;Ultrasound;Patient/family education;Neuromuscular re-education;Therapeutic exercise;Therapeutic activities;Manual techniques;Passive range of motion   PT Next Visit Plan cont with POC and progress  with strengthening / ankle stabilization    Consulted and Agree with Plan of Care Patient      Patient will benefit from skilled therapeutic intervention in order to improve the following deficits and impairments:  Pain, Decreased activity tolerance, Decreased strength,  Decreased range of motion  Visit Diagnosis: Pain in left ankle and joints of left foot  Stiffness of left ankle, not elsewhere classified     Problem List Patient Active Problem List   Diagnosis Date Noted  . Osteopenia 09/05/2016  . Upper respiratory infection 09/05/2016  . Chest pain 03/04/2016  . Degenerative arthritis of right knee 05/09/2014  . Headache(784.0) 07/19/2013  . Tinnitus 07/19/2013  . Dry skin dermatitis 08/31/2012  . Left hip pain 08/31/2012  . Cerumen impaction 08/31/2012  . Well adult exam 05/25/2012  . Upper abdominal pain-chronic 04/02/2011  . Thumb pain 03/11/2011  . Donor, blood 03/11/2011  . Shoulder pain 03/11/2011  . OSTEOARTHRITIS 09/10/2010  . VERTIGO 03/05/2010  . OSA on CPAP 07/22/2008  . B12 deficiency 08/16/2007  . Vitamin D deficiency 08/16/2007  . 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 SARCOIDOSIS, PULMONARY 05/09/2007  . Hypothyroidism 05/09/2007  . ALLERGIC RHINITIS 05/09/2007  . GERD 05/09/2007  . Personal history of colon polyps 05/09/2007    Wynelle Fanny, PTA 09/22/2016, 6:01 PM  [facility] [address] [place], [place], [address] Phone: [phone]   Fax:  [phone]  Name: Elizabeth Gray MRN: [id number] Date of Birth: [birth year]

## 2016-09-23 DIAGNOSIS — Z1231 Encounter for screening mammogram for malignant neoplasm of breast: Secondary | ICD-10-CM | POA: Diagnosis not present

## 2016-09-23 DIAGNOSIS — M8589 Other specified disorders of bone density and structure, multiple sites: Secondary | ICD-10-CM | POA: Diagnosis not present

## 2016-09-23 DIAGNOSIS — Z803 Family history of malignant neoplasm of breast: Secondary | ICD-10-CM | POA: Diagnosis not present

## 2016-09-23 DIAGNOSIS — M810 Age-related osteoporosis without current pathological fracture: Secondary | ICD-10-CM | POA: Diagnosis not present

## 2016-09-23 LAB — HM DEXA SCAN: HM Dexa Scan: -2.5

## 2016-09-23 LAB — HM MAMMOGRAPHY

## 2016-09-26 ENCOUNTER — Ambulatory Visit: Payer: Medicare Other | Admitting: Physical Therapy

## 2016-09-26 ENCOUNTER — Encounter: Payer: Self-pay | Admitting: Physical Therapy

## 2016-09-26 DIAGNOSIS — Z1389 Encounter for screening for other disorder: Secondary | ICD-10-CM | POA: Diagnosis not present

## 2016-09-26 DIAGNOSIS — M25572 Pain in left ankle and joints of left foot: Secondary | ICD-10-CM

## 2016-09-26 DIAGNOSIS — M25672 Stiffness of left ankle, not elsewhere classified: Secondary | ICD-10-CM | POA: Diagnosis not present

## 2016-09-26 DIAGNOSIS — Z01419 Encounter for gynecological examination (general) (routine) without abnormal findings: Secondary | ICD-10-CM | POA: Diagnosis not present

## 2016-09-26 DIAGNOSIS — Z124 Encounter for screening for malignant neoplasm of cervix: Secondary | ICD-10-CM | POA: Diagnosis not present

## 2016-09-26 DIAGNOSIS — Z130 Encounter for screening for diseases of the blood and blood-forming organs and certain disorders involving the immune mechanism: Secondary | ICD-10-CM | POA: Diagnosis not present

## 2016-09-26 NOTE — Therapy (Signed)
[facility] [place], [place], [address] Phone: [phone]   Fax:  [phone]  Physical Therapy Treatment  Patient Details  Name: Elizabeth Gray MRN: [id number] Date of Birth: 1941/06/16 Referring Provider: Wylene Simmer MD.  Encounter Date: 09/26/2016      PT End of Session - 09/26/16 1436    Visit Number 7   Number of Visits 12   Date for PT Re-Evaluation 11/04/16   PT Start Time [id number]   PT Stop Time 1524   PT Time Calculation (min) 52 min   Activity Tolerance Patient tolerated treatment well   Behavior During Therapy [facility] for tasks assessed/performed      Past Medical History:  Diagnosis Date  . Bursitis   . External hemorrhoids without mention of complication [id number]   Colonoscopy   . Fatigue   . GERD (gastroesophageal reflux disease)   . Headache(784.0)    migraines  . Heat rash    under the breasts.Marland Kitchenappeared on monday.Marland KitchenMarland KitchenBurning & itching, uses cortisone  . Hypothyroidism   . Iron deficiency anemia, unspecified   . Lower esophageal ring 2008   EGD  . Nonorganic sleep disorder, unspecified   . Osteoarthritis   . Pancreatitis   . Personal history of colonic polyps   . Polio 1948  . Pulmonary sarcoidosis ([place])   . Sleep apnea    cpap since 09 [facility]  . Vitamin B12 deficiency   . Vitamin D deficiency     Past Surgical History:  Procedure Laterality Date  . APPENDECTOMY  1988  . CARPAL TUNNEL RELEASE  2002   rt  . CHOLECYSTECTOMY  2000  . COLONOSCOPY  12/19/2006   Multiple diminutive polyps destroyed-removed (hyperpastic), internal hemorrhoids  . ESOPHAGOGASTRODUODENOSCOPY  12/19/2006   lower esophageal ring dilated  . HAND SURGERY  1997   left  . KNEE ARTHROSCOPY Bilateral    multiple 2 on lft 1 on rt  . [place] SURGERY  1995  . TOE SURGERY Left    left foot next to little toe  joint rem  . TOTAL KNEE ARTHROPLASTY Right 05/09/2014   Procedure: RIGHT TOTAL KNEE  ARTHROPLASTY;  Surgeon: [name], MD;  Location: [place];  Service: Orthopedics;  Laterality: Right;    There were no vitals filed for this visit.      Subjective Assessment - 09/26/16 1434    Subjective Reports that she did not wear her stabilizer brace from Thursday to Sunday and states that she can "tell it." Reports that she has done a lot of running errands today and has soreness going into lateral L calf.   Limitations Walking   How long can you walk comfortably? Short community distances.   Patient Stated Goals Get out of pain.   Currently in Pain? Yes   Pain Score 2    Pain Location Ankle   Pain Orientation Left   Pain Descriptors / Indicators Sore   Pain Type Chronic pain   Pain Radiating Towards anteriolateral L calf   Pain Onset More than a month ago            [facility] PT Assessment - 09/26/16 0001      Assessment   Medical Diagnosis Left ankle sprain.   Next MD Visit 10/07/2016     Restrictions   Weight Bearing Restrictions No                     [facility] Adult PT  Treatment/Exercise - 09/26/16 0001      Knee/Hip Exercises: Aerobic   Nustep L5 x10 min     Modalities   Modalities Electrical Stimulation;Vasopneumatic     Electrical Stimulation   Electrical Stimulation Location L lateral forefoot and lateral ankle   Electrical Stimulation Action Pre-Mod   Electrical Stimulation Parameters 80-150 hz x15 min   Electrical Stimulation Goals Edema;Pain     Vasopneumatic   Number Minutes Vasopneumatic  15 minutes   Vasopnuematic Location  Ankle   Vasopneumatic Pressure Medium   Vasopneumatic Temperature  34     Ankle Exercises: Standing   Rocker Board 4 minutes   Heel Raises 20 reps   Toe Raise 20 reps   Other Standing Ankle Exercises 6" step ups L LE 2x10   Other Standing Ankle Exercises L ankle dynadisc PF/DF, Inv/Ev, circles x20 reps     Ankle Exercises: Seated   ABC's 1 rep  1# ankle isolator   BAPS Sitting;Level 2;15 reps  Df/Pf,  circles   Other Seated Ankle Exercises L ankle isolator 1# DF/Inv/Ev x20 reps each                  PT Short Term Goals - 09/05/16 1210      PT SHORT TERM GOAL #1   Title STG's=LTG's.           PT Long Term Goals - 09/22/16 1715      PT LONG TERM GOAL #1   Title independent with a HEP.   Time 8   Period Weeks   Status Achieved     PT LONG TERM GOAL #2   Title Increase left ankle dorsiflexion to 10 degrees to normalize the patient's gait pattern   Period Weeks   Status On-going  AROM 8 degrees DF     PT LONG TERM GOAL #3   Title Increase left ankle strength to 5/5 to increase stability for functional tasks   Time 8   Period Weeks   Status On-going     PT LONG TERM GOAL #4   Title Perform ADL's with pain not > 2-3/10.   Time 8   Period Weeks   Status Achieved     PT LONG TERM GOAL #5   Title No episodes of left ankle instability.   Time 8   Period Weeks   Status Achieved               Plan - 09/26/16 1510    Clinical Impression Statement Patient tolerated today's treatment well with no reports of any increased L ankle soreness with any of the exercises completed today. Patient was introduced to L ankle control exercises with BAPS board with patient able to complete fairly well. Patient experienced more pain along the lateral L foot and ankle today thus electrical stimulation electrode placement at the areas of greatest discomfort. Normal modalities response noted following removal of the modalities.   Rehab Potential Excellent   PT Frequency 2x / week   PT Duration 6 weeks   PT Treatment/Interventions Cryotherapy;Electrical Stimulation;Moist Heat;Ultrasound;Patient/family education;Neuromuscular re-education;Therapeutic exercise;Therapeutic activities;Manual techniques;Passive range of motion   PT Next Visit Plan cont with POC and progress with strengthening / ankle stabilization    Consulted and Agree with Plan of Care Patient      Patient  will benefit from skilled therapeutic intervention in order to improve the following deficits and impairments:  Pain, Decreased activity tolerance, Decreased strength, Decreased range of motion  Visit Diagnosis: Pain in left  ankle and joints of left foot  Stiffness of left ankle, not elsewhere classified     Problem List Patient Active Problem List   Diagnosis Date Noted  . Osteopenia 09/05/2016  . Upper respiratory infection 09/05/2016  . Chest pain 03/04/2016  . Degenerative arthritis of right knee 05/09/2014  . Headache(784.0) 07/19/2013  . Tinnitus 07/19/2013  . Dry skin dermatitis 08/31/2012  . Left hip pain 08/31/2012  . Cerumen impaction 08/31/2012  . Well adult exam 05/25/2012  . Upper abdominal pain-chronic 04/02/2011  . Thumb pain 03/11/2011  . Donor, blood 03/11/2011  . Shoulder pain 03/11/2011  . OSTEOARTHRITIS 09/10/2010  . VERTIGO 03/05/2010  . OSA on CPAP 07/22/2008  . B12 deficiency 08/16/2007  . Vitamin D deficiency 08/16/2007  . 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 SARCOIDOSIS, PULMONARY 05/09/2007  . Hypothyroidism 05/09/2007  . ALLERGIC RHINITIS 05/09/2007  . GERD 05/09/2007  . Personal history of colon polyps 05/09/2007    Wynelle Fanny, PTA 09/26/2016, 3:27 PM  [facility] [address] [place], [place], [address] Phone: [phone]   Fax:  [phone]  Name: Elizabeth Gray MRN: [id number] Date of Birth: January 18, 1941

## 2016-10-03 ENCOUNTER — Encounter: Payer: Self-pay | Admitting: Physical Therapy

## 2016-10-03 ENCOUNTER — Ambulatory Visit: Payer: Medicare Other | Attending: Student | Admitting: Physical Therapy

## 2016-10-03 ENCOUNTER — Other Ambulatory Visit: Payer: Self-pay | Admitting: Internal Medicine

## 2016-10-03 DIAGNOSIS — M25672 Stiffness of left ankle, not elsewhere classified: Secondary | ICD-10-CM | POA: Diagnosis not present

## 2016-10-03 DIAGNOSIS — E559 Vitamin D deficiency, unspecified: Secondary | ICD-10-CM

## 2016-10-03 DIAGNOSIS — Z0000 Encounter for general adult medical examination without abnormal findings: Secondary | ICD-10-CM

## 2016-10-03 DIAGNOSIS — E034 Atrophy of thyroid (acquired): Secondary | ICD-10-CM

## 2016-10-03 DIAGNOSIS — M25572 Pain in left ankle and joints of left foot: Secondary | ICD-10-CM

## 2016-10-03 NOTE — Therapy (Signed)
[facility] [place], [place], [address] Phone: [phone]   Fax:  [phone]  Physical Therapy Treatment  Patient Details  Name: Elizabeth Gray MRN: [id number] Date of Birth: February 28, 1941 Referring Provider: Wylene Simmer MD.  Encounter Date: 10/03/2016      PT End of Session - 10/03/16 1508    Visit Number 8   Number of Visits 12   Date for PT Re-Evaluation 11/04/16   PT Start Time [id number]   PT Stop Time 1529   PT Time Calculation (min) 44 min   Activity Tolerance Patient tolerated treatment well   Behavior During Therapy [facility] for tasks assessed/performed      Past Medical History:  Diagnosis Date  . Bursitis   . External hemorrhoids without mention of complication [id number]   Colonoscopy   . Fatigue   . GERD (gastroesophageal reflux disease)   . Headache(784.0)    migraines  . Heat rash    under the breasts.Marland Kitchenappeared on monday.Marland KitchenMarland KitchenBurning & itching, uses cortisone  . Hypothyroidism   . Iron deficiency anemia, unspecified   . Lower esophageal ring 2008   EGD  . Nonorganic sleep disorder, unspecified   . Osteoarthritis   . Pancreatitis   . Personal history of colonic polyps   . Polio 1948  . Pulmonary sarcoidosis ([place])   . Sleep apnea    cpap since 09 [facility]  . Vitamin B12 deficiency   . Vitamin D deficiency     Past Surgical History:  Procedure Laterality Date  . APPENDECTOMY  1988  . CARPAL TUNNEL RELEASE  2002   rt  . CHOLECYSTECTOMY  2000  . COLONOSCOPY  12/19/2006   Multiple diminutive polyps destroyed-removed (hyperpastic), internal hemorrhoids  . ESOPHAGOGASTRODUODENOSCOPY  12/19/2006   lower esophageal ring dilated  . HAND SURGERY  1997   left  . KNEE ARTHROSCOPY Bilateral    multiple 2 on lft 1 on rt  . [place] SURGERY  1995  . TOE SURGERY Left    left foot next to little toe  joint rem  . TOTAL KNEE ARTHROPLASTY Right 05/09/2014   Procedure: RIGHT TOTAL KNEE  ARTHROPLASTY;  Surgeon: [name], MD;  Location: [place];  Service: Orthopedics;  Laterality: Right;    There were no vitals filed for this visit.      Subjective Assessment - 10/03/16 1449    Subjective Patient reported increased swelling and pain after a lot of walking and standing at a wedding without brace   Limitations Walking   How long can you walk comfortably? Short community distances.   Patient Stated Goals Get out of pain.   Currently in Pain? Yes   Pain Score 4    Pain Location Ankle   Pain Orientation Left   Pain Descriptors / Indicators Sore   Pain Type Chronic pain   Pain Onset More than a month ago   Pain Frequency Intermittent   Aggravating Factors  prolong standing or walking   Pain Relieving Factors at rest                         [facility] Adult PT Treatment/Exercise - 10/03/16 0001      Knee/Hip Exercises: Aerobic   Nustep L5 x10 min, UE/LE     Electrical Stimulation   Electrical Stimulation Location L lateral forefoot and lateral ankle   Electrical Stimulation Action premod   Electrical Stimulation Parameters 1-10hz  x58min  Electrical Stimulation Goals Edema;Pain     Vasopneumatic   Number Minutes Vasopneumatic  15 minutes   Vasopnuematic Location  Ankle   Vasopneumatic Pressure Medium     Ankle Exercises: Seated   BAPS Sitting;Level 2  2x20   Other Seated Ankle Exercises L ankle isolator 1# DF/Inv/Ev x20 reps each   Other Seated Ankle Exercises L ankle prostretch x 39min     Ankle Exercises: Supine   T-Band supine 4 way ankle with red t-band 2x10 each                  PT Short Term Goals - 09/05/16 1210      PT SHORT TERM GOAL #1   Title STG's=LTG's.           PT Long Term Goals - 09/22/16 1715      PT LONG TERM GOAL #1   Title independent with a HEP.   Time 8   Period Weeks   Status Achieved     PT LONG TERM GOAL #2   Title Increase left ankle dorsiflexion to 10 degrees to normalize the patient's  gait pattern   Period Weeks   Status On-going  AROM 8 degrees DF     PT LONG TERM GOAL #3   Title Increase left ankle strength to 5/5 to increase stability for functional tasks   Time 8   Period Weeks   Status On-going     PT LONG TERM GOAL #4   Title Perform ADL's with pain not > 2-3/10.   Time 8   Period Weeks   Status Achieved     PT LONG TERM GOAL #5   Title No episodes of left ankle instability.   Time 8   Period Weeks   Status Achieved               Plan - 10/03/16 1519    Clinical Impression Statement Patient tolerated treatment well today. Patient had increased pain and swelling from prolong standing and walking. Today focused on seated activities. Patient may be able to tolerate standing exercises next treatment. Patient had some soreness with eversion exercises. Patient current goals progressing yet ongoing due to pain, ROM and strength deficts.    Rehab Potential Excellent   PT Frequency 2x / week   PT Duration 6 weeks   PT Treatment/Interventions Cryotherapy;Electrical Stimulation;Moist Heat;Ultrasound;Patient/family education;Neuromuscular re-education;Therapeutic exercise;Therapeutic activities;Manual techniques;Passive range of motion   PT Next Visit Plan cont with POC and progress with strengthening / ankle stabilization (MD. Doran Durand appt 10/07/16) send MD note next vist   Consulted and Agree with Plan of Care Patient      Patient will benefit from skilled therapeutic intervention in order to improve the following deficits and impairments:  Pain, Decreased activity tolerance, Decreased strength, Decreased range of motion  Visit Diagnosis: Pain in left ankle and joints of left foot  Stiffness of left ankle, not elsewhere classified     Problem List Patient Active Problem List   Diagnosis Date Noted  . Osteopenia 09/05/2016  . Upper respiratory infection 09/05/2016  . Chest pain 03/04/2016  . Degenerative arthritis of right knee 05/09/2014  .  Headache(784.0) 07/19/2013  . Tinnitus 07/19/2013  . Dry skin dermatitis 08/31/2012  . Left hip pain 08/31/2012  . Cerumen impaction 08/31/2012  . Well adult exam 05/25/2012  . Upper abdominal pain-chronic 04/02/2011  . Thumb pain 03/11/2011  . Donor, blood 03/11/2011  . Shoulder pain 03/11/2011  . OSTEOARTHRITIS 09/10/2010  . VERTIGO  03/05/2010  . OSA on CPAP 07/22/2008  . B12 deficiency 08/16/2007  . Vitamin D deficiency 08/16/2007  . 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 SARCOIDOSIS, PULMONARY 05/09/2007  . Hypothyroidism 05/09/2007  . ALLERGIC RHINITIS 05/09/2007  . GERD 05/09/2007  . Personal history of colon polyps 05/09/2007    Phillips Climes, PTA 10/03/2016, 3:29 PM  [facility] [place], [place], [address] Phone: [phone]   Fax:  [phone]  Name: Elizabeth Gray MRN: [id number] Date of Birth: [birth year]

## 2016-10-04 ENCOUNTER — Encounter: Payer: Self-pay | Admitting: Internal Medicine

## 2016-10-04 NOTE — Progress Notes (Unsigned)
Results entered and sent to scan  

## 2016-10-05 ENCOUNTER — Encounter: Payer: Self-pay | Admitting: Internal Medicine

## 2016-10-05 NOTE — Progress Notes (Unsigned)
Results entered and sent to scan  

## 2016-10-05 NOTE — Telephone Encounter (Signed)
Called refill into [facility] had to leave on pharmacy vm../lmb 

## 2016-10-06 ENCOUNTER — Ambulatory Visit: Payer: Medicare Other | Admitting: *Deleted

## 2016-10-06 DIAGNOSIS — M25572 Pain in left ankle and joints of left foot: Secondary | ICD-10-CM | POA: Diagnosis not present

## 2016-10-06 DIAGNOSIS — M25672 Stiffness of left ankle, not elsewhere classified: Secondary | ICD-10-CM

## 2016-10-06 NOTE — Therapy (Signed)
[facility] [place], [place], [address] Phone: [phone]   Fax:  [phone]  Physical Therapy Treatment  Patient Details  Name: Elizabeth Gray MRN: [id number] Date of Birth: 10-21-1940 Referring Provider: Wylene Simmer MD.  Encounter Date: 10/06/2016      PT End of Session - 10/06/16 1605    Visit Number 9   Number of Visits 12   Date for PT Re-Evaluation 11/04/16   PT Start Time 0349   PT Stop Time 1655   PT Time Calculation (min) 50 min      Past Medical History:  Diagnosis Date  . Bursitis   . External hemorrhoids without mention of complication 1791   Colonoscopy   . Fatigue   . GERD (gastroesophageal reflux disease)   . Headache(784.0)    migraines  . Heat rash    under the breasts.Marland Kitchenappeared on monday.Marland KitchenMarland KitchenBurning & itching, uses cortisone  . Hypothyroidism   . Iron deficiency anemia, unspecified   . Lower esophageal ring 2008   EGD  . Nonorganic sleep disorder, unspecified   . Osteoarthritis   . Pancreatitis   . Personal history of colonic polyps   . Polio 1948  . Pulmonary sarcoidosis ([place])   . Sleep apnea    cpap since 09 [facility]  . Vitamin B12 deficiency   . Vitamin D deficiency     Past Surgical History:  Procedure Laterality Date  . APPENDECTOMY  1988  . CARPAL TUNNEL RELEASE  2002   rt  . CHOLECYSTECTOMY  2000  . COLONOSCOPY  12/19/2006   Multiple diminutive polyps destroyed-removed (hyperpastic), internal hemorrhoids  . ESOPHAGOGASTRODUODENOSCOPY  12/19/2006   lower esophageal ring dilated  . HAND SURGERY  1997   left  . KNEE ARTHROSCOPY Bilateral    multiple 2 on lft 1 on rt  . [place] SURGERY  1995  . TOE SURGERY Left    left foot next to little toe  joint rem  . TOTAL KNEE ARTHROPLASTY Right 05/09/2014   Procedure: RIGHT TOTAL KNEE ARTHROPLASTY;  Surgeon: [name], MD;  Location: [facility];  Service: Orthopedics;  Laterality: Right;    There were  no vitals filed for this visit.      Subjective Assessment - 10/06/16 1603    Subjective Patient reported increased swelling and pain after a lot of walking and standing at a wedding without brace. To MD tomorrow.    Limitations Walking   How long can you walk comfortably? Short community distances.   Patient Stated Goals Get out of pain.   Currently in Pain? Yes   Pain Score 4    Pain Location Ankle   Pain Orientation Left   Pain Descriptors / Indicators Sore   Pain Type Chronic pain   Pain Onset More than a month ago   Pain Frequency Intermittent                         [facility] Adult PT Treatment/Exercise - 10/06/16 0001      Knee/Hip Exercises: Aerobic   Nustep L5 x10 min, UE/LE     Modalities   Modalities Electrical Stimulation;Vasopneumatic     Electrical Stimulation   Electrical Stimulation Location L lateral forefoot and lateral ankle  premod 1-10hz  x 15 mins   Electrical Stimulation Goals Edema;Pain     Vasopneumatic   Number Minutes Vasopneumatic  15 minutes   Vasopnuematic Location  Ankle   Vasopneumatic  Pressure Medium   Vasopneumatic Temperature  34     Manual Therapy   Manual Therapy Soft tissue mobilization   Soft tissue mobilization STW along distal LT malleolus area       Ankle Exercises: Standing   Rocker Board 4 minutes   Heel Raises 20 reps   Toe Raise 20 reps   Other Standing Ankle Exercises L ankle dynadisc PF/DF, Inv/Ev, circles x20 reps     Ankle Exercises: Seated   Other Seated Ankle Exercises L ankle isolator 1# DF/Inv/Ev x20 reps each                  PT Short Term Goals - 09/05/16 1210      PT SHORT TERM GOAL #1   Title STG's=LTG's.           PT Long Term Goals - 09/22/16 1715      PT LONG TERM GOAL #1   Title independent with a HEP.   Time 8   Period Weeks   Status Achieved     PT LONG TERM GOAL #2   Title Increase left ankle dorsiflexion to 10 degrees to normalize the patient's gait pattern    Period Weeks   Status On-going  AROM 8 degrees DF     PT LONG TERM GOAL #3   Title Increase left ankle strength to 5/5 to increase stability for functional tasks   Time 8   Period Weeks   Status On-going     PT LONG TERM GOAL #4   Title Perform ADL's with pain not > 2-3/10.   Time 8   Period Weeks   Status Achieved     PT LONG TERM GOAL #5   Title No episodes of left ankle instability.   Time 8   Period Weeks   Status Achieved               Plan - 10/06/16 1632    Clinical Impression Statement Pt arrived with increased soreness still in LT ankle due to a lot of walking and activity without brace on. She was about 20% better before this and about 10% better now than before starting PT. Her ROM was still good at DF 12 degrees and PF at 45 degrees. Pt was still tender and had mild swelling  along distal  malleolus .     Rehab Potential Excellent   PT Frequency 2x / week   PT Duration 6 weeks   PT Next Visit Plan cont with POC and progress with strengthening / ankle stabilization (MD. Doran Durand appt 10/07/16) send MD note next vist   Consulted and Agree with Plan of Care Patient      Patient will benefit from skilled therapeutic intervention in order to improve the following deficits and impairments:  Pain, Decreased activity tolerance, Decreased strength, Decreased range of motion  Visit Diagnosis: Pain in left ankle and joints of left foot  Stiffness of left ankle, not elsewhere classified     Problem List Patient Active Problem List   Diagnosis Date Noted  . Osteopenia 09/05/2016  . Upper respiratory infection 09/05/2016  . Chest pain 03/04/2016  . Degenerative arthritis of right knee 05/09/2014  . Headache(784.0) 07/19/2013  . Tinnitus 07/19/2013  . Dry skin dermatitis 08/31/2012  . Left hip pain 08/31/2012  . Cerumen impaction 08/31/2012  . Well adult exam 05/25/2012  . Upper abdominal pain-chronic 04/02/2011  . Thumb pain 03/11/2011  . Donor, blood  03/11/2011  . Shoulder pain 03/11/2011  .  OSTEOARTHRITIS 09/10/2010  . VERTIGO 03/05/2010  . OSA on CPAP 07/22/2008  . B12 deficiency 08/16/2007  . Vitamin D deficiency 08/16/2007  . 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 SARCOIDOSIS, PULMONARY 05/09/2007  . Hypothyroidism 05/09/2007  . ALLERGIC RHINITIS 05/09/2007  . GERD 05/09/2007  . Personal history of colon polyps 05/09/2007    RAMSEUR,CHRIS, PTA 10/06/2016, 5:44 PM Mali Applegate MPT  [facility] [address] [place], [place], [address] Phone: [phone]   Fax:  [phone]  Name: Elizabeth Gray MRN: [id number] Date of Birth: 09-25-1940

## 2016-10-07 ENCOUNTER — Encounter: Payer: Self-pay | Admitting: Physician Assistant

## 2016-10-07 ENCOUNTER — Ambulatory Visit (INDEPENDENT_AMBULATORY_CARE_PROVIDER_SITE_OTHER): Payer: Medicare Other | Admitting: Physician Assistant

## 2016-10-07 VITALS — BP 122/68 | HR 65 | Ht 63.5 in | Wt 168.2 lb

## 2016-10-07 DIAGNOSIS — R1013 Epigastric pain: Secondary | ICD-10-CM | POA: Diagnosis not present

## 2016-10-07 DIAGNOSIS — R1314 Dysphagia, pharyngoesophageal phase: Secondary | ICD-10-CM

## 2016-10-07 DIAGNOSIS — S93492D Sprain of other ligament of left ankle, subsequent encounter: Secondary | ICD-10-CM | POA: Diagnosis not present

## 2016-10-07 MED ORDER — DEXLANSOPRAZOLE 60 MG PO CPDR: 60.0000 mg | DELAYED_RELEASE_CAPSULE | Freq: Every day | ORAL | 3 refills | Status: DC

## 2016-10-07 MED ORDER — PANCRELIPASE (LIP-PROT-AMYL) 24000-76000 UNITS PO CPEP: ORAL_CAPSULE | ORAL | 11 refills | Status: DC

## 2016-10-07 NOTE — Progress Notes (Addendum)
Chief Complaint: Dysphagia  HPI:  Elizabeth Gray is a 76 year old Caucasian female with a past medical history of GERD, lower esophageal ring, pancreatitis, colon polyps and many others listed below, who was referred to me by Plotnikov, Evie Lacks, MD for a complaint of dysphagia .     Patient followed with Dr. Carlean Purl for her last screening colonoscopy 09/17/12 with a finding of polyps, patient was recommended repeat in 5 years. Patient also had her last EGD 04/20/11 with a finding of a stricture which was dilated to 18 mm. Patient was last seen in clinic 06/20/14 for follow-up of her chronic abdominal pain that seemed to respond to pancreatic insufficiency.   Today, the patient tells me that over the past few months she has noted that food and sometimes liquids are getting stuck in her throat on the way down. She tells me that for example she just ate lunch and feels as though only half of her meal has made it into her stomach. Typically her food will gradually go down but sometimes it will come back up. Patient also notes some reflux symptoms telling me that in the morning she can reflux her orange juice when brushing her teeth. Patient has been on a variety of reflux medications in the past including Prilosec, Nexium, pantoprazole and others which have all stopped working for her. Currently she is on Zegerid 40 mg once a day which was working until recently. Patient tells me that currently she is not really trying to do anything different for her dysphagia, but would like to "get this fixed before it gets worse".   Patient also asked me for refills of her Creon today.   Patient denies fever, chills, blood in her stool, melena, weight loss, nausea, vomiting, change in bowel habits, or abdominal pain  Past Medical History:  Diagnosis Date  . Bursitis   . External hemorrhoids without mention of complication 7616   Colonoscopy   . Fatigue   . GERD (gastroesophageal reflux disease)   . Headache(784.0)      migraines  . Heat rash    under the breasts.Marland Kitchenappeared on monday.Marland KitchenMarland KitchenBurning & itching, uses cortisone  . Hypothyroidism   . Iron deficiency anemia, unspecified   . Lower esophageal ring 2008   EGD  . Nonorganic sleep disorder, unspecified   . Osteoarthritis   . Pancreatitis   . Personal history of colonic polyps   . Polio 1948  . Pulmonary sarcoidosis ([place])   . Sleep apnea    cpap since 09 [facility]  . Vitamin B12 deficiency   . Vitamin D deficiency     Past Surgical History:  Procedure Laterality Date  . APPENDECTOMY  1988  . CARPAL TUNNEL RELEASE  2002   rt  . CHOLECYSTECTOMY  2000  . COLONOSCOPY  12/19/2006   Multiple diminutive polyps destroyed-removed (hyperpastic), internal hemorrhoids  . ESOPHAGOGASTRODUODENOSCOPY  12/19/2006   lower esophageal ring dilated  . HAND SURGERY  1997   left  . KNEE ARTHROSCOPY Bilateral    multiple 2 on lft 1 on rt  . [place] SURGERY  1995  . TOE SURGERY Left    left foot next to little toe  joint rem  . TOTAL KNEE ARTHROPLASTY Right 05/09/2014   Procedure: RIGHT TOTAL KNEE ARTHROPLASTY;  Surgeon: [name], MD;  Location: [facility];  Service: Orthopedics;  Laterality: Right;    Current Outpatient Prescriptions  Medication Sig Dispense Refill  . Alum Hydroxide-Mag Carbonate (GAVISCON PO)  Take 1 tablet by mouth as needed (reflux). As directed as needed    . aspirin 81 MG tablet Take 81 mg by mouth daily.    Marland Kitchen azithromycin (ZITHROMAX Z-PAK) 250 MG tablet As directed 6 each 0  . Cholecalciferol (VITAMIN D PO) Take 1,000 Units by mouth daily.     Marland Kitchen CREON 24000 UNITS CPEP TAKE 1 CAPSULE (24,000 UNITS TOTAL) BY MOUTH 3 (THREE) TIMES DAILY. 270 capsule 2  . cyanocobalamin (,VITAMIN B-12,) 1000 MCG/ML injection Inject 1 mL (1,000 mcg total) into the muscle every 14 (fourteen) days. 10 mL 2  . Flaxseed, Linseed, (FLAXSEED OIL) OIL Take 1 capsule by mouth 2 (two) times daily.      Marland Kitchen GLUCOSAMINE-CHONDROITIN-MSM-D3 PO  Take 1 tablet by mouth 2 (two) times daily.    Marland Kitchen levothyroxine (SYNTHROID, LEVOTHROID) 100 MCG tablet TAKE 1 TABLET EVERY DAY 90 tablet 1  . magnesium oxide (MAG-OX) 400 MG tablet Take 400 mg by mouth at bedtime.    . Multiple Vitamins-Minerals (ICAPS AREDS FORMULA PO) Take 1 capsule by mouth daily.      Marland Kitchen omeprazole-sodium bicarbonate (ZEGERID) 40-1100 MG capsule TAKE 1 CAPSULE BY MOUTH DAILY BEFORE BREAKFAST. 90 capsule 0  . OVER THE COUNTER MEDICATION Take 5 tablets by mouth 2 (two) times daily. Supplement - [place]    . Polyethyl Glycol-Propyl Glycol (SYSTANE ULTRA) 0.4-0.3 % SOLN Apply 1 drop to eye as needed (dry eyes).     . topiramate (TOPAMAX) 100 MG tablet Take 1 tablet (100 mg total) by mouth at bedtime. 90 tablet 4  . traMADol (ULTRAM) 50 MG tablet Take 100 mg by mouth at bedtime.     . vitamin C (ASCORBIC ACID) 500 MG tablet Take 500 mg by mouth daily.      Marland Kitchen zolpidem (AMBIEN) 5 MG tablet TAKE ONE TABLET BY MOUTH AT BEDTIME 30 tablet 1  . beclomethasone (QVAR) 40 MCG/ACT inhaler Inhale 2 puffs into the lungs 2 (two) times daily. 1 Inhaler 0   No current facility-administered medications for this visit.     Allergies as of 10/07/2016 - Review Complete 10/03/2016  Allergen Reaction Noted  . Metoclopramide hcl Other (See Comments)   . Oxymetazoline hcl Itching   . Sucralfate Other (See Comments)     Family History  Problem Relation Age of Onset  . Brain cancer Father     brain tumor  . Migraines Mother   . Breast cancer Paternal Aunt   . Colon cancer Neg Hx     Social History   Social History  . Marital status: Married    Spouse name: Elizabeth Gray  . Number of children: 0  . Years of education: college   Occupational History  . Statistician     works three days per week   . LEGAL ASSISTANT 3d/wk [employer]   Social History Main Topics  . Smoking status: Former Smoker    Packs/day: 1.00    Years: 17.00    Types: Cigarettes    Quit date:  08/01/1990  . Smokeless tobacco: Never Used  . Alcohol use No  . Drug use: No  . Sexual activity: Not on file   Other Topics Concern  . Not on file   Social History Narrative   Patient lives at home with her spouse.   Caffeine Use: none    Review of Systems:    Constitutional: No weight loss, fever or chills Cardiovascular: No chest pain Respiratory: No SOB  Gastrointestinal: See HPI and otherwise negative   Physical Exam:  Vital signs: BP 122/68   Pulse 65   Ht 5' 3.5" (1.613 m)   Wt 168 lb 3.2 oz (76.3 kg)   SpO2 98%   BMI 29.33 kg/m   Constitutional:   Pleasant overweight Caucasian female appears to be in NAD, Well developed, Well nourished, alert and cooperative Head:  Normocephalic and atraumatic. Eyes:   PEERL, EOMI. No icterus. Conjunctiva pink. Ears:  Normal auditory acuity. Neck:  Supple Throat: Oral cavity and pharynx without inflammation, swelling or lesion.  Respiratory: Respirations even and unlabored. Lungs clear to auscultation bilaterally.   No wheezes, crackles, or rhonchi.  Cardiovascular: Normal S1, S2. No MRG. Regular rate and rhythm. No peripheral edema, cyanosis or pallor.  Gastrointestinal:  Soft, nondistended,mild ttp over epigastrum. No rebound or guarding. Normal bowel sounds. No appreciable masses or hepatomegaly. Rectal:  Not performed.  Msk:  Symmetrical without gross deformities. Without edema, no deformity or joint abnormality.  Neurologic:  Alert and  oriented x4;  grossly normal neurologically.  Skin:   Dry and intact without significant lesions or rashes. Psychiatric:  Demonstrates good judgement and reason without abnormal affect or behaviors.  MOST RECENT LABS: CBC    Component Value Date/Time   WBC 6.0 04/01/2016 0917   RBC 4.33 04/01/2016 0917   HGB 12.6 04/01/2016 0917   HCT 37.0 04/01/2016 0917   PLT 347.0 04/01/2016 0917   MCV 85.4 04/01/2016 0917   MCH 29.5 05/12/2014 0618   MCHC 34.1 04/01/2016 0917   RDW 14.2 04/01/2016  0917   LYMPHSABS 2.2 04/01/2016 0917   MONOABS 0.7 04/01/2016 0917   EOSABS 0.3 04/01/2016 0917   BASOSABS 0.0 04/01/2016 0917    CMP     Component Value Date/Time   NA 142 04/01/2016 0917   K 4.1 04/01/2016 0917   CL 108 04/01/2016 0917   CO2 28 04/01/2016 0917   GLUCOSE 109 (H) 04/01/2016 0917   BUN 17 04/01/2016 0917   CREATININE 0.77 04/01/2016 0917   CALCIUM 9.2 04/01/2016 0917   PROT 7.1 04/01/2016 0917   ALBUMIN 4.1 04/01/2016 0917   AST 21 04/01/2016 0917   ALT 20 04/01/2016 0917   ALKPHOS 55 04/01/2016 0917   BILITOT 0.4 04/01/2016 0917   GFRNONAA 83 (L) 05/10/2014 0402   GFRAA >90 05/10/2014 0402    Assessment: 1. Dysphagia: Patient describes over the past 2 months has had an increasing dysphagia, now occurring on a daily basis with almost every meal and sometimes liquids; history of esophageal stricture dilated in 2012; consider most likely repeat stricture versus dysmotility versus other 2. GERD: Symptoms almost daily now regardless of Zegerid 3. Chronic upper abdominal pain: Refilled patient's Creon though some of this could be related to recent increase in reflux and related gastritis  Plan: 1. Scheduled patient for a barium swallow study with tablet. 2. Reviewed anti-dysphagia measures including taking small bites, chewing well, avoiding distraction while eating, drinking sips of water in between bites and the chin tuck technique. 3. Prescribed Dexilant 60 mg daily 4. Refilled patient's Creon for a year 5. Patient to follow in clinic per recommendations after barium swallow, likely the patient will need an EGD which will be scheduled with Dr. Carlean Purl in the [facility] 10/07/2016, 1:32 PM  Cc: Plotnikov, Evie Lacks, MD   Agree with Ms. Catelyn Friel's evaluation and management.  Last EGD showed severe esophageal tortuosity which can fit with dysmotility so  ba swallow good idea here. Having that 3/16. If it looks mostly like  dysmotility then try dysphagia 3 diet and f/u me  If ? Of fixed obstruction or evident can schedule for EGD + dilation  Gatha Mayer, MD, [facility]

## 2016-10-07 NOTE — Patient Instructions (Signed)
If you are age 78 or older, your body mass index should be between 23-30. Your Body mass index is 29.33 kg/m. If this is out of the aforementioned range listed, please consider follow up with your Primary Care Provider.  If you are age 41 or younger, your body mass index should be between 19-25. Your Body mass index is 29.33 kg/m. If this is out of the aformentioned range listed, please consider follow up with your Primary Care Provider.   We have sent the following medications to your pharmacy for you to pick up at your convenience:  [place] have been scheduled for a Barium Esophogram at [facility] tower entrance) on Thursday, March 15th at 10:30am. Please arrive 15 minutes prior to your appointment for registration. Make certain not to have anything to eat or drink 3 hours prior to your test. If you need to reschedule for any reason, please contact radiology at [phone] to do so. __________________________________________________________________ A barium swallow is an examination that concentrates on views of the esophagus. This tends to be a double contrast exam (barium and two liquids which, when combined, create a gas to distend the wall of the oesophagus) or single contrast (non-ionic iodine based). The study is usually tailored to your symptoms so a good history is essential. Attention is paid during the study to the form, structure and configuration of the esophagus, looking for functional disorders (such as aspiration, dysphagia, achalasia, motility and reflux) EXAMINATION You may be asked to change into a gown, depending on the type of swallow being performed. A radiologist and radiographer will perform the procedure. The radiologist will advise you of the type of contrast selected for your procedure and direct you during the exam. You will be asked to stand, sit or lie in several different positions and to hold a small amount of fluid in your  mouth before being asked to swallow while the imaging is performed .In some instances you may be asked to swallow barium coated marshmallows to assess the motility of a solid food bolus. The exam can be recorded as a digital or video fluoroscopy procedure. POST PROCEDURE It will take 1-2 days for the barium to pass through your system. To facilitate this, it is important, unless otherwise directed, to increase your fluids for the next 24-48hrs and to resume your normal diet.  This test typically takes about 30 minutes to perform. __________________________________________________________________________________  Please follow up as needed.  Thank you.

## 2016-10-10 ENCOUNTER — Encounter: Payer: Self-pay | Admitting: Physical Therapy

## 2016-10-10 ENCOUNTER — Ambulatory Visit: Payer: Medicare Other | Admitting: Physical Therapy

## 2016-10-10 DIAGNOSIS — M25572 Pain in left ankle and joints of left foot: Secondary | ICD-10-CM | POA: Diagnosis not present

## 2016-10-10 DIAGNOSIS — M25672 Stiffness of left ankle, not elsewhere classified: Secondary | ICD-10-CM

## 2016-10-10 NOTE — Therapy (Signed)
[facility] [place], [place], [address] Phone: [phone]   Fax:  [phone]  Physical Therapy Treatment  Patient Details  Name: Elizabeth Gray MRN: [id number] Date of Birth: 1940/12/04 Referring Provider: Wylene Simmer MD.  Encounter Date: 10/10/2016      PT End of Session - 10/10/16 1140    Visit Number 10   Number of Visits 24   Date for PT Re-Evaluation 11/04/16   PT Start Time 1107   PT Stop Time 1155   PT Time Calculation (min) 48 min   Activity Tolerance Patient tolerated treatment well   Behavior During Therapy [facility] for tasks assessed/performed      Past Medical History:  Diagnosis Date  . Bursitis   . External hemorrhoids without mention of complication 8921   Colonoscopy   . Fatigue   . GERD (gastroesophageal reflux disease)   . Headache(784.0)    migraines  . Heat rash    under the breasts.Marland Kitchenappeared on monday.Marland KitchenMarland KitchenBurning & itching, uses cortisone  . Hypothyroidism   . Iron deficiency anemia, unspecified   . Lower esophageal ring 2008   EGD  . Nonorganic sleep disorder, unspecified   . Osteoarthritis   . Pancreatitis   . Personal history of colonic polyps   . Polio 1948  . Pulmonary sarcoidosis ([place])   . Sleep apnea    cpap since 09 [facility]  . Vitamin B12 deficiency   . Vitamin D deficiency     Past Surgical History:  Procedure Laterality Date  . APPENDECTOMY  1988  . CARPAL TUNNEL RELEASE  2002   rt  . CHOLECYSTECTOMY  2000  . COLONOSCOPY  12/19/2006   Multiple diminutive polyps destroyed-removed (hyperpastic), internal hemorrhoids  . ESOPHAGOGASTRODUODENOSCOPY  12/19/2006   lower esophageal ring dilated  . HAND SURGERY  1997   left  . KNEE ARTHROSCOPY Bilateral    multiple 2 on lft 1 on rt  . [place] SURGERY  1995  . TOE SURGERY Left    left foot next to little toe  joint rem  . TOTAL KNEE ARTHROPLASTY Right 05/09/2014   Procedure: RIGHT TOTAL KNEE  ARTHROPLASTY;  Surgeon: [name], MD;  Location: [place];  Service: Orthopedics;  Laterality: Right;    There were no vitals filed for this visit.      Subjective Assessment - 10/10/16 1125    Subjective Patient is to continue therapy per MD, patient has some ongoing discomfort   Limitations Walking   How long can you walk comfortably? Short community distances.   Patient Stated Goals Get out of pain.   Currently in Pain? Yes   Pain Score 2    Pain Location Ankle   Pain Orientation Left   Pain Descriptors / Indicators Discomfort   Pain Type Chronic pain;Acute pain   Pain Onset More than a month ago   Pain Frequency Intermittent   Aggravating Factors  prolong activity/walking   Pain Relieving Factors at rest                         [facility] Adult PT Treatment/Exercise - 10/10/16 0001      Knee/Hip Exercises: Aerobic   Nustep L5 x10 min, UE/LE     Electrical Stimulation   Electrical Stimulation Location L lateral forefoot and lateral ankle  premod 1-_0  x 15 mins   Electrical Stimulation Goals Edema;Pain     Vasopneumatic   Number Minutes Vasopneumatic  15 minutes   Vasopnuematic Location  Ankle   Vasopneumatic Pressure Medium     Manual Therapy   Manual Therapy Soft tissue mobilization   Soft tissue mobilization STW along distal LT malleolus area       Ankle Exercises: Standing   Rocker Board 4 minutes   Other Standing Ankle Exercises L ankle dynadisc PF/DF, Inv/Ev, circles x20 reps     Ankle Exercises: Seated   Other Seated Ankle Exercises L ankle isolator 1# DF/Inv/Ev x20 reps each                  PT Short Term Goals - 09/05/16 1210      PT SHORT TERM GOAL #1   Title STG's=LTG's.           PT Long Term Goals - 10/10/16 1142      PT LONG TERM GOAL #1   Title independent with a HEP.   Time 8   Period Weeks   Status Achieved     PT LONG TERM GOAL #2   Title Increase left ankle dorsiflexion to 10 degrees to normalize  the patient's gait pattern   Time 8   Period Weeks   Status Achieved  AROM 12 degrees 10/10/16     PT LONG TERM GOAL #3   Title Increase left ankle strength to 5/5 to increase stability for functional tasks   Time 8   Period Weeks   Status On-going     PT LONG TERM GOAL #4   Title Perform ADL's with pain not > 2-3/10.   Time 8   Period Weeks   Status Achieved     PT LONG TERM GOAL #5   Title No episodes of left ankle instability.   Time 8   Period Weeks   Status Achieved               Plan - 10/10/16 1146    Clinical Impression Statement Patient tolerated treatment well today. Patient has improved with ROM and met LTG #2 today. Patient continues to wear her brace daily. Patient has palpable pain lateral left ankle. Patient FOTO 43% limitation (initial 51%). Strength goals ongoing due to strength deficts.   Rehab Potential Excellent   PT Frequency 2x / week   PT Duration 6 weeks   PT Treatment/Interventions Cryotherapy;Electrical Stimulation;Moist Heat;Ultrasound;Patient/family education;Neuromuscular re-education;Therapeutic exercise;Therapeutic activities;Manual techniques;Passive range of motion   PT Next Visit Plan cont with POC and progress with strengthening / ankle stabilization   Consulted and Agree with Plan of Care Patient      Patient will benefit from skilled therapeutic intervention in order to improve the following deficits and impairments:  Pain, Decreased activity tolerance, Decreased strength, Decreased range of motion  Visit Diagnosis: Pain in left ankle and joints of left foot  Stiffness of left ankle, not elsewhere classified     Problem List Patient Active Problem List   Diagnosis Date Noted  . Osteopenia 09/05/2016  . Upper respiratory infection 09/05/2016  . Chest pain 03/04/2016  . Degenerative arthritis of right knee 05/09/2014  . Headache(784.0) 07/19/2013  . Tinnitus 07/19/2013  . Dry skin dermatitis 08/31/2012  . Left hip pain  08/31/2012  . Cerumen impaction 08/31/2012  . Well adult exam 05/25/2012  . Upper abdominal pain-chronic 04/02/2011  . Thumb pain 03/11/2011  . Donor, blood 03/11/2011  . Shoulder pain 03/11/2011  . OSTEOARTHRITIS 09/10/2010  . VERTIGO 03/05/2010  . OSA on CPAP 07/22/2008  . B12 deficiency 08/16/2007  .  Vitamin D deficiency 08/16/2007  . 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 SARCOIDOSIS, PULMONARY 05/09/2007  . Hypothyroidism 05/09/2007  . ALLERGIC RHINITIS 05/09/2007  . GERD 05/09/2007  . Personal history of colon polyps 05/09/2007    Ladean Raya, PTA 10/10/16 11:55 AM  [facility] [place], [place], [address] Phone: [phone]   Fax:  [phone]  Name: INESS PANGILINAN MRN: [id number] Date of Birth: 23-Jul-1941

## 2016-10-13 ENCOUNTER — Ambulatory Visit (HOSPITAL_COMMUNITY): Payer: Medicare Other

## 2016-10-14 ENCOUNTER — Ambulatory Visit (HOSPITAL_COMMUNITY)
Admission: RE | Admit: 2016-10-14 | Discharge: 2016-10-14 | Disposition: A | Discharge status: Home / Self Care | Payer: Medicare Other | Source: Ambulatory Visit | Attending: Physician Assistant | Admitting: Physician Assistant

## 2016-10-14 ENCOUNTER — Ambulatory Visit: Payer: Medicare Other | Admitting: Physical Therapy

## 2016-10-14 DIAGNOSIS — R1013 Epigastric pain: Secondary | ICD-10-CM | POA: Diagnosis not present

## 2016-10-14 DIAGNOSIS — M25572 Pain in left ankle and joints of left foot: Secondary | ICD-10-CM

## 2016-10-14 DIAGNOSIS — R1310 Dysphagia, unspecified: Secondary | ICD-10-CM | POA: Diagnosis not present

## 2016-10-14 DIAGNOSIS — R1314 Dysphagia, pharyngoesophageal phase: Secondary | ICD-10-CM

## 2016-10-14 DIAGNOSIS — K449 Diaphragmatic hernia without obstruction or gangrene: Secondary | ICD-10-CM | POA: Diagnosis not present

## 2016-10-14 DIAGNOSIS — M25672 Stiffness of left ankle, not elsewhere classified: Secondary | ICD-10-CM | POA: Diagnosis not present

## 2016-10-14 NOTE — Therapy (Signed)
[facility] [place], [place], [address] Phone: [phone]   Fax:  [phone]  Physical Therapy Treatment  Patient Details  Name: Elizabeth Gray MRN: [id number] Date of Birth: 1941/04/19 Referring Provider: Wylene Simmer MD.  Encounter Date: 10/14/2016      PT End of Session - 10/14/16 1132    Visit Number 11   Number of Visits 24   Date for PT Re-Evaluation 11/04/16   PT Start Time 1034   PT Stop Time 1122   PT Time Calculation (min) 48 min   Activity Tolerance Patient tolerated treatment well   Behavior During Therapy [facility] for tasks assessed/performed      Past Medical History:  Diagnosis Date  . Bursitis   . External hemorrhoids without mention of complication 1761   Colonoscopy   . Fatigue   . GERD (gastroesophageal reflux disease)   . Headache(784.0)    migraines  . Heat rash    under the breasts.Marland Kitchenappeared on monday.Marland KitchenMarland KitchenBurning & itching, uses cortisone  . Hypothyroidism   . Iron deficiency anemia, unspecified   . Lower esophageal ring 2008   EGD  . Nonorganic sleep disorder, unspecified   . Osteoarthritis   . Pancreatitis   . Personal history of colonic polyps   . Polio 1948  . Pulmonary sarcoidosis ([place])   . Sleep apnea    cpap since 09 [facility]  . Vitamin B12 deficiency   . Vitamin D deficiency     Past Surgical History:  Procedure Laterality Date  . APPENDECTOMY  1988  . CARPAL TUNNEL RELEASE  2002   rt  . CHOLECYSTECTOMY  2000  . COLONOSCOPY  12/19/2006   Multiple diminutive polyps destroyed-removed (hyperpastic), internal hemorrhoids  . ESOPHAGOGASTRODUODENOSCOPY  12/19/2006   lower esophageal ring dilated  . HAND SURGERY  1997   left  . KNEE ARTHROSCOPY Bilateral    multiple 2 on lft 1 on rt  . [place] SURGERY  1995  . TOE SURGERY Left    left foot next to little toe  joint rem  . TOTAL KNEE ARTHROPLASTY Right 05/09/2014   Procedure: RIGHT TOTAL KNEE  ARTHROPLASTY;  Surgeon: [name], MD;  Location: [place];  Service: Orthopedics;  Laterality: Right;    There were no vitals filed for this visit.      Subjective Assessment - 10/14/16 1127    Subjective I had to leave work early this week due to ankle pain.   Pain Score 4    Pain Location Ankle   Pain Orientation Left   Pain Descriptors / Indicators Discomfort   Pain Type Chronic pain   Pain Onset More than a month ago                         [facility] Adult PT Treatment/Exercise - 10/14/16 0001      Knee/Hip Exercises: Aerobic   Nustep Level 5 x 10 minutes.     Modalities   Modalities Ultrasound     Electrical Stimulation   Electrical Stimulation Location Left lateral ankle and distal lateral left lower leg.   Electrical Stimulation Action 4 channnel pre-mod   Electrical Stimulation Parameters Constant at 80-150 Hz x 20 minutes.   Electrical Stimulation Goals Edema;Pain     Ultrasound   Ultrasound Location Left lateral ankle and distal lateral lower leg.   Ultrasound Parameters 1.50 W/CM2 at 50% x 13 minutes.with small soundhead.   Ultrasound  Goals Pain     Vasopneumatic   Number Minutes Vasopneumatic  20 minutes   Vasopnuematic Location  --  Left ankle.   Vasopneumatic Pressure Medium                  PT Short Term Goals - 09/05/16 1210      PT SHORT TERM GOAL #1   Title STG's=LTG's.           PT Long Term Goals - 10/10/16 1142      PT LONG TERM GOAL #1   Title independent with a HEP.   Time 8   Period Weeks   Status Achieved     PT LONG TERM GOAL #2   Title Increase left ankle dorsiflexion to 10 degrees to normalize the patient's gait pattern   Time 8   Period Weeks   Status Achieved  AROM 12 degrees 10/10/16     PT LONG TERM GOAL #3   Title Increase left ankle strength to 5/5 to increase stability for functional tasks   Time 8   Period Weeks   Status On-going     PT LONG TERM GOAL #4   Title Perform ADL's with  pain not > 2-3/10.   Time 8   Period Weeks   Status Achieved     PT LONG TERM GOAL #5   Title No episodes of left ankle instability.   Time 8   Period Weeks   Status Achieved               Plan - 10/14/16 1128    Clinical Impression Statement Patient had a significant flare-up this week such that she had to leave work early. She reports pain from her right ATFL region and distal 1/3 of lateral lower leg.  She feels it was due to overdoing doing her home exercises and putting too much tension on the red theraband she was using.  I dropped her to a yellow piece of theraband for home use and recommended she do exercise with low tension.      Patient will benefit from skilled therapeutic intervention in order to improve the following deficits and impairments:  Pain, Decreased activity tolerance, Decreased strength, Decreased range of motion  Visit Diagnosis: Pain in left ankle and joints of left foot  Stiffness of left ankle, not elsewhere classified     Problem List Patient Active Problem List   Diagnosis Date Noted  . Osteopenia 09/05/2016  . Upper respiratory infection 09/05/2016  . Chest pain 03/04/2016  . Degenerative arthritis of right knee 05/09/2014  . Headache(784.0) 07/19/2013  . Tinnitus 07/19/2013  . Dry skin dermatitis 08/31/2012  . Left hip pain 08/31/2012  . Cerumen impaction 08/31/2012  . Well adult exam 05/25/2012  . Upper abdominal pain-chronic 04/02/2011  . Thumb pain 03/11/2011  . Donor, blood 03/11/2011  . Shoulder pain 03/11/2011  . OSTEOARTHRITIS 09/10/2010  . VERTIGO 03/05/2010  . OSA on CPAP 07/22/2008  . B12 deficiency 08/16/2007  . Vitamin D deficiency 08/16/2007  . 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 SARCOIDOSIS, PULMONARY 05/09/2007  . Hypothyroidism 05/09/2007  . ALLERGIC RHINITIS 05/09/2007  . GERD 05/09/2007  . Personal history of colon polyps 05/09/2007    Levora Werden, [place] MPT 10/14/2016, 12:36 PM  [facility] [address] [place], [place], [address] Phone: [phone]   Fax:  [phone]  Name: DEKLYN TRACHTENBERG MRN: [id number] Date of Birth: 09/25/1940

## 2016-10-18 ENCOUNTER — Ambulatory Visit: Payer: Medicare Other | Admitting: Physical Therapy

## 2016-10-18 ENCOUNTER — Encounter: Payer: Self-pay | Admitting: Physical Therapy

## 2016-10-18 DIAGNOSIS — M25572 Pain in left ankle and joints of left foot: Secondary | ICD-10-CM | POA: Diagnosis not present

## 2016-10-18 DIAGNOSIS — M25672 Stiffness of left ankle, not elsewhere classified: Secondary | ICD-10-CM

## 2016-10-18 NOTE — Therapy (Signed)
[facility] [place], [place], [address] Phone: [phone]   Fax:  [phone]  Physical Therapy Treatment  Patient Details  Name: Elizabeth Gray MRN: [id number] Date of Birth: 04-Mar-1941 Referring Provider: Wylene Simmer MD.  Encounter Date: 10/18/2016      PT End of Session - 10/18/16 0826    Visit Number 12   Number of Visits 24   Date for PT Re-Evaluation 11/04/16   PT Start Time 0817   PT Stop Time 0911   PT Time Calculation (min) 54 min   Activity Tolerance Patient tolerated treatment well   Behavior During Therapy [facility] for tasks assessed/performed      Past Medical History:  Diagnosis Date  . Bursitis   . External hemorrhoids without mention of complication 6734   Colonoscopy   . Fatigue   . GERD (gastroesophageal reflux disease)   . Headache(784.0)    migraines  . Heat rash    under the breasts.Marland Kitchenappeared on monday.Marland KitchenMarland KitchenBurning & itching, uses cortisone  . Hypothyroidism   . Iron deficiency anemia, unspecified   . Lower esophageal ring 2008   EGD  . Nonorganic sleep disorder, unspecified   . Osteoarthritis   . Pancreatitis   . Personal history of colonic polyps   . Polio 1948  . Pulmonary sarcoidosis ([place])   . Sleep apnea    cpap since 09 [facility]  . Vitamin B12 deficiency   . Vitamin D deficiency     Past Surgical History:  Procedure Laterality Date  . APPENDECTOMY  1988  . CARPAL TUNNEL RELEASE  2002   rt  . CHOLECYSTECTOMY  2000  . COLONOSCOPY  12/19/2006   Multiple diminutive polyps destroyed-removed (hyperpastic), internal hemorrhoids  . ESOPHAGOGASTRODUODENOSCOPY  12/19/2006   lower esophageal ring dilated  . HAND SURGERY  1997   left  . KNEE ARTHROSCOPY Bilateral    multiple 2 on lft 1 on rt  . [place] SURGERY  1995  . TOE SURGERY Left    left foot next to little toe  joint rem  . TOTAL KNEE ARTHROPLASTY Right 05/09/2014   Procedure: RIGHT TOTAL KNEE  ARTHROPLASTY;  Surgeon: [name], MD;  Location: [place];  Service: Orthopedics;  Laterality: Right;    There were no vitals filed for this visit.      Subjective Assessment - 10/18/16 0823    Subjective Reports improvement last treatment but still having pain.   Limitations Walking   How long can you walk comfortably? Short community distances.   Patient Stated Goals Get out of pain.   Currently in Pain? Yes   Pain Score 3    Pain Location Ankle   Pain Orientation Left   Pain Descriptors / Indicators Aching   Pain Type Chronic pain   Pain Radiating Towards anteriolateral L calf   Pain Onset More than a month ago            [facility] PT Assessment - 10/18/16 0001      Assessment   Medical Diagnosis Left ankle sprain.   Next MD Visit 10/2016     Restrictions   Weight Bearing Restrictions No                     [facility] Adult PT Treatment/Exercise - 10/18/16 0001      Knee/Hip Exercises: Aerobic   Nustep Level 6 x 10 minutes.     Modalities   Modalities Electrical Stimulation;Ultrasound;Vasopneumatic  Acupuncturist Location L lateral ankle   Electrical Stimulation Action Pre-Mod   Electrical Stimulation Parameters 80-150 hz x15 min   Electrical Stimulation Goals Pain     Ultrasound   Ultrasound Location L lateral ankle   Ultrasound Parameters 1.0 w/cm2, 100%, 3.3 mhz x10 min   Ultrasound Goals Pain     Vasopneumatic   Number Minutes Vasopneumatic  15 minutes   Vasopnuematic Location  Ankle   Vasopneumatic Pressure Medium   Vasopneumatic Temperature  59     Ankle Exercises: Standing   Rocker Board 3 minutes   Heel Raises 20 reps   Toe Raise 20 reps     Ankle Exercises: Seated   Other Seated Ankle Exercises L ankle isolator 1# DF/Inv/Ev x20 reps each   Other Seated Ankle Exercises Seated L ankle prostretch x20 reps                  PT Short Term Goals - 09/05/16 1210      PT SHORT TERM GOAL  #1   Title STG's=LTG's.           PT Long Term Goals - 10/10/16 1142      PT LONG TERM GOAL #1   Title independent with a HEP.   Time 8   Period Weeks   Status Achieved     PT LONG TERM GOAL #2   Title Increase left ankle dorsiflexion to 10 degrees to normalize the patient's gait pattern   Time 8   Period Weeks   Status Achieved  AROM 12 degrees 10/10/16     PT LONG TERM GOAL #3   Title Increase left ankle strength to 5/5 to increase stability for functional tasks   Time 8   Period Weeks   Status On-going     PT LONG TERM GOAL #4   Title Perform ADL's with pain not > 2-3/10.   Time 8   Period Weeks   Status Achieved     PT LONG TERM GOAL #5   Title No episodes of left ankle instability.   Time 8   Period Weeks   Status Achieved               Plan - 10/18/16 8185    Clinical Impression Statement Patient continues to tolerate treatment well although she is still reporting pain along L ATFL. No complaints during any L ankle stretching or strengthening today. [place] completed to L lateral ankle/ATFL region wtih normal response. No edema observed today along L ankle. Normal modalities response noted following removal of the modalities.   Rehab Potential Excellent   PT Frequency 2x / week   PT Duration 6 weeks   PT Treatment/Interventions Cryotherapy;Electrical Stimulation;Moist Heat;Ultrasound;Patient/family education;Neuromuscular re-education;Therapeutic exercise;Therapeutic activities;Manual techniques;Passive range of motion   PT Next Visit Plan cont with POC and progress with strengthening / ankle stabilization   Consulted and Agree with Plan of Care Patient      Patient will benefit from skilled therapeutic intervention in order to improve the following deficits and impairments:  Pain, Decreased activity tolerance, Decreased strength, Decreased range of motion  Visit Diagnosis: Pain in left ankle and joints of left foot  Stiffness of left ankle, not  elsewhere classified     Problem List Patient Active Problem List   Diagnosis Date Noted  . Osteopenia 09/05/2016  . Upper respiratory infection 09/05/2016  . Chest pain 03/04/2016  . Degenerative arthritis of right knee 05/09/2014  . Headache(784.0) 07/19/2013  .  Tinnitus 07/19/2013  . Dry skin dermatitis 08/31/2012  . Left hip pain 08/31/2012  . Cerumen impaction 08/31/2012  . Well adult exam 05/25/2012  . Upper abdominal pain-chronic 04/02/2011  . Thumb pain 03/11/2011  . Donor, blood 03/11/2011  . Shoulder pain 03/11/2011  . OSTEOARTHRITIS 09/10/2010  . VERTIGO 03/05/2010  . OSA on CPAP 07/22/2008  . B12 deficiency 08/16/2007  . Vitamin D deficiency 08/16/2007  . 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 SARCOIDOSIS, PULMONARY 05/09/2007  . Hypothyroidism 05/09/2007  . ALLERGIC RHINITIS 05/09/2007  . GERD 05/09/2007  . Personal history of colon polyps 05/09/2007    Wynelle Fanny, PTA 10/18/2016, 9:12 AM  [facility] [address] [place], [place], [address] Phone: [phone]   Fax:  [phone]  Name: Elizabeth Gray MRN: [id number] Date of Birth: 1940-09-05

## 2016-10-20 ENCOUNTER — Ambulatory Visit: Payer: Medicare Other | Admitting: Physical Therapy

## 2016-10-20 ENCOUNTER — Encounter: Payer: Self-pay | Admitting: Physical Therapy

## 2016-10-20 DIAGNOSIS — M25672 Stiffness of left ankle, not elsewhere classified: Secondary | ICD-10-CM | POA: Diagnosis not present

## 2016-10-20 DIAGNOSIS — M25572 Pain in left ankle and joints of left foot: Secondary | ICD-10-CM | POA: Diagnosis not present

## 2016-10-20 NOTE — Therapy (Signed)
[facility] [place], [place], [address] Phone: [phone]   Fax:  [phone]  Physical Therapy Treatment  Patient Details  Name: Elizabeth Gray MRN: [id number] Date of Birth: February 14, 1941 Referring Provider: Wylene Simmer MD.  Encounter Date: 10/20/2016      PT End of Session - 10/20/16 1808    Visit Number 13   Number of Visits 24   Date for PT Re-Evaluation 11/04/16   PT Start Time 0400   PT Stop Time 0452   PT Time Calculation (min) 52 min   Activity Tolerance Patient tolerated treatment well   Behavior During Therapy [facility] for tasks assessed/performed      Past Medical History:  Diagnosis Date  . Bursitis   . External hemorrhoids without mention of complication 7342   Colonoscopy   . Fatigue   . GERD (gastroesophageal reflux disease)   . Headache(784.0)    migraines  . Heat rash    under the breasts.Marland Kitchenappeared on monday.Marland KitchenMarland KitchenBurning & itching, uses cortisone  . Hypothyroidism   . Iron deficiency anemia, unspecified   . Lower esophageal ring 2008   EGD  . Nonorganic sleep disorder, unspecified   . Osteoarthritis   . Pancreatitis   . Personal history of colonic polyps   . Polio 1948  . Pulmonary sarcoidosis ([place])   . Sleep apnea    cpap since 09 [facility]  . Vitamin B12 deficiency   . Vitamin D deficiency     Past Surgical History:  Procedure Laterality Date  . APPENDECTOMY  1988  . CARPAL TUNNEL RELEASE  2002   rt  . CHOLECYSTECTOMY  2000  . COLONOSCOPY  12/19/2006   Multiple diminutive polyps destroyed-removed (hyperpastic), internal hemorrhoids  . ESOPHAGOGASTRODUODENOSCOPY  12/19/2006   lower esophageal ring dilated  . HAND SURGERY  1997   left  . KNEE ARTHROSCOPY Bilateral    multiple 2 on lft 1 on rt  . [place] SURGERY  1995  . TOE SURGERY Left    left foot next to little toe  joint rem  . TOTAL KNEE ARTHROPLASTY Right 05/09/2014   Procedure: RIGHT TOTAL KNEE  ARTHROPLASTY;  Surgeon: [name], MD;  Location: [facility];  Service: Orthopedics;  Laterality: Right;    There were no vitals filed for this visit.      Subjective Assessment - 10/20/16 1814    Subjective My ankle is doing better since I quit doing the band exercises.   Pain Score 2    Pain Location Ankle   Pain Orientation Left   Pain Descriptors / Indicators Aching   Pain Type Chronic pain   Pain Onset More than a month ago                         [facility] Adult PT Treatment/Exercise - 10/20/16 0001      Exercises   Exercises Knee/Hip     Knee/Hip Exercises: Aerobic   Nustep Level 6 x 10 minutes.     Modalities   Modalities Electrical Stimulation;Ultrasound;Vasopneumatic     Electrical Stimulation   Electrical Stimulation Location left lateral ankle.   Electrical Stimulation Action 4 channel Pre-mod.   Electrical Stimulation Parameters 80-150 Hz x 15 minutes.   Electrical Stimulation Goals Pain     Ultrasound   Ultrasound Location Left lateral ankle.   Ultrasound Parameters 1.20 W/CM2 3.3 MHZ at 50% x 13 minutes.     Vasopneumatic  Number Minutes Vasopneumatic  20 minutes   Vasopnuematic Location  --  Left ankle.   Vasopneumatic Pressure Medium                  PT Short Term Goals - 09/05/16 1210      PT SHORT TERM GOAL #1   Title STG's=LTG's.           PT Long Term Goals - 10/10/16 1142      PT LONG TERM GOAL #1   Title independent with a HEP.   Time 8   Period Weeks   Status Achieved     PT LONG TERM GOAL #2   Title Increase left ankle dorsiflexion to 10 degrees to normalize the patient's gait pattern   Time 8   Period Weeks   Status Achieved  AROM 12 degrees 10/10/16     PT LONG TERM GOAL #3   Title Increase left ankle strength to 5/5 to increase stability for functional tasks   Time 8   Period Weeks   Status On-going     PT LONG TERM GOAL #4   Title Perform ADL's with pain not > 2-3/10.   Time 8   Period  Weeks   Status Achieved     PT LONG TERM GOAL #5   Title No episodes of left ankle instability.   Time 8   Period Weeks   Status Achieved               Plan - 10/20/16 1814    Clinical Impression Statement Patient with low pain-level after treatment today.  She quit doing the band exercises at home.  I recommended that she only use the yellow theraband and low tension.      Patient will benefit from skilled therapeutic intervention in order to improve the following deficits and impairments:  Pain, Decreased activity tolerance, Decreased strength, Decreased range of motion  Visit Diagnosis: Pain in left ankle and joints of left foot  Stiffness of left ankle, not elsewhere classified     Problem List Patient Active Problem List   Diagnosis Date Noted  . Osteopenia 09/05/2016  . Upper respiratory infection 09/05/2016  . Chest pain 03/04/2016  . Degenerative arthritis of right knee 05/09/2014  . Headache(784.0) 07/19/2013  . Tinnitus 07/19/2013  . Dry skin dermatitis 08/31/2012  . Left hip pain 08/31/2012  . Cerumen impaction 08/31/2012  . Well adult exam 05/25/2012  . Upper abdominal pain-chronic 04/02/2011  . Thumb pain 03/11/2011  . Donor, blood 03/11/2011  . Shoulder pain 03/11/2011  . OSTEOARTHRITIS 09/10/2010  . VERTIGO 03/05/2010  . OSA on CPAP 07/22/2008  . B12 deficiency 08/16/2007  . Vitamin D deficiency 08/16/2007  . 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 SARCOIDOSIS, PULMONARY 05/09/2007  . Hypothyroidism 05/09/2007  . ALLERGIC RHINITIS 05/09/2007  . GERD 05/09/2007  . Personal history of colon polyps 05/09/2007    Elizabeth Gray, Elizabeth Gray 10/20/2016, 6:21 PM  [facility] [address] [place], [place], [address] Phone: [phone]   Fax:  [phone]  Name: Elizabeth Gray MRN: [id number] Date of Birth: [birth year]

## 2016-10-24 ENCOUNTER — Encounter: Payer: Self-pay | Admitting: Physical Therapy

## 2016-10-24 ENCOUNTER — Ambulatory Visit: Payer: Medicare Other | Admitting: Physical Therapy

## 2016-10-24 DIAGNOSIS — M25572 Pain in left ankle and joints of left foot: Secondary | ICD-10-CM | POA: Diagnosis not present

## 2016-10-24 DIAGNOSIS — M25672 Stiffness of left ankle, not elsewhere classified: Secondary | ICD-10-CM | POA: Diagnosis not present

## 2016-10-24 NOTE — Therapy (Signed)
[facility] [place], [place], [address] Phone: [phone]   Fax:  [phone]  Physical Therapy Treatment  Patient Details  Name: Elizabeth Gray MRN: [id number] Date of Birth: 1941-01-09 Referring Provider: Wylene Simmer MD.  Encounter Date: 10/24/2016      PT End of Session - 10/24/16 0750    Visit Number 14   Number of Visits 24   Date for PT Re-Evaluation 11/04/16   PT Start Time 0734   PT Stop Time 0825   PT Time Calculation (min) 51 min   Activity Tolerance Patient tolerated treatment well   Behavior During Therapy [facility] for tasks assessed/performed      Past Medical History:  Diagnosis Date  . Bursitis   . External hemorrhoids without mention of complication 6962   Colonoscopy   . Fatigue   . GERD (gastroesophageal reflux disease)   . Headache(784.0)    migraines  . Heat rash    under the breasts.Marland Kitchenappeared on monday.Marland KitchenMarland KitchenBurning & itching, uses cortisone  . Hypothyroidism   . Iron deficiency anemia, unspecified   . Lower esophageal ring 2008   EGD  . Nonorganic sleep disorder, unspecified   . Osteoarthritis   . Pancreatitis   . Personal history of colonic polyps   . Polio 1948  . Pulmonary sarcoidosis ([place])   . Sleep apnea    cpap since 09 [facility]  . Vitamin B12 deficiency   . Vitamin D deficiency     Past Surgical History:  Procedure Laterality Date  . APPENDECTOMY  1988  . CARPAL TUNNEL RELEASE  2002   rt  . CHOLECYSTECTOMY  2000  . COLONOSCOPY  12/19/2006   Multiple diminutive polyps destroyed-removed (hyperpastic), internal hemorrhoids  . ESOPHAGOGASTRODUODENOSCOPY  12/19/2006   lower esophageal ring dilated  . HAND SURGERY  1997   left  . KNEE ARTHROSCOPY Bilateral    multiple 2 on lft 1 on rt  . [place] SURGERY  1995  . TOE SURGERY Left    left foot next to little toe  joint rem  . TOTAL KNEE ARTHROPLASTY Right 05/09/2014   Procedure: RIGHT TOTAL KNEE  ARTHROPLASTY;  Surgeon: [name], MD;  Location: [facility];  Service: Orthopedics;  Laterality: Right;    There were no vitals filed for this visit.      Subjective Assessment - 10/24/16 0738    Subjective Patient arrived with some soreness after doing ADL's (outside carried wood and slightly felt she twisted ankle and not wearing brace at the time)   Limitations Walking   How long can you walk comfortably? Short community distances.   Patient Stated Goals Get out of pain.   Currently in Pain? Yes   Pain Score 1    Pain Location Finger (Comment which one)   Pain Orientation Left   Pain Descriptors / Indicators --  twinge   Pain Type Chronic pain   Pain Onset More than a month ago   Pain Frequency Intermittent   Aggravating Factors  prolong activity   Pain Relieving Factors at rest                         [facility] Adult PT Treatment/Exercise - 10/24/16 0001      Knee/Hip Exercises: Aerobic   Nustep Level 6 x 10 minutes.     Acupuncturist Location left lateral ankle.   Electrical Stimulation Action premod  Electrical Stimulation Parameters 80-150hz  x5min   Electrical Stimulation Goals Pain     Ultrasound   Ultrasound Location left lat ankle   Ultrasound Parameters 1.2w/cm2/50%/3.35mhz x28min   Ultrasound Goals Pain     Vasopneumatic   Number Minutes Vasopneumatic  15 minutes   Vasopnuematic Location  Ankle   Vasopneumatic Pressure Medium     Ankle Exercises: Seated   Other Seated Ankle Exercises L ankle isolator 1# DF and circles x20 reps each                  PT Short Term Goals - 09/05/16 1210      PT SHORT TERM GOAL #1   Title STG's=LTG's.           PT Long Term Goals - 10/10/16 1142      PT LONG TERM GOAL #1   Title independent with a HEP.   Time 8   Period Weeks   Status Achieved     PT LONG TERM GOAL #2   Title Increase left ankle dorsiflexion to 10 degrees to normalize the  patient's gait pattern   Time 8   Period Weeks   Status Achieved  AROM 12 degrees 10/10/16     PT LONG TERM GOAL #3   Title Increase left ankle strength to 5/5 to increase stability for functional tasks   Time 8   Period Weeks   Status On-going     PT LONG TERM GOAL #4   Title Perform ADL's with pain not > 2-3/10.   Time 8   Period Weeks   Status Achieved     PT LONG TERM GOAL #5   Title No episodes of left ankle instability.   Time 8   Period Weeks   Status Achieved               Plan - 10/24/16 0815    Clinical Impression Statement Patient progressing with therapy and has good response thus far. Patient has little pain overall. Patient feels a slight "twinge" at times when not wearing brace. Patient was outside and not wearing her brace when she slightly twisted ankle. Patient able to perform seated exercises with no difficulty. Patient progressing toward goal yet ongoing due to strength defict.    Rehab Potential Excellent   PT Frequency 2x / week   PT Duration 6 weeks   PT Treatment/Interventions Cryotherapy;Electrical Stimulation;Moist Heat;Ultrasound;Patient/family education;Neuromuscular re-education;Therapeutic exercise;Therapeutic activities;Manual techniques;Passive range of motion   PT Next Visit Plan cont with POC and progress with strengthening / ankle stabilization   Consulted and Agree with Plan of Care Patient      Patient will benefit from skilled therapeutic intervention in order to improve the following deficits and impairments:  Pain, Decreased activity tolerance, Decreased strength, Decreased range of motion  Visit Diagnosis: Pain in left ankle and joints of left foot  Stiffness of left ankle, not elsewhere classified     Problem List Patient Active Problem List   Diagnosis Date Noted  . Osteopenia 09/05/2016  . Upper respiratory infection 09/05/2016  . Chest pain 03/04/2016  . Degenerative arthritis of right knee 05/09/2014  .  Headache(784.0) 07/19/2013  . Tinnitus 07/19/2013  . Dry skin dermatitis 08/31/2012  . Left hip pain 08/31/2012  . Cerumen impaction 08/31/2012  . Well adult exam 05/25/2012  . Upper abdominal pain-chronic 04/02/2011  . Thumb pain 03/11/2011  . Donor, blood 03/11/2011  . Shoulder pain 03/11/2011  . OSTEOARTHRITIS 09/10/2010  . VERTIGO 03/05/2010  .  OSA on CPAP 07/22/2008  . B12 deficiency 08/16/2007  . Vitamin D deficiency 08/16/2007  . 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 SARCOIDOSIS, PULMONARY 05/09/2007  . Hypothyroidism 05/09/2007  . ALLERGIC RHINITIS 05/09/2007  . GERD 05/09/2007  . Personal history of colon polyps 05/09/2007    Phillips Climes, PTA 10/24/2016, 8:29 AM  [facility] [place], [place], [address] Phone: [phone]   Fax:  [phone]  Name: Elizabeth Gray MRN: [id number] Date of Birth: 1941/07/17

## 2016-10-26 ENCOUNTER — Ambulatory Visit: Payer: Medicare Other | Admitting: Physical Therapy

## 2016-10-26 ENCOUNTER — Encounter: Payer: Self-pay | Admitting: Physical Therapy

## 2016-10-26 DIAGNOSIS — M25572 Pain in left ankle and joints of left foot: Secondary | ICD-10-CM | POA: Diagnosis not present

## 2016-10-26 DIAGNOSIS — M25672 Stiffness of left ankle, not elsewhere classified: Secondary | ICD-10-CM

## 2016-10-26 NOTE — Therapy (Signed)
[facility] [place], [place], [address] Phone: [phone]   Fax:  [phone]  Physical Therapy Treatment  Patient Details  Name: Elizabeth Gray MRN: [id number] Date of Birth: 1940-11-30 Referring Provider: Wylene Simmer MD.  Encounter Date: 10/26/2016      PT End of Session - 10/26/16 1447    Visit Number 15   Number of Visits 24   Date for PT Re-Evaluation 11/04/16   PT Start Time 7096   PT Stop Time 1525   PT Time Calculation (min) 42 min   Activity Tolerance Patient tolerated treatment well   Behavior During Therapy [facility] for tasks assessed/performed      Past Medical History:  Diagnosis Date  . Bursitis   . External hemorrhoids without mention of complication 2836   Colonoscopy   . Fatigue   . GERD (gastroesophageal reflux disease)   . Headache(784.0)    migraines  . Heat rash    under the breasts.Marland Kitchenappeared on monday.Marland KitchenMarland KitchenBurning & itching, uses cortisone  . Hypothyroidism   . Iron deficiency anemia, unspecified   . Lower esophageal ring 2008   EGD  . Nonorganic sleep disorder, unspecified   . Osteoarthritis   . Pancreatitis   . Personal history of colonic polyps   . Polio 1948  . Pulmonary sarcoidosis ([place])   . Sleep apnea    cpap since 09 [facility]  . Vitamin B12 deficiency   . Vitamin D deficiency     Past Surgical History:  Procedure Laterality Date  . APPENDECTOMY  1988  . CARPAL TUNNEL RELEASE  2002   rt  . CHOLECYSTECTOMY  2000  . COLONOSCOPY  12/19/2006   Multiple diminutive polyps destroyed-removed (hyperpastic), internal hemorrhoids  . ESOPHAGOGASTRODUODENOSCOPY  12/19/2006   lower esophageal ring dilated  . HAND SURGERY  1997   left  . KNEE ARTHROSCOPY Bilateral    multiple 2 on lft 1 on rt  . [place] SURGERY  1995  . TOE SURGERY Left    left foot next to little toe  joint rem  . TOTAL KNEE ARTHROPLASTY Right 05/09/2014   Procedure: RIGHT TOTAL KNEE  ARTHROPLASTY;  Surgeon: [name], MD;  Location: [place];  Service: Orthopedics;  Laterality: Right;    There were no vitals filed for this visit.      Subjective Assessment - 10/26/16 1445    Subjective Patient arrived without brace today and reported little pain   Limitations Walking   How long can you walk comfortably? Short community distances.   Patient Stated Goals Get out of pain.   Currently in Pain? Yes   Pain Score 1    Pain Location Ankle   Pain Orientation Left   Pain Descriptors / Indicators --  twinge   Pain Type Chronic pain   Pain Onset More than a month ago   Pain Frequency Intermittent   Aggravating Factors  prolong walking or activity   Pain Relieving Factors at rest                         [facility] Adult PT Treatment/Exercise - 10/26/16 0001      Knee/Hip Exercises: Aerobic   Nustep Level 6 x 10 minutes.     Acupuncturist Location left lateral ankle.   Electrical Stimulation Action premod   Electrical Stimulation Parameters 80-150hz  x69mim   Electrical Stimulation Goals Pain     Vasopneumatic  Number Minutes Vasopneumatic  15 minutes   Vasopnuematic Location  Ankle   Vasopneumatic Pressure Medium     Ankle Exercises: Standing   SLS L LE x5   Rocker Board 3 minutes     Ankle Exercises: Seated   Other Seated Ankle Exercises L ankle isolator 1 1/2 # DF and circles x20 reps each, inv/ever 1/2# 2x10 each     Ankle Exercises: Supine   T-Band supine 4 way ankle with red t-band 2x10 each                  PT Short Term Goals - 09/05/16 1210      PT SHORT TERM GOAL #1   Title STG's=LTG's.           PT Long Term Goals - 10/10/16 1142      PT LONG TERM GOAL #1   Title independent with a HEP.   Time 8   Period Weeks   Status Achieved     PT LONG TERM GOAL #2   Title Increase left ankle dorsiflexion to 10 degrees to normalize the patient's gait pattern   Time 8   Period  Weeks   Status Achieved  AROM 12 degrees 10/10/16     PT LONG TERM GOAL #3   Title Increase left ankle strength to 5/5 to increase stability for functional tasks   Time 8   Period Weeks   Status On-going     PT LONG TERM GOAL #4   Title Perform ADL's with pain not > 2-3/10.   Time 8   Period Weeks   Status Achieved     PT LONG TERM GOAL #5   Title No episodes of left ankle instability.   Time 8   Period Weeks   Status Achieved               Plan - 10/26/16 1513    Clinical Impression Statement Patient tolerated treatment well today. Patient did not have ankle brace on today. Patient did well with standing SLS and ankle strengthening activities. Patient has not reported another episode of rolling her ankle and doing better overall. Patient progressing toward strength goal yet ongoing ue to ongoing weakness esp with eversion.    Rehab Potential Excellent   PT Frequency 2x / week   PT Duration 6 weeks   PT Treatment/Interventions Cryotherapy;Electrical Stimulation;Moist Heat;Ultrasound;Patient/family education;Neuromuscular re-education;Therapeutic exercise;Therapeutic activities;Manual techniques;Passive range of motion   PT Next Visit Plan cont with POC and progress with strengthening / ankle stabilization   Consulted and Agree with Plan of Care Patient      Patient will benefit from skilled therapeutic intervention in order to improve the following deficits and impairments:  Pain, Decreased activity tolerance, Decreased strength, Decreased range of motion  Visit Diagnosis: Pain in left ankle and joints of left foot  Stiffness of left ankle, not elsewhere classified     Problem List Patient Active Problem List   Diagnosis Date Noted  . Osteopenia 09/05/2016  . Upper respiratory infection 09/05/2016  . Chest pain 03/04/2016  . Degenerative arthritis of right knee 05/09/2014  . Headache(784.0) 07/19/2013  . Tinnitus 07/19/2013  . Dry skin dermatitis 08/31/2012   . Left hip pain 08/31/2012  . Cerumen impaction 08/31/2012  . Well adult exam 05/25/2012  . Upper abdominal pain-chronic 04/02/2011  . Thumb pain 03/11/2011  . Donor, blood 03/11/2011  . Shoulder pain 03/11/2011  . OSTEOARTHRITIS 09/10/2010  . VERTIGO 03/05/2010  . OSA on CPAP 07/22/2008  .  B12 deficiency 08/16/2007  . Vitamin D deficiency 08/16/2007  . 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 SARCOIDOSIS, PULMONARY 05/09/2007  . Hypothyroidism 05/09/2007  . ALLERGIC RHINITIS 05/09/2007  . GERD 05/09/2007  . Personal history of colon polyps 05/09/2007    Phillips Climes, PTA 10/26/2016, 3:29 PM  [facility] [place], [place], [address] Phone: [phone]   Fax:  [phone]  Name: Elizabeth Gray MRN: [id number] Date of Birth: [birth year]

## 2016-10-31 ENCOUNTER — Encounter: Payer: Self-pay | Admitting: Physical Therapy

## 2016-10-31 ENCOUNTER — Ambulatory Visit: Payer: Medicare Other | Attending: Student | Admitting: Physical Therapy

## 2016-10-31 DIAGNOSIS — M25572 Pain in left ankle and joints of left foot: Secondary | ICD-10-CM | POA: Insufficient documentation

## 2016-10-31 DIAGNOSIS — M25672 Stiffness of left ankle, not elsewhere classified: Secondary | ICD-10-CM | POA: Diagnosis not present

## 2016-10-31 NOTE — Therapy (Signed)
[facility] [place], [place], [address] Phone: [phone]   Fax:  [phone]  Physical Therapy Treatment  Patient Details  Name: Elizabeth Gray MRN: [id number] Date of Birth: Oct 18, 1940 Referring Provider: Wylene Simmer MD.  Encounter Date: 10/31/2016      PT End of Session - 10/31/16 1151    Visit Number 16   Number of Visits 24   Date for PT Re-Evaluation 11/04/16   PT Start Time 1115   PT Stop Time 1202   PT Time Calculation (min) 47 min   Activity Tolerance Patient tolerated treatment well   Behavior During Therapy [facility] for tasks assessed/performed      Past Medical History:  Diagnosis Date  . Bursitis   . External hemorrhoids without mention of complication 0160   Colonoscopy   . Fatigue   . GERD (gastroesophageal reflux disease)   . Headache(784.0)    migraines  . Heat rash    under the breasts.Marland Kitchenappeared on monday.Marland KitchenMarland KitchenBurning & itching, uses cortisone  . Hypothyroidism   . Iron deficiency anemia, unspecified   . Lower esophageal ring 2008   EGD  . Nonorganic sleep disorder, unspecified   . Osteoarthritis   . Pancreatitis   . Personal history of colonic polyps   . Polio 1948  . Pulmonary sarcoidosis ([place])   . Sleep apnea    cpap since 09 [facility]  . Vitamin B12 deficiency   . Vitamin D deficiency     Past Surgical History:  Procedure Laterality Date  . APPENDECTOMY  1988  . CARPAL TUNNEL RELEASE  2002   rt  . CHOLECYSTECTOMY  2000  . COLONOSCOPY  12/19/2006   Multiple diminutive polyps destroyed-removed (hyperpastic), internal hemorrhoids  . ESOPHAGOGASTRODUODENOSCOPY  12/19/2006   lower esophageal ring dilated  . HAND SURGERY  1997   left  . KNEE ARTHROSCOPY Bilateral    multiple 2 on lft 1 on rt  . [place] SURGERY  1995  . TOE SURGERY Left    left foot next to little toe  joint rem  . TOTAL KNEE ARTHROPLASTY Right 05/09/2014   Procedure: RIGHT TOTAL KNEE  ARTHROPLASTY;  Surgeon: [name], MD;  Location: [place];  Service: Orthopedics;  Laterality: Right;    There were no vitals filed for this visit.      Subjective Assessment - 10/31/16 1117    Subjective Patient reported some increased pain for unknown reason, did a lot of walking yet wore her brace   Limitations Walking   How long can you walk comfortably? Short community distances.   Patient Stated Goals Get out of pain.   Currently in Pain? Yes   Pain Score 2    Pain Location Ankle   Pain Orientation Left   Pain Descriptors / Indicators --  "twinge"   Pain Type Chronic pain   Pain Onset More than a month ago   Pain Frequency Intermittent   Aggravating Factors  prolong activity   Pain Relieving Factors at rest                         [facility] Adult PT Treatment/Exercise - 10/31/16 0001      Knee/Hip Exercises: Aerobic   Nustep Level 5 x 10 minutes.     Acupuncturist Location left lateral ankle.   Water quality scientist Parameters 80-150hz  x55min   Printmaker Goals  Pain     Vasopneumatic   Number Minutes Vasopneumatic  15 minutes   Vasopnuematic Location  Ankle   Vasopneumatic Pressure Medium     Ankle Exercises: Standing   SLS L LE balance on airex with right on 2" step for assist x47min   Rocker Board 3 minutes     Ankle Exercises: Seated   Other Seated Ankle Exercises L ankle isolator 1 1/2 # DF and circles x20 reps each, inv/ever 1/2# 2x10 each     Ankle Exercises: Supine   T-Band IR/ER with yellow t-band x20 each                  PT Short Term Goals - 09/05/16 1210      PT SHORT TERM GOAL #1   Title STG's=LTG's.           PT Long Term Goals - 10/10/16 1142      PT LONG TERM GOAL #1   Title independent with a HEP.   Time 8   Period Weeks   Status Achieved     PT LONG TERM GOAL #2   Title Increase left ankle dorsiflexion to 10  degrees to normalize the patient's gait pattern   Time 8   Period Weeks   Status Achieved  AROM 12 degrees 10/10/16     PT LONG TERM GOAL #3   Title Increase left ankle strength to 5/5 to increase stability for functional tasks   Time 8   Period Weeks   Status On-going     PT LONG TERM GOAL #4   Title Perform ADL's with pain not > 2-3/10.   Time 8   Period Weeks   Status Achieved     PT LONG TERM GOAL #5   Title No episodes of left ankle instability.   Time 8   Period Weeks   Status Achieved               Plan - 10/31/16 1152    Clinical Impression Statement Patient tolerated treatment well today and wore her brace. Patient has reported an increase of frequency of pain for unknown reason. Patient did well today and progressing with standing exercises wearing ankle stabilizer. Patient progressing yet strength goal ongoing due to defict.    Rehab Potential Excellent   PT Frequency 2x / week   PT Duration 6 weeks   PT Treatment/Interventions Cryotherapy;Electrical Stimulation;Moist Heat;Ultrasound;Patient/family education;Neuromuscular re-education;Therapeutic exercise;Therapeutic activities;Manual techniques;Passive range of motion   PT Next Visit Plan cont with POC and progress with strengthening / ankle stabilization (MD note next visit to Dr. Doran Durand)   Consulted and Agree with Plan of Care Patient      Patient will benefit from skilled therapeutic intervention in order to improve the following deficits and impairments:  Pain, Decreased activity tolerance, Decreased strength, Decreased range of motion  Visit Diagnosis: Pain in left ankle and joints of left foot  Stiffness of left ankle, not elsewhere classified     Problem List Patient Active Problem List   Diagnosis Date Noted  . Osteopenia 09/05/2016  . Upper respiratory infection 09/05/2016  . Chest pain 03/04/2016  . Degenerative arthritis of right knee 05/09/2014  . Headache(784.0) 07/19/2013  .  Tinnitus 07/19/2013  . Dry skin dermatitis 08/31/2012  . Left hip pain 08/31/2012  . Cerumen impaction 08/31/2012  . Well adult exam 05/25/2012  . Upper abdominal pain-chronic 04/02/2011  . Thumb pain 03/11/2011  . Donor, blood 03/11/2011  . Shoulder pain 03/11/2011  .  OSTEOARTHRITIS 09/10/2010  . VERTIGO 03/05/2010  . OSA on CPAP 07/22/2008  . B12 deficiency 08/16/2007  . Vitamin D deficiency 08/16/2007  . 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 SARCOIDOSIS, PULMONARY 05/09/2007  . Hypothyroidism 05/09/2007  . ALLERGIC RHINITIS 05/09/2007  . GERD 05/09/2007  . Personal history of colon polyps 05/09/2007    Phillips Climes, PTA 10/31/2016, 12:03 PM  [facility] [address] [place], [place], [address] Phone: [phone]   Fax:  [phone]  Name: Elizabeth Gray MRN: [id number] Date of Birth: 04/11/1941

## 2016-11-01 ENCOUNTER — Encounter: Payer: Self-pay | Admitting: Pulmonary Disease

## 2016-11-02 ENCOUNTER — Encounter: Payer: Self-pay | Admitting: Physician Assistant

## 2016-11-02 MED ORDER — OMEPRAZOLE-SODIUM BICARBONATE 40-1100 MG PO CAPS: 1.0000 | ORAL_CAPSULE | Freq: Every day | ORAL | 3 refills | Status: DC

## 2016-11-04 ENCOUNTER — Ambulatory Visit: Payer: Medicare Other | Admitting: Physical Therapy

## 2016-11-04 ENCOUNTER — Encounter: Payer: Self-pay | Admitting: Physical Therapy

## 2016-11-04 DIAGNOSIS — M25572 Pain in left ankle and joints of left foot: Secondary | ICD-10-CM

## 2016-11-04 DIAGNOSIS — M25672 Stiffness of left ankle, not elsewhere classified: Secondary | ICD-10-CM

## 2016-11-04 NOTE — Therapy (Addendum)
[facility] [place], [place], [address] Phone: [phone]   Fax:  [phone]  Physical Therapy Treatment  Patient Details  Name: Elizabeth Gray MRN: [id number] Date of Birth: 03/10/1941 Referring Provider: Wylene Simmer MD.  Encounter Date: 11/04/2016      PT End of Session - 11/04/16 0737    Visit Number 17   Number of Visits 24   Date for PT Re-Evaluation 11/04/16   PT Start Time 0734   PT Stop Time 0824   PT Time Calculation (min) 50 min   Activity Tolerance Patient tolerated treatment well   Behavior During Therapy [facility] for tasks assessed/performed      Past Medical History:  Diagnosis Date  . Bursitis   . External hemorrhoids without mention of complication 9758   Colonoscopy   . Fatigue   . GERD (gastroesophageal reflux disease)   . Headache(784.0)    migraines  . Heat rash    under the breasts.Marland Kitchenappeared on monday.Marland KitchenMarland KitchenBurning & itching, uses cortisone  . Hypothyroidism   . Iron deficiency anemia, unspecified   . Lower esophageal ring 2008   EGD  . Nonorganic sleep disorder, unspecified   . Osteoarthritis   . Pancreatitis   . Personal history of colonic polyps   . Polio 1948  . Pulmonary sarcoidosis ([place])   . Sleep apnea    cpap since 09 [facility]  . Vitamin B12 deficiency   . Vitamin D deficiency     Past Surgical History:  Procedure Laterality Date  . APPENDECTOMY  1988  . CARPAL TUNNEL RELEASE  2002   rt  . CHOLECYSTECTOMY  2000  . COLONOSCOPY  12/19/2006   Multiple diminutive polyps destroyed-removed (hyperpastic), internal hemorrhoids  . ESOPHAGOGASTRODUODENOSCOPY  12/19/2006   lower esophageal ring dilated  . HAND SURGERY  1997   left  . KNEE ARTHROSCOPY Bilateral    multiple 2 on lft 1 on rt  . [place] SURGERY  1995  . TOE SURGERY Left    left foot next to little toe  joint rem  . TOTAL KNEE ARTHROPLASTY Right 05/09/2014   Procedure: RIGHT TOTAL KNEE  ARTHROPLASTY;  Surgeon: [name], MD;  Location: [place];  Service: Orthopedics;  Laterality: Right;    There were no vitals filed for this visit.      Subjective Assessment - 11/04/16 0734    Subjective Reports that her foot feels better but still having twinges of pain but nothing major.   Limitations Walking   How long can you walk comfortably? Short community distances.   Patient Stated Goals Get out of pain.   Currently in Pain? Other (Comment)  "Just a twinge, just letting me know its there"            [facility] PT Assessment - 11/04/16 0001      Assessment   Medical Diagnosis Left ankle sprain.   Next MD Visit 11/07/2016     Restrictions   Weight Bearing Restrictions No     ROM / Strength   AROM / PROM / Strength Strength     Strength   Overall Strength Within functional limits for tasks performed   Strength Assessment Site Ankle   Right/Left Ankle Left   Left Ankle Dorsiflexion 5/5   Left Ankle Inversion 5/5   Left Ankle Eversion 5/5                     [facility] Adult PT  Treatment/Exercise - 11/04/16 0001      Knee/Hip Exercises: Aerobic   Nustep Level 5 x 10 minutes.     Knee/Hip Exercises: Standing   Forward Step Up Left;2 sets;10 reps;Hand Hold: 2;Step Height: 6"   Rocker Board 5 minutes     Modalities   Modalities Passenger transport manager Location L lateral ankle   Electrical Stimulation Action Pre-Mod   Electrical Stimulation Parameters 80-150 hz x15 min   Electrical Stimulation Goals Pain     Vasopneumatic   Number Minutes Vasopneumatic  15 minutes   Vasopnuematic Location  Ankle   Vasopneumatic Pressure Medium   Vasopneumatic Temperature  34     Ankle Exercises: Seated   Other Seated Ankle Exercises L ankle isolator 1.5# ABCs x1 rep, squares and triangles x20 reps             Balance Exercises - 11/04/16 0800      Balance Exercises: Standing   SLS Eyes  open;Foam/compliant surface  x1 max trial   Standing, One Foot on a Step Eyes open;Foam/compliant surface;6 inch  x3 min             PT Short Term Goals - 09/05/16 1210      PT SHORT TERM GOAL #1   Title STG's=LTG's.           PT Long Term Goals - 11/04/16 3419      PT LONG TERM GOAL #1   Title independent with a HEP.   Time 8   Period Weeks   Status Achieved     PT LONG TERM GOAL #2   Title Increase left ankle dorsiflexion to 10 degrees to normalize the patient's gait pattern   Time 8   Period Weeks   Status Achieved  AROM 12 degrees 10/10/16     PT LONG TERM GOAL #3   Title Increase left ankle strength to 5/5 to increase stability for functional tasks   Time 8   Period Weeks   Status Achieved  L ankle MMT DF/Inv/Ev 5/5 as of 11/04/2016     PT LONG TERM GOAL #4   Title Perform ADL's with pain not > 2-3/10.   Time 8   Period Weeks   Status Achieved     PT LONG TERM GOAL #5   Title No episodes of left ankle instability.   Time 8   Period Weeks   Status Achieved               Plan - 11/04/16 6222    Clinical Impression Statement Patient presented in clinic with reports of minimal "twinges" of discomfort in L anteriolateral ankle. Patient able to be guided through L ankle strengthening exercises and strength assessment without pain except a stretch noted rockerboard in affected area. L ankle strength 5/5 with DF/Inv/Ev today thus meeting goal. Patient began noting more discomfort with balance activities but more so with SLS on airex per patient report but pain disipated upon walking to plinth table following balance activities. Normal modalities response noted following removal of the modalities with no increased edema observed along L lateral ankle.   Rehab Potential Excellent   PT Frequency 2x / week   PT Duration 6 weeks   PT Treatment/Interventions Cryotherapy;Electrical Stimulation;Moist Heat;Ultrasound;Patient/family education;Neuromuscular  re-education;Therapeutic exercise;Therapeutic activities;Manual techniques;Passive range of motion   PT Next Visit Plan MD note sent to Dr. Doran Durand for patient's appointment 11/07/2016.   Consulted and Agree with Plan of  Care Patient      Patient will benefit from skilled therapeutic intervention in order to improve the following deficits and impairments:  Pain, Decreased activity tolerance, Decreased strength, Decreased range of motion  Visit Diagnosis: Pain in left ankle and joints of left foot  Stiffness of left ankle, not elsewhere classified     Problem List Patient Active Problem List   Diagnosis Date Noted  . Osteopenia 09/05/2016  . Upper respiratory infection 09/05/2016  . Chest pain 03/04/2016  . Degenerative arthritis of right knee 05/09/2014  . Headache(784.0) 07/19/2013  . Tinnitus 07/19/2013  . Dry skin dermatitis 08/31/2012  . Left hip pain 08/31/2012  . Cerumen impaction 08/31/2012  . Well adult exam 05/25/2012  . Upper abdominal pain-chronic 04/02/2011  . Thumb pain 03/11/2011  . Donor, blood 03/11/2011  . Shoulder pain 03/11/2011  . OSTEOARTHRITIS 09/10/2010  . VERTIGO 03/05/2010  . OSA on CPAP 07/22/2008  . B12 deficiency 08/16/2007  . Vitamin D deficiency 08/16/2007  . 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 SARCOIDOSIS, PULMONARY 05/09/2007  . Hypothyroidism 05/09/2007  . ALLERGIC RHINITIS 05/09/2007  . GERD 05/09/2007  . Personal history of colon polyps 05/09/2007    Wynelle Fanny, PTA 11/04/2016, 8:26 AM  [facility] [address] [place], [place], [address] Phone: [phone]   Fax:  [phone]  Name: Elizabeth Gray MRN: [id number] Date of Birth: April 15, 1941  PHYSICAL THERAPY DISCHARGE SUMMARY  Visits from Start of Care: 17.  Current functional level related to goals / functional outcomes: See above.   Remaining deficits: All goals met.   Education / Equipment: HEP. Plan: Patient  agrees to discharge.  Patient goals were met. Patient is being discharged due to meeting the stated rehab goals.  ?????         Mali Applegate MPT

## 2016-11-07 DIAGNOSIS — S93492D Sprain of other ligament of left ankle, subsequent encounter: Secondary | ICD-10-CM | POA: Diagnosis not present

## 2016-11-07 DIAGNOSIS — Q688 Other specified congenital musculoskeletal deformities: Secondary | ICD-10-CM | POA: Diagnosis not present

## 2016-11-09 ENCOUNTER — Ambulatory Visit (AMBULATORY_SURGERY_CENTER): Payer: Self-pay

## 2016-11-09 VITALS — Ht 63.5 in | Wt 168.8 lb

## 2016-11-09 DIAGNOSIS — K222 Esophageal obstruction: Secondary | ICD-10-CM

## 2016-11-09 NOTE — Progress Notes (Signed)
No allergies to eggs or soy No diet meds No home oxygen No past problems with anesthesia but does have [place]  Registered emmi

## 2016-11-16 ENCOUNTER — Encounter: Payer: Self-pay | Admitting: Internal Medicine

## 2016-11-17 ENCOUNTER — Encounter: Payer: Self-pay | Admitting: Pulmonary Disease

## 2016-11-17 NOTE — Telephone Encounter (Signed)
Pt insurance is not covering the Qvar Alternatives are Arnuity and Flovent Please advise Dr Elsworth Soho. Thanks.

## 2016-11-18 ENCOUNTER — Ambulatory Visit (AMBULATORY_SURGERY_CENTER): Payer: Medicare Other | Admitting: Internal Medicine

## 2016-11-18 ENCOUNTER — Other Ambulatory Visit: Payer: Self-pay

## 2016-11-18 ENCOUNTER — Encounter: Payer: Self-pay | Admitting: Internal Medicine

## 2016-11-18 VITALS — BP 131/69 | HR 65 | Temp 97.5°F | Resp 19 | Ht 63.0 in | Wt 175.0 lb

## 2016-11-18 DIAGNOSIS — K317 Polyp of stomach and duodenum: Secondary | ICD-10-CM

## 2016-11-18 DIAGNOSIS — K222 Esophageal obstruction: Secondary | ICD-10-CM | POA: Diagnosis not present

## 2016-11-18 DIAGNOSIS — D869 Sarcoidosis, unspecified: Secondary | ICD-10-CM | POA: Diagnosis not present

## 2016-11-18 DIAGNOSIS — K2950 Unspecified chronic gastritis without bleeding: Secondary | ICD-10-CM | POA: Diagnosis not present

## 2016-11-18 DIAGNOSIS — K259 Gastric ulcer, unspecified as acute or chronic, without hemorrhage or perforation: Secondary | ICD-10-CM | POA: Diagnosis not present

## 2016-11-18 DIAGNOSIS — K219 Gastro-esophageal reflux disease without esophagitis: Secondary | ICD-10-CM | POA: Diagnosis not present

## 2016-11-18 DIAGNOSIS — G4733 Obstructive sleep apnea (adult) (pediatric): Secondary | ICD-10-CM | POA: Diagnosis not present

## 2016-11-18 DIAGNOSIS — R1310 Dysphagia, unspecified: Secondary | ICD-10-CM

## 2016-11-18 DIAGNOSIS — K3189 Other diseases of stomach and duodenum: Secondary | ICD-10-CM

## 2016-11-18 MED ORDER — SODIUM CHLORIDE 0.9 % IV SOLN: 500.0000 mL | INTRAVENOUS | Status: DC

## 2016-11-18 MED ORDER — FLUTICASONE PROPIONATE HFA 110 MCG/ACT IN AERO: 1.0000 | INHALATION_SPRAY | Freq: Two times a day (BID) | RESPIRATORY_TRACT | 5 refills | Status: DC

## 2016-11-18 NOTE — Progress Notes (Signed)
Report to PACU, RN, vss, BBS= Clear.  

## 2016-11-18 NOTE — Op Note (Signed)
[place] Patient Name: Elizabeth Gray Procedure Date: 11/18/2016 9:53 AM MRN: [id number] Endoscopist: Gatha Mayer , MD Age: 76 Referring MD:  Date of Birth: [birth year] Gender: Female Account #: [id number] Procedure:                Upper GI endoscopy Indications:              Dysphagia, Stricture of the esophagus Medicines:                Propofol per Anesthesia, Monitored Anesthesia Care Procedure:                Pre-Anesthesia Assessment:                           - Prior to the procedure, a History and Physical                            was performed, and patient medications and                            allergies were reviewed. The patient's tolerance of                            previous anesthesia was also reviewed. The risks                            and benefits of the procedure and the sedation                            options and risks were discussed with the patient.                            All questions were answered, and informed consent                            was obtained. Prior Anticoagulants: The patient                            last took aspirin 1 day prior to the procedure. ASA                            Grade Assessment: III - A patient with severe                            systemic disease. After reviewing the risks and                            benefits, the patient was deemed in satisfactory                            condition to undergo the procedure.                           After obtaining informed consent, the endoscope was  passed under direct vision. Throughout the                            procedure, the patient's blood pressure, pulse, and                            oxygen saturations were monitored continuously. The                            Model GIF-HQ190 [id number]) scope was introduced                            through the mouth, and advanced to the second part    of duodenum. The upper GI endoscopy was                            accomplished without difficulty. The patient                            tolerated the procedure well. Scope In: Scope Out: Findings:                 The examined esophagus was moderately tortuous.                           A TTS dilator was passed through the scope.                            Dilation with an 18-19-20 mm balloon dilator was                            performed to 20 mm in the distal esophagus.                            Estimated blood loss: none.                           One 3 mm sessile polyp with no bleeding and no                            stigmata of recent bleeding was found in the                            gastric antrum. Biopsies were taken with a cold                            forceps for histology. Verification of patient                            identification for the specimen was done. Estimated                            blood loss was minimal.  Localized mild mucosal changes characterized by                            discoloration were found in the prepyloric region                            of the stomach. Biopsies were taken with a cold                            forceps for histology. Verification of patient                            identification for the specimen was done. Estimated                            blood loss was minimal.                           A small hiatal hernia was present.                           The exam was otherwise without abnormality.                           The cardia and gastric fundus were normal on                            retroflexion. Complications:            No immediate complications. Estimated Blood Loss:     Estimated blood loss was minimal. Impression:               - Tortuous esophagus.                           - One gastric polyp. Biopsied.                           - Discolored mucosa in the prepyloric  region of the                            stomach. Biopsied. ? intestinal metaplasia                           - Small hiatal hernia. 1-2 cm                           - The examination was otherwise normal.                           - Dilation performed in the distal esophagus. at [employer] where ring was seen on Ba swallow Recommendation:           - Patient has a contact number available for  emergencies. The signs and symptoms of potential                            delayed complications were discussed with the                            patient. Return to normal activities tomorrow.                            Written discharge instructions were provided to the                            patient.                           - Clear liquids x 1 hour then soft foods rest of                            day. Start prior diet tomorrow.                           - Continue present medications.                           - Await pathology results.                           - Dysphagia 3 if dilation does not resolve things -                            esophageal dysmotility could be main issue. Gatha Mayer, MD 11/18/2016 10:47:56 AM This report has been signed electronically.

## 2016-11-18 NOTE — Patient Instructions (Addendum)
I dilated the esophagus - I hope that makes a difference. Some of the problem is poor motility or squeezing of the esophagus which cannot really be fixed - it will require chewing food in small pieces.  I am including a diet sheet with tips for that if needed.  I also took some biopsies of a stomach polyp and some color changes of the stomach lining - probably ok but want to check on these spots.  I appreciate the opportunity to care for you. Gatha Mayer, MD, FACG  YOU HAD AN ENDOSCOPIC PROCEDURE TODAY AT [facility]:   Refer to the procedure report that was given to you for any specific questions about what was found during the examination.  If the procedure report does not answer your questions, please call your gastroenterologist to clarify.  If you requested that your care partner not be given the details of your procedure findings, then the procedure report has been included in a sealed envelope for you to review at your convenience later.  YOU SHOULD EXPECT: Some feelings of bloating in the abdomen. Passage of more gas than usual.  Walking can help get rid of the air that was put into your GI tract during the procedure and reduce the bloating. If you had a lower endoscopy (such as a colonoscopy or flexible sigmoidoscopy) you may notice spotting of blood in your stool or on the toilet paper. If you underwent a bowel prep for your procedure, you may not have a normal bowel movement for a few days.  Please Note:  You might notice some irritation and congestion in your nose or some drainage.  This is from the oxygen used during your procedure.  There is no need for concern and it should clear up in a day or so.  SYMPTOMS TO REPORT IMMEDIATELY:   Following upper endoscopy (EGD)  Vomiting of blood or coffee ground material  New chest pain or pain under the shoulder blades  Painful or persistently difficult swallowing  New shortness of breath  Fever of 100F or  higher  Black, tarry-looking stools  For urgent or emergent issues, a gastroenterologist can be reached at any hour by calling [phone].   DIET:  We do recommend a small meal at first, but then you may proceed to your regular diet.  Drink plenty of fluids but you should avoid alcoholic beverages for 24 hours.  ACTIVITY:  You should plan to take it easy for the rest of today and you should NOT DRIVE or use heavy machinery until tomorrow (because of the sedation medicines used during the test).    FOLLOW UP: Our staff will call the number listed on your records the next business day following your procedure to check on you and address any questions or concerns that you may have regarding the information given to you following your procedure. If we do not reach you, we will leave a message.  However, if you are feeling well and you are not experiencing any problems, there is no need to return our call.  We will assume that you have returned to your regular daily activities without incident.  If any biopsies were taken you will be contacted by phone or by letter within the next 1-3 weeks.  Please call us at [phone] if you have not heard about the biopsies in 3 weeks.    SIGNATURES/CONFIDENTIALITY: You and/or your care partner have signed paperwork which will be entered into your  electronic medical record.  These signatures attest to the fact that that the information above on your After Visit Summary has been reviewed and is understood.  Full responsibility of the confidentiality of this discharge information lies with you and/or your care-partner.

## 2016-11-18 NOTE — Telephone Encounter (Signed)
Flovent 110 one puff twice daily x 5 refills Rinse mouth after use

## 2016-11-21 ENCOUNTER — Telehealth: Payer: Self-pay

## 2016-11-21 NOTE — Telephone Encounter (Signed)
  Follow up Call-  Call back number 11/18/2016  Post procedure Call Back phone  # [phone]  Permission to leave phone message Yes  Some recent data might be hidden     Patient questions:  Do you have a fever, pain , or abdominal swelling? No. Pain Score  0 *  Have you tolerated food without any problems? Yes.    Have you been able to return to your normal activities? Yes.    Do you have any questions about your discharge instructions: Diet   No. Medications  No. Follow up visit  No.  Do you have questions or concerns about your Care? No.  Actions: * If pain score is 4 or above: No action needed, pain <4.

## 2016-11-24 ENCOUNTER — Encounter: Payer: Self-pay | Admitting: Internal Medicine

## 2016-11-24 NOTE — Progress Notes (Signed)
Biopsies were ok - some inflammation and a change in the stomach lining  Please get a symptom update from her re: dysphagia, regurgitation, vomiting  No letter/recall

## 2016-11-25 NOTE — Progress Notes (Signed)
OK Let's refill her Zegerid or other PPI and she should take that qd and f/u prn

## 2016-12-23 ENCOUNTER — Other Ambulatory Visit: Payer: Self-pay | Admitting: Internal Medicine

## 2016-12-23 DIAGNOSIS — E034 Atrophy of thyroid (acquired): Secondary | ICD-10-CM

## 2016-12-23 DIAGNOSIS — Z0000 Encounter for general adult medical examination without abnormal findings: Secondary | ICD-10-CM

## 2016-12-23 DIAGNOSIS — E559 Vitamin D deficiency, unspecified: Secondary | ICD-10-CM

## 2016-12-28 NOTE — Telephone Encounter (Signed)
Called refill into [facility] had to leave on pharmacy vm../lmb 

## 2017-02-03 DIAGNOSIS — H04123 Dry eye syndrome of bilateral lacrimal glands: Secondary | ICD-10-CM | POA: Diagnosis not present

## 2017-02-03 DIAGNOSIS — H40003 Preglaucoma, unspecified, bilateral: Secondary | ICD-10-CM | POA: Diagnosis not present

## 2017-02-14 DIAGNOSIS — M79672 Pain in left foot: Secondary | ICD-10-CM | POA: Diagnosis not present

## 2017-02-20 DIAGNOSIS — D485 Neoplasm of uncertain behavior of skin: Secondary | ICD-10-CM | POA: Diagnosis not present

## 2017-02-20 DIAGNOSIS — L820 Inflamed seborrheic keratosis: Secondary | ICD-10-CM | POA: Diagnosis not present

## 2017-02-20 DIAGNOSIS — D0339 Melanoma in situ of other parts of face: Secondary | ICD-10-CM | POA: Diagnosis not present

## 2017-02-26 ENCOUNTER — Encounter: Payer: Self-pay | Admitting: Pulmonary Disease

## 2017-02-27 ENCOUNTER — Ambulatory Visit (INDEPENDENT_AMBULATORY_CARE_PROVIDER_SITE_OTHER): Payer: Medicare Other | Admitting: Pulmonary Disease

## 2017-02-27 ENCOUNTER — Encounter: Payer: Self-pay | Admitting: Pulmonary Disease

## 2017-02-27 DIAGNOSIS — D869 Sarcoidosis, unspecified: Secondary | ICD-10-CM | POA: Diagnosis not present

## 2017-02-27 DIAGNOSIS — L814 Other melanin hyperpigmentation: Secondary | ICD-10-CM | POA: Diagnosis not present

## 2017-02-27 DIAGNOSIS — Z9989 Dependence on other enabling machines and devices: Secondary | ICD-10-CM

## 2017-02-27 DIAGNOSIS — G4733 Obstructive sleep apnea (adult) (pediatric): Secondary | ICD-10-CM | POA: Diagnosis not present

## 2017-02-27 DIAGNOSIS — D034 Melanoma in situ of scalp and neck: Secondary | ICD-10-CM | POA: Diagnosis not present

## 2017-02-27 DIAGNOSIS — D0339 Melanoma in situ of other parts of face: Secondary | ICD-10-CM | POA: Diagnosis not present

## 2017-02-27 NOTE — Assessment & Plan Note (Signed)
continue on Flovent  -does not seem to be active at this time

## 2017-02-27 NOTE — Patient Instructions (Signed)
CPAP is working well Call us for any issues

## 2017-02-27 NOTE — Progress Notes (Signed)
   Subjective:    Patient ID: BARBARAJEAN KINZLER, female    DOB: June 06, 1941, 76 y.o.   MRN: [id number]  HPI  76/F for FU of sarcoid & OSA -on cpap 12cmh20  Chief Complaint  Patient presents with  . Follow-up    12 mo f/u for OSA. States breathing and OSA have been ok since last visit. Uses [facility] as DME.     Annual FU No coughing or breathing issues. She had a melanoma on her face that was excised by dermatology. QR was changed to Flovent due to insurance issues  CPAP is working well. No vascular pressure issues. She has been getting her supplies on time. Husband is 47 and is getting on a CPAP machine due to pulmonary hypertension, new diagnosis. CPAP is really helped with her daytime somnolence and fatigue. Download on 12 cm shows good control of events and excellent compliance with minimal leak  Chest x-ray 2017 no infiltrates    Significant tests/ events  PSG 06/2008 showed predominantly REM related obstructive sleep apnea , correctd by CPAP 12 cm. CT 2008  Subtle upper lobe predominant perilymphatic nodularity with mildly prominent mediastinal and bihilar lymphoid tissue as well as gastrohepatic ligament lymph node. Question sarcoid. Nodular splenic enhancement pattern  05/2012 RAST neg    Review of Systems neg for any significant sore throat, dysphagia, itching, sneezing, nasal congestion or excess/ purulent secretions, fever, chills, sweats, unintended wt loss, pleuritic or exertional cp, hempoptysis, orthopnea pnd or change in chronic leg swelling. Also denies presyncope, palpitations, heartburn, abdominal pain, nausea, vomiting, diarrhea or change in bowel or urinary habits, dysuria,hematuria, rash, arthralgias, visual complaints, headache, numbness weakness or ataxia.     Objective:   Physical Exam   Gen. Pleasant, well-nourished, in no distress ENT - no thrush, no post nasal drip Neck: No JVD, no thyromegaly, no carotid bruits Lungs: no use of accessory muscles,  no dullness to percussion, clear without rales or rhonchi  Cardiovascular: Rhythm regular, heart sounds  normal, no murmurs or gallops, no peripheral edema Musculoskeletal: No deformities, no cyanosis or clubbing         Assessment & Plan:

## 2017-02-27 NOTE — Assessment & Plan Note (Signed)
compliance with goal of at least 4-6 hrs every night is the expectation. Advised against medications with sedative side effects Cautioned against driving when sleepy - understanding that sleepiness will vary on a day to day basis  

## 2017-02-28 ENCOUNTER — Encounter: Payer: Self-pay | Admitting: Pulmonary Disease

## 2017-02-28 DIAGNOSIS — L814 Other melanin hyperpigmentation: Secondary | ICD-10-CM | POA: Diagnosis not present

## 2017-02-28 DIAGNOSIS — D034 Melanoma in situ of scalp and neck: Secondary | ICD-10-CM | POA: Diagnosis not present

## 2017-02-28 DIAGNOSIS — D0339 Melanoma in situ of other parts of face: Secondary | ICD-10-CM | POA: Diagnosis not present

## 2017-02-28 NOTE — Telephone Encounter (Signed)
When I went to [facility] with my husband yesterday for his CPAP machine, I asked about mine.  They can replace machines after 5 years.  After 10 years, a new sleep study is required.  My machine is 78 years old and they suggested that I get it replaced now.  Could you please send an order for a replacement machine?      RA please advise if we can order a new cpap for this pt.  Thanks

## 2017-02-28 NOTE — Telephone Encounter (Signed)
Okay to send prescription for CPAP 12 cm, download in 4 weeks Office visit with NP in 6 weeks

## 2017-03-01 ENCOUNTER — Other Ambulatory Visit: Payer: Self-pay

## 2017-03-01 DIAGNOSIS — G4733 Obstructive sleep apnea (adult) (pediatric): Secondary | ICD-10-CM

## 2017-03-01 DIAGNOSIS — D0339 Melanoma in situ of other parts of face: Secondary | ICD-10-CM | POA: Diagnosis not present

## 2017-03-01 DIAGNOSIS — L988 Other specified disorders of the skin and subcutaneous tissue: Secondary | ICD-10-CM | POA: Diagnosis not present

## 2017-03-02 DIAGNOSIS — D0339 Melanoma in situ of other parts of face: Secondary | ICD-10-CM | POA: Diagnosis not present

## 2017-03-04 ENCOUNTER — Other Ambulatory Visit: Payer: Self-pay | Admitting: Internal Medicine

## 2017-03-04 DIAGNOSIS — E034 Atrophy of thyroid (acquired): Secondary | ICD-10-CM

## 2017-03-04 DIAGNOSIS — Z0000 Encounter for general adult medical examination without abnormal findings: Secondary | ICD-10-CM

## 2017-03-04 DIAGNOSIS — E559 Vitamin D deficiency, unspecified: Secondary | ICD-10-CM

## 2017-03-06 NOTE — Telephone Encounter (Signed)
Routing to dr plotnikov, please advise, thanks 

## 2017-03-07 NOTE — Telephone Encounter (Signed)
[facility], left voicemail

## 2017-03-19 ENCOUNTER — Other Ambulatory Visit: Payer: Self-pay | Admitting: Internal Medicine

## 2017-03-22 DIAGNOSIS — C801 Malignant (primary) neoplasm, unspecified: Secondary | ICD-10-CM

## 2017-03-22 HISTORY — DX: Malignant (primary) neoplasm, unspecified: C80.1

## 2017-05-01 ENCOUNTER — Other Ambulatory Visit: Payer: Self-pay | Admitting: Internal Medicine

## 2017-05-08 DIAGNOSIS — Z23 Encounter for immunization: Secondary | ICD-10-CM | POA: Diagnosis not present

## 2017-05-10 ENCOUNTER — Encounter: Payer: Self-pay | Admitting: Internal Medicine

## 2017-05-12 ENCOUNTER — Ambulatory Visit: Payer: Medicare Other | Admitting: Diagnostic Neuroimaging

## 2017-06-26 ENCOUNTER — Ambulatory Visit: Payer: Medicare Other | Admitting: Diagnostic Neuroimaging

## 2017-06-27 ENCOUNTER — Encounter: Payer: Self-pay | Admitting: Diagnostic Neuroimaging

## 2017-07-17 ENCOUNTER — Other Ambulatory Visit: Payer: Self-pay | Admitting: Diagnostic Neuroimaging

## 2017-07-17 ENCOUNTER — Other Ambulatory Visit: Payer: Self-pay | Admitting: Internal Medicine

## 2017-07-17 DIAGNOSIS — Z0000 Encounter for general adult medical examination without abnormal findings: Secondary | ICD-10-CM

## 2017-07-17 DIAGNOSIS — E559 Vitamin D deficiency, unspecified: Secondary | ICD-10-CM

## 2017-07-17 DIAGNOSIS — E034 Atrophy of thyroid (acquired): Secondary | ICD-10-CM

## 2017-07-17 NOTE — Telephone Encounter (Signed)
Please advise about refill in Dr. Plotnikovs absence. 

## 2017-07-17 NOTE — Telephone Encounter (Signed)
ambien done erx

## 2017-08-14 DIAGNOSIS — D2261 Melanocytic nevi of right upper limb, including shoulder: Secondary | ICD-10-CM | POA: Diagnosis not present

## 2017-08-14 DIAGNOSIS — L603 Nail dystrophy: Secondary | ICD-10-CM | POA: Diagnosis not present

## 2017-08-14 DIAGNOSIS — Z85820 Personal history of malignant melanoma of skin: Secondary | ICD-10-CM | POA: Diagnosis not present

## 2017-08-14 DIAGNOSIS — L738 Other specified follicular disorders: Secondary | ICD-10-CM | POA: Diagnosis not present

## 2017-08-14 DIAGNOSIS — D2262 Melanocytic nevi of left upper limb, including shoulder: Secondary | ICD-10-CM | POA: Diagnosis not present

## 2017-08-14 DIAGNOSIS — L821 Other seborrheic keratosis: Secondary | ICD-10-CM | POA: Diagnosis not present

## 2017-08-16 DIAGNOSIS — M25552 Pain in left hip: Secondary | ICD-10-CM | POA: Diagnosis not present

## 2017-08-16 DIAGNOSIS — M545 Low back pain: Secondary | ICD-10-CM | POA: Diagnosis not present

## 2017-08-16 DIAGNOSIS — M25562 Pain in left knee: Secondary | ICD-10-CM | POA: Diagnosis not present

## 2017-08-23 ENCOUNTER — Other Ambulatory Visit: Payer: Self-pay | Admitting: Internal Medicine

## 2017-08-27 ENCOUNTER — Other Ambulatory Visit: Payer: Self-pay | Admitting: Internal Medicine

## 2017-08-27 DIAGNOSIS — E559 Vitamin D deficiency, unspecified: Secondary | ICD-10-CM

## 2017-08-27 DIAGNOSIS — E034 Atrophy of thyroid (acquired): Secondary | ICD-10-CM

## 2017-08-27 DIAGNOSIS — Z0000 Encounter for general adult medical examination without abnormal findings: Secondary | ICD-10-CM

## 2017-08-28 NOTE — Telephone Encounter (Addendum)
Pt states she has appt on 2/06, and hoping to get the  zolpidem (AMBIEN) 5 MG tablet Filled prior to them.  Pt states [facility] md put her on prednisone, and she is really not sleeping now.  Would like enough to get through to appt please.  [id number] - [place], [place] [phone] (Phone) [phone] (Fax   Pt sees Dr Alain Marion

## 2017-08-29 MED ORDER — ZOLPIDEM TARTRATE 5 MG PO TABS: 5.0000 mg | ORAL_TABLET | Freq: Every day | ORAL | 3 refills | Status: DC

## 2017-08-29 NOTE — Telephone Encounter (Signed)
Notified [facility].../lmb 

## 2017-08-29 NOTE — Telephone Encounter (Signed)
pls advise on refill on zolpidem...Elizabeth Gray

## 2017-08-29 NOTE — Addendum Note (Signed)
Addended by: Earnstine Regal on: 08/29/2017 09:22 AM   Modules accepted: Orders

## 2017-09-06 ENCOUNTER — Ambulatory Visit (INDEPENDENT_AMBULATORY_CARE_PROVIDER_SITE_OTHER): Payer: Medicare Other | Admitting: Internal Medicine

## 2017-09-06 ENCOUNTER — Other Ambulatory Visit (INDEPENDENT_AMBULATORY_CARE_PROVIDER_SITE_OTHER): Payer: Medicare Other

## 2017-09-06 ENCOUNTER — Encounter: Payer: Self-pay | Admitting: Internal Medicine

## 2017-09-06 DIAGNOSIS — E538 Deficiency of other specified B group vitamins: Secondary | ICD-10-CM

## 2017-09-06 DIAGNOSIS — R5383 Other fatigue: Secondary | ICD-10-CM | POA: Diagnosis not present

## 2017-09-06 DIAGNOSIS — Z52008 Unspecified donor, other blood: Secondary | ICD-10-CM

## 2017-09-06 DIAGNOSIS — R208 Other disturbances of skin sensation: Secondary | ICD-10-CM

## 2017-09-06 DIAGNOSIS — N952 Postmenopausal atrophic vaginitis: Secondary | ICD-10-CM | POA: Diagnosis not present

## 2017-09-06 DIAGNOSIS — E034 Atrophy of thyroid (acquired): Secondary | ICD-10-CM | POA: Diagnosis not present

## 2017-09-06 DIAGNOSIS — E559 Vitamin D deficiency, unspecified: Secondary | ICD-10-CM

## 2017-09-06 DIAGNOSIS — Z23 Encounter for immunization: Secondary | ICD-10-CM

## 2017-09-06 LAB — HEPATIC FUNCTION PANEL
ALBUMIN: 4.1 g/dL (ref 3.5–5.2)
ALT: 37 U/L — ABNORMAL HIGH (ref 0–35)
AST: 26 U/L (ref 0–37)
Alkaline Phosphatase: 45 U/L (ref 39–117)
BILIRUBIN TOTAL: 0.4 mg/dL (ref 0.2–1.2)
Bilirubin, Direct: 0.1 mg/dL (ref 0.0–0.3)
Total Protein: 7.2 g/dL (ref 6.0–8.3)

## 2017-09-06 LAB — BASIC METABOLIC PANEL
BUN: 19 mg/dL (ref 6–23)
CHLORIDE: 104 meq/L (ref 96–112)
CO2: 31 meq/L (ref 19–32)
CREATININE: 0.88 mg/dL (ref 0.40–1.20)
Calcium: 9.1 mg/dL (ref 8.4–10.5)
GFR: 65.92 mL/min (ref >60.00)
Glucose, Bld: 84 mg/dL (ref 70–99)
POTASSIUM: 3.9 meq/L (ref 3.5–5.1)
Sodium: 140 mEq/L (ref 135–145)

## 2017-09-06 LAB — CBC WITH DIFFERENTIAL/PLATELET
Basophils Absolute: 0.1 10*3/uL (ref 0.0–0.1)
Basophils Relative: 0.6 % (ref 0.0–3.0)
EOS ABS: 0.3 10*3/uL (ref 0.0–0.7)
EOS PCT: 2.6 % (ref 0.0–5.0)
HEMATOCRIT: 40.5 % (ref 36.0–46.0)
HEMOGLOBIN: 13.4 g/dL (ref 12.0–15.0)
LYMPHS PCT: 38.9 % (ref 12.0–46.0)
Lymphs Abs: 4.3 10*3/uL — ABNORMAL HIGH (ref 0.7–4.0)
MCHC: 33 g/dL (ref 30.0–36.0)
MCV: 88.8 fl (ref 78.0–100.0)
MONO ABS: 1 10*3/uL (ref 0.1–1.0)
Monocytes Relative: 9.2 % (ref 3.0–12.0)
Neutro Abs: 5.4 10*3/uL (ref 1.4–7.7)
Neutrophils Relative %: 48.7 % (ref 43.0–77.0)
Platelets: 319 10*3/uL (ref 150.0–400.0)
RBC: 4.56 Mil/uL (ref 3.87–5.11)
RDW: 14 % (ref 11.5–15.5)
WBC: 11.1 10*3/uL — AB (ref 4.0–10.5)

## 2017-09-06 LAB — LIPID PANEL
Cholesterol: 158 mg/dL (ref 0–200)
HDL: 75.9 mg/dL (ref >39.00)
LDL CALC: 59 mg/dL (ref 0–99)
NONHDL: 82.4
Total CHOL/HDL Ratio: 2
Triglycerides: 118 mg/dL (ref 0.0–149.0)
VLDL: 23.6 mg/dL (ref 0.0–40.0)

## 2017-09-06 LAB — URINALYSIS
Bilirubin Urine: NEGATIVE
HGB URINE DIPSTICK: NEGATIVE
Ketones, ur: NEGATIVE
Leukocytes, UA: NEGATIVE
NITRITE: NEGATIVE
PH: 7.5 (ref 5.0–8.0)
SPECIFIC GRAVITY, URINE: 1.015 (ref 1.000–1.030)
TOTAL PROTEIN, URINE-UPE24: NEGATIVE
URINE GLUCOSE: NEGATIVE
Urobilinogen, UA: 0.2 (ref 0.0–1.0)

## 2017-09-06 LAB — TSH: TSH: 1.65 u[IU]/mL (ref 0.35–4.50)

## 2017-09-06 LAB — VITAMIN D 25 HYDROXY (VIT D DEFICIENCY, FRACTURES): VITD: 55.63 ng/mL (ref 30.00–100.00)

## 2017-09-06 MED ORDER — ZOSTER VAC RECOMB ADJUVANTED 50 MCG/0.5ML IM SUSR: 0.5000 mL | Freq: Once | INTRAMUSCULAR | 1 refills | Status: AC

## 2017-09-06 MED ORDER — ESTROGENS, CONJUGATED 0.625 MG/GM VA CREA: TOPICAL_CREAM | VAGINAL | 11 refills | Status: DC

## 2017-09-06 NOTE — Assessment & Plan Note (Signed)
On Rx Labs 

## 2017-09-06 NOTE — Assessment & Plan Note (Signed)
Premarin vag

## 2017-09-06 NOTE — Assessment & Plan Note (Signed)
D/c Listerin Use [employer] Peroxicare tooth paste Lemon in water

## 2017-09-06 NOTE — Assessment & Plan Note (Signed)
Vit D 

## 2017-09-06 NOTE — Assessment & Plan Note (Signed)
On B12 

## 2017-09-06 NOTE — Progress Notes (Signed)
Subjective:  Patient ID: Elizabeth Gray, female    DOB: [birth year]  Age: 77 y.o. MRN: [id number]  CC: No chief complaint on file.   HPI YUVONNE LANAHAN presents for OA, B12 def, hypothyroidism f/u C/o dryness in vagina C/o burning in the mouth The pt just took steroids for LBP  Outpatient Medications Prior to Visit  Medication Sig Dispense Refill  . Alum Hydroxide-Mag Carbonate (GAVISCON PO) Take 1 tablet by mouth as needed (reflux). As directed as needed    . aspirin 81 MG tablet Take 81 mg by mouth daily.    . Cholecalciferol (VITAMIN D PO) Take 1,000 Units by mouth daily.     . cyanocobalamin (,VITAMIN B-12,) 1000 MCG/ML injection INJECT 1 ML INTO THE MUSCLE EVERY 14 DAYS 10 mL 0  . Flaxseed, Linseed, (FLAXSEED OIL) OIL Take 1 capsule by mouth daily.     . fluticasone (FLOVENT HFA) 110 MCG/ACT inhaler Inhale 1 puff into the lungs 2 (two) times daily. 1 Inhaler 5  . GLUCOSAMINE-CHONDROITIN-MSM-D3 PO Take 1 tablet by mouth 2 (two) times daily.    Marland Kitchen levothyroxine (SYNTHROID, LEVOTHROID) 100 MCG tablet TAKE 1 TABLET BY MOUTH EVERY DAY 90 tablet 1  . magnesium oxide (MAG-OX) 400 MG tablet Take 400 mg by mouth at bedtime.    . Multiple Vitamins-Minerals (ICAPS AREDS FORMULA PO) Take 1 capsule by mouth daily.      Marland Kitchen omeprazole-sodium bicarbonate (ZEGERID) 40-1100 MG capsule TAKE 1 CAPSULE BY MOUTH DAILY BEFORE BREAKFAST. 90 capsule 0  . OVER THE COUNTER MEDICATION Take 5 tablets by mouth 2 (two) times daily. Supplement - [place]    . Pancrelipase, Lip-Prot-Amyl, (CREON) 24000-76000 units CPEP TAKE 1 CAPSULE (24,000 UNITS TOTAL) BY MOUTH 3 (THREE) TIMES DAILY. 270 capsule 11  . Polyethyl Glycol-Propyl Glycol (SYSTANE ULTRA) 0.4-0.3 % SOLN Apply 1 drop to eye as needed (dry eyes).     . topiramate (TOPAMAX) 100 MG tablet TAKE 1 TABLET (100 MG TOTAL) BY MOUTH AT BEDTIME. 90 tablet 3  . traMADol (ULTRAM) 50 MG tablet Take 100 mg by mouth at bedtime.     . vitamin C (ASCORBIC  ACID) 500 MG tablet Take 500 mg by mouth daily.      Marland Kitchen zolpidem (AMBIEN) 5 MG tablet Take 1 tablet (5 mg total) by mouth at bedtime. Patient needs office visit before refills will be given 30 tablet 3   Facility-Administered Medications Prior to Visit  Medication Dose Route Frequency Provider Last Rate Last Dose  . 0.9 %  sodium chloride infusion  500 mL Intravenous Continuous Gatha Mayer, MD        ROS Review of Systems  Constitutional: Negative for activity change, appetite change, chills, fatigue and unexpected weight change.  HENT: Negative for congestion, mouth sores and sinus pressure.   Eyes: Negative for visual disturbance.  Respiratory: Negative for cough and chest tightness.   Gastrointestinal: Negative for abdominal pain and nausea.  Genitourinary: Negative for difficulty urinating, frequency and vaginal pain.  Musculoskeletal: Positive for arthralgias and back pain. Negative for gait problem.  Skin: Negative for pallor and rash.  Neurological: Negative for dizziness, tremors, weakness, numbness and headaches.  Psychiatric/Behavioral: Negative for confusion and sleep disturbance.    Objective:  BP 116/68 (BP Location: Left Arm, Patient Position: Sitting, Cuff Size: Large)   Pulse 63   Temp 97.9 F (36.6 C) (Oral)   Ht 5\' 3"  (1.6 m)   Wt 159 lb (72.1 kg)  SpO2 98%   BMI 28.17 kg/m   BP Readings from Last 3 Encounters:  09/06/17 116/68  02/27/17 114/68  11/18/16 131/69    Wt Readings from Last 3 Encounters:  09/06/17 159 lb (72.1 kg)  02/27/17 167 lb 3.2 oz (75.8 kg)  11/18/16 175 lb (79.4 kg)    Physical Exam  Constitutional: She appears well-developed. No distress.  HENT:  Head: Normocephalic.  Right Ear: External ear normal.  Left Ear: External ear normal.  Nose: Nose normal.  Mouth/Throat: Oropharynx is clear and moist.  Eyes: Conjunctivae are normal. Pupils are equal, round, and reactive to light. Right eye exhibits no discharge. Left eye  exhibits no discharge.  Neck: Normal range of motion. Neck supple. No JVD present. No tracheal deviation present. No thyromegaly present.  Cardiovascular: Normal rate, regular rhythm and normal heart sounds.  Pulmonary/Chest: No stridor. No respiratory distress. She has no wheezes.  Abdominal: Soft. Bowel sounds are normal. She exhibits no distension and no mass. There is no tenderness. There is no rebound and no guarding.  Musculoskeletal: She exhibits no edema or tenderness.  Lymphadenopathy:    She has no cervical adenopathy.  Neurological: She displays normal reflexes. No cranial nerve deficit. She exhibits normal muscle tone. Coordination normal.  Skin: No rash noted. No erythema.  Psychiatric: She has a normal mood and affect. Her behavior is normal. Judgment and thought content normal.    Lab Results  Component Value Date   WBC 6.0 04/01/2016   HGB 12.6 04/01/2016   HCT 37.0 04/01/2016   PLT 347.0 04/01/2016   GLUCOSE 109 (H) 04/01/2016   CHOL 149 04/01/2016   TRIG 126.0 04/01/2016   HDL 60.40 04/01/2016   LDLCALC 63 04/01/2016   ALT 20 04/01/2016   AST 21 04/01/2016   NA 142 04/01/2016   K 4.1 04/01/2016   CL 108 04/01/2016   CREATININE 0.77 04/01/2016   BUN 17 04/01/2016   CO2 28 04/01/2016   TSH 0.28 (L) 04/01/2016   INR 3.03 (H) 05/12/2014    Dg Esophagus  Result Date: 10/14/2016 CLINICAL DATA:  Dysphagia.  Pills and food intermittently gets stuck EXAM: ESOPHOGRAM / BARIUM SWALLOW / BARIUM TABLET STUDY TECHNIQUE: Combined double contrast and single contrast examination performed using effervescent crystals, thick barium liquid, and thin barium liquid. The patient was observed with fluoroscopy swallowing a 13 mm barium sulphate tablet. FLUOROSCOPY TIME:  Fluoroscopy Time:  1 minutes 24 seconds Radiation Exposure Index (if provided by the fluoroscopic device): 6.7 mGy Number of Acquired Spot Images: 0 COMPARISON:  None available (08/14/2002) FINDINGS: Lateral pharyngeal  imaging is normal. No obstructive process, aspiration, or diverticulum. The esophagus has normal mucosal contour and motility. There is a small and transient hiatal hernia with intermittently seen somewhat broad but smooth ring at the GE junction. This caused transient delay in passage of a 13 mm barium tablet. IMPRESSION: Lower esophageal ring above a small transient hiatal hernia. The ring had a transient appearance, favored muscular. There was only mild delay in passage of a 13 mm barium tablet. Electronically Signed   By: Monte Fantasia M.D.   On: 10/14/2016 09:36    Assessment & Plan:   There are no diagnoses linked to this encounter. I am having Elizabeth Gray maintain her Flaxseed Oil, Alum Hydroxide-Mag Carbonate (GAVISCON PO), Multiple Vitamins-Minerals (ICAPS AREDS FORMULA PO), vitamin C, traMADol, Cholecalciferol (VITAMIN D PO), aspirin, Polyethyl Glycol-Propyl Glycol, magnesium oxide, OVER THE COUNTER MEDICATION, GLUCOSAMINE-CHONDROITIN-MSM-D3 PO, omeprazole-sodium bicarbonate, Pancrelipase (Lip-Prot-Amyl), fluticasone,  levothyroxine, topiramate, cyanocobalamin, and zolpidem. We will continue to administer sodium chloride.  No orders of the defined types were placed in this encounter.    Follow-up: No Follow-up on file.  Walker Kehr, MD

## 2017-09-06 NOTE — Patient Instructions (Signed)
Stop Listerin Use [employer] Peroxicare tooth paste Lemon in water

## 2017-09-06 NOTE — Assessment & Plan Note (Signed)
Iron tests

## 2017-09-07 LAB — IRON,TIBC AND FERRITIN PANEL
%SAT: 24 % (calc) (ref 11–50)
Ferritin: 17 ng/mL — ABNORMAL LOW (ref 20–288)
IRON: 86 ug/dL (ref 45–160)
TIBC: 357 ug/dL (ref 250–450)

## 2017-09-12 ENCOUNTER — Encounter: Payer: Self-pay | Admitting: Internal Medicine

## 2017-09-26 DIAGNOSIS — Z1231 Encounter for screening mammogram for malignant neoplasm of breast: Secondary | ICD-10-CM | POA: Diagnosis not present

## 2017-10-03 DIAGNOSIS — N6002 Solitary cyst of left breast: Secondary | ICD-10-CM | POA: Diagnosis not present

## 2017-10-03 DIAGNOSIS — R928 Other abnormal and inconclusive findings on diagnostic imaging of breast: Secondary | ICD-10-CM | POA: Diagnosis not present

## 2017-10-17 ENCOUNTER — Other Ambulatory Visit: Payer: Self-pay

## 2017-10-17 MED ORDER — FLUTICASONE PROPIONATE HFA 110 MCG/ACT IN AERO: 1.0000 | INHALATION_SPRAY | Freq: Two times a day (BID) | RESPIRATORY_TRACT | 5 refills | Status: DC

## 2017-10-29 ENCOUNTER — Other Ambulatory Visit: Payer: Self-pay | Admitting: Internal Medicine

## 2017-10-29 ENCOUNTER — Other Ambulatory Visit: Payer: Self-pay | Admitting: Physician Assistant

## 2017-11-19 ENCOUNTER — Other Ambulatory Visit: Payer: Self-pay | Admitting: Internal Medicine

## 2017-12-04 ENCOUNTER — Ambulatory Visit (INDEPENDENT_AMBULATORY_CARE_PROVIDER_SITE_OTHER): Payer: Medicare Other | Admitting: Diagnostic Neuroimaging

## 2017-12-04 ENCOUNTER — Encounter: Payer: Self-pay | Admitting: Diagnostic Neuroimaging

## 2017-12-04 ENCOUNTER — Encounter

## 2017-12-04 VITALS — BP 142/76 | HR 64 | Ht 63.5 in | Wt 159.0 lb

## 2017-12-04 DIAGNOSIS — G44309 Post-traumatic headache, unspecified, not intractable: Secondary | ICD-10-CM | POA: Diagnosis not present

## 2017-12-04 MED ORDER — TOPIRAMATE 100 MG PO TABS: 100.0000 mg | ORAL_TABLET | Freq: Every day | ORAL | 4 refills | Status: DC

## 2017-12-04 NOTE — Progress Notes (Signed)
PATIENT: Elizabeth Gray DOB: 1940/10/03  REASON FOR VISIT: follow up HISTORY FROM: patient  Chief Complaint  Patient presents with  . Follow-up    headaches; She still has some. If she lays on her back or a little on her R side, the room will start spinning. She has had some nausea with the headache. Two weeks ago she had trouble with headache and nausea for most of the week.    Marland Kitchen Room 7    She is here alone    HISTORY OF PRESENT ILLNESS:  UPDATE (12/04/17, VRP): Since last visit, doing about the same. Tolerating TPX. No alleviating or aggravating factors. Continues with HA and nausea. Still with caregiver stress related to ailing health of her husband. Also with poor sleep quality.   UPDATE 05/13/16: Since last visit, HA are stable. Mild dull HA today (? Weather related). Sleep apnea stable on CPAP. Tolerating TPX. Some new left leg swelling in afternoons.   UPDATE 05/11/15: Since last visit, overall stable. Sometimes with standing or moving too quickly, triggers vertigo attacks. For now, symptoms are mild.   UPDATE 11/10/14: Since last visit, doing much better. HA are much improved. On TPX '100mg'$  qhs. Prednisone course seemed to have helped.  UPDATE 09/19/14: Since last visit patient was doing well. She had knee replacement surgery in October 2015. Following this patient had forgot to restart topiramate. She continues to do well without headaches. Unfortunately 08/28/2014, patient had a fall when she was helping unload wood from pickup truck, fell backwards and struck her head. She had pain in the back of her head. She was able to finish her work. 2 days later she had recurrence of her prior migraine headaches. She describes left frontal throbbing severe headaches with sensitivity to light and sound. She has been taking Tylenol arthritis strength, 2 tabs twice a day for several weeks. She is also taking topiramate 100 mg twice a day again. This week headaches were slightly better. She  rates headaches as 5 out of 10. Also a few days ago patient noticed intermittent tingling in her right hand fingertips, digits 2, 3, 5. Symptoms last 10-20 minutes at a time. She also noticed some discoloration in her right third digit distally, lasting a few minutes. Patient also having some intermittent left-sided neck pain, radiating to left arm.  UPDATE 03/10/14 (LL): Patient is tolerating tinnitus with TPX 100 mg bid for less frequent, less severe headaches. No other known side effects. Pleased with current treatment. Planning right total knee replacement in October.  UPDATE 09/09/13: Since last visit, was doing well until she developed tinnitus. Her PCP advised her to reduce TPX. She reduced to '50mg'$  BID, then to '25mg'$  BID. Tinnitus improved, but then HA worsened. Now back to '100mg'$  BID, but tinnitus has returned. 2 weeks ago had migraine (HA + nausea), took tylenol and went to sleep with resolution of HA.   UPDATE 09/07/12: Was doing better, then more HA x last 2 months. Not sleeping well at night. Wakes up multiple times to check on husband (who falls asleep in chair). Not much physical activity. HA are similar quality as before.   UPDATE 03/05/12: Doing better. No headaches since last visit. She has had noticed floaters that appear like a feather; notices in am when she may not have had enough sleep, last episode 2 weeks ago. She has a history of floaters since age 22. Tolerating TPX '100mg'$  qhs without any memory or focus problems or numbness/tingling.  UPDATE 10/31/11: Doing little bit better. On TPX '100mg'$  qhs; couldn't tolerate BID dosing. No side effects. Sinus pressure sensation has improved.   PRIOR HPI: 77 year old ambidextrous, right dominant, female with history of pulmonary sarcoidosis, obstructive sleep apnea, osteoarthritis, here for evaluation of headaches since October 2012. Patient reports feeling sinus infection symptoms and frontal headache in October 2012. She was prescribed azithromycin.  Her symptoms did not improve. She was then referred to ENT, had CT of the sinuses, which showed no significant sinus disease. For the past 2 months she's been taking tramadol and ibuprofen on a daily basis for knee pain. At the onset of the headache symptom she was taking Sudafed but no other pain medications.  Patient reports pressure and mild throbbing sensation in the bifrontal and bitemporal regions. No nausea, vomiting, photophobia or visual scotoma. Sometimes she has mild blurred vision and phonophobia with her headaches. Headache severity is 2/10 on a daily basis, sometimes increased to 4 or 5/10 once a week.    REVIEW OF SYSTEMS: Full 14 system review of systems performed and negative: only for hearing loss ringing in ears eye redness light sens headaches restless legs apnea.    ALLERGIES: Allergies  Allergen Reactions  . Dexilant [Dexlansoprazole]     "set my stomach on fire and made me SOB"  . Metoclopramide Hcl Other (See Comments)    REGLAN="burning in mouth"  . Oxymetazoline Hcl Itching    AFRIN SPRAY  . Restasis [Cyclosporine]     "can't use it"  . Sucralfate Other (See Comments)    CARAFATE=Mouth sores    HOME MEDICATIONS: Outpatient Medications Prior to Visit  Medication Sig Dispense Refill  . Alum Hydroxide-Mag Carbonate (GAVISCON PO) Take 1 tablet by mouth as needed (reflux). As directed as needed    . aspirin 81 MG tablet Take 81 mg by mouth daily.    . Cholecalciferol (VITAMIN D PO) Take 1,000 Units by mouth daily.     Marland Kitchen conjugated estrogens (PREMARIN) vaginal cream Use 1 applicator full qhs x 1 week then one q 3-7 days PV 42.5 g 11  . cyanocobalamin (,VITAMIN B-12,) 1000 MCG/ML injection INJECT 1 ML INTO THE MUSCLE EVERY 14 DAYS 10 mL 3  . Flaxseed, Linseed, (FLAXSEED OIL) OIL Take 1 capsule by mouth daily.     . fluticasone (FLOVENT HFA) 110 MCG/ACT inhaler Inhale 1 puff into the lungs 2 (two) times daily. 1 Inhaler 5  . GLUCOSAMINE-CHONDROITIN-MSM-D3 PO Take 1  tablet by mouth 2 (two) times daily.    Marland Kitchen levothyroxine (SYNTHROID, LEVOTHROID) 100 MCG tablet TAKE 1 TABLET BY MOUTH EVERY DAY 90 tablet 1  . magnesium oxide (MAG-OX) 400 MG tablet Take 400 mg by mouth at bedtime.    . Multiple Vitamins-Minerals (ICAPS AREDS FORMULA PO) Take 1 capsule by mouth daily.      Marland Kitchen omeprazole-sodium bicarbonate (ZEGERID) 40-1100 MG capsule TAKE 1 CAPSULE BY MOUTH DAILY BEFORE BREAKFAST. 90 capsule 3  . OVER THE COUNTER MEDICATION Take 5 tablets by mouth 2 (two) times daily. Supplement - [place]    . Pancrelipase, Lip-Prot-Amyl, (CREON) 24000-76000 units CPEP TAKE 1 CAPSULE (24,000 UNITS TOTAL) BY MOUTH 3 (THREE) TIMES DAILY. 270 capsule 11  . Polyethyl Glycol-Propyl Glycol (SYSTANE ULTRA) 0.4-0.3 % SOLN Apply 1 drop to eye as needed (dry eyes).     . topiramate (TOPAMAX) 100 MG tablet TAKE 1 TABLET (100 MG TOTAL) BY MOUTH AT BEDTIME. 90 tablet 3  . traMADol (ULTRAM) 50 MG tablet  Take 100 mg by mouth at bedtime.     . vitamin C (ASCORBIC ACID) 500 MG tablet Take 500 mg by mouth daily.      Marland Kitchen zolpidem (AMBIEN) 5 MG tablet Take 1 tablet (5 mg total) by mouth at bedtime. Patient needs office visit before refills will be given 30 tablet 3  . omeprazole-sodium bicarbonate (ZEGERID) 40-1100 MG capsule TAKE 1 CAPSULE BY MOUTH DAILY BEFORE BREAKFAST. 90 capsule 0   Facility-Administered Medications Prior to Visit  Medication Dose Route Frequency Provider Last Rate Last Dose  . 0.9 %  sodium chloride infusion  500 mL Intravenous Continuous Gatha Mayer, MD        PHYSICAL EXAM Vitals:   12/04/17 1325  BP: (!) 142/76  Pulse: 64  Weight: 159 lb (72.1 kg)  Height: 5' 3.5" (1.613 m)   Body mass index is 27.72 kg/m.   GENERAL EXAM: Patient is in no distress; well developed, nourished and groomed; neck is supple  CARDIOVASCULAR: Regular rate and rhythm, no murmurs, no carotid bruits  NEUROLOGIC: MENTAL STATUS: awake, alert, language fluent,  comprehension intact, naming intact, fund of knowledge appropriate CRANIAL NERVE: pupils equal and reactive to light, visual fields full to confrontation, extraocular muscles intact, no nystagmus, facial sensation and strength symmetric, hearing intact, palate elevates symmetrically, uvula midline, shoulder shrug symmetric, tongue midline. MOTOR: normal bulk and tone, full strength in the BUE, BLE SENSORY: normal and symmetric to light touch, temperature, vibration COORDINATION: finger-nose-finger, fine finger movements normal REFLEXES: deep tendon reflexes present and symmetric GAIT/STATION: narrow based gait    DIAGNOSTIC DATA (LABS, IMAGING, TESTING)  06/20/11 CT sinus - no significant sinus disease; mild bulging of left nasal concha   09/16/11 MRI brain - left CP angle arachnoid cyst, otherwise normal   09/09/11 ESR, CRP - normal   10/10/14 MRI brain - normal   10/10/14 MRI cervical spine -  degenerative changes at C5-C6, C6-C7 and T1-T2. At C5-C6 there is uncovertebral spurring, mild disc protrusion and facet hypertrophy. However, the neural foramen just mildly narrowed and there is no nerve root impingement. The degenerative changes at C6-C7 and T1 T2 are milder and there is no nerve root impingement.  10/10/14 carotid u/s - normal     ASSESSMENT: 77 y.o. female here with pulmonary sarcoidosis, sleep apnea, previously evaluated for intermittent headaches (migraine variant).   S/p fall and head trauma Aug 20, 2014. With intermittent right hand fingertip numbness recently. Ongoing headaches and neck pain --> Now resolved.   Dx: post-traumatic headache / migraine   Post-traumatic headache, not intractable, unspecified chronicity pattern    PLAN:   - continue topiramate '100mg'$  at bedtime; drink plenty of water  Meds ordered this encounter  Medications  . topiramate (TOPAMAX) 100 MG tablet    Sig: Take 1 tablet (100 mg total) by mouth at bedtime.    Dispense:  90 tablet     Refill:  4   Return if symptoms worsen or fail to improve, for return to PCP.     Penni Bombard, MD 09/06/3333, 4:56 PM Certified in Neurology, Neurophysiology and Neuroimaging  [facility] [address], [place], [place] [address] [phone]

## 2017-12-26 ENCOUNTER — Other Ambulatory Visit: Payer: Self-pay | Admitting: Internal Medicine

## 2017-12-26 DIAGNOSIS — E034 Atrophy of thyroid (acquired): Secondary | ICD-10-CM

## 2017-12-26 DIAGNOSIS — E559 Vitamin D deficiency, unspecified: Secondary | ICD-10-CM

## 2017-12-26 DIAGNOSIS — Z0000 Encounter for general adult medical examination without abnormal findings: Secondary | ICD-10-CM

## 2017-12-26 MED ORDER — ZOLPIDEM TARTRATE 5 MG PO TABS: ORAL_TABLET | ORAL | 0 refills | Status: DC

## 2017-12-26 NOTE — Telephone Encounter (Signed)
Please advise about refill in Dr. Enis Slipper absence. Checked controlled substance database, last filled 11/26/2017.

## 2017-12-26 NOTE — Telephone Encounter (Signed)
Done erx 

## 2018-01-05 ENCOUNTER — Encounter: Payer: Self-pay | Admitting: Internal Medicine

## 2018-02-05 DIAGNOSIS — H43811 Vitreous degeneration, right eye: Secondary | ICD-10-CM | POA: Diagnosis not present

## 2018-02-05 DIAGNOSIS — H25813 Combined forms of age-related cataract, bilateral: Secondary | ICD-10-CM | POA: Diagnosis not present

## 2018-02-05 DIAGNOSIS — H40003 Preglaucoma, unspecified, bilateral: Secondary | ICD-10-CM | POA: Diagnosis not present

## 2018-02-09 ENCOUNTER — Other Ambulatory Visit: Payer: Self-pay | Admitting: Physician Assistant

## 2018-02-12 DIAGNOSIS — L720 Epidermal cyst: Secondary | ICD-10-CM | POA: Diagnosis not present

## 2018-02-12 DIAGNOSIS — L821 Other seborrheic keratosis: Secondary | ICD-10-CM | POA: Diagnosis not present

## 2018-02-12 DIAGNOSIS — Z85820 Personal history of malignant melanoma of skin: Secondary | ICD-10-CM | POA: Diagnosis not present

## 2018-02-12 DIAGNOSIS — D2262 Melanocytic nevi of left upper limb, including shoulder: Secondary | ICD-10-CM | POA: Diagnosis not present

## 2018-02-14 DIAGNOSIS — M9903 Segmental and somatic dysfunction of lumbar region: Secondary | ICD-10-CM | POA: Diagnosis not present

## 2018-02-14 DIAGNOSIS — M9905 Segmental and somatic dysfunction of pelvic region: Secondary | ICD-10-CM | POA: Diagnosis not present

## 2018-02-14 DIAGNOSIS — M5431 Sciatica, right side: Secondary | ICD-10-CM | POA: Diagnosis not present

## 2018-02-14 DIAGNOSIS — M9904 Segmental and somatic dysfunction of sacral region: Secondary | ICD-10-CM | POA: Diagnosis not present

## 2018-02-19 ENCOUNTER — Encounter: Payer: Self-pay | Admitting: Pulmonary Disease

## 2018-02-19 ENCOUNTER — Ambulatory Visit (INDEPENDENT_AMBULATORY_CARE_PROVIDER_SITE_OTHER): Payer: Medicare Other | Admitting: Pulmonary Disease

## 2018-02-19 DIAGNOSIS — D869 Sarcoidosis, unspecified: Secondary | ICD-10-CM

## 2018-02-19 DIAGNOSIS — Z9989 Dependence on other enabling machines and devices: Secondary | ICD-10-CM

## 2018-02-19 DIAGNOSIS — G4733 Obstructive sleep apnea (adult) (pediatric): Secondary | ICD-10-CM | POA: Diagnosis not present

## 2018-02-19 NOTE — Progress Notes (Signed)
   Subjective:    Patient ID: Elizabeth Gray, female    DOB: 1941-03-07, 77 y.o.   MRN: [id number]  HPI  77 yo or FU of sarcoid & OSA -on cpap 12cmh20 she takes care of her 81 year old husband who has pulmonary hypertension And just went on CPAP.  She had a new machine about a year ago and this seems to be working well  No problems with full facemask or pressure. CPAP download was reviewed which shows excellent control of events on 12 cm with average usage about 5 hours and no leak.  She denies cough.  She continues to use Flovent 1 puff once daily Significant tests/ events  PSG 06/2008 showed predominantly REM related obstructive sleep apnea , correctd by CPAP 12 cm.  CT 2008 Subtle upper lobe predominant perilymphatic nodularity with mildly prominent mediastinal and bihilar lymphoid tissue as well as gastrohepatic ligament lymph node. Question sarcoid. Nodular splenic enhancement pattern  05/2012 RAST neg    Review of Systems neg for any significant sore throat, dysphagia, itching, sneezing, nasal congestion or excess/ purulent secretions, fever, chills, sweats, unintended wt loss, pleuritic or exertional cp, hempoptysis, orthopnea pnd or change in chronic leg swelling. Also denies presyncope, palpitations, heartburn, abdominal pain, nausea, vomiting, diarrhea or change in bowel or urinary habits, dysuria,hematuria, rash, arthralgias, visual complaints, headache, numbness weakness or ataxia.     Objective:   Physical Exam   Gen. Pleasant, well-nourished, in no distress ENT - no thrush, no post nasal drip Neck: No JVD, no thyromegaly, no carotid bruits Lungs: no use of accessory muscles, no dullness to percussion, clear without rales or rhonchi  Cardiovascular: Rhythm regular, heart sounds  normal, no murmurs or gallops, no peripheral edema Musculoskeletal: No deformities, no cyanosis or clubbing         Assessment & Plan:

## 2018-02-19 NOTE — Assessment & Plan Note (Signed)
Okay to trial decrease of Flovent to once every other day and come off if you are feeling well

## 2018-02-19 NOTE — Patient Instructions (Signed)
CPAP is set at 12 cm and is working well.  Okay to trial decrease of Flovent to once every other day and come off if you are feeling well

## 2018-02-19 NOTE — Assessment & Plan Note (Signed)
CPAP is set at 12 cm and is working well.  Weight loss encouraged, compliance with goal of at least 4-6 hrs every night is the expectation. Advised against medications with sedative side effects Cautioned against driving when sleepy - understanding that sleepiness will vary on a day to day basis  

## 2018-03-05 ENCOUNTER — Ambulatory Visit (AMBULATORY_SURGERY_CENTER): Payer: Self-pay

## 2018-03-05 ENCOUNTER — Other Ambulatory Visit: Payer: Self-pay

## 2018-03-05 VITALS — Ht 63.5 in | Wt 157.0 lb

## 2018-03-05 DIAGNOSIS — Z86010 Personal history of colonic polyps: Secondary | ICD-10-CM

## 2018-03-05 NOTE — Progress Notes (Signed)
No egg or soy allergy known to patient  No issues with past sedation with any surgeries  or procedures, no intubation problems  No diet pills per patient No home 02 use per patient  No blood thinners per patient  Pt denies issues with constipation  No A fib or A flutter  EMMI video sent to pt's e mail , pt declined    

## 2018-03-07 DIAGNOSIS — M9905 Segmental and somatic dysfunction of pelvic region: Secondary | ICD-10-CM | POA: Diagnosis not present

## 2018-03-07 DIAGNOSIS — M9903 Segmental and somatic dysfunction of lumbar region: Secondary | ICD-10-CM | POA: Diagnosis not present

## 2018-03-07 DIAGNOSIS — M9904 Segmental and somatic dysfunction of sacral region: Secondary | ICD-10-CM | POA: Diagnosis not present

## 2018-03-07 DIAGNOSIS — M5431 Sciatica, right side: Secondary | ICD-10-CM | POA: Diagnosis not present

## 2018-03-19 ENCOUNTER — Telehealth: Payer: Self-pay | Admitting: Internal Medicine

## 2018-03-19 NOTE — Telephone Encounter (Signed)
Spoke with patient, and she stated that she got really nauseated from the prep and started sweating.  States that she feels much better now.  I suggested that she slow down the drinking of the prep.  She agreed.  She stated that the Gatorade was "way too sweet."  She will call again if she can't get the prep down.

## 2018-03-20 ENCOUNTER — Ambulatory Visit (AMBULATORY_SURGERY_CENTER): Payer: Medicare Other | Admitting: Internal Medicine

## 2018-03-20 ENCOUNTER — Telehealth: Payer: Self-pay | Admitting: Internal Medicine

## 2018-03-20 ENCOUNTER — Encounter: Payer: Self-pay | Admitting: Internal Medicine

## 2018-03-20 VITALS — BP 130/67 | HR 65 | Temp 97.5°F | Resp 11 | Ht 64.0 in | Wt 154.0 lb

## 2018-03-20 DIAGNOSIS — Z86010 Personal history of colonic polyps: Secondary | ICD-10-CM | POA: Diagnosis not present

## 2018-03-20 DIAGNOSIS — D122 Benign neoplasm of ascending colon: Secondary | ICD-10-CM

## 2018-03-20 MED ORDER — SODIUM CHLORIDE 0.9 % IV SOLN: 500.0000 mL | Freq: Once | INTRAVENOUS | Status: DC

## 2018-03-20 NOTE — Progress Notes (Signed)
Verbal order per Dr.Gessner schedule EGD for dysphagia

## 2018-03-20 NOTE — Progress Notes (Signed)
Pt's states no medical or surgical changes since previsit or office visit. 

## 2018-03-20 NOTE — Progress Notes (Deleted)
Called to room to assist during endoscopic procedure.  Patient ID and intended procedure confirmed with present staff. Received instructions for my participation in the procedure from the performing physician.  

## 2018-03-20 NOTE — Op Note (Signed)
[facility] Patient Name: Elizabeth Gray Procedure Date: 03/20/2018 8:42 AM MRN: [id number] Endoscopist: Gatha Mayer , MD Age: 77 Referring MD:  Date of Birth: [birth year] Gender: Female Account #: [id number] Procedure:                Colonoscopy Indications:              Surveillance: Personal history of adenomatous                            polyps on last colonoscopy 5 years ago Medicines:                Propofol per Anesthesia, Monitored Anesthesia Care Procedure:                Pre-Anesthesia Assessment:                           - Prior to the procedure, a History and Physical                            was performed, and patient medications and                            allergies were reviewed. The patient's tolerance of                            previous anesthesia was also reviewed. The risks                            and benefits of the procedure and the sedation                            options and risks were discussed with the patient.                            All questions were answered, and informed consent                            was obtained. Prior Anticoagulants: The patient has                            taken no previous anticoagulant or antiplatelet                            agents. ASA Grade Assessment: II - A patient with                            mild systemic disease. After reviewing the risks                            and benefits, the patient was deemed in                            satisfactory condition to undergo the procedure.  After obtaining informed consent, the colonoscope                            was passed under direct vision. Throughout the                            procedure, the patient's blood pressure, pulse, and                            oxygen saturations were monitored continuously. The                            Model PCF-H190DL [id number]) scope was introduced     through the anus and advanced to the the cecum,                            identified by appendiceal orifice and ileocecal                            valve. The colonoscopy was somewhat difficult due                            to a redundant colon. Successful completion of the                            procedure was aided by applying abdominal pressure.                            The patient tolerated the procedure well. The                            quality of the bowel preparation was good. The                            ileocecal valve, appendiceal orifice, and rectum                            were photographed. The bowel preparation used was                            Miralax. Scope In: 8:45:55 AM Scope Out: 9:05:48 AM Scope Withdrawal Time: 0 hours 13 minutes 32 seconds  Total Procedure Duration: 0 hours 19 minutes 53 seconds  Findings:                 A 2 to 3 mm polyp was found in the ascending colon.                            The polyp was sessile. The polyp was removed with a                            cold biopsy forceps. Resection and retrieval were  complete. Verification of patient identification                            for the specimen was done. Estimated blood loss was                            minimal.                           The exam was otherwise without abnormality on                            direct and retroflexion views. Complications:            No immediate complications. Estimated Blood Loss:     Estimated blood loss was minimal. Impression:               - One 2 to 3 mm polyp in the ascending colon,                            removed with a cold biopsy forceps. Resected and                            retrieved.                           - The examination was otherwise normal on direct                            and retroflexion views.                           - Personal history of colonic polyps. Recommendation:            - Patient has a contact number available for                            emergencies. The signs and symptoms of potential                            delayed complications were discussed with the                            patient. Return to normal activities tomorrow.                            Written discharge instructions were provided to the                            patient.                           - Resume previous diet.                           - Continue present medications.                           -  No repeat colonoscopy due to current age (66                            years or older). Gatha Mayer, MD 03/20/2018 9:17:00 AM This report has been signed electronically.

## 2018-03-20 NOTE — Progress Notes (Signed)
Called to room to assist during endoscopic procedure.  Patient ID and intended procedure confirmed with present staff. Received instructions for my participation in the procedure from the performing physician.  

## 2018-03-20 NOTE — Progress Notes (Signed)
Spontaneous respirations throughout. VSS. Resting comfortably. To PACU on room air. Report to  RN. 

## 2018-03-20 NOTE — Patient Instructions (Addendum)
   I found and removed one tiny polyp today.  I do not think you will need any more routine colonoscopy.  I appreciate the opportunity to care for you. Gatha Mayer, MD, [facility]   * EGD scheduled, handout on polyps given*  YOU HAD AN ENDOSCOPIC PROCEDURE TODAY AT [facility]:   Refer to the procedure report that was given to you for any specific questions about what was found during the examination.  If the procedure report does not answer your questions, please call your gastroenterologist to clarify.  If you requested that your care partner not be given the details of your procedure findings, then the procedure report has been included in a sealed envelope for you to review at your convenience later.  YOU SHOULD EXPECT: Some feelings of bloating in the abdomen. Passage of more gas than usual.  Walking can help get rid of the air that was put into your GI tract during the procedure and reduce the bloating. If you had a lower endoscopy (such as a colonoscopy or flexible sigmoidoscopy) you may notice spotting of blood in your stool or on the toilet paper. If you underwent a bowel prep for your procedure, you may not have a normal bowel movement for a few days.  Please Note:  You might notice some irritation and congestion in your nose or some drainage.  This is from the oxygen used during your procedure.  There is no need for concern and it should clear up in a day or so.  SYMPTOMS TO REPORT IMMEDIATELY:   Following lower endoscopy (colonoscopy or flexible sigmoidoscopy):  Excessive amounts of blood in the stool  Significant tenderness or worsening of abdominal pains  Swelling of the abdomen that is new, acute  Fever of 100F or higher   For urgent or emergent issues, a gastroenterologist can be reached at any hour by calling [phone].   DIET:  We do recommend a small meal at first, but then you may proceed to your regular diet.  Drink plenty of fluids but you  should avoid alcoholic beverages for 24 hours.  ACTIVITY:  You should plan to take it easy for the rest of today and you should NOT DRIVE or use heavy machinery until tomorrow (because of the sedation medicines used during the test).    FOLLOW UP: Our staff will call the number listed on your records the next business day following your procedure to check on you and address any questions or concerns that you may have regarding the information given to you following your procedure. If we do not reach you, we will leave a message.  However, if you are feeling well and you are not experiencing any problems, there is no need to return our call.  We will assume that you have returned to your regular daily activities without incident.  If any biopsies were taken you will be contacted by phone or by letter within the next 1-3 weeks.  Please call us at [phone] if you have not heard about the biopsies in 3 weeks.    SIGNATURES/CONFIDENTIALITY: You and/or your care partner have signed paperwork which will be entered into your electronic medical record.  These signatures attest to the fact that that the information above on your After Visit Summary has been reviewed and is understood.  Full responsibility of the confidentiality of this discharge information lies with you and/or your care-partner.

## 2018-03-20 NOTE — Telephone Encounter (Signed)
Conversation after colonoscopy: Patient reports recurrent dysphagia to solids and pills and asking for another esophageal dilation. She said she was helped considerably by dilation last year.  Staff will set up previsit and EGD/dili

## 2018-03-21 ENCOUNTER — Telehealth: Payer: Self-pay | Admitting: *Deleted

## 2018-03-21 NOTE — Telephone Encounter (Signed)
  Follow up Call-  Call back number 03/20/2018 11/18/2016  Post procedure Call Back phone  # [phone]  Permission to leave phone message No Yes  Some recent data might be hidden     Patient questions:  Do you have a fever, pain , or abdominal swelling? No. Pain Score  0 *  Have you tolerated food without any problems? Yes.    Have you been able to return to your normal activities? Yes.    Do you have any questions about your discharge instructions: Diet   No. Medications  No. Follow up visit  No.  Do you have questions or concerns about your Care? No.  Actions: * If pain score is 4 or above: No action needed, pain <4.

## 2018-03-25 ENCOUNTER — Encounter: Payer: Self-pay | Admitting: Internal Medicine

## 2018-03-25 NOTE — Progress Notes (Signed)
Adenoma No recall age

## 2018-03-28 DIAGNOSIS — M5431 Sciatica, right side: Secondary | ICD-10-CM | POA: Diagnosis not present

## 2018-03-28 DIAGNOSIS — M9905 Segmental and somatic dysfunction of pelvic region: Secondary | ICD-10-CM | POA: Diagnosis not present

## 2018-03-28 DIAGNOSIS — M9904 Segmental and somatic dysfunction of sacral region: Secondary | ICD-10-CM | POA: Diagnosis not present

## 2018-03-28 DIAGNOSIS — M9903 Segmental and somatic dysfunction of lumbar region: Secondary | ICD-10-CM | POA: Diagnosis not present

## 2018-04-16 DIAGNOSIS — M9904 Segmental and somatic dysfunction of sacral region: Secondary | ICD-10-CM | POA: Diagnosis not present

## 2018-04-16 DIAGNOSIS — M9905 Segmental and somatic dysfunction of pelvic region: Secondary | ICD-10-CM | POA: Diagnosis not present

## 2018-04-16 DIAGNOSIS — M9903 Segmental and somatic dysfunction of lumbar region: Secondary | ICD-10-CM | POA: Diagnosis not present

## 2018-04-16 DIAGNOSIS — M5431 Sciatica, right side: Secondary | ICD-10-CM | POA: Diagnosis not present

## 2018-04-19 ENCOUNTER — Ambulatory Visit (AMBULATORY_SURGERY_CENTER): Payer: Self-pay | Admitting: *Deleted

## 2018-04-19 VITALS — Ht 63.5 in | Wt 154.2 lb

## 2018-04-19 DIAGNOSIS — R1310 Dysphagia, unspecified: Secondary | ICD-10-CM

## 2018-04-19 NOTE — Progress Notes (Signed)
No egg or soy allergy known to patient  No issues with past sedation with any surgeries  or procedures, no intubation problems - had MAC 03-20-18 with colon with no issues  No diet pills per patient No home 02 use per patient  No blood thinners per patient  Pt denies issues with constipation  No A fib or A flutter  EMMI video sent to pt's e mail - pt declined

## 2018-04-25 ENCOUNTER — Encounter: Payer: Medicare Other | Admitting: Internal Medicine

## 2018-04-26 ENCOUNTER — Other Ambulatory Visit: Payer: Self-pay | Admitting: Internal Medicine

## 2018-05-01 ENCOUNTER — Ambulatory Visit (AMBULATORY_SURGERY_CENTER): Payer: Medicare Other | Admitting: Internal Medicine

## 2018-05-01 ENCOUNTER — Encounter: Payer: Self-pay | Admitting: Internal Medicine

## 2018-05-01 VITALS — BP 112/58 | HR 69 | Temp 98.7°F | Resp 13 | Ht 63.0 in | Wt 154.0 lb

## 2018-05-01 DIAGNOSIS — R1310 Dysphagia, unspecified: Secondary | ICD-10-CM | POA: Diagnosis not present

## 2018-05-01 MED ORDER — SODIUM CHLORIDE 0.9 % IV SOLN: 500.0000 mL | Freq: Once | INTRAVENOUS | Status: DC

## 2018-05-01 NOTE — Progress Notes (Signed)
Pt's states no medical or surgical changes since previsit or office visit. 

## 2018-05-01 NOTE — Op Note (Signed)
[facility] Patient Name: Elizabeth Gray Procedure Date: 05/01/2018 10:26 AM MRN: [id number] Endoscopist: Gatha Mayer , MD Age: 77 Referring MD:  Date of Birth: 1940-10-02 Gender: Female Account #: [id number] Procedure:                Upper GI endoscopy Indications:              Dysphagia Medicines:                Propofol per Anesthesia, Monitored Anesthesia Care Procedure:                Pre-Anesthesia Assessment:                           - Prior to the procedure, a History and Physical                            was performed, and patient medications and                            allergies were reviewed. The patient's tolerance of                            previous anesthesia was also reviewed. The risks                            and benefits of the procedure and the sedation                            options and risks were discussed with the patient.                            All questions were answered, and informed consent                            was obtained. Prior Anticoagulants: The patient has                            taken no previous anticoagulant or antiplatelet                            agents. ASA Grade Assessment: II - A patient with                            mild systemic disease. After reviewing the risks                            and benefits, the patient was deemed in                            satisfactory condition to undergo the procedure.                           After obtaining informed consent, the endoscope was  passed under direct vision. Throughout the                            procedure, the patient's blood pressure, pulse, and                            oxygen saturations were monitored continuously. The                            Endoscope was introduced through the mouth, and                            advanced to the antrum of the stomach. The upper GI                            endoscopy was  accomplished without difficulty. The                            patient tolerated the procedure well. Scope In: Scope Out: Findings:                 The examined esophagus was mildly tortuous. A TTS                            dilator was passed through the scope. Dilation with                            an 18-19-20 mm balloon dilator was performed to 20                            mm at GE junction and drawn retrograde through most                            of esophagus. The dilation site was examined and                            showed no change. Estimated blood loss: none.                           Patchy moderately erythematous mucosa without                            bleeding was found in the gastric antrum.                           The exam was otherwise without abnormality.                           The cardia and gastric fundus were normal on                            retroflexion. Complications:            No immediate complications. Estimated Blood Loss:     Estimated blood loss: none.  Impression:               Exam to antrum.                           - Tortuous esophagus. Dilated to 20 mm w/ balloon.                           - Erythematous mucosa in the antrum.                           - The examination was otherwise normal.                           - No specimens collected. Recommendation:           - Patient has a contact number available for                            emergencies. The signs and symptoms of potential                            delayed complications were discussed with the                            patient. Return to normal activities tomorrow.                            Written discharge instructions were provided to the                            patient.                           - Clear liquids x 1 hour then soft foods rest of                            day. Start prior diet tomorrow.                           - Continue present medications.                            - Avoid cold foods/liquids - may induce spasm - I                            think much of her problems are from dysmotility.                           she has been helped by dilation so repeated. Gatha Mayer, MD 05/01/2018 10:52:41 AM This report has been signed electronically.

## 2018-05-01 NOTE — Patient Instructions (Addendum)
   I dilated the esophagus again. I hope that helps.  Remember that cold foods and liquids may make the esophagus spasm and difficult to swallow. Try warm liquids for relief if needed.  I appreciate the opportunity to care for you. Gatha Mayer, MD, FACG   YOU HAD AN ENDOSCOPIC PROCEDURE TODAY AT [facility]:   Refer to the procedure report that was given to you for any specific questions about what was found during the examination.  If the procedure report does not answer your questions, please call your gastroenterologist to clarify.  If you requested that your care partner not be given the details of your procedure findings, then the procedure report has been included in a sealed envelope for you to review at your convenience later.  YOU SHOULD EXPECT: Some feelings of bloating in the abdomen. Passage of more gas than usual.  Walking can help get rid of the air that was put into your GI tract during the procedure and reduce the bloating.   Please Note:  You might notice some irritation and congestion in your nose or some drainage.  This is from the oxygen used during your procedure.  There is no need for concern and it should clear up in a day or so.  SYMPTOMS TO REPORT IMMEDIATELY:    Following upper endoscopy (EGD)  Vomiting of blood or coffee ground material  New chest pain or pain under the shoulder blades  Painful or persistently difficult swallowing  New shortness of breath  Fever of 100F or higher  Black, tarry-looking stools  For urgent or emergent issues, a gastroenterologist can be reached at any hour by calling [phone].   DIET:  We do recommend clear liquids for 1 hour and then a soft diet for the rest of today., but then you may proceed to your regular diet tomorrow..  Drink plenty of fluids but you should avoid alcoholic beverages for 24 hours.  ACTIVITY:  You should plan to take it easy for the rest of today and you should NOT DRIVE  or use heavy machinery until tomorrow (because of the sedation medicines used during the test).    FOLLOW UP: Our staff will call the number listed on your records the next business day following your procedure to check on you and address any questions or concerns that you may have regarding the information given to you following your procedure. If we do not reach you, we will leave a message.  However, if you are feeling well and you are not experiencing any problems, there is no need to return our call.  We will assume that you have returned to your regular daily activities without incident.  If any biopsies were taken you will be contacted by phone or by letter within the next 1-3 weeks.  Please call us at [phone] if you have not heard about the biopsies in 3 weeks.    SIGNATURES/CONFIDENTIALITY: You and/or your care partner have signed paperwork which will be entered into your electronic medical record.  These signatures attest to the fact that that the information above on your After Visit Summary has been reviewed and is understood.  Full responsibility of the confidentiality of this discharge information lies with you and/or your care-partner.  Read all handouts given to you by your recovery room nurse.  Avoid cold foods to avoid spasms.

## 2018-05-01 NOTE — Progress Notes (Signed)
Called to room to assist during endoscopic procedure.  Patient ID and intended procedure confirmed with present staff. Received instructions for my participation in the procedure from the performing physician.  

## 2018-05-01 NOTE — Progress Notes (Signed)
Spontaneous respirations throughout. VSS. Resting comfortably. To PACU on room air. Report to  RN. 

## 2018-05-02 ENCOUNTER — Telehealth: Payer: Self-pay

## 2018-05-02 NOTE — Telephone Encounter (Signed)
  Follow up Call-  Call back number 05/01/2018 03/20/2018 11/18/2016  Post procedure Call Back phone  # [phone]  Permission to leave phone message Yes No Yes  Some recent data might be hidden     Patient questions:  Do you have a fever, pain , or abdominal swelling? No. Pain Score  0 *  Have you tolerated food without any problems? Yes.    Have you been able to return to your normal activities? Yes.    Do you have any questions about your discharge instructions: Diet   No. Medications  No. Follow up visit  No.  Do you have questions or concerns about your Care? No.  Actions: * If pain score is 4 or above: No action needed, pain <4.

## 2018-05-07 DIAGNOSIS — M5431 Sciatica, right side: Secondary | ICD-10-CM | POA: Diagnosis not present

## 2018-05-07 DIAGNOSIS — M9905 Segmental and somatic dysfunction of pelvic region: Secondary | ICD-10-CM | POA: Diagnosis not present

## 2018-05-07 DIAGNOSIS — M9904 Segmental and somatic dysfunction of sacral region: Secondary | ICD-10-CM | POA: Diagnosis not present

## 2018-05-07 DIAGNOSIS — M9903 Segmental and somatic dysfunction of lumbar region: Secondary | ICD-10-CM | POA: Diagnosis not present

## 2018-05-28 DIAGNOSIS — M9904 Segmental and somatic dysfunction of sacral region: Secondary | ICD-10-CM | POA: Diagnosis not present

## 2018-05-28 DIAGNOSIS — M5431 Sciatica, right side: Secondary | ICD-10-CM | POA: Diagnosis not present

## 2018-05-28 DIAGNOSIS — M9905 Segmental and somatic dysfunction of pelvic region: Secondary | ICD-10-CM | POA: Diagnosis not present

## 2018-05-28 DIAGNOSIS — M9903 Segmental and somatic dysfunction of lumbar region: Secondary | ICD-10-CM | POA: Diagnosis not present

## 2018-06-23 ENCOUNTER — Other Ambulatory Visit: Payer: Self-pay | Admitting: Internal Medicine

## 2018-06-23 DIAGNOSIS — E034 Atrophy of thyroid (acquired): Secondary | ICD-10-CM

## 2018-06-23 DIAGNOSIS — E559 Vitamin D deficiency, unspecified: Secondary | ICD-10-CM

## 2018-06-23 DIAGNOSIS — Z0000 Encounter for general adult medical examination without abnormal findings: Secondary | ICD-10-CM

## 2018-06-25 NOTE — Telephone Encounter (Signed)
   LOV:09/06/17 NextOV:not scheduled Last Filled/Quantity:05/02/18 30#

## 2018-07-30 DIAGNOSIS — Z23 Encounter for immunization: Secondary | ICD-10-CM | POA: Diagnosis not present

## 2018-09-05 DIAGNOSIS — M79641 Pain in right hand: Secondary | ICD-10-CM | POA: Diagnosis not present

## 2018-09-05 DIAGNOSIS — M79605 Pain in left leg: Secondary | ICD-10-CM | POA: Diagnosis not present

## 2018-09-10 ENCOUNTER — Telehealth: Payer: Self-pay | Admitting: *Deleted

## 2018-09-10 NOTE — Telephone Encounter (Signed)
Called patient to schedule AWV, patient scheduled for 09/12/18

## 2018-09-11 NOTE — Progress Notes (Addendum)
Subjective:   Elizabeth Gray is a 78 y.o. female who presents for Medicare Annual (Subsequent) preventive examination.  Review of Systems:  No ROS.  Medicare Wellness Visit. Additional risk factors are reflected in the social history.  Cardiac Risk Factors include: advanced age (>2men, >55 women) Sleep patterns: has interrupted sleep, has restless sleep, gets up 1-2 times nightly to void and sleeps hours varies nightly. Patient reports insomnia issues, discussed recommended sleep tips.   Home Safety/Smoke Alarms: Feels safe in home. Smoke alarms in place.  Living environment; residence and Firearm Safety: 2-story house. Lives alone no needs for DME, good support system Seat Belt Safety/Bike Helmet: Wears seat belt.     Objective:     Vitals: BP 112/68   Pulse 61   Ht 5\' 3"  (1.6 m)   Wt 146 lb (66.2 kg)   SpO2 96%   BMI 25.86 kg/m   Body mass index is 25.86 kg/m.  Advanced Directives 09/12/2018 11/18/2016 11/09/2016 09/05/2016 05/11/2015 02/23/2015 05/09/2014  Does Patient Have a Medical Advance Directive? Yes Yes Yes Yes Yes Yes Yes  Type of Paramedic of [place];Living will [place];Living will [place];Living will - [place];Living will Living will;Healthcare Power of Attorney -  Does patient want to make changes to medical advance directive? - - - - - - -  Copy of [place] in Chart? No - copy requested No - copy requested No - copy requested - No - copy requested - -  Pre-existing out of facility DNR order (yellow form or pink MOST form) - - - - - - -    Tobacco Social History   Tobacco Use  Smoking Status Former Smoker  . Packs/day: 1.00  . Years: 17.00  . Pack years: 17.00  . Types: Cigarettes  . Last attempt to quit: 08/01/1990  . Years since quitting: 28.1  Smokeless Tobacco Never Used     Counseling given: Not Answered  Past Medical History:  Diagnosis  Date  . Allergy   . Bursitis   . Cancer ([place]) 03/22/2017   Melanoma in situ, lentigo maligna type  . Deviated septum   . External hemorrhoids without mention of complication 9373   Colonoscopy   . Fatigue   . GERD (gastroesophageal reflux disease)   . Headache(784.0)    migraines  . Heat rash    under the breasts.Marland Kitchenappeared on monday.Marland KitchenMarland KitchenBurning & itching, uses cortisone  . Hypothyroidism   . Iron deficiency anemia, unspecified   . Lower esophageal ring 2008   EGD  . Nonorganic sleep disorder, unspecified   . Osteoarthritis   . Osteoporosis   . Pancreatitis   . Personal history of colonic polyps   . Polio 1948  . Pulmonary sarcoidosis ([place])   . Sleep apnea    cpap since 09 [facility]  . Vitamin B12 deficiency   . Vitamin D deficiency    Past Surgical History:  Procedure Laterality Date  . APPENDECTOMY  1988  . CARPAL TUNNEL RELEASE  2002   rt  . CHOLECYSTECTOMY  2000  . COLONOSCOPY  12/19/2006   Multiple diminutive polyps destroyed-removed (hyperpastic), internal hemorrhoids  . COSMETIC SURGERY    . ESOPHAGOGASTRODUODENOSCOPY  12/19/2006   lower esophageal ring dilated  . HAND SURGERY  1997   left  . KNEE ARTHROSCOPY Bilateral    multiple 2 on lft 1 on rt  . [place] SURGERY  1995  .  Melanoma Removal  Right    right face  . TOE SURGERY Left    left foot next to little toe  joint rem  . TOTAL KNEE ARTHROPLASTY Right 05/09/2014   Procedure: RIGHT TOTAL KNEE ARTHROPLASTY;  Surgeon: [name], MD;  Location: [facility];  Service: Orthopedics;  Laterality: Right;  . UPPER GASTROINTESTINAL ENDOSCOPY    . WISDOM TOOTH EXTRACTION     Family History  Problem Relation Age of Onset  . Brain cancer Father        brain tumor  . Migraines Mother   . Breast cancer Paternal Aunt   . Colon cancer Maternal Aunt   . Esophageal cancer Maternal Grandfather   . Rectal cancer Neg Hx   . Stomach cancer Neg Hx   . Colon polyps Neg Hx    Social History    Socioeconomic History  . Marital status: Widowed    Spouse name: [name]  . Number of children: 0  . Years of education: college  . Highest education level: Not on file  Occupational History  . Occupation: Statistician    Comment: works three days per week   . Occupation: LEGAL ASSISTANT 3d/wk    Employer: [employer]  . Financial resource strain: Not hard at all  . Food insecurity:    Worry: Never true    Inability: Never true  . Transportation needs:    Medical: No    Non-medical: No  Tobacco Use  . Smoking status: Former Smoker    Packs/day: 1.00    Years: 17.00    Pack years: 17.00    Types: Cigarettes    Last attempt to quit: 08/01/1990    Years since quitting: 28.1  . Smokeless tobacco: Never Used  Substance and Sexual Activity  . Alcohol use: No    Alcohol/week: 0.0 standard drinks  . Drug use: No  . Sexual activity: Never  Lifestyle  . Physical activity:    Days per week: 0 days    Minutes per session: 0 min  . Stress: To some extent  Relationships  . Social connections:    Talks on phone: More than three times a week    Gets together: More than three times a week    Attends religious service: More than 4 times per year    Active member of club or organization: Yes    Attends meetings of clubs or organizations: More than 4 times per year    Relationship status: Widowed  Other Topics Concern  . Not on file  Social History Narrative   Patient lives at home with her spouse.   Caffeine Use: none   She just retired on August 10, 2017- worked in a [employer] firm for 59 years.   Right handed    Outpatient Encounter Medications as of 09/12/2018  Medication Sig  . Alum Hydroxide-Mag Carbonate (GAVISCON PO) Take 1 tablet by mouth as needed (reflux). As directed as needed  . aspirin 81 MG tablet Take 81 mg by mouth every other day.   . Cholecalciferol (VITAMIN D PO) Take 1,000 Units by mouth every other day.   . conjugated estrogens (PREMARIN)  vaginal cream Use 1 applicator full qhs x 1 week then one q 3-7 days PV  . Flaxseed, Linseed, (FLAXSEED OIL) OIL Take 1 capsule by mouth daily.   Marland Kitchen GLUCOSAMINE-CHONDROITIN-MSM-D3 PO Take 1 tablet by mouth 2 (two) times daily.  Marland Kitchen levothyroxine (SYNTHROID, LEVOTHROID) 100 MCG tablet TAKE 1  TABLET BY MOUTH EVERY DAY  . magnesium oxide (MAG-OX) 400 MG tablet Take 400 mg by mouth at bedtime.  . Multiple Vitamins-Minerals (ICAPS AREDS FORMULA PO) Take 1 capsule by mouth daily.    Marland Kitchen omeprazole-sodium bicarbonate (ZEGERID) 40-1100 MG capsule TAKE 1 CAPSULE BY MOUTH DAILY BEFORE BREAKFAST.  Marland Kitchen Pancrelipase, Lip-Prot-Amyl, (CREON) 24000-76000 units CPEP TAKE 1 CAPSULE (24,000 UNITS TOTAL) BY MOUTH 3 (THREE) TIMES DAILY.  Vladimir Faster Glycol-Propyl Glycol (SYSTANE ULTRA) 0.4-0.3 % SOLN Apply 1 drop to eye as needed (dry eyes).   . topiramate (TOPAMAX) 100 MG tablet Take 1 tablet (100 mg total) by mouth at bedtime.  . traMADol (ULTRAM) 50 MG tablet Take 100 mg by mouth at bedtime.   . vitamin C (ASCORBIC ACID) 500 MG tablet Take 500 mg by mouth daily.    Marland Kitchen zolpidem (AMBIEN) 10 MG tablet Take 1 tablet (10 mg total) by mouth at bedtime as needed for up to 30 days for sleep.  . [DISCONTINUED] cyanocobalamin (,VITAMIN B-12,) 1000 MCG/ML injection INJECT 1 ML INTO THE MUSCLE EVERY 14 DAYS  . [DISCONTINUED] fluticasone (FLOVENT HFA) 110 MCG/ACT inhaler Inhale 1 puff into the lungs 2 (two) times daily. (Patient not taking: Reported on 04/19/2018)  . [DISCONTINUED] OVER THE COUNTER MEDICATION Take 5 tablets by mouth 2 (two) times daily. Supplement - [facility]  . [DISCONTINUED] zolpidem (AMBIEN) 5 MG tablet TAKE 1 TABLET BY MOUTH AT BEDTIME   Facility-Administered Encounter Medications as of 09/12/2018  Medication  . 0.9 %  sodium chloride infusion    Activities of Daily Living In your present state of health, do you have any difficulty performing the following activities: 09/12/2018  Hearing? N  Vision?  N  Difficulty concentrating or making decisions? N  Walking or climbing stairs? N  Dressing or bathing? N  Doing errands, shopping? N  Preparing Food and eating ? N  Using the Toilet? N  In the past six months, have you accidently leaked urine? N  Do you have problems with loss of bowel control? N  Managing your Medications? N  Managing your Finances? N  Housekeeping or managing your Housekeeping? N  Some recent data might be hidden    Patient Care Team: Plotnikov, Evie Lacks, MD as PCP - General Joya Gaskins Burnett Harry, MD (Pulmonary Disease) [name], MD as Consulting Physician (Orthopedic Surgery) Penni Bombard, MD as Consulting Physician (Neurology)    Assessment:   This is a routine wellness examination for Syrai. Physical assessment deferred to PCP.  Exercise Activities and Dietary recommendations Current Exercise Habits: The patient does not participate in regular exercise at present, Exercise limited by: orthopedic condition(s)  Diet (meal preparation, eat out, water intake, caffeinated beverages, dairy products, fruits and vegetables): in general, a "healthy" diet  , well balanced. eats a variety of fruits and vegetables daily, limits salt, fat/cholesterol, sugar,carbohydrates,caffeine, drinks 6-8 glasses of water daily.   Goals    . Patient Stated     Continue to eat healthy and stay active to maintain my weight.         Fall Risk Fall Risk  09/12/2018 09/06/2017 07/08/2016 05/11/2015  Falls in the past year? 0 No No No  Comment - - Emmi Telephone Survey: data to providers prior to load -  Follow up Falls evaluation completed - - -    Depression Screen PHQ 2/9 Scores 09/12/2018 09/12/2018 09/06/2017  PHQ - 2 Score 1 0 0     Cognitive Function  Ad8 score reviewed for issues:  Issues making decisions: no  Less interest in hobbies / activities: no  Repeats questions, stories (family complaining): no  Trouble using ordinary gadgets (microwave,  computer, phone):no  Forgets the month or year: no  Mismanaging finances: no  Remembering appts: no  Daily problems with thinking and/or memory: no Ad8 score is= 0  Immunization History  Administered Date(s) Administered  . Influenza Split 08/31/2011, 05/25/2012  . Influenza Whole 08/26/2009  . Influenza, High Dose Seasonal PF 07/30/2018  . Influenza,inj,Quad PF,6+ Mos 05/05/2015, 04/27/2016  . Influenza-Unspecified 04/12/2013, 03/31/2014  . Pneumococcal Conjugate-13 07/19/2013  . Pneumococcal Polysaccharide-23 03/06/2009, 09/06/2017  . Td 03/05/2010  . Zoster 03/17/2006   Screening Tests Health Maintenance  Topic Date Due  . TETANUS/TDAP  03/05/2020  . INFLUENZA VACCINE  Completed  . DEXA SCAN  Completed  . PNA vac Low Risk Adult  Completed      Plan:   Reviewed health maintenance screenings with patient today and relevant education, vaccines, and/or referrals were provided.   Continue doing brain stimulating activities (puzzles, reading, adult coloring books, staying active) to keep memory sharp.   Continue to eat heart healthy diet (full of fruits, vegetables, whole grains, lean protein, water--limit salt, fat, and sugar intake) and increase physical activity as tolerated.  I have personally reviewed and noted the following in the patient's chart:   . Medical and social history . Use of alcohol, tobacco or illicit drugs  . Current medications and supplements . Functional ability and status . Nutritional status . Physical activity . Advanced directives . List of other physicians . Vitals . Screenings to include cognitive, depression, and falls . Referrals and appointments  In addition, I have reviewed and discussed with patient certain preventive protocols, quality metrics, and best practice recommendations. A written personalized care plan for preventive services as well as general preventive health recommendations were provided to patient.     Michiel Cowboy, RN  09/12/2018  Medical screening examination/treatment/procedure(s) were performed by non-physician practitioner and as supervising physician I was immediately available for consultation/collaboration. I agree with above. Lew Dawes, MD

## 2018-09-12 ENCOUNTER — Encounter: Payer: Self-pay | Admitting: Internal Medicine

## 2018-09-12 ENCOUNTER — Other Ambulatory Visit (INDEPENDENT_AMBULATORY_CARE_PROVIDER_SITE_OTHER): Payer: Medicare Other

## 2018-09-12 ENCOUNTER — Ambulatory Visit (INDEPENDENT_AMBULATORY_CARE_PROVIDER_SITE_OTHER): Payer: Medicare Other | Admitting: *Deleted

## 2018-09-12 ENCOUNTER — Ambulatory Visit (INDEPENDENT_AMBULATORY_CARE_PROVIDER_SITE_OTHER): Payer: Medicare Other | Admitting: Internal Medicine

## 2018-09-12 VITALS — BP 112/68 | HR 61 | Ht 63.0 in | Wt 146.0 lb

## 2018-09-12 VITALS — BP 112/68 | HR 61 | Temp 97.9°F | Ht 63.0 in | Wt 146.0 lb

## 2018-09-12 DIAGNOSIS — E559 Vitamin D deficiency, unspecified: Secondary | ICD-10-CM | POA: Diagnosis not present

## 2018-09-12 DIAGNOSIS — E785 Hyperlipidemia, unspecified: Secondary | ICD-10-CM | POA: Diagnosis not present

## 2018-09-12 DIAGNOSIS — E039 Hypothyroidism, unspecified: Secondary | ICD-10-CM

## 2018-09-12 DIAGNOSIS — Z0000 Encounter for general adult medical examination without abnormal findings: Secondary | ICD-10-CM | POA: Diagnosis not present

## 2018-09-12 DIAGNOSIS — E538 Deficiency of other specified B group vitamins: Secondary | ICD-10-CM

## 2018-09-12 DIAGNOSIS — F4321 Adjustment disorder with depressed mood: Secondary | ICD-10-CM

## 2018-09-12 LAB — URINALYSIS
Bilirubin Urine: NEGATIVE
Hgb urine dipstick: NEGATIVE
Ketones, ur: NEGATIVE
Leukocytes,Ua: NEGATIVE
Nitrite: NEGATIVE
Specific Gravity, Urine: 1.015 (ref 1.000–1.030)
Total Protein, Urine: NEGATIVE
URINE GLUCOSE: NEGATIVE
Urobilinogen, UA: 0.2 (ref 0.0–1.0)
pH: 7.5 (ref 5.0–8.0)

## 2018-09-12 LAB — HEPATIC FUNCTION PANEL
ALT: 29 U/L (ref 0–35)
AST: 27 U/L (ref 0–37)
Albumin: 4.4 g/dL (ref 3.5–5.2)
Alkaline Phosphatase: 56 U/L (ref 39–117)
BILIRUBIN TOTAL: 0.4 mg/dL (ref 0.2–1.2)
Bilirubin, Direct: 0.1 mg/dL (ref 0.0–0.3)
Total Protein: 7.5 g/dL (ref 6.0–8.3)

## 2018-09-12 LAB — TSH: TSH: 1.41 u[IU]/mL (ref 0.35–4.50)

## 2018-09-12 LAB — BASIC METABOLIC PANEL
BUN: 19 mg/dL (ref 6–23)
CO2: 25 mEq/L (ref 19–32)
Calcium: 9.5 mg/dL (ref 8.4–10.5)
Chloride: 105 mEq/L (ref 96–112)
Creatinine, Ser: 0.8 mg/dL (ref 0.40–1.20)
GFR: 69.05 mL/min (ref >60.00)
Glucose, Bld: 86 mg/dL (ref 70–99)
Potassium: 4.2 mEq/L (ref 3.5–5.1)
Sodium: 141 mEq/L (ref 135–145)

## 2018-09-12 LAB — CBC WITH DIFFERENTIAL/PLATELET
BASOS PCT: 0.7 % (ref 0.0–3.0)
Basophils Absolute: 0.1 10*3/uL (ref 0.0–0.1)
Eosinophils Absolute: 0.2 10*3/uL (ref 0.0–0.7)
Eosinophils Relative: 3.1 % (ref 0.0–5.0)
HCT: 40.1 % (ref 36.0–46.0)
Hemoglobin: 13.3 g/dL (ref 12.0–15.0)
Lymphocytes Relative: 36.4 % (ref 12.0–46.0)
Lymphs Abs: 2.7 10*3/uL (ref 0.7–4.0)
MCHC: 33.2 g/dL (ref 30.0–36.0)
MCV: 88.7 fl (ref 78.0–100.0)
Monocytes Absolute: 0.7 10*3/uL (ref 0.1–1.0)
Monocytes Relative: 10 % (ref 3.0–12.0)
Neutro Abs: 3.7 10*3/uL (ref 1.4–7.7)
Neutrophils Relative %: 49.8 % (ref 43.0–77.0)
Platelets: 324 10*3/uL (ref 150.0–400.0)
RBC: 4.52 Mil/uL (ref 3.87–5.11)
RDW: 13.6 % (ref 11.5–15.5)
WBC: 7.4 10*3/uL (ref 4.0–10.5)

## 2018-09-12 LAB — LIPID PANEL
Cholesterol: 150 mg/dL (ref 0–200)
HDL: 55.3 mg/dL (ref >39.00)
LDL Cholesterol: 74 mg/dL (ref 0–99)
NonHDL: 94.88
Total CHOL/HDL Ratio: 3
Triglycerides: 106 mg/dL (ref 0.0–149.0)
VLDL: 21.2 mg/dL (ref 0.0–40.0)

## 2018-09-12 MED ORDER — ZOLPIDEM TARTRATE 10 MG PO TABS: 10.0000 mg | ORAL_TABLET | Freq: Every evening | ORAL | 1 refills | Status: DC | PRN

## 2018-09-12 MED ORDER — ZOSTER VAC RECOMB ADJUVANTED 50 MCG/0.5ML IM SUSR: 0.5000 mL | Freq: Once | INTRAMUSCULAR | 1 refills | Status: AC

## 2018-09-12 MED ORDER — CYANOCOBALAMIN 1000 MCG/ML IJ SOLN: 1000.0000 ug | INTRAMUSCULAR | 3 refills | Status: DC

## 2018-09-12 NOTE — Patient Instructions (Addendum)
Cardiac CT calcium scoring test $150   Computed tomography, more commonly known as a CT or CAT scan, is a diagnostic medical imaging test. Like traditional x-rays, it produces multiple images or pictures of the inside of the body. The cross-sectional images generated during a CT scan can be reformatted in multiple planes. They can even generate three-dimensional images. These images can be viewed on a computer monitor, printed on film or by a 3D printer, or transferred to a CD or DVD. CT images of internal organs, bones, soft tissue and blood vessels provide greater detail than traditional x-rays, particularly of soft tissues and blood vessels. A cardiac CT scan for coronary calcium is a non-invasive way of obtaining information about the presence, location and extent of calcified plaque in the coronary arteries-the vessels that supply oxygen-containing blood to the heart muscle. Calcified plaque results when there is a build-up of fat and other substances under the inner layer of the artery. This material can calcify which signals the presence of atherosclerosis, a disease of the vessel wall, also called coronary artery disease (CAD). People with this disease have an increased risk for heart attacks. In addition, over time, progression of plaque build up (CAD) can narrow the arteries or even close off blood flow to the heart. The result may be chest pain, sometimes called "angina," or a heart attack. Because calcium is a marker of CAD, the amount of calcium detected on a cardiac CT scan is a helpful prognostic tool. The findings on cardiac CT are expressed as a calcium score. Another name for this test is coronary artery calcium scoring.  What are some common uses of the procedure? The goal of cardiac CT scan for calcium scoring is to determine if CAD is present and to what extent, even if there are no symptoms. It is a screening study that may be recommended by a physician for patients with risk factors  for CAD but no clinical symptoms. The major risk factors for CAD are: . high blood cholesterol levels  . family history of heart attacks  . diabetes  . high blood pressure  . cigarette smoking  . overweight or obese  . physical inactivity   A negative cardiac CT scan for calcium scoring shows no calcification within the coronary arteries. This suggests that CAD is absent or so minimal it cannot be seen by this technique. The chance of having a heart attack over the next two to five years is very low under these circumstances. A positive test means that CAD is present, regardless of whether or not the patient is experiencing any symptoms. The amount of calcification-expressed as the calcium score-may help to predict the likelihood of a myocardial infarction (heart attack) in the coming years and helps your medical doctor or cardiologist decide whether the patient may need to take preventive medicine or undertake other measures such as diet and exercise to lower the risk for heart attack. The extent of CAD is graded according to your calcium score:  Calcium Score  Presence of CAD  0 No evidence of CAD   1-10 Minimal evidence of CAD  11-100 Mild evidence of CAD  101-400 Moderate evidence of CAD  Over 400 Extensive evidence of CAD         If you have medicare related insurance (such as traditional Medicare, Blue Cross Medicare, United HealthCare Medicare, or similar), Please make an appointment at the scheduling desk with Jill, the Wellness Health Coach, for your Wellness visit in this office,   which is a benefit with your insurance.  

## 2018-09-12 NOTE — Progress Notes (Signed)
Subjective:  Patient ID: Elizabeth Gray, female    DOB: 08/14/1940  Age: 78 y.o. MRN: [id number]  CC: No chief complaint on file.   HPI Elizabeth Gray presents for insomnia - worse, grieving, B 12 def f/u Pt lost wt on diet   Outpatient Medications Prior to Visit  Medication Sig Dispense Refill  . Alum Hydroxide-Mag Carbonate (GAVISCON PO) Take 1 tablet by mouth as needed (reflux). As directed as needed    . aspirin 81 MG tablet Take 81 mg by mouth every other day.     . Cholecalciferol (VITAMIN D PO) Take 1,000 Units by mouth every other day.     . conjugated estrogens (PREMARIN) vaginal cream Use 1 applicator full qhs x 1 week then one q 3-7 days PV 42.5 g 11  . cyanocobalamin (,VITAMIN B-12,) 1000 MCG/ML injection INJECT 1 ML INTO THE MUSCLE EVERY 14 DAYS 10 mL 3  . Flaxseed, Linseed, (FLAXSEED OIL) OIL Take 1 capsule by mouth daily.     Marland Kitchen GLUCOSAMINE-CHONDROITIN-MSM-D3 PO Take 1 tablet by mouth 2 (two) times daily.    Marland Kitchen levothyroxine (SYNTHROID, LEVOTHROID) 100 MCG tablet TAKE 1 TABLET BY MOUTH EVERY DAY 90 tablet 1  . magnesium oxide (MAG-OX) 400 MG tablet Take 400 mg by mouth at bedtime.    . Multiple Vitamins-Minerals (ICAPS AREDS FORMULA PO) Take 1 capsule by mouth daily.      Marland Kitchen omeprazole-sodium bicarbonate (ZEGERID) 40-1100 MG capsule TAKE 1 CAPSULE BY MOUTH DAILY BEFORE BREAKFAST. 90 capsule 3  . Pancrelipase, Lip-Prot-Amyl, (CREON) 24000-76000 units CPEP TAKE 1 CAPSULE (24,000 UNITS TOTAL) BY MOUTH 3 (THREE) TIMES DAILY. 270 capsule 11  . Polyethyl Glycol-Propyl Glycol (SYSTANE ULTRA) 0.4-0.3 % SOLN Apply 1 drop to eye as needed (dry eyes).     . topiramate (TOPAMAX) 100 MG tablet Take 1 tablet (100 mg total) by mouth at bedtime. 90 tablet 4  . traMADol (ULTRAM) 50 MG tablet Take 100 mg by mouth at bedtime.     . vitamin C (ASCORBIC ACID) 500 MG tablet Take 500 mg by mouth daily.      Marland Kitchen zolpidem (AMBIEN) 5 MG tablet TAKE 1 TABLET BY MOUTH AT BEDTIME 60 tablet 5  .  fluticasone (FLOVENT HFA) 110 MCG/ACT inhaler Inhale 1 puff into the lungs 2 (two) times daily. (Patient not taking: Reported on 04/19/2018) 1 Inhaler 5  . OVER THE COUNTER MEDICATION Take 5 tablets by mouth 2 (two) times daily. Supplement - Elizabeth Gray     Facility-Administered Medications Prior to Visit  Medication Dose Route Frequency Provider Last Rate Last Dose  . 0.9 %  sodium chloride infusion  500 mL Intravenous Once Gatha Mayer, MD        ROS: Review of Systems  Constitutional: Negative for activity change, appetite change, chills, fatigue and unexpected weight change.  HENT: Negative for congestion, mouth sores and sinus pressure.   Eyes: Negative for visual disturbance.  Respiratory: Negative for cough and chest tightness.   Gastrointestinal: Negative for abdominal pain and nausea.  Genitourinary: Negative for difficulty urinating, frequency and vaginal pain.  Musculoskeletal: Negative for back pain and gait problem.  Skin: Negative for pallor and rash.  Neurological: Negative for dizziness, tremors, weakness, numbness and headaches.  Psychiatric/Behavioral: Positive for sleep disturbance. Negative for confusion and suicidal ideas.    Objective:  BP 112/68 (BP Location: Left Arm, Patient Position: Sitting, Cuff Size: Normal)   Pulse 61   Temp 97.9 F (36.6  C) (Oral)   Ht 5\' 3"  (1.6 m)   Wt 146 lb (66.2 kg)   SpO2 96%   BMI 25.86 kg/m   BP Readings from Last 3 Encounters:  09/12/18 112/68  05/01/18 (!) 112/58  03/20/18 130/67    Wt Readings from Last 3 Encounters:  09/12/18 146 lb (66.2 kg)  05/01/18 154 lb (69.9 kg)  04/19/18 154 lb 3.2 oz (69.9 kg)    Physical Exam Constitutional:      General: She is not in acute distress.    Appearance: She is well-developed.  HENT:     Head: Normocephalic.     Right Ear: External ear normal.     Left Ear: External ear normal.     Nose: Nose normal.  Eyes:     General:        Right eye: No discharge.         Left eye: No discharge.     Conjunctiva/sclera: Conjunctivae normal.     Pupils: Pupils are equal, round, and reactive to light.  Neck:     Musculoskeletal: Normal range of motion and neck supple.     Thyroid: No thyromegaly.     Vascular: No JVD.     Trachea: No tracheal deviation.  Cardiovascular:     Rate and Rhythm: Normal rate and regular rhythm.     Heart sounds: Normal heart sounds.  Pulmonary:     Effort: No respiratory distress.     Breath sounds: No stridor. No wheezing.  Abdominal:     General: Bowel sounds are normal. There is no distension.     Palpations: Abdomen is soft. There is no mass.     Tenderness: There is no abdominal tenderness. There is no guarding or rebound.  Musculoskeletal:        General: No tenderness.  Lymphadenopathy:     Cervical: No cervical adenopathy.  Skin:    Findings: No erythema or rash.  Neurological:     Cranial Nerves: No cranial nerve deficit.     Motor: No abnormal muscle tone.     Coordination: Coordination normal.     Deep Tendon Reflexes: Reflexes normal.  Psychiatric:        Behavior: Behavior normal.        Thought Content: Thought content normal.        Judgment: Judgment normal.     Lab Results  Component Value Date   WBC 11.1 (H) 09/06/2017   HGB 13.4 09/06/2017   HCT 40.5 09/06/2017   PLT 319.0 09/06/2017   GLUCOSE 84 09/06/2017   CHOL 158 09/06/2017   TRIG 118.0 09/06/2017   HDL 75.90 09/06/2017   LDLCALC 59 09/06/2017   ALT 37 (H) 09/06/2017   AST 26 09/06/2017   NA 140 09/06/2017   K 3.9 09/06/2017   CL 104 09/06/2017   CREATININE 0.88 09/06/2017   BUN 19 09/06/2017   CO2 31 09/06/2017   TSH 1.65 09/06/2017   INR 3.03 (H) 05/12/2014    Dg Esophagus  Result Date: 10/14/2016 CLINICAL DATA:  Dysphagia.  Pills and food intermittently gets stuck EXAM: ESOPHOGRAM / BARIUM SWALLOW / BARIUM TABLET STUDY TECHNIQUE: Combined double contrast and single contrast examination performed using effervescent  crystals, thick barium liquid, and thin barium liquid. The patient was observed with fluoroscopy swallowing a 13 mm barium sulphate tablet. FLUOROSCOPY TIME:  Fluoroscopy Time:  1 minutes 24 seconds Radiation Exposure Index (if provided by the fluoroscopic device): 6.7 mGy Number of Acquired Spot  Images: 0 COMPARISON:  None available (08/14/2002) FINDINGS: Lateral pharyngeal imaging is normal. No obstructive process, aspiration, or diverticulum. The esophagus has normal mucosal contour and motility. There is a small and transient hiatal hernia with intermittently seen somewhat broad but smooth ring at the GE junction. This caused transient delay in passage of a 13 mm barium tablet. IMPRESSION: Lower esophageal ring above a small transient hiatal hernia. The ring had a transient appearance, favored muscular. There was only mild delay in passage of a 13 mm barium tablet. Electronically Signed   By: Monte Fantasia M.D.   On: 10/14/2016 09:36    Assessment & Plan:   There are no diagnoses linked to this encounter.   No orders of the defined types were placed in this encounter.    Follow-up: No follow-ups on file.  Walker Kehr, MD

## 2018-09-12 NOTE — Assessment & Plan Note (Signed)
Husband [name] died in Nov 2019 at 89 Discussed

## 2018-09-12 NOTE — Assessment & Plan Note (Signed)
On B12 

## 2018-09-12 NOTE — Assessment & Plan Note (Signed)
Vit D 

## 2018-09-12 NOTE — Patient Instructions (Addendum)
Continue doing brain stimulating activities (puzzles, reading, adult coloring books, staying active) to keep memory sharp.   Continue to eat heart healthy diet (full of fruits, vegetables, whole grains, lean protein, water--limit salt, fat, and sugar intake) and increase physical activity as tolerated.   [name] , Thank you for taking time to come for your Medicare Wellness Visit. I appreciate your ongoing commitment to your health goals. Please review the following plan we discussed and let me know if I can assist you in the future.   These are the goals we discussed: Goals    . Patient Stated     Continue to eat healthy and stay active to maintain my weight.         This is a list of the screening recommended for you and due dates:  Health Maintenance  Topic Date Due  . Tetanus Vaccine  03/05/2020  . Flu Shot  Completed  . DEXA scan (bone density measurement)  Completed  . Pneumonia vaccines  Completed   Health Maintenance, Female Adopting a healthy lifestyle and getting preventive care can go a long way to promote health and wellness. Talk with your health care provider about what schedule of regular examinations is right for you. This is a good chance for you to check in with your provider about disease prevention and staying healthy. In between checkups, there are plenty of things you can do on your own. Experts have done a lot of research about which lifestyle changes and preventive measures are most likely to keep you healthy. Ask your health care provider for more information. Weight and diet Eat a healthy diet  Be sure to include plenty of vegetables, fruits, low-fat dairy products, and lean protein.  Do not eat a lot of foods high in solid fats, added sugars, or salt.  Get regular exercise. This is one of the most important things you can do for your health. ? Most adults should exercise for at least 150 minutes each week. The exercise should increase your heart rate  and make you sweat (moderate-intensity exercise). ? Most adults should also do strengthening exercises at least twice a week. This is in addition to the moderate-intensity exercise. Maintain a healthy weight  Body mass index (BMI) is a measurement that can be used to identify possible weight problems. It estimates body fat based on height and weight. Your health care provider can help determine your BMI and help you achieve or maintain a healthy weight.  For females 86 years of age and older: ? A BMI below 18.5 is considered underweight. ? A BMI of 18.5 to 24.9 is normal. ? A BMI of 25 to 29.9 is considered overweight. ? A BMI of 30 and above is considered obese. Watch levels of cholesterol and blood lipids  You should start having your blood tested for lipids and cholesterol at 80 years of age, then have this test every 5 years.  You may need to have your cholesterol levels checked more often if: ? Your lipid or cholesterol levels are high. ? You are older than 80 years of age. ? You are at high risk for heart disease. Cancer screening Lung Cancer  Lung cancer screening is recommended for adults 106-29 years old who are at high risk for lung cancer because of a history of smoking.  A yearly low-dose CT scan of the lungs is recommended for people who: ? Currently smoke. ? Have quit within the past 15 years. ? Have at least  a 30-pack-year history of smoking. A pack year is smoking an average of one pack of cigarettes a day for 1 year.  Yearly screening should continue until it has been 15 years since you quit.  Yearly screening should stop if you develop a health problem that would prevent you from having lung cancer treatment. Breast Cancer  Practice breast self-awareness. This means understanding how your breasts normally appear and feel.  It also means doing regular breast self-exams. Let your health care provider know about any changes, no matter how small.  If you are in your  20s or 30s, you should have a clinical breast exam (CBE) by a health care provider every 1-3 years as part of a regular health exam.  If you are 76 or older, have a CBE every year. Also consider having a breast X-ray (mammogram) every year.  If you have a family history of breast cancer, talk to your health care provider about genetic screening.  If you are at high risk for breast cancer, talk to your health care provider about having an MRI and a mammogram every year.  Breast cancer gene (BRCA) assessment is recommended for women who have family members with BRCA-related cancers. BRCA-related cancers include: ? Breast. ? Ovarian. ? Tubal. ? Peritoneal cancers.  Results of the assessment will determine the need for genetic counseling and BRCA1 and BRCA2 testing. Cervical Cancer Your health care provider may recommend that you be screened regularly for cancer of the pelvic organs (ovaries, uterus, and vagina). This screening involves a pelvic examination, including checking for microscopic changes to the surface of your cervix (Pap test). You may be encouraged to have this screening done every 3 years, beginning at age 62.  For women ages 65-65, health care providers may recommend pelvic exams and Pap testing every 3 years, or they may recommend the Pap and pelvic exam, combined with testing for human papilloma virus (HPV), every 5 years. Some types of HPV increase your risk of cervical cancer. Testing for HPV may also be done on women of any age with unclear Pap test results.  Other health care providers may not recommend any screening for nonpregnant women who are considered low risk for pelvic cancer and who do not have symptoms. Ask your health care provider if a screening pelvic exam is right for you.  If you have had past treatment for cervical cancer or a condition that could lead to cancer, you need Pap tests and screening for cancer for at least 20 years after your treatment. If Pap  tests have been discontinued, your risk factors (such as having a new sexual partner) need to be reassessed to determine if screening should resume. Some women have medical problems that increase the chance of getting cervical cancer. In these cases, your health care provider may recommend more frequent screening and Pap tests. Colorectal Cancer  This type of cancer can be detected and often prevented.  Routine colorectal cancer screening usually begins at 80 years of age and continues through 80 years of age.  Your health care provider may recommend screening at an earlier age if you have risk factors for colon cancer.  Your health care provider may also recommend using home test kits to check for hidden blood in the stool.  A small camera at the end of a tube can be used to examine your colon directly (sigmoidoscopy or colonoscopy). This is done to check for the earliest forms of colorectal cancer.  Routine screening usually begins  at age 43.  Direct examination of the colon should be repeated every 5-10 years through 80 years of age. However, you may need to be screened more often if early forms of precancerous polyps or small growths are found. Skin Cancer  Check your skin from head to toe regularly.  Tell your health care provider about any new moles or changes in moles, especially if there is a change in a mole's shape or color.  Also tell your health care provider if you have a mole that is larger than the size of a pencil eraser.  Always use sunscreen. Apply sunscreen liberally and repeatedly throughout the day.  Protect yourself by wearing long sleeves, pants, a wide-brimmed hat, and sunglasses whenever you are outside. Heart disease, diabetes, and high blood pressure  High blood pressure causes heart disease and increases the risk of stroke. High blood pressure is more likely to develop in: ? People who have blood pressure in the high end of the normal range (130-139/85-89 mm  Hg). ? People who are overweight or obese. ? People who are African American.  If you are 9-75 years of age, have your blood pressure checked every 3-5 years. If you are 33 years of age or older, have your blood pressure checked every year. You should have your blood pressure measured twice-once when you are at a hospital or clinic, and once when you are not at a hospital or clinic. Record the average of the two measurements. To check your blood pressure when you are not at a hospital or clinic, you can use: ? An automated blood pressure machine at a pharmacy. ? A home blood pressure monitor.  If you are between 27 years and 84 years old, ask your health care provider if you should take aspirin to prevent strokes.  Have regular diabetes screenings. This involves taking a blood sample to check your fasting blood sugar level. ? If you are at a normal weight and have a low risk for diabetes, have this test once every three years after 80 years of age. ? If you are overweight and have a high risk for diabetes, consider being tested at a younger age or more often. Preventing infection Hepatitis B  If you have a higher risk for hepatitis B, you should be screened for this virus. You are considered at high risk for hepatitis B if: ? You were born in a country where hepatitis B is common. Ask your health care provider which countries are considered high risk. ? Your parents were born in a high-risk country, and you have not been immunized against hepatitis B (hepatitis B vaccine). ? You have HIV or AIDS. ? You use needles to inject street drugs. ? You live with someone who has hepatitis B. ? You have had sex with someone who has hepatitis B. ? You get hemodialysis treatment. ? You take certain medicines for conditions, including cancer, organ transplantation, and autoimmune conditions. Hepatitis C  Blood testing is recommended for: ? Everyone born from [birth year] through 1967. ? Anyone with known  risk factors for hepatitis C. Sexually transmitted infections (STIs)  You should be screened for sexually transmitted infections (STIs) including gonorrhea and chlamydia if: ? You are sexually active and are younger than 80 years of age. ? You are older than 80 years of age and your health care provider tells you that you are at risk for this type of infection. ? Your sexual activity has changed since you were last screened and  you are at an increased risk for chlamydia or gonorrhea. Ask your health care provider if you are at risk.  If you do not have HIV, but are at risk, it may be recommended that you take a prescription medicine daily to prevent HIV infection. This is called pre-exposure prophylaxis (PrEP). You are considered at risk if: ? You are sexually active and do not regularly use condoms or know the HIV status of your partner(s). ? You take drugs by injection. ? You are sexually active with a partner who has HIV. Talk with your health care provider about whether you are at high risk of being infected with HIV. If you choose to begin PrEP, you should first be tested for HIV. You should then be tested every 3 months for as long as you are taking PrEP. Pregnancy  If you are premenopausal and you may become pregnant, ask your health care provider about preconception counseling.  If you may become pregnant, take 400 to 800 micrograms (mcg) of folic acid every day.  If you want to prevent pregnancy, talk to your health care provider about birth control (contraception). Osteoporosis and menopause  Osteoporosis is a disease in which the bones lose minerals and strength with aging. This can result in serious bone fractures. Your risk for osteoporosis can be identified using a bone density scan.  If you are 67 years of age or older, or if you are at risk for osteoporosis and fractures, ask your health care provider if you should be screened.  Ask your health care provider whether you  should take a calcium or vitamin D supplement to lower your risk for osteoporosis.  Menopause may have certain physical symptoms and risks.  Hormone replacement therapy may reduce some of these symptoms and risks. Talk to your health care provider about whether hormone replacement therapy is right for you. Follow these instructions at home:  Schedule regular health, dental, and eye exams.  Stay current with your immunizations.  Do not use any tobacco products including cigarettes, chewing tobacco, or electronic cigarettes.  If you are pregnant, do not drink alcohol.  If you are breastfeeding, limit how much and how often you drink alcohol.  Limit alcohol intake to no more than 1 drink per day for nonpregnant women. One drink equals 12 ounces of beer, 5 ounces of [place], or 1 ounces of hard liquor.  Do not use street drugs.  Do not share needles.  Ask your health care provider for help if you need support or information about quitting drugs.  Tell your health care provider if you often feel depressed.  Tell your health care provider if you have ever been abused or do not feel safe at home. This information is not intended to replace advice given to you by your health care provider. Make sure you discuss any questions you have with your health care provider. Document Released: 01/31/2011 Document Revised: 12/24/2015 Document Reviewed: 04/21/2015 Elsevier Interactive Patient Education  2019 [employer].

## 2018-09-12 NOTE — Assessment & Plan Note (Signed)
On Levothroid 

## 2018-10-01 DIAGNOSIS — Z01419 Encounter for gynecological examination (general) (routine) without abnormal findings: Secondary | ICD-10-CM | POA: Diagnosis not present

## 2018-10-01 DIAGNOSIS — Z124 Encounter for screening for malignant neoplasm of cervix: Secondary | ICD-10-CM | POA: Diagnosis not present

## 2018-10-02 DIAGNOSIS — Z803 Family history of malignant neoplasm of breast: Secondary | ICD-10-CM | POA: Diagnosis not present

## 2018-10-02 DIAGNOSIS — Z96651 Presence of right artificial knee joint: Secondary | ICD-10-CM | POA: Diagnosis not present

## 2018-10-02 DIAGNOSIS — R634 Abnormal weight loss: Secondary | ICD-10-CM | POA: Diagnosis not present

## 2018-10-02 DIAGNOSIS — Z1231 Encounter for screening mammogram for malignant neoplasm of breast: Secondary | ICD-10-CM | POA: Diagnosis not present

## 2018-10-02 DIAGNOSIS — Z8262 Family history of osteoporosis: Secondary | ICD-10-CM | POA: Diagnosis not present

## 2018-10-02 DIAGNOSIS — M8589 Other specified disorders of bone density and structure, multiple sites: Secondary | ICD-10-CM | POA: Diagnosis not present

## 2018-10-02 DIAGNOSIS — D869 Sarcoidosis, unspecified: Secondary | ICD-10-CM | POA: Diagnosis not present

## 2018-10-02 LAB — HM MAMMOGRAPHY

## 2018-10-02 LAB — HM DEXA SCAN: HM Dexa Scan: -2.4

## 2018-10-08 ENCOUNTER — Encounter: Payer: Self-pay | Admitting: Internal Medicine

## 2018-10-17 ENCOUNTER — Inpatient Hospital Stay: Admission: RE | Admit: 2018-10-17 | Payer: Medicare Other | Source: Ambulatory Visit

## 2018-10-23 ENCOUNTER — Telehealth (HOSPITAL_COMMUNITY): Payer: Self-pay | Admitting: *Deleted

## 2018-10-23 NOTE — Telephone Encounter (Signed)
Spoke with patient about cancelling CT. Patient thank me for call.

## 2018-11-05 ENCOUNTER — Inpatient Hospital Stay: Admission: RE | Admit: 2018-11-05 | Payer: Medicare Other | Source: Ambulatory Visit

## 2018-11-10 ENCOUNTER — Other Ambulatory Visit: Payer: Self-pay | Admitting: Internal Medicine

## 2018-12-04 ENCOUNTER — Telehealth: Payer: Self-pay

## 2018-12-04 DIAGNOSIS — G44309 Post-traumatic headache, unspecified, not intractable: Secondary | ICD-10-CM | POA: Insufficient documentation

## 2018-12-04 NOTE — Progress Notes (Signed)
PATIENT: Elizabeth Gray DOB: 11-07-1940  REASON FOR VISIT: follow up HISTORY FROM: patient  Virtual Visit via Telephone Note  I connected with Elizabeth Gray on 12/05/18 at  3:00 PM EDT by telephone and verified that I am speaking with the correct person using two identifiers.   I discussed the limitations, risks, security and privacy concerns of performing an evaluation and management service by telephone and the availability of in person appointments. I also discussed with the patient that there may be a patient responsible charge related to this service. The patient expressed understanding and agreed to proceed.   History of Present Illness:  12/05/18 Elizabeth Gray is a 78 y.o. female for follow up of post traumatic headache.  [name] continues topiramate 100 mg at bedtime.  She states that her headaches are very well controlled.  She may have 4-5 bad headaches in a year.  She reports that her husband passed away this past 07/24/23.  She is doing well otherwise.    HISTORY (copied from Dr Gladstone Lighter note on 12/04/2017)  UPDATE (12/04/17, VRP): Since last visit, doing about the same. Tolerating TPX. No alleviating or aggravating factors. Continues with HA and nausea. Still with caregiver stress related to ailing health of her husband. Also with poor sleep quality.   UPDATE 05/13/16: Since last visit, HA are stable. Mild dull HA today (? Weather related). Sleep apnea stable on CPAP. Tolerating TPX. Some new left leg swelling in afternoons.   UPDATE 05/11/15: Since last visit, overall stable. Sometimes with standing or moving too quickly, triggers vertigo attacks. For now, symptoms are mild.   UPDATE 11/10/14: Since last visit, doing much better. HA are much improved. On TPX 100mg  qhs. Prednisone course seemed to have helped.  UPDATE 09/19/14: Since last visit patient was doing well. She had knee replacement surgery in October 2015. Following this patient had forgot to  restart topiramate. She continues to do well without headaches. Unfortunately 08/28/2014, patient had a fall when she was helping unload wood from pickup truck, fell backwards and struck her head. She had pain in the back of her head. She was able to finish her work. 2 days later she had recurrence of her prior migraine headaches. She describes left frontal throbbing severe headaches with sensitivity to light and sound. She has been taking Tylenol arthritis strength, 2 tabs twice a day for several weeks. She is also taking topiramate 100 mg twice a day again. This week headaches were slightly better. She rates headaches as 5 out of 10. Also a few days ago patient noticed intermittent tingling in her right hand fingertips, digits 2, 3, 5. Symptoms last 10-20 minutes at a time. She also noticed some discoloration in her right third digit distally, lasting a few minutes. Patient also having some intermittent left-sided neck pain, radiating to left arm.  UPDATE 03/10/14 (LL): Patient is tolerating tinnitus with TPX 100 mg bid for less frequent, less severe headaches. No other known side effects. Pleased with current treatment. Planning right total knee replacement in October.  UPDATE 09/09/13: Since last visit, was doing well until she developed tinnitus. Her PCP advised her to reduce TPX. She reduced to 50mg  BID, then to 25mg  BID. Tinnitus improved, but then HA worsened. Now back to 100mg  BID, but tinnitus has returned. 2 weeks ago had migraine (HA + nausea), took tylenol and went to sleep with resolution of HA.   UPDATE 09/07/12: Was doing better, then more HA x last 2 months.  Not sleeping well at night. Wakes up multiple times to check on husband (who falls asleep in chair). Not much physical activity. HA are similar quality as before.   UPDATE 03/05/12: Doing better. No headaches since last visit. She has had noticed floaters that appear like a feather; notices in am when she may not have had enough sleep, last  episode 2 weeks ago. She has a history of floaters since age 42. Tolerating TPX 100mg  qhs without any memory or focus problems or numbness/tingling.   UPDATE 10/31/11: Doing little bit better. On TPX 100mg  qhs; couldn't tolerate BID dosing. No side effects. Sinus pressure sensation has improved.   PRIOR HPI: 78 year old ambidextrous, right dominant, female with history of pulmonary sarcoidosis, obstructive sleep apnea, osteoarthritis, here for evaluation of headaches since October 2012. Patient reports feeling sinus infection symptoms and frontal headache in October 2012. She was prescribed azithromycin. Her symptoms did not improve. She was then referred to ENT, had CT of the sinuses, which showed no significant sinus disease. For the past 2 months she's been taking tramadol and ibuprofen on a daily basis for knee pain. At the onset of the headache symptom she was taking Sudafed but no other pain medications.  Patient reports pressure and mild throbbing sensation in the bifrontal and bitemporal regions. No nausea, vomiting, photophobia or visual scotoma. Sometimes she has mild blurred vision and phonophobia with her headaches. Headache severity is 2/10 on a daily basis, sometimes increased to 4 or 5/10 once a week.    Observations/Objective:  Generalized: Well developed, in no acute distress  Mentation: Alert oriented to time, place, history taking. Follows all commands speech and language fluent   Assessment and Plan:  78 y.o. year old female  has a past medical history of Allergy, Bursitis, Cancer ([place]) (03/22/2017), Deviated septum, External hemorrhoids without mention of complication (4696), Fatigue, GERD (gastroesophageal reflux disease), Headache(784.0), Heat rash, Hypothyroidism, Iron deficiency anemia, unspecified, Lower esophageal ring (2008), Nonorganic sleep disorder, unspecified, Osteoarthritis, Osteoporosis, Pancreatitis, Personal history of colonic polyps, Polio (1948), Pulmonary  sarcoidosis ([place]), Sleep apnea, Vitamin B12 deficiency, and Vitamin D deficiency. with    ICD-10-CM   1. Post-traumatic headache, not intractable, unspecified chronicity pattern G44.309    Ms. Nile continues to do well with topiramate 100 mg at bedtime.  I have expressed my condolences for the loss of her husband.  She does feel that she is under less stress now.  We will continue current therapy.  I have advised that she may follow-up with her PCP for continued maintenance, however, she prefers to continue follow-up with neurology.  I have advised annual follow-up.  She verbalizes understanding and agreement with this plan.  No orders of the defined types were placed in this encounter.   Meds ordered this encounter  Medications   topiramate (TOPAMAX) 100 MG tablet    Sig: Take 1 tablet (100 mg total) by mouth at bedtime.    Dispense:  90 tablet    Refill:  4    Order Specific Question:   Supervising Provider    Answer:   Melvenia Beam [id number]     Follow Up Instructions:  I discussed the assessment and treatment plan with the patient. The patient was provided an opportunity to ask questions and all were answered. The patient agreed with the plan and demonstrated an understanding of the instructions.   The patient was advised to call back or seek an in-person evaluation if the symptoms worsen or if the condition fails  to improve as anticipated.  I provided 20 minutes of non-face-to-face time during this encounter. Patient is located at her place of residence during video conference.  Provider is located at her place of residence.  Liane Comber, RN helped to facilitate visit.   Debbora Presto, NP

## 2018-12-04 NOTE — Telephone Encounter (Signed)
Spoke with the patient and I was able to schedule her a doxy.me visit with Debbora Presto, NP on 12/05/2018 at 3 pm. E-mail, mobile number and carrier have been confirmed and sent.  E-mail: [email or web address] Text: [phone] Youth worker)

## 2018-12-05 ENCOUNTER — Ambulatory Visit (INDEPENDENT_AMBULATORY_CARE_PROVIDER_SITE_OTHER): Payer: Medicare Other | Admitting: Family Medicine

## 2018-12-05 ENCOUNTER — Other Ambulatory Visit: Payer: Self-pay

## 2018-12-05 ENCOUNTER — Encounter: Payer: Self-pay | Admitting: Family Medicine

## 2018-12-05 DIAGNOSIS — G44309 Post-traumatic headache, unspecified, not intractable: Secondary | ICD-10-CM

## 2018-12-05 MED ORDER — TOPIRAMATE 100 MG PO TABS: 100.0000 mg | ORAL_TABLET | Freq: Every day | ORAL | 4 refills | Status: DC

## 2018-12-05 NOTE — Progress Notes (Signed)
I reviewed note and agree with plan.   Penni Bombard, MD 08/06/1094, 0:45 PM Certified in Neurology, Neurophysiology and Neuroimaging  [facility] [address], [place], [place] [address] [phone]

## 2018-12-17 DIAGNOSIS — H40003 Preglaucoma, unspecified, bilateral: Secondary | ICD-10-CM | POA: Diagnosis not present

## 2018-12-17 DIAGNOSIS — H25813 Combined forms of age-related cataract, bilateral: Secondary | ICD-10-CM | POA: Diagnosis not present

## 2018-12-17 DIAGNOSIS — H353131 Nonexudative age-related macular degeneration, bilateral, early dry stage: Secondary | ICD-10-CM | POA: Diagnosis not present

## 2018-12-31 ENCOUNTER — Telehealth: Payer: Self-pay | Admitting: *Deleted

## 2018-12-31 NOTE — Telephone Encounter (Signed)
Script Screening patients for COVID-19 and reviewing new operational procedures  Greeting - The reason I am calling is to share with you some new changes to our processes that are designed to help us keep everyone safe. Is now a good time to speak with you? Patient says "no' - ask them when you can call back and let them know it's important to do this prior to their appointment.  Patient says "yes" - Great, Pat the first thing I need to do is ask you some screening Questions.  1. To the best of your knowledge, have you been in close contact with any one with a confirmed diagnosis of COVID 19? o No - proceed to next question  2. Have you had any one or more of the following: fever, chills, cough, shortness of breath or any flu-like symptoms? o No - proceed to next question  3. Have you been diagnosed with or have a previous diagnosis of COVID 19? o No - proceed to next question  4. I am going to go over a few other symptoms with you. Please let me know if you are experiencing any of the following: . Ear, nose or throat discomfort . A sore throat . Headache . Muscle pain . Diarrhea . Loss of taste or smell o No - proceed to next question  Thank you for answering these questions. Please know we will ask you these questions or similar questions when you arrive for your appointment and again it's how we are keeping everyone safe. Also, to keep you safe, please use the provided hand sanitizer when you enter the building. Pat, we are asking everyone in the building to wear a mask because they help us prevent the spread of germs. Do you have a mask of your own, if not, we are happy to provide one for you. The last thing I want to go over with you is the no visitor guidelines. This means no one can attend the appointment with you unless you need physical assistance. I understand this may be different from your past appointments and I know this may be difficult but please know if someone  is driving you we are happy to call them for you once your appointment is over.  [INSERT SITE SPECIFIC CHECK IN PROCEDURES]  Pat I've given you a lot of information, what questions do you have about what I've talked about today or your appointment tomorrow? 

## 2019-01-01 ENCOUNTER — Ambulatory Visit (INDEPENDENT_AMBULATORY_CARE_PROVIDER_SITE_OTHER)
Admission: RE | Admit: 2019-01-01 | Discharge: 2019-01-01 | Disposition: A | Discharge status: Home / Self Care | Payer: Self-pay | Source: Ambulatory Visit | Attending: Internal Medicine | Admitting: Internal Medicine

## 2019-01-01 ENCOUNTER — Other Ambulatory Visit: Payer: Self-pay

## 2019-01-01 DIAGNOSIS — E785 Hyperlipidemia, unspecified: Secondary | ICD-10-CM

## 2019-01-04 ENCOUNTER — Other Ambulatory Visit: Payer: Self-pay | Admitting: Internal Medicine

## 2019-01-04 MED ORDER — PRAVASTATIN SODIUM 20 MG PO TABS: 20.0000 mg | ORAL_TABLET | Freq: Every day | ORAL | 3 refills | Status: DC

## 2019-01-09 DIAGNOSIS — M9905 Segmental and somatic dysfunction of pelvic region: Secondary | ICD-10-CM | POA: Diagnosis not present

## 2019-01-09 DIAGNOSIS — M9903 Segmental and somatic dysfunction of lumbar region: Secondary | ICD-10-CM | POA: Diagnosis not present

## 2019-01-09 DIAGNOSIS — M5431 Sciatica, right side: Secondary | ICD-10-CM | POA: Diagnosis not present

## 2019-01-09 DIAGNOSIS — M9904 Segmental and somatic dysfunction of sacral region: Secondary | ICD-10-CM | POA: Diagnosis not present

## 2019-01-14 DIAGNOSIS — L821 Other seborrheic keratosis: Secondary | ICD-10-CM | POA: Diagnosis not present

## 2019-01-14 DIAGNOSIS — L820 Inflamed seborrheic keratosis: Secondary | ICD-10-CM | POA: Diagnosis not present

## 2019-01-14 DIAGNOSIS — L565 Disseminated superficial actinic porokeratosis (DSAP): Secondary | ICD-10-CM | POA: Diagnosis not present

## 2019-01-16 DIAGNOSIS — M5431 Sciatica, right side: Secondary | ICD-10-CM | POA: Diagnosis not present

## 2019-01-16 DIAGNOSIS — M9903 Segmental and somatic dysfunction of lumbar region: Secondary | ICD-10-CM | POA: Diagnosis not present

## 2019-01-16 DIAGNOSIS — M9905 Segmental and somatic dysfunction of pelvic region: Secondary | ICD-10-CM | POA: Diagnosis not present

## 2019-01-16 DIAGNOSIS — M9904 Segmental and somatic dysfunction of sacral region: Secondary | ICD-10-CM | POA: Diagnosis not present

## 2019-01-18 ENCOUNTER — Other Ambulatory Visit: Payer: Self-pay | Admitting: Internal Medicine

## 2019-01-23 DIAGNOSIS — M9903 Segmental and somatic dysfunction of lumbar region: Secondary | ICD-10-CM | POA: Diagnosis not present

## 2019-01-23 DIAGNOSIS — M5431 Sciatica, right side: Secondary | ICD-10-CM | POA: Diagnosis not present

## 2019-01-23 DIAGNOSIS — M9905 Segmental and somatic dysfunction of pelvic region: Secondary | ICD-10-CM | POA: Diagnosis not present

## 2019-01-23 DIAGNOSIS — M9904 Segmental and somatic dysfunction of sacral region: Secondary | ICD-10-CM | POA: Diagnosis not present

## 2019-02-06 DIAGNOSIS — M9905 Segmental and somatic dysfunction of pelvic region: Secondary | ICD-10-CM | POA: Diagnosis not present

## 2019-02-06 DIAGNOSIS — M5431 Sciatica, right side: Secondary | ICD-10-CM | POA: Diagnosis not present

## 2019-02-06 DIAGNOSIS — M9904 Segmental and somatic dysfunction of sacral region: Secondary | ICD-10-CM | POA: Diagnosis not present

## 2019-02-06 DIAGNOSIS — M9903 Segmental and somatic dysfunction of lumbar region: Secondary | ICD-10-CM | POA: Diagnosis not present

## 2019-02-13 DIAGNOSIS — L821 Other seborrheic keratosis: Secondary | ICD-10-CM | POA: Diagnosis not present

## 2019-02-13 DIAGNOSIS — Z85820 Personal history of malignant melanoma of skin: Secondary | ICD-10-CM | POA: Diagnosis not present

## 2019-02-19 ENCOUNTER — Telehealth: Payer: Self-pay | Admitting: Internal Medicine

## 2019-02-19 MED ORDER — OMEPRAZOLE-SODIUM BICARBONATE 40-1100 MG PO CAPS: ORAL_CAPSULE | ORAL | 0 refills | Status: DC

## 2019-02-19 NOTE — Telephone Encounter (Signed)
Zegerid refilled as requested.

## 2019-02-20 DIAGNOSIS — M9905 Segmental and somatic dysfunction of pelvic region: Secondary | ICD-10-CM | POA: Diagnosis not present

## 2019-02-20 DIAGNOSIS — M9903 Segmental and somatic dysfunction of lumbar region: Secondary | ICD-10-CM | POA: Diagnosis not present

## 2019-02-20 DIAGNOSIS — M5431 Sciatica, right side: Secondary | ICD-10-CM | POA: Diagnosis not present

## 2019-02-20 DIAGNOSIS — M9904 Segmental and somatic dysfunction of sacral region: Secondary | ICD-10-CM | POA: Diagnosis not present

## 2019-02-22 ENCOUNTER — Telehealth: Payer: Self-pay | Admitting: Pulmonary Disease

## 2019-02-22 ENCOUNTER — Encounter: Payer: Self-pay | Admitting: General Surgery

## 2019-02-22 NOTE — Telephone Encounter (Signed)
Called back [employer] before going to lunch earlier this afternoon and had to leave a message for Elizabeth Gray. Returning her call.

## 2019-02-25 ENCOUNTER — Other Ambulatory Visit: Payer: Self-pay

## 2019-02-25 ENCOUNTER — Encounter: Payer: Self-pay | Admitting: Nurse Practitioner

## 2019-02-25 ENCOUNTER — Ambulatory Visit (INDEPENDENT_AMBULATORY_CARE_PROVIDER_SITE_OTHER): Payer: Medicare Other | Admitting: Nurse Practitioner

## 2019-02-25 DIAGNOSIS — G4733 Obstructive sleep apnea (adult) (pediatric): Secondary | ICD-10-CM | POA: Diagnosis not present

## 2019-02-25 DIAGNOSIS — Z9989 Dependence on other enabling machines and devices: Secondary | ICD-10-CM | POA: Diagnosis not present

## 2019-02-25 MED ORDER — FLOVENT HFA 110 MCG/ACT IN AERO: 2.0000 | INHALATION_SPRAY | Freq: Two times a day (BID) | RESPIRATORY_TRACT | 12 refills | Status: DC

## 2019-02-25 NOTE — Progress Notes (Signed)
@Patient  ID: Elizabeth Gray, female    DOB: 20-Aug-1940, 78 y.o.   MRN: [id number]  Chief Complaint  Patient presents with  . Follow-up    OSA on CPAP, Sarcoidosis - back on Flovent, needs refill issue.    Referring provider: Plotnikov, Evie Lacks, MD  HPI 78 year old female with sarcoid and OSA who is followed by Dr. Elsworth Soho. CPAP set pressure 12 cmH20, DME [facility]   Tests: PSG 06/2008 showed predominantly REM related obstructive sleep apnea , correctd by CPAP 12 cm.  CT 2008 Subtle upper lobe predominant perilymphatic nodularity with mildly prominent mediastinal and bihilar lymphoid tissue as well as gastrohepatic ligament lymph node. Question sarcoid. Nodular splenic enhancement pattern  05/2012 RAST neg   OV 02/25/19 - Follow up Patient presents today for a follow-up visit.  She was last seen by Dr. Elsworth Soho on 02/19/2018.  At that visit she was advised that she could stop Flovent.  Patient states that she tried to stop Flovent, but she did develop shortness of breath and cough after stopping.  She restarted using the Flovent and states that she has been doing well for the past few months.  She does need a refill today.  Patient states that she is compliant with her CPAP.  She does read at night and sometimes falls asleep before she puts the CPAP machine on.  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  She does exercise -she has a walking routine with her friend. Denies f/c/s, n/v/d, hemoptysis, PND, leg swelling.   CPAP compliance report 11/27/18 - 02/24/19: CPAP set pressure 12 cmH2O, usage days 59/90 (66%), average usage 4 hours 12 minutes, AHI: 2.0     Allergies  Allergen Reactions  . Dexilant [Dexlansoprazole]     "set my stomach on fire and made me SOB"  . Metoclopramide Hcl Other (See Comments)    REGLAN="burning in mouth"  . Oxymetazoline Hcl Itching    AFRIN SPRAY  . Restasis [Cyclosporine]     "can't use it"  . Sucralfate Other (See Comments)   CARAFATE=Mouth sores    Immunization History  Administered Date(s) Administered  . Influenza Split 08/31/2011, 05/25/2012  . Influenza Whole 08/26/2009  . Influenza, High Dose Seasonal PF 07/30/2018  . Influenza,inj,Quad PF,6+ Mos 05/05/2015, 04/27/2016  . Influenza-Unspecified 04/12/2013, 03/31/2014  . Pneumococcal Conjugate-13 07/19/2013  . Pneumococcal Polysaccharide-23 03/06/2009, 09/06/2017  . Td 03/05/2010  . Zoster 03/17/2006  . Zoster Recombinat (Shingrix) 09/14/2018, 01/18/2019    Past Medical History:  Diagnosis Date  . Allergy   . Bursitis   . Cancer ([place]) 03/22/2017   Melanoma in situ, lentigo maligna type  . Deviated septum   . External hemorrhoids without mention of complication 7989   Colonoscopy   . Fatigue   . GERD (gastroesophageal reflux disease)   . Headache(784.0)    migraines  . Heat rash    under the breasts.Marland Kitchenappeared on monday.Marland KitchenMarland KitchenBurning & itching, uses cortisone  . Hypothyroidism   . Iron deficiency anemia, unspecified   . Lower esophageal ring 2008   EGD  . Nonorganic sleep disorder, unspecified   . Osteoarthritis   . Osteoporosis   . Pancreatitis   . Personal history of colonic polyps   . Polio 1948  . Pulmonary sarcoidosis ([place])   . Sleep apnea    cpap since 09 [facility] [place]  . Vitamin B12 deficiency   . Vitamin D deficiency     Tobacco History: Social History   Tobacco  Use  Smoking Status Former Smoker  . Packs/day: 1.00  . Years: 17.00  . Pack years: 17.00  . Types: Cigarettes  . Quit date: 08/01/1990  . Years since quitting: 28.5  Smokeless Tobacco Never Used   Counseling given: Yes   Outpatient Encounter Medications as of 02/25/2019  Medication Sig  . Alum Hydroxide-Mag Carbonate (GAVISCON PO) Take 1 tablet by mouth as needed (reflux). As directed as needed  . aspirin 81 MG tablet Take 81 mg by mouth every other day.   . Cholecalciferol (VITAMIN D PO) Take 1,000 Units by mouth every other day.   .  conjugated estrogens (PREMARIN) vaginal cream Use 1 applicator full qhs x 1 week then one q 3-7 days PV  . cyanocobalamin (,VITAMIN B-12,) 1000 MCG/ML injection Inject 1 mL (1,000 mcg total) into the skin every 14 (fourteen) days.  . EUTHYROX 100 MCG tablet Take 1 tablet by mouth once daily  . Flaxseed, Linseed, (FLAXSEED OIL) OIL Take 1 capsule by mouth daily.   Marland Kitchen GLUCOSAMINE-CHONDROITIN-MSM-D3 PO Take 1 tablet by mouth 2 (two) times daily.  . Multiple Vitamins-Minerals (ICAPS AREDS FORMULA PO) Take 1 capsule by mouth daily.    Marland Kitchen omeprazole-sodium bicarbonate (ZEGERID) 40-1100 MG capsule TAKE 1 CAPSULE BY MOUTH ONCE DAILY BEFORE BREAKFAST  . Pancrelipase, Lip-Prot-Amyl, (CREON) 24000-76000 units CPEP TAKE 1 CAPSULE (24,000 UNITS TOTAL) BY MOUTH 3 (THREE) TIMES DAILY.  Vladimir Faster Glycol-Propyl Glycol (SYSTANE ULTRA) 0.4-0.3 % SOLN Apply 1 drop to eye as needed (dry eyes).   . pravastatin (PRAVACHOL) 20 MG tablet Take 1 tablet (20 mg total) by mouth daily.  Marland Kitchen topiramate (TOPAMAX) 100 MG tablet Take 1 tablet (100 mg total) by mouth at bedtime.  . traMADol (ULTRAM) 50 MG tablet Take 100 mg by mouth at bedtime.   . vitamin C (ASCORBIC ACID) 500 MG tablet Take 500 mg by mouth daily.    . fluticasone (FLOVENT HFA) 110 MCG/ACT inhaler Inhale 2 puffs into the lungs 2 (two) times a day.  . zolpidem (AMBIEN) 10 MG tablet Take 1 tablet (10 mg total) by mouth at bedtime as needed for up to 30 days for sleep.  . [DISCONTINUED] magnesium oxide (MAG-OX) 400 MG tablet Take 400 mg by mouth at bedtime.   Facility-Administered Encounter Medications as of 02/25/2019  Medication  . 0.9 %  sodium chloride infusion     Review of Systems  Review of Systems  Constitutional: Negative.  Negative for chills and fever.  HENT: Negative.   Respiratory: Negative for cough, shortness of breath and wheezing.   Cardiovascular: Negative.  Negative for chest pain, palpitations and leg swelling.  Gastrointestinal: Negative.    Allergic/Immunologic: Negative.   Neurological: Negative.   Psychiatric/Behavioral: Negative.        Physical Exam  BP 122/68 (BP Location: Left Arm, Patient Position: Sitting, Cuff Size: Normal)   Pulse 68   Temp 97.8 F (36.6 C)   Ht 5' 3.5" (1.613 m)   Wt 149 lb (67.6 kg)   SpO2 96%   BMI 25.98 kg/m   Wt Readings from Last 5 Encounters:  02/25/19 149 lb (67.6 kg)  09/12/18 146 lb (66.2 kg)  09/12/18 146 lb (66.2 kg)  05/01/18 154 lb (69.9 kg)  04/19/18 154 lb 3.2 oz (69.9 kg)     Physical Exam Vitals signs and nursing note reviewed.  Constitutional:      General: She is not in acute distress.    Appearance: She is well-developed.  Cardiovascular:  Rate and Rhythm: Normal rate and regular rhythm.  Pulmonary:     Effort: Pulmonary effort is normal. No respiratory distress.     Breath sounds: Normal breath sounds. No wheezing or rhonchi.  Musculoskeletal:        General: No swelling.  Neurological:     Mental Status: She is alert and oriented to person, place, and time.       Assessment & Plan:   OSA on CPAP OSA and Sarcoid: This is been a stable interval for patient.  She did try to quit using her Flovent but developed shortness of breath and cough after stopping.  She has restarted and has been doing well over the past few months.  She is compliant with CPAP and states that she does benefit from use.  Patient Instructions  May continue Flovent - refill sent to pharmacy Patient continues to benefit from CPAP with good compliance and control documented Continue CPAP at current settings Continue current medications Goal of 4 hours or more usage per night Maintain healthy weight Do not drive if drowsy   Follow up with Dr. Elsworth Soho in 1 year or sooner if needed        Fenton Foy, NP 02/25/2019

## 2019-02-25 NOTE — Patient Instructions (Signed)
May continue Flovent - refill sent to pharmacy Patient continues to benefit from CPAP with good compliance and control documented Continue CPAP at current settings Continue current medications Goal of 4 hours or more usage per night Maintain healthy weight Do not drive if drowsy   Follow up with Dr. Elsworth Soho in 1 year or sooner if needed

## 2019-02-25 NOTE — Assessment & Plan Note (Signed)
OSA and Sarcoid: This is been a stable interval for patient.  She did try to quit using her Flovent but developed shortness of breath and cough after stopping.  She has restarted and has been doing well over the past few months.  She is compliant with CPAP and states that she does benefit from use.  Patient Instructions  May continue Flovent - refill sent to pharmacy Patient continues to benefit from CPAP with good compliance and control documented Continue CPAP at current settings Continue current medications Goal of 4 hours or more usage per night Maintain healthy weight Do not drive if drowsy   Follow up with Dr. Elsworth Soho in 1 year or sooner if needed

## 2019-02-25 NOTE — Telephone Encounter (Signed)
Nothing further needed. Call was handled in the 'documentation' encounter. Pt was seen today by Lazaro Arms, NP.

## 2019-03-07 DIAGNOSIS — M9905 Segmental and somatic dysfunction of pelvic region: Secondary | ICD-10-CM | POA: Diagnosis not present

## 2019-03-07 DIAGNOSIS — M9903 Segmental and somatic dysfunction of lumbar region: Secondary | ICD-10-CM | POA: Diagnosis not present

## 2019-03-07 DIAGNOSIS — M5431 Sciatica, right side: Secondary | ICD-10-CM | POA: Diagnosis not present

## 2019-03-07 DIAGNOSIS — M9904 Segmental and somatic dysfunction of sacral region: Secondary | ICD-10-CM | POA: Diagnosis not present

## 2019-03-18 ENCOUNTER — Ambulatory Visit (INDEPENDENT_AMBULATORY_CARE_PROVIDER_SITE_OTHER): Payer: Medicare Other | Admitting: Internal Medicine

## 2019-03-18 ENCOUNTER — Other Ambulatory Visit: Payer: Self-pay

## 2019-03-18 ENCOUNTER — Other Ambulatory Visit (INDEPENDENT_AMBULATORY_CARE_PROVIDER_SITE_OTHER): Payer: Medicare Other

## 2019-03-18 ENCOUNTER — Encounter: Payer: Self-pay | Admitting: Internal Medicine

## 2019-03-18 VITALS — BP 138/70 | HR 57 | Temp 97.6°F | Ht 63.5 in | Wt 147.0 lb

## 2019-03-18 DIAGNOSIS — E538 Deficiency of other specified B group vitamins: Secondary | ICD-10-CM

## 2019-03-18 DIAGNOSIS — I2583 Coronary atherosclerosis due to lipid rich plaque: Secondary | ICD-10-CM

## 2019-03-18 DIAGNOSIS — E034 Atrophy of thyroid (acquired): Secondary | ICD-10-CM | POA: Diagnosis not present

## 2019-03-18 DIAGNOSIS — E785 Hyperlipidemia, unspecified: Secondary | ICD-10-CM | POA: Diagnosis not present

## 2019-03-18 DIAGNOSIS — I2510 Atherosclerotic heart disease of native coronary artery without angina pectoris: Secondary | ICD-10-CM | POA: Diagnosis not present

## 2019-03-18 LAB — TSH: TSH: 3.87 u[IU]/mL (ref 0.35–4.50)

## 2019-03-18 LAB — BASIC METABOLIC PANEL
BUN: 24 mg/dL — ABNORMAL HIGH (ref 6–23)
CO2: 28 mEq/L (ref 19–32)
Calcium: 9.8 mg/dL (ref 8.4–10.5)
Chloride: 104 mEq/L (ref 96–112)
Creatinine, Ser: 0.93 mg/dL (ref 0.40–1.20)
GFR: 57.96 mL/min — ABNORMAL LOW (ref >60.00)
Glucose, Bld: 90 mg/dL (ref 70–99)
Potassium: 3.9 mEq/L (ref 3.5–5.1)
Sodium: 143 mEq/L (ref 135–145)

## 2019-03-18 LAB — T4, FREE: Free T4: 0.83 ng/dL (ref 0.60–1.60)

## 2019-03-18 MED ORDER — PREMARIN 0.625 MG/GM VA CREA: TOPICAL_CREAM | VAGINAL | 3 refills | Status: DC

## 2019-03-18 NOTE — Assessment & Plan Note (Signed)
2020 coronary calcium score is 289 Pravastatin, ASA

## 2019-03-18 NOTE — Assessment & Plan Note (Signed)
On B12 

## 2019-03-18 NOTE — Assessment & Plan Note (Signed)
FT4, TSH On Rx

## 2019-03-18 NOTE — Progress Notes (Signed)
Subjective:  Patient ID: Elizabeth Gray, female    DOB: [birth year]  Age: 78 y.o. MRN: [id number]  CC: No chief complaint on file.   HPI YARIELIZ WASSER presents for B12 def, OA, dyslipidemia  Outpatient Medications Prior to Visit  Medication Sig Dispense Refill  . Alum Hydroxide-Mag Carbonate (GAVISCON PO) Take 1 tablet by mouth as needed (reflux). As directed as needed    . aspirin 81 MG tablet Take 81 mg by mouth every other day.     . Cholecalciferol (VITAMIN D PO) Take 1,000 Units by mouth every other day.     . conjugated estrogens (PREMARIN) vaginal cream Use 1 applicator full qhs x 1 week then one q 3-7 days PV 42.5 g 11  . cyanocobalamin (,VITAMIN B-12,) 1000 MCG/ML injection Inject 1 mL (1,000 mcg total) into the skin every 14 (fourteen) days. 10 mL 3  . EUTHYROX 100 MCG tablet Take 1 tablet by mouth once daily 90 tablet 0  . Flaxseed, Linseed, (FLAXSEED OIL) OIL Take 1 capsule by mouth daily.     . fluticasone (FLOVENT HFA) 110 MCG/ACT inhaler Inhale 2 puffs into the lungs 2 (two) times a day. 1 Inhaler 12  . GLUCOSAMINE-CHONDROITIN-MSM-D3 PO Take 1 tablet by mouth 2 (two) times daily.    . Multiple Vitamins-Minerals (ICAPS AREDS FORMULA PO) Take 1 capsule by mouth daily.      Marland Kitchen omeprazole-sodium bicarbonate (ZEGERID) 40-1100 MG capsule TAKE 1 CAPSULE BY MOUTH ONCE DAILY BEFORE BREAKFAST 90 capsule 0  . Pancrelipase, Lip-Prot-Amyl, (CREON) 24000-76000 units CPEP TAKE 1 CAPSULE (24,000 UNITS TOTAL) BY MOUTH 3 (THREE) TIMES DAILY. 270 capsule 11  . Polyethyl Glycol-Propyl Glycol (SYSTANE ULTRA) 0.4-0.3 % SOLN Apply 1 drop to eye as needed (dry eyes).     . pravastatin (PRAVACHOL) 20 MG tablet Take 1 tablet (20 mg total) by mouth daily. 90 tablet 3  . topiramate (TOPAMAX) 100 MG tablet Take 1 tablet (100 mg total) by mouth at bedtime. 90 tablet 4  . traMADol (ULTRAM) 50 MG tablet Take 100 mg by mouth at bedtime.     . vitamin C (ASCORBIC ACID) 500 MG tablet Take 500 mg by  mouth daily.      Marland Kitchen zolpidem (AMBIEN) 10 MG tablet Take 1 tablet (10 mg total) by mouth at bedtime as needed for up to 30 days for sleep. 90 tablet 1   Facility-Administered Medications Prior to Visit  Medication Dose Route Frequency Provider Last Rate Last Dose  . 0.9 %  sodium chloride infusion  500 mL Intravenous Once Gatha Mayer, MD        ROS: Review of Systems  Constitutional: Negative for activity change, appetite change, chills, fatigue and unexpected weight change.  HENT: Negative for congestion, mouth sores and sinus pressure.   Eyes: Negative for visual disturbance.  Respiratory: Negative for cough and chest tightness.   Gastrointestinal: Negative for abdominal pain and nausea.  Genitourinary: Negative for difficulty urinating, frequency and vaginal pain.  Musculoskeletal: Positive for arthralgias and back pain. Negative for gait problem.  Skin: Negative for pallor and rash.  Neurological: Negative for dizziness, tremors, weakness, numbness and headaches.  Psychiatric/Behavioral: Negative for confusion and sleep disturbance.    Objective:  BP 138/70 (BP Location: Left Arm, Patient Position: Sitting, Cuff Size: Normal)   Pulse (!) 57   Temp 97.6 F (36.4 C) (Oral)   Ht 5' 3.5" (1.613 m)   Wt 147 lb (66.7 kg)   SpO2 97%  BMI 25.63 kg/m   BP Readings from Last 3 Encounters:  03/18/19 138/70  02/25/19 122/68  09/12/18 112/68    Wt Readings from Last 3 Encounters:  03/18/19 147 lb (66.7 kg)  02/25/19 149 lb (67.6 kg)  09/12/18 146 lb (66.2 kg)    Physical Exam Constitutional:      General: She is not in acute distress.    Appearance: She is well-developed.  HENT:     Head: Normocephalic.     Right Ear: External ear normal.     Left Ear: External ear normal.     Nose: Nose normal.  Eyes:     General:        Right eye: No discharge.        Left eye: No discharge.     Conjunctiva/sclera: Conjunctivae normal.     Pupils: Pupils are equal, round, and  reactive to light.  Neck:     Musculoskeletal: Normal range of motion and neck supple.     Thyroid: No thyromegaly.     Vascular: No JVD.     Trachea: No tracheal deviation.  Cardiovascular:     Rate and Rhythm: Normal rate and regular rhythm.     Heart sounds: Normal heart sounds.  Pulmonary:     Effort: No respiratory distress.     Breath sounds: No stridor. No wheezing.  Abdominal:     General: Bowel sounds are normal. There is no distension.     Palpations: Abdomen is soft. There is no mass.     Tenderness: There is no abdominal tenderness. There is no guarding or rebound.  Musculoskeletal:        General: No tenderness.  Lymphadenopathy:     Cervical: No cervical adenopathy.  Skin:    Findings: No erythema or rash.  Neurological:     Mental Status: She is oriented to person, place, and time.     Cranial Nerves: No cranial nerve deficit.     Motor: No abnormal muscle tone.     Coordination: Coordination normal.     Deep Tendon Reflexes: Reflexes normal.  Psychiatric:        Behavior: Behavior normal.        Thought Content: Thought content normal.        Judgment: Judgment normal.     Lab Results  Component Value Date   WBC 7.4 09/12/2018   HGB 13.3 09/12/2018   HCT 40.1 09/12/2018   PLT 324.0 09/12/2018   GLUCOSE 86 09/12/2018   CHOL 150 09/12/2018   TRIG 106.0 09/12/2018   HDL 55.30 09/12/2018   LDLCALC 74 09/12/2018   ALT 29 09/12/2018   AST 27 09/12/2018   NA 141 09/12/2018   K 4.2 09/12/2018   CL 105 09/12/2018   CREATININE 0.80 09/12/2018   BUN 19 09/12/2018   CO2 25 09/12/2018   TSH 1.41 09/12/2018   INR 3.03 (H) 05/12/2014    Ct Cardiac Scoring  Addendum Date: 01/01/2019   ADDENDUM REPORT: 01/01/2019 17:04 CLINICAL DATA:  Risk stratification EXAM: Coronary Calcium Score TECHNIQUE: The patient was scanned on a Siemens Somatom 64 slice scanner. Axial non-contrast 3 mm slices were carried out through the heart. The data set was analyzed on a  dedicated work station and scored using the [place]. FINDINGS: Non-cardiac: See separate report from [facility]. Ascending aorta: Normal diameter 3.2 cm Pericardium: Normal Coronary arteries: Calcium noted in proximal RCA/Circumflex and proximal and mid LAD IMPRESSION: Coronary calcium score of 289. This  was 43 th percentile for age and sex matched control. Jenkins Rouge Electronically Signed   By: Jenkins Rouge M.D.   On: 01/01/2019 17:04   Result Date: 01/01/2019 EXAM: OVER-READ INTERPRETATION  CT CHEST The following report is an over-read performed by radiologist Dr. Rolm Baptise of [facility], [place] on 01/01/2019. This over-read does not include interpretation of cardiac or coronary anatomy or pathology. The coronary calcium score interpretation by the cardiologist is attached. COMPARISON:  01/03/2007 FINDINGS: Vascular: Heart is normal size. Aorta is normal caliber. Scattered aortic root calcifications. Mediastinum/Nodes: No adenopathy in the lower mediastinum or hila. Lungs/Pleura: Visualized lungs clear.  No effusions. Upper Abdomen: Imaging into the upper abdomen shows no acute findings. Musculoskeletal: Chest wall soft tissues are unremarkable. No acute bony abnormality. IMPRESSION: No acute extra cardiac abnormality. Scattered aortic root calcifications. Electronically Signed: By: Rolm Baptise M.D. On: 01/01/2019 10:55    Assessment & Plan:   There are no diagnoses linked to this encounter.   No orders of the defined types were placed in this encounter.    Follow-up: No follow-ups on file.  Walker Kehr, MD

## 2019-03-18 NOTE — Addendum Note (Signed)
Addended by: Cassandria Anger on: 03/18/2019 08:54 AM   Modules accepted: Orders

## 2019-03-18 NOTE — Patient Instructions (Signed)
If you have medicare related insurance (such as traditional Medicare, Blue Cross Medicare, United HealthCare Medicare, or similar), Please make an appointment at the scheduling desk with Jill, the Wellness Health Coach, for your Wellness visit in this office, which is a benefit with your insurance.  

## 2019-03-21 DIAGNOSIS — M9905 Segmental and somatic dysfunction of pelvic region: Secondary | ICD-10-CM | POA: Diagnosis not present

## 2019-03-21 DIAGNOSIS — M9904 Segmental and somatic dysfunction of sacral region: Secondary | ICD-10-CM | POA: Diagnosis not present

## 2019-03-21 DIAGNOSIS — M5431 Sciatica, right side: Secondary | ICD-10-CM | POA: Diagnosis not present

## 2019-03-21 DIAGNOSIS — M9903 Segmental and somatic dysfunction of lumbar region: Secondary | ICD-10-CM | POA: Diagnosis not present

## 2019-04-02 ENCOUNTER — Other Ambulatory Visit: Payer: Self-pay | Admitting: Internal Medicine

## 2019-04-03 NOTE — Telephone Encounter (Signed)
[place] Controlled Database Checked Last filled: 12/25/18 # 90 LOV w/you: 03/18/19 Next appt w/you: 09/18/19

## 2019-04-18 DIAGNOSIS — M9903 Segmental and somatic dysfunction of lumbar region: Secondary | ICD-10-CM | POA: Diagnosis not present

## 2019-04-18 DIAGNOSIS — M5431 Sciatica, right side: Secondary | ICD-10-CM | POA: Diagnosis not present

## 2019-04-18 DIAGNOSIS — M9905 Segmental and somatic dysfunction of pelvic region: Secondary | ICD-10-CM | POA: Diagnosis not present

## 2019-04-18 DIAGNOSIS — M9904 Segmental and somatic dysfunction of sacral region: Secondary | ICD-10-CM | POA: Diagnosis not present

## 2019-04-20 ENCOUNTER — Other Ambulatory Visit: Payer: Self-pay | Admitting: Internal Medicine

## 2019-05-03 ENCOUNTER — Ambulatory Visit (INDEPENDENT_AMBULATORY_CARE_PROVIDER_SITE_OTHER): Payer: Medicare Other

## 2019-05-03 ENCOUNTER — Other Ambulatory Visit: Payer: Self-pay

## 2019-05-03 DIAGNOSIS — Z23 Encounter for immunization: Secondary | ICD-10-CM

## 2019-05-09 DIAGNOSIS — M9904 Segmental and somatic dysfunction of sacral region: Secondary | ICD-10-CM | POA: Diagnosis not present

## 2019-05-09 DIAGNOSIS — M9905 Segmental and somatic dysfunction of pelvic region: Secondary | ICD-10-CM | POA: Diagnosis not present

## 2019-05-09 DIAGNOSIS — M9903 Segmental and somatic dysfunction of lumbar region: Secondary | ICD-10-CM | POA: Diagnosis not present

## 2019-05-09 DIAGNOSIS — M5431 Sciatica, right side: Secondary | ICD-10-CM | POA: Diagnosis not present

## 2019-05-21 ENCOUNTER — Other Ambulatory Visit: Payer: Self-pay | Admitting: Internal Medicine

## 2019-05-23 DIAGNOSIS — M9905 Segmental and somatic dysfunction of pelvic region: Secondary | ICD-10-CM | POA: Diagnosis not present

## 2019-05-23 DIAGNOSIS — M9904 Segmental and somatic dysfunction of sacral region: Secondary | ICD-10-CM | POA: Diagnosis not present

## 2019-05-23 DIAGNOSIS — M5431 Sciatica, right side: Secondary | ICD-10-CM | POA: Diagnosis not present

## 2019-05-23 DIAGNOSIS — M9903 Segmental and somatic dysfunction of lumbar region: Secondary | ICD-10-CM | POA: Diagnosis not present

## 2019-06-06 DIAGNOSIS — M9904 Segmental and somatic dysfunction of sacral region: Secondary | ICD-10-CM | POA: Diagnosis not present

## 2019-06-06 DIAGNOSIS — M9905 Segmental and somatic dysfunction of pelvic region: Secondary | ICD-10-CM | POA: Diagnosis not present

## 2019-06-06 DIAGNOSIS — M5431 Sciatica, right side: Secondary | ICD-10-CM | POA: Diagnosis not present

## 2019-06-06 DIAGNOSIS — M9903 Segmental and somatic dysfunction of lumbar region: Secondary | ICD-10-CM | POA: Diagnosis not present

## 2019-06-20 DIAGNOSIS — M9903 Segmental and somatic dysfunction of lumbar region: Secondary | ICD-10-CM | POA: Diagnosis not present

## 2019-06-20 DIAGNOSIS — M9904 Segmental and somatic dysfunction of sacral region: Secondary | ICD-10-CM | POA: Diagnosis not present

## 2019-06-20 DIAGNOSIS — M5431 Sciatica, right side: Secondary | ICD-10-CM | POA: Diagnosis not present

## 2019-06-20 DIAGNOSIS — M9905 Segmental and somatic dysfunction of pelvic region: Secondary | ICD-10-CM | POA: Diagnosis not present

## 2019-07-04 DIAGNOSIS — M9905 Segmental and somatic dysfunction of pelvic region: Secondary | ICD-10-CM | POA: Diagnosis not present

## 2019-07-04 DIAGNOSIS — M5431 Sciatica, right side: Secondary | ICD-10-CM | POA: Diagnosis not present

## 2019-07-04 DIAGNOSIS — M9904 Segmental and somatic dysfunction of sacral region: Secondary | ICD-10-CM | POA: Diagnosis not present

## 2019-07-04 DIAGNOSIS — M9903 Segmental and somatic dysfunction of lumbar region: Secondary | ICD-10-CM | POA: Diagnosis not present

## 2019-07-12 DIAGNOSIS — Z20828 Contact with and (suspected) exposure to other viral communicable diseases: Secondary | ICD-10-CM | POA: Diagnosis not present

## 2019-07-30 ENCOUNTER — Other Ambulatory Visit: Payer: Self-pay | Admitting: Internal Medicine

## 2019-08-29 ENCOUNTER — Other Ambulatory Visit: Payer: Self-pay | Admitting: Internal Medicine

## 2019-08-29 DIAGNOSIS — M9903 Segmental and somatic dysfunction of lumbar region: Secondary | ICD-10-CM | POA: Diagnosis not present

## 2019-08-29 DIAGNOSIS — M5431 Sciatica, right side: Secondary | ICD-10-CM | POA: Diagnosis not present

## 2019-08-29 DIAGNOSIS — M9904 Segmental and somatic dysfunction of sacral region: Secondary | ICD-10-CM | POA: Diagnosis not present

## 2019-08-29 DIAGNOSIS — M9905 Segmental and somatic dysfunction of pelvic region: Secondary | ICD-10-CM | POA: Diagnosis not present

## 2019-09-12 DIAGNOSIS — M9903 Segmental and somatic dysfunction of lumbar region: Secondary | ICD-10-CM | POA: Diagnosis not present

## 2019-09-12 DIAGNOSIS — M9905 Segmental and somatic dysfunction of pelvic region: Secondary | ICD-10-CM | POA: Diagnosis not present

## 2019-09-12 DIAGNOSIS — M5431 Sciatica, right side: Secondary | ICD-10-CM | POA: Diagnosis not present

## 2019-09-12 DIAGNOSIS — M9904 Segmental and somatic dysfunction of sacral region: Secondary | ICD-10-CM | POA: Diagnosis not present

## 2019-09-18 ENCOUNTER — Encounter: Payer: Self-pay | Admitting: Internal Medicine

## 2019-09-18 ENCOUNTER — Other Ambulatory Visit: Payer: Self-pay

## 2019-09-18 ENCOUNTER — Ambulatory Visit (INDEPENDENT_AMBULATORY_CARE_PROVIDER_SITE_OTHER): Payer: Medicare Other | Admitting: Internal Medicine

## 2019-09-18 VITALS — BP 122/68 | HR 71 | Temp 97.8°F | Ht 63.5 in | Wt 149.0 lb

## 2019-09-18 DIAGNOSIS — E785 Hyperlipidemia, unspecified: Secondary | ICD-10-CM

## 2019-09-18 DIAGNOSIS — I2510 Atherosclerotic heart disease of native coronary artery without angina pectoris: Secondary | ICD-10-CM | POA: Diagnosis not present

## 2019-09-18 DIAGNOSIS — E538 Deficiency of other specified B group vitamins: Secondary | ICD-10-CM

## 2019-09-18 DIAGNOSIS — E559 Vitamin D deficiency, unspecified: Secondary | ICD-10-CM

## 2019-09-18 DIAGNOSIS — E039 Hypothyroidism, unspecified: Secondary | ICD-10-CM | POA: Diagnosis not present

## 2019-09-18 DIAGNOSIS — I2583 Coronary atherosclerosis due to lipid rich plaque: Secondary | ICD-10-CM

## 2019-09-18 LAB — LIPID PANEL
Cholesterol: 125 mg/dL (ref 0–200)
HDL: 62 mg/dL (ref >39.00)
LDL Cholesterol: 37 mg/dL (ref 0–99)
NonHDL: 63.06
Total CHOL/HDL Ratio: 2
Triglycerides: 128 mg/dL (ref 0.0–149.0)
VLDL: 25.6 mg/dL (ref 0.0–40.0)

## 2019-09-18 LAB — CBC WITH DIFFERENTIAL/PLATELET
Basophils Absolute: 0.1 10*3/uL (ref 0.0–0.1)
Basophils Relative: 0.7 % (ref 0.0–3.0)
Eosinophils Absolute: 0.3 10*3/uL (ref 0.0–0.7)
Eosinophils Relative: 3.8 % (ref 0.0–5.0)
HCT: 38.3 % (ref 36.0–46.0)
Hemoglobin: 12.9 g/dL (ref 12.0–15.0)
Lymphocytes Relative: 32 % (ref 12.0–46.0)
Lymphs Abs: 2.9 10*3/uL (ref 0.7–4.0)
MCHC: 33.6 g/dL (ref 30.0–36.0)
MCV: 87.1 fl (ref 78.0–100.0)
Monocytes Absolute: 0.9 10*3/uL (ref 0.1–1.0)
Monocytes Relative: 9.5 % (ref 3.0–12.0)
Neutro Abs: 4.8 10*3/uL (ref 1.4–7.7)
Neutrophils Relative %: 54 % (ref 43.0–77.0)
Platelets: 308 10*3/uL (ref 150.0–400.0)
RBC: 4.39 Mil/uL (ref 3.87–5.11)
RDW: 13.2 % (ref 11.5–15.5)
WBC: 8.9 10*3/uL (ref 4.0–10.5)

## 2019-09-18 LAB — URINALYSIS
Bilirubin Urine: NEGATIVE
Hgb urine dipstick: NEGATIVE
Ketones, ur: NEGATIVE
Leukocytes,Ua: NEGATIVE
Nitrite: NEGATIVE
Specific Gravity, Urine: 1.02 (ref 1.000–1.030)
Total Protein, Urine: NEGATIVE
Urine Glucose: NEGATIVE
Urobilinogen, UA: 0.2 (ref 0.0–1.0)
pH: 7.5 (ref 5.0–8.0)

## 2019-09-18 LAB — HEPATIC FUNCTION PANEL
ALT: 18 U/L (ref 0–35)
AST: 20 U/L (ref 0–37)
Albumin: 4.2 g/dL (ref 3.5–5.2)
Alkaline Phosphatase: 69 U/L (ref 39–117)
Bilirubin, Direct: 0.1 mg/dL (ref 0.0–0.3)
Total Bilirubin: 0.3 mg/dL (ref 0.2–1.2)
Total Protein: 7.2 g/dL (ref 6.0–8.3)

## 2019-09-18 LAB — BASIC METABOLIC PANEL
BUN: 26 mg/dL — ABNORMAL HIGH (ref 6–23)
CO2: 29 mEq/L (ref 19–32)
Calcium: 9.7 mg/dL (ref 8.4–10.5)
Chloride: 106 mEq/L (ref 96–112)
Creatinine, Ser: 0.8 mg/dL (ref 0.40–1.20)
GFR: 68.87 mL/min (ref >60.00)
Glucose, Bld: 95 mg/dL (ref 70–99)
Potassium: 4.1 mEq/L (ref 3.5–5.1)
Sodium: 142 mEq/L (ref 135–145)

## 2019-09-18 LAB — TSH: TSH: 0.28 u[IU]/mL — ABNORMAL LOW (ref 0.35–4.50)

## 2019-09-18 LAB — VITAMIN D 25 HYDROXY (VIT D DEFICIENCY, FRACTURES): VITD: 53.02 ng/mL (ref 30.00–100.00)

## 2019-09-18 LAB — VITAMIN B12: Vitamin B-12: 395 pg/mL (ref 211–911)

## 2019-09-18 MED ORDER — TRAMADOL HCL 50 MG PO TABS: 100.0000 mg | ORAL_TABLET | Freq: Every day | ORAL | 1 refills | Status: DC

## 2019-09-18 MED ORDER — ZOLPIDEM TARTRATE 10 MG PO TABS: ORAL_TABLET | ORAL | 1 refills | Status: DC

## 2019-09-18 NOTE — Assessment & Plan Note (Signed)
On vit D 

## 2019-09-18 NOTE — Assessment & Plan Note (Signed)
Pravastatin, ASA 

## 2019-09-18 NOTE — Assessment & Plan Note (Signed)
On Levothroid 

## 2019-09-18 NOTE — Progress Notes (Signed)
Subjective:  Patient ID: Elizabeth Gray, female    DOB: 01-Dec-1940  Age: 79 y.o. MRN: [id number]  CC: No chief complaint on file.   HPI Elizabeth Gray presents for GERD, Vit d def, OA, dyslipidemia  Outpatient Medications Prior to Visit  Medication Sig Dispense Refill  . Alum Hydroxide-Mag Carbonate (GAVISCON PO) Take 1 tablet by mouth as needed (reflux). As directed as needed    . aspirin 81 MG tablet Take 81 mg by mouth every other day.     . Cholecalciferol (VITAMIN D PO) Take 1,000 Units by mouth every other day.     . conjugated estrogens (PREMARIN) vaginal cream Use 1 applicator full qhs x 1 week then one q 3-7 days PV 42.5 g 3  . cyanocobalamin (,VITAMIN B-12,) 1000 MCG/ML injection Inject 1 mL (1,000 mcg total) into the skin every 14 (fourteen) days. 10 mL 3  . EUTHYROX 100 MCG tablet Take 1 tablet by mouth once daily 90 tablet 1  . Flaxseed, Linseed, (FLAXSEED OIL) OIL Take 1 capsule by mouth daily.     . fluticasone (FLOVENT HFA) 110 MCG/ACT inhaler Inhale 2 puffs into the lungs 2 (two) times a day. 1 Inhaler 12  . GLUCOSAMINE-CHONDROITIN-MSM-D3 PO Take 1 tablet by mouth 2 (two) times daily.    . Multiple Vitamins-Minerals (ICAPS AREDS FORMULA PO) Take 1 capsule by mouth daily.      Marland Kitchen omeprazole-sodium bicarbonate (ZEGERID) 40-1100 MG capsule TAKE 1 CAPSULE BY MOUTH ONCE DAILY BEFORE BREAKFAST 90 capsule 0  . Pancrelipase, Lip-Prot-Amyl, (CREON) 24000-76000 units CPEP TAKE 1 CAPSULE (24,000 UNITS TOTAL) BY MOUTH 3 (THREE) TIMES DAILY. 270 capsule 11  . Polyethyl Glycol-Propyl Glycol (SYSTANE ULTRA) 0.4-0.3 % SOLN Apply 1 drop to eye as needed (dry eyes).     . pravastatin (PRAVACHOL) 20 MG tablet Take 1 tablet (20 mg total) by mouth daily. 90 tablet 3  . SYRINGE-NEEDLE, DISP, 3 ML (B-D 3CC LUER-LOK SYR 25GX1/2") 25G X 1-1/2" 3 ML MISC USE AS DIRECTED. Annual appt due in [facility] must see provider for future refills 8 each 0  . topiramate (TOPAMAX) 100 MG tablet Take 1  tablet (100 mg total) by mouth at bedtime. 90 tablet 4  . traMADol (ULTRAM) 50 MG tablet Take 100 mg by mouth at bedtime.     . vitamin C (ASCORBIC ACID) 500 MG tablet Take 500 mg by mouth daily.      Marland Kitchen zolpidem (AMBIEN) 10 MG tablet TAKE 1 TABLET BY MOUTH AT BEDTIME AS NEEDED UP TO FOR 30 DAYS FOR SLEEP 90 tablet 1   Facility-Administered Medications Prior to Visit  Medication Dose Route Frequency Provider Last Rate Last Admin  . 0.9 %  sodium chloride infusion  500 mL Intravenous Once Gatha Mayer, MD        ROS: Review of Systems  Constitutional: Negative for activity change, appetite change, chills, fatigue and unexpected weight change.  HENT: Negative for congestion, mouth sores and sinus pressure.   Eyes: Negative for visual disturbance.  Respiratory: Negative for cough and chest tightness.   Gastrointestinal: Negative for abdominal pain and nausea.  Genitourinary: Negative for difficulty urinating, frequency and vaginal pain.  Musculoskeletal: Negative for back pain and gait problem.  Skin: Negative for pallor and rash.  Neurological: Negative for dizziness, tremors, weakness, numbness and headaches.  Psychiatric/Behavioral: Negative for confusion, sleep disturbance and suicidal ideas.    Objective:  BP 122/68 (BP Location: Left Arm, Patient Position: Sitting, Cuff Size: Normal)  Pulse 71   Temp 97.8 F (36.6 C) (Oral)   Ht 5' 3.5" (1.613 m)   Wt 149 lb (67.6 kg)   SpO2 97%   BMI 25.98 kg/m   BP Readings from Last 3 Encounters:  09/18/19 122/68  03/18/19 138/70  02/25/19 122/68    Wt Readings from Last 3 Encounters:  09/18/19 149 lb (67.6 kg)  03/18/19 147 lb (66.7 kg)  02/25/19 149 lb (67.6 kg)    Physical Exam Constitutional:      General: She is not in acute distress.    Appearance: She is well-developed.  HENT:     Head: Normocephalic.     Right Ear: External ear normal.     Left Ear: External ear normal.     Nose: Nose normal.  Eyes:      General:        Right eye: No discharge.        Left eye: No discharge.     Conjunctiva/sclera: Conjunctivae normal.     Pupils: Pupils are equal, round, and reactive to light.  Neck:     Thyroid: No thyromegaly.     Vascular: No JVD.     Trachea: No tracheal deviation.  Cardiovascular:     Rate and Rhythm: Normal rate and regular rhythm.     Heart sounds: Normal heart sounds.  Pulmonary:     Effort: No respiratory distress.     Breath sounds: No stridor. No wheezing.  Abdominal:     General: Bowel sounds are normal. There is no distension.     Palpations: Abdomen is soft. There is no mass.     Tenderness: There is no abdominal tenderness. There is no guarding or rebound.  Musculoskeletal:        General: No tenderness.     Cervical back: Normal range of motion and neck supple.  Lymphadenopathy:     Cervical: No cervical adenopathy.  Skin:    Findings: No erythema or rash.  Neurological:     Cranial Nerves: No cranial nerve deficit.     Motor: No abnormal muscle tone.     Coordination: Coordination normal.     Deep Tendon Reflexes: Reflexes normal.  Psychiatric:        Behavior: Behavior normal.        Thought Content: Thought content normal.        Judgment: Judgment normal.     Lab Results  Component Value Date   WBC 7.4 09/12/2018   HGB 13.3 09/12/2018   HCT 40.1 09/12/2018   PLT 324.0 09/12/2018   GLUCOSE 90 03/18/2019   CHOL 150 09/12/2018   TRIG 106.0 09/12/2018   HDL 55.30 09/12/2018   LDLCALC 74 09/12/2018   ALT 29 09/12/2018   AST 27 09/12/2018   NA 143 03/18/2019   K 3.9 03/18/2019   CL 104 03/18/2019   CREATININE 0.93 03/18/2019   BUN 24 (H) 03/18/2019   CO2 28 03/18/2019   TSH 3.87 03/18/2019   INR 3.03 (H) 05/12/2014    CT CARDIAC SCORING  Addendum Date: 01/01/2019   ADDENDUM REPORT: 01/01/2019 17:04 CLINICAL DATA:  Risk stratification EXAM: Coronary Calcium Score TECHNIQUE: The patient was scanned on a Siemens Somatom 64 slice scanner. Axial  non-contrast 3 mm slices were carried out through the heart. The data set was analyzed on a dedicated work station and scored using the [place]. FINDINGS: Non-cardiac: See separate report from [facility]. Ascending aorta: Normal diameter 3.2 cm Pericardium: Normal Coronary  arteries: Calcium noted in proximal RCA/Circumflex and proximal and mid LAD IMPRESSION: Coronary calcium score of 289. This was 66 th percentile for age and sex matched control. Jenkins Rouge Electronically Signed   By: Jenkins Rouge M.D.   On: 01/01/2019 17:04   Result Date: 01/01/2019 EXAM: OVER-READ INTERPRETATION  CT CHEST The following report is an over-read performed by radiologist Dr. Rolm Baptise of [facility], [place] on 01/01/2019. This over-read does not include interpretation of cardiac or coronary anatomy or pathology. The coronary calcium score interpretation by the cardiologist is attached. COMPARISON:  01/03/2007 FINDINGS: Vascular: Heart is normal size. Aorta is normal caliber. Scattered aortic root calcifications. Mediastinum/Nodes: No adenopathy in the lower mediastinum or hila. Lungs/Pleura: Visualized lungs clear.  No effusions. Upper Abdomen: Imaging into the upper abdomen shows no acute findings. Musculoskeletal: Chest wall soft tissues are unremarkable. No acute bony abnormality. IMPRESSION: No acute extra cardiac abnormality. Scattered aortic root calcifications. Electronically Signed: By: Rolm Baptise M.D. On: 01/01/2019 10:55    Assessment & Plan:     Follow-up: No follow-ups on file.  Walker Kehr, MD

## 2019-09-26 DIAGNOSIS — M5431 Sciatica, right side: Secondary | ICD-10-CM | POA: Diagnosis not present

## 2019-09-26 DIAGNOSIS — M9903 Segmental and somatic dysfunction of lumbar region: Secondary | ICD-10-CM | POA: Diagnosis not present

## 2019-09-26 DIAGNOSIS — M9905 Segmental and somatic dysfunction of pelvic region: Secondary | ICD-10-CM | POA: Diagnosis not present

## 2019-09-26 DIAGNOSIS — M9904 Segmental and somatic dysfunction of sacral region: Secondary | ICD-10-CM | POA: Diagnosis not present

## 2019-10-10 DIAGNOSIS — M9905 Segmental and somatic dysfunction of pelvic region: Secondary | ICD-10-CM | POA: Diagnosis not present

## 2019-10-10 DIAGNOSIS — M9904 Segmental and somatic dysfunction of sacral region: Secondary | ICD-10-CM | POA: Diagnosis not present

## 2019-10-10 DIAGNOSIS — M5431 Sciatica, right side: Secondary | ICD-10-CM | POA: Diagnosis not present

## 2019-10-10 DIAGNOSIS — M9903 Segmental and somatic dysfunction of lumbar region: Secondary | ICD-10-CM | POA: Diagnosis not present

## 2019-10-16 ENCOUNTER — Other Ambulatory Visit: Payer: Self-pay | Admitting: Physician Assistant

## 2019-10-24 DIAGNOSIS — M9903 Segmental and somatic dysfunction of lumbar region: Secondary | ICD-10-CM | POA: Diagnosis not present

## 2019-10-24 DIAGNOSIS — M9905 Segmental and somatic dysfunction of pelvic region: Secondary | ICD-10-CM | POA: Diagnosis not present

## 2019-10-24 DIAGNOSIS — M9904 Segmental and somatic dysfunction of sacral region: Secondary | ICD-10-CM | POA: Diagnosis not present

## 2019-10-24 DIAGNOSIS — M5431 Sciatica, right side: Secondary | ICD-10-CM | POA: Diagnosis not present

## 2019-11-11 ENCOUNTER — Other Ambulatory Visit: Payer: Self-pay | Admitting: Internal Medicine

## 2019-11-14 DIAGNOSIS — M9903 Segmental and somatic dysfunction of lumbar region: Secondary | ICD-10-CM | POA: Diagnosis not present

## 2019-11-14 DIAGNOSIS — M9905 Segmental and somatic dysfunction of pelvic region: Secondary | ICD-10-CM | POA: Diagnosis not present

## 2019-11-14 DIAGNOSIS — M5431 Sciatica, right side: Secondary | ICD-10-CM | POA: Diagnosis not present

## 2019-11-14 DIAGNOSIS — M9904 Segmental and somatic dysfunction of sacral region: Secondary | ICD-10-CM | POA: Diagnosis not present

## 2019-11-15 DIAGNOSIS — Z1231 Encounter for screening mammogram for malignant neoplasm of breast: Secondary | ICD-10-CM | POA: Diagnosis not present

## 2019-11-15 LAB — HM MAMMOGRAPHY

## 2019-11-23 ENCOUNTER — Other Ambulatory Visit: Payer: Self-pay | Admitting: Internal Medicine

## 2019-11-27 DIAGNOSIS — M9904 Segmental and somatic dysfunction of sacral region: Secondary | ICD-10-CM | POA: Diagnosis not present

## 2019-11-27 DIAGNOSIS — M9905 Segmental and somatic dysfunction of pelvic region: Secondary | ICD-10-CM | POA: Diagnosis not present

## 2019-11-27 DIAGNOSIS — M5431 Sciatica, right side: Secondary | ICD-10-CM | POA: Diagnosis not present

## 2019-11-27 DIAGNOSIS — M9903 Segmental and somatic dysfunction of lumbar region: Secondary | ICD-10-CM | POA: Diagnosis not present

## 2019-11-29 ENCOUNTER — Encounter: Payer: Self-pay | Admitting: Internal Medicine

## 2019-12-02 ENCOUNTER — Telehealth: Payer: Self-pay | Admitting: Internal Medicine

## 2019-12-02 DIAGNOSIS — E039 Hypothyroidism, unspecified: Secondary | ICD-10-CM

## 2019-12-02 NOTE — Telephone Encounter (Signed)
New message:   Pt is calling and states she was supposed to have labs done back in March. Pt would like to know if Dr. Camila Li would still like her to do them. Please advise.

## 2019-12-02 NOTE — Telephone Encounter (Signed)
Pt notified to still get bloodwork done and lab appt made

## 2019-12-05 ENCOUNTER — Other Ambulatory Visit (INDEPENDENT_AMBULATORY_CARE_PROVIDER_SITE_OTHER): Payer: Medicare Other

## 2019-12-05 ENCOUNTER — Other Ambulatory Visit: Payer: Self-pay

## 2019-12-05 DIAGNOSIS — E039 Hypothyroidism, unspecified: Secondary | ICD-10-CM | POA: Diagnosis not present

## 2019-12-05 LAB — T4, FREE: Free T4: 0.88 ng/dL (ref 0.60–1.60)

## 2019-12-05 LAB — TSH: TSH: 1.76 u[IU]/mL (ref 0.35–4.50)

## 2019-12-12 DIAGNOSIS — M9903 Segmental and somatic dysfunction of lumbar region: Secondary | ICD-10-CM | POA: Diagnosis not present

## 2019-12-12 DIAGNOSIS — M5431 Sciatica, right side: Secondary | ICD-10-CM | POA: Diagnosis not present

## 2019-12-12 DIAGNOSIS — M9905 Segmental and somatic dysfunction of pelvic region: Secondary | ICD-10-CM | POA: Diagnosis not present

## 2019-12-12 DIAGNOSIS — M9904 Segmental and somatic dysfunction of sacral region: Secondary | ICD-10-CM | POA: Diagnosis not present

## 2019-12-24 DIAGNOSIS — M5030 Other cervical disc degeneration, unspecified cervical region: Secondary | ICD-10-CM | POA: Diagnosis not present

## 2019-12-24 DIAGNOSIS — H40003 Preglaucoma, unspecified, bilateral: Secondary | ICD-10-CM | POA: Diagnosis not present

## 2019-12-24 DIAGNOSIS — H25813 Combined forms of age-related cataract, bilateral: Secondary | ICD-10-CM | POA: Diagnosis not present

## 2019-12-24 DIAGNOSIS — M542 Cervicalgia: Secondary | ICD-10-CM | POA: Diagnosis not present

## 2019-12-24 DIAGNOSIS — H353131 Nonexudative age-related macular degeneration, bilateral, early dry stage: Secondary | ICD-10-CM | POA: Diagnosis not present

## 2019-12-24 DIAGNOSIS — M79641 Pain in right hand: Secondary | ICD-10-CM | POA: Diagnosis not present

## 2019-12-24 DIAGNOSIS — H04123 Dry eye syndrome of bilateral lacrimal glands: Secondary | ICD-10-CM | POA: Diagnosis not present

## 2019-12-26 DIAGNOSIS — M9905 Segmental and somatic dysfunction of pelvic region: Secondary | ICD-10-CM | POA: Diagnosis not present

## 2019-12-26 DIAGNOSIS — M9904 Segmental and somatic dysfunction of sacral region: Secondary | ICD-10-CM | POA: Diagnosis not present

## 2019-12-26 DIAGNOSIS — M9903 Segmental and somatic dysfunction of lumbar region: Secondary | ICD-10-CM | POA: Diagnosis not present

## 2019-12-26 DIAGNOSIS — M5431 Sciatica, right side: Secondary | ICD-10-CM | POA: Diagnosis not present

## 2019-12-30 ENCOUNTER — Other Ambulatory Visit: Payer: Self-pay | Admitting: Internal Medicine

## 2019-12-31 ENCOUNTER — Other Ambulatory Visit: Payer: Self-pay

## 2019-12-31 ENCOUNTER — Ambulatory Visit: Payer: Medicare Other | Attending: Physician Assistant | Admitting: Physical Therapy

## 2019-12-31 DIAGNOSIS — M542 Cervicalgia: Secondary | ICD-10-CM

## 2019-12-31 DIAGNOSIS — R293 Abnormal posture: Secondary | ICD-10-CM | POA: Diagnosis present

## 2019-12-31 NOTE — Therapy (Signed)
[facility] [place], [place], [address] Phone: [phone]   Fax:  [phone]  Physical Therapy Evaluation  Patient Details  Name: Elizabeth Gray MRN: [id number] Date of Birth: Jun 27, 1941 Referring Provider (PT): Shelle Iron PA-C.   Encounter Date: 12/31/2019  PT End of Session - 12/31/19 1309    Visit Number  1    Number of Visits  12    Date for PT Re-Evaluation  01/28/20    Authorization Type  FOTO.    PT Start Time  1258    PT Stop Time  1354    PT Time Calculation (min)  56 min    Activity Tolerance  Patient tolerated treatment well    Behavior During Therapy  WFL for tasks assessed/performed       Past Medical History:  Diagnosis Date  . Allergy   . Bursitis   . Cancer ([place]) 03/22/2017   Melanoma in situ, lentigo maligna type  . Deviated septum   . External hemorrhoids without mention of complication [id number]   Colonoscopy   . Fatigue   . GERD (gastroesophageal reflux disease)   . Headache(784.0)    migraines  . Heat rash    under the breasts.Marland Kitchenappeared on monday.Marland KitchenMarland KitchenBurning & itching, uses cortisone  . Hypothyroidism   . Iron deficiency anemia, unspecified   . Lower esophageal ring 2008   EGD  . Nonorganic sleep disorder, unspecified   . Osteoarthritis   . Osteoporosis   . Pancreatitis   . Personal history of colonic polyps   . Polio 1948  . Pulmonary sarcoidosis ([place])   . Sleep apnea    cpap [facility] 09 [facility]  . Vitamin B12 deficiency   . Vitamin D deficiency     Past Surgical History:  Procedure Laterality Date  . APPENDECTOMY  1988  . CARPAL TUNNEL RELEASE  2002   rt  . CHOLECYSTECTOMY  2000  . COLONOSCOPY  12/19/2006   Multiple diminutive polyps destroyed-removed (hyperpastic), internal hemorrhoids  . COSMETIC SURGERY    . ESOPHAGOGASTRODUODENOSCOPY  12/19/2006   lower esophageal ring dilated  . HAND SURGERY  1997   left  . KNEE ARTHROSCOPY Bilateral    multiple  2 on lft 1 on rt  . [place] SURGERY  1995  . Melanoma Removal  Right    right face  . TOE SURGERY Left    left foot next to little toe  joint rem  . TOTAL KNEE ARTHROPLASTY Right 05/09/2014   Procedure: RIGHT TOTAL KNEE ARTHROPLASTY;  Surgeon: [name], MD;  Location: [employer];  Service: Orthopedics;  Laterality: Right;  . UPPER GASTROINTESTINAL ENDOSCOPY    . WISDOM TOOTH EXTRACTION      There were no vitals filed for this visit.   Subjective Assessment - 12/31/19 1310    Subjective  COVID-19 screen performed prior to patient entering clinic.  The patient presents to the clinic today with c/o bilateral neck pain, left > right and pain in region of left scapular area.  Its has been ongoing and worsening over the last two months.  Heat decreases her pain and moving her head increases her pain.  She said her doctor recommneded she get a traction unit for home but she had great success with traction in the clinic and preferred to have it again in the clinic.    Pertinent History  Osteopenia, OA, knee surgery, DDD.    How long can you sit comfortably?  Variable.    Diagnostic tests  X-ray, Dex scan.    Patient Stated Goals  Get out of pain.    Currently in Pain?  Yes    Pain Score  4     Pain Location  Neck    Pain Orientation  Right;Left    Pain Descriptors / Indicators  Aching;Sharp    Pain Type  Acute pain    Pain Onset  More than a month ago    Pain Frequency  Constant    Aggravating Factors   See above.    Pain Relieving Factors  See above.         [facility] PT Assessment - 12/31/19 0001      Assessment   Medical Diagnosis  Degeneration of cervical disc.    Referring Provider (PT)  Shelle Iron PA-C.    Onset Date/Surgical Date  --   2 months.     Precautions   Precautions  None      Restrictions   Weight Bearing Restrictions  No      Balance Screen   Has the patient fallen in the past 6 months  No    Has the patient had a decrease in activity level because of a  fear of falling?   No    Is the patient reluctant to leave their home because of a fear of falling?   No      Home Environment   Living Environment  Private residence      Prior Function   Level of Independence  Independent      Posture/Postural Control   Posture/Postural Control  Postural limitations    Postural Limitations  Rounded Shoulders;Forward head      Deep Tendon Reflexes   DTR Assessment Site  Biceps;Brachioradialis;Triceps    Biceps DTR  2+    Brachioradialis DTR  2+    Triceps DTR  2+      ROM / Strength   AROM / PROM / Strength  AROM;Strength      AROM   Overall AROM Comments  Active right cervical rotation= 52 degrees and left= 42 degrees.      Strength   Overall Strength Comments  Normal bilateral UE strength.      Palpation   Palpation comment  Tender to palpation over bilateral cervical paraspinal musculature, increased tone in UT's and tender to palpation near left medial scapular border.      Ambulation/Gait   Gait Comments  WNL.                  Objective measurements completed on examination: See above findings.      [facility] Adult PT Treatment/Exercise - 12/31/19 0001      Modalities   Modalities  Traction      Traction   Type of Traction  Cervical    Min (lbs)  5    Max (lbs)  12    Hold Time  99    Rest Time  5    Time  15                  PT Long Term Goals - 12/31/19 1430      PT LONG TERM GOAL #1   Title  independent with a HEP.    Time  6    Period  Weeks    Status  New      PT LONG TERM GOAL #2   Title  Increase active cervical rotation to  65 degrees+ so patient can turn head more easily while driving.    Time  6    Period  Weeks    Status  New      PT LONG TERM GOAL #3   Title  Perform ADL's with pain not > 3/10.    Time  6    Period  Weeks    Status  New             Plan - 12/31/19 1425    Clinical Impression Statement  The patient presents to OPPT with c/o neck pain.  She has a  limitation of cervical rotation and a FOTO score demonstrating a 56% limitation.  Her UE strength is normal as is her UE DTR's.  She is tender over her cervical paraspinal musculature and left medial scapular border region.  The patient has had excellent success with traction in the past and looks forward to it again.  Patient will benefit from skilled physical therapy intervention to address deficits and pain.    Personal Factors and Comorbidities  Comorbidity 1;Comorbidity 2    Comorbidities  Osteopenia, OA, knee surgery, DDD.    Examination-Activity Limitations  Sit;Other    Examination-Participation Restrictions  Other    Stability/Clinical Decision Making  Evolving/Moderate complexity    Clinical Decision Making  Low    Rehab Potential  Excellent    PT Frequency  3x / week    PT Duration  4 weeks    PT Next Visit Plan  Combo e'stim/U/S, STW/M, int cervical traction, chin tucks and cervical extension.    Consulted and Agree with Plan of Care  Patient       Patient will benefit from skilled therapeutic intervention in order to improve the following deficits and impairments:  Pain, Decreased range of motion, Decreased activity tolerance, Postural dysfunction  Visit Diagnosis: Cervicalgia - Plan: PT plan of care cert/re-cert  Abnormal posture - Plan: PT plan of care cert/re-cert     Problem List Patient Active Problem List   Diagnosis Date Noted  . Coronary atherosclerosis 03/18/2019  . Post-traumatic headache, not intractable 12/04/2018  . Grief 09/12/2018  . Atrophic vaginitis 09/06/2017  . Burning sensation of mouth 09/06/2017  . Osteopenia 09/05/2016  . Chest pain 03/04/2016  . Degenerative arthritis of right knee 05/09/2014  . Headache(784.0) 07/19/2013  . Tinnitus 07/19/2013  . Dry skin dermatitis 08/31/2012  . Left hip pain 08/31/2012  . Cerumen impaction 08/31/2012  . Well adult exam 05/25/2012  . Upper abdominal pain-chronic 04/02/2011  . Thumb pain 03/11/2011  .  Donor, blood 03/11/2011  . Shoulder pain 03/11/2011  . OSTEOARTHRITIS 09/10/2010  . VERTIGO 03/05/2010  . OSA on CPAP 07/22/2008  . B12 deficiency 08/16/2007  . Vitamin D deficiency 08/16/2007  . 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 SARCOIDOSIS, PULMONARY 05/09/2007  . Hypothyroidism 05/09/2007  . ALLERGIC RHINITIS 05/09/2007  . GERD 05/09/2007  . Personal history of colon polyps 05/09/2007    Neva Ramaswamy, [place] MPT 12/31/2019, 2:33 PM  [facility] [address] [place], [place], [address] Phone: [phone]   Fax:  [phone]  Name: JIYA GROESCHEL MRN: [id number] Date of Birth: 24-Nov-1940

## 2020-01-02 ENCOUNTER — Ambulatory Visit: Payer: Medicare Other | Admitting: Physical Therapy

## 2020-01-02 ENCOUNTER — Other Ambulatory Visit: Payer: Self-pay

## 2020-01-02 DIAGNOSIS — R293 Abnormal posture: Secondary | ICD-10-CM

## 2020-01-02 DIAGNOSIS — M542 Cervicalgia: Secondary | ICD-10-CM

## 2020-01-02 NOTE — Therapy (Signed)
[facility] [place], [place], [address] Phone: [phone]   Fax:  [phone]  Physical Therapy Treatment  Patient Details  Name: Elizabeth Gray MRN: [id number] Date of Birth: June 14, 1941 Referring Provider (PT): Shelle Iron PA-C.   Encounter Date: 01/02/2020  PT End of Session - 01/02/20 1118    Visit Number  2    Number of Visits  12    Date for PT Re-Evaluation  01/28/20    Authorization Type  FOTO.    PT Start Time  1030    PT Stop Time  1123    PT Time Calculation (min)  53 min    Activity Tolerance  Patient tolerated treatment well    Behavior During Therapy  WFL for tasks assessed/performed       Past Medical History:  Diagnosis Date  . Allergy   . Bursitis   . Cancer ([place]) 03/22/2017   Melanoma in situ, lentigo maligna type  . Deviated septum   . External hemorrhoids without mention of complication [id number]   Colonoscopy   . Fatigue   . GERD (gastroesophageal reflux disease)   . Headache(784.0)    migraines  . Heat rash    under the breasts.Marland Kitchenappeared on monday.Marland KitchenMarland KitchenBurning & itching, uses cortisone  . Hypothyroidism   . Iron deficiency anemia, unspecified   . Lower esophageal ring 2008   EGD  . Nonorganic sleep disorder, unspecified   . Osteoarthritis   . Osteoporosis   . Pancreatitis   . Personal history of colonic polyps   . Polio 1948  . Pulmonary sarcoidosis ([place])   . Sleep apnea    cpap since 09 [facility]  . Vitamin B12 deficiency   . Vitamin D deficiency     Past Surgical History:  Procedure Laterality Date  . APPENDECTOMY  1988  . CARPAL TUNNEL RELEASE  2002   rt  . CHOLECYSTECTOMY  2000  . COLONOSCOPY  12/19/2006   Multiple diminutive polyps destroyed-removed (hyperpastic), internal hemorrhoids  . COSMETIC SURGERY    . ESOPHAGOGASTRODUODENOSCOPY  12/19/2006   lower esophageal ring dilated  . HAND SURGERY  1997   left  . KNEE ARTHROSCOPY Bilateral    multiple  2 on lft 1 on rt  . [place] SURGERY  1995  . Melanoma Removal  Right    right face  . TOE SURGERY Left    left foot next to little toe  joint rem  . TOTAL KNEE ARTHROPLASTY Right 05/09/2014   Procedure: RIGHT TOTAL KNEE ARTHROPLASTY;  Surgeon: [name], MD;  Location: [facility];  Service: Orthopedics;  Laterality: Right;  . UPPER GASTROINTESTINAL ENDOSCOPY    . WISDOM TOOTH EXTRACTION      There were no vitals filed for this visit.  Subjective Assessment - 01/02/20 1114    Subjective  COVID-19 screen performed prior to patient entering clinic.  Did good after last treatment.    Pertinent History  Osteopenia, OA, knee surgery, DDD.    How long can you sit comfortably?  Variable.    Diagnostic tests  X-ray, Dex scan.    Patient Stated Goals  Get out of pain.    Currently in Pain?  Yes    Pain Score  4     Pain Location  Neck    Pain Orientation  Right;Left    Pain Descriptors / Indicators  Aching;Sharp    Pain Type  Acute pain    Pain Onset  More  than a month ago                        [facility] Adult PT Treatment/Exercise - 01/02/20 0001      Modalities   Modalities  Electrical Stimulation;Moist Heat;Traction      Moist Heat Therapy   Number Minutes Moist Heat  15 Minutes    Moist Heat Location  Cervical      Electrical Stimulation   Electrical Stimulation Location  Bilateral cervical.    Electrical Stimulation Action  IFC    Electrical Stimulation Parameters  80-150 Hz on 100% scan x 15 minutes.    Electrical Stimulation Goals  Pain      Traction   Type of Traction  Cervical    Min (lbs)  5    Max (lbs)  15    Hold Time  99    Rest Time  5    Time  15      Manual Therapy   Manual Therapy  Soft tissue mobilization    Soft tissue mobilization  STW/M x 9 minutes with focus on patient's left cervical and medial scapular border region.                  PT Long Term Goals - 12/31/19 1430      PT LONG TERM GOAL #1   Title   independent with a HEP.    Time  6    Period  Weeks    Status  New      PT LONG TERM GOAL #2   Title  Increase active cervical rotation to 65 degrees+ so patient can turn head more easily while driving.    Time  6    Period  Weeks    Status  New      PT LONG TERM GOAL #3   Title  Perform ADL's with pain not > 3/10.    Time  6    Period  Weeks    Status  New            Plan - 01/02/20 1123    Clinical Impression Statement  Patirent did great with treatment today including STW/M and a 3# increase in cervical traction.  Felt better after tretament.    Personal Factors and Comorbidities  Comorbidity 1;Comorbidity 2    Comorbidities  Osteopenia, OA, knee surgery, DDD.    Examination-Activity Limitations  Sit;Other    Examination-Participation Restrictions  Other    Stability/Clinical Decision Making  Evolving/Moderate complexity    Rehab Potential  Excellent    PT Frequency  3x / week    PT Duration  4 weeks    Consulted and Agree with Plan of Care  Patient       Patient will benefit from skilled therapeutic intervention in order to improve the following deficits and impairments:  Pain, Decreased range of motion, Decreased activity tolerance, Postural dysfunction  Visit Diagnosis: Cervicalgia  Abnormal posture     Problem List Patient Active Problem List   Diagnosis Date Noted  . Coronary atherosclerosis 03/18/2019  . Post-traumatic headache, not intractable 12/04/2018  . Grief 09/12/2018  . Atrophic vaginitis 09/06/2017  . Burning sensation of mouth 09/06/2017  . Osteopenia 09/05/2016  . Chest pain 03/04/2016  . Degenerative arthritis of right knee 05/09/2014  . Headache(784.0) 07/19/2013  . Tinnitus 07/19/2013  . Dry skin dermatitis 08/31/2012  . Left hip pain 08/31/2012  . Cerumen impaction 08/31/2012  . Well  adult exam 05/25/2012  . Upper abdominal pain-chronic 04/02/2011  . Thumb pain 03/11/2011  . Donor, blood 03/11/2011  . Shoulder pain 03/11/2011   . OSTEOARTHRITIS 09/10/2010  . VERTIGO 03/05/2010  . OSA on CPAP 07/22/2008  . B12 deficiency 08/16/2007  . Vitamin D deficiency 08/16/2007  . 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 SARCOIDOSIS, PULMONARY 05/09/2007  . Hypothyroidism 05/09/2007  . ALLERGIC RHINITIS 05/09/2007  . GERD 05/09/2007  . Personal history of colon polyps 05/09/2007    Zurie Platas, [place] MPT 01/02/2020, 11:24 AM  [facility] [address] [place], [place], [address] Phone: [phone]   Fax:  [phone]  Name: Elizabeth Gray MRN: [id number] Date of Birth: Apr 14, 1941

## 2020-01-06 ENCOUNTER — Other Ambulatory Visit: Payer: Self-pay

## 2020-01-06 ENCOUNTER — Ambulatory Visit: Payer: Medicare Other | Admitting: *Deleted

## 2020-01-06 DIAGNOSIS — M542 Cervicalgia: Secondary | ICD-10-CM | POA: Diagnosis not present

## 2020-01-06 DIAGNOSIS — R293 Abnormal posture: Secondary | ICD-10-CM

## 2020-01-06 NOTE — Therapy (Signed)
[facility] [place], [place], [address] Phone: [phone]   Fax:  [phone]  Physical Therapy Treatment  Patient Details  Name: Elizabeth Gray MRN: [id number] Date of Birth: Feb 17, 1941 Referring Provider (PT): Shelle Iron PA-C.   Encounter Date: 01/06/2020  PT End of Session - 01/06/20 1102    Visit Number  3    Number of Visits  12    Date for PT Re-Evaluation  01/28/20    Authorization Type  FOTO.    PT Start Time  1030    PT Stop Time  1120    PT Time Calculation (min)  50 min       Past Medical History:  Diagnosis Date  . Allergy   . Bursitis   . Cancer ([place]) 03/22/2017   Melanoma in situ, lentigo maligna type  . Deviated septum   . External hemorrhoids without mention of complication 6962   Colonoscopy   . Fatigue   . GERD (gastroesophageal reflux disease)   . Headache(784.0)    migraines  . Heat rash    under the breasts.Marland Kitchenappeared on monday.Marland KitchenMarland KitchenBurning & itching, uses cortisone  . Hypothyroidism   . Iron deficiency anemia, unspecified   . Lower esophageal ring 2008   EGD  . Nonorganic sleep disorder, unspecified   . Osteoarthritis   . Osteoporosis   . Pancreatitis   . Personal history of colonic polyps   . Polio 1948  . Pulmonary sarcoidosis ([place])   . Sleep apnea    cpap since 09 [facility]  . Vitamin B12 deficiency   . Vitamin D deficiency     Past Surgical History:  Procedure Laterality Date  . APPENDECTOMY  1988  . CARPAL TUNNEL RELEASE  2002   rt  . CHOLECYSTECTOMY  2000  . COLONOSCOPY  12/19/2006   Multiple diminutive polyps destroyed-removed (hyperpastic), internal hemorrhoids  . COSMETIC SURGERY    . ESOPHAGOGASTRODUODENOSCOPY  12/19/2006   lower esophageal ring dilated  . HAND SURGERY  1997   left  . KNEE ARTHROSCOPY Bilateral    multiple 2 on lft 1 on rt  . [place] SURGERY  1995  . Melanoma Removal  Right    right face  . TOE SURGERY Left    left  foot next to little toe  joint rem  . TOTAL KNEE ARTHROPLASTY Right 05/09/2014   Procedure: RIGHT TOTAL KNEE ARTHROPLASTY;  Surgeon: [name], MD;  Location: [facility];  Service: Orthopedics;  Laterality: Right;  . UPPER GASTROINTESTINAL ENDOSCOPY    . WISDOM TOOTH EXTRACTION      There were no vitals filed for this visit.                     [facility] Adult PT Treatment/Exercise - 01/06/20 0001      Modalities   Modalities  Electrical Stimulation;Moist Heat;Traction      Moist Heat Therapy   Number Minutes Moist Heat  10 Minutes    Moist Heat Location  Cervical      Electrical Stimulation   Electrical Stimulation Location  Bilateral cervical.    Electrical Stimulation Action  IFC    Electrical Stimulation Parameters  80-150hz  x 15 mins    Electrical Stimulation Goals  Pain      Traction   Type of Traction  Cervical    Min (lbs)  5    Max (lbs)  18    Hold Time  99  Rest Time  5    Time  15      Manual Therapy   Manual Therapy  Soft tissue mobilization    Soft tissue mobilization  STW/M x 14 minutes with focus on patient's left cervical and medial scapular border region.                  PT Long Term Goals - 12/31/19 1430      PT LONG TERM GOAL #1   Title  independent with a HEP.    Time  6    Period  Weeks    Status  New      PT LONG TERM GOAL #2   Title  Increase active cervical rotation to 65 degrees+ so patient can turn head more easily while driving.    Time  6    Period  Weeks    Status  New      PT LONG TERM GOAL #3   Title  Perform ADL's with pain not > 3/10.    Time  6    Period  Weeks    Status  New            Plan - 01/06/20 1620    Clinical Impression Statement  Pt arrived today and reports that Rxs are helping. Notable TP's still posterior cuff area LT shldr blade with some release after STW. Traction increased to 18#s today and tolerated well.    Comorbidities  Osteopenia, OA, knee surgery, DDD.     Examination-Activity Limitations  Sit;Other    Stability/Clinical Decision Making  Evolving/Moderate complexity    Rehab Potential  Excellent    PT Frequency  3x / week    PT Duration  4 weeks    PT Next Visit Plan  Combo e'stim/U/S, STW/M, int cervical traction, chin tucks and cervical extension.    Consulted and Agree with Plan of Care  Patient       Patient will benefit from skilled therapeutic intervention in order to improve the following deficits and impairments:  Pain, Decreased range of motion, Decreased activity tolerance, Postural dysfunction  Visit Diagnosis: Abnormal posture  Cervicalgia     Problem List Patient Active Problem List   Diagnosis Date Noted  . Coronary atherosclerosis 03/18/2019  . Post-traumatic headache, not intractable 12/04/2018  . Grief 09/12/2018  . Atrophic vaginitis 09/06/2017  . Burning sensation of mouth 09/06/2017  . Osteopenia 09/05/2016  . Chest pain 03/04/2016  . Degenerative arthritis of right knee 05/09/2014  . Headache(784.0) 07/19/2013  . Tinnitus 07/19/2013  . Dry skin dermatitis 08/31/2012  . Left hip pain 08/31/2012  . Cerumen impaction 08/31/2012  . Well adult exam 05/25/2012  . Upper abdominal pain-chronic 04/02/2011  . Thumb pain 03/11/2011  . Donor, blood 03/11/2011  . Shoulder pain 03/11/2011  . OSTEOARTHRITIS 09/10/2010  . VERTIGO 03/05/2010  . OSA on CPAP 07/22/2008  . B12 deficiency 08/16/2007  . Vitamin D deficiency 08/16/2007  . 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 SARCOIDOSIS, PULMONARY 05/09/2007  . Hypothyroidism 05/09/2007  . ALLERGIC RHINITIS 05/09/2007  . GERD 05/09/2007  . Personal history of colon polyps 05/09/2007    Tove Wideman,CHRIS, PTA 01/06/2020, 4:30 PM  [facility] [address] [place], [place], [address] Phone: [phone]   Fax:  [phone]  Name: Elizabeth Gray MRN: [id number] Date of Birth: Aug 24, 1940

## 2020-01-09 ENCOUNTER — Encounter: Payer: Self-pay | Admitting: Physical Therapy

## 2020-01-09 ENCOUNTER — Other Ambulatory Visit: Payer: Self-pay

## 2020-01-09 ENCOUNTER — Ambulatory Visit: Payer: Medicare Other | Admitting: Physical Therapy

## 2020-01-09 DIAGNOSIS — M542 Cervicalgia: Secondary | ICD-10-CM | POA: Diagnosis not present

## 2020-01-09 DIAGNOSIS — R293 Abnormal posture: Secondary | ICD-10-CM

## 2020-01-09 NOTE — Therapy (Signed)
[facility] [place], [place], [address] Phone: [phone]   Fax:  [phone]  Physical Therapy Treatment  Patient Details  Name: Elizabeth Gray MRN: [id number] Date of Birth: 15-Mar-1941 Referring Provider (PT): Shelle Iron PA-C.   Encounter Date: 01/09/2020   PT End of Session - 01/09/20 1213    Visit Number 4    Number of Visits 12    Date for PT Re-Evaluation 01/28/20    Authorization Type FOTO.    PT Start Time 0945    PT Stop Time 1033    PT Time Calculation (min) 48 min    Activity Tolerance Patient tolerated treatment well    Behavior During Therapy WFL for tasks assessed/performed           Past Medical History:  Diagnosis Date  . Allergy   . Bursitis   . Cancer ([place]) 03/22/2017   Melanoma in situ, lentigo maligna type  . Deviated septum   . External hemorrhoids without mention of complication 6659   Colonoscopy   . Fatigue   . GERD (gastroesophageal reflux disease)   . Headache(784.0)    migraines  . Heat rash    under the breasts.Marland Kitchenappeared on monday.Marland KitchenMarland KitchenBurning & itching, uses cortisone  . Hypothyroidism   . Iron deficiency anemia, unspecified   . Lower esophageal ring 2008   EGD  . Nonorganic sleep disorder, unspecified   . Osteoarthritis   . Osteoporosis   . Pancreatitis   . Personal history of colonic polyps   . Polio 1948  . Pulmonary sarcoidosis ([place])   . Sleep apnea    cpap since 09 [facility]  . Vitamin B12 deficiency   . Vitamin D deficiency     Past Surgical History:  Procedure Laterality Date  . APPENDECTOMY  1988  . CARPAL TUNNEL RELEASE  2002   rt  . CHOLECYSTECTOMY  2000  . COLONOSCOPY  12/19/2006   Multiple diminutive polyps destroyed-removed (hyperpastic), internal hemorrhoids  . COSMETIC SURGERY    . ESOPHAGOGASTRODUODENOSCOPY  12/19/2006   lower esophageal ring dilated  . HAND SURGERY  1997   left  . KNEE ARTHROSCOPY Bilateral    multiple 2  on lft 1 on rt  . [place] SURGERY  1995  . Melanoma Removal  Right    right face  . TOE SURGERY Left    left foot next to little toe  joint rem  . TOTAL KNEE ARTHROPLASTY Right 05/09/2014   Procedure: RIGHT TOTAL KNEE ARTHROPLASTY;  Surgeon: [name], MD;  Location: [facility];  Service: Orthopedics;  Laterality: Right;  . UPPER GASTROINTESTINAL ENDOSCOPY    . WISDOM TOOTH EXTRACTION      There were no vitals filed for this visit.   Subjective Assessment - 01/09/20 1202    Subjective COVID-19 screen performed prior to patient entering clinic.  Getting better.    Pertinent History Osteopenia, OA, knee surgery, DDD.    How long can you sit comfortably? Variable.    Diagnostic tests X-ray, Dex scan.    Patient Stated Goals Get out of pain.    Currently in Pain? Yes    Pain Score 3     Pain Location Neck    Pain Orientation Right;Left    Pain Descriptors / Indicators Aching;Sharp    Pain Onset More than a month ago  [facility] Adult PT Treatment/Exercise - 01/09/20 0001      Electrical Stimulation   Electrical Stimulation Location Bilateral cervical region.    Electrical Stimulation Action IFC    Electrical Stimulation Parameters 80-150 Hz x 15 minutes.      Traction   Type of Traction Cervical    Min (lbs) 5    Max (lbs) 20    Hold Time 99    Rest Time 5    Time 15      Manual Therapy   Manual Therapy Soft tissue mobilization    Soft tissue mobilization STW/M x 8 minutes left scapular region and left cervical musculature.                       PT Long Term Goals - 12/31/19 1430      PT LONG TERM GOAL #1   Title independent with a HEP.    Time 6    Period Weeks    Status New      PT LONG TERM GOAL #2   Title Increase active cervical rotation to 65 degrees+ so patient can turn head more easily while driving.    Time 6    Period Weeks    Status New      PT LONG TERM GOAL #3   Title Perform ADL's with  pain not > 3/10.    Time 6    Period Weeks    Status New                 Plan - 01/09/20 1212    Clinical Impression Statement patient did great with 20# of cervical traction.  She is doing better better.  Less tender to palption over left scapular musculature.    Personal Factors and Comorbidities Comorbidity 1;Comorbidity 2    Comorbidities Osteopenia, OA, knee surgery, DDD.    Examination-Activity Limitations Sit;Other    Examination-Participation Restrictions Other    Stability/Clinical Decision Making Evolving/Moderate complexity           Patient will benefit from skilled therapeutic intervention in order to improve the following deficits and impairments:  Pain, Decreased range of motion, Decreased activity tolerance, Postural dysfunction  Visit Diagnosis: Abnormal posture  Cervicalgia     Problem List Patient Active Problem List   Diagnosis Date Noted  . Coronary atherosclerosis 03/18/2019  . Post-traumatic headache, not intractable 12/04/2018  . Grief 09/12/2018  . Atrophic vaginitis 09/06/2017  . Burning sensation of mouth 09/06/2017  . Osteopenia 09/05/2016  . Chest pain 03/04/2016  . Degenerative arthritis of right knee 05/09/2014  . Headache(784.0) 07/19/2013  . Tinnitus 07/19/2013  . Dry skin dermatitis 08/31/2012  . Left hip pain 08/31/2012  . Cerumen impaction 08/31/2012  . Well adult exam 05/25/2012  . Upper abdominal pain-chronic 04/02/2011  . Thumb pain 03/11/2011  . Donor, blood 03/11/2011  . Shoulder pain 03/11/2011  . OSTEOARTHRITIS 09/10/2010  . VERTIGO 03/05/2010  . OSA on CPAP 07/22/2008  . B12 deficiency 08/16/2007  . Vitamin D deficiency 08/16/2007  . 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 SARCOIDOSIS, PULMONARY 05/09/2007  . Hypothyroidism 05/09/2007  . ALLERGIC RHINITIS 05/09/2007  . GERD 05/09/2007  . Personal history of colon polyps 05/09/2007    Elizabeth Gray, Elizabeth Gray 01/09/2020, 12:14 PM  [facility] [address] [place], [place], [address] Phone: [phone]   Fax:  [phone]  Name: Elizabeth Gray MRN: [id number] Date of Birth: 18-Oct-1940

## 2020-01-13 ENCOUNTER — Ambulatory Visit: Payer: Medicare Other | Admitting: Physical Therapy

## 2020-01-13 ENCOUNTER — Other Ambulatory Visit: Payer: Self-pay

## 2020-01-13 DIAGNOSIS — M542 Cervicalgia: Secondary | ICD-10-CM

## 2020-01-13 DIAGNOSIS — R293 Abnormal posture: Secondary | ICD-10-CM

## 2020-01-13 NOTE — Therapy (Signed)
[facility] [place], [place], [address] Phone: [phone]   Fax:  [phone]  Physical Therapy Treatment  Patient Details  Name: Elizabeth Gray MRN: [id number] Date of Birth: 1941/01/08 Referring Provider (PT): Shelle Iron PA-C.   Encounter Date: 01/13/2020   PT End of Session - 01/13/20 1035    Visit Number 5    Number of Visits 12    Date for PT Re-Evaluation 01/28/20    Authorization Type FOTO.    PT Start Time [phone]    PT Stop Time 1042    PT Time Calculation (min) 56 min    Activity Tolerance Patient tolerated treatment well    Behavior During Therapy WFL for tasks assessed/performed           Past Medical History:  Diagnosis Date  . Allergy   . Bursitis   . Cancer ([place]) 03/22/2017   Melanoma in situ, lentigo maligna type  . Deviated septum   . External hemorrhoids without mention of complication 8546   Colonoscopy   . Fatigue   . GERD (gastroesophageal reflux disease)   . Headache(784.0)    migraines  . Heat rash    under the breasts.Marland Kitchenappeared on monday.Marland KitchenMarland KitchenBurning & itching, uses cortisone  . Hypothyroidism   . Iron deficiency anemia, unspecified   . Lower esophageal ring 2008   EGD  . Nonorganic sleep disorder, unspecified   . Osteoarthritis   . Osteoporosis   . Pancreatitis   . Personal history of colonic polyps   . Polio 1948  . Pulmonary sarcoidosis ([place])   . Sleep apnea    cpap since 09 [facility]  . Vitamin B12 deficiency   . Vitamin D deficiency     Past Surgical History:  Procedure Laterality Date  . APPENDECTOMY  1988  . CARPAL TUNNEL RELEASE  2002   rt  . CHOLECYSTECTOMY  2000  . COLONOSCOPY  12/19/2006   Multiple diminutive polyps destroyed-removed (hyperpastic), internal hemorrhoids  . COSMETIC SURGERY    . ESOPHAGOGASTRODUODENOSCOPY  12/19/2006   lower esophageal ring dilated  . HAND SURGERY  1997   left  . KNEE ARTHROSCOPY Bilateral    multiple 2  on lft 1 on rt  . [place] SURGERY  1995  . Melanoma Removal  Right    right face  . TOE SURGERY Left    left foot next to little toe  joint rem  . TOTAL KNEE ARTHROPLASTY Right 05/09/2014   Procedure: RIGHT TOTAL KNEE ARTHROPLASTY;  Surgeon: [name], MD;  Location: [facility];  Service: Orthopedics;  Laterality: Right;  . UPPER GASTROINTESTINAL ENDOSCOPY    . WISDOM TOOTH EXTRACTION      There were no vitals filed for this visit.   Subjective Assessment - 01/13/20 1027    Subjective COVID-19 screen performed prior to patient entering clinic.  Doing better.  Left side better.  Right still hurting.    Pertinent History Osteopenia, OA, knee surgery, DDD.    How long can you sit comfortably? Variable.    Diagnostic tests X-ray, Dex scan.    Patient Stated Goals Get out of pain.    Currently in Pain? Yes    Pain Score 3     Pain Location Neck    Pain Orientation Right;Left    Pain Descriptors / Indicators Aching;Sharp    Pain Type Acute pain    Pain Onset More than a month ago  [facility] Adult PT Treatment/Exercise - 01/13/20 0001      Moist Heat Therapy   Number Minutes Moist Heat 15 Minutes    Moist Heat Location Cervical      Electrical Stimulation   Electrical Stimulation Location Bilateral cervical    Electrical Stimulation Action IFC    Electrical Stimulation Parameters 80-150 Hz x 15 minutes.      Traction   Type of Traction Cervical    Min (lbs) 5    Max (lbs) 21    Hold Time 99    Rest Time 5    Time 15      Manual Therapy   Manual Therapy Soft tissue mobilization    Soft tissue mobilization STW/M x 10 minutes to patient's bilateral cervical paraspinal musculature to reduce tone.                       PT Long Term Goals - 12/31/19 1430      PT LONG TERM GOAL #1   Title independent with a HEP.    Time 6    Period Weeks    Status New      PT LONG TERM GOAL #2   Title Increase active cervical  rotation to 65 degrees+ so patient can turn head more easily while driving.    Time 6    Period Weeks    Status New      PT LONG TERM GOAL #3   Title Perform ADL's with pain not > 3/10.    Time 6    Period Weeks    Status New                 Plan - 01/13/20 1033    Clinical Impression Statement Patient doing very well with less left sided pain.  She is tolerating intermittment cevical traction very well.    Personal Factors and Comorbidities Comorbidity 1;Comorbidity 2    Comorbidities Osteopenia, OA, knee surgery, DDD.    Examination-Activity Limitations Sit;Other    Examination-Participation Restrictions Other    Stability/Clinical Decision Making Evolving/Moderate complexity    Rehab Potential Excellent    PT Frequency 3x / week    PT Duration 4 weeks    PT Next Visit Plan Combo e'stim/U/S, STW/M, int cervical traction, chin tucks and cervical extension.    Consulted and Agree with Plan of Care Patient           Patient will benefit from skilled therapeutic intervention in order to improve the following deficits and impairments:  Pain, Decreased range of motion, Decreased activity tolerance, Postural dysfunction  Visit Diagnosis: Abnormal posture  Cervicalgia     Problem List Patient Active Problem List   Diagnosis Date Noted  . Coronary atherosclerosis 03/18/2019  . Post-traumatic headache, not intractable 12/04/2018  . Grief 09/12/2018  . Atrophic vaginitis 09/06/2017  . Burning sensation of mouth 09/06/2017  . Osteopenia 09/05/2016  . Chest pain 03/04/2016  . Degenerative arthritis of right knee 05/09/2014  . Headache(784.0) 07/19/2013  . Tinnitus 07/19/2013  . Dry skin dermatitis 08/31/2012  . Left hip pain 08/31/2012  . Cerumen impaction 08/31/2012  . Well adult exam 05/25/2012  . Upper abdominal pain-chronic 04/02/2011  . Thumb pain 03/11/2011  . Donor, blood 03/11/2011  . Shoulder pain 03/11/2011  . OSTEOARTHRITIS 09/10/2010  . VERTIGO  03/05/2010  . OSA on CPAP 07/22/2008  . B12 deficiency 08/16/2007  . Vitamin D deficiency 08/16/2007  . 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  SARCOIDOSIS, PULMONARY 05/09/2007  . Hypothyroidism 05/09/2007  . ALLERGIC RHINITIS 05/09/2007  . GERD 05/09/2007  . Personal history of colon polyps 05/09/2007    Elizabeth Gray, [place] MPT 01/13/2020, 10:43 AM  [facility] [address] [place], [place], [address] Phone: [phone]   Fax:  [phone]  Name: HODAN WURTZ MRN: [id number] Date of Birth: [birth year]

## 2020-01-16 ENCOUNTER — Ambulatory Visit: Payer: Medicare Other | Admitting: Physical Therapy

## 2020-01-16 ENCOUNTER — Other Ambulatory Visit: Payer: Self-pay

## 2020-01-16 DIAGNOSIS — M542 Cervicalgia: Secondary | ICD-10-CM | POA: Diagnosis not present

## 2020-01-16 DIAGNOSIS — R293 Abnormal posture: Secondary | ICD-10-CM

## 2020-01-16 NOTE — Therapy (Signed)
[facility] [place], [place], [address] Phone: [phone]   Fax:  [phone]  Physical Therapy Treatment  Patient Details  Name: Elizabeth Gray MRN: [id number] Date of Birth: 17-Jan-1941 Referring Provider (PT): Shelle Iron PA-C.   Encounter Date: 01/16/2020   PT End of Session - 01/16/20 1037    Visit Number 6    Number of Visits 12    Date for PT Re-Evaluation 01/28/20    Authorization Type FOTO.    PT Start Time [phone]    PT Stop Time 1040    PT Time Calculation (min) 54 min    Activity Tolerance Patient tolerated treatment well    Behavior During Therapy WFL for tasks assessed/performed           Past Medical History:  Diagnosis Date  . Allergy   . Bursitis   . Cancer ([place]) 03/22/2017   Melanoma in situ, lentigo maligna type  . Deviated septum   . External hemorrhoids without mention of complication 1448   Colonoscopy   . Fatigue   . GERD (gastroesophageal reflux disease)   . Headache(784.0)    migraines  . Heat rash    under the breasts.Marland Kitchenappeared on monday.Marland KitchenMarland KitchenBurning & itching, uses cortisone  . Hypothyroidism   . Iron deficiency anemia, unspecified   . Lower esophageal ring 2008   EGD  . Nonorganic sleep disorder, unspecified   . Osteoarthritis   . Osteoporosis   . Pancreatitis   . Personal history of colonic polyps   . Polio 1948  . Pulmonary sarcoidosis ([place])   . Sleep apnea    cpap [facility] 09 [facility]  . Vitamin B12 deficiency   . Vitamin D deficiency     Past Surgical History:  Procedure Laterality Date  . APPENDECTOMY  1988  . CARPAL TUNNEL RELEASE  2002   rt  . CHOLECYSTECTOMY  2000  . COLONOSCOPY  12/19/2006   Multiple diminutive polyps destroyed-removed (hyperpastic), internal hemorrhoids  . COSMETIC SURGERY    . ESOPHAGOGASTRODUODENOSCOPY  12/19/2006   lower esophageal ring dilated  . HAND SURGERY  1997   left  . KNEE ARTHROSCOPY Bilateral    multiple 2  on lft 1 on rt  . [place] SURGERY  1995  . Melanoma Removal  Right    right face  . TOE SURGERY Left    left foot next to little toe  joint rem  . TOTAL KNEE ARTHROPLASTY Right 05/09/2014   Procedure: RIGHT TOTAL KNEE ARTHROPLASTY;  Surgeon: [name], MD;  Location: [place];  Service: Orthopedics;  Laterality: Right;  . UPPER GASTROINTESTINAL ENDOSCOPY    . WISDOM TOOTH EXTRACTION      There were no vitals filed for this visit.   Subjective Assessment - 01/16/20 0950    Subjective COVID-19 screen performed prior to patient entering clinic.  Patient arrived with less pain overall, treatments helping    Pertinent History Osteopenia, OA, knee surgery, DDD.    How long can you sit comfortably? Variable.    Diagnostic tests X-ray, Dex scan.    Patient Stated Goals Get out of pain.    Currently in Pain? Yes    Pain Score 2     Pain Location Neck    Pain Orientation Right;Left    Pain Descriptors / Indicators Discomfort    Pain Type Acute pain    Pain Onset More than a month ago    Pain Frequency Intermittent  Aggravating Factors  laying on it wrong    Pain Relieving Factors at rest in good position              [facility] PT Assessment - 01/16/20 0001      AROM   AROM Assessment Site Cervical    Cervical - Right Rotation 52    Cervical - Left Rotation 52                         [facility] Adult PT Treatment/Exercise - 01/16/20 0001      Moist Heat Therapy   Number Minutes Moist Heat 10 Minutes    Moist Heat Location Cervical      Electrical Stimulation   Electrical Stimulation Location Bilateral cervical    Electrical Stimulation Action IFC    Electrical Stimulation Parameters 80-150hz  x19min    Electrical Stimulation Goals Pain      Traction   Type of Traction Cervical    Min (lbs) 5    Max (lbs) 21    Hold Time 99    Rest Time 5    Time 15      Manual Therapy   Manual Therapy Soft tissue mobilization    Manual therapy comments manual STW to  bil cervical paraspinals/UT/ and rhomboids to reduce pain and tone                  PT Education - 01/16/20 1037    Education Details HEP progression    Person(s) Educated Patient    Methods Explanation;Demonstration;Handout    Comprehension Verbalized understanding;Returned demonstration               PT Long Term Goals - 01/16/20 1033      PT LONG TERM GOAL #1   Title independent with a HEP.    Time 6    Period Weeks    Status On-going      PT LONG TERM GOAL #2   Title Increase active cervical rotation to 65 degrees+ so patient can turn head more easily while driving.    Time 6    Period Weeks    Status On-going      PT LONG TERM GOAL #3   Title Perform ADL's with pain not > 3/10.    Time 6    Period Weeks    Status On-going                 Plan - 01/16/20 1049    Clinical Impression Statement Patient tolerated treatment well today. Patient has ongoing stiffness yet feels improvement overall. Today AROM bil 52 degrees. Patient improved with left rotation yet none with right. HEP provided for home progression. Goals are progressing this week.    Personal Factors and Comorbidities Comorbidity 1;Comorbidity 2    Comorbidities Osteopenia, OA, knee surgery, DDD.    Examination-Activity Limitations Sit;Other    Examination-Participation Restrictions Other    Stability/Clinical Decision Making Evolving/Moderate complexity    Rehab Potential Excellent    PT Frequency 3x / week    PT Duration 4 weeks    PT Next Visit Plan Combo e'stim/U/S, STW/M, int cervical traction, chin tucks and cervical extension.    Consulted and Agree with Plan of Care Patient           Patient will benefit from skilled therapeutic intervention in order to improve the following deficits and impairments:  Pain, Decreased range of motion, Decreased activity tolerance, Postural dysfunction  Visit  Diagnosis: Abnormal posture  Cervicalgia     Problem List Patient Active  Problem List   Diagnosis Date Noted  . Coronary atherosclerosis 03/18/2019  . Post-traumatic headache, not intractable 12/04/2018  . Grief 09/12/2018  . Atrophic vaginitis 09/06/2017  . Burning sensation of mouth 09/06/2017  . Osteopenia 09/05/2016  . Chest pain 03/04/2016  . Degenerative arthritis of right knee 05/09/2014  . Headache(784.0) 07/19/2013  . Tinnitus 07/19/2013  . Dry skin dermatitis 08/31/2012  . Left hip pain 08/31/2012  . Cerumen impaction 08/31/2012  . Well adult exam 05/25/2012  . Upper abdominal pain-chronic 04/02/2011  . Thumb pain 03/11/2011  . Donor, blood 03/11/2011  . Shoulder pain 03/11/2011  . OSTEOARTHRITIS 09/10/2010  . VERTIGO 03/05/2010  . OSA on CPAP 07/22/2008  . B12 deficiency 08/16/2007  . Vitamin D deficiency 08/16/2007  . 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 SARCOIDOSIS, PULMONARY 05/09/2007  . Hypothyroidism 05/09/2007  . ALLERGIC RHINITIS 05/09/2007  . GERD 05/09/2007  . Personal history of colon polyps 05/09/2007    Phillips Climes, PTA 01/16/2020, 10:52 AM  [facility] [place], [place], [address] Phone: [phone]   Fax:  [phone]  Name: SIDNEE GAMBRILL MRN: [id number] Date of Birth: 01-25-1941

## 2020-01-16 NOTE — Patient Instructions (Addendum)
AROM: Neck Rotation   Turn head slowly to look over one shoulder, then the other. Hold each position _10___ seconds. Repeat _5___ times per set. Do __2__ sets per session. Do _2-3___ sessions per day.  http://orth.exer.us/294   Copyright  VHI. All rights reserved.  AROM: Lateral Neck Flexion   Slowly tilt head toward one shoulder, then the other. Hold each position _10___ seconds. Repeat __5__ times per set. Do __2__ sets per session. Do __2-3__ sessions per day.  http://orth.exer.us/296   Copyright  VHI. All rights reserved.  Stretch Break - Chin Tuck   Looking straight forward, tuck chin and hold __10__ seconds. Relax and return to starting position. Repeat __5-10__ times every _3-4___ hours.   Scapular Retraction: Elbow Flexion (Standing)    With elbows bent to 90, pinch shoulder blades together and rotate arms out, keeping elbows bent. Repeat _10___ times per set. Do __2__ sets per session. Do __1-2__ sessions per day.   ROM: Flexion (Standing)    Bring arms straight out in front and raise as high as possible without pain. Keep palms facing up. Repeat _10___ times per set. Do __2__ sets per session. Do ___1-2_ sessions per day.  http://orth.exer.us/908   Copyright  VHI. All rights reserved.

## 2020-01-20 ENCOUNTER — Ambulatory Visit: Payer: Medicare Other | Admitting: Physical Therapy

## 2020-01-20 ENCOUNTER — Other Ambulatory Visit: Payer: Self-pay

## 2020-01-20 DIAGNOSIS — M542 Cervicalgia: Secondary | ICD-10-CM | POA: Diagnosis not present

## 2020-01-20 DIAGNOSIS — R293 Abnormal posture: Secondary | ICD-10-CM

## 2020-01-20 NOTE — Therapy (Signed)
[facility] [place], [place], [address] Phone: [phone]   Fax:  [phone]  Physical Therapy Treatment  Patient Details  Name: Elizabeth Gray MRN: [id number] Date of Birth: [birth year] Referring Provider (PT): Shelle Iron PA-C.   Encounter Date: 01/20/2020   PT End of Session - 01/20/20 1033    Visit Number 7    Number of Visits 12    Date for PT Re-Evaluation 01/28/20    Authorization Type FOTO.    PT Start Time 0945    PT Stop Time 1039    PT Time Calculation (min) 54 min    Activity Tolerance Patient tolerated treatment well    Behavior During Therapy WFL for tasks assessed/performed           Past Medical History:  Diagnosis Date   Allergy    Bursitis    Cancer ([place]) 03/22/2017   Melanoma in situ, lentigo maligna type   Deviated septum    External hemorrhoids without mention of complication 2023   Colonoscopy    Fatigue    GERD (gastroesophageal reflux disease)    Headache(784.0)    migraines   Heat rash    under the breasts.Marland Kitchenappeared on monday.Marland KitchenMarland KitchenBurning & itching, uses cortisone   Hypothyroidism    Iron deficiency anemia, unspecified    Lower esophageal ring 2008   EGD   Nonorganic sleep disorder, unspecified    Osteoarthritis    Osteoporosis    Pancreatitis    Personal history of colonic polyps    Polio 1948   Pulmonary sarcoidosis ([place])    Sleep apnea    cpap [facility]   Vitamin B12 deficiency    Vitamin D deficiency     Past Surgical History:  Procedure Laterality Date   [place]  2002   rt   CHOLECYSTECTOMY  2000   COLONOSCOPY  12/19/2006   Multiple diminutive polyps destroyed-removed (hyperpastic), internal hemorrhoids   COSMETIC SURGERY     ESOPHAGOGASTRODUODENOSCOPY  12/19/2006   lower esophageal ring dilated   HAND SURGERY  1997   left   KNEE ARTHROSCOPY Bilateral    multiple 2  on lft 1 on rt   [place]   Melanoma Removal  Right    right face   TOE SURGERY Left    left foot next to little toe  joint rem   TOTAL KNEE ARTHROPLASTY Right 05/09/2014   Procedure: RIGHT TOTAL KNEE ARTHROPLASTY;  Surgeon: [name], MD;  Location: [facility];  Service: Orthopedics;  Laterality: Right;   UPPER GASTROINTESTINAL ENDOSCOPY     WISDOM TOOTH EXTRACTION      There were no vitals filed for this visit.   Subjective Assessment - 01/20/20 1034    Subjective COVID-19 screen performed prior to patient entering clinic.  Getting better.    Pertinent History Osteopenia, OA, knee surgery, DDD.    How long can you sit comfortably? Variable.    Diagnostic tests X-ray, Dex scan.    Patient Stated Goals Get out of pain.    Currently in Pain? Yes    Pain Score 2     Pain Location Neck    Pain Orientation Right;Left    Pain Descriptors / Indicators Discomfort    Pain Onset More than a month ago  [facility] Adult PT Treatment/Exercise - 01/20/20 0001      Modalities   Modalities Electrical Stimulation;Moist Heat;Traction      Moist Heat Therapy   Number Minutes Moist Heat 15 Minutes    Moist Heat Location Cervical      Electrical Stimulation   Electrical Stimulation Location Bilateral cervical.    Electrical Stimulation Action Pre-mod.    Electrical Stimulation Parameters 80-150 Hz x 15 minutes.    Electrical Stimulation Goals Pain      Traction   Type of Traction Cervical    Min (lbs) 5    Max (lbs) 21    Hold Time 99    Rest Time 5    Time 15      Manual Therapy   Manual Therapy Soft tissue mobilization    Soft tissue mobilization STW/M x 10 minutes to patient's bilateral cervical musculature to reduce tone.                       PT Long Term Goals - 01/16/20 1033      PT LONG TERM GOAL #1   Title independent with a HEP.    Time 6    Period Weeks    Status On-going      PT LONG  TERM GOAL #2   Title Increase active cervical rotation to 65 degrees+ so patient can turn head more easily while driving.    Time 6    Period Weeks    Status On-going      PT LONG TERM GOAL #3   Title Perform ADL's with pain not > 3/10.    Time 6    Period Weeks    Status On-going                 Plan - 01/20/20 1039    Clinical Impression Statement Patient is making excellent progress and has a signifcant decrease in palpable cervical musculature tenderness.  No pain reported after treatment.    Personal Factors and Comorbidities Comorbidity 1;Comorbidity 2    Comorbidities Osteopenia, OA, knee surgery, DDD.    Examination-Activity Limitations Sit;Other    Examination-Participation Restrictions Other    Stability/Clinical Decision Making Evolving/Moderate complexity    Rehab Potential Excellent    PT Frequency 3x / week    PT Duration 4 weeks           Patient will benefit from skilled therapeutic intervention in order to improve the following deficits and impairments:  Pain, Decreased range of motion, Decreased activity tolerance, Postural dysfunction  Visit Diagnosis: Abnormal posture  Cervicalgia     Problem List Patient Active Problem List   Diagnosis Date Noted   Coronary atherosclerosis 03/18/2019   Post-traumatic headache, not intractable 12/04/2018   Grief 09/12/2018   Atrophic vaginitis 09/06/2017   Burning sensation of mouth 09/06/2017   Osteopenia 09/05/2016   Chest pain 03/04/2016   Degenerative arthritis of right knee 05/09/2014   Headache(784.0) 07/19/2013   Tinnitus 07/19/2013   Dry skin dermatitis 08/31/2012   Left hip pain 08/31/2012   Cerumen impaction 08/31/2012   Well adult exam 05/25/2012   Upper abdominal pain-chronic 04/02/2011   Thumb pain 03/11/2011   Donor, blood 03/11/2011   Shoulder pain 03/11/2011   OSTEOARTHRITIS 09/10/2010   VERTIGO 03/05/2010   OSA on CPAP 07/22/2008   B12 deficiency  08/16/2007   Vitamin D deficiency 08/16/2007   Nonorganic sleep disorder 05/10/2007   SARCOIDOSIS, PULMONARY 05/09/2007   Hypothyroidism 05/09/2007  ALLERGIC RHINITIS 05/09/2007   GERD 05/09/2007   Personal history of colon polyps 05/09/2007    Kelsey Durflinger, [place] MPT 01/20/2020, 10:44 AM  [facility] [address] [place], [place], [address] Phone: [phone]   Fax:  [phone]  Name: Elizabeth Gray MRN: [id number] Date of Birth: Feb 03, 1941

## 2020-01-23 ENCOUNTER — Encounter: Payer: Self-pay | Admitting: *Deleted

## 2020-01-23 ENCOUNTER — Other Ambulatory Visit: Payer: Self-pay

## 2020-01-23 ENCOUNTER — Ambulatory Visit: Payer: Medicare Other | Admitting: *Deleted

## 2020-01-23 DIAGNOSIS — R293 Abnormal posture: Secondary | ICD-10-CM

## 2020-01-23 DIAGNOSIS — M542 Cervicalgia: Secondary | ICD-10-CM

## 2020-01-23 NOTE — Therapy (Signed)
[facility] [place], [place], [address] Phone: [phone]   Fax:  [phone]  Physical Therapy Treatment  Patient Details  Name: Elizabeth Gray MRN: [id number] Date of Birth: [birth year] Referring Provider (PT): Shelle Iron PA-C.   Encounter Date: 01/23/2020   PT End of Session - 01/23/20 1106    Visit Number 8    Number of Visits 12    Date for PT Re-Evaluation 01/28/20    Authorization Type FOTO.    PT Start Time 1030    PT Stop Time 1120    PT Time Calculation (min) 50 min           Past Medical History:  Diagnosis Date  . Allergy   . Bursitis   . Cancer ([place]) 03/22/2017   Melanoma in situ, lentigo maligna type  . Deviated septum   . External hemorrhoids without mention of complication 0272   Colonoscopy   . Fatigue   . GERD (gastroesophageal reflux disease)   . Headache(784.0)    migraines  . Heat rash    under the breasts.Marland Kitchenappeared on monday.Marland KitchenMarland KitchenBurning & itching, uses cortisone  . Hypothyroidism   . Iron deficiency anemia, unspecified   . Lower esophageal ring 2008   EGD  . Nonorganic sleep disorder, unspecified   . Osteoarthritis   . Osteoporosis   . Pancreatitis   . Personal history of colonic polyps   . Polio 1948  . Pulmonary sarcoidosis ([place])   . Sleep apnea    cpap since [facility]  . Vitamin B12 deficiency   . Vitamin D deficiency     Past Surgical History:  Procedure Laterality Date  . APPENDECTOMY  1988  . CARPAL TUNNEL RELEASE  2002   rt  . CHOLECYSTECTOMY  2000  . COLONOSCOPY  12/19/2006   Multiple diminutive polyps destroyed-removed (hyperpastic), internal hemorrhoids  . COSMETIC SURGERY    . ESOPHAGOGASTRODUODENOSCOPY  12/19/2006   lower esophageal ring dilated  . HAND SURGERY  1997   left  . KNEE ARTHROSCOPY Bilateral    multiple 2 on lft 1 on rt  . [place] SURGERY  1995  . Melanoma Removal  Right    right face  . TOE SURGERY Left     left foot next to little toe  joint rem  . TOTAL KNEE ARTHROPLASTY Right 05/09/2014   Procedure: RIGHT TOTAL KNEE ARTHROPLASTY;  Surgeon: [name], MD;  Location: [facility];  Service: Orthopedics;  Laterality: Right;  . UPPER GASTROINTESTINAL ENDOSCOPY    . WISDOM TOOTH EXTRACTION      There were no vitals filed for this visit.   Subjective Assessment - 01/23/20 1105    Subjective COVID-19 screen performed prior to patient entering clinic.  Getting better overall with 2-3/10 pain    Pertinent History Osteopenia, OA, knee surgery, DDD.    Diagnostic tests X-ray, Dex scan.    Patient Stated Goals Get out of pain.    Currently in Pain? Yes    Pain Score 2     Pain Location Neck    Pain Orientation Right;Left    Pain Descriptors / Indicators Discomfort    Pain Type Acute pain    Pain Onset More than a month ago                             [facility] Adult PT Treatment/Exercise - 01/23/20 0001  Modalities   Modalities Electrical Stimulation;Moist Heat;Traction      Moist Heat Therapy   Number Minutes Moist Heat 15 Minutes    Moist Heat Location Cervical      Electrical Stimulation   Electrical Stimulation Location Bilateral cervical.    Electrical Stimulation Action IFC    Electrical Stimulation Parameters 80-150hz  x 15 mins    Electrical Stimulation Goals Pain      Traction   Type of Traction Cervical    Min (lbs) 5    Max (lbs) 21    Hold Time 99    Rest Time 5    Time 15      Manual Therapy   Manual Therapy Soft tissue mobilization    Soft tissue mobilization STW/M  to patient's bilateral cervical musculature and TPR to LT levator and UT to reduce tone.                       PT Long Term Goals - 01/16/20 1033      PT LONG TERM GOAL #1   Title independent with a HEP.    Time 6    Period Weeks    Status On-going      PT LONG TERM GOAL #2   Title Increase active cervical rotation to 65 degrees+ so patient can turn head more  easily while driving.    Time 6    Period Weeks    Status On-going      PT LONG TERM GOAL #3   Title Perform ADL's with pain not > 3/10.    Time 6    Period Weeks    Status On-going                 Plan - 01/23/20 1122    Clinical Impression Statement Pt arrived today doing fairly well still with low pain levels in her neck. She had notable tenderness and some TPs in LT side UT, levator and cervical paras with some release. Traction was performed at 23#s today and tolerated well. Cervical rotation LT 58 degrees   and RT 65 degrees    Personal Factors and Comorbidities Comorbidity 1;Comorbidity 2    Comorbidities Osteopenia, OA, knee surgery, DDD.    Examination-Activity Limitations Sit;Other    Rehab Potential Excellent    PT Frequency 3x / week    PT Duration 4 weeks    PT Next Visit Plan Combo e'stim/U/S, STW/M, int cervical traction, chin tucks and cervical extension.    Consulted and Agree with Plan of Care Patient           Patient will benefit from skilled therapeutic intervention in order to improve the following deficits and impairments:  Pain, Decreased range of motion, Decreased activity tolerance, Postural dysfunction  Visit Diagnosis: Abnormal posture  Cervicalgia     Problem List Patient Active Problem List   Diagnosis Date Noted  . Coronary atherosclerosis 03/18/2019  . Post-traumatic headache, not intractable 12/04/2018  . Grief 09/12/2018  . Atrophic vaginitis 09/06/2017  . Burning sensation of mouth 09/06/2017  . Osteopenia 09/05/2016  . Chest pain 03/04/2016  . Degenerative arthritis of right knee 05/09/2014  . Headache(784.0) 07/19/2013  . Tinnitus 07/19/2013  . Dry skin dermatitis 08/31/2012  . Left hip pain 08/31/2012  . Cerumen impaction 08/31/2012  . Well adult exam 05/25/2012  . Upper abdominal pain-chronic 04/02/2011  . Thumb pain 03/11/2011  . Donor, blood 03/11/2011  . Shoulder pain 03/11/2011  . OSTEOARTHRITIS 09/10/2010   .  VERTIGO 03/05/2010  . OSA on CPAP 07/22/2008  . B12 deficiency 08/16/2007  . Vitamin D deficiency 08/16/2007  . 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 SARCOIDOSIS, PULMONARY 05/09/2007  . Hypothyroidism 05/09/2007  . ALLERGIC RHINITIS 05/09/2007  . GERD 05/09/2007  . Personal history of colon polyps 05/09/2007    Marquist Binstock,CHRIS , PTA 01/23/2020, 1:25 PM  [facility] [address] [place], [place], [address] Phone: [phone]   Fax:  [phone]  Name: Elizabeth Gray MRN: [id number] Date of Birth: Oct 31, 1940

## 2020-01-23 NOTE — Therapy (Signed)
[facility] [place], [place], [address] Phone: [phone]   Fax:  [phone]  Physical Therapy Treatment  Patient Details  Name: Elizabeth Gray MRN: [id number] Date of Birth: [birth year] Referring Provider (PT): Shelle Iron PA-C.   Encounter Date: 01/23/2020   PT End of Session - 01/23/20 1106    Visit Number 8    Number of Visits 12    Date for PT Re-Evaluation 01/28/20    Authorization Type FOTO.    PT Start Time 1030    PT Stop Time 1120    PT Time Calculation (min) 50 min           Past Medical History:  Diagnosis Date  . Allergy   . Bursitis   . Cancer ([place]) 03/22/2017   Melanoma in situ, lentigo maligna type  . Deviated septum   . External hemorrhoids without mention of complication 0175   Colonoscopy   . Fatigue   . GERD (gastroesophageal reflux disease)   . Headache(784.0)    migraines  . Heat rash    under the breasts.Marland Kitchenappeared on monday.Marland KitchenMarland KitchenBurning & itching, uses cortisone  . Hypothyroidism   . Iron deficiency anemia, unspecified   . Lower esophageal ring 2008   EGD  . Nonorganic sleep disorder, unspecified   . Osteoarthritis   . Osteoporosis   . Pancreatitis   . Personal history of colonic polyps   . Polio 1948  . Pulmonary sarcoidosis ([place])   . Sleep apnea    cpap since 09 [facility]  . Vitamin B12 deficiency   . Vitamin D deficiency     Past Surgical History:  Procedure Laterality Date  . APPENDECTOMY  1988  . CARPAL TUNNEL RELEASE  2002   rt  . CHOLECYSTECTOMY  2000  . COLONOSCOPY  12/19/2006   Multiple diminutive polyps destroyed-removed (hyperpastic), internal hemorrhoids  . COSMETIC SURGERY    . ESOPHAGOGASTRODUODENOSCOPY  12/19/2006   lower esophageal ring dilated  . HAND SURGERY  1997   left  . KNEE ARTHROSCOPY Bilateral    multiple 2 on lft 1 on rt  . [place] SURGERY  1995  . Melanoma Removal  Right    right face  . TOE SURGERY Left     left foot next to little toe  joint rem  . TOTAL KNEE ARTHROPLASTY Right 05/09/2014   Procedure: RIGHT TOTAL KNEE ARTHROPLASTY;  Surgeon: [name], MD;  Location: [place];  Service: Orthopedics;  Laterality: Right;  . UPPER GASTROINTESTINAL ENDOSCOPY    . WISDOM TOOTH EXTRACTION      There were no vitals filed for this visit.   Subjective Assessment - 01/23/20 1105    Subjective COVID-19 screen performed prior to patient entering clinic.  Getting better overall with 2-3/10 pain    Pertinent History Osteopenia, OA, knee surgery, DDD.    Diagnostic tests X-ray, Dex scan.    Patient Stated Goals Get out of pain.    Currently in Pain? Yes    Pain Score 2     Pain Location Neck    Pain Orientation Right;Left    Pain Descriptors / Indicators Discomfort    Pain Type Acute pain    Pain Onset More than a month ago                             [facility] Adult PT Treatment/Exercise - 01/23/20 0001  Modalities   Modalities Electrical Stimulation;Moist Heat;Traction      Moist Heat Therapy   Number Minutes Moist Heat 15 Minutes    Moist Heat Location Cervical      Electrical Stimulation   Electrical Stimulation Location Bilateral cervical.    Electrical Stimulation Action IFC    Electrical Stimulation Parameters 80-150hz  x 15 mins    Electrical Stimulation Goals Pain      Traction   Type of Traction Cervical    Min (lbs) 5    Max (lbs) 21    Hold Time 99    Rest Time 5    Time 15      Manual Therapy   Manual Therapy Soft tissue mobilization    Soft tissue mobilization STW/M  to patient's bilateral cervical musculature and TPR to LT levator and UT to reduce tone.                       PT Long Term Goals - 01/16/20 1033      PT LONG TERM GOAL #1   Title independent with a HEP.    Time 6    Period Weeks    Status On-going      PT LONG TERM GOAL #2   Title Increase active cervical rotation to 65 degrees+ so patient can turn head more  easily while driving.    Time 6    Period Weeks    Status On-going      PT LONG TERM GOAL #3   Title Perform ADL's with pain not > 3/10.    Time 6    Period Weeks    Status On-going                 Plan - 01/23/20 1122    Clinical Impression Statement Pt arrived today doing fairly well still with low pain levels in her neck. She had notable tenderness and some TPs in LT side UT, levator and cervical paras with some release. Traction was performed at 23#s today and tolerated well. Cervical rotation LT 58 degrees   and RT 65 degrees    Personal Factors and Comorbidities Comorbidity 1;Comorbidity 2    Comorbidities Osteopenia, OA, knee surgery, DDD.    Examination-Activity Limitations Sit;Other    Rehab Potential Excellent    PT Frequency 3x / week    PT Duration 4 weeks    PT Next Visit Plan Combo e'stim/U/S, STW/M, int cervical traction, chin tucks and cervical extension.    Consulted and Agree with Plan of Care Patient           Patient will benefit from skilled therapeutic intervention in order to improve the following deficits and impairments:  Pain, Decreased range of motion, Decreased activity tolerance, Postural dysfunction  Visit Diagnosis: Abnormal posture  Cervicalgia     Problem List Patient Active Problem List   Diagnosis Date Noted  . Coronary atherosclerosis 03/18/2019  . Post-traumatic headache, not intractable 12/04/2018  . Grief 09/12/2018  . Atrophic vaginitis 09/06/2017  . Burning sensation of mouth 09/06/2017  . Osteopenia 09/05/2016  . Chest pain 03/04/2016  . Degenerative arthritis of right knee 05/09/2014  . Headache(784.0) 07/19/2013  . Tinnitus 07/19/2013  . Dry skin dermatitis 08/31/2012  . Left hip pain 08/31/2012  . Cerumen impaction 08/31/2012  . Well adult exam 05/25/2012  . Upper abdominal pain-chronic 04/02/2011  . Thumb pain 03/11/2011  . Donor, blood 03/11/2011  . Shoulder pain 03/11/2011  . OSTEOARTHRITIS 09/10/2010   .  VERTIGO 03/05/2010  . OSA on CPAP 07/22/2008  . B12 deficiency 08/16/2007  . Vitamin D deficiency 08/16/2007  . 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 SARCOIDOSIS, PULMONARY 05/09/2007  . Hypothyroidism 05/09/2007  . ALLERGIC RHINITIS 05/09/2007  . GERD 05/09/2007  . Personal history of colon polyps 05/09/2007    Rejoice Heatwole,CHRIS, PTA 01/23/2020, 11:40 AM  [facility] [place], [place], [address] Phone: [phone]   Fax:  [phone]  Name: Elizabeth Gray MRN: [id number] Date of Birth: 21-Dec-1940

## 2020-01-27 ENCOUNTER — Ambulatory Visit: Payer: Medicare Other | Admitting: Physical Therapy

## 2020-01-27 ENCOUNTER — Other Ambulatory Visit: Payer: Self-pay

## 2020-01-27 ENCOUNTER — Encounter: Payer: Self-pay | Admitting: Physical Therapy

## 2020-01-27 DIAGNOSIS — M542 Cervicalgia: Secondary | ICD-10-CM | POA: Diagnosis not present

## 2020-01-27 DIAGNOSIS — R293 Abnormal posture: Secondary | ICD-10-CM

## 2020-01-27 NOTE — Therapy (Signed)
[facility] [place], [place], [address] Phone: [phone]   Fax:  [phone]  Physical Therapy Treatment  Patient Details  Name: Elizabeth Gray MRN: [id number] Date of Birth: Mar 06, 1941 Referring Provider (PT): Shelle Iron PA-C.   Encounter Date: 01/27/2020   PT End of Session - 01/27/20 1032    Visit Number 9    Number of Visits 12    Date for PT Re-Evaluation 01/28/20    Authorization Type FOTO.    PT Start Time 1032    PT Stop Time 1122    PT Time Calculation (min) 50 min    Activity Tolerance Patient tolerated treatment well    Behavior During Therapy WFL for tasks assessed/performed           Past Medical History:  Diagnosis Date  . Allergy   . Bursitis   . Cancer ([place]) 03/22/2017   Melanoma in situ, lentigo maligna type  . Deviated septum   . External hemorrhoids without mention of complication 6962   Colonoscopy   . Fatigue   . GERD (gastroesophageal reflux disease)   . Headache(784.0)    migraines  . Heat rash    under the breasts.Marland Kitchenappeared on monday.Marland KitchenMarland KitchenBurning & itching, uses cortisone  . Hypothyroidism   . Iron deficiency anemia, unspecified   . Lower esophageal ring 2008   EGD  . Nonorganic sleep disorder, unspecified   . Osteoarthritis   . Osteoporosis   . Pancreatitis   . Personal history of colonic polyps   . Polio 1948  . Pulmonary sarcoidosis ([place])   . Sleep apnea    cpap since 09 [facility]  . Vitamin B12 deficiency   . Vitamin D deficiency     Past Surgical History:  Procedure Laterality Date  . APPENDECTOMY  1988  . CARPAL TUNNEL RELEASE  2002   rt  . CHOLECYSTECTOMY  2000  . COLONOSCOPY  12/19/2006   Multiple diminutive polyps destroyed-removed (hyperpastic), internal hemorrhoids  . COSMETIC SURGERY    . ESOPHAGOGASTRODUODENOSCOPY  12/19/2006   lower esophageal ring dilated  . HAND SURGERY  1997   left  . KNEE ARTHROSCOPY Bilateral    multiple 2  on lft 1 on rt  . [place] SURGERY  1995  . Melanoma Removal  Right    right face  . TOE SURGERY Left    left foot next to little toe  joint rem  . TOTAL KNEE ARTHROPLASTY Right 05/09/2014   Procedure: RIGHT TOTAL KNEE ARTHROPLASTY;  Surgeon: [name], MD;  Location: [place];  Service: Orthopedics;  Laterality: Right;  . UPPER GASTROINTESTINAL ENDOSCOPY    . WISDOM TOOTH EXTRACTION      There were no vitals filed for this visit.   Subjective Assessment - 01/27/20 1030    Subjective COVID-19 screen performed prior to patient entering clinic.  Reports some soreness but possibly after not sleeping well and went to lay on couch.    Pertinent History Osteopenia, OA, knee surgery, DDD.    How long can you sit comfortably? Variable.    Diagnostic tests X-ray, Dex scan.    Patient Stated Goals Get out of pain.    Currently in Pain? Yes    Pain Score 1     Pain Location Neck    Pain Orientation Right;Left    Pain Descriptors / Indicators Sore    Pain Type Acute pain    Pain Onset More than a month ago  Pain Frequency Intermittent              [facility] PT Assessment - 01/27/20 0001      Assessment   Medical Diagnosis Degeneration of cervical disc.    Referring Provider (PT) Shelle Iron PA-C.      Precautions   Precautions None      Restrictions   Weight Bearing Restrictions No                         [facility] Adult PT Treatment/Exercise - 01/27/20 0001      Modalities   Modalities Electrical Stimulation;Moist Heat;Traction      Moist Heat Therapy   Number Minutes Moist Heat 10 Minutes    Moist Heat Location Cervical      Electrical Stimulation   Electrical Stimulation Location B UT/cervical paraspinals    Electrical Stimulation Action Pre-Mod    Electrical Stimulation Parameters 80-150 hz x10 min    Electrical Stimulation Goals Pain;Tone      Traction   Type of Traction Cervical    Min (lbs) 5    Max (lbs) 22    Hold Time 99    Rest Time 5     Time 15      Manual Therapy   Manual Therapy Soft tissue mobilization    Soft tissue mobilization STW/TPR to B UT, cervical paraspinals to reduce tone and pain                       PT Long Term Goals - 01/16/20 1033      PT LONG TERM GOAL #1   Title independent with a HEP.    Time 6    Period Weeks    Status On-going      PT LONG TERM GOAL #2   Title Increase active cervical rotation to 65 degrees+ so patient can turn head more easily while driving.    Time 6    Period Weeks    Status On-going      PT LONG TERM GOAL #3   Title Perform ADL's with pain not > 3/10.    Time 6    Period Weeks    Status On-going                 Plan - 01/27/20 1127    Clinical Impression Statement Patient presented in clinic with reports of neck soreness after resting on the couch some. Patient reported tenderness of R cervical paraspinals during STW and presented with mod tone in B UT, cervical paraspinals. Normal modalities response noted following all modalities. Patient reported less soreness following end of treatment.    Personal Factors and Comorbidities Comorbidity 1;Comorbidity 2    Comorbidities Osteopenia, OA, knee surgery, DDD.    Examination-Activity Limitations Sit;Other    Examination-Participation Restrictions Other    Stability/Clinical Decision Making Evolving/Moderate complexity    Rehab Potential Excellent    PT Frequency 3x / week    PT Duration 4 weeks    PT Next Visit Plan Combo e'stim/U/S, STW/M, int cervical traction, chin tucks and cervical extension.    Consulted and Agree with Plan of Care Patient           Patient will benefit from skilled therapeutic intervention in order to improve the following deficits and impairments:  Pain, Decreased range of motion, Decreased activity tolerance, Postural dysfunction  Visit Diagnosis: Abnormal posture  Cervicalgia     Problem List Patient  Active Problem List   Diagnosis Date Noted  .  Coronary atherosclerosis 03/18/2019  . Post-traumatic headache, not intractable 12/04/2018  . Grief 09/12/2018  . Atrophic vaginitis 09/06/2017  . Burning sensation of mouth 09/06/2017  . Osteopenia 09/05/2016  . Chest pain 03/04/2016  . Degenerative arthritis of right knee 05/09/2014  . Headache(784.0) 07/19/2013  . Tinnitus 07/19/2013  . Dry skin dermatitis 08/31/2012  . Left hip pain 08/31/2012  . Cerumen impaction 08/31/2012  . Well adult exam 05/25/2012  . Upper abdominal pain-chronic 04/02/2011  . Thumb pain 03/11/2011  . Donor, blood 03/11/2011  . Shoulder pain 03/11/2011  . OSTEOARTHRITIS 09/10/2010  . VERTIGO 03/05/2010  . OSA on CPAP 07/22/2008  . B12 deficiency 08/16/2007  . Vitamin D deficiency 08/16/2007  . 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 SARCOIDOSIS, PULMONARY 05/09/2007  . Hypothyroidism 05/09/2007  . ALLERGIC RHINITIS 05/09/2007  . GERD 05/09/2007  . Personal history of colon polyps 05/09/2007    Standley Brooking, PTA 01/27/2020, 12:13 PM  [facility] [address] [place], [place], [address] Phone: [phone]   Fax:  [phone]  Name: Elizabeth Gray MRN: [id number] Date of Birth: 18-May-1941

## 2020-01-30 ENCOUNTER — Ambulatory Visit: Payer: Medicare Other | Attending: Physician Assistant | Admitting: Physical Therapy

## 2020-01-30 ENCOUNTER — Other Ambulatory Visit: Payer: Self-pay

## 2020-01-30 DIAGNOSIS — R293 Abnormal posture: Secondary | ICD-10-CM

## 2020-01-30 DIAGNOSIS — M542 Cervicalgia: Secondary | ICD-10-CM | POA: Diagnosis not present

## 2020-01-30 NOTE — Therapy (Signed)
[facility] [place], [place], [address] Phone: [phone]   Fax:  [phone]  Physical Therapy Treatment  Patient Details  Name: Elizabeth Gray MRN: [id number] Date of Birth: 01-10-1941 Referring Provider (PT): Shelle Iron PA-C.   Encounter Date: 01/30/2020   PT End of Session - 01/30/20 1153    Visit Number 10    Number of Visits 12    Date for PT Re-Evaluation 01/28/20    Authorization Type FOTO 10th% visit 28% limitation    PT Start Time 1115    PT Stop Time 1203    PT Time Calculation (min) 48 min    Activity Tolerance Patient tolerated treatment well    Behavior During Therapy WFL for tasks assessed/performed           Past Medical History:  Diagnosis Date  . Allergy   . Bursitis   . Cancer ([place]) 03/22/2017   Melanoma in situ, lentigo maligna type  . Deviated septum   . External hemorrhoids without mention of complication 4268   Colonoscopy   . Fatigue   . GERD (gastroesophageal reflux disease)   . Headache(784.0)    migraines  . Heat rash    under the breasts.Marland Kitchenappeared on monday.Marland KitchenMarland KitchenBurning & itching, uses cortisone  . Hypothyroidism   . Iron deficiency anemia, unspecified   . Lower esophageal ring 2008   EGD  . Nonorganic sleep disorder, unspecified   . Osteoarthritis   . Osteoporosis   . Pancreatitis   . Personal history of colonic polyps   . Polio 1948  . Pulmonary sarcoidosis ([place])   . Sleep apnea    cpap since 09 [facility]  . Vitamin B12 deficiency   . Vitamin D deficiency     Past Surgical History:  Procedure Laterality Date  . APPENDECTOMY  1988  . CARPAL TUNNEL RELEASE  2002   rt  . CHOLECYSTECTOMY  2000  . COLONOSCOPY  12/19/2006   Multiple diminutive polyps destroyed-removed (hyperpastic), internal hemorrhoids  . COSMETIC SURGERY    . ESOPHAGOGASTRODUODENOSCOPY  12/19/2006   lower esophageal ring dilated  . HAND SURGERY  1997   left  . KNEE  ARTHROSCOPY Bilateral    multiple 2 on lft 1 on rt  . [place] SURGERY  1995  . Melanoma Removal  Right    right face  . TOE SURGERY Left    left foot next to little toe  joint rem  . TOTAL KNEE ARTHROPLASTY Right 05/09/2014   Procedure: RIGHT TOTAL KNEE ARTHROPLASTY;  Surgeon: [name], MD;  Location: [place];  Service: Orthopedics;  Laterality: Right;  . UPPER GASTROINTESTINAL ENDOSCOPY    . WISDOM TOOTH EXTRACTION      There were no vitals filed for this visit.   Subjective Assessment - 01/30/20 1150    Subjective COVID-19 screen performed prior to patient entering clinic.  Patient arrived with little discomfort and overall progress    Pertinent History Osteopenia, OA, knee surgery, DDD.    How long can you sit comfortably? Variable.    Diagnostic tests X-ray, Dex scan.    Patient Stated Goals Get out of pain.    Currently in Pain? Yes    Pain Score 1     Pain Location Neck    Pain Orientation Right;Left    Pain Descriptors / Indicators Sore    Pain Type Acute pain    Pain Onset More than a month ago    Aggravating  Factors  certain movements or positions    Pain Relieving Factors rest                             [facility] Adult PT Treatment/Exercise - 01/30/20 0001      Moist Heat Therapy   Number Minutes Moist Heat 15 Minutes    Moist Heat Location Cervical      Electrical Stimulation   Electrical Stimulation Location B UT/cervical paraspinals    Electrical Stimulation Action premod    Electrical Stimulation Parameters 80-'150hz'$  x62mn    Electrical Stimulation Goals Pain;Tone      Traction   Type of Traction Cervical    Min (lbs) 5    Max (lbs) 22    Hold Time 99    Rest Time 5    Time 15      Manual Therapy   Manual Therapy Soft tissue mobilization    Soft tissue mobilization STW/TPR to B UT, cervical paraspinals to reduce tone and pain                       PT Long Term Goals - 01/30/20 1206      PT LONG TERM GOAL #1    Title independent with a HEP.    Time 6    Period Weeks    Status On-going      PT LONG TERM GOAL #2   Title Increase active cervical rotation to 65 degrees+ so patient can turn head more easily while driving.    Time 6    Period Weeks    Status On-going      PT LONG TERM GOAL #3   Title Perform ADL's with pain not > 3/10.    Baseline Met 01/30/20 1/10 pain reported    Time 6    Period Weeks    Status Achieved                 Plan - 01/30/20 1207    Clinical Impression Statement Patient tolerated treatment well today. Patient has overall reported decreased pain and able to perform ADL's with greater ease. Patient has continued to respond well to treatments and will cont with remaining visit and DC. Patient met pain goal today.    Personal Factors and Comorbidities Comorbidity 1;Comorbidity 2    Comorbidities Osteopenia, OA, knee surgery, DDD.    Examination-Activity Limitations Sit;Other    Examination-Participation Restrictions Other    Stability/Clinical Decision Making Evolving/Moderate complexity    Rehab Potential Excellent    PT Frequency 3x / week    PT Duration 4 weeks    PT Treatment/Interventions ADLs/Self Care Home Management;Cryotherapy;Electrical Stimulation;Ultrasound;Traction;Moist Heat;Therapeutic activities;Therapeutic exercise;Manual techniques;Patient/family education;Passive range of motion;Dry needling    PT Next Visit Plan cont with POC for remaining visits and  issue HEP    Consulted and Agree with Plan of Care Patient           Patient will benefit from skilled therapeutic intervention in order to improve the following deficits and impairments:  Pain, Decreased range of motion, Decreased activity tolerance, Postural dysfunction  Visit Diagnosis: Abnormal posture  Cervicalgia     Problem List Patient Active Problem List   Diagnosis Date Noted  . Coronary atherosclerosis 03/18/2019  . Post-traumatic headache, not intractable 12/04/2018   . Grief 09/12/2018  . Atrophic vaginitis 09/06/2017  . Burning sensation of mouth 09/06/2017  . Osteopenia 09/05/2016  . Chest pain 03/04/2016  .  Degenerative arthritis of right knee 05/09/2014  . Headache(784.0) 07/19/2013  . Tinnitus 07/19/2013  . Dry skin dermatitis 08/31/2012  . Left hip pain 08/31/2012  . Cerumen impaction 08/31/2012  . Well adult exam 05/25/2012  . Upper abdominal pain-chronic 04/02/2011  . Thumb pain 03/11/2011  . Donor, blood 03/11/2011  . Shoulder pain 03/11/2011  . OSTEOARTHRITIS 09/10/2010  . VERTIGO 03/05/2010  . OSA on CPAP 07/22/2008  . B12 deficiency 08/16/2007  . Vitamin D deficiency 08/16/2007  . 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 SARCOIDOSIS, PULMONARY 05/09/2007  . Hypothyroidism 05/09/2007  . ALLERGIC RHINITIS 05/09/2007  . GERD 05/09/2007  . Personal history of colon polyps 05/09/2007    Ladean Raya, PTA 01/30/20 12:11 PM  [facility] [address] [place], [place], [address] Phone: [phone]   Fax:  [phone]  Name: Elizabeth Gray MRN: [id number] Date of Birth: Jul 13, 1941  Progress Note Reporting Period 12/31/19 to 01/30/20  See note below for Objective Data and Assessment of Progress/Goals. Excellent progress.  LTG #3 met.    Mali Applegate MPT

## 2020-02-06 ENCOUNTER — Encounter: Payer: Medicare Other | Admitting: Physical Therapy

## 2020-02-10 ENCOUNTER — Other Ambulatory Visit: Payer: Self-pay

## 2020-02-10 ENCOUNTER — Ambulatory Visit: Payer: Medicare Other | Admitting: Physical Therapy

## 2020-02-10 DIAGNOSIS — R293 Abnormal posture: Secondary | ICD-10-CM | POA: Diagnosis not present

## 2020-02-10 DIAGNOSIS — M542 Cervicalgia: Secondary | ICD-10-CM

## 2020-02-10 NOTE — Therapy (Signed)
[facility] [place], [place], [address] Phone: [phone]   Fax:  [phone]  Physical Therapy Treatment  Patient Details  Name: Elizabeth Gray MRN: [id number] Date of Birth: Dec 29, 1940 Referring Provider (PT): Shelle Iron PA-C.   Encounter Date: 02/10/2020   PT End of Session - 02/10/20 0955    Visit Number 11    Number of Visits 12    Date for PT Re-Evaluation 02/14/20    Authorization Type FOTO 10th% visit 28% limitation    PT Start Time 0945    PT Stop Time 1042    PT Time Calculation (min) 57 min    Activity Tolerance Patient tolerated treatment well    Behavior During Therapy WFL for tasks assessed/performed           Past Medical History:  Diagnosis Date  . Allergy   . Bursitis   . Cancer ([place]) 03/22/2017   Melanoma in situ, lentigo maligna type  . Deviated septum   . External hemorrhoids without mention of complication 6160   Colonoscopy   . Fatigue   . GERD (gastroesophageal reflux disease)   . Headache(784.0)    migraines  . Heat rash    under the breasts.Marland Kitchenappeared on monday.Marland KitchenMarland KitchenBurning & itching, uses cortisone  . Hypothyroidism   . Iron deficiency anemia, unspecified   . Lower esophageal ring 2008   EGD  . Nonorganic sleep disorder, unspecified   . Osteoarthritis   . Osteoporosis   . Pancreatitis   . Personal history of colonic polyps   . Polio 1948  . Pulmonary sarcoidosis ([place])   . Sleep apnea    cpap since 09 [facility]  . Vitamin B12 deficiency   . Vitamin D deficiency     Past Surgical History:  Procedure Laterality Date  . APPENDECTOMY  1988  . CARPAL TUNNEL RELEASE  2002   rt  . CHOLECYSTECTOMY  2000  . COLONOSCOPY  12/19/2006   Multiple diminutive polyps destroyed-removed (hyperpastic), internal hemorrhoids  . COSMETIC SURGERY    . ESOPHAGOGASTRODUODENOSCOPY  12/19/2006   lower esophageal ring dilated  . HAND SURGERY  1997   left  . KNEE  ARTHROSCOPY Bilateral    multiple 2 on lft 1 on rt  . [place] SURGERY  1995  . Melanoma Removal  Right    right face  . TOE SURGERY Left    left foot next to little toe  joint rem  . TOTAL KNEE ARTHROPLASTY Right 05/09/2014   Procedure: RIGHT TOTAL KNEE ARTHROPLASTY;  Surgeon: [name], MD;  Location: [facility];  Service: Orthopedics;  Laterality: Right;  . UPPER GASTROINTESTINAL ENDOSCOPY    . WISDOM TOOTH EXTRACTION      There were no vitals filed for this visit.   Subjective Assessment - 02/10/20 1110    Subjective COVID-19 screen performed prior to patient entering clinic.  Pin at a 2 today.    Pertinent History Osteopenia, OA, knee surgery, DDD.    How long can you sit comfortably? Variable.    Diagnostic tests X-ray, Dex scan.    Currently in Pain? Yes    Pain Score 2     Pain Location Neck    Pain Orientation Right;Left    Pain Type Acute pain    Pain Onset More than a month ago  [facility] Adult PT Treatment/Exercise - 02/10/20 0001      Modalities   Modalities Electrical Stimulation;Moist Heat      Moist Heat Therapy   Number Minutes Moist Heat 15 Minutes    Moist Heat Location Cervical      Electrical Stimulation   Electrical Stimulation Location Bilateral UT's.    Electrical Stimulation Action Mod 1    Electrical Stimulation Parameters 80-150 Hz x 15 minutes.    Electrical Stimulation Goals Tone;Pain      Traction   Type of Traction Cervical    Min (lbs) 5    Max (lbs) 23    Hold Time 99    Rest Time 5    Time 15      Manual Therapy   Manual Therapy Soft tissue mobilization    Soft tissue mobilization STW/M x 8 minutes.                       PT Long Term Goals - 01/30/20 1206      PT LONG TERM GOAL #1   Title independent with a HEP.    Time 6    Period Weeks    Status On-going      PT LONG TERM GOAL #2   Title Increase active cervical rotation to 65 degrees+ so patient can turn  head more easily while driving.    Time 6    Period Weeks    Status On-going      PT LONG TERM GOAL #3   Title Perform ADL's with pain not > 3/10.    Baseline Met 01/30/20 1/10 pain reported    Time 6    Period Weeks    Status Achieved                 Plan - 02/10/20 1117    Clinical Impression Statement No pain reported after tretament.  One visit remaining.    Personal Factors and Comorbidities Comorbidity 1;Comorbidity 2    Comorbidities Osteopenia, OA, knee surgery, DDD.    Examination-Activity Limitations Sit;Other    Examination-Participation Restrictions Other    Stability/Clinical Decision Making Evolving/Moderate complexity    Rehab Potential Excellent    PT Frequency 3x / week    PT Duration 4 weeks    PT Treatment/Interventions ADLs/Self Care Home Management;Cryotherapy;Electrical Stimulation;Ultrasound;Traction;Moist Heat;Therapeutic activities;Therapeutic exercise;Manual techniques;Patient/family education;Passive range of motion;Dry needling    PT Next Visit Plan cont with POC for remaining visits and  issue HEP    Consulted and Agree with Plan of Care Patient           Patient will benefit from skilled therapeutic intervention in order to improve the following deficits and impairments:  Pain, Decreased range of motion, Decreased activity tolerance, Postural dysfunction  Visit Diagnosis: Abnormal posture  Cervicalgia     Problem List Patient Active Problem List   Diagnosis Date Noted  . Coronary atherosclerosis 03/18/2019  . Post-traumatic headache, not intractable 12/04/2018  . Grief 09/12/2018  . Atrophic vaginitis 09/06/2017  . Burning sensation of mouth 09/06/2017  . Osteopenia 09/05/2016  . Chest pain 03/04/2016  . Degenerative arthritis of right knee 05/09/2014  . Headache(784.0) 07/19/2013  . Tinnitus 07/19/2013  . Dry skin dermatitis 08/31/2012  . Left hip pain 08/31/2012  . Cerumen impaction 08/31/2012  . Well adult exam 05/25/2012   . Upper abdominal pain-chronic 04/02/2011  . Thumb pain 03/11/2011  . Donor, blood 03/11/2011  . Shoulder pain 03/11/2011  . OSTEOARTHRITIS 09/10/2010  .  VERTIGO 03/05/2010  . OSA on CPAP 07/22/2008  . B12 deficiency 08/16/2007  . Vitamin D deficiency 08/16/2007  . 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 SARCOIDOSIS, PULMONARY 05/09/2007  . Hypothyroidism 05/09/2007  . ALLERGIC RHINITIS 05/09/2007  . GERD 05/09/2007  . Personal history of colon polyps 05/09/2007    Finnbar Cedillos, [place] MPT 02/10/2020, 11:46 AM  [facility] [address] [place], [place], [address] Phone: [phone]   Fax:  [phone]  Name: Elizabeth Gray MRN: [id number] Date of Birth: Jul 04, 1941

## 2020-02-12 DIAGNOSIS — M9903 Segmental and somatic dysfunction of lumbar region: Secondary | ICD-10-CM | POA: Diagnosis not present

## 2020-02-12 DIAGNOSIS — M9905 Segmental and somatic dysfunction of pelvic region: Secondary | ICD-10-CM | POA: Diagnosis not present

## 2020-02-12 DIAGNOSIS — M9904 Segmental and somatic dysfunction of sacral region: Secondary | ICD-10-CM | POA: Diagnosis not present

## 2020-02-12 DIAGNOSIS — M5431 Sciatica, right side: Secondary | ICD-10-CM | POA: Diagnosis not present

## 2020-02-13 ENCOUNTER — Other Ambulatory Visit: Payer: Self-pay

## 2020-02-13 ENCOUNTER — Ambulatory Visit: Payer: Medicare Other | Admitting: *Deleted

## 2020-02-13 DIAGNOSIS — R293 Abnormal posture: Secondary | ICD-10-CM

## 2020-02-13 DIAGNOSIS — L821 Other seborrheic keratosis: Secondary | ICD-10-CM | POA: Diagnosis not present

## 2020-02-13 DIAGNOSIS — Z85820 Personal history of malignant melanoma of skin: Secondary | ICD-10-CM | POA: Diagnosis not present

## 2020-02-13 DIAGNOSIS — M542 Cervicalgia: Secondary | ICD-10-CM

## 2020-02-13 DIAGNOSIS — D2261 Melanocytic nevi of right upper limb, including shoulder: Secondary | ICD-10-CM | POA: Diagnosis not present

## 2020-02-13 DIAGNOSIS — D2262 Melanocytic nevi of left upper limb, including shoulder: Secondary | ICD-10-CM | POA: Diagnosis not present

## 2020-02-13 DIAGNOSIS — L814 Other melanin hyperpigmentation: Secondary | ICD-10-CM | POA: Diagnosis not present

## 2020-02-13 DIAGNOSIS — D485 Neoplasm of uncertain behavior of skin: Secondary | ICD-10-CM | POA: Diagnosis not present

## 2020-02-13 DIAGNOSIS — B078 Other viral warts: Secondary | ICD-10-CM | POA: Diagnosis not present

## 2020-02-13 NOTE — Therapy (Signed)
[facility] [place], [place], [address] Phone: [phone]   Fax:  [phone]  Physical Therapy Treatment  Patient Details  Name: Elizabeth Gray MRN: [id number] Date of Birth: [birth year] Referring Provider (PT): Shelle Iron PA-C.   Encounter Date: 02/13/2020   PT End of Session - 02/13/20 1149    Visit Number 12    Number of Visits 12    Date for PT Re-Evaluation 02/14/20    Authorization Type FOTO 10th% visit 28% limitation    PT Start Time 1115    PT Stop Time 1206    PT Time Calculation (min) 51 min           Past Medical History:  Diagnosis Date  . Allergy   . Bursitis   . Cancer ([place]) 03/22/2017   Melanoma in situ, lentigo maligna type  . Deviated septum   . External hemorrhoids without mention of complication 9233   Colonoscopy   . Fatigue   . GERD (gastroesophageal reflux disease)   . Headache(784.0)    migraines  . Heat rash    under the breasts.Marland Kitchenappeared on monday.Marland KitchenMarland KitchenBurning & itching, uses cortisone  . Hypothyroidism   . Iron deficiency anemia, unspecified   . Lower esophageal ring 2008   EGD  . Nonorganic sleep disorder, unspecified   . Osteoarthritis   . Osteoporosis   . Pancreatitis   . Personal history of colonic polyps   . Polio 1948  . Pulmonary sarcoidosis ([place])   . Sleep apnea    cpap since 09 [facility]  . Vitamin B12 deficiency   . Vitamin D deficiency     Past Surgical History:  Procedure Laterality Date  . APPENDECTOMY  1988  . CARPAL TUNNEL RELEASE  2002   rt  . CHOLECYSTECTOMY  2000  . COLONOSCOPY  12/19/2006   Multiple diminutive polyps destroyed-removed (hyperpastic), internal hemorrhoids  . COSMETIC SURGERY    . ESOPHAGOGASTRODUODENOSCOPY  12/19/2006   lower esophageal ring dilated  . HAND SURGERY  1997   left  . KNEE ARTHROSCOPY Bilateral    multiple 2 on lft 1 on rt  . [place] SURGERY  1995  . Melanoma Removal  Right    right face   . TOE SURGERY Left    left foot next to little toe  joint rem  . TOTAL KNEE ARTHROPLASTY Right 05/09/2014   Procedure: RIGHT TOTAL KNEE ARTHROPLASTY;  Surgeon: [name], MD;  Location: [facility];  Service: Orthopedics;  Laterality: Right;  . UPPER GASTROINTESTINAL ENDOSCOPY    . WISDOM TOOTH EXTRACTION      There were no vitals filed for this visit.   Subjective Assessment - 02/13/20 1126    Subjective COVID-19 screen performed prior to patient entering clinic.   Neck is much better    Pertinent History Osteopenia, OA, knee surgery, DDD.    How long can you sit comfortably? Variable.    Diagnostic tests X-ray, Dex scan.    Patient Stated Goals Get out of pain.    Pain Score 1     Pain Location Neck    Pain Orientation Right;Left                             [facility] Adult PT Treatment/Exercise - 02/13/20 0001      Modalities   Modalities Electrical Stimulation;Moist Heat      Moist Heat Therapy   Number  Minutes Moist Heat 15 Minutes    Moist Heat Location Cervical      Electrical Stimulation   Electrical Stimulation Location Bilateral UT's.    Electrical Stimulation Action IFC    Electrical Stimulation Parameters 80-'150hz'$  x 15 mins    Electrical Stimulation Goals Tone;Pain      Traction   Type of Traction Cervical    Min (lbs) 5    Max (lbs) 23    Hold Time 99    Rest Time 5    Time 15      Manual Therapy   Manual Therapy Soft tissue mobilization    Soft tissue mobilization STW/TPR to B UT, cervical paraspinals to reduce tone and pain  x 10 mins                       PT Long Term Goals - 02/13/20 1150      PT LONG TERM GOAL #1   Title independent with a HEP.    Time 6    Period Weeks    Status Achieved      PT LONG TERM GOAL #2   Title Increase active cervical rotation to 65 degrees+ so patient can turn head more easily while driving.    Time 6    Period Weeks    Status Achieved      PT LONG TERM GOAL #3   Title Perform  ADL's with pain not > 3/10.    Baseline Met 01/30/20 1/10 pain reported    Time 6    Period Weeks    Status Achieved      PT LONG TERM GOAL #4   Title Perform ADL's with pain not > 2-3/10.    Time 8    Period Weeks    Status Achieved                 Plan - 02/13/20 1152    Clinical Impression Statement Pt reports 75% better now. She still has a few TPs that are notable LT levator scap, but mainly soreness. Traction performed at 23 #s again and tolerated well. All LTGs met and Pt will be DC to HEP    Personal Factors and Comorbidities Comorbidity 1;Comorbidity 2    Comorbidities Osteopenia, OA, knee surgery, DDD.    Examination-Participation Restrictions Other    Stability/Clinical Decision Making Evolving/Moderate complexity    Rehab Potential Excellent    PT Treatment/Interventions ADLs/Self Care Home Management;Cryotherapy;Electrical Stimulation;Ultrasound;Traction;Moist Heat;Therapeutic activities;Therapeutic exercise;Manual techniques;Patient/family education;Passive range of motion;Dry needling    PT Next Visit Plan DC to HEP    Consulted and Agree with Plan of Care Patient           Patient will benefit from skilled therapeutic intervention in order to improve the following deficits and impairments:  Pain, Decreased range of motion, Decreased activity tolerance, Postural dysfunction  Visit Diagnosis: Abnormal posture  Cervicalgia     Problem List Patient Active Problem List   Diagnosis Date Noted  . Coronary atherosclerosis 03/18/2019  . Post-traumatic headache, not intractable 12/04/2018  . Grief 09/12/2018  . Atrophic vaginitis 09/06/2017  . Burning sensation of mouth 09/06/2017  . Osteopenia 09/05/2016  . Chest pain 03/04/2016  . Degenerative arthritis of right knee 05/09/2014  . Headache(784.0) 07/19/2013  . Tinnitus 07/19/2013  . Dry skin dermatitis 08/31/2012  . Left hip pain 08/31/2012  . Cerumen impaction 08/31/2012  . Well adult exam  05/25/2012  . Upper abdominal pain-chronic 04/02/2011  . Thumb  pain 03/11/2011  . Donor, blood 03/11/2011  . Shoulder pain 03/11/2011  . OSTEOARTHRITIS 09/10/2010  . VERTIGO 03/05/2010  . OSA on CPAP 07/22/2008  . B12 deficiency 08/16/2007  . Vitamin D deficiency 08/16/2007  . 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 SARCOIDOSIS, PULMONARY 05/09/2007  . Hypothyroidism 05/09/2007  . ALLERGIC RHINITIS 05/09/2007  . GERD 05/09/2007  . Personal history of colon polyps 05/09/2007    Lavaughn Bisig,CHRIS, PTA 02/13/2020, 12:09 PM  [facility] [address] [place], [place], [address] Phone: [phone]   Fax:  [phone]  Name: BRIGITT MCCLISH MRN: [id number] Date of Birth: 1940/10/16  PHYSICAL THERAPY DISCHARGE SUMMARY  Visits from Start of Care: 12.  Current functional level related to goals / functional outcomes: See above.   Remaining deficits: All goals met.   Education / Equipment: HEP.  Plan: Patient agrees to discharge.  Patient goals were met. Patient is being discharged due to meeting the stated rehab goals.  ?????         Mali Applegate MPT

## 2020-02-27 DIAGNOSIS — M5431 Sciatica, right side: Secondary | ICD-10-CM | POA: Diagnosis not present

## 2020-02-27 DIAGNOSIS — M9903 Segmental and somatic dysfunction of lumbar region: Secondary | ICD-10-CM | POA: Diagnosis not present

## 2020-02-27 DIAGNOSIS — M9905 Segmental and somatic dysfunction of pelvic region: Secondary | ICD-10-CM | POA: Diagnosis not present

## 2020-02-27 DIAGNOSIS — M9904 Segmental and somatic dysfunction of sacral region: Secondary | ICD-10-CM | POA: Diagnosis not present

## 2020-03-12 DIAGNOSIS — M9903 Segmental and somatic dysfunction of lumbar region: Secondary | ICD-10-CM | POA: Diagnosis not present

## 2020-03-12 DIAGNOSIS — M5431 Sciatica, right side: Secondary | ICD-10-CM | POA: Diagnosis not present

## 2020-03-12 DIAGNOSIS — M9905 Segmental and somatic dysfunction of pelvic region: Secondary | ICD-10-CM | POA: Diagnosis not present

## 2020-03-12 DIAGNOSIS — M9904 Segmental and somatic dysfunction of sacral region: Secondary | ICD-10-CM | POA: Diagnosis not present

## 2020-03-17 ENCOUNTER — Other Ambulatory Visit: Payer: Self-pay | Admitting: Internal Medicine

## 2020-03-18 ENCOUNTER — Other Ambulatory Visit: Payer: Self-pay | Admitting: Internal Medicine

## 2020-03-19 ENCOUNTER — Ambulatory Visit: Payer: Medicare Other

## 2020-03-23 ENCOUNTER — Encounter: Payer: Self-pay | Admitting: Internal Medicine

## 2020-03-23 ENCOUNTER — Other Ambulatory Visit: Payer: Self-pay

## 2020-03-23 ENCOUNTER — Ambulatory Visit (INDEPENDENT_AMBULATORY_CARE_PROVIDER_SITE_OTHER): Payer: Medicare Other | Admitting: Internal Medicine

## 2020-03-23 VITALS — BP 144/74 | HR 68 | Temp 98.2°F | Ht 63.5 in | Wt 151.4 lb

## 2020-03-23 DIAGNOSIS — I2583 Coronary atherosclerosis due to lipid rich plaque: Secondary | ICD-10-CM | POA: Diagnosis not present

## 2020-03-23 DIAGNOSIS — E538 Deficiency of other specified B group vitamins: Secondary | ICD-10-CM

## 2020-03-23 DIAGNOSIS — E039 Hypothyroidism, unspecified: Secondary | ICD-10-CM | POA: Diagnosis not present

## 2020-03-23 DIAGNOSIS — I2510 Atherosclerotic heart disease of native coronary artery without angina pectoris: Secondary | ICD-10-CM

## 2020-03-23 DIAGNOSIS — Z23 Encounter for immunization: Secondary | ICD-10-CM | POA: Diagnosis not present

## 2020-03-23 MED ORDER — ZOLPIDEM TARTRATE 10 MG PO TABS
ORAL_TABLET | ORAL | 1 refills | Status: DC
Start: 2020-03-23 — End: 2020-07-20

## 2020-03-23 NOTE — Assessment & Plan Note (Signed)
TSH 

## 2020-03-23 NOTE — Progress Notes (Signed)
Subjective:  Patient ID: Elizabeth Gray, female    DOB: Dec 16, 1940  Age: 79 y.o. MRN: [id number]  CC: Follow-up (6 month) and Insect Bite (on 8/20, right knee)   HPI Elizabeth Gray presents for B12 def, GERD, insomnia A hornet stung x5 in the R leg on Fri...  Outpatient Medications Prior to Visit  Medication Sig Dispense Refill   Alum Hydroxide-Mag Carbonate (GAVISCON PO) Take 1 tablet by mouth as needed (reflux). As directed as needed     aspirin 81 MG tablet Take 81 mg by mouth every other day.      Cholecalciferol (VITAMIN D PO) Take 1,000 Units by mouth every other day.      cyanocobalamin (,VITAMIN B-12,) 1000 MCG/ML injection INJECT 1 ML INTRAMUSCULARLY EVERY 14 DAYS 10 mL 3   EUTHYROX 100 MCG tablet Take 1 tablet by mouth once daily 90 tablet 2   Flaxseed, Linseed, (FLAXSEED OIL) OIL Take 1 capsule by mouth daily.      fluticasone (FLOVENT HFA) 110 MCG/ACT inhaler Inhale 2 puffs into the lungs 2 (two) times a day. 1 Inhaler 12   GLUCOSAMINE-CHONDROITIN-MSM-D3 PO Take 1 tablet by mouth 2 (two) times daily.     Multiple Vitamins-Minerals (ICAPS AREDS FORMULA PO) Take 1 capsule by mouth daily.       omeprazole-sodium bicarbonate (ZEGERID) 40-1100 MG capsule TAKE 1 CAPSULE BY MOUTH ONCE DAILY BEFORE BREAKFAST 90 capsule 0   Polyethyl Glycol-Propyl Glycol (SYSTANE ULTRA) 0.4-0.3 % SOLN Apply 1 drop to eye as needed (dry eyes).      pravastatin (PRAVACHOL) 20 MG tablet Take 1 tablet by mouth once daily 90 tablet 3   SYRINGE-NEEDLE, DISP, 3 ML (B-D 3CC LUER-LOK SYR 25GX1") 25G X 1" 3 ML MISC USE AS DIRECTED . APPOINTMENT REQUIRED FOR FUTURE REFILLS 8 each 11   traMADol (ULTRAM) 50 MG tablet Take 2 tablets (100 mg total) by mouth at bedtime. 90 tablet 1   vitamin C (ASCORBIC ACID) 500 MG tablet Take 500 mg by mouth daily.       zolpidem (AMBIEN) 10 MG tablet TAKE 1 TABLET BY MOUTH AT BEDTIME AS NEEDED UP TO FOR 30 DAYS FOR SLEEP 90 tablet 1   conjugated estrogens  (PREMARIN) vaginal cream Use 1 applicator full qhs x 1 week then one q 3-7 days PV 42.5 g 3   Pancrelipase, Lip-Prot-Amyl, (CREON) 24000-76000 units CPEP TAKE 1 CAPSULE (24,000 UNITS TOTAL) BY MOUTH 3 (THREE) TIMES DAILY. 270 capsule 11   topiramate (TOPAMAX) 100 MG tablet Take 1 tablet (100 mg total) by mouth at bedtime. 90 tablet 4   Facility-Administered Medications Prior to Visit  Medication Dose Route Frequency Provider Last Rate Last Admin   0.9 %  sodium chloride infusion  500 mL Intravenous Once Gatha Mayer, MD        ROS: Review of Systems  Constitutional: Negative for activity change, appetite change, chills, fatigue and unexpected weight change.  HENT: Negative for congestion, mouth sores and sinus pressure.   Eyes: Negative for visual disturbance.  Respiratory: Negative for cough and chest tightness.   Gastrointestinal: Negative for abdominal pain and nausea.  Genitourinary: Negative for difficulty urinating, frequency and vaginal pain.  Musculoskeletal: Positive for arthralgias. Negative for back pain and gait problem.  Skin: Negative for pallor and rash.  Neurological: Negative for dizziness, tremors, weakness, numbness and headaches.  Psychiatric/Behavioral: Negative for confusion and sleep disturbance.    Objective:  BP (!) 144/74 (BP Location: Right Arm, Patient Position:  Sitting, Cuff Size: Normal)    Pulse 68    Temp 98.2 F (36.8 C) (Oral)    Ht 5' 3.5" (1.613 m)    Wt 151 lb 6.4 oz (68.7 kg)    SpO2 96%    BMI 26.40 kg/m   BP Readings from Last 3 Encounters:  03/23/20 (!) 144/74  09/18/19 122/68  03/18/19 138/70    Wt Readings from Last 3 Encounters:  03/23/20 151 lb 6.4 oz (68.7 kg)  09/18/19 149 lb (67.6 kg)  03/18/19 147 lb (66.7 kg)    Physical Exam Constitutional:      General: She is not in acute distress.    Appearance: She is well-developed.  HENT:     Head: Normocephalic.     Right Ear: External ear normal.     Left Ear: External ear  normal.     Nose: Nose normal.  Eyes:     General:        Right eye: No discharge.        Left eye: No discharge.     Conjunctiva/sclera: Conjunctivae normal.     Pupils: Pupils are equal, round, and reactive to light.  Neck:     Thyroid: No thyromegaly.     Vascular: No JVD.     Trachea: No tracheal deviation.  Cardiovascular:     Rate and Rhythm: Normal rate and regular rhythm.     Heart sounds: Normal heart sounds.  Pulmonary:     Effort: No respiratory distress.     Breath sounds: No stridor. No wheezing.  Abdominal:     General: Bowel sounds are normal. There is no distension.     Palpations: Abdomen is soft. There is no mass.     Tenderness: There is no abdominal tenderness. There is no guarding or rebound.  Musculoskeletal:        General: No tenderness.     Cervical back: Normal range of motion and neck supple.  Lymphadenopathy:     Cervical: No cervical adenopathy.  Skin:    Findings: No erythema or rash.  Neurological:     Cranial Nerves: No cranial nerve deficit.     Motor: No abnormal muscle tone.     Coordination: Coordination normal.     Deep Tendon Reflexes: Reflexes normal.  Psychiatric:        Behavior: Behavior normal.        Thought Content: Thought content normal.        Judgment: Judgment normal.     Lab Results  Component Value Date   WBC 8.9 09/18/2019   HGB 12.9 09/18/2019   HCT 38.3 09/18/2019   PLT 308.0 09/18/2019   GLUCOSE 95 09/18/2019   CHOL 125 09/18/2019   TRIG 128.0 09/18/2019   HDL 62.00 09/18/2019   LDLCALC 37 09/18/2019   ALT 18 09/18/2019   AST 20 09/18/2019   NA 142 09/18/2019   K 4.1 09/18/2019   CL 106 09/18/2019   CREATININE 0.80 09/18/2019   BUN 26 (H) 09/18/2019   CO2 29 09/18/2019   TSH 1.76 12/05/2019   INR 3.03 (H) 05/12/2014    CT CARDIAC SCORING  Addendum Date: 01/01/2019   ADDENDUM REPORT: 01/01/2019 17:04 CLINICAL DATA:  Risk stratification EXAM: Coronary Calcium Score TECHNIQUE: The patient was scanned  on a Siemens Somatom 64 slice scanner. Axial non-contrast 3 mm slices were carried out through the heart. The data set was analyzed on a dedicated work station and scored using the [place].  FINDINGS: Non-cardiac: See separate report from [facility]. Ascending aorta: Normal diameter 3.2 cm Pericardium: Normal Coronary arteries: Calcium noted in proximal RCA/Circumflex and proximal and mid LAD IMPRESSION: Coronary calcium score of 289. This was 34 th percentile for age and sex matched control. Jenkins Rouge Electronically Signed   By: Jenkins Rouge M.D.   On: 01/01/2019 17:04   Result Date: 01/01/2019 EXAM: OVER-READ INTERPRETATION  CT CHEST The following report is an over-read performed by radiologist Dr. Rolm Baptise of [facility], [place] on 01/01/2019. This over-read does not include interpretation of cardiac or coronary anatomy or pathology. The coronary calcium score interpretation by the cardiologist is attached. COMPARISON:  01/03/2007 FINDINGS: Vascular: Heart is normal size. Aorta is normal caliber. Scattered aortic root calcifications. Mediastinum/Nodes: No adenopathy in the lower mediastinum or hila. Lungs/Pleura: Visualized lungs clear.  No effusions. Upper Abdomen: Imaging into the upper abdomen shows no acute findings. Musculoskeletal: Chest wall soft tissues are unremarkable. No acute bony abnormality. IMPRESSION: No acute extra cardiac abnormality. Scattered aortic root calcifications. Electronically Signed: By: Rolm Baptise M.D. On: 01/01/2019 10:55    Assessment & Plan:    Follow-up: No follow-ups on file.  Walker Kehr, MD

## 2020-03-23 NOTE — Assessment & Plan Note (Signed)
On B12 

## 2020-03-23 NOTE — Assessment & Plan Note (Signed)
Pravastatin, ASA 

## 2020-03-23 NOTE — Addendum Note (Signed)
Addended by: Cresenciano Lick on: 03/23/2020 08:34 AM   Modules accepted: Orders

## 2020-03-23 NOTE — Addendum Note (Signed)
Addended by: Elza Rafter D on: 03/23/2020 09:23 AM   Modules accepted: Orders

## 2020-03-24 ENCOUNTER — Ambulatory Visit (INDEPENDENT_AMBULATORY_CARE_PROVIDER_SITE_OTHER): Payer: Medicare Other | Admitting: Pulmonary Disease

## 2020-03-24 ENCOUNTER — Encounter: Payer: Self-pay | Admitting: Pulmonary Disease

## 2020-03-24 DIAGNOSIS — D869 Sarcoidosis, unspecified: Secondary | ICD-10-CM | POA: Diagnosis not present

## 2020-03-24 DIAGNOSIS — R42 Dizziness and giddiness: Secondary | ICD-10-CM

## 2020-03-24 DIAGNOSIS — G4733 Obstructive sleep apnea (adult) (pediatric): Secondary | ICD-10-CM | POA: Diagnosis not present

## 2020-03-24 DIAGNOSIS — Z9989 Dependence on other enabling machines and devices: Secondary | ICD-10-CM | POA: Diagnosis not present

## 2020-03-24 LAB — CBC WITH DIFFERENTIAL/PLATELET
Absolute Monocytes: 805 cells/uL (ref 200–950)
Basophils Absolute: 70 cells/uL (ref 0–200)
Basophils Relative: 1 %
Eosinophils Absolute: 217 cells/uL (ref 15–500)
Eosinophils Relative: 3.1 %
HCT: 41.9 % (ref 35.0–45.0)
Hemoglobin: 14.1 g/dL (ref 11.7–15.5)
Lymphs Abs: 2604 cells/uL (ref 850–3900)
MCH: 29.4 pg (ref 27.0–33.0)
MCHC: 33.7 g/dL (ref 32.0–36.0)
MCV: 87.5 fL (ref 80.0–100.0)
MPV: 9.3 fL (ref 7.5–12.5)
Monocytes Relative: 11.5 %
Neutro Abs: 3304 cells/uL (ref 1500–7800)
Neutrophils Relative %: 47.2 %
Platelets: 312 10*3/uL (ref 140–400)
RBC: 4.79 10*6/uL (ref 3.80–5.10)
RDW: 12 % (ref 11.0–15.0)
Total Lymphocyte: 37.2 %
WBC: 7 10*3/uL (ref 3.8–10.8)

## 2020-03-24 LAB — COMPLETE METABOLIC PANEL WITH GFR
AG Ratio: 1.5 (calc) (ref 1.0–2.5)
ALT: 22 U/L (ref 6–29)
AST: 24 U/L (ref 10–35)
Albumin: 4.4 g/dL (ref 3.6–5.1)
Alkaline phosphatase (APISO): 67 U/L (ref 37–153)
BUN/Creatinine Ratio: 36 (calc) — ABNORMAL HIGH (ref 6–22)
BUN: 26 mg/dL — ABNORMAL HIGH (ref 7–25)
CO2: 31 mmol/L (ref 20–32)
Calcium: 9.5 mg/dL (ref 8.6–10.4)
Chloride: 100 mmol/L (ref 98–110)
Creat: 0.72 mg/dL (ref 0.60–0.88)
GFR, Est African American: 91 mL/min/{1.73_m2} (ref >=60)
GFR, Est Non African American: 79 mL/min/{1.73_m2} (ref >=60)
Globulin: 2.9 g/dL (calc) (ref 1.9–3.7)
Glucose, Bld: 100 mg/dL — ABNORMAL HIGH (ref 65–99)
Potassium: 4.4 mmol/L (ref 3.5–5.3)
Sodium: 141 mmol/L (ref 135–146)
Total Bilirubin: 0.4 mg/dL (ref 0.2–1.2)
Total Protein: 7.3 g/dL (ref 6.1–8.1)

## 2020-03-24 LAB — URINALYSIS
Bilirubin Urine: NEGATIVE
Glucose, UA: NEGATIVE
Hgb urine dipstick: NEGATIVE
Ketones, ur: NEGATIVE
Nitrite: NEGATIVE
Specific Gravity, Urine: 1.022 (ref 1.001–1.03)
pH: 8 (ref 5.0–8.0)

## 2020-03-24 LAB — LIPID PANEL
Cholesterol: 134 mg/dL (ref <200)
HDL: 64 mg/dL (ref >=50)
LDL Cholesterol (Calc): 53 mg/dL (calc)
Non-HDL Cholesterol (Calc): 70 mg/dL (calc) (ref <130)
Total CHOL/HDL Ratio: 2.1 (calc) (ref <5.0)
Triglycerides: 91 mg/dL (ref <150)

## 2020-03-24 LAB — TSH: TSH: 1.45 mIU/L (ref 0.40–4.50)

## 2020-03-24 NOTE — Assessment & Plan Note (Signed)
Trial of AirFit F30 full facemask Continue CPAP of 12 cm   compliance with goal of at least 4-6 hrs every night is the expectation. Advised against medications with sedative side effects Cautioned against driving when sleepy - understanding that sleepiness will vary on a day to day basis

## 2020-03-24 NOTE — Progress Notes (Signed)
   Subjective:    Patient ID: Elizabeth Gray, female    DOB: Oct 23, 1940, 79 y.o.   MRN: [id number]  HPI  79 yo for FU of sarcoid & OSA -on cpap 12cmh20 she takes care of her 82 year old husband who has pulmonary hypertension & is on CPAP.   She had period Of insomnia in July with spontaneously resolved. CPAP mask makes mark over bridge of her nose.  She wears glasses. CPAP download was reviewed which shows sporadic compliance, average more than 4 hours on 12 cm, minimal residual events and minimal leak.  She had an episode of dizziness , reviewed blood work from 8/23, hemoglobin normal, normal renal function and electrolytes.  Blood pressure is okay, no history/to of orthostasis, no history of blood in stools, she is not on antihypertensives  CT coronary 12/2018 reviewed, normal lung fields She stopped taking Flovent and breathing has been okay  Significant tests/ events  PSG 06/2008 showed predominantly REM related obstructive sleep apnea , correctd by CPAP 12 cm.  CT 2008 Subtle upper lobe predominant perilymphatic nodularity with mildly prominent mediastinal and bihilar lymphoid tissue as well as gastrohepatic ligament lymph node. Question sarcoid. Nodular splenic enhancement pattern  05/2012 RAST neg  Review of Systems Patient denies significant dyspnea,cough, hemoptysis,  chest pain, palpitations, pedal edema, orthopnea, paroxysmal nocturnal dyspnea, lightheadedness, nausea, vomiting, abdominal or  leg pains      Objective:   Physical Exam  Gen. Pleasant, well-nourished, in no distress ENT - no thrush, no pallor/icterus,no post nasal drip Neck: No JVD, no thyromegaly, no carotid bruits Lungs: no use of accessory muscles, no dullness to percussion, clear without rales or rhonchi  Cardiovascular: Rhythm regular, heart sounds  normal, no murmurs or gallops, no peripheral edema Musculoskeletal: No deformities, no cyanosis or clubbing        Assessment & Plan:

## 2020-03-24 NOTE — Patient Instructions (Addendum)
  Antivert or dramamine OTC at bedtime for vertigo OK to stay off flovent Trial of airfit F30 full face mask

## 2020-03-24 NOTE — Assessment & Plan Note (Signed)
Okay to use Antivert or Dramamine OTC to prevent episodes in the morning

## 2020-03-24 NOTE — Assessment & Plan Note (Signed)
No evidence of residual sarcoid on CT from 01/2019 Okay to discontinue Flovent

## 2020-03-26 DIAGNOSIS — M5431 Sciatica, right side: Secondary | ICD-10-CM | POA: Diagnosis not present

## 2020-03-26 DIAGNOSIS — M9903 Segmental and somatic dysfunction of lumbar region: Secondary | ICD-10-CM | POA: Diagnosis not present

## 2020-03-26 DIAGNOSIS — M9904 Segmental and somatic dysfunction of sacral region: Secondary | ICD-10-CM | POA: Diagnosis not present

## 2020-03-26 DIAGNOSIS — M9905 Segmental and somatic dysfunction of pelvic region: Secondary | ICD-10-CM | POA: Diagnosis not present

## 2020-04-09 DIAGNOSIS — M5431 Sciatica, right side: Secondary | ICD-10-CM | POA: Diagnosis not present

## 2020-04-09 DIAGNOSIS — M9903 Segmental and somatic dysfunction of lumbar region: Secondary | ICD-10-CM | POA: Diagnosis not present

## 2020-04-09 DIAGNOSIS — M9905 Segmental and somatic dysfunction of pelvic region: Secondary | ICD-10-CM | POA: Diagnosis not present

## 2020-04-09 DIAGNOSIS — M9904 Segmental and somatic dysfunction of sacral region: Secondary | ICD-10-CM | POA: Diagnosis not present

## 2020-04-23 DIAGNOSIS — M9903 Segmental and somatic dysfunction of lumbar region: Secondary | ICD-10-CM | POA: Diagnosis not present

## 2020-04-23 DIAGNOSIS — M5431 Sciatica, right side: Secondary | ICD-10-CM | POA: Diagnosis not present

## 2020-04-23 DIAGNOSIS — M9905 Segmental and somatic dysfunction of pelvic region: Secondary | ICD-10-CM | POA: Diagnosis not present

## 2020-04-23 DIAGNOSIS — M9904 Segmental and somatic dysfunction of sacral region: Secondary | ICD-10-CM | POA: Diagnosis not present

## 2020-05-11 ENCOUNTER — Ambulatory Visit (INDEPENDENT_AMBULATORY_CARE_PROVIDER_SITE_OTHER): Payer: Medicare Other | Admitting: *Deleted

## 2020-05-11 ENCOUNTER — Other Ambulatory Visit: Payer: Self-pay

## 2020-05-11 DIAGNOSIS — Z23 Encounter for immunization: Secondary | ICD-10-CM | POA: Diagnosis not present

## 2020-05-14 DIAGNOSIS — M5431 Sciatica, right side: Secondary | ICD-10-CM | POA: Diagnosis not present

## 2020-05-14 DIAGNOSIS — M9905 Segmental and somatic dysfunction of pelvic region: Secondary | ICD-10-CM | POA: Diagnosis not present

## 2020-05-14 DIAGNOSIS — M9904 Segmental and somatic dysfunction of sacral region: Secondary | ICD-10-CM | POA: Diagnosis not present

## 2020-05-14 DIAGNOSIS — M9903 Segmental and somatic dysfunction of lumbar region: Secondary | ICD-10-CM | POA: Diagnosis not present

## 2020-05-23 ENCOUNTER — Other Ambulatory Visit: Payer: Self-pay | Admitting: Internal Medicine

## 2020-05-23 MED ORDER — GABAPENTIN 300 MG PO CAPS: 300.0000 mg | ORAL_CAPSULE | Freq: Every day | ORAL | 5 refills | Status: DC

## 2020-05-28 DIAGNOSIS — M5431 Sciatica, right side: Secondary | ICD-10-CM | POA: Diagnosis not present

## 2020-05-28 DIAGNOSIS — M9903 Segmental and somatic dysfunction of lumbar region: Secondary | ICD-10-CM | POA: Diagnosis not present

## 2020-05-28 DIAGNOSIS — M9904 Segmental and somatic dysfunction of sacral region: Secondary | ICD-10-CM | POA: Diagnosis not present

## 2020-05-28 DIAGNOSIS — M9905 Segmental and somatic dysfunction of pelvic region: Secondary | ICD-10-CM | POA: Diagnosis not present

## 2020-06-07 DIAGNOSIS — Z23 Encounter for immunization: Secondary | ICD-10-CM | POA: Diagnosis not present

## 2020-06-11 DIAGNOSIS — M9903 Segmental and somatic dysfunction of lumbar region: Secondary | ICD-10-CM | POA: Diagnosis not present

## 2020-06-11 DIAGNOSIS — M9904 Segmental and somatic dysfunction of sacral region: Secondary | ICD-10-CM | POA: Diagnosis not present

## 2020-06-11 DIAGNOSIS — M5431 Sciatica, right side: Secondary | ICD-10-CM | POA: Diagnosis not present

## 2020-06-11 DIAGNOSIS — M9905 Segmental and somatic dysfunction of pelvic region: Secondary | ICD-10-CM | POA: Diagnosis not present

## 2020-06-18 ENCOUNTER — Other Ambulatory Visit: Payer: Self-pay | Admitting: Internal Medicine

## 2020-06-26 DIAGNOSIS — Z20822 Contact with and (suspected) exposure to covid-19: Secondary | ICD-10-CM | POA: Diagnosis not present

## 2020-07-02 DIAGNOSIS — M5431 Sciatica, right side: Secondary | ICD-10-CM | POA: Diagnosis not present

## 2020-07-02 DIAGNOSIS — M9903 Segmental and somatic dysfunction of lumbar region: Secondary | ICD-10-CM | POA: Diagnosis not present

## 2020-07-02 DIAGNOSIS — M9904 Segmental and somatic dysfunction of sacral region: Secondary | ICD-10-CM | POA: Diagnosis not present

## 2020-07-02 DIAGNOSIS — M9905 Segmental and somatic dysfunction of pelvic region: Secondary | ICD-10-CM | POA: Diagnosis not present

## 2020-07-16 DIAGNOSIS — M9905 Segmental and somatic dysfunction of pelvic region: Secondary | ICD-10-CM | POA: Diagnosis not present

## 2020-07-16 DIAGNOSIS — M9903 Segmental and somatic dysfunction of lumbar region: Secondary | ICD-10-CM | POA: Diagnosis not present

## 2020-07-16 DIAGNOSIS — M9904 Segmental and somatic dysfunction of sacral region: Secondary | ICD-10-CM | POA: Diagnosis not present

## 2020-07-16 DIAGNOSIS — M5431 Sciatica, right side: Secondary | ICD-10-CM | POA: Diagnosis not present

## 2020-07-20 ENCOUNTER — Other Ambulatory Visit: Payer: Self-pay

## 2020-07-20 ENCOUNTER — Encounter: Payer: Self-pay | Admitting: Internal Medicine

## 2020-07-20 ENCOUNTER — Ambulatory Visit (INDEPENDENT_AMBULATORY_CARE_PROVIDER_SITE_OTHER): Payer: Medicare Other | Admitting: Internal Medicine

## 2020-07-20 DIAGNOSIS — T50905A Adverse effect of unspecified drugs, medicaments and biological substances, initial encounter: Secondary | ICD-10-CM

## 2020-07-20 DIAGNOSIS — E559 Vitamin D deficiency, unspecified: Secondary | ICD-10-CM | POA: Diagnosis not present

## 2020-07-20 DIAGNOSIS — G2581 Restless legs syndrome: Secondary | ICD-10-CM

## 2020-07-20 DIAGNOSIS — E538 Deficiency of other specified B group vitamins: Secondary | ICD-10-CM | POA: Diagnosis not present

## 2020-07-20 MED ORDER — DIAZEPAM 5 MG PO TABS: ORAL_TABLET | ORAL | 5 refills | Status: DC

## 2020-07-20 NOTE — Assessment & Plan Note (Signed)
On Vit B12 1000 mcg inj q 2-4 weeks

## 2020-07-20 NOTE — Assessment & Plan Note (Signed)
Gabapentin caused changes in hand coordination  Stop Gabapentin

## 2020-07-20 NOTE — Assessment & Plan Note (Signed)
On Vit D2000 iu/d

## 2020-07-20 NOTE — Assessment & Plan Note (Addendum)
Worse D/c Zolpidem. Start Diazepam  Potential benefits of a long term benzodiazepines  use as well as potential risks  and complications were explained to the patient and were aknowledged. Cont Tramadol Ref to Dr Leta Baptist if needed

## 2020-07-20 NOTE — Progress Notes (Signed)
Subjective:  Patient ID: [name], female    DOB: 1940-11-14  Age: 79 y.o. MRN: [id number]  CC: Medication Problem (Discuss Gabapentin.Marland Kitchen)   HPI MYLEA ROARTY presents for RLS - worse  Gabapentin caused side effects   Outpatient Medications Prior to Visit  Medication Sig Dispense Refill  . Alum Hydroxide-Mag Carbonate (GAVISCON PO) Take 1 tablet by mouth as needed (reflux). As directed as needed    . aspirin 81 MG tablet Take 81 mg by mouth every other day.     . Cholecalciferol (VITAMIN D PO) Take 1,000 Units by mouth every other day.     . cyanocobalamin (,VITAMIN B-12,) 1000 MCG/ML injection INJECT 1 ML INTRAMUSCULARLY EVERY 14 DAYS 10 mL 3  . EUTHYROX 100 MCG tablet Take 1 tablet by mouth once daily 90 tablet 2  . Flaxseed, Linseed, (FLAXSEED OIL) OIL Take 1 capsule by mouth daily.    . fluticasone (FLOVENT HFA) 110 MCG/ACT inhaler Inhale 2 puffs into the lungs 2 (two) times a day. 1 Inhaler 12  . GLUCOSAMINE-CHONDROITIN-MSM-D3 PO Take 1 tablet by mouth 2 (two) times daily.    . Multiple Vitamins-Minerals (ICAPS AREDS FORMULA PO) Take 1 capsule by mouth daily.    Marland Kitchen omeprazole-sodium bicarbonate (ZEGERID) 40-1100 MG capsule TAKE 1 CAPSULE BY MOUTH ONCE DAILY BEFORE BREAKFAST 90 capsule 0  . Polyethyl Glycol-Propyl Glycol 0.4-0.3 % SOLN Apply 1 drop to eye as needed (dry eyes).     . pravastatin (PRAVACHOL) 20 MG tablet Take 1 tablet by mouth once daily 90 tablet 3  . SYRINGE-NEEDLE, DISP, 3 ML (B-D 3CC LUER-LOK SYR 25GX1") 25G X 1" 3 ML MISC USE AS DIRECTED . APPOINTMENT REQUIRED FOR FUTURE REFILLS 8 each 11  . traMADol (ULTRAM) 50 MG tablet Take 2 tablets (100 mg total) by mouth at bedtime. 90 tablet 1  . vitamin C (ASCORBIC ACID) 500 MG tablet Take 500 mg by mouth daily.    Marland Kitchen zolpidem (AMBIEN) 10 MG tablet TAKE 1 TABLET BY MOUTH AT BEDTIME AS NEEDED UP TO FOR 30 DAYS FOR SLEEP 90 tablet 1  . gabapentin (NEURONTIN) 300 MG capsule Take 1 capsule (300 mg total) by mouth  at bedtime. (Patient not taking: Reported on 07/20/2020) 30 capsule 5   No facility-administered medications prior to visit.    ROS: Review of Systems  Objective:  There were no vitals taken for this visit.  BP Readings from Last 3 Encounters:  03/24/20 120/76  03/23/20 (!) 144/74  09/18/19 122/68    Wt Readings from Last 3 Encounters:  03/24/20 151 lb 9.6 oz (68.8 kg)  03/23/20 151 lb 6.4 oz (68.7 kg)  09/18/19 149 lb (67.6 kg)    Physical Exam  Lab Results  Component Value Date   WBC 7.0 03/23/2020   HGB 14.1 03/23/2020   HCT 41.9 03/23/2020   PLT 312 03/23/2020   GLUCOSE 100 (H) 03/23/2020   CHOL 134 03/23/2020   TRIG 91 03/23/2020   HDL 64 03/23/2020   LDLCALC 53 03/23/2020   ALT 22 03/23/2020   AST 24 03/23/2020   NA 141 03/23/2020   K 4.4 03/23/2020   CL 100 03/23/2020   CREATININE 0.72 03/23/2020   BUN 26 (H) 03/23/2020   CO2 31 03/23/2020   TSH 1.45 03/23/2020   INR 3.03 (H) 05/12/2014    CT CARDIAC SCORING  Addendum Date: 01/01/2019   ADDENDUM REPORT: 01/01/2019 17:04 CLINICAL DATA:  Risk stratification EXAM: Coronary Calcium Score TECHNIQUE: The patient was  scanned on a Siemens Somatom 64 slice scanner. Axial non-contrast 3 mm slices were carried out through the heart. The data set was analyzed on a dedicated work station and scored using the [place]. FINDINGS: Non-cardiac: See separate report from [facility]. Ascending aorta: Normal diameter 3.2 cm Pericardium: Normal Coronary arteries: Calcium noted in proximal RCA/Circumflex and proximal and mid LAD IMPRESSION: Coronary calcium score of 289. This was 39 th percentile for age and sex matched control. Jenkins Rouge Electronically Signed   By: Jenkins Rouge M.D.   On: 01/01/2019 17:04   Result Date: 01/01/2019 EXAM: OVER-READ INTERPRETATION  CT CHEST The following report is an over-read performed by radiologist Dr. Rolm Baptise of [facility], [place] on 01/01/2019. This over-read does not  include interpretation of cardiac or coronary anatomy or pathology. The coronary calcium score interpretation by the cardiologist is attached. COMPARISON:  01/03/2007 FINDINGS: Vascular: Heart is normal size. Aorta is normal caliber. Scattered aortic root calcifications. Mediastinum/Nodes: No adenopathy in the lower mediastinum or hila. Lungs/Pleura: Visualized lungs clear.  No effusions. Upper Abdomen: Imaging into the upper abdomen shows no acute findings. Musculoskeletal: Chest wall soft tissues are unremarkable. No acute bony abnormality. IMPRESSION: No acute extra cardiac abnormality. Scattered aortic root calcifications. Electronically Signed: By: Rolm Baptise M.D. On: 01/01/2019 10:55    Assessment & Plan:    Walker Kehr, MD

## 2020-07-30 DIAGNOSIS — M9903 Segmental and somatic dysfunction of lumbar region: Secondary | ICD-10-CM | POA: Diagnosis not present

## 2020-07-30 DIAGNOSIS — M9904 Segmental and somatic dysfunction of sacral region: Secondary | ICD-10-CM | POA: Diagnosis not present

## 2020-07-30 DIAGNOSIS — M9905 Segmental and somatic dysfunction of pelvic region: Secondary | ICD-10-CM | POA: Diagnosis not present

## 2020-07-30 DIAGNOSIS — M5431 Sciatica, right side: Secondary | ICD-10-CM | POA: Diagnosis not present

## 2020-08-24 ENCOUNTER — Other Ambulatory Visit: Payer: Self-pay | Admitting: Internal Medicine

## 2020-08-27 DIAGNOSIS — M9903 Segmental and somatic dysfunction of lumbar region: Secondary | ICD-10-CM | POA: Diagnosis not present

## 2020-08-27 DIAGNOSIS — M9905 Segmental and somatic dysfunction of pelvic region: Secondary | ICD-10-CM | POA: Diagnosis not present

## 2020-08-27 DIAGNOSIS — M9904 Segmental and somatic dysfunction of sacral region: Secondary | ICD-10-CM | POA: Diagnosis not present

## 2020-08-27 DIAGNOSIS — M5431 Sciatica, right side: Secondary | ICD-10-CM | POA: Diagnosis not present

## 2020-09-10 DIAGNOSIS — M9905 Segmental and somatic dysfunction of pelvic region: Secondary | ICD-10-CM | POA: Diagnosis not present

## 2020-09-10 DIAGNOSIS — M5431 Sciatica, right side: Secondary | ICD-10-CM | POA: Diagnosis not present

## 2020-09-10 DIAGNOSIS — M9904 Segmental and somatic dysfunction of sacral region: Secondary | ICD-10-CM | POA: Diagnosis not present

## 2020-09-10 DIAGNOSIS — M9903 Segmental and somatic dysfunction of lumbar region: Secondary | ICD-10-CM | POA: Diagnosis not present

## 2020-09-16 ENCOUNTER — Other Ambulatory Visit: Payer: Self-pay | Admitting: Internal Medicine

## 2020-09-18 ENCOUNTER — Telehealth: Payer: Self-pay | Admitting: Pulmonary Disease

## 2020-09-18 NOTE — Telephone Encounter (Signed)
PCC's have you guys received any paperwork from [facility] on this pt?  Thanks

## 2020-09-21 ENCOUNTER — Other Ambulatory Visit: Payer: Self-pay | Admitting: Internal Medicine

## 2020-09-21 NOTE — Telephone Encounter (Signed)
Elizabeth Gray we got it on 09/16/20 Dr Elsworth Soho is here today and can sign it

## 2020-09-21 NOTE — Telephone Encounter (Signed)
Noted. Nothing further needed. 

## 2020-09-23 ENCOUNTER — Other Ambulatory Visit: Payer: Self-pay

## 2020-09-23 ENCOUNTER — Encounter: Payer: Self-pay | Admitting: Internal Medicine

## 2020-09-23 ENCOUNTER — Ambulatory Visit (INDEPENDENT_AMBULATORY_CARE_PROVIDER_SITE_OTHER): Payer: Medicare Other | Admitting: Internal Medicine

## 2020-09-23 VITALS — BP 132/70 | HR 69 | Temp 98.2°F | Ht 63.5 in | Wt 155.2 lb

## 2020-09-23 DIAGNOSIS — E538 Deficiency of other specified B group vitamins: Secondary | ICD-10-CM

## 2020-09-23 DIAGNOSIS — E559 Vitamin D deficiency, unspecified: Secondary | ICD-10-CM | POA: Diagnosis not present

## 2020-09-23 DIAGNOSIS — F5101 Primary insomnia: Secondary | ICD-10-CM

## 2020-09-23 DIAGNOSIS — G2581 Restless legs syndrome: Secondary | ICD-10-CM | POA: Diagnosis not present

## 2020-09-23 DIAGNOSIS — E039 Hypothyroidism, unspecified: Secondary | ICD-10-CM

## 2020-09-23 DIAGNOSIS — G4700 Insomnia, unspecified: Secondary | ICD-10-CM | POA: Insufficient documentation

## 2020-09-23 DIAGNOSIS — E785 Hyperlipidemia, unspecified: Secondary | ICD-10-CM | POA: Diagnosis not present

## 2020-09-23 LAB — CBC WITH DIFFERENTIAL/PLATELET
Basophils Absolute: 0.1 10*3/uL (ref 0.0–0.1)
Basophils Relative: 1 % (ref 0.0–3.0)
Eosinophils Absolute: 0.3 10*3/uL (ref 0.0–0.7)
Eosinophils Relative: 3.9 % (ref 0.0–5.0)
HCT: 39.8 % (ref 36.0–46.0)
Hemoglobin: 13.5 g/dL (ref 12.0–15.0)
Lymphocytes Relative: 39.9 % (ref 12.0–46.0)
Lymphs Abs: 2.7 10*3/uL (ref 0.7–4.0)
MCHC: 33.9 g/dL (ref 30.0–36.0)
MCV: 85.1 fl (ref 78.0–100.0)
Monocytes Absolute: 0.9 10*3/uL (ref 0.1–1.0)
Monocytes Relative: 12.8 % — ABNORMAL HIGH (ref 3.0–12.0)
Neutro Abs: 2.9 10*3/uL (ref 1.4–7.7)
Neutrophils Relative %: 42.4 % — ABNORMAL LOW (ref 43.0–77.0)
Platelets: 319 10*3/uL (ref 150.0–400.0)
RBC: 4.68 Mil/uL (ref 3.87–5.11)
RDW: 13.7 % (ref 11.5–15.5)
WBC: 6.9 10*3/uL (ref 4.0–10.5)

## 2020-09-23 LAB — COMPREHENSIVE METABOLIC PANEL
ALT: 20 U/L (ref 0–35)
AST: 24 U/L (ref 0–37)
Albumin: 4.3 g/dL (ref 3.5–5.2)
Alkaline Phosphatase: 66 U/L (ref 39–117)
BUN: 27 mg/dL — ABNORMAL HIGH (ref 6–23)
CO2: 37 mEq/L — ABNORMAL HIGH (ref 19–32)
Calcium: 10 mg/dL (ref 8.4–10.5)
Chloride: 97 mEq/L (ref 96–112)
Creatinine, Ser: 0.92 mg/dL (ref 0.40–1.20)
GFR: 58.26 mL/min — ABNORMAL LOW (ref >60.00)
Glucose, Bld: 98 mg/dL (ref 70–99)
Potassium: 4.1 mEq/L (ref 3.5–5.1)
Sodium: 141 mEq/L (ref 135–145)
Total Bilirubin: 0.4 mg/dL (ref 0.2–1.2)
Total Protein: 7.3 g/dL (ref 6.0–8.3)

## 2020-09-23 LAB — URINALYSIS, ROUTINE W REFLEX MICROSCOPIC
Bilirubin Urine: NEGATIVE
Hgb urine dipstick: NEGATIVE
Ketones, ur: NEGATIVE
Nitrite: NEGATIVE
Specific Gravity, Urine: 1.015 (ref 1.000–1.030)
Urine Glucose: NEGATIVE
Urobilinogen, UA: 0.2 (ref 0.0–1.0)
pH: 8 (ref 5.0–8.0)

## 2020-09-23 LAB — LIPID PANEL
Cholesterol: 133 mg/dL (ref 0–200)
HDL: 56.9 mg/dL (ref >39.00)
LDL Cholesterol: 47 mg/dL (ref 0–99)
NonHDL: 75.64
Total CHOL/HDL Ratio: 2
Triglycerides: 141 mg/dL (ref 0.0–149.0)
VLDL: 28.2 mg/dL (ref 0.0–40.0)

## 2020-09-23 LAB — CK: Total CK: 54 U/L (ref 7–177)

## 2020-09-23 LAB — TSH: TSH: 0.69 u[IU]/mL (ref 0.35–4.50)

## 2020-09-23 LAB — T4, FREE: Free T4: 0.79 ng/dL (ref 0.60–1.60)

## 2020-09-23 MED ORDER — ZOLPIDEM TARTRATE 10 MG PO TABS: 10.0000 mg | ORAL_TABLET | Freq: Every evening | ORAL | 1 refills | Status: DC | PRN

## 2020-09-23 MED ORDER — PRAMIPEXOLE DIHYDROCHLORIDE 0.125 MG PO TABS: 0.1250 mg | ORAL_TABLET | Freq: Three times a day (TID) | ORAL | 5 refills | Status: DC

## 2020-09-23 NOTE — Progress Notes (Signed)
Subjective:  Patient ID: Elizabeth Gray, female    DOB: [birth year]  Age: 80 y.o. MRN: [id number]  CC: Follow-up (6 month f/u)   HPI Elizabeth Gray presents for RLS - Diazepam had side effects - drowsy the next day Cousin takes Mirapex and he is doing well  Outpatient Medications Prior to Visit  Medication Sig Dispense Refill  . Alum Hydroxide-Mag Carbonate (GAVISCON PO) Take 1 tablet by mouth as needed (reflux). As directed as needed    . aspirin 81 MG tablet Take 81 mg by mouth every other day.     . Cholecalciferol (VITAMIN D PO) Take 1,000 Units by mouth every other day.     . cyanocobalamin (,VITAMIN B-12,) 1000 MCG/ML injection INJECT 1 ML INTRAMUSCULARLY EVERY 14 DAYS 10 mL 3  . diazepam (VALIUM) 5 MG tablet Take 5-10 mg at hs for restless legs/insomnia 60 tablet 5  . EUTHYROX 100 MCG tablet Take 1 tablet by mouth once daily 90 tablet 3  . Flaxseed, Linseed, (FLAXSEED OIL) OIL Take 1 capsule by mouth daily.    . fluticasone (FLOVENT HFA) 110 MCG/ACT inhaler Inhale 2 puffs into the lungs 2 (two) times a day. 1 Inhaler 12  . GLUCOSAMINE-CHONDROITIN-MSM-D3 PO Take 1 tablet by mouth 2 (two) times daily.    . Multiple Vitamins-Minerals (ICAPS AREDS FORMULA PO) Take 1 capsule by mouth daily.    Marland Kitchen omeprazole-sodium bicarbonate (ZEGERID) 40-1100 MG capsule TAKE 1 CAPSULE BY MOUTH ONCE DAILY BEFORE BREAKFAST 90 capsule 0  . Polyethyl Glycol-Propyl Glycol 0.4-0.3 % SOLN Apply 1 drop to eye as needed (dry eyes).     . pravastatin (PRAVACHOL) 20 MG tablet Take 1 tablet by mouth once daily 90 tablet 3  . SYRINGE-NEEDLE, DISP, 3 ML (B-D 3CC LUER-LOK SYR 25GX1") 25G X 1" 3 ML MISC USE AS DIRECTED . APPOINTMENT REQUIRED FOR FUTURE REFILLS 8 each 11  . traMADol (ULTRAM) 50 MG tablet Take 2 tablets (100 mg total) by mouth at bedtime. 90 tablet 1  . vitamin C (ASCORBIC ACID) 500 MG tablet Take 500 mg by mouth daily.     No facility-administered medications prior to visit.    ROS: Review  of Systems  Constitutional: Negative for activity change, appetite change, chills, fatigue and unexpected weight change.  HENT: Negative for congestion, mouth sores and sinus pressure.   Eyes: Negative for visual disturbance.  Respiratory: Negative for cough and chest tightness.   Gastrointestinal: Negative for abdominal pain and nausea.  Genitourinary: Negative for difficulty urinating, frequency and vaginal pain.  Musculoskeletal: Positive for arthralgias. Negative for back pain and gait problem.  Skin: Negative for pallor and rash.  Neurological: Negative for dizziness, tremors, weakness, numbness and headaches.  Psychiatric/Behavioral: Positive for sleep disturbance. Negative for confusion.    Objective:  BP 132/70 (BP Location: Left Arm)   Pulse 69   Temp 98.2 F (36.8 C) (Oral)   Ht 5' 3.5" (1.613 m)   Wt 155 lb 3.2 oz (70.4 kg)   SpO2 94%   BMI 27.06 kg/m   BP Readings from Last 3 Encounters:  09/23/20 132/70  03/24/20 120/76  03/23/20 (!) 144/74    Wt Readings from Last 3 Encounters:  09/23/20 155 lb 3.2 oz (70.4 kg)  03/24/20 151 lb 9.6 oz (68.8 kg)  03/23/20 151 lb 6.4 oz (68.7 kg)    Physical Exam Constitutional:      General: She is not in acute distress.    Appearance: She is well-developed.  HENT:  Head: Normocephalic.     Right Ear: External ear normal.     Left Ear: External ear normal.     Nose: Nose normal.     Mouth/Throat:     Mouth: Oropharynx is clear and moist.  Eyes:     General:        Right eye: No discharge.        Left eye: No discharge.     Conjunctiva/sclera: Conjunctivae normal.     Pupils: Pupils are equal, round, and reactive to light.  Neck:     Thyroid: No thyromegaly.     Vascular: No JVD.     Trachea: No tracheal deviation.  Cardiovascular:     Rate and Rhythm: Normal rate and regular rhythm.     Heart sounds: Normal heart sounds.  Pulmonary:     Effort: No respiratory distress.     Breath sounds: No stridor. No  wheezing.  Abdominal:     General: Bowel sounds are normal. There is no distension.     Palpations: Abdomen is soft. There is no mass.     Tenderness: There is no abdominal tenderness. There is no guarding or rebound.  Musculoskeletal:        General: No tenderness or edema.     Cervical back: Normal range of motion and neck supple.  Lymphadenopathy:     Cervical: No cervical adenopathy.  Skin:    Findings: No erythema or rash.  Neurological:     Cranial Nerves: No cranial nerve deficit.     Motor: No abnormal muscle tone.     Coordination: Coordination normal.     Deep Tendon Reflexes: Reflexes normal.  Psychiatric:        Mood and Affect: Mood and affect normal.        Behavior: Behavior normal.        Thought Content: Thought content normal.        Judgment: Judgment normal.     Lab Results  Component Value Date   WBC 7.0 03/23/2020   HGB 14.1 03/23/2020   HCT 41.9 03/23/2020   PLT 312 03/23/2020   GLUCOSE 100 (H) 03/23/2020   CHOL 134 03/23/2020   TRIG 91 03/23/2020   HDL 64 03/23/2020   LDLCALC 53 03/23/2020   ALT 22 03/23/2020   AST 24 03/23/2020   NA 141 03/23/2020   K 4.4 03/23/2020   CL 100 03/23/2020   CREATININE 0.72 03/23/2020   BUN 26 (H) 03/23/2020   CO2 31 03/23/2020   TSH 1.45 03/23/2020   INR 3.03 (H) 05/12/2014    CT CARDIAC SCORING  Addendum Date: 01/01/2019   ADDENDUM REPORT: 01/01/2019 17:04 CLINICAL DATA:  Risk stratification EXAM: Coronary Calcium Score TECHNIQUE: The patient was scanned on a Siemens Somatom 64 slice scanner. Axial non-contrast 3 mm slices were carried out through the heart. The data set was analyzed on a dedicated work station and scored using the [place]. FINDINGS: Non-cardiac: See separate report from [facility]. Ascending aorta: Normal diameter 3.2 cm Pericardium: Normal Coronary arteries: Calcium noted in proximal RCA/Circumflex and proximal and mid LAD IMPRESSION: Coronary calcium score of 289. This was 8  th percentile for age and sex matched control. Jenkins Rouge Electronically Signed   By: Jenkins Rouge M.D.   On: 01/01/2019 17:04   Result Date: 01/01/2019 EXAM: OVER-READ INTERPRETATION  CT CHEST The following report is an over-read performed by radiologist Dr. Rolm Baptise of [facility], [place] on 01/01/2019. This over-read does  not include interpretation of cardiac or coronary anatomy or pathology. The coronary calcium score interpretation by the cardiologist is attached. COMPARISON:  01/03/2007 FINDINGS: Vascular: Heart is normal size. Aorta is normal caliber. Scattered aortic root calcifications. Mediastinum/Nodes: No adenopathy in the lower mediastinum or hila. Lungs/Pleura: Visualized lungs clear.  No effusions. Upper Abdomen: Imaging into the upper abdomen shows no acute findings. Musculoskeletal: Chest wall soft tissues are unremarkable. No acute bony abnormality. IMPRESSION: No acute extra cardiac abnormality. Scattered aortic root calcifications. Electronically Signed: By: Rolm Baptise M.D. On: 01/01/2019 10:55    Assessment & Plan:   There are no diagnoses linked to this encounter.   No orders of the defined types were placed in this encounter.    Follow-up: No follow-ups on file.  Walker Kehr, MD

## 2020-09-23 NOTE — Addendum Note (Signed)
Addended by: Raliegh Ip on: 09/23/2020 08:35 AM   Modules accepted: Orders

## 2020-09-23 NOTE — Assessment & Plan Note (Signed)
Pravastatin po CK due to myalgias

## 2020-09-23 NOTE — Assessment & Plan Note (Signed)
On Vit D 

## 2020-09-23 NOTE — Assessment & Plan Note (Signed)
On B12 

## 2020-09-23 NOTE — Assessment & Plan Note (Signed)
RLS - Diazepam had side effects - drowsy the next day Cousin takes Mirapex and he is doing well - will try

## 2020-09-24 DIAGNOSIS — M9903 Segmental and somatic dysfunction of lumbar region: Secondary | ICD-10-CM | POA: Diagnosis not present

## 2020-09-24 DIAGNOSIS — M9904 Segmental and somatic dysfunction of sacral region: Secondary | ICD-10-CM | POA: Diagnosis not present

## 2020-09-24 DIAGNOSIS — M5431 Sciatica, right side: Secondary | ICD-10-CM | POA: Diagnosis not present

## 2020-09-24 DIAGNOSIS — M9905 Segmental and somatic dysfunction of pelvic region: Secondary | ICD-10-CM | POA: Diagnosis not present

## 2020-09-25 ENCOUNTER — Other Ambulatory Visit: Payer: Self-pay | Admitting: Internal Medicine

## 2020-09-25 ENCOUNTER — Telehealth: Payer: Self-pay | Admitting: Internal Medicine

## 2020-09-25 NOTE — Telephone Encounter (Signed)
Left Pat a message that her medicine has been sent in as requested.

## 2020-09-25 NOTE — Telephone Encounter (Signed)
Inbound call from patient requesting a refill for omeprazole-sodium bicarbonate medication please.  She has an appointment scheduled for 10/02/2020 but is out of medication.

## 2020-10-02 ENCOUNTER — Ambulatory Visit (INDEPENDENT_AMBULATORY_CARE_PROVIDER_SITE_OTHER): Payer: Medicare Other | Admitting: Physician Assistant

## 2020-10-02 ENCOUNTER — Encounter: Payer: Self-pay | Admitting: Physician Assistant

## 2020-10-02 VITALS — BP 118/60 | HR 79 | Ht 63.5 in | Wt 157.0 lb

## 2020-10-02 DIAGNOSIS — R1310 Dysphagia, unspecified: Secondary | ICD-10-CM | POA: Diagnosis not present

## 2020-10-02 DIAGNOSIS — K219 Gastro-esophageal reflux disease without esophagitis: Secondary | ICD-10-CM | POA: Diagnosis not present

## 2020-10-02 MED ORDER — OMEPRAZOLE-SODIUM BICARBONATE 40-1100 MG PO CAPS: ORAL_CAPSULE | ORAL | 0 refills | Status: DC

## 2020-10-02 MED ORDER — OMEPRAZOLE-SODIUM BICARBONATE 40-1100 MG PO CAPS: ORAL_CAPSULE | ORAL | 4 refills | Status: DC

## 2020-10-02 NOTE — Progress Notes (Signed)
Subjective:    Patient ID: Elizabeth Gray, female    DOB: Dec 29, 1940, 80 y.o.   MRN: [id number]  HPI Elizabeth Gray is a pleasant 80 year old female, established with Dr. Carlean Purl who comes in today for medication refill.  She has history of coronary artery disease, hypothyroidism, restless leg syndrome, prior history of adenomatous colon polyps and history of chronic GERD. She is maintained on Zegerid 40/1101 p.o. every morning and says this works better than any other PPIs she has tried.  Despite taking this on a regular basis she has occasional indigestion.  She also has recently redeveloped a sense of food "sitting" in the esophagus and a sensation that that food is slow to traverse the esophagus.  She also describes a fullness in her throat intermittently and intermittent burning type discomfort.  She had an episode recently with eating almonds where they became lodged.  She says this was uncomfortable, she did not vomit or regurgitate and eventually within a couple of hours the sensation subsided. Last colonoscopy August 2019 with removal of a 3 mm polyp in the ascending colon which is a tubular adenoma, no follow-up due to age and EGD in October 2019 with finding of a mildly tortuous distal esophagus, she was TTS dilated to 20 and was also noted to have patchy gastritis.  Patient says that she did have good response from the dilation and had not had any difficulty with dysphagia type symptoms until the past few months. She does not feel that she needs to have another endoscopy as yet.  Review of Systems Pertinent positive and negative review of systems were noted in the above HPI section.  All other review of systems was otherwise negative.  Outpatient Encounter Medications as of 10/02/2020  Medication Sig  . Alum Hydroxide-Mag Carbonate (GAVISCON PO) Take 1 tablet by mouth as needed (reflux). As directed as needed  . aspirin 81 MG tablet Take 81 mg by mouth every other day.   . Cholecalciferol  (VITAMIN D PO) Take 1,000 Units by mouth every other day.   . cyanocobalamin (,VITAMIN B-12,) 1000 MCG/ML injection INJECT 1 ML INTRAMUSCULARLY EVERY 14 DAYS  . EUTHYROX 100 MCG tablet Take 1 tablet by mouth once daily  . Flaxseed, Linseed, (FLAXSEED OIL) OIL Take 1 capsule by mouth daily.  . fluticasone (FLOVENT HFA) 110 MCG/ACT inhaler Inhale 2 puffs into the lungs 2 (two) times a day.  Marland Kitchen GLUCOSAMINE-CHONDROITIN-MSM-D3 PO Take 1 tablet by mouth 2 (two) times daily.  . Multiple Vitamins-Minerals (ICAPS AREDS FORMULA PO) Take 1 capsule by mouth daily.  Vladimir Faster Glycol-Propyl Glycol 0.4-0.3 % SOLN Apply 1 drop to eye as needed (dry eyes).   . pramipexole (MIRAPEX) 0.125 MG tablet Take 1-2 tablets (0.125-0.25 mg total) by mouth 3 (three) times daily.  . pravastatin (PRAVACHOL) 20 MG tablet Take 1 tablet by mouth once daily  . SYRINGE-NEEDLE, DISP, 3 ML (B-D 3CC LUER-LOK SYR 25GX1") 25G X 1" 3 ML MISC USE AS DIRECTED . APPOINTMENT REQUIRED FOR FUTURE REFILLS  . traMADol (ULTRAM) 50 MG tablet Take 2 tablets (100 mg total) by mouth at bedtime.  . vitamin C (ASCORBIC ACID) 500 MG tablet Take 500 mg by mouth daily.  Marland Kitchen zolpidem (AMBIEN) 10 MG tablet Take 1 tablet (10 mg total) by mouth at bedtime as needed for sleep.  . [DISCONTINUED] omeprazole-sodium bicarbonate (ZEGERID) 40-1100 MG capsule TAKE 1 CAPSULE BY MOUTH ONCE DAILY BEFORE BREAKFAST . APPOINTMENT REQUIRED FOR FUTURE REFILLS  . omeprazole-sodium bicarbonate (ZEGERID) 40-1100 MG  capsule TAKE 1 CAPSULE BY MOUTH ONCE DAILY BEFORE BREAKFAST  . [DISCONTINUED] omeprazole-sodium bicarbonate (ZEGERID) 40-1100 MG capsule TAKE 1 CAPSULE BY MOUTH ONCE DAILY BEFORE BREAKFAST . APPOINTMENT REQUIRED FOR FUTURE REFILLS   No facility-administered encounter medications on file as of 10/02/2020.   Allergies  Allergen Reactions  . Dexilant [Dexlansoprazole]     "set my stomach on fire and made me SOB"  . Gabapentin     "Music apraxia"  . Metoclopramide Hcl  Other (See Comments)    REGLAN="burning in mouth"  . Oxymetazoline Hcl Itching    AFRIN SPRAY  . Restasis [Cyclosporine]     "can't use it"  . Sucralfate Other (See Comments)    CARAFATE=Mouth sores   Patient Active Problem List   Diagnosis Date Noted  . Insomnia 09/23/2020  . Dyslipidemia 09/23/2020  . RLS (restless legs syndrome) 07/20/2020  . Drug side effects, initial encounter 07/20/2020  . Coronary atherosclerosis 03/18/2019  . Post-traumatic headache, not intractable 12/04/2018  . Grief 09/12/2018  . Atrophic vaginitis 09/06/2017  . Burning sensation of mouth 09/06/2017  . Osteopenia 09/05/2016  . Chest pain 03/04/2016  . Degenerative arthritis of right knee 05/09/2014  . Headache(784.0) 07/19/2013  . Tinnitus 07/19/2013  . Dry skin dermatitis 08/31/2012  . Left hip pain 08/31/2012  . Cerumen impaction 08/31/2012  . Well adult exam 05/25/2012  . Upper abdominal pain-chronic 04/02/2011  . Thumb pain 03/11/2011  . Donor, blood 03/11/2011  . Shoulder pain 03/11/2011  . OSTEOARTHRITIS 09/10/2010  . VERTIGO 03/05/2010  . OSA on CPAP 07/22/2008  . B12 deficiency 08/16/2007  . Vitamin D deficiency 08/16/2007  . 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 SARCOIDOSIS, PULMONARY 05/09/2007  . Hypothyroidism 05/09/2007  . ALLERGIC RHINITIS 05/09/2007  . GERD 05/09/2007  . Personal history of colon polyps 05/09/2007   Social History   Socioeconomic History  . Marital status: Widowed    Spouse name: [name]  . Number of children: 0  . Years of education: college  . Highest education level: Not on file  Occupational History  . Occupation: Statistician    Comment: works three days per week   . Occupation: LEGAL ASSISTANT 3d/wk    Employer: [employer]  Tobacco Use  . Smoking status: Former Smoker    Packs/day: 1.00    Years: 17.00    Pack years: 17.00    Types: Cigarettes    Quit date: 08/01/1990    Years since quitting: 30.1  . Smokeless tobacco: Never Used   Vaping Use  . Vaping Use: Never used  Substance and Sexual Activity  . Alcohol use: No    Alcohol/week: 0.0 standard drinks  . Drug use: No  . Sexual activity: Never  Other Topics Concern  . Not on file  Social History Narrative   Patient lives at home with her spouse.   Caffeine Use: none   She just retired on August 10, 2017- worked in a [employer] firm for 59 years.   Right handed   Social Determinants of Health   Financial Resource Strain: Not on file  Food Insecurity: Not on file  Transportation Needs: Not on file  Physical Activity: Not on file  Stress: Not on file  Social Connections: Not on file  Intimate Partner Violence: Not on file    Ms. Canipe's family history includes Brain cancer in her father; Breast cancer in her paternal aunt; Colon cancer in her maternal aunt; Esophageal cancer in her maternal grandfather; Migraines in her mother.  Objective:    Vitals:   10/02/20 1039  BP: 118/60  Pulse: 79    Physical Exam Well-developed well-nourished   in no acute distress.  Height, Weight 157 , BMI 27.3  HEENT; nontraumatic normocephalic, EOMI, PE R LA, sclera anicteric. Oropharynx;NOT DONE  Neuro/Psych; alert and oriented x4, grossly nonfocal mood and affect appropriate       Assessment & Plan:   #78 80 year old female with history of chronic GERD, who is also felt to have a component of esophageal dysmotility and has required prior dilation for symptoms of dysphagia without definite stricture but noted to have mildly tortuous  distal esophagus, and has had good response to previous balloon dilation.  Patient generally has good control of GERD symptoms with Zegerid.  She has developed some mild intermittent dysphagia over the past few months and one episode of what sounds like transient impaction after eating almonds.  #2 history of adenomatous colon polyps-last colonoscopy August 2019 with removal of 1 diminutive adenomatous polyp, no follow-up  recommended due to age 57.  Coronary artery disease 4.  Hypothyroidism 5.  Restless leg syndrome  Plan; refill Zegerid 40/1100 mg 1 p.o. every morning x1 year Continue antireflux precautions. We discussed careful chewing of food, eating slowly and sipping fluids between bites We discussed repeat EGD with dilation.  She wants to monitor her symptoms over the next few months and will call if she has any progression of symptoms and at that time can be set up for EGD with dilation with Dr. Carlean Purl.  Wilfred Siverson S Jushua Waltman PA-C 10/02/2020   Cc: Plotnikov, Evie Lacks, MD

## 2020-10-02 NOTE — Patient Instructions (Addendum)
If you are age 82 or older, your body mass index should be between 23-30. Your Body mass index is 27.38 kg/m. If this is out of the aforementioned range listed, please consider follow up with your Primary Care Provider.   We have sent the following medications to your pharmacy for you to pick up at your convenience: Zegerid 40 mg  Call back to schedule an endoscopy if your symptoms worsen.  Thank you for choosing me and [facility].  Nicoletta Ba, PA

## 2020-10-08 DIAGNOSIS — M9905 Segmental and somatic dysfunction of pelvic region: Secondary | ICD-10-CM | POA: Diagnosis not present

## 2020-10-08 DIAGNOSIS — M9903 Segmental and somatic dysfunction of lumbar region: Secondary | ICD-10-CM | POA: Diagnosis not present

## 2020-10-08 DIAGNOSIS — M5431 Sciatica, right side: Secondary | ICD-10-CM | POA: Diagnosis not present

## 2020-10-08 DIAGNOSIS — M9904 Segmental and somatic dysfunction of sacral region: Secondary | ICD-10-CM | POA: Diagnosis not present

## 2020-10-22 DIAGNOSIS — M5431 Sciatica, right side: Secondary | ICD-10-CM | POA: Diagnosis not present

## 2020-10-22 DIAGNOSIS — M9904 Segmental and somatic dysfunction of sacral region: Secondary | ICD-10-CM | POA: Diagnosis not present

## 2020-10-22 DIAGNOSIS — M9903 Segmental and somatic dysfunction of lumbar region: Secondary | ICD-10-CM | POA: Diagnosis not present

## 2020-10-22 DIAGNOSIS — M9905 Segmental and somatic dysfunction of pelvic region: Secondary | ICD-10-CM | POA: Diagnosis not present

## 2020-11-10 DIAGNOSIS — M9905 Segmental and somatic dysfunction of pelvic region: Secondary | ICD-10-CM | POA: Diagnosis not present

## 2020-11-10 DIAGNOSIS — M9904 Segmental and somatic dysfunction of sacral region: Secondary | ICD-10-CM | POA: Diagnosis not present

## 2020-11-10 DIAGNOSIS — M9903 Segmental and somatic dysfunction of lumbar region: Secondary | ICD-10-CM | POA: Diagnosis not present

## 2020-11-10 DIAGNOSIS — M5431 Sciatica, right side: Secondary | ICD-10-CM | POA: Diagnosis not present

## 2020-11-20 ENCOUNTER — Other Ambulatory Visit: Payer: Self-pay

## 2020-11-20 DIAGNOSIS — Z1231 Encounter for screening mammogram for malignant neoplasm of breast: Secondary | ICD-10-CM | POA: Diagnosis not present

## 2020-11-23 ENCOUNTER — Other Ambulatory Visit: Payer: Self-pay

## 2020-11-23 ENCOUNTER — Ambulatory Visit (INDEPENDENT_AMBULATORY_CARE_PROVIDER_SITE_OTHER): Payer: Medicare Other | Admitting: Internal Medicine

## 2020-11-23 ENCOUNTER — Encounter: Payer: Self-pay | Admitting: Internal Medicine

## 2020-11-23 DIAGNOSIS — G2581 Restless legs syndrome: Secondary | ICD-10-CM

## 2020-11-23 DIAGNOSIS — E559 Vitamin D deficiency, unspecified: Secondary | ICD-10-CM | POA: Diagnosis not present

## 2020-11-23 DIAGNOSIS — E538 Deficiency of other specified B group vitamins: Secondary | ICD-10-CM | POA: Diagnosis not present

## 2020-11-23 NOTE — Assessment & Plan Note (Signed)
On Vit D 

## 2020-11-23 NOTE — Assessment & Plan Note (Signed)
On B12 

## 2020-11-23 NOTE — Progress Notes (Signed)
Subjective:  Patient ID: Elizabeth Gray, female    DOB: 10-18-1940  Age: 80 y.o. MRN: [id number]  CC: Follow-up (4 week f/u)   HPI KEWANA SANON presents for RLS - on Mirapex now, better F/u B12 def  Outpatient Medications Prior to Visit  Medication Sig Dispense Refill  . Alum Hydroxide-Mag Carbonate (GAVISCON PO) Take 1 tablet by mouth as needed (reflux). As directed as needed    . aspirin 81 MG tablet Take 81 mg by mouth every other day.     . Cholecalciferol (VITAMIN D PO) Take 1,000 Units by mouth every other day.     . cyanocobalamin (,VITAMIN B-12,) 1000 MCG/ML injection INJECT 1 ML INTRAMUSCULARLY EVERY 14 DAYS 10 mL 3  . EUTHYROX 100 MCG tablet Take 1 tablet by mouth once daily 90 tablet 3  . Flaxseed, Linseed, (FLAXSEED OIL) OIL Take 1 capsule by mouth daily.    . fluticasone (FLOVENT HFA) 110 MCG/ACT inhaler Inhale 2 puffs into the lungs 2 (two) times a day. 1 Inhaler 12  . GLUCOSAMINE-CHONDROITIN-MSM-D3 PO Take 1 tablet by mouth 2 (two) times daily.    . Multiple Vitamins-Minerals (ICAPS AREDS FORMULA PO) Take 1 capsule by mouth daily.    Marland Kitchen omeprazole-sodium bicarbonate (ZEGERID) 40-1100 MG capsule TAKE 1 CAPSULE BY MOUTH ONCE DAILY BEFORE BREAKFAST 90 capsule 4  . Polyethyl Glycol-Propyl Glycol 0.4-0.3 % SOLN Apply 1 drop to eye as needed (dry eyes).     . pramipexole (MIRAPEX) 0.125 MG tablet Take 1-2 tablets (0.125-0.25 mg total) by mouth 3 (three) times daily. 60 tablet 5  . pravastatin (PRAVACHOL) 20 MG tablet Take 1 tablet by mouth once daily 90 tablet 3  . SYRINGE-NEEDLE, DISP, 3 ML (B-D 3CC LUER-LOK SYR 25GX1") 25G X 1" 3 ML MISC USE AS DIRECTED . APPOINTMENT REQUIRED FOR FUTURE REFILLS 8 each 11  . traMADol (ULTRAM) 50 MG tablet Take 2 tablets (100 mg total) by mouth at bedtime. 90 tablet 1  . vitamin C (ASCORBIC ACID) 500 MG tablet Take 500 mg by mouth daily.    Marland Kitchen zolpidem (AMBIEN) 10 MG tablet Take 1 tablet (10 mg total) by mouth at bedtime as needed for  sleep. 90 tablet 1   No facility-administered medications prior to visit.    ROS: Review of Systems  Constitutional: Negative for activity change, appetite change, chills, fatigue and unexpected weight change.  HENT: Negative for congestion, mouth sores and sinus pressure.   Eyes: Negative for visual disturbance.  Respiratory: Negative for cough and chest tightness.   Gastrointestinal: Negative for abdominal pain and nausea.  Genitourinary: Negative for difficulty urinating, frequency and vaginal pain.  Musculoskeletal: Negative for back pain and gait problem.  Skin: Negative for pallor and rash.  Neurological: Negative for dizziness, tremors, weakness, numbness and headaches.  Psychiatric/Behavioral: Negative for confusion and sleep disturbance.    Objective:  BP 130/70 (BP Location: Left Arm)   Pulse 85   Temp 99.6 F (37.6 C) (Oral) Comment: pt had a breath saver (mint)  Ht 5' 3.5" (1.613 m)   Wt 151 lb 9.6 oz (68.8 kg)   SpO2 95%   BMI 26.43 kg/m   BP Readings from Last 3 Encounters:  11/23/20 130/70  10/02/20 118/60  09/23/20 132/70    Wt Readings from Last 3 Encounters:  11/23/20 151 lb 9.6 oz (68.8 kg)  10/02/20 157 lb (71.2 kg)  09/23/20 155 lb 3.2 oz (70.4 kg)    Physical Exam Constitutional:  General: She is not in acute distress.    Appearance: She is well-developed.  HENT:     Head: Normocephalic.     Right Ear: External ear normal.     Left Ear: External ear normal.     Nose: Nose normal.  Eyes:     General:        Right eye: No discharge.        Left eye: No discharge.     Conjunctiva/sclera: Conjunctivae normal.     Pupils: Pupils are equal, round, and reactive to light.  Neck:     Thyroid: No thyromegaly.     Vascular: No JVD.     Trachea: No tracheal deviation.  Cardiovascular:     Rate and Rhythm: Normal rate and regular rhythm.     Heart sounds: Normal heart sounds.  Pulmonary:     Effort: No respiratory distress.     Breath  sounds: No stridor. No wheezing.  Abdominal:     General: Bowel sounds are normal. There is no distension.     Palpations: Abdomen is soft. There is no mass.     Tenderness: There is no abdominal tenderness. There is no guarding or rebound.  Musculoskeletal:        General: No tenderness.     Cervical back: Normal range of motion and neck supple.  Lymphadenopathy:     Cervical: No cervical adenopathy.  Skin:    Findings: No erythema or rash.  Neurological:     Mental Status: She is oriented to person, place, and time.     Cranial Nerves: No cranial nerve deficit.     Motor: No abnormal muscle tone.     Coordination: Coordination normal.     Deep Tendon Reflexes: Reflexes normal.  Psychiatric:        Behavior: Behavior normal.        Thought Content: Thought content normal.        Judgment: Judgment normal.     Lab Results  Component Value Date   WBC 6.9 09/23/2020   HGB 13.5 09/23/2020   HCT 39.8 09/23/2020   PLT 319.0 09/23/2020   GLUCOSE 98 09/23/2020   CHOL 133 09/23/2020   TRIG 141.0 09/23/2020   HDL 56.90 09/23/2020   LDLCALC 47 09/23/2020   ALT 20 09/23/2020   AST 24 09/23/2020   NA 141 09/23/2020   K 4.1 09/23/2020   CL 97 09/23/2020   CREATININE 0.92 09/23/2020   BUN 27 (H) 09/23/2020   CO2 37 (H) 09/23/2020   TSH 0.69 09/23/2020   INR 3.03 (H) 05/12/2014    CT CARDIAC SCORING  Addendum Date: 01/01/2019   ADDENDUM REPORT: 01/01/2019 17:04 CLINICAL DATA:  Risk stratification EXAM: Coronary Calcium Score TECHNIQUE: The patient was scanned on a Siemens Somatom 64 slice scanner. Axial non-contrast 3 mm slices were carried out through the heart. The data set was analyzed on a dedicated work station and scored using the [place]. FINDINGS: Non-cardiac: See separate report from [facility]. Ascending aorta: Normal diameter 3.2 cm Pericardium: Normal Coronary arteries: Calcium noted in proximal RCA/Circumflex and proximal and mid LAD IMPRESSION:  Coronary calcium score of 289. This was 60 th percentile for age and sex matched control. Jenkins Rouge Electronically Signed   By: Jenkins Rouge M.D.   On: 01/01/2019 17:04   Result Date: 01/01/2019 EXAM: OVER-READ INTERPRETATION  CT CHEST The following report is an over-read performed by radiologist Dr. Rolm Baptise of [facility], [place] on 01/01/2019.  This over-read does not include interpretation of cardiac or coronary anatomy or pathology. The coronary calcium score interpretation by the cardiologist is attached. COMPARISON:  01/03/2007 FINDINGS: Vascular: Heart is normal size. Aorta is normal caliber. Scattered aortic root calcifications. Mediastinum/Nodes: No adenopathy in the lower mediastinum or hila. Lungs/Pleura: Visualized lungs clear.  No effusions. Upper Abdomen: Imaging into the upper abdomen shows no acute findings. Musculoskeletal: Chest wall soft tissues are unremarkable. No acute bony abnormality. IMPRESSION: No acute extra cardiac abnormality. Scattered aortic root calcifications. Electronically Signed: By: Rolm Baptise M.D. On: 01/01/2019 10:55    Assessment & Plan:    Walker Kehr, MD

## 2020-11-23 NOTE — Assessment & Plan Note (Signed)
Mirapex at 6 pm - better RTC 6 mo

## 2020-11-26 DIAGNOSIS — M5431 Sciatica, right side: Secondary | ICD-10-CM | POA: Diagnosis not present

## 2020-11-26 DIAGNOSIS — M9904 Segmental and somatic dysfunction of sacral region: Secondary | ICD-10-CM | POA: Diagnosis not present

## 2020-11-26 DIAGNOSIS — M9903 Segmental and somatic dysfunction of lumbar region: Secondary | ICD-10-CM | POA: Diagnosis not present

## 2020-11-26 DIAGNOSIS — M9905 Segmental and somatic dysfunction of pelvic region: Secondary | ICD-10-CM | POA: Diagnosis not present

## 2020-12-01 DIAGNOSIS — L821 Other seborrheic keratosis: Secondary | ICD-10-CM | POA: Diagnosis not present

## 2020-12-01 DIAGNOSIS — D692 Other nonthrombocytopenic purpura: Secondary | ICD-10-CM | POA: Diagnosis not present

## 2020-12-08 DIAGNOSIS — R928 Other abnormal and inconclusive findings on diagnostic imaging of breast: Secondary | ICD-10-CM | POA: Diagnosis not present

## 2020-12-08 LAB — HM MAMMOGRAPHY

## 2020-12-09 ENCOUNTER — Other Ambulatory Visit: Payer: Self-pay | Admitting: Internal Medicine

## 2020-12-09 MED ORDER — PRAMIPEXOLE DIHYDROCHLORIDE 1 MG PO TABS
0.5000 mg | ORAL_TABLET | Freq: Every day | ORAL | 3 refills | Status: DC
Start: 2020-12-09 — End: 2021-10-06

## 2020-12-11 ENCOUNTER — Encounter: Payer: Self-pay | Admitting: Internal Medicine

## 2020-12-15 DIAGNOSIS — N6002 Solitary cyst of left breast: Secondary | ICD-10-CM | POA: Diagnosis not present

## 2020-12-17 DIAGNOSIS — M9903 Segmental and somatic dysfunction of lumbar region: Secondary | ICD-10-CM | POA: Diagnosis not present

## 2020-12-17 DIAGNOSIS — M9904 Segmental and somatic dysfunction of sacral region: Secondary | ICD-10-CM | POA: Diagnosis not present

## 2020-12-17 DIAGNOSIS — M5431 Sciatica, right side: Secondary | ICD-10-CM | POA: Diagnosis not present

## 2020-12-17 DIAGNOSIS — M9905 Segmental and somatic dysfunction of pelvic region: Secondary | ICD-10-CM | POA: Diagnosis not present

## 2020-12-28 ENCOUNTER — Other Ambulatory Visit: Payer: Self-pay | Admitting: Internal Medicine

## 2021-01-07 DIAGNOSIS — M9903 Segmental and somatic dysfunction of lumbar region: Secondary | ICD-10-CM | POA: Diagnosis not present

## 2021-01-07 DIAGNOSIS — M9905 Segmental and somatic dysfunction of pelvic region: Secondary | ICD-10-CM | POA: Diagnosis not present

## 2021-01-07 DIAGNOSIS — M9904 Segmental and somatic dysfunction of sacral region: Secondary | ICD-10-CM | POA: Diagnosis not present

## 2021-01-07 DIAGNOSIS — M5431 Sciatica, right side: Secondary | ICD-10-CM | POA: Diagnosis not present

## 2021-01-08 DIAGNOSIS — H40013 Open angle with borderline findings, low risk, bilateral: Secondary | ICD-10-CM | POA: Diagnosis not present

## 2021-01-08 DIAGNOSIS — H25813 Combined forms of age-related cataract, bilateral: Secondary | ICD-10-CM | POA: Diagnosis not present

## 2021-01-08 DIAGNOSIS — H353131 Nonexudative age-related macular degeneration, bilateral, early dry stage: Secondary | ICD-10-CM | POA: Diagnosis not present

## 2021-01-21 DIAGNOSIS — M5431 Sciatica, right side: Secondary | ICD-10-CM | POA: Diagnosis not present

## 2021-01-21 DIAGNOSIS — M9905 Segmental and somatic dysfunction of pelvic region: Secondary | ICD-10-CM | POA: Diagnosis not present

## 2021-01-21 DIAGNOSIS — M9903 Segmental and somatic dysfunction of lumbar region: Secondary | ICD-10-CM | POA: Diagnosis not present

## 2021-01-21 DIAGNOSIS — M9904 Segmental and somatic dysfunction of sacral region: Secondary | ICD-10-CM | POA: Diagnosis not present

## 2021-01-24 DIAGNOSIS — S80212A Abrasion, left knee, initial encounter: Secondary | ICD-10-CM | POA: Diagnosis not present

## 2021-01-24 DIAGNOSIS — M79642 Pain in left hand: Secondary | ICD-10-CM | POA: Diagnosis not present

## 2021-01-24 DIAGNOSIS — S60222A Contusion of left hand, initial encounter: Secondary | ICD-10-CM | POA: Diagnosis not present

## 2021-01-24 DIAGNOSIS — M1812 Unilateral primary osteoarthritis of first carpometacarpal joint, left hand: Secondary | ICD-10-CM | POA: Diagnosis not present

## 2021-02-03 DIAGNOSIS — Z01419 Encounter for gynecological examination (general) (routine) without abnormal findings: Secondary | ICD-10-CM | POA: Diagnosis not present

## 2021-02-03 DIAGNOSIS — N841 Polyp of cervix uteri: Secondary | ICD-10-CM | POA: Diagnosis not present

## 2021-02-03 DIAGNOSIS — Z124 Encounter for screening for malignant neoplasm of cervix: Secondary | ICD-10-CM | POA: Diagnosis not present

## 2021-02-04 DIAGNOSIS — M9904 Segmental and somatic dysfunction of sacral region: Secondary | ICD-10-CM | POA: Diagnosis not present

## 2021-02-04 DIAGNOSIS — M5431 Sciatica, right side: Secondary | ICD-10-CM | POA: Diagnosis not present

## 2021-02-04 DIAGNOSIS — M9905 Segmental and somatic dysfunction of pelvic region: Secondary | ICD-10-CM | POA: Diagnosis not present

## 2021-02-04 DIAGNOSIS — M9903 Segmental and somatic dysfunction of lumbar region: Secondary | ICD-10-CM | POA: Diagnosis not present

## 2021-02-05 DIAGNOSIS — M79642 Pain in left hand: Secondary | ICD-10-CM | POA: Diagnosis not present

## 2021-02-15 DIAGNOSIS — D225 Melanocytic nevi of trunk: Secondary | ICD-10-CM | POA: Diagnosis not present

## 2021-02-15 DIAGNOSIS — Z85820 Personal history of malignant melanoma of skin: Secondary | ICD-10-CM | POA: Diagnosis not present

## 2021-02-15 DIAGNOSIS — L720 Epidermal cyst: Secondary | ICD-10-CM | POA: Diagnosis not present

## 2021-02-15 DIAGNOSIS — L821 Other seborrheic keratosis: Secondary | ICD-10-CM | POA: Diagnosis not present

## 2021-02-18 DIAGNOSIS — M5431 Sciatica, right side: Secondary | ICD-10-CM | POA: Diagnosis not present

## 2021-02-18 DIAGNOSIS — M9903 Segmental and somatic dysfunction of lumbar region: Secondary | ICD-10-CM | POA: Diagnosis not present

## 2021-02-18 DIAGNOSIS — M9905 Segmental and somatic dysfunction of pelvic region: Secondary | ICD-10-CM | POA: Diagnosis not present

## 2021-02-18 DIAGNOSIS — M9904 Segmental and somatic dysfunction of sacral region: Secondary | ICD-10-CM | POA: Diagnosis not present

## 2021-02-19 DIAGNOSIS — U071 COVID-19: Secondary | ICD-10-CM | POA: Diagnosis not present

## 2021-02-19 DIAGNOSIS — Z23 Encounter for immunization: Secondary | ICD-10-CM | POA: Diagnosis not present

## 2021-03-04 DIAGNOSIS — M9904 Segmental and somatic dysfunction of sacral region: Secondary | ICD-10-CM | POA: Diagnosis not present

## 2021-03-04 DIAGNOSIS — M9903 Segmental and somatic dysfunction of lumbar region: Secondary | ICD-10-CM | POA: Diagnosis not present

## 2021-03-04 DIAGNOSIS — M9905 Segmental and somatic dysfunction of pelvic region: Secondary | ICD-10-CM | POA: Diagnosis not present

## 2021-03-04 DIAGNOSIS — M5431 Sciatica, right side: Secondary | ICD-10-CM | POA: Diagnosis not present

## 2021-03-05 DIAGNOSIS — M79642 Pain in left hand: Secondary | ICD-10-CM | POA: Diagnosis not present

## 2021-03-25 DIAGNOSIS — M9903 Segmental and somatic dysfunction of lumbar region: Secondary | ICD-10-CM | POA: Diagnosis not present

## 2021-03-25 DIAGNOSIS — M9904 Segmental and somatic dysfunction of sacral region: Secondary | ICD-10-CM | POA: Diagnosis not present

## 2021-03-25 DIAGNOSIS — M9905 Segmental and somatic dysfunction of pelvic region: Secondary | ICD-10-CM | POA: Diagnosis not present

## 2021-03-25 DIAGNOSIS — M5431 Sciatica, right side: Secondary | ICD-10-CM | POA: Diagnosis not present

## 2021-04-15 DIAGNOSIS — M9904 Segmental and somatic dysfunction of sacral region: Secondary | ICD-10-CM | POA: Diagnosis not present

## 2021-04-15 DIAGNOSIS — M9903 Segmental and somatic dysfunction of lumbar region: Secondary | ICD-10-CM | POA: Diagnosis not present

## 2021-04-15 DIAGNOSIS — M9905 Segmental and somatic dysfunction of pelvic region: Secondary | ICD-10-CM | POA: Diagnosis not present

## 2021-04-15 DIAGNOSIS — M5431 Sciatica, right side: Secondary | ICD-10-CM | POA: Diagnosis not present

## 2021-04-17 DIAGNOSIS — M1712 Unilateral primary osteoarthritis, left knee: Secondary | ICD-10-CM | POA: Diagnosis not present

## 2021-04-17 DIAGNOSIS — M25552 Pain in left hip: Secondary | ICD-10-CM | POA: Diagnosis not present

## 2021-04-17 DIAGNOSIS — Z23 Encounter for immunization: Secondary | ICD-10-CM | POA: Diagnosis not present

## 2021-04-27 DIAGNOSIS — M25551 Pain in right hip: Secondary | ICD-10-CM | POA: Diagnosis not present

## 2021-04-27 DIAGNOSIS — M1712 Unilateral primary osteoarthritis, left knee: Secondary | ICD-10-CM | POA: Diagnosis not present

## 2021-04-27 DIAGNOSIS — M25562 Pain in left knee: Secondary | ICD-10-CM | POA: Diagnosis not present

## 2021-04-29 DIAGNOSIS — M9905 Segmental and somatic dysfunction of pelvic region: Secondary | ICD-10-CM | POA: Diagnosis not present

## 2021-04-29 DIAGNOSIS — M5431 Sciatica, right side: Secondary | ICD-10-CM | POA: Diagnosis not present

## 2021-04-29 DIAGNOSIS — M9904 Segmental and somatic dysfunction of sacral region: Secondary | ICD-10-CM | POA: Diagnosis not present

## 2021-04-29 DIAGNOSIS — M9903 Segmental and somatic dysfunction of lumbar region: Secondary | ICD-10-CM | POA: Diagnosis not present

## 2021-05-11 DIAGNOSIS — M25551 Pain in right hip: Secondary | ICD-10-CM | POA: Diagnosis not present

## 2021-05-13 DIAGNOSIS — M9904 Segmental and somatic dysfunction of sacral region: Secondary | ICD-10-CM | POA: Diagnosis not present

## 2021-05-13 DIAGNOSIS — M9905 Segmental and somatic dysfunction of pelvic region: Secondary | ICD-10-CM | POA: Diagnosis not present

## 2021-05-13 DIAGNOSIS — M5431 Sciatica, right side: Secondary | ICD-10-CM | POA: Diagnosis not present

## 2021-05-13 DIAGNOSIS — M9903 Segmental and somatic dysfunction of lumbar region: Secondary | ICD-10-CM | POA: Diagnosis not present

## 2021-05-18 DIAGNOSIS — M25551 Pain in right hip: Secondary | ICD-10-CM | POA: Diagnosis not present

## 2021-05-18 DIAGNOSIS — M13862 Other specified arthritis, left knee: Secondary | ICD-10-CM | POA: Diagnosis not present

## 2021-05-19 DIAGNOSIS — M5431 Sciatica, right side: Secondary | ICD-10-CM | POA: Diagnosis not present

## 2021-05-19 DIAGNOSIS — M9903 Segmental and somatic dysfunction of lumbar region: Secondary | ICD-10-CM | POA: Diagnosis not present

## 2021-05-19 DIAGNOSIS — M9905 Segmental and somatic dysfunction of pelvic region: Secondary | ICD-10-CM | POA: Diagnosis not present

## 2021-05-19 DIAGNOSIS — M9904 Segmental and somatic dysfunction of sacral region: Secondary | ICD-10-CM | POA: Diagnosis not present

## 2021-05-20 ENCOUNTER — Other Ambulatory Visit: Payer: Self-pay | Admitting: Internal Medicine

## 2021-05-24 ENCOUNTER — Ambulatory Visit: Payer: Medicare Other | Admitting: Internal Medicine

## 2021-05-24 ENCOUNTER — Ambulatory Visit: Payer: Medicare Other

## 2021-05-24 ENCOUNTER — Encounter: Payer: Self-pay | Admitting: Internal Medicine

## 2021-05-24 ENCOUNTER — Ambulatory Visit (INDEPENDENT_AMBULATORY_CARE_PROVIDER_SITE_OTHER): Payer: Medicare Other | Admitting: Internal Medicine

## 2021-05-24 ENCOUNTER — Other Ambulatory Visit: Payer: Self-pay

## 2021-05-24 VITALS — BP 140/78 | HR 65 | Temp 98.2°F | Ht 63.5 in | Wt 148.6 lb

## 2021-05-24 DIAGNOSIS — I2510 Atherosclerotic heart disease of native coronary artery without angina pectoris: Secondary | ICD-10-CM | POA: Diagnosis not present

## 2021-05-24 DIAGNOSIS — E039 Hypothyroidism, unspecified: Secondary | ICD-10-CM | POA: Diagnosis not present

## 2021-05-24 DIAGNOSIS — E538 Deficiency of other specified B group vitamins: Secondary | ICD-10-CM

## 2021-05-24 DIAGNOSIS — M170 Bilateral primary osteoarthritis of knee: Secondary | ICD-10-CM

## 2021-05-24 DIAGNOSIS — M25552 Pain in left hip: Secondary | ICD-10-CM

## 2021-05-24 DIAGNOSIS — I2583 Coronary atherosclerosis due to lipid rich plaque: Secondary | ICD-10-CM

## 2021-05-24 DIAGNOSIS — E785 Hyperlipidemia, unspecified: Secondary | ICD-10-CM | POA: Diagnosis not present

## 2021-05-24 DIAGNOSIS — F5101 Primary insomnia: Secondary | ICD-10-CM | POA: Diagnosis not present

## 2021-05-24 LAB — CBC WITH DIFFERENTIAL/PLATELET
Basophils Absolute: 0.1 10*3/uL (ref 0.0–0.1)
Basophils Relative: 1 % (ref 0.0–3.0)
Eosinophils Absolute: 0.3 10*3/uL (ref 0.0–0.7)
Eosinophils Relative: 3.6 % (ref 0.0–5.0)
HCT: 37.3 % (ref 36.0–46.0)
Hemoglobin: 12.4 g/dL (ref 12.0–15.0)
Lymphocytes Relative: 33.8 % (ref 12.0–46.0)
Lymphs Abs: 2.6 10*3/uL (ref 0.7–4.0)
MCHC: 33.3 g/dL (ref 30.0–36.0)
MCV: 85.5 fl (ref 78.0–100.0)
Monocytes Absolute: 0.7 10*3/uL (ref 0.1–1.0)
Monocytes Relative: 8.8 % (ref 3.0–12.0)
Neutro Abs: 4.1 10*3/uL (ref 1.4–7.7)
Neutrophils Relative %: 52.8 % (ref 43.0–77.0)
Platelets: 321 10*3/uL (ref 150.0–400.0)
RBC: 4.37 Mil/uL (ref 3.87–5.11)
RDW: 15 % (ref 11.5–15.5)
WBC: 7.7 10*3/uL (ref 4.0–10.5)

## 2021-05-24 LAB — TSH: TSH: 3.59 u[IU]/mL (ref 0.35–5.50)

## 2021-05-24 LAB — LIPID PANEL
Cholesterol: 126 mg/dL (ref 0–200)
HDL: 64.9 mg/dL (ref >39.00)
LDL Cholesterol: 42 mg/dL (ref 0–99)
NonHDL: 60.75
Total CHOL/HDL Ratio: 2
Triglycerides: 92 mg/dL (ref 0.0–149.0)
VLDL: 18.4 mg/dL (ref 0.0–40.0)

## 2021-05-24 LAB — COMPREHENSIVE METABOLIC PANEL
ALT: 17 U/L (ref 0–35)
AST: 25 U/L (ref 0–37)
Albumin: 4.3 g/dL (ref 3.5–5.2)
Alkaline Phosphatase: 71 U/L (ref 39–117)
BUN: 22 mg/dL (ref 6–23)
CO2: 29 mEq/L (ref 19–32)
Calcium: 9.4 mg/dL (ref 8.4–10.5)
Chloride: 103 mEq/L (ref 96–112)
Creatinine, Ser: 0.78 mg/dL (ref 0.40–1.20)
GFR: 70.69 mL/min (ref >60.00)
Glucose, Bld: 97 mg/dL (ref 70–99)
Potassium: 4.1 mEq/L (ref 3.5–5.1)
Sodium: 141 mEq/L (ref 135–145)
Total Bilirubin: 0.4 mg/dL (ref 0.2–1.2)
Total Protein: 7.3 g/dL (ref 6.0–8.3)

## 2021-05-24 LAB — T4, FREE: Free T4: 0.72 ng/dL (ref 0.60–1.60)

## 2021-05-24 NOTE — Assessment & Plan Note (Signed)
Worse - L knee - TKR pending in Nov 2022

## 2021-05-24 NOTE — Assessment & Plan Note (Signed)
On B12 

## 2021-05-24 NOTE — Addendum Note (Signed)
Addended by: Boris Lown B on: 05/24/2021 08:51 AM   Modules accepted: Orders

## 2021-05-24 NOTE — Assessment & Plan Note (Signed)
2020 CT coronary calcium score is 289 On Pravastatin

## 2021-05-24 NOTE — Patient Instructions (Signed)
CT coronary calcium score test

## 2021-05-24 NOTE — Assessment & Plan Note (Signed)
Zolpidem  Potential benefits of a long term benzodiazepines  use as well as potential risks  and complications were explained to the patient and were aknowledged.  

## 2021-05-24 NOTE — Progress Notes (Signed)
Subjective:  Patient ID: Elizabeth Gray, female    DOB: [birth year]  Age: 80 y.o. MRN: [id number]  CC: Follow-up (6 month f/u) and Medication Refill (Need Zolpidem)   HPI Elizabeth Gray presents for L knee OA - worse, planning TKR in Non 2022 (Dr [name]). C/o R groin pain MRI w/ileo-psoas tendonitis. C/o insomnia, RLS, CAD   Outpatient Medications Prior to Visit  Medication Sig Dispense Refill   Alum Hydroxide-Mag Carbonate (GAVISCON PO) Take 1 tablet by mouth as needed (reflux). As directed as needed     aspirin 81 MG tablet Take 81 mg by mouth every other day.      Cholecalciferol (VITAMIN D PO) Take 1,000 Units by mouth every other day.      cyanocobalamin (,VITAMIN B-12,) 1000 MCG/ML injection INJECT 1 ML INTRAMUSCULARLY EVERY 14 DAYS 10 mL 3   EUTHYROX 100 MCG tablet Take 1 tablet by mouth once daily 90 tablet 3   Flaxseed, Linseed, (FLAXSEED OIL) OIL Take 1 capsule by mouth daily.     fluticasone (FLOVENT HFA) 110 MCG/ACT inhaler Inhale 2 puffs into the lungs 2 (two) times a day. 1 Inhaler 12   GLUCOSAMINE-CHONDROITIN-MSM-D3 PO Take 1 tablet by mouth 2 (two) times daily.     Multiple Vitamins-Minerals (ICAPS AREDS FORMULA PO) Take 1 capsule by mouth daily.     omeprazole-sodium bicarbonate (ZEGERID) 40-1100 MG capsule TAKE 1 CAPSULE BY MOUTH ONCE DAILY BEFORE BREAKFAST 90 capsule 4   Polyethyl Glycol-Propyl Glycol 0.4-0.3 % SOLN Apply 1 drop to eye as needed (dry eyes).      pramipexole (MIRAPEX) 1 MG tablet Take 0.5-1 tablets (0.5-1 mg total) by mouth at bedtime. 90 tablet 3   pravastatin (PRAVACHOL) 20 MG tablet Take 1 tablet by mouth once daily 90 tablet 3   SYRINGE-NEEDLE, DISP, 3 ML (B-D 3CC LUER-LOK SYR 25GX1") 25G X 1" 3 ML MISC USE AS DIRECTED . APPOINTMENT REQUIRED FOR FUTURE REFILLS 8 each 11   traMADol (ULTRAM) 50 MG tablet Take 2 tablets (100 mg total) by mouth at bedtime. 90 tablet 1   vitamin C (ASCORBIC ACID) 500 MG tablet Take 500 mg by mouth daily.      zolpidem (AMBIEN) 10 MG tablet TAKE 1 TABLET BY MOUTH AT BEDTIME AS NEEDED FOR SLEEP 90 tablet 0   No facility-administered medications prior to visit.    ROS: Review of Systems  Constitutional:  Negative for activity change, appetite change, chills, fatigue and unexpected weight change.  HENT:  Negative for congestion, mouth sores and sinus pressure.   Eyes:  Negative for visual disturbance.  Respiratory:  Negative for cough and chest tightness.   Gastrointestinal:  Negative for abdominal pain and nausea.  Genitourinary:  Negative for difficulty urinating, frequency and vaginal pain.  Musculoskeletal:  Positive for arthralgias and gait problem. Negative for back pain.  Skin:  Negative for pallor and rash.  Neurological:  Negative for dizziness, tremors, weakness, numbness and headaches.  Psychiatric/Behavioral:  Positive for sleep disturbance. Negative for confusion.    Objective:  BP 140/78 (BP Location: Left Arm)   Pulse 65   Temp 98.2 F (36.8 C) (Oral)   Ht 5' 3.5" (1.613 m)   Wt 148 lb 9.6 oz (67.4 kg)   SpO2 96%   BMI 25.91 kg/m   BP Readings from Last 3 Encounters:  05/24/21 140/78  11/23/20 130/70  10/02/20 118/60    Wt Readings from Last 3 Encounters:  05/24/21 148 lb 9.6 oz (67.4 kg)  11/23/20 151 lb 9.6 oz (68.8 kg)  10/02/20 157 lb (71.2 kg)    Physical Exam Constitutional:      General: She is not in acute distress.    Appearance: She is well-developed.  HENT:     Head: Normocephalic.     Right Ear: External ear normal.     Left Ear: External ear normal.     Nose: Nose normal.  Eyes:     General:        Right eye: No discharge.        Left eye: No discharge.     Conjunctiva/sclera: Conjunctivae normal.     Pupils: Pupils are equal, round, and reactive to light.  Neck:     Thyroid: No thyromegaly.     Vascular: No JVD.     Trachea: No tracheal deviation.  Cardiovascular:     Rate and Rhythm: Normal rate and regular rhythm.     Heart sounds:  Normal heart sounds.  Pulmonary:     Effort: No respiratory distress.     Breath sounds: No stridor. No wheezing.  Abdominal:     General: Bowel sounds are normal. There is no distension.     Palpations: Abdomen is soft. There is no mass.     Tenderness: There is no abdominal tenderness. There is no guarding or rebound.  Musculoskeletal:        General: No tenderness.     Cervical back: Normal range of motion and neck supple. No rigidity.  Lymphadenopathy:     Cervical: No cervical adenopathy.  Skin:    Findings: No erythema or rash.  Neurological:     Mental Status: She is oriented to person, place, and time.     Cranial Nerves: No cranial nerve deficit.     Motor: No abnormal muscle tone.     Coordination: Coordination normal.     Deep Tendon Reflexes: Reflexes normal.  Psychiatric:        Behavior: Behavior normal.        Thought Content: Thought content normal.        Judgment: Judgment normal.    Lab Results  Component Value Date   WBC 6.9 09/23/2020   HGB 13.5 09/23/2020   HCT 39.8 09/23/2020   PLT 319.0 09/23/2020   GLUCOSE 98 09/23/2020   CHOL 133 09/23/2020   TRIG 141.0 09/23/2020   HDL 56.90 09/23/2020   LDLCALC 47 09/23/2020   ALT 20 09/23/2020   AST 24 09/23/2020   NA 141 09/23/2020   K 4.1 09/23/2020   CL 97 09/23/2020   CREATININE 0.92 09/23/2020   BUN 27 (H) 09/23/2020   CO2 37 (H) 09/23/2020   TSH 0.69 09/23/2020   INR 3.03 (H) 05/12/2014    CT CARDIAC SCORING  Addendum Date: 01/01/2019   ADDENDUM REPORT: 01/01/2019 17:04 CLINICAL DATA:  Risk stratification EXAM: Coronary Calcium Score TECHNIQUE: The patient was scanned on a Siemens Somatom 64 slice scanner. Axial non-contrast 3 mm slices were carried out through the heart. The data set was analyzed on a dedicated work station and scored using the [place]. FINDINGS: Non-cardiac: See separate report from [facility]. Ascending aorta: Normal diameter 3.2 cm Pericardium: Normal Coronary  arteries: Calcium noted in proximal RCA/Circumflex and proximal and mid LAD IMPRESSION: Coronary calcium score of 289. This was 1 th percentile for age and sex matched control. Jenkins Rouge Electronically Signed   By: Jenkins Rouge M.D.   On: 01/01/2019 17:04   Result Date:  01/01/2019 EXAM: OVER-READ INTERPRETATION  CT CHEST The following report is an over-read performed by radiologist Dr. Rolm Baptise of [facility], [place] on 01/01/2019. This over-read does not include interpretation of cardiac or coronary anatomy or pathology. The coronary calcium score interpretation by the cardiologist is attached. COMPARISON:  01/03/2007 FINDINGS: Vascular: Heart is normal size. Aorta is normal caliber. Scattered aortic root calcifications. Mediastinum/Nodes: No adenopathy in the lower mediastinum or hila. Lungs/Pleura: Visualized lungs clear.  No effusions. Upper Abdomen: Imaging into the upper abdomen shows no acute findings. Musculoskeletal: Chest wall soft tissues are unremarkable. No acute bony abnormality. IMPRESSION: No acute extra cardiac abnormality. Scattered aortic root calcifications. Electronically Signed: By: Rolm Baptise M.D. On: 01/01/2019 10:55    Assessment & Plan:   Problem List Items Addressed This Visit     B12 deficiency    On B12      Relevant Orders   CBC with Differential/Platelet   Bilateral primary osteoarthritis of knee    Worse - L knee - TKR pending in Nov 2022      Coronary atherosclerosis     On Pravastatin      Relevant Orders   CBC with Differential/Platelet   Dyslipidemia    2020 CT coronary calcium score is 289 On Pravastatin      Relevant Orders   Comprehensive metabolic panel   TSH   T4, free   Lipid panel   Hypothyroidism - Primary   Relevant Orders   CBC with Differential/Platelet   Comprehensive metabolic panel   TSH   T4, free   Insomnia    Zolpidem   Potential benefits of a long term benzodiazepines  use as well as potential risks  and  complications were explained to the patient and were aknowledged.       Left hip pain    New - R groin pain MRI w/ileo-psoas tendonitis. F/u w/Dr [name]         No orders of the defined types were placed in this encounter.     Follow-up: Return in about 6 months (around 11/22/2021) for Wellness Exam.  Walker Kehr, MD

## 2021-05-24 NOTE — Assessment & Plan Note (Signed)
New - R groin pain MRI w/ileo-psoas tendonitis. F/u w/Dr [name]

## 2021-05-24 NOTE — Assessment & Plan Note (Signed)
On Pravastatin 

## 2021-05-27 DIAGNOSIS — M9903 Segmental and somatic dysfunction of lumbar region: Secondary | ICD-10-CM | POA: Diagnosis not present

## 2021-05-27 DIAGNOSIS — M9905 Segmental and somatic dysfunction of pelvic region: Secondary | ICD-10-CM | POA: Diagnosis not present

## 2021-05-27 DIAGNOSIS — M9904 Segmental and somatic dysfunction of sacral region: Secondary | ICD-10-CM | POA: Diagnosis not present

## 2021-05-27 DIAGNOSIS — M5431 Sciatica, right side: Secondary | ICD-10-CM | POA: Diagnosis not present

## 2021-06-10 DIAGNOSIS — M9904 Segmental and somatic dysfunction of sacral region: Secondary | ICD-10-CM | POA: Diagnosis not present

## 2021-06-10 DIAGNOSIS — M9905 Segmental and somatic dysfunction of pelvic region: Secondary | ICD-10-CM | POA: Diagnosis not present

## 2021-06-10 DIAGNOSIS — M5431 Sciatica, right side: Secondary | ICD-10-CM | POA: Diagnosis not present

## 2021-06-10 DIAGNOSIS — M9903 Segmental and somatic dysfunction of lumbar region: Secondary | ICD-10-CM | POA: Diagnosis not present

## 2021-06-16 ENCOUNTER — Ambulatory Visit (INDEPENDENT_AMBULATORY_CARE_PROVIDER_SITE_OTHER): Payer: Medicare Other

## 2021-06-16 ENCOUNTER — Ambulatory Visit (INDEPENDENT_AMBULATORY_CARE_PROVIDER_SITE_OTHER): Payer: Medicare Other | Admitting: Pulmonary Disease

## 2021-06-16 ENCOUNTER — Other Ambulatory Visit: Payer: Self-pay

## 2021-06-16 ENCOUNTER — Encounter: Payer: Self-pay | Admitting: Pulmonary Disease

## 2021-06-16 VITALS — BP 136/74 | HR 95 | Temp 98.3°F | Ht 63.5 in | Wt 154.8 lb

## 2021-06-16 DIAGNOSIS — G4733 Obstructive sleep apnea (adult) (pediatric): Secondary | ICD-10-CM

## 2021-06-16 DIAGNOSIS — D869 Sarcoidosis, unspecified: Secondary | ICD-10-CM

## 2021-06-16 DIAGNOSIS — Z9989 Dependence on other enabling machines and devices: Secondary | ICD-10-CM | POA: Diagnosis not present

## 2021-06-16 NOTE — Patient Instructions (Signed)
CXR today Call [phone] for mask fitting session appt Good luck with knee replacement !

## 2021-06-16 NOTE — Assessment & Plan Note (Signed)
CPAP compliance has decreased and this seems to be mask leak issue.  She  has difficulty using a full facemask now.  We will refer her to a mass desensitization/fitting session and hopefully find a better fitting mask to improve her compliance. Her insomnia may be related to knee pain , doubt that we need to do anything directly about this at the current time. She has an active lifestyle and works numerous jobs  CPAP compliance with goal of at least 4-6 hrs every night is the expectation. Advised against medications with sedative side effects Cautioned against driving when sleepy - understanding that sleepiness will vary on a day to day basis

## 2021-06-16 NOTE — Progress Notes (Signed)
   Subjective:    Patient ID: Elizabeth Gray, female    DOB: 22-Nov-1940, 80 y.o.   MRN: [id number]  HPI  80 yo for FU of sarcoid & OSA -on cpap 12cmh20 . Sarcoidosis is a remote diagnosis and in remission for several years.  She reports left knee pain and total knee replacement is planned in January. She reports some degree of insomnia in spite of taking Ambien.  She takes Mirapex for restless leg syndrome and this seems to control her symptoms, legs are not keeping her awake at night.  She reports right groin pain and is found to have a ruptured tendon which is now healing. She tried nasal pillows but has now reverted to full facemask.  She reports her leak around her mask. CPAP download was reviewed which shows good control of events on 12 cm but compliance has decreased, leak is not significant    Significant tests/ events reviewed  PSG 06/2008 showed predominantly REM related obstructive sleep apnea , corrected by CPAP 12 cm.   CT cors 01/2019 no e/o sarcoid  CT 2008  Subtle upper lobe predominant perilymphatic nodularity with mildly prominent mediastinal and bihilar lymphoid tissue as well as gastrohepatic ligament lymph node.  Question sarcoid.  Nodular splenic enhancement pattern   05/2012 RAST neg  Review of Systems neg for any significant sore throat, dysphagia, itching, sneezing, nasal congestion or excess/ purulent secretions, fever, chills, sweats, unintended wt loss, pleuritic or exertional cp, hempoptysis, orthopnea pnd or change in chronic leg swelling. Also denies presyncope, palpitations, heartburn, abdominal pain, nausea, vomiting, diarrhea or change in bowel or urinary habits, dysuria,hematuria, rash, arthralgias, visual complaints, headache, numbness weakness or ataxia.     Objective:   Physical Exam   Gen. Pleasant, elderly,well-nourished, in no distress ENT - no thrush, no pallor/icterus,no post nasal drip Neck: No JVD, no thyromegaly, no carotid  bruits Lungs: no use of accessory muscles, no dullness to percussion, clear without rales or rhonchi  Cardiovascular: Rhythm regular, heart sounds  normal, no murmurs or gallops, no peripheral edema Musculoskeletal: No deformities, no cyanosis or clubbing       Assessment & Plan:    Preop clearance -sarcoidosis is a remote diagnosis and does not seem to be an active issue. Will obtain a chest x-ray.  She would be low risk for postop pulmonary complications from knee replacement surgery

## 2021-06-21 ENCOUNTER — Telehealth: Payer: Self-pay | Admitting: Pulmonary Disease

## 2021-06-21 DIAGNOSIS — G4733 Obstructive sleep apnea (adult) (pediatric): Secondary | ICD-10-CM

## 2021-06-22 NOTE — Telephone Encounter (Signed)
I have called and LM on VM for the pt to call our office back.  Need to know what DME company she uses to send a new mask to .

## 2021-06-22 NOTE — Telephone Encounter (Signed)
Patient uses [place] for CPAP machine. Patient phone number is [phone].

## 2021-06-22 NOTE — Telephone Encounter (Signed)
Order has been placed for mask fitting. Nothing further needed at this time.

## 2021-07-01 DIAGNOSIS — M5431 Sciatica, right side: Secondary | ICD-10-CM | POA: Diagnosis not present

## 2021-07-01 DIAGNOSIS — M9904 Segmental and somatic dysfunction of sacral region: Secondary | ICD-10-CM | POA: Diagnosis not present

## 2021-07-01 DIAGNOSIS — M9905 Segmental and somatic dysfunction of pelvic region: Secondary | ICD-10-CM | POA: Diagnosis not present

## 2021-07-01 DIAGNOSIS — M9903 Segmental and somatic dysfunction of lumbar region: Secondary | ICD-10-CM | POA: Diagnosis not present

## 2021-07-07 ENCOUNTER — Ambulatory Visit (HOSPITAL_BASED_OUTPATIENT_CLINIC_OR_DEPARTMENT_OTHER): Payer: Medicare Other | Attending: Pulmonary Disease | Admitting: Pulmonary Disease

## 2021-07-07 DIAGNOSIS — G4733 Obstructive sleep apnea (adult) (pediatric): Secondary | ICD-10-CM

## 2021-07-07 DIAGNOSIS — Z9989 Dependence on other enabling machines and devices: Secondary | ICD-10-CM

## 2021-07-15 DIAGNOSIS — M9904 Segmental and somatic dysfunction of sacral region: Secondary | ICD-10-CM | POA: Diagnosis not present

## 2021-07-15 DIAGNOSIS — M9903 Segmental and somatic dysfunction of lumbar region: Secondary | ICD-10-CM | POA: Diagnosis not present

## 2021-07-15 DIAGNOSIS — M5431 Sciatica, right side: Secondary | ICD-10-CM | POA: Diagnosis not present

## 2021-07-15 DIAGNOSIS — M9905 Segmental and somatic dysfunction of pelvic region: Secondary | ICD-10-CM | POA: Diagnosis not present

## 2021-07-19 NOTE — Patient Instructions (Addendum)
DUE TO COVID-19 ONLY ONE VISITOR IS ALLOWED TO COME WITH YOU AND STAY IN THE WAITING ROOM ONLY DURING PRE OP AND PROCEDURE DAY OF SURGERY IF YOU ARE GOING HOME AFTER SURGERY. IF YOU ARE SPENDING THE NIGHT 2 PEOPLE MAY VISIT WITH YOU IN YOUR PRIVATE ROOM AFTER SURGERY UNTIL VISITING  HOURS ARE OVER AT 800 PM AND 1  VISITOR  MAY  SPEND THE NIGHT.   YOU NEED TO HAVE A COVID 19 TEST ON_Day of surgery___               Elizabeth Gray     Your procedure is scheduled on: 08/06/21   Report to [facility] Main  Entrance   Report to admitting at  5:15 AM     Call this number if you have problems the morning of surgery [phone]    No food after midnight.    You may have clear liquid until 4:30 AM.    At 4:00 AM drink pre surgery drink.   Nothing by mouth after 4:30 AM.   CLEAR LIQUID DIET   Foods Allowed                                                                     Foods Excluded  Coffee and tea, regular and decaf                             liquids that you cannot  Plain Jell-O any favor except red or purple                                           see through such as: Fruit ices (not with fruit pulp)                                     milk, soups, orange juice  Iced Popsicles                                    All solid food Carbonated beverages, regular and diet                                    Cranberry, grape and apple juices Sports drinks like Gatorade Lightly seasoned clear broth or consume(fat free) Sugar   BRUSH YOUR TEETH MORNING OF SURGERY AND RINSE YOUR MOUTH OUT, NO CHEWING GUM CANDY OR MINTS.   Stop taking __ASA 81 mg_________on __________as instructed by _____________.   Contact your Surgeon/Cardiologist for instructions on Anticoagulant Therapy prior to surgery.      Take these medicines the morning of surgery with A SIP OF WATER: pramipexole. Euthyrox, Omeprazole  Use your inhaler and bring it with you to the hospital Bring your mask  and tubing  You may not have any metal on your body including hair pins and              piercings  Do not wear jewelry, make-up, lotions, powders or perfumes, deodorant             Do not wear nail polish on your fingernails.  Do not shave  48 hours prior to surgery.               Do not bring valuables to the hospital. Campus.  Contacts, dentures or bridgework may not be worn into surgery.  Leave suitcase in the car. After surgery it may be brought to your room.                 [facility] - Preparing for Surgery Before surgery, you can play an important role.  Because skin is not sterile, your skin needs to be as free of germs as possible.  You can reduce the number of germs on your skin by washing with CHG (chlorahexidine gluconate) soap before surgery.  CHG is an antiseptic cleaner which kills germs and bonds with the skin to continue killing germs even after washing. Please DO NOT use if you have an allergy to CHG or antibacterial soaps.  If your skin becomes reddened/irritated stop using the CHG and inform your nurse when you arrive at Short Stay. Do not shave (including legs and underarms) for at least 48 hours prior to the first CHG shower.  Please follow these instructions carefully:  1.  Shower with CHG Soap the night before surgery and the  morning of Surgery.  2.  If you choose to wash your hair, wash your hair first as usual with your  normal  shampoo.  3.  After you shampoo, rinse your hair and body thoroughly to remove the  shampoo.                            4.  Use CHG as you would any other liquid soap.  You can apply chg directly  to the skin and wash                       Gently with a scrungie or clean washcloth.  5.  Apply the CHG Soap to your body ONLY FROM THE NECK DOWN.   Do not use on face/ open                           Wound or open sores. Avoid contact with eyes, ears mouth and  genitals (private parts).                       Wash face,  Genitals (private parts) with your normal soap.             6.  Wash thoroughly, paying special attention to the area where your surgery  will be performed.  7.  Thoroughly rinse your body with warm water from the neck down.  8.  DO NOT shower/wash with your normal soap after using and rinsing off  the CHG Soap.                9.  Pat yourself dry with a clean towel.  10.  Wear clean pajamas.            11.  Place clean sheets on your bed the night of your first shower and do not  sleep with pets. Day of Surgery : Do not apply any lotions/deodorants the morning of surgery.  Please wear clean clothes to the hospital/surgery center.  FAILURE TO FOLLOW THESE INSTRUCTIONS MAY RESULT IN THE CANCELLATION OF YOUR SURGERY PATIENT SIGNATURE_________________________________  NURSE SIGNATURE__________________________________  ________________________________________________________________________   Elizabeth Gray  An incentive spirometer is a tool that can help keep your lungs clear and active. This tool measures how well you are filling your lungs with each breath. Taking long deep breaths may help reverse or decrease the chance of developing breathing (pulmonary) problems (especially infection) following: A long period of time when you are unable to move or be active. BEFORE THE PROCEDURE  If the spirometer includes an indicator to show your best effort, your nurse or respiratory therapist will set it to a desired goal. If possible, sit up straight or lean slightly forward. Try not to slouch. Hold the incentive spirometer in an upright position. INSTRUCTIONS FOR USE  Sit on the edge of your bed if possible, or sit up as far as you can in bed or on a chair. Hold the incentive spirometer in an upright position. Breathe out normally. Place the mouthpiece in your mouth and seal your lips tightly around it. Breathe in  slowly and as deeply as possible, raising the piston or the ball toward the top of the column. Hold your breath for 3-5 seconds or for as long as possible. Allow the piston or ball to fall to the bottom of the column. Remove the mouthpiece from your mouth and breathe out normally. Rest for a few seconds and repeat Steps 1 through 7 at least 10 times every 1-2 hours when you are awake. Take your time and take a few normal breaths between deep breaths. The spirometer may include an indicator to show your best effort. Use the indicator as a goal to work toward during each repetition. After each set of 10 deep breaths, practice coughing to be sure your lungs are clear. If you have an incision (the cut made at the time of surgery), support your incision when coughing by placing a pillow or rolled up towels firmly against it. Once you are able to get out of bed, walk around indoors and cough well. You may stop using the incentive spirometer when instructed by your caregiver.  RISKS AND COMPLICATIONS Take your time so you do not get dizzy or light-headed. If you are in pain, you may need to take or ask for pain medication before doing incentive spirometry. It is harder to take a deep breath if you are having pain. AFTER USE Rest and breathe slowly and easily. It can be helpful to keep track of a log of your progress. Your caregiver can provide you with a simple table to help with this. If you are using the spirometer at home, follow these instructions: [place] IF:  You are having difficultly using the spirometer. You have trouble using the spirometer as often as instructed. Your pain medication is not giving enough relief while using the spirometer. You develop fever of 100.5 F (38.1 C) or higher. SEEK IMMEDIATE MEDICAL CARE IF:  You cough up bloody sputum that had not been present before. You develop fever of 102 F (38.9 C) or greater. You develop worsening pain at or near  the incision  site. MAKE SURE YOU:  Understand these instructions. Will watch your condition. Will get help right away if you are not doing well or get worse. Document Released: 11/28/2006 Document Revised: 10/10/2011 Document Reviewed: 01/29/2007 [facility] Patient Information 2014 [place], [place].   ________________________________________________________________________

## 2021-07-21 ENCOUNTER — Encounter (HOSPITAL_COMMUNITY): Payer: Self-pay

## 2021-07-21 ENCOUNTER — Encounter (HOSPITAL_COMMUNITY)
Admission: RE | Admit: 2021-07-21 | Discharge: 2021-07-21 | Disposition: A | Discharge status: Home / Self Care | Payer: Medicare Other | Source: Ambulatory Visit | Attending: Orthopedic Surgery | Admitting: Orthopedic Surgery

## 2021-07-21 ENCOUNTER — Other Ambulatory Visit: Payer: Self-pay

## 2021-07-21 VITALS — BP 159/76 | HR 66 | Temp 97.4°F | Resp 18 | Ht 63.0 in | Wt 150.0 lb

## 2021-07-21 DIAGNOSIS — Z01818 Encounter for other preprocedural examination: Secondary | ICD-10-CM | POA: Diagnosis not present

## 2021-07-21 DIAGNOSIS — I2583 Coronary atherosclerosis due to lipid rich plaque: Secondary | ICD-10-CM | POA: Diagnosis not present

## 2021-07-21 DIAGNOSIS — I2510 Atherosclerotic heart disease of native coronary artery without angina pectoris: Secondary | ICD-10-CM | POA: Diagnosis not present

## 2021-07-21 LAB — BASIC METABOLIC PANEL
Anion gap: 8 (ref 5–15)
BUN: 21 mg/dL (ref 8–23)
CO2: 29 mmol/L (ref 22–32)
Calcium: 9.4 mg/dL (ref 8.9–10.3)
Chloride: 101 mmol/L (ref 98–111)
Creatinine, Ser: 0.62 mg/dL (ref 0.44–1.00)
GFR, Estimated: >60 mL/min (ref >60)
Glucose, Bld: 96 mg/dL (ref 70–99)
Potassium: 4.7 mmol/L (ref 3.5–5.1)
Sodium: 138 mmol/L (ref 135–145)

## 2021-07-21 LAB — CBC
HCT: 40.8 % (ref 36.0–46.0)
Hemoglobin: 12.9 g/dL (ref 12.0–15.0)
MCH: 28.4 pg (ref 26.0–34.0)
MCHC: 31.6 g/dL (ref 30.0–36.0)
MCV: 89.9 fL (ref 80.0–100.0)
Platelets: 339 10*3/uL (ref 150–400)
RBC: 4.54 MIL/uL (ref 3.87–5.11)
RDW: 14.2 % (ref 11.5–15.5)
WBC: 8.5 10*3/uL (ref 4.0–10.5)
nRBC: 0 % (ref 0.0–0.2)

## 2021-07-21 LAB — SURGICAL PCR SCREEN
MRSA, PCR: NEGATIVE
Staphylococcus aureus: NEGATIVE

## 2021-07-21 NOTE — Progress Notes (Signed)
COVID  DOS  PCP - Dr. Purnell Shoemaker Cardiologist - none  Chest x-ray - 06/17/21-epic EKG - 07/21/21-chart Stress Test - 2017 ECHO - 2016 Cardiac Cath - no Pacemaker/ICD device last checked:NA  Sleep Study - yes CPAP - yes  Fasting Blood Sugar - NA Checks Blood Sugar _____ times a day  Blood Thinner Instructions:only takes it once a week she will stop it  Aspirin Instructions: Last Dose:12/17  Anesthesia review: yes  Patient denies shortness of breath, fever, cough and chest pain at PAT appointment Pt has no SOB with any activities. She is an active person.  Patient verbalized understanding of instructions that were given to them at the PAT appointment. Patient was also instructed that they will need to review over the PAT instructions again at home before surgery. yes

## 2021-08-03 NOTE — H&P (Signed)
Patient's anticipated LOS is less than 2 midnights, meeting these requirements: - Younger than 34 - Lives within 1 hour of care - Has a competent adult at home to recover with post-op recover - NO history of  - Chronic pain requiring opiods  - Diabetes  - Coronary Artery Disease  - Heart failure  - Heart attack  - Stroke  - DVT/VTE  - Cardiac arrhythmia  - Respiratory Failure/COPD  - Renal failure  - Anemia  - Advanced Liver disease     Elizabeth Gray is an 81 y.o. female.    Chief Complaint: left knee pain  HPI: Pt is a 81 y.o. female complaining of left knee pain for multiple years. Pain had continually increased since the beginning. X-rays in the clinic show end-stage arthritic changes of the left knee. Pt has tried various conservative treatments which have failed to alleviate their symptoms, including injections and therapy. Various options are discussed with the patient. Risks, benefits and expectations were discussed with the patient. Patient understand the risks, benefits and expectations and wishes to proceed with surgery.   PCP:  Cassandria Anger, MD  D/C Plans: Home  PMH: Past Medical History:  Diagnosis Date   Allergy    Bursitis    Cancer ([place]) 03/22/2017   Melanoma in situ, lentigo maligna type   Deviated septum    External hemorrhoids without mention of complication 6387   Colonoscopy    Fatigue    GERD (gastroesophageal reflux disease)    Headache(784.0)    migraines   Heat rash    under the breasts.Marland Kitchenappeared on monday.Marland KitchenMarland KitchenBurning & itching, uses cortisone   Hypothyroidism    Iron deficiency anemia, unspecified    Lower esophageal ring 2008   EGD   Nonorganic sleep disorder, unspecified    Osteoarthritis    Osteoporosis    Pancreatitis    Personal history of colonic polyps    Polio 1948   Pulmonary sarcoidosis ([place])    Sleep apnea    cpap since 09 [facility]   Vitamin B12 deficiency    Vitamin D deficiency      PSH: Past Surgical History:  Procedure Laterality Date   [place]  2002   rt   CHOLECYSTECTOMY  2000   COLONOSCOPY  12/19/2006   Multiple diminutive polyps destroyed-removed (hyperpastic), internal hemorrhoids   ESOPHAGOGASTRODUODENOSCOPY  12/19/2006   lower esophageal ring dilated   HAND SURGERY  1997   left   KNEE ARTHROSCOPY Bilateral    multiple 2 on lft 1 on rt   [place]   Melanoma Removal  Right 2018   right face   TOE SURGERY Left    left foot next to little toe  joint rem   TOTAL KNEE ARTHROPLASTY Right 05/09/2014   Procedure: RIGHT TOTAL KNEE ARTHROPLASTY;  Surgeon: [name], MD;  Location: [facility];  Service: Orthopedics;  Laterality: Right;   UPPER GASTROINTESTINAL ENDOSCOPY     WISDOM TOOTH EXTRACTION      Social History:  reports that she quit smoking about 31 years ago. Her smoking use included cigarettes. She has a 17.00 pack-year smoking history. She has never used smokeless tobacco. She reports that she does not drink alcohol and does not use drugs.  Allergies:  Allergies  Allergen Reactions   Dexilant [Dexlansoprazole] Shortness Of Breath    "set my stomach on fire and made me SOB"   Gabapentin     "  Music apraxia"   Metoclopramide Hcl Other (See Comments)    REGLAN="burning in mouth"   Oxymetazoline Hcl Itching    AFRIN SPRAY   Restasis [Cyclosporine]     "can't use it"   Sucralfate Other (See Comments)    CARAFATE=Mouth sores    Medications: No current facility-administered medications for this encounter.   Current Outpatient Medications  Medication Sig Dispense Refill   Alum Hydroxide-Mag Carbonate (GAVISCON PO) Take 1 tablet by mouth 3 (three) times daily after meals.     aspirin 81 MG tablet Take 81 mg by mouth once a week.     Cholecalciferol (VITAMIN D PO) Take 1,000 Units by mouth every other day.      cyanocobalamin (,VITAMIN B-12,) 1000 MCG/ML injection INJECT 1 ML  INTRAMUSCULARLY EVERY 14 DAYS (Patient taking differently: Inject 1,000 mcg into the muscle every 21 ( twenty-one) days.) 10 mL 3   EUTHYROX 100 MCG tablet Take 1 tablet by mouth once daily 90 tablet 3   Flaxseed, Linseed, (FLAXSEED OIL) 1000 MG CAPS Take 1,000 mg by mouth daily.     Glucosamine-Chondroitin (MOVE FREE PO) Take 1 tablet by mouth in the morning and at bedtime.     magnesium oxide (MAG-OX) 400 MG tablet Take 400 mg by mouth at bedtime.     Multiple Vitamins-Minerals (ICAPS AREDS FORMULA PO) Take 1 capsule by mouth in the morning and at bedtime.     omeprazole-sodium bicarbonate (ZEGERID) 40-1100 MG capsule TAKE 1 CAPSULE BY MOUTH ONCE DAILY BEFORE BREAKFAST 90 capsule 4   Polyethyl Glycol-Propyl Glycol 0.4-0.3 % SOLN Place 2-3 drops into both eyes 2 (two) times daily as needed (dry eyes).     pramipexole (MIRAPEX) 1 MG tablet Take 0.5-1 tablets (0.5-1 mg total) by mouth at bedtime. (Patient taking differently: Take 1 mg by mouth at bedtime.) 90 tablet 3   pravastatin (PRAVACHOL) 20 MG tablet Take 1 tablet by mouth once daily (Patient taking differently: Take 20 mg by mouth at bedtime.) 90 tablet 3   SYRINGE-NEEDLE, DISP, 3 ML (B-D 3CC LUER-LOK SYR 25GX1") 25G X 1" 3 ML MISC USE AS DIRECTED . APPOINTMENT REQUIRED FOR FUTURE REFILLS 8 each 11   traMADol (ULTRAM) 50 MG tablet Take 2 tablets (100 mg total) by mouth at bedtime. (Patient taking differently: Take 50-100 mg by mouth at bedtime.) 90 tablet 1   zolpidem (AMBIEN) 10 MG tablet TAKE 1 TABLET BY MOUTH AT BEDTIME AS NEEDED FOR SLEEP (Patient taking differently: Take 10 mg by mouth at bedtime.) 90 tablet 0   fluticasone (FLOVENT HFA) 110 MCG/ACT inhaler Inhale 2 puffs into the lungs 2 (two) times a day. (Patient not taking: Reported on 07/19/2021) 1 Inhaler 12    No results found for this or any previous visit (from the past 48 hour(s)). No results found.  ROS: Pain with rom of the left lower extremity  Physical Exam: Alert and  oriented 81 y.o. female in no acute distress Cranial nerves 2-12 intact Cervical spine: full rom with no tenderness, nv intact distally Chest: active breath sounds bilaterally, no wheeze rhonchi or rales Heart: regular rate and rhythm, no murmur Abd: non tender non distended with active bowel sounds Hip is stable with rom  Left knee painful rom with crepitus Nv intact distally No rashes or edema distally  Assessment/Plan Assessment: left knee end stage osteoarthritis  Plan:  Patient will undergo a left total knee by Dr. [name] at [place] Risks benefits and expectations were discussed with the patient. Patient  understand risks, benefits and expectations and wishes to proceed. Preoperative templating of the joint replacement has been completed, documented, and submitted to the Operating Room personnel in order to optimize intra-operative equipment management.   Merla Riches PA-C, [facility] is now [employer] [address] [place], [place], [place] [address] Phone: [phone] [email or web address]   [employer]

## 2021-08-05 NOTE — Anesthesia Preprocedure Evaluation (Addendum)
Anesthesia Evaluation  Patient identified by MRN, date of birth, ID band Patient awake    Reviewed: Allergy & Precautions, NPO status , Patient's Chart, lab work & pertinent test results  Airway Mallampati: II  TM Distance: >3 FB Neck ROM: Full    Dental no notable dental hx.    Pulmonary neg pulmonary ROS, sleep apnea and Continuous Positive Airway Pressure Ventilation , former smoker,  sarcoidosis   Pulmonary exam normal breath sounds clear to auscultation       Cardiovascular + CAD  Normal cardiovascular exam Rhythm:Regular Rate:Normal     Neuro/Psych  Headaches, negative neurological ROS  negative psych ROS   GI/Hepatic negative GI ROS, Neg liver ROS, GERD  ,  Endo/Other  negative endocrine ROSHypothyroidism   Renal/GU negative Renal ROS  negative genitourinary   Musculoskeletal negative musculoskeletal ROS (+) Arthritis ,   Abdominal   Peds negative pediatric ROS (+)  Hematology negative hematology ROS (+)   Anesthesia Other Findings   Reproductive/Obstetrics negative OB ROS                           Anesthesia Physical Anesthesia Plan  ASA: 3  Anesthesia Plan: MAC, Spinal and Regional   Post-op Pain Management: Minimal or no pain anticipated and Tylenol PO (pre-op)   Induction: Intravenous  PONV Risk Score and Plan: 2 and Treatment may vary due to age or medical condition, Propofol infusion, TIVA and Ondansetron  Airway Management Planned: Natural Airway and Simple Face Mask  Additional Equipment: None  Intra-op Plan:   Post-operative Plan: Extubation in OR  Informed Consent: I have reviewed the patients History and Physical, chart, labs and discussed the procedure including the risks, benefits and alternatives for the proposed anesthesia with the patient or authorized representative who has indicated his/her understanding and acceptance.     Dental advisory  given  Plan Discussed with: CRNA and Anesthesiologist  Anesthesia Plan Comments: (Adductor canal block for pain control. Spinal. GA/LMA as backup.  Of note, patient had GA/LMA for her knee replacement in 2015. Will discuss with patient. Norton Blizzard, MD   Patient is OK to do spinal. GA/LMA as backup. Norton Blizzard, MD  )       Anesthesia Quick Evaluation

## 2021-08-06 ENCOUNTER — Ambulatory Visit (HOSPITAL_COMMUNITY): Payer: Medicare Other | Admitting: Certified Registered Nurse Anesthetist

## 2021-08-06 ENCOUNTER — Ambulatory Visit (HOSPITAL_COMMUNITY)
Admission: RE | Admit: 2021-08-06 | Discharge: 2021-08-06 | Disposition: A | Discharge status: Home / Self Care | Payer: Medicare Other | Source: Ambulatory Visit | Attending: Orthopedic Surgery | Admitting: Orthopedic Surgery

## 2021-08-06 ENCOUNTER — Encounter (HOSPITAL_COMMUNITY)
Admission: RE | Disposition: A | Discharge status: Home / Self Care | Payer: Self-pay | Source: Ambulatory Visit | Attending: Orthopedic Surgery

## 2021-08-06 ENCOUNTER — Encounter (HOSPITAL_COMMUNITY): Payer: Self-pay | Admitting: Orthopedic Surgery

## 2021-08-06 DIAGNOSIS — M21062 Valgus deformity, not elsewhere classified, left knee: Secondary | ICD-10-CM | POA: Diagnosis not present

## 2021-08-06 DIAGNOSIS — G4730 Sleep apnea, unspecified: Secondary | ICD-10-CM | POA: Insufficient documentation

## 2021-08-06 DIAGNOSIS — Z01818 Encounter for other preprocedural examination: Secondary | ICD-10-CM

## 2021-08-06 DIAGNOSIS — K219 Gastro-esophageal reflux disease without esophagitis: Secondary | ICD-10-CM | POA: Insufficient documentation

## 2021-08-06 DIAGNOSIS — Z20822 Contact with and (suspected) exposure to covid-19: Secondary | ICD-10-CM | POA: Insufficient documentation

## 2021-08-06 DIAGNOSIS — E039 Hypothyroidism, unspecified: Secondary | ICD-10-CM | POA: Insufficient documentation

## 2021-08-06 DIAGNOSIS — M1712 Unilateral primary osteoarthritis, left knee: Secondary | ICD-10-CM | POA: Diagnosis not present

## 2021-08-06 DIAGNOSIS — M170 Bilateral primary osteoarthritis of knee: Secondary | ICD-10-CM | POA: Diagnosis not present

## 2021-08-06 DIAGNOSIS — I2510 Atherosclerotic heart disease of native coronary artery without angina pectoris: Secondary | ICD-10-CM | POA: Insufficient documentation

## 2021-08-06 DIAGNOSIS — G8918 Other acute postprocedural pain: Secondary | ICD-10-CM | POA: Diagnosis not present

## 2021-08-06 HISTORY — PX: TOTAL KNEE ARTHROPLASTY: SHX125

## 2021-08-06 LAB — SARS CORONAVIRUS 2 BY RT PCR (HOSPITAL ORDER, PERFORMED IN ~~LOC~~ HOSPITAL LAB): SARS Coronavirus 2: NEGATIVE

## 2021-08-06 SURGERY — ARTHROPLASTY, KNEE, TOTAL
Anesthesia: Monitor Anesthesia Care | Site: Knee | Laterality: Left

## 2021-08-06 MED ORDER — HYDROCODONE-ACETAMINOPHEN 5-325 MG PO TABS
ORAL_TABLET | ORAL | Status: AC
  Filled 2021-08-06: qty 2

## 2021-08-06 MED ORDER — BUPIVACAINE IN DEXTROSE 0.75-8.25 % IT SOLN
INTRATHECAL | Status: DC | PRN
  Administered 2021-08-06: 1.6 mL via INTRATHECAL

## 2021-08-06 MED ORDER — OXYCODONE HCL 5 MG PO TABS
ORAL_TABLET | ORAL | Status: AC
  Filled 2021-08-06: qty 1

## 2021-08-06 MED ORDER — BUPIVACAINE LIPOSOME 1.3 % IJ SUSP
INTRAMUSCULAR | Status: DC | PRN
  Administered 2021-08-06: 20 mL

## 2021-08-06 MED ORDER — LACTATED RINGERS IV SOLN: INTRAVENOUS | Status: DC

## 2021-08-06 MED ORDER — ONDANSETRON HCL 4 MG/2ML IJ SOLN
INTRAMUSCULAR | Status: AC
  Filled 2021-08-06: qty 2

## 2021-08-06 MED ORDER — ONDANSETRON HCL 4 MG PO TABS: 4.0000 mg | ORAL_TABLET | Freq: Every day | ORAL | 1 refills | Status: DC | PRN

## 2021-08-06 MED ORDER — METHOCARBAMOL 500 MG PO TABS: 500.0000 mg | ORAL_TABLET | Freq: Four times a day (QID) | ORAL | Status: DC | PRN

## 2021-08-06 MED ORDER — AMISULPRIDE (ANTIEMETIC) 5 MG/2ML IV SOLN
10.0000 mg | Freq: Once | INTRAVENOUS | Status: AC | PRN
  Administered 2021-08-06: 10 mg via INTRAVENOUS

## 2021-08-06 MED ORDER — BUPIVACAINE LIPOSOME 1.3 % IJ SUSP
INTRAMUSCULAR | Status: AC
  Filled 2021-08-06: qty 40

## 2021-08-06 MED ORDER — ASPIRIN 81 MG PO TABS: 81.0000 mg | ORAL_TABLET | Freq: Two times a day (BID) | ORAL | 0 refills | Status: AC

## 2021-08-06 MED ORDER — BUPIVACAINE-EPINEPHRINE (PF) 0.25% -1:200000 IJ SOLN
INTRAMUSCULAR | Status: AC
  Filled 2021-08-06: qty 60

## 2021-08-06 MED ORDER — TRAMADOL HCL 50 MG PO TABS
50.0000 mg | ORAL_TABLET | Freq: Four times a day (QID) | ORAL | 0 refills | Status: DC | PRN
Start: 2021-08-06 — End: 2024-06-19

## 2021-08-06 MED ORDER — LACTATED RINGERS IV BOLUS
500.0000 mL | Freq: Once | INTRAVENOUS | Status: AC
  Administered 2021-08-06: 500 mL via INTRAVENOUS

## 2021-08-06 MED ORDER — BUPIVACAINE-EPINEPHRINE 0.25% -1:200000 IJ SOLN
INTRAMUSCULAR | Status: DC | PRN
  Administered 2021-08-06: 30 mL

## 2021-08-06 MED ORDER — AMISULPRIDE (ANTIEMETIC) 5 MG/2ML IV SOLN
INTRAVENOUS | Status: AC
  Filled 2021-08-06: qty 4

## 2021-08-06 MED ORDER — FENTANYL CITRATE (PF) 100 MCG/2ML IJ SOLN
INTRAMUSCULAR | Status: AC
  Filled 2021-08-06: qty 2

## 2021-08-06 MED ORDER — PROPOFOL 10 MG/ML IV BOLUS
INTRAVENOUS | Status: DC | PRN
  Administered 2021-08-06 (×2): 10 mg via INTRAVENOUS

## 2021-08-06 MED ORDER — POVIDONE-IODINE 10 % EX SWAB
2.0000 "application " | Freq: Once | CUTANEOUS | Status: AC
  Administered 2021-08-06: 2 via TOPICAL

## 2021-08-06 MED ORDER — SODIUM CHLORIDE 0.9 % IR SOLN
Status: DC | PRN
  Administered 2021-08-06: 1000 mL

## 2021-08-06 MED ORDER — METHOCARBAMOL 500 MG IVPB - SIMPLE MED
500.0000 mg | Freq: Four times a day (QID) | INTRAVENOUS | Status: DC | PRN
  Administered 2021-08-06: 500 mg via INTRAVENOUS

## 2021-08-06 MED ORDER — CEFAZOLIN SODIUM-DEXTROSE 2-4 GM/100ML-% IV SOLN
2.0000 g | INTRAVENOUS | Status: AC
  Administered 2021-08-06: 2 g via INTRAVENOUS
  Filled 2021-08-06: qty 100

## 2021-08-06 MED ORDER — ONDANSETRON HCL 4 MG/2ML IJ SOLN
INTRAMUSCULAR | Status: DC | PRN
  Administered 2021-08-06: 4 mg via INTRAVENOUS

## 2021-08-06 MED ORDER — METHOCARBAMOL 500 MG IVPB - SIMPLE MED
INTRAVENOUS | Status: AC
  Filled 2021-08-06: qty 50

## 2021-08-06 MED ORDER — ONDANSETRON HCL 4 MG/2ML IJ SOLN: 4.0000 mg | Freq: Once | INTRAMUSCULAR | Status: DC | PRN

## 2021-08-06 MED ORDER — FENTANYL CITRATE (PF) 100 MCG/2ML IJ SOLN
INTRAMUSCULAR | Status: DC | PRN
  Administered 2021-08-06: 50 ug via INTRAVENOUS

## 2021-08-06 MED ORDER — OXYCODONE HCL 5 MG PO TABS
5.0000 mg | ORAL_TABLET | Freq: Once | ORAL | Status: AC | PRN
  Administered 2021-08-06: 5 mg via ORAL

## 2021-08-06 MED ORDER — WATER FOR IRRIGATION, STERILE IR SOLN
Status: DC | PRN
  Administered 2021-08-06 (×2): 1000 mL

## 2021-08-06 MED ORDER — PROPOFOL 1000 MG/100ML IV EMUL
INTRAVENOUS | Status: AC
  Filled 2021-08-06: qty 100

## 2021-08-06 MED ORDER — CHLORHEXIDINE GLUCONATE 0.12 % MT SOLN
15.0000 mL | Freq: Once | OROMUCOSAL | Status: AC
  Administered 2021-08-06: 15 mL via OROMUCOSAL

## 2021-08-06 MED ORDER — BUPIVACAINE HCL (PF) 0.5 % IJ SOLN
INTRAMUSCULAR | Status: DC | PRN
  Administered 2021-08-06: 20 mL

## 2021-08-06 MED ORDER — ACETAMINOPHEN 500 MG PO TABS
1000.0000 mg | ORAL_TABLET | Freq: Once | ORAL | Status: AC
  Administered 2021-08-06: 1000 mg via ORAL
  Filled 2021-08-06: qty 2

## 2021-08-06 MED ORDER — PHENYLEPHRINE HCL-NACL 20-0.9 MG/250ML-% IV SOLN
INTRAVENOUS | Status: DC | PRN
  Administered 2021-08-06: 35 ug/min via INTRAVENOUS

## 2021-08-06 MED ORDER — SODIUM CHLORIDE (PF) 0.9 % IJ SOLN
INTRAMUSCULAR | Status: AC
  Filled 2021-08-06: qty 60

## 2021-08-06 MED ORDER — BUPIVACAINE LIPOSOME 1.3 % IJ SUSP: 20.0000 mL | Freq: Once | INTRAMUSCULAR | Status: DC

## 2021-08-06 MED ORDER — HYDROCODONE-ACETAMINOPHEN 5-325 MG PO TABS
1.0000 | ORAL_TABLET | ORAL | Status: DC | PRN
  Administered 2021-08-06: 2 via ORAL

## 2021-08-06 MED ORDER — TRANEXAMIC ACID-NACL 1000-0.7 MG/100ML-% IV SOLN
1000.0000 mg | INTRAVENOUS | Status: DC
  Filled 2021-08-06: qty 100

## 2021-08-06 MED ORDER — FENTANYL CITRATE PF 50 MCG/ML IJ SOSY: 25.0000 ug | PREFILLED_SYRINGE | INTRAMUSCULAR | Status: DC | PRN

## 2021-08-06 MED ORDER — 0.9 % SODIUM CHLORIDE (POUR BTL) OPTIME
TOPICAL | Status: DC | PRN
  Administered 2021-08-06: 1000 mL

## 2021-08-06 MED ORDER — SODIUM CHLORIDE (PF) 0.9 % IJ SOLN
INTRAMUSCULAR | Status: DC | PRN
  Administered 2021-08-06: 30 mL

## 2021-08-06 MED ORDER — PHENYLEPHRINE 40 MCG/ML (10ML) SYRINGE FOR IV PUSH (FOR BLOOD PRESSURE SUPPORT)
PREFILLED_SYRINGE | INTRAVENOUS | Status: DC | PRN
  Administered 2021-08-06: 80 ug via INTRAVENOUS

## 2021-08-06 MED ORDER — OXYCODONE HCL 5 MG/5ML PO SOLN: 5.0000 mg | Freq: Once | ORAL | Status: AC | PRN

## 2021-08-06 MED ORDER — ORAL CARE MOUTH RINSE: 15.0000 mL | Freq: Once | OROMUCOSAL | Status: AC

## 2021-08-06 MED ORDER — PROPOFOL 500 MG/50ML IV EMUL
INTRAVENOUS | Status: DC | PRN
  Administered 2021-08-06: 75 ug/kg/min via INTRAVENOUS

## 2021-08-06 MED ORDER — METHOCARBAMOL 500 MG PO TABS: 500.0000 mg | ORAL_TABLET | Freq: Three times a day (TID) | ORAL | 1 refills | Status: DC | PRN

## 2021-08-06 SURGICAL SUPPLY — 54 items
ATTUNE MED DOME PAT 38 KNEE (Knees) ×1 IMPLANT
ATTUNE PS FEM LT SZ 5 CEM KNEE (Femur) ×1 IMPLANT
BAG COUNTER SPONGE SURGICOUNT (BAG) IMPLANT
BAG SPEC THK2 15X12 ZIP CLS (MISCELLANEOUS)
BAG SPNG CNTER NS LX DISP (BAG)
BAG ZIPLOCK 12X15 (MISCELLANEOUS) IMPLANT
BASE TIBIAL ROT PLAT SZ 5 KNEE (Knees) IMPLANT
BLADE SAG 18X100X1.27 (BLADE) ×3 IMPLANT
BLADE SAW SGTL 13X75X1.27 (BLADE) ×3 IMPLANT
BNDG CMPR MED 10X6 ELC LF (GAUZE/BANDAGES/DRESSINGS) ×1
BNDG ELASTIC 6X10 VLCR STRL LF (GAUZE/BANDAGES/DRESSINGS) ×3 IMPLANT
BNDG GAUZE ELAST 4 BULKY (GAUZE/BANDAGES/DRESSINGS) ×3 IMPLANT
BOWL SMART MIX CTS (DISPOSABLE) ×4 IMPLANT
BSPLAT TIB 5 CMNT ROT PLAT STR (Knees) ×1 IMPLANT
CEMENT HV SMART SET (Cement) ×6 IMPLANT
COVER SURGICAL LIGHT HANDLE (MISCELLANEOUS) ×3 IMPLANT
CUFF TOURN SGL QUICK 34 (TOURNIQUET CUFF) ×2
CUFF TRNQT CYL 34X4.125X (TOURNIQUET CUFF) ×2 IMPLANT
DRAPE INCISE IOBAN 66X45 STRL (DRAPES) ×3 IMPLANT
DRAPE SHEET LG 3/4 BI-LAMINATE (DRAPES) ×3 IMPLANT
DRAPE U-SHAPE 47X51 STRL (DRAPES) ×3 IMPLANT
DRSG ADAPTIC 3X8 NADH LF (GAUZE/BANDAGES/DRESSINGS) ×3 IMPLANT
DRSG PAD ABDOMINAL 8X10 ST (GAUZE/BANDAGES/DRESSINGS) ×3 IMPLANT
DURAPREP 26ML APPLICATOR (WOUND CARE) ×3 IMPLANT
ELECT REM PT RETURN 15FT ADLT (MISCELLANEOUS) ×3 IMPLANT
GAUZE SPONGE 4X4 12PLY STRL (GAUZE/BANDAGES/DRESSINGS) ×3 IMPLANT
GLOVE SURG ORTHO LTX SZ7.5 (GLOVE) ×3 IMPLANT
GLOVE SURG ORTHO LTX SZ8.5 (GLOVE) ×3 IMPLANT
GLOVE SURG UNDER POLY LF SZ7.5 (GLOVE) ×3 IMPLANT
GLOVE SURG UNDER POLY LF SZ8.5 (GLOVE) ×3 IMPLANT
GOWN STRL REUS W/TWL XL LVL3 (GOWN DISPOSABLE) ×6 IMPLANT
HANDPIECE INTERPULSE COAX TIP (DISPOSABLE) ×2
HOLDER FOLEY CATH W/STRAP (MISCELLANEOUS) IMPLANT
IMMOBILIZER KNEE 20 (SOFTGOODS) ×2
IMMOBILIZER KNEE 20 THIGH 36 (SOFTGOODS) IMPLANT
INSERT TIBIAL KNEE SZ 5 12MM (Insert) IMPLANT
KIT TURNOVER KIT A (KITS) IMPLANT
MANIFOLD NEPTUNE II (INSTRUMENTS) ×3 IMPLANT
NS IRRIG 1000ML POUR BTL (IV SOLUTION) ×3 IMPLANT
PACK TOTAL KNEE CUSTOM (KITS) ×3 IMPLANT
PROTECTOR NERVE ULNAR (MISCELLANEOUS) ×3 IMPLANT
SET HNDPC FAN SPRY TIP SCT (DISPOSABLE) ×2 IMPLANT
STAPLER VISISTAT 35W (STAPLE) IMPLANT
STRIP CLOSURE SKIN 1/2X4 (GAUZE/BANDAGES/DRESSINGS) ×6 IMPLANT
SUT MNCRL AB 3-0 PS2 18 (SUTURE) ×3 IMPLANT
SUT VIC AB 0 CT1 36 (SUTURE) ×3 IMPLANT
SUT VIC AB 1 CT1 36 (SUTURE) ×9 IMPLANT
SUT VIC AB 2-0 CT1 27 (SUTURE) ×2
SUT VIC AB 2-0 CT1 TAPERPNT 27 (SUTURE) ×2 IMPLANT
TIBIAL BASE ROT PLAT SZ 5 KNEE (Knees) ×2 IMPLANT
TIBIAL INSERT KNEE SZ 5 12MM (Insert) ×2 IMPLANT
TRAY FOLEY MTR SLVR 16FR STAT (SET/KITS/TRAYS/PACK) ×3 IMPLANT
WATER STERILE IRR 1000ML POUR (IV SOLUTION) ×6 IMPLANT
WRAP KNEE MAXI GEL POST OP (GAUZE/BANDAGES/DRESSINGS) ×1 IMPLANT

## 2021-08-06 NOTE — Anesthesia Procedure Notes (Signed)
Spinal  Patient location during procedure: OR Start time: 08/06/2021 7:35 AM End time: 08/06/2021 7:40 AM Reason for block: surgical anesthesia Staffing Performed: anesthesiologist  Anesthesiologist: Merlinda Frederick, MD Preanesthetic Checklist Completed: patient identified, IV checked, risks and benefits discussed, surgical consent, monitors and equipment checked, pre-op evaluation and timeout performed Spinal Block Patient position: sitting Prep: DuraPrep Patient monitoring: cardiac monitor, continuous pulse ox and blood pressure Approach: midline Location: L3-4 Injection technique: single-shot Needle Needle type: Pencan  Needle gauge: 24 G Needle length: 9 cm Assessment Events: CSF return Additional Notes Functioning IV was confirmed and monitors were applied. Sterile prep and drape, including hand hygiene and sterile gloves were used. The patient was positioned and the spine was prepped. The skin was anesthetized with lidocaine.  Free flow of clear CSF was obtained prior to injecting local anesthetic into the CSF.  The spinal needle aspirated freely following injection.  The needle was carefully withdrawn.  The patient tolerated the procedure well.

## 2021-08-06 NOTE — Evaluation (Signed)
Physical Therapy Evaluation Patient Details Name: Elizabeth Gray MRN: [id number] DOB: 1940-08-02 Today's Date: 08/06/2021  History of Present Illness  Pt is an 81 year old female s/p Left TKA on 08/06/21.  Hx of Rt TKA 2015  Clinical Impression  Patient evaluated by Physical Therapy in PACU. All education has been completed and the patient has no further questions.  Pt ambulated in hallway and practiced safe stair technique.  Pt also performed LE exercises and provided with HEP handout.  Pt reports she will have her cousin assist her home and has her neighbor staying with her a few days upon d/c for assist if needed.  Pt had no further questions and feels ready for d/c home today.  Pt plans to f/u with OPPT on Monday. See below for any follow-up Physical Therapy or equipment needs. PT is signing off. Thank you for this referral.        Recommendations for follow up therapy are one component of a multi-disciplinary discharge planning process, led by the attending physician.  Recommendations may be updated based on patient status, additional functional criteria and insurance authorization.  Follow Up Recommendations Follow physician's recommendations for discharge plan and follow up therapies    Assistance Recommended at Discharge Set up Supervision/Assistance  Patient can return home with the following  Help with stairs or ramp for entrance;Assistance with cooking/housework    Equipment Recommendations Rolling walker (2 wheels)  Recommendations for Other Services       Functional Status Assessment       Precautions / Restrictions Precautions Precautions: Knee;Fall Precaution Comments: able to perform SLR Restrictions Weight Bearing Restrictions: No Other Position/Activity Restrictions: WBAT      Mobility  Bed Mobility Overal bed mobility: Needs Assistance Bed Mobility: Supine to Sit     Supine to sit: Supervision;HOB elevated          Transfers Overall transfer level:  Needs assistance Equipment used: Rolling walker (2 wheels) Transfers: Sit to/from Stand Sit to Stand: Min guard           General transfer comment: verbal cues for UE and LE positioning    Ambulation/Gait Ambulation/Gait assistance: Min guard Gait Distance (Feet): 100 Feet Assistive device: Rolling walker (2 wheels) Gait Pattern/deviations: Step-to pattern;Decreased stance time - left;Antalgic       General Gait Details: verbal cues for sequence, RW positioning, step length  Stairs Stairs: Yes Stairs assistance: Min guard Stair Management: Step to pattern;Forwards;One rail Left Number of Stairs: 2 General stair comments: pt has been performing this technique at home, cues for sequencing however no assist required, pt declined need for handout and feels capable of performing upon d/c.  will have cousin assist her home today  Wheelchair Mobility    Modified Rankin (Stroke Patients Only)       Balance                                             Pertinent Vitals/Pain Pain Assessment: 0-10 Pain Score: 3  Pain Location: left knee Pain Descriptors / Indicators: Sore Pain Intervention(s): Monitored during session;Repositioned    Home Living Family/patient expects to be discharged to:: Private residence Living Arrangements: Alone Available Help at Discharge: Neighbor;Available 24 hours/day;Family (for a few days) Type of Home: House Home Access: Stairs to enter Entrance Stairs-Rails: Right Entrance Stairs-Number of Steps: 2   Home Layout: One level  Home Equipment: BSC/3in1;Rollator (4 wheels)      Prior Function Prior Level of Function : Independent/Modified Independent                     Hand Dominance        Extremity/Trunk Assessment        Lower Extremity Assessment Lower Extremity Assessment: LLE deficits/detail LLE Deficits / Details: able to perform SLR and ankle pumps, good quad contraction       Communication    Communication: No difficulties  Cognition Arousal/Alertness: Awake/alert Behavior During Therapy: WFL for tasks assessed/performed Overall Cognitive Status: Within Functional Limits for tasks assessed                                          General Comments      Exercises Total Joint Exercises Ankle Circles/Pumps: AROM;Both;10 reps Quad Sets: AROM;Both;10 reps Heel Slides: AAROM;Left;10 reps Hip ABduction/ADduction: AROM;Left;10 reps Straight Leg Raises: AROM;Left;10 reps   Assessment/Plan    PT Assessment All further PT needs can be met in the next venue of care  PT Problem List Decreased strength;Decreased range of motion;Decreased mobility       PT Treatment Interventions      PT Goals (Current goals can be found in the Care Plan section)  Acute Rehab PT Goals PT Goal Formulation: All assessment and education complete, DC therapy    Frequency       Co-evaluation               AM-PAC PT "6 Clicks" Mobility  Outcome Measure Help needed turning from your back to your side while in a flat bed without using bedrails?: A Little Help needed moving from lying on your back to sitting on the side of a flat bed without using bedrails?: A Little Help needed moving to and from a bed to a chair (including a wheelchair)?: A Little Help needed standing up from a chair using your arms (e.g., wheelchair or bedside chair)?: A Little Help needed to walk in hospital room?: A Little Help needed climbing 3-5 steps with a railing? : A Little 6 Click Score: 18    End of Session Equipment Utilized During Treatment: Gait belt Activity Tolerance: Patient tolerated treatment well Patient left: in chair;with call bell/phone within reach Nurse Communication: Mobility status PT Visit Diagnosis: Difficulty in walking, not elsewhere classified (R26.2);Pain Pain - Right/Left: Left Pain - part of body: Knee    Time: 9249-3241 PT Time Calculation (min) (ACUTE ONLY): 28  min   Charges:   PT Evaluation $PT Eval Low Complexity: 1 Low PT Treatments $Gait Training: 8-22 mins      Jannette Spanner PT, DPT Acute Rehabilitation Services Pager: [phone] Office: [place] 08/06/2021, 2:55 PM

## 2021-08-06 NOTE — Discharge Instructions (Addendum)
Ice to the knee constantly.  Keep the incision covered and clean and dry for one week, then ok to get it wet in the shower. Bring your bandage for a change to therapy on Monday  Call Dr [name] at [phone] (cell) with any questions  Do exercise as instructed every hour, please to prevent stiffness.    DO NOT prop anything under the knee, it will make your knee stiff.  Prop under the ankle to encourage your knee to go straight.   Use the walker while you are up and around for balance.  Wear your support stockings 24/7 to prevent blood clots and take baby aspirin twice daily for 30 days also to prevent blood clots  Follow up with Dr [name] in two weeks in the office, call [phone] for appt   INSTRUCTIONS AFTER JOINT REPLACEMENT   Remove items at home which could result in a fall. This includes throw rugs or furniture in walking pathways ICE to the affected joint every three hours while awake for 30 minutes at a time, for at least the first 3-5 days, and then as needed for pain and swelling.  Continue to use ice for pain and swelling. You may notice swelling that will progress down to the foot and ankle.  This is normal after surgery.  Elevate your leg when you are not up walking on it.   Continue to use the breathing machine you got in the hospital (incentive spirometer) which will help keep your temperature down.  It is common for your temperature to cycle up and down following surgery, especially at night when you are not up moving around and exerting yourself.  The breathing machine keeps your lungs expanded and your temperature down.   DIET:  As you were doing prior to hospitalization, we recommend a well-balanced diet.  DRESSING / WOUND CARE / SHOWERING  You may change your dressing 3-5 days after surgery.  Then change the dressing every day with sterile gauze.  Please use good hand washing techniques before changing the dressing.  Do not use any lotions or creams on the incision  until instructed by your surgeon.  ACTIVITY  Increase activity slowly as tolerated, but follow the weight bearing instructions below.   No driving for 6 weeks or until further direction given by your physician.  You cannot drive while taking narcotics.  No lifting or carrying greater than 10 lbs. until further directed by your surgeon. Avoid periods of inactivity such as sitting longer than an hour when not asleep. This helps prevent blood clots.  You may return to work once you are authorized by your doctor.     WEIGHT BEARING   Weight bearing as tolerated with assist device (walker, cane, etc) as directed, use it as long as suggested by your surgeon or therapist, typically at least 4-6 weeks.   EXERCISES  Results after joint replacement surgery are often greatly improved when you follow the exercise, range of motion and muscle strengthening exercises prescribed by your doctor. Safety measures are also important to protect the joint from further injury. Any time any of these exercises cause you to have increased pain or swelling, decrease what you are doing until you are comfortable again and then slowly increase them. If you have problems or questions, call your caregiver or physical therapist for advice.   Rehabilitation is important following a joint replacement. After just a few days of immobilization, the muscles of the leg can become weakened and shrink (  atrophy).  These exercises are designed to build up the tone and strength of the thigh and leg muscles and to improve motion. Often times heat used for twenty to thirty minutes before working out will loosen up your tissues and help with improving the range of motion but do not use heat for the first two weeks following surgery (sometimes heat can increase post-operative swelling).   These exercises can be done on a training (exercise) mat, on the floor, on a table or on a bed. Use whatever works the best and is most comfortable for  you.    Use music or television while you are exercising so that the exercises are a pleasant break in your day. This will make your life better with the exercises acting as a break in your routine that you can look forward to.   Perform all exercises about fifteen times, three times per day or as directed.  You should exercise both the operative leg and the other leg as well.  Exercises include:   Quad Sets - Tighten up the muscle on the front of the thigh (Quad) and hold for 5-10 seconds.   Straight Leg Raises - With your knee straight (if you were given a brace, keep it on), lift the leg to 60 degrees, hold for 3 seconds, and slowly lower the leg.  Perform this exercise against resistance later as your leg gets stronger.  Leg Slides: Lying on your back, slowly slide your foot toward your buttocks, bending your knee up off the floor (only go as far as is comfortable). Then slowly slide your foot back down until your leg is flat on the floor again.  Angel Wings: Lying on your back spread your legs to the side as far apart as you can without causing discomfort.  Hamstring Strength:  Lying on your back, push your heel against the floor with your leg straight by tightening up the muscles of your buttocks.  Repeat, but this time bend your knee to a comfortable angle, and push your heel against the floor.  You may put a pillow under the heel to make it more comfortable if necessary.   A rehabilitation program following joint replacement surgery can speed recovery and prevent re-injury in the future due to weakened muscles. Contact your doctor or a physical therapist for more information on knee rehabilitation.    CONSTIPATION  Constipation is defined medically as fewer than three stools per week and severe constipation as less than one stool per week.  Even if you have a regular bowel pattern at home, your normal regimen is likely to be disrupted due to multiple reasons following surgery.  Combination of  anesthesia, postoperative narcotics, change in appetite and fluid intake all can affect your bowels.   YOU MUST use at least one of the following options; they are listed in order of increasing strength to get the job done.  They are all available over the counter, and you may need to use some, POSSIBLY even all of these options:    Drink plenty of fluids (prune juice may be helpful) and high fiber foods Colace 100 mg by mouth twice a day  Senokot for constipation as directed and as needed Dulcolax (bisacodyl), take with full glass of water  Miralax (polyethylene glycol) once or twice a day as needed.  If you have tried all these things and are unable to have a bowel movement in the first 3-4 days after surgery call either your surgeon or  your primary doctor.    If you experience loose stools or diarrhea, hold the medications until you stool forms back up.  If your symptoms do not get better within 1 week or if they get worse, check with your doctor.  If you experience "the worst abdominal pain ever" or develop nausea or vomiting, please contact the office immediately for further recommendations for treatment.   ITCHING:  If you experience itching with your medications, try taking only a single pain pill, or even half a pain pill at a time.  You can also use Benadryl over the counter for itching or also to help with sleep.   TED HOSE STOCKINGS:  Use stockings on both legs until for at least 2 weeks or as directed by physician office. They may be removed at night for sleeping.  MEDICATIONS:  See your medication summary on the After Visit Summary that nursing will review with you.  You may have some home medications which will be placed on hold until you complete the course of blood thinner medication.  It is important for you to complete the blood thinner medication as prescribed.  PRECAUTIONS:  If you experience chest pain or shortness of breath - call 911 immediately for transfer to the  hospital emergency department.   If you develop a fever greater that 101 F, purulent drainage from wound, increased redness or drainage from wound, foul odor from the wound/dressing, or calf pain - CONTACT YOUR SURGEON.                                                   FOLLOW-UP APPOINTMENTS:  If you do not already have a post-op appointment, please call the office for an appointment to be seen by your surgeon.  Guidelines for how soon to be seen are listed in your After Visit Summary, but are typically between 1-4 weeks after surgery.  OTHER INSTRUCTIONS:   Knee Replacement:  Do not place pillow under knee, focus on keeping the knee straight while resting. CPM instructions: 0-90 degrees, 2 hours in the morning, 2 hours in the afternoon, and 2 hours in the evening. Place foam block, curve side up under heel at all times except when in CPM or when walking.  DO NOT modify, tear, cut, or change the foam block in any way.  POST-OPERATIVE OPIOID TAPER INSTRUCTIONS: It is important to wean off of your opioid medication as soon as possible. If you do not need pain medication after your surgery it is ok to stop day one. Opioids include: Codeine, Hydrocodone(Norco, Vicodin), Oxycodone(Percocet, oxycontin) and hydromorphone amongst others.  Long term and even short term use of opiods can cause: Increased pain response Dependence Constipation Depression Respiratory depression And more.  Withdrawal symptoms can include Flu like symptoms Nausea, vomiting And more Techniques to manage these symptoms Hydrate well Eat regular healthy meals Stay active Use relaxation techniques(deep breathing, meditating, yoga) Do Not substitute Alcohol to help with tapering If you have been on opioids for less than two weeks and do not have pain than it is ok to stop all together.  Plan to wean off of opioids This plan should start within one week post op of your joint replacement. Maintain the same interval or  time between taking each dose and first decrease the dose.  Cut the total daily intake of opioids  by one tablet each day Next start to increase the time between doses. The last dose that should be eliminated is the evening dose.   MAKE SURE YOU:  Understand these instructions.  Get help right away if you are not doing well or get worse.    Thank you for letting us be a part of your medical care team.  It is a privilege we respect greatly.  We hope these instructions will help you stay on track for a fast and full recovery!

## 2021-08-06 NOTE — Brief Op Note (Signed)
08/06/2021  9:43 AM  PATIENT:  Elizabeth Gray  81 y.o. female  PRE-OPERATIVE DIAGNOSIS:  left knee end stage OA  POST-OPERATIVE DIAGNOSIS:  same  PROCEDURE:  Procedure(s): TOTAL KNEE ARTHROPLASTY (Left)  SURGEON:  Surgeon(s) and Role:    [name], MD - Primary  PHYSICIAN ASSISTANT:   ASSISTANTS: Ventura Bruns, PA-C   ANESTHESIA:   regional and spinal  EBL:  minimal  BLOOD ADMINISTERED:none  DRAINS: none   LOCAL MEDICATIONS USED:  MARCAINE     SPECIMEN:  No Specimen  DISPOSITION OF SPECIMEN:  N/A  COUNTS:  YES  TOURNIQUET:   Total Tourniquet Time Documented: Thigh (Left) - 90 minutes Total: Thigh (Left) - 90 minutes   DICTATION: .Other Dictation: Dictation Number [id number]  PLAN OF CARE: Discharge to home after PACU  PATIENT DISPOSITION:  PACU - hemodynamically stable.   Delay start of Pharmacological VTE agent (>24hrs) due to surgical blood loss or risk of bleeding: no

## 2021-08-06 NOTE — Anesthesia Postprocedure Evaluation (Signed)
Anesthesia Post Note  Patient: Elizabeth Gray  Procedure(s) Performed: TOTAL KNEE ARTHROPLASTY (Left: Knee)     Patient location during evaluation: PACU Anesthesia Type: Regional and General Level of consciousness: awake Pain management: pain level controlled Vital Signs Assessment: post-procedure vital signs reviewed and stable Respiratory status: spontaneous breathing and respiratory function stable Cardiovascular status: stable Postop Assessment: no apparent nausea or vomiting Anesthetic complications: no   No notable events documented.  Last Vitals:  Vitals:   08/06/21 1044 08/06/21 1115  BP: 116/64 123/60  Pulse: 68 63  Resp: 12 15  Temp: 36.4 C   SpO2: 95% 95%    Last Pain:  Vitals:   08/06/21 1115  TempSrc:   PainSc: 0-No pain                 Merlinda Frederick

## 2021-08-06 NOTE — Transfer of Care (Signed)
Immediate Anesthesia Transfer of Care Note  Patient: Elizabeth Gray  Procedure(s) Performed: TOTAL KNEE ARTHROPLASTY (Left: Knee)  Patient Location: PACU  Anesthesia Type:Spinal  Level of Consciousness: awake, alert  and oriented  Airway & Oxygen Therapy: Patient Spontanous Breathing and Patient connected to face mask oxygen  Post-op Assessment: Report given to RN and Post -op Vital signs reviewed and stable  Post vital signs: Reviewed and stable  Last Vitals:  Vitals Value Taken Time  BP    Temp    Pulse 68 08/06/21 0948  Resp 20 08/06/21 0948  SpO2 100 % 08/06/21 0948  Vitals shown include unvalidated device data.  Last Pain:  Vitals:   08/06/21 0554  TempSrc: Oral  PainSc:          Complications: No notable events documented.

## 2021-08-06 NOTE — Care Plan (Signed)
Ortho Bundle Case Management Note  Patient Details  Name: Elizabeth Gray MRN: [id number] Date of Birth: 03-31-1941                  L TKA on 08-06-21 DCP: Home with neighbor. DME: RW ordered through [place] PT: [facility] on 08-09-21.   DME Arranged:  [facility]ford Rile rolling DME Agency:  [employer]  HH Arranged:    [place] Agency:     Additional Comments: Please contact me with any questions of if this plan should need to change.  Marianne Sofia, RN,CCM [facility]  [phone] 08/06/2021, 10:31 AM

## 2021-08-06 NOTE — Anesthesia Procedure Notes (Signed)
Procedure Name: MAC Date/Time: 08/06/2021 7:41 AM Performed by: Maxwell Caul, CRNA Pre-anesthesia Checklist: Patient identified, Emergency Drugs available, Suction available and Patient being monitored Oxygen Delivery Method: Simple face mask

## 2021-08-06 NOTE — Anesthesia Procedure Notes (Signed)
Anesthesia Regional Block: Adductor canal block   Pre-Anesthetic Checklist: , timeout performed,  Correct Patient, Correct Site, Correct Laterality,  Correct Procedure, Correct Position, site marked,  Risks and benefits discussed,  Surgical consent,  Pre-op evaluation,  At surgeon's request and post-op pain management  Laterality: Left  Prep: chloraprep       Needles:  Injection technique: Single-shot  Needle Type: Echogenic Stimulator Needle     Needle Length: 10cm  Needle Gauge: 20     Additional Needles:   Procedures:,,,, ultrasound used (permanent image in chart),,    Narrative:  Start time: 08/06/2021 6:55 AM End time: 08/06/2021 7:00 AM Injection made incrementally with aspirations every 5 mL.  Performed by: Personally  Anesthesiologist: Merlinda Frederick, MD  Additional Notes: Functioning IV was confirmed and monitors were applied.  Sterile prep and drape,hand hygiene and sterile gloves were used. Ultrasound guidance: relevant anatomy identified, needle position confirmed, local anesthetic spread visualized around nerve(s)., vascular puncture avoided. Negative aspiration and negative test dose prior to incremental administration of local anesthetic. The patient tolerated the procedure well.

## 2021-08-06 NOTE — Interval H&P Note (Signed)
History and Physical Interval Note:  08/06/2021 7:38 AM  Elizabeth Gray  has presented today for surgery, with the diagnosis of left total knee arthroplasty.  The various methods of treatment have been discussed with the patient and family. After consideration of risks, benefits and other options for treatment, the patient has consented to  Procedure(s): TOTAL KNEE ARTHROPLASTY (Left) as a surgical intervention.  The patient's history has been reviewed, patient examined, no change in status, stable for surgery.  I have reviewed the patient's chart and labs.  Questions were answered to the patient's satisfaction.     [name]

## 2021-08-06 NOTE — Op Note (Signed)
Elizabeth Gray, Elizabeth Gray MEDICAL RECORD NO: [id number] ACCOUNT NO: [id number] DATE OF BIRTH: 08/13/1940 FACILITY: [facility] LOCATION: [facility] PHYSICIAN: Doran Heater. [name], MD  Operative Report   DATE OF PROCEDURE: 08/06/2021  PREOPERATIVE DIAGNOSIS:  Left knee end-stage osteoarthritis.  POSTOPERATIVE DIAGNOSIS:  Left knee end-stage osteoarthritis.  PROCEDURE PERFORMED:  Left total knee replacement using DePuy Attune prosthesis.  ATTENDING SURGEON:  Doran Heater. [name], MD  ASSISTANT:  Charletta Cousin [place], [place], who was scrubbed during the entire procedure and necessary for satisfactory completion of surgery.  ANESTHESIA:  Spinal anesthesia plus adductor canal block was used.  ESTIMATED BLOOD LOSS:  Minimal.  FLUID REPLACEMENT:  1500 mL crystalloid.  COUNTS:  Instrument counts were correct.   COMPLICATIONS:  No complications.   ANTIBIOTICS:  Perioperative antibiotics were given.  TOURNIQUET TIME:  1 hour and 20 minutes at 300 mmHg.  INDICATIONS:  The patient is an 81 year old female who presents with a history of worsening left knee pain secondary to end-stage arthritis with a valgus knee deformity.  The patient has had progressive pain despite conservative management over a period  of years. The patient has had a prior right total knee replacement and has done well with that.  She presents now having failed conservative treatment for left total knee arthroplasty to restore mechanical alignment and to restore function and eliminate  pain.  Informed consent was obtained.  DESCRIPTION OF PROCEDURE:  After an adequate level of spinal anesthesia was achieved, the patient was positioned supine on the operating room table.  Left leg was correctly identified. A nonsterile tourniquet was placed on the proximal thigh.  The  patient had a flexion contracture noted as well as valgus deformity.  After a sterile prep and drape of the left knee, timeout was called verifying correct patient, correct  site.  We then elevated the leg, exsanguinated with an Esmarch bandage, and then  inflated the tourniquet to 300 mmHg.  We placed the knee in flexion.  We then performed a longitudinal midline incision with a 10 blade scalpel.  Dissection down through subcutaneous tissues.  We then used a fresh 10 blade for the medial parapatellar  arthrotomy.  We everted the patella and divided lateral patellofemoral ligaments exposing the distal femur, which was devoid of cartilage.  We then entered the distal femur with a step cut drill.  We then placed our intramedullary guide and resected 11  mm off the distal femur set on 3 degrees of valgus for this valgus knee.  Once we had our distal femoral cut, we sized our femur to a size 5 anterior down.  Performed anterior, posterior, and chamfer cuts with the 4-in-1 block.  We then removed ACL, PCL,  meniscal tissue subluxing the tibia anteriorly.  We then performed a tibial cut perpendicular to the long axis of the tibia with minimal posterior slope for this posterior cruciate substituting prosthesis.  We took 2 mm off the affected lateral side.   Once we had that tibial cut performed, we used a lamina spreader and removed the posterior femoral condyles.  We checked our gaps, which were symmetric at 8 mm.  We felt like we could probably get either an 8 or 12 in.  After we had our posterior femoral  condyle spurs removed, we injected the posterior capsule with a combination of Marcaine, Exparel, and saline.  Next, we completed our tibial preparation for the size 5 tibia, externally rotating the component as much as possible for patellar tracking  purposes.  We  then used the modular drill and keel punch to complete our tibial preparation. Left the trial tibia in place.  We then cut our box using the box cutting guide for the 5 left femur.  We then placed our trial 5 femur on and drilled our lug  holes.  Next, we used a 10 spacer and we were able to get the knee to full  extension with good flexion stability.  We then resurfaced our patella going from 25 mm thickness down to 15 mm thickness using the oscillating saw and the patellar cutting guide.   We drilled the lug holes for the 38 patellar button.  We then ranged the knee with no-touch technique with excellent patellar tracking.  Removed all trial components.  We pulse irrigated the bone, dried it well, vacuum mixed high-viscosity cement on  the back table and cemented the size 5 left femur and the 5 tibia into place with a 10 spacer, placed the knee in extension while the cement hardened and also used a patellar clamp on the 38 patellar button that was cemented in place.  Once all cement  was hardened, we removed excess cement with 1/4-inch curved osteotome.  We then injected the anterior capsule and suprapatellar pouch with Marcaine, Exparel, saline combo. We then selected a size 12 mm spacer size 5 and placed on the tibial tray and  reduced the knee.  We had good stability in flexion and extension.  We did have the ability to get full extension.  We irrigated thoroughly and then closed the parapatellar arthrotomy with #1 Vicryl suture followed by 0 and 2-0 Vicryl subcutaneous  closure and 4-0 Monocryl for skin.  Steri-Strips were applied followed by sterile dressing.  The patient tolerated surgery well.   [facility] D: 08/06/2021 9:49:19 am T: 08/06/2021 11:22:00 am  JOB: [id number]/ [id number]

## 2021-08-09 ENCOUNTER — Ambulatory Visit: Payer: Medicare Other | Attending: Orthopedic Surgery | Admitting: Physical Therapy

## 2021-08-09 ENCOUNTER — Other Ambulatory Visit: Payer: Self-pay

## 2021-08-09 ENCOUNTER — Encounter (HOSPITAL_COMMUNITY): Payer: Self-pay | Admitting: Orthopedic Surgery

## 2021-08-09 DIAGNOSIS — R293 Abnormal posture: Secondary | ICD-10-CM | POA: Diagnosis not present

## 2021-08-09 DIAGNOSIS — G8929 Other chronic pain: Secondary | ICD-10-CM | POA: Insufficient documentation

## 2021-08-09 DIAGNOSIS — M25662 Stiffness of left knee, not elsewhere classified: Secondary | ICD-10-CM | POA: Diagnosis not present

## 2021-08-09 DIAGNOSIS — M25562 Pain in left knee: Secondary | ICD-10-CM | POA: Insufficient documentation

## 2021-08-09 NOTE — Therapy (Signed)
[facility] [place], [place], [address] Phone: [phone]   Fax:  [phone]  Physical Therapy Evaluation  Patient Details  Name: Elizabeth Gray MRN: [id number] Date of Birth: October 15, 1940 Referring Provider (PT): Esmond Plants MD   Encounter Date: 08/09/2021   PT End of Session - 08/09/21 1231     Visit Number 1    Number of Visits 12    Date for PT Re-Evaluation 09/06/21    Authorization Type FOTO AT LEAST EVERY 5TH VISIT.  PROGRESS NOTE AT 10TH VISIT.  KX MODIFIER AFTER 15 VISITS.    PT Start Time 0945    PT Stop Time 1029    PT Time Calculation (min) 44 min    Activity Tolerance Patient tolerated treatment well    Behavior During Therapy WFL for tasks assessed/performed             Past Medical History:  Diagnosis Date   Allergy    Bursitis    Cancer ([place]) 03/22/2017   Melanoma in situ, lentigo maligna type   Deviated septum    External hemorrhoids without mention of complication 2800   Colonoscopy    Fatigue    GERD (gastroesophageal reflux disease)    Headache(784.0)    migraines   Heat rash    under the breasts.Marland Kitchenappeared on monday.Marland KitchenMarland KitchenBurning & itching, uses cortisone   Hypothyroidism    Iron deficiency anemia, unspecified    Lower esophageal ring 2008   EGD   Nonorganic sleep disorder, unspecified    Osteoarthritis    Osteoporosis    Pancreatitis    Personal history of colonic polyps    Polio 1948   Pulmonary sarcoidosis ([place])    Sleep apnea    [facility]   Vitamin B12 deficiency    Vitamin D deficiency     Past Surgical History:  Procedure Laterality Date   [place]  2002   rt   CHOLECYSTECTOMY  2000   COLONOSCOPY  12/19/2006   Multiple diminutive polyps destroyed-removed (hyperpastic), internal hemorrhoids   ESOPHAGOGASTRODUODENOSCOPY  12/19/2006   lower esophageal ring dilated   HAND SURGERY  1997   left    KNEE ARTHROSCOPY Bilateral    multiple 2 on lft 1 on rt   [place]   Melanoma Removal  Right 2018   right face   TOE SURGERY Left    left foot next to little toe  joint rem   TOTAL KNEE ARTHROPLASTY Right 05/09/2014   Procedure: RIGHT TOTAL KNEE ARTHROPLASTY;  Surgeon: [name], MD;  Location: [facility];  Service: Orthopedics;  Laterality: Right;   TOTAL KNEE ARTHROPLASTY Left 08/06/2021   Procedure: TOTAL KNEE ARTHROPLASTY;  Surgeon: [name], MD;  Location: [facility];  Service: Orthopedics;  Laterality: Left;   UPPER GASTROINTESTINAL ENDOSCOPY     WISDOM TOOTH EXTRACTION      There were no vitals filed for this visit.    Subjective Assessment - 08/09/21 1233     Subjective COVID-19 screen performed prior to patient entering clinic.  The patient presents to the clinic today s/p left total knee replacement performed on 08/06/21.  She currently has her post-surgical dressing and ACE wrap donned but will be allowed to remove this and donn her TED hose in the next day of two.  Her pain at rest to day is a low 3/49 but can certainly rise to higher  levels with movement of her knee and increased walking.  She is walking safely with a FWW and is doing her HEP.  Pain medication is currently controlling her pain.    Pertinent History OP, OA, right TKA, hypothyroidism.    How long can you walk comfortably? Around home wiht walker.    Patient Stated Goals Perform ADL's without left knee pain.    Currently in Pain? Yes    Pain Score 2     Pain Location Knee    Pain Orientation Left    Pain Descriptors / Indicators Aching    Pain Type Surgical pain    Pain Onset In the past 7 days    Pain Frequency Constant    Aggravating Factors  See above.    Pain Relieving Factors See above.                [facility] PT Assessment - 08/09/21 0001       Assessment   Medical Diagnosis Left total knee replacement    Referring Provider (PT) Esmond Plants MD    Onset Date/Surgical Date  --   08/06/21 (surgery date).     Precautions   Precaution Comments No ultrasound.      Restrictions   Weight Bearing Restrictions No      Balance Screen   Has the patient fallen in the past 6 months No    Has the patient had a decrease in activity level because of a fear of falling?  No    Is the patient reluctant to leave their home because of a fear of falling?  No      Home Environment   Living Environment Private residence      Prior Function   Level of Independence Independent      Observation/Other Assessments   Focus on Therapeutic Outcomes (FOTO)  Complete.      ROM / Strength   AROM / PROM / Strength AROM;Strength      AROM   Overall AROM Comments In supine:  Active left knee extension to -15 degrees and passive to -10 degrees and flexion to 80 degrees.      Strength   Overall Strength Comments Left hip knee is 3/5, patient able to perform an antigravity left short arc quad.      Palpation   Palpation comment C/o diffuse left knee pain currently as expected.      Bed Mobility   Bed Mobility Sit to Supine;Supine to Sit    Supine to Sit Independent    Sit to Supine Independent      Ambulation/Gait   Gait Comments Step to gait pattern with a FWW.                        Objective measurements completed on examination: See above findings.       [facility] Adult PT Treatment/Exercise - 08/09/21 0001       Modalities   Modalities Vasopneumatic      Vasopneumatic   Number Minutes Vasopneumatic  15 minutes    Vasopnuematic Location  --   Left knee.   Vasopneumatic Pressure Low                          PT Long Term Goals - 08/09/21 1254       PT LONG TERM GOAL #1   Title independent with a HEP.    Time 4  Period Weeks      PT LONG TERM GOAL #2   Title Full active left knee extension in order to normalize gait.    Time 4    Period Weeks    Status New      PT LONG TERM GOAL #3   Title Active knee flexion to 115  degrees+ so the patient can perform functional tasks and do so with pain not > 2-3/10.    Time 4    Period Weeks    Status New      PT LONG TERM GOAL #4   Title Increase left hip and knee strength to 5/5 to provide good stability for accomplishment of functional activities    Time 4    Period Weeks    Status New      PT LONG TERM GOAL #5   Title Perform a reciprocating stair gait with one railing with pain not > 2-3/10.    Time 4    Period Weeks    Status New                    Plan - 08/09/21 1247     Clinical Impression Statement The patient presents to [facility]PPT s/p left total knee replacement performed 08/06/21.  She is walking with a FWW at this time and doing well.  She lacks some left knee extension and we discussed compliance to her HEP.  She will allowed to remove her post-surgical dressing in the next day or two.  Her left knee flexion is currently limited to 80 degrees.  She is able to perform an antigravity shor arc quad.  She was able to performing transitory movements (sit to supine to sit) independently.  Patient will benefit from skilled physical therapy intervention to address pain and deficits.    Personal Factors and Comorbidities Comorbidity 1;Comorbidity 2;Other    Comorbidities OP, OA, right TKA, hypothyroidism.    Examination-Activity Limitations Other;Locomotion Level;Stairs;Carry    Examination-Participation Restrictions Other    Stability/Clinical Decision Making Stable/Uncomplicated    Clinical Decision Making Low    Rehab Potential Excellent    PT Frequency 3x / week    PT Duration 4 weeks    PT Treatment/Interventions ADLs/Self Care Home Management;Cryotherapy;Electrical Stimulation;Moist Heat;Gait training;Stair training;Functional mobility training;Therapeutic activities;Therapeutic exercise;Neuromuscular re-education;Manual techniques;Patient/family education;Passive range of motion;Vasopneumatic Device    PT Next Visit Plan Nustep and progress to  recumbent bike.  PROM.  Advance HEP.  Vasopneumatic.  PRE's.    Consulted and Agree with Plan of Care Patient             Patient will benefit from skilled therapeutic intervention in order to improve the following deficits and impairments:  Abnormal gait, Decreased activity tolerance, Decreased range of motion, Decreased strength, Pain  Visit Diagnosis: Chronic pain of left knee - Plan: PT plan of care cert/re-cert  Stiffness of left knee, not elsewhere classified - Plan: PT plan of care cert/re-cert     Problem List Patient Active Problem List   Diagnosis Date Noted   Insomnia 09/23/2020   Dyslipidemia 09/23/2020   RLS (restless legs syndrome) 07/20/2020   Drug side effects, initial encounter 07/20/2020   Coronary atherosclerosis 03/18/2019   Post-traumatic headache, not intractable 12/04/2018   Grief 09/12/2018   Atrophic vaginitis 09/06/2017   Burning sensation of mouth 09/06/2017   Osteopenia 09/05/2016   Chest pain 03/04/2016   Degenerative arthritis of right knee 05/09/2014   Headache(784.0) 07/19/2013   Tinnitus 07/19/2013   Dry  skin dermatitis 08/31/2012   Left hip pain 08/31/2012   Cerumen impaction 08/31/2012   Well adult exam 05/25/2012   Upper abdominal pain-chronic 04/02/2011   Thumb pain 03/11/2011   Donor, blood 03/11/2011   Shoulder pain 03/11/2011   Bilateral primary osteoarthritis of knee 09/10/2010   VERTIGO 03/05/2010   OSA on CPAP 07/22/2008   B12 deficiency 08/16/2007   Vitamin D deficiency 08/16/2007   Nonorganic sleep disorder 05/10/2007   SARCOIDOSIS, PULMONARY 05/09/2007   Hypothyroidism 05/09/2007   ALLERGIC RHINITIS 05/09/2007   GERD 05/09/2007   Personal history of colon polyps 05/09/2007    Shizuko Wojdyla, [place], PT 08/09/2021, 1:00 PM  [facility] [address] [place], [place], [address] Phone: [phone]   Fax:  [phone]  Name: NORAH DEVIN MRN: [id number] Date of Birth:  05-Aug-1940

## 2021-08-11 ENCOUNTER — Other Ambulatory Visit: Payer: Self-pay

## 2021-08-11 ENCOUNTER — Ambulatory Visit: Payer: Medicare Other | Admitting: Physical Therapy

## 2021-08-11 ENCOUNTER — Encounter: Payer: Self-pay | Admitting: Physical Therapy

## 2021-08-11 DIAGNOSIS — R293 Abnormal posture: Secondary | ICD-10-CM | POA: Diagnosis not present

## 2021-08-11 DIAGNOSIS — G8929 Other chronic pain: Secondary | ICD-10-CM

## 2021-08-11 DIAGNOSIS — M25662 Stiffness of left knee, not elsewhere classified: Secondary | ICD-10-CM

## 2021-08-11 DIAGNOSIS — M25562 Pain in left knee: Secondary | ICD-10-CM | POA: Diagnosis not present

## 2021-08-11 NOTE — Therapy (Signed)
[facility] [place], [place], [address] Phone: [phone]   Fax:  [phone]  Physical Therapy Treatment  Patient Details  Name: Elizabeth Gray MRN: [id number] Date of Birth: 1941/04/28 Referring Provider (PT): Esmond Plants MD   Encounter Date: 08/11/2021   PT End of Session - 08/11/21 0838     Visit Number 2    Number of Visits 12    Date for PT Re-Evaluation 09/06/21    Authorization Type FOTO AT LEAST EVERY 5TH VISIT.  PROGRESS NOTE AT 10TH VISIT.  KX MODIFIER AFTER 15 VISITS.    PT Start Time 0825    PT Stop Time 0906    PT Time Calculation (min) 41 min    Equipment Utilized During Treatment Other (comment)   FWW   Activity Tolerance Patient tolerated treatment well    Behavior During Therapy WFL for tasks assessed/performed             Past Medical History:  Diagnosis Date   Allergy    Bursitis    Cancer ([place]) 03/22/2017   Melanoma in situ, lentigo maligna type   Deviated septum    External hemorrhoids without mention of complication 0932   Colonoscopy    Fatigue    GERD (gastroesophageal reflux disease)    Headache(784.0)    migraines   Heat rash    under the breasts.Marland Kitchenappeared on monday.Marland KitchenMarland KitchenBurning & itching, uses cortisone   Hypothyroidism    Iron deficiency anemia, unspecified    Lower esophageal ring 2008   EGD   Nonorganic sleep disorder, unspecified    Osteoarthritis    Osteoporosis    Pancreatitis    Personal history of colonic polyps    Polio 1948   Pulmonary sarcoidosis ([place])    Sleep apnea    [facility]ap [facility]   Vitamin B12 deficiency    Vitamin D deficiency     Past Surgical History:  Procedure Laterality Date   [place]  2002   rt   CHOLECYSTECTOMY  2000   COLONOSCOPY  12/19/2006   Multiple diminutive polyps destroyed-removed (hyperpastic), internal hemorrhoids   ESOPHAGOGASTRODUODENOSCOPY  12/19/2006    lower esophageal ring dilated   HAND SURGERY  1997   left   KNEE ARTHROSCOPY Bilateral    multiple 2 on lft 1 on rt   [place]   Melanoma Removal  Right 2018   right face   TOE SURGERY Left    left foot next to little toe  joint rem   TOTAL KNEE ARTHROPLASTY Right 05/09/2014   Procedure: RIGHT TOTAL KNEE ARTHROPLASTY;  Surgeon: [name], MD;  Location: [facility];  Service: Orthopedics;  Laterality: Right;   TOTAL KNEE ARTHROPLASTY Left 08/06/2021   Procedure: TOTAL KNEE ARTHROPLASTY;  Surgeon: [name], MD;  [facility];  Service: Orthopedics;  Laterality: Left;   UPPER GASTROINTESTINAL ENDOSCOPY     WISDOM TOOTH EXTRACTION      There were no vitals filed for this visit.   Subjective Assessment - 08/11/21 0836     Subjective COVID-19 screen performed prior to patient entering clinic. Feels much better as she was having bathroom issues.    Pertinent History OP, OA, right TKA, hypothyroidism.    How long can you walk comfortably? Around home wiht walker.    Patient Stated Goals Perform ADL's without left knee pain.    Currently in Pain? Yes  Pain Score 4     Pain Location Knee    Pain Orientation Left    Pain Descriptors / Indicators Discomfort    Pain Type Surgical pain    Pain Onset In the past 7 days    Pain Frequency Constant                [facility] PT Assessment - 08/11/21 0001       Assessment   Medical Diagnosis Left total knee replacement    Referring Provider (PT) Esmond Plants MD    Onset Date/Surgical Date 08/06/21    Next MD Visit 08/19/2021      Precautions   Precaution Comments No ultrasound.      Restrictions   Weight Bearing Restrictions No                           [facility] Adult PT Treatment/Exercise - 08/11/21 0001       Exercises   Exercises Knee/Hip      Knee/Hip Exercises: Aerobic   Nustep L2, seat 6-5 x15 min      Knee/Hip Exercises: Standing   Rocker Board 3 minutes      Knee/Hip  Exercises: Supine   Short Arc Quad Sets AROM;Left;15 reps    Heel Slides AROM;Left;10 reps      Modalities   Modalities Vasopneumatic      Vasopneumatic   Number Minutes Vasopneumatic  10 minutes    Vasopnuematic Location  Knee    Vasopneumatic Pressure Medium    Vasopneumatic Temperature  34                          PT Long Term Goals - 08/09/21 1254       PT LONG TERM GOAL #1   Title independent with a HEP.    Time 4    Period Weeks      PT LONG TERM GOAL #2   Title Full active left knee extension in order to normalize gait.    Time 4    Period Weeks    Status New      PT LONG TERM GOAL #3   Title Active knee flexion to 115 degrees+ so the patient can perform functional tasks and do so with pain not > 2-3/10.    Time 4    Period Weeks    Status New      PT LONG TERM GOAL #4   Title Increase left hip and knee strength to 5/5 to provide good stability for accomplishment of functional activities    Time 4    Period Weeks    Status New      PT LONG TERM GOAL #5   Title Perform a reciprocating stair gait with one railing with pain not > 2-3/10.    Time 4    Period Weeks    Status New                   Plan - 08/11/21 0948     Clinical Impression Statement Patient presented in clinic feeling better after having BM. Patient pushed to full end range knee flexion with heel slides. Patient's L foot observed as swollen and gauze wrapped around L knee with only steristrips in place directly over the incision. Patient to check into aquacell over the incision and steristrips after case manager being confused as it was not applied following surgery. TED hose not  in place on LLE due to gauze but needed with edema. Normal vasopnuematic response noted following removal of the modality.    Personal Factors and Comorbidities Comorbidity 1;Comorbidity 2;Other    Comorbidities OP, OA, right TKA, hypothyroidism.    Examination-Activity Limitations  Other;Locomotion Level;Stairs;Carry    Examination-Participation Restrictions Other    Stability/Clinical Decision Making Stable/Uncomplicated    Rehab Potential Excellent    PT Frequency 3x / week    PT Duration 4 weeks    PT Treatment/Interventions ADLs/Self Care Home Management;Cryotherapy;Electrical Stimulation;Moist Heat;Gait training;Stair training;Functional mobility training;Therapeutic activities;Therapeutic exercise;Neuromuscular re-education;Manual techniques;Patient/family education;Passive range of motion;Vasopneumatic Device    PT Next Visit Plan Nustep and progress to recumbent bike.  PROM.  Advance HEP.  Vasopneumatic.  PRE's.    Consulted and Agree with Plan of Care Patient             Patient will benefit from skilled therapeutic intervention in order to improve the following deficits and impairments:  Abnormal gait, Decreased activity tolerance, Decreased range of motion, Decreased strength, Pain  Visit Diagnosis: Chronic pain of left knee  Stiffness of left knee, not elsewhere classified     Problem List Patient Active Problem List   Diagnosis Date Noted   Insomnia 09/23/2020   Dyslipidemia 09/23/2020   RLS (restless legs syndrome) 07/20/2020   Drug side effects, initial encounter 07/20/2020   Coronary atherosclerosis 03/18/2019   Post-traumatic headache, not intractable 12/04/2018   Grief 09/12/2018   Atrophic vaginitis 09/06/2017   Burning sensation of mouth 09/06/2017   Osteopenia 09/05/2016   Chest pain 03/04/2016   Degenerative arthritis of right knee 05/09/2014   Headache(784.0) 07/19/2013   Tinnitus 07/19/2013   Dry skin dermatitis 08/31/2012   Left hip pain 08/31/2012   Cerumen impaction 08/31/2012   Well adult exam 05/25/2012   Upper abdominal pain-chronic 04/02/2011   Thumb pain 03/11/2011   Donor, blood 03/11/2011   Shoulder pain 03/11/2011   Bilateral primary osteoarthritis of knee 09/10/2010   VERTIGO 03/05/2010   OSA on CPAP  07/22/2008   B12 deficiency 08/16/2007   Vitamin D deficiency 08/16/2007   Nonorganic sleep disorder 05/10/2007   SARCOIDOSIS, PULMONARY 05/09/2007   Hypothyroidism 05/09/2007   ALLERGIC RHINITIS 05/09/2007   GERD 05/09/2007   Personal history of colon polyps 05/09/2007    Standley Brooking, PTA 08/11/2021, 9:52 AM  [facility] [address] [place], [place], [address] Phone: [phone]   Fax:  [phone]  Name: YETUNDE LEIS MRN: [id number] Date of Birth: 02-Aug-1940

## 2021-08-13 ENCOUNTER — Other Ambulatory Visit: Payer: Self-pay

## 2021-08-13 ENCOUNTER — Encounter: Payer: Self-pay | Admitting: Physical Therapy

## 2021-08-13 ENCOUNTER — Ambulatory Visit: Payer: Medicare Other | Admitting: Physical Therapy

## 2021-08-13 DIAGNOSIS — M25662 Stiffness of left knee, not elsewhere classified: Secondary | ICD-10-CM | POA: Diagnosis not present

## 2021-08-13 DIAGNOSIS — G8929 Other chronic pain: Secondary | ICD-10-CM | POA: Diagnosis not present

## 2021-08-13 DIAGNOSIS — R293 Abnormal posture: Secondary | ICD-10-CM | POA: Diagnosis not present

## 2021-08-13 DIAGNOSIS — M25562 Pain in left knee: Secondary | ICD-10-CM | POA: Diagnosis not present

## 2021-08-13 NOTE — Therapy (Signed)
[facility] [place], [place], [address] Phone: [phone]   Fax:  [phone]  Physical Therapy Treatment  Patient Details  Name: Elizabeth Gray MRN: [id number] Date of Birth: [birth year] Referring Provider (PT): Esmond Plants MD   Encounter Date: 08/13/2021   PT End of Session - 08/13/21 0909     Visit Number 3    Number of Visits 12    Date for PT Re-Evaluation 09/06/21    Authorization Type FOTO AT LEAST EVERY 5TH VISIT.  PROGRESS NOTE AT 10TH VISIT.  KX MODIFIER AFTER 15 VISITS.    PT Start Time 0905    PT Stop Time 0950    PT Time Calculation (min) 45 min    Equipment Utilized During Treatment Other (comment)   FWW   Activity Tolerance Patient tolerated treatment well    Behavior During Therapy WFL for tasks assessed/performed             Past Medical History:  Diagnosis Date   Allergy    Bursitis    Cancer ([place]) 03/22/2017   Melanoma in situ, lentigo maligna type   Deviated septum    External hemorrhoids without mention of complication 8938   Colonoscopy    Fatigue    GERD (gastroesophageal reflux disease)    Headache(784.0)    migraines   Heat rash    under the breasts.Marland Kitchenappeared on monday.Marland KitchenMarland KitchenBurning & itching, uses cortisone   Hypothyroidism    Iron deficiency anemia, unspecified    Lower esophageal ring 2008   EGD   Nonorganic sleep disorder, unspecified    Osteoarthritis    Osteoporosis    Pancreatitis    Personal history of colonic polyps    Polio 1948   Pulmonary sarcoidosis ([place])    Sleep apnea    [facility]   Vitamin B12 deficiency    Vitamin D deficiency     Past Surgical History:  Procedure Laterality Date   [place]  2002   rt   CHOLECYSTECTOMY  2000   COLONOSCOPY  12/19/2006   Multiple diminutive polyps destroyed-removed (hyperpastic), internal hemorrhoids   ESOPHAGOGASTRODUODENOSCOPY  12/19/2006    lower esophageal ring dilated   HAND SURGERY  1997   left   KNEE ARTHROSCOPY Bilateral    multiple 2 on lft 1 on rt   [place]   Melanoma Removal  Right 2018   right face   TOE SURGERY Left    left foot next to little toe  joint rem   TOTAL KNEE ARTHROPLASTY Right 05/09/2014   Procedure: RIGHT TOTAL KNEE ARTHROPLASTY;  Surgeon: [name], MD;  Location: [facility];  Service: Orthopedics;  Laterality: Right;   TOTAL KNEE ARTHROPLASTY Left 08/06/2021   Procedure: TOTAL KNEE ARTHROPLASTY;  Surgeon: [name], MD;  Location: [facility];  Service: Orthopedics;  Laterality: Left;   UPPER GASTROINTESTINAL ENDOSCOPY     WISDOM TOOTH EXTRACTION      There were no vitals filed for this visit.   Subjective Assessment - 08/13/21 0907     Subjective COVID-19 screen performed prior to patient entering clinic. Feels better today but had more discomfort last night.    Pertinent History OP, OA, right TKA, hypothyroidism.    How long can you walk comfortably? Around home wiht walker.    Patient Stated Goals Perform ADL's without left knee pain.    Currently in Pain? Yes  Pain Score 2     Pain Location Knee    Pain Orientation Left    Pain Descriptors / Indicators Discomfort    Pain Type Surgical pain    Pain Onset In the past 7 days    Pain Frequency Constant                [facility] PT Assessment - 08/13/21 0001       Assessment   Medical Diagnosis Left total knee replacement    Referring Provider (PT) Esmond Plants MD    Onset Date/Surgical Date 08/06/21    Next MD Visit 08/19/2021      Precautions   Precaution Comments No ultrasound.                           [place] Adult PT Treatment/Exercise - 08/13/21 0001       Knee/Hip Exercises: Aerobic   Nustep L3, seat 7-6 x15 min      Knee/Hip Exercises: Standing   Forward Lunges Left;15 reps;3 seconds    Forward Lunges Limitations off 8" step    Rocker Board 3 minutes      Knee/Hip Exercises:  Supine   Short Arc Quad Sets AROM;Left;15 reps      Modalities   Modalities Vasopneumatic      Vasopneumatic   Number Minutes Vasopneumatic  10 minutes    Vasopnuematic Location  Knee    Vasopneumatic Pressure Medium    Vasopneumatic Temperature  34                          PT Long Term Goals - 08/09/21 1254       PT LONG TERM GOAL #1   Title independent with a HEP.    Time 4    Period Weeks      PT LONG TERM GOAL #2   Title Full active left knee extension in order to normalize gait.    Time 4    Period Weeks    Status New      PT LONG TERM GOAL #3   Title Active knee flexion to 115 degrees+ so the patient can perform functional tasks and do so with pain not > 2-3/10.    Time 4    Period Weeks    Status New      PT LONG TERM GOAL #4   Title Increase left hip and knee strength to 5/5 to provide good stability for accomplishment of functional activities    Time 4    Period Weeks    Status New      PT LONG TERM GOAL #5   Title Perform a reciprocating stair gait with one railing with pain not > 2-3/10.    Time 4    Period Weeks    Status New                   Plan - 08/13/21 0940     Clinical Impression Statement Patient presneted in clinic with reports of minimal pain. Patient was given some bandages to use to cover incision while TED hose is donned while neither LE TED hose was donned upon arrival. Patient was progressed for ROM and light quad strengthening with no complaints of pain. Patient had a friend to bring TED hose as LLE swelling was present. TED hose were donned to BLE prior to patient leaving the clinic. More quad soreness reported during treatment  along line from tourniquet. Normal vasopneumatic response noted following removal of the modality.    Personal Factors and Comorbidities Comorbidity 1;Comorbidity 2;Other    Comorbidities OP, OA, right TKA, hypothyroidism.    Examination-Activity Limitations Other;Locomotion  Level;Stairs;Carry    Examination-Participation Restrictions Other    Stability/Clinical Decision Making Stable/Uncomplicated    Rehab Potential Excellent    PT Frequency 3x / week    PT Duration 4 weeks    PT Treatment/Interventions ADLs/Self Care Home Management;Cryotherapy;Electrical Stimulation;Moist Heat;Gait training;Stair training;Functional mobility training;Therapeutic activities;Therapeutic exercise;Neuromuscular re-education;Manual techniques;Patient/family education;Passive range of motion;Vasopneumatic Device    PT Next Visit Plan Nustep and progress to recumbent bike.  PROM.  Advance HEP.  Vasopneumatic.  PRE's.    Consulted and Agree with Plan of Care Patient             Patient will benefit from skilled therapeutic intervention in order to improve the following deficits and impairments:  Abnormal gait, Decreased activity tolerance, Decreased range of motion, Decreased strength, Pain  Visit Diagnosis: Chronic pain of left knee  Stiffness of left knee, not elsewhere classified  Abnormal posture     Problem List Patient Active Problem List   Diagnosis Date Noted   Insomnia 09/23/2020   Dyslipidemia 09/23/2020   RLS (restless legs syndrome) 07/20/2020   Drug side effects, initial encounter 07/20/2020   Coronary atherosclerosis 03/18/2019   Post-traumatic headache, not intractable 12/04/2018   Grief 09/12/2018   Atrophic vaginitis 09/06/2017   Burning sensation of mouth 09/06/2017   Osteopenia 09/05/2016   Chest pain 03/04/2016   Degenerative arthritis of right knee 05/09/2014   Headache(784.0) 07/19/2013   Tinnitus 07/19/2013   Dry skin dermatitis 08/31/2012   Left hip pain 08/31/2012   Cerumen impaction 08/31/2012   Well adult exam 05/25/2012   Upper abdominal pain-chronic 04/02/2011   Thumb pain 03/11/2011   Donor, blood 03/11/2011   Shoulder pain 03/11/2011   Bilateral primary osteoarthritis of knee 09/10/2010   VERTIGO 03/05/2010   OSA on CPAP  07/22/2008   B12 deficiency 08/16/2007   Vitamin D deficiency 08/16/2007   Nonorganic sleep disorder 05/10/2007   SARCOIDOSIS, PULMONARY 05/09/2007   Hypothyroidism 05/09/2007   ALLERGIC RHINITIS 05/09/2007   GERD 05/09/2007   Personal history of colon polyps 05/09/2007    Standley Brooking, PTA 08/13/2021, 10:24 AM  [facility] [address] [place], [place], [address] Phone: [phone]   Fax:  [phone]  Name: Elizabeth Gray MRN: [id number] Date of Birth: 01-14-1941

## 2021-08-16 ENCOUNTER — Other Ambulatory Visit: Payer: Self-pay

## 2021-08-16 ENCOUNTER — Encounter: Payer: Self-pay | Admitting: Physical Therapy

## 2021-08-16 ENCOUNTER — Ambulatory Visit: Payer: Medicare Other | Admitting: Physical Therapy

## 2021-08-16 DIAGNOSIS — R293 Abnormal posture: Secondary | ICD-10-CM | POA: Diagnosis not present

## 2021-08-16 DIAGNOSIS — G8929 Other chronic pain: Secondary | ICD-10-CM | POA: Diagnosis not present

## 2021-08-16 DIAGNOSIS — M25562 Pain in left knee: Secondary | ICD-10-CM | POA: Diagnosis not present

## 2021-08-16 DIAGNOSIS — M25662 Stiffness of left knee, not elsewhere classified: Secondary | ICD-10-CM | POA: Diagnosis not present

## 2021-08-16 NOTE — Therapy (Signed)
[facility] [place], [place], [address] Phone: [phone]   Fax:  [phone]  Physical Therapy Treatment  Patient Details  Name: Elizabeth Gray MRN: [id number] Date of Birth: 07/29/1941 Referring Provider (PT): Esmond Plants MD   Encounter Date: 08/16/2021   PT End of Session - 08/16/21 0957     Visit Number 4    Number of Visits 12    Date for PT Re-Evaluation 09/06/21    Authorization Type FOTO AT LEAST EVERY 5TH VISIT.  PROGRESS NOTE AT 10TH VISIT.  KX MODIFIER AFTER 15 VISITS.    PT Start Time [phone]    PT Stop Time 1031    PT Time Calculation (min) 41 min    Activity Tolerance Patient tolerated treatment well    Behavior During Therapy WFL for tasks assessed/performed             Past Medical History:  Diagnosis Date   Allergy    Bursitis    Cancer ([place]) 03/22/2017   Melanoma in situ, lentigo maligna type   Deviated septum    External hemorrhoids without mention of complication 1540   Colonoscopy    Fatigue    GERD (gastroesophageal reflux disease)    Headache(784.0)    migraines   Heat rash    under the breasts.Marland Kitchenappeared on monday.Marland KitchenMarland KitchenBurning & itching, uses cortisone   Hypothyroidism    Iron deficiency anemia, unspecified    Lower esophageal ring 2008   EGD   Nonorganic sleep disorder, unspecified    Osteoarthritis    Osteoporosis    Pancreatitis    Personal history of colonic polyps    Polio 1948   Pulmonary sarcoidosis ([place])    Sleep apnea    [facility]   Vitamin B12 deficiency    Vitamin D deficiency     Past Surgical History:  Procedure Laterality Date   [place]  2002   rt   CHOLECYSTECTOMY  2000   COLONOSCOPY  12/19/2006   Multiple diminutive polyps destroyed-removed (hyperpastic), internal hemorrhoids   ESOPHAGOGASTRODUODENOSCOPY  12/19/2006   lower esophageal ring dilated   HAND SURGERY  1997   left    KNEE ARTHROSCOPY Bilateral    multiple 2 on lft 1 on rt   [place]   Melanoma Removal  Right 2018   right face   TOE SURGERY Left    left foot next to little toe  joint rem   TOTAL KNEE ARTHROPLASTY Right 05/09/2014   Procedure: RIGHT TOTAL KNEE ARTHROPLASTY;  Surgeon: [name], MD;  Location: [facility];  Service: Orthopedics;  Laterality: Right;   TOTAL KNEE ARTHROPLASTY Left 08/06/2021   Procedure: TOTAL KNEE ARTHROPLASTY;  Surgeon: [name], MD;  [facility];  Service: Orthopedics;  Laterality: Left;   UPPER GASTROINTESTINAL ENDOSCOPY     WISDOM TOOTH EXTRACTION      There were no vitals filed for this visit.   Subjective Assessment - 08/16/21 0954     Subjective COVID-19 screen performed prior to patient entering clinic. Patient reports more L shoulder pain over the weekend. Able to do SLRs over the weekend. Leaves TED hose on during showers as she does not have help to put them back on.    Pertinent History OP, OA, right TKA, hypothyroidism.    How long can you walk comfortably? Around home wiht walker.    Patient Stated Goals Perform  ADL's without left knee pain.    Currently in Pain? Yes    Pain Location Knee    Pain Orientation Left    Pain Descriptors / Indicators Discomfort    Pain Type Surgical pain    Pain Onset 1 to 4 weeks ago    Pain Frequency Constant                [facility] PT Assessment - 08/16/21 0001       Assessment   Medical Diagnosis Left total knee replacement    Referring Provider (PT) Esmond Plants MD    Onset Date/Surgical Date 08/06/21    Next MD Visit 08/19/2021      Precautions   Precaution Comments No ultrasound.      Sensation   Additional Comments Reports a burning lateral to L achilles      ROM / Strength   AROM / PROM / Strength AROM      AROM   Overall AROM  Deficits    AROM Assessment Site Knee    Right/Left Knee Left    Left Knee Extension 7    Left Knee Flexion 91                            [facility] Adult PT Treatment/Exercise - 08/16/21 0001       Knee/Hip Exercises: Aerobic   Nustep L3, seat 9-7 x15 min      Knee/Hip Exercises: Standing   Hip Flexion AROM;Left;15 reps;Knee bent;Limitations    Hip Flexion Limitations visual cues/blocking to prevent circumduction    Forward Lunges Left;15 reps;Limitations    Forward Lunges Limitations 10 sec holds; 8" step    Hip Abduction AROM;Both;3 sets;10 reps;Knee straight    Rocker Board 3 minutes      Knee/Hip Exercises: Seated   Long Arc Quad AROM;Left;20 reps;Limitations    Long Arc Quad Limitations 5 sec holds      Modalities   Modalities Vasopneumatic      Vasopneumatic   Number Minutes Vasopneumatic  10 minutes    Vasopnuematic Location  Knee    Vasopneumatic Pressure Medium    Vasopneumatic Temperature  34                          PT Long Term Goals - 08/09/21 1254       PT LONG TERM GOAL #1   Title independent with a HEP.    Time 4    Period Weeks      PT LONG TERM GOAL #2   Title Full active left knee extension in order to normalize gait.    Time 4    Period Weeks    Status New      PT LONG TERM GOAL #3   Title Active knee flexion to 115 degrees+ so the patient can perform functional tasks and do so with pain not > 2-3/10.    Time 4    Period Weeks    Status New      PT LONG TERM GOAL #4   Title Increase left hip and knee strength to 5/5 to provide good stability for accomplishment of functional activities    Time 4    Period Weeks    Status New      PT LONG TERM GOAL #5   Title Perform a reciprocating stair gait with one railing with pain not > 2-3/10.    Time  [place] - 08/16/21 1028     Clinical Impression Statement Patient presented in clinic with reports of more L shoulder pain but from an incident last week as she reports she does have force UE support on FWW. Patient states that LLE  edema is much better since TED hose were donned last week. Patient progressed to more standing exercises and quad progression which she states is still very sore from tourniquet application. Visual cues/blocking utilized during hip flexion to avoid circumduction. AROM of L knee measured as 7-90 deg. Normal vasopnuematic response noted following removal of the modality.    Personal Factors and Comorbidities Comorbidity 1;Comorbidity 2;Other    Comorbidities OP, OA, right TKA, hypothyroidism.    Examination-Activity Limitations Other;Locomotion Level;Stairs;Carry    Examination-Participation Restrictions Other    Stability/Clinical Decision Making Stable/Uncomplicated    Rehab Potential Excellent    PT Frequency 3x / week    PT Duration 4 weeks    PT Treatment/Interventions ADLs/Self Care Home Management;Cryotherapy;Electrical Stimulation;Moist Heat;Gait training;Stair training;Functional mobility training;Therapeutic activities;Therapeutic exercise;Neuromuscular re-education;Manual techniques;Patient/family education;Passive range of motion;Vasopneumatic Device    PT Next Visit Plan Nustep and progress to recumbent bike.  PROM.  Advance HEP.  Vasopneumatic.  PRE's.    Consulted and Agree with Plan of Care Patient             Patient will benefit from skilled therapeutic intervention in order to improve the following deficits and impairments:  Abnormal gait, Decreased activity tolerance, Decreased range of motion, Decreased strength, Pain  Visit Diagnosis: Chronic pain of left knee  Stiffness of left knee, not elsewhere classified  Abnormal posture     Problem List Patient Active Problem List   Diagnosis Date Noted   Insomnia 09/23/2020   Dyslipidemia 09/23/2020   RLS (restless legs syndrome) 07/20/2020   Drug side effects, initial encounter 07/20/2020   Coronary atherosclerosis 03/18/2019   Post-traumatic headache, not intractable 12/04/2018   Grief 09/12/2018   Atrophic  vaginitis 09/06/2017   Burning sensation of mouth 09/06/2017   Osteopenia 09/05/2016   Chest pain 03/04/2016   Degenerative arthritis of right knee 05/09/2014   Headache(784.0) 07/19/2013   Tinnitus 07/19/2013   Dry skin dermatitis 08/31/2012   Left hip pain 08/31/2012   Cerumen impaction 08/31/2012   Well adult exam 05/25/2012   Upper abdominal pain-chronic 04/02/2011   Thumb pain 03/11/2011   Donor, blood 03/11/2011   Shoulder pain 03/11/2011   Bilateral primary osteoarthritis of knee 09/10/2010   VERTIGO 03/05/2010   OSA on CPAP 07/22/2008   B12 deficiency 08/16/2007   Vitamin D deficiency 08/16/2007   Nonorganic sleep disorder 05/10/2007   SARCOIDOSIS, PULMONARY 05/09/2007   Hypothyroidism 05/09/2007   ALLERGIC RHINITIS 05/09/2007   GERD 05/09/2007   Personal history of colon polyps 05/09/2007    Standley Brooking, PTA 08/16/2021, 10:39 AM  [facility] [address] [place], [place], [address] Phone: [phone]   Fax:  [phone]  Name: Elizabeth Gray MRN: [id number] Date of Birth: [birth year]

## 2021-08-17 ENCOUNTER — Encounter: Payer: Self-pay | Admitting: Physical Therapy

## 2021-08-17 ENCOUNTER — Other Ambulatory Visit: Payer: Self-pay | Admitting: Internal Medicine

## 2021-08-17 ENCOUNTER — Ambulatory Visit: Payer: Medicare Other | Admitting: Physical Therapy

## 2021-08-17 DIAGNOSIS — M25562 Pain in left knee: Secondary | ICD-10-CM | POA: Diagnosis not present

## 2021-08-17 DIAGNOSIS — G8929 Other chronic pain: Secondary | ICD-10-CM

## 2021-08-17 DIAGNOSIS — M25662 Stiffness of left knee, not elsewhere classified: Secondary | ICD-10-CM | POA: Diagnosis not present

## 2021-08-17 DIAGNOSIS — R293 Abnormal posture: Secondary | ICD-10-CM | POA: Diagnosis not present

## 2021-08-17 NOTE — Therapy (Signed)
[facility] [place], [place], [address] Phone: [phone]   Fax:  [phone]  Physical Therapy Treatment  Patient Details  Name: Elizabeth Gray MRN: [id number] Date of Birth: [birth year] Referring Provider (PT): Esmond Plants MD   Encounter Date: 08/17/2021   PT End of Session - 08/17/21 1525     Visit Number 5    Number of Visits 12    Date for PT Re-Evaluation 09/06/21    Authorization Type FOTO AT LEAST EVERY 5TH VISIT.  PROGRESS NOTE AT 10TH VISIT.  KX MODIFIER AFTER 15 VISITS.    PT Start Time 1520    PT Stop Time 1608    PT Time Calculation (min) 48 min    Equipment Utilized During Treatment Other (comment)   FWW   Activity Tolerance Patient tolerated treatment well    Behavior During Therapy WFL for tasks assessed/performed             Past Medical History:  Diagnosis Date   Allergy    Bursitis    Cancer ([place]) 03/22/2017   Melanoma in situ, lentigo maligna type   Deviated septum    External hemorrhoids without mention of complication 1194   Colonoscopy    Fatigue    GERD (gastroesophageal reflux disease)    Headache(784.0)    migraines   Heat rash    under the breasts.Marland Kitchenappeared on monday.Marland KitchenMarland KitchenBurning & itching, uses cortisone   Hypothyroidism    Iron deficiency anemia, unspecified    Lower esophageal ring 2008   EGD   Nonorganic sleep disorder, unspecified    Osteoarthritis    Osteoporosis    Pancreatitis    Personal history of colonic polyps    Polio 1948   Pulmonary sarcoidosis ([place])    Sleep apnea    [facility]   Vitamin B12 deficiency    Vitamin D deficiency     Past Surgical History:  Procedure Laterality Date   [place]  2002   rt   CHOLECYSTECTOMY  2000   COLONOSCOPY  12/19/2006   Multiple diminutive polyps destroyed-removed (hyperpastic), internal hemorrhoids   ESOPHAGOGASTRODUODENOSCOPY  12/19/2006    lower esophageal ring dilated   HAND SURGERY  1997   left   KNEE ARTHROSCOPY Bilateral    multiple 2 on lft 1 on rt   [place]   Melanoma Removal  Right 2018   right face   TOE SURGERY Left    left foot next to little toe  joint rem   TOTAL KNEE ARTHROPLASTY Right 05/09/2014   Procedure: RIGHT TOTAL KNEE ARTHROPLASTY;  Surgeon: [name], MD;  Location: [facility];  Service: Orthopedics;  Laterality: Right;   TOTAL KNEE ARTHROPLASTY Left 08/06/2021   Procedure: TOTAL KNEE ARTHROPLASTY;  Surgeon: [name], MD;  Location: [facility];  Service: Orthopedics;  Laterality: Left;   UPPER GASTROINTESTINAL ENDOSCOPY     WISDOM TOOTH EXTRACTION      There were no vitals filed for this visit.   Subjective Assessment - 08/17/21 1522     Subjective COVID-19 screen performed prior to patient entering clinic. Still has some burning in lateral L ankle. Does her home bike and exercise and has had a shower today.    Pertinent History OP, OA, right TKA, hypothyroidism.    How long can you walk comfortably? Around home wiht walker.    Patient Stated Goals Perform ADL's without  left knee pain.    Currently in Pain? Yes    Pain Location Knee    Pain Orientation Left    Pain Descriptors / Indicators Discomfort    Pain Type Surgical pain    Pain Onset 1 to 4 weeks ago    Pain Frequency Intermittent                [facility] PT Assessment - 08/17/21 0001       Assessment   Medical Diagnosis Left total knee replacement    Referring Provider (PT) Esmond Plants MD    Onset Date/Surgical Date 08/06/21    Next MD Visit 08/19/2021      Precautions   Precaution Comments No ultrasound.      Restrictions   Weight Bearing Restrictions No      ROM / Strength   AROM / PROM / Strength AROM      AROM   Overall AROM  Deficits    AROM Assessment Site Knee    Right/Left Knee Left    Left Knee Extension 5    Left Knee Flexion 95                           [facility]  Adult PT Treatment/Exercise - 08/17/21 0001       Knee/Hip Exercises: Aerobic   Nustep L1, seat 8-6 x16 min      Knee/Hip Exercises: Standing   Knee Flexion AROM;Left;10 reps    Forward Lunges Left;15 reps;Limitations    Forward Lunges Limitations 10 sec holds; 8" step    Hip Abduction AROM;Left;20 reps;Knee straight    Forward Step Up Left;15 reps;Hand Hold: 2;Step Height: 6"    Rocker Board 3 minutes      Knee/Hip Exercises: Seated   Long Arc Quad AROM;Left;20 reps;Limitations    Long Arc Quad Limitations 5 sec holds      Modalities   Modalities Vasopneumatic      Vasopneumatic   Number Minutes Vasopneumatic  10 minutes    Vasopnuematic Location  Knee    Vasopneumatic Pressure Medium    Vasopneumatic Temperature  34                          PT Long Term Goals - 08/09/21 1254       PT LONG TERM GOAL #1   Title independent with a HEP.    Time 4    Period Weeks      PT LONG TERM GOAL #2   Title Full active left knee extension in order to normalize gait.    Time 4    Period Weeks    Status New      PT LONG TERM GOAL #3   Title Active knee flexion to 115 degrees+ so the patient can perform functional tasks and do so with pain not > 2-3/10.    Time 4    Period Weeks    Status New      PT LONG TERM GOAL #4   Title Increase left hip and knee strength to 5/5 to provide good stability for accomplishment of functional activities    Time 4    Period Weeks    Status New      PT LONG TERM GOAL #5   Title Perform a reciprocating stair gait with one railing with pain not > 2-3/10.    Time 4    Period Weeks  Status New                   Plan - 08/17/21 1600     Clinical Impression Statement Patient presented in clinic with reports of overall improvement regarding L knee pain. Discomfort of L shoulder continues but patient only uses minimal support from [facility]. Patient progressed to standing forward steps ups using LLE first which she was  pleased with. AROM of L knee measured as 5-97 deg today. All postsurgical bandaging still donned and TED hose were switched out during PT session. Normal vasopneumatic response noted following removal of the modality.    Personal Factors and Comorbidities Comorbidity 1;Comorbidity 2;Other    Comorbidities OP, OA, right TKA, hypothyroidism.    Examination-Activity Limitations Other;Locomotion Level;Stairs;Carry    Examination-Participation Restrictions Other    Stability/Clinical Decision Making Stable/Uncomplicated    Rehab Potential Excellent    PT Frequency 3x / week    PT Duration 4 weeks    PT Treatment/Interventions ADLs/Self Care Home Management;Cryotherapy;Electrical Stimulation;Moist Heat;Gait training;Stair training;Functional mobility training;Therapeutic activities;Therapeutic exercise;Neuromuscular re-education;Manual techniques;Patient/family education;Passive range of motion;Vasopneumatic Device    PT Next Visit Plan Nustep and progress to recumbent bike.  PROM.  Advance HEP.  Vasopneumatic.  PRE's.    Consulted and Agree with Plan of Care Patient             Patient will benefit from skilled therapeutic intervention in order to improve the following deficits and impairments:  Abnormal gait, Decreased activity tolerance, Decreased range of motion, Decreased strength, Pain  Visit Diagnosis: Chronic pain of left knee  Stiffness of left knee, not elsewhere classified     Problem List Patient Active Problem List   Diagnosis Date Noted   Insomnia 09/23/2020   Dyslipidemia 09/23/2020   RLS (restless legs syndrome) 07/20/2020   Drug side effects, initial encounter 07/20/2020   Coronary atherosclerosis 03/18/2019   Post-traumatic headache, not intractable 12/04/2018   Grief 09/12/2018   Atrophic vaginitis 09/06/2017   Burning sensation of mouth 09/06/2017   Osteopenia 09/05/2016   Chest pain 03/04/2016   Degenerative arthritis of right knee 05/09/2014    Headache(784.0) 07/19/2013   Tinnitus 07/19/2013   Dry skin dermatitis 08/31/2012   Left hip pain 08/31/2012   Cerumen impaction 08/31/2012   Well adult exam 05/25/2012   Upper abdominal pain-chronic 04/02/2011   Thumb pain 03/11/2011   Donor, blood 03/11/2011   Shoulder pain 03/11/2011   Bilateral primary osteoarthritis of knee 09/10/2010   VERTIGO 03/05/2010   OSA on CPAP 07/22/2008   B12 deficiency 08/16/2007   Vitamin D deficiency 08/16/2007   Nonorganic sleep disorder 05/10/2007   SARCOIDOSIS, PULMONARY 05/09/2007   Hypothyroidism 05/09/2007   ALLERGIC RHINITIS 05/09/2007   GERD 05/09/2007   Personal history of colon polyps 05/09/2007    Standley Brooking, PTA 08/17/2021, 5:00 PM  [facility] [place], [place], [address] Phone: [phone]   Fax:  [phone]  Name: Elizabeth Gray MRN: [id number] Date of Birth: August 28, 1940

## 2021-08-18 ENCOUNTER — Encounter: Payer: Medicare Other | Admitting: Physical Therapy

## 2021-08-19 ENCOUNTER — Ambulatory Visit: Payer: Medicare Other | Admitting: Physical Therapy

## 2021-08-19 ENCOUNTER — Other Ambulatory Visit: Payer: Self-pay

## 2021-08-19 ENCOUNTER — Encounter: Payer: Self-pay | Admitting: Physical Therapy

## 2021-08-19 DIAGNOSIS — M25662 Stiffness of left knee, not elsewhere classified: Secondary | ICD-10-CM | POA: Diagnosis not present

## 2021-08-19 DIAGNOSIS — R293 Abnormal posture: Secondary | ICD-10-CM | POA: Diagnosis not present

## 2021-08-19 DIAGNOSIS — M25562 Pain in left knee: Secondary | ICD-10-CM

## 2021-08-19 DIAGNOSIS — M7542 Impingement syndrome of left shoulder: Secondary | ICD-10-CM | POA: Diagnosis not present

## 2021-08-19 DIAGNOSIS — G8929 Other chronic pain: Secondary | ICD-10-CM | POA: Diagnosis not present

## 2021-08-19 DIAGNOSIS — Z4789 Encounter for other orthopedic aftercare: Secondary | ICD-10-CM | POA: Diagnosis not present

## 2021-08-19 NOTE — Therapy (Signed)
[facility] [place], [place], [address] Phone: [phone]   Fax:  [phone]  Physical Therapy Treatment  Patient Details  Name: Elizabeth Gray MRN: [id number] Date of Birth: 04/03/1941 Referring Provider (PT): Esmond Plants MD   Encounter Date: 08/19/2021   PT End of Session - 08/19/21 1512     Visit Number 6    Number of Visits 12    Date for PT Re-Evaluation 09/06/21    Authorization Type FOTO AT LEAST EVERY 5TH VISIT.  PROGRESS NOTE AT 10TH VISIT.  KX MODIFIER AFTER 15 VISITS.    PT Start Time 1434    PT Stop Time 1519    PT Time Calculation (min) 45 min    Equipment Utilized During Treatment Other (comment)   FWW   Activity Tolerance Patient tolerated treatment well    Behavior During Therapy WFL for tasks assessed/performed             Past Medical History:  Diagnosis Date   Allergy    Bursitis    Cancer ([place]) 03/22/2017   Melanoma in situ, lentigo maligna type   Deviated septum    External hemorrhoids without mention of complication 2947   Colonoscopy    Fatigue    GERD (gastroesophageal reflux disease)    Headache(784.0)    migraines   Heat rash    under the breasts.Marland Kitchenappeared on monday.Marland KitchenMarland KitchenBurning & itching, uses cortisone   Hypothyroidism    Iron deficiency anemia, unspecified    Lower esophageal ring 2008   EGD   Nonorganic sleep disorder, unspecified    Osteoarthritis    Osteoporosis    Pancreatitis    Personal history of colonic polyps    Polio 1948   Pulmonary sarcoidosis ([place])    Sleep apnea    [facility]   Vitamin B12 deficiency    Vitamin D deficiency     Past Surgical History:  Procedure Laterality Date   [place]  2002   rt   CHOLECYSTECTOMY  2000   COLONOSCOPY  12/19/2006   Multiple diminutive polyps destroyed-removed (hyperpastic), internal hemorrhoids   ESOPHAGOGASTRODUODENOSCOPY  12/19/2006    lower esophageal ring dilated   HAND SURGERY  1997   left   KNEE ARTHROSCOPY Bilateral    multiple 2 on lft 1 on rt   [place]   Melanoma Removal  Right 2018   right face   TOE SURGERY Left    left foot next to little toe  joint rem   TOTAL KNEE ARTHROPLASTY Right 05/09/2014   Procedure: RIGHT TOTAL KNEE ARTHROPLASTY;  Surgeon: [name], MD;  Location: [facility];  Service: Orthopedics;  Laterality: Right;   TOTAL KNEE ARTHROPLASTY Left 08/06/2021   Procedure: TOTAL KNEE ARTHROPLASTY;  Surgeon: [name], MD;  [facility];  Service: Orthopedics;  Laterality: Left;   UPPER GASTROINTESTINAL ENDOSCOPY     WISDOM TOOTH EXTRACTION      There were no vitals filed for this visit.   Subjective Assessment - 08/19/21 1512     Subjective COVID-19 screen performed prior to patient entering clinic. MD released her to drive and return to work. More burning in L ankle from set up in surgery and some medial knee pain at times.    Pertinent History OP, OA, right TKA, hypothyroidism.    How long can you walk comfortably? Around home wiht walker.  Patient Stated Goals Perform ADL's without left knee pain.    Currently in Pain? No/denies                [facility] PT Assessment - 08/19/21 0001       Assessment   Medical Diagnosis Left total knee replacement    Referring Provider (PT) Esmond Plants MD    Onset Date/Surgical Date 08/06/21    Next MD Visit 09/16/2021      Precautions   Precaution Comments No ultrasound.      ROM / Strength   AROM / PROM / Strength AROM      AROM   Overall AROM  Deficits    AROM Assessment Site Knee    Right/Left Knee Left    Left Knee Flexion 101                           [facility] Adult PT Treatment/Exercise - 08/19/21 0001       Knee/Hip Exercises: Aerobic   Nustep L4, seat 8-6 x15 min      Knee/Hip Exercises: Standing   Forward Lunges Left;Limitations;20 reps    Forward Lunges Limitations 10 sec holds; 8"  step    Forward Step Up Left;15 reps;Hand Hold: 2;Step Height: 6"    Step Down Left;15 reps;Hand Hold: 2;Step Height: 4"    Rocker Board 3 minutes      Knee/Hip Exercises: Seated   Long Arc Quad Strengthening;Left;2 sets;10 reps    Long Arc Quad Weight 2 lbs.      Modalities   Modalities Vasopneumatic      Vasopneumatic   Number Minutes Vasopneumatic  10 minutes    Vasopnuematic Location  Knee    Vasopneumatic Pressure Medium    Vasopneumatic Temperature  34                          PT Long Term Goals - 08/19/21 1518       PT LONG TERM GOAL #1   Title independent with a HEP.    Time 4    Period Weeks      PT LONG TERM GOAL #2   Title Full active left knee extension in order to normalize gait.    Time 4    Period Weeks    Status New      PT LONG TERM GOAL #3   Title Active knee flexion to 115 degrees+ so the patient can perform functional tasks and do so with pain not > 2-3/10.    Time 4    Period Weeks    Status New      PT LONG TERM GOAL #4   Title Increase left hip and knee strength to 5/5 to provide good stability for accomplishment of functional activities    Time 4    Period Weeks    Status New      PT LONG TERM GOAL #5   Title Perform a reciprocating stair gait with one railing with pain not > 2-3/10.    Time 4    Period Weeks    Status New                   Plan - 08/19/21 1522     Clinical Impression Statement Patient presented in clinic with less discomfort in L knee. Patient was diagnosed with shoulder bursitis in L shoulder and received an injection. Patient progressing with functional activities  for step ups and step downs. AROM also improved as all postsurgical bandages were removed. TED hose still in tact over the LLE but RLE was removed per MD request. Normal vasopneumatic response noted following removal of the modality.    Personal Factors and Comorbidities Comorbidity 1;Comorbidity 2;Other    Comorbidities OP, OA,  right TKA, hypothyroidism.    Examination-Activity Limitations Other;Locomotion Level;Stairs;Carry    Examination-Participation Restrictions Other    Stability/Clinical Decision Making Stable/Uncomplicated    Rehab Potential Excellent    PT Frequency 3x / week    PT Duration 4 weeks    PT Treatment/Interventions ADLs/Self Care Home Management;Cryotherapy;Electrical Stimulation;Moist Heat;Gait training;Stair training;Functional mobility training;Therapeutic activities;Therapeutic exercise;Neuromuscular re-education;Manual techniques;Patient/family education;Passive range of motion;Vasopneumatic Device    PT Next Visit Plan Nustep and progress to recumbent bike.  PROM.  Advance HEP.  Vasopneumatic.  PRE's.    Consulted and Agree with Plan of Care Patient             Patient will benefit from skilled therapeutic intervention in order to improve the following deficits and impairments:  Abnormal gait, Decreased activity tolerance, Decreased range of motion, Decreased strength, Pain  Visit Diagnosis: Chronic pain of left knee  Stiffness of left knee, not elsewhere classified  Abnormal posture     Problem List Patient Active Problem List   Diagnosis Date Noted   Insomnia 09/23/2020   Dyslipidemia 09/23/2020   RLS (restless legs syndrome) 07/20/2020   Drug side effects, initial encounter 07/20/2020   Coronary atherosclerosis 03/18/2019   Post-traumatic headache, not intractable 12/04/2018   Grief 09/12/2018   Atrophic vaginitis 09/06/2017   Burning sensation of mouth 09/06/2017   Osteopenia 09/05/2016   Chest pain 03/04/2016   Degenerative arthritis of right knee 05/09/2014   Headache(784.0) 07/19/2013   Tinnitus 07/19/2013   Dry skin dermatitis 08/31/2012   Left hip pain 08/31/2012   Cerumen impaction 08/31/2012   Well adult exam 05/25/2012   Upper abdominal pain-chronic 04/02/2011   Thumb pain 03/11/2011   Donor, blood 03/11/2011   Shoulder pain 03/11/2011   Bilateral  primary osteoarthritis of knee 09/10/2010   VERTIGO 03/05/2010   OSA on CPAP 07/22/2008   B12 deficiency 08/16/2007   Vitamin D deficiency 08/16/2007   Nonorganic sleep disorder 05/10/2007   SARCOIDOSIS, PULMONARY 05/09/2007   Hypothyroidism 05/09/2007   ALLERGIC RHINITIS 05/09/2007   GERD 05/09/2007   Personal history of colon polyps 05/09/2007    Standley Brooking, PTA 08/19/2021, 3:28 PM  [facility] [address] [place], [place], [address] Phone: [phone]   Fax:  [phone]  Name: Elizabeth Gray MRN: [id number] Date of Birth: 07/19/1941

## 2021-08-20 ENCOUNTER — Encounter: Payer: Medicare Other | Admitting: Physical Therapy

## 2021-08-23 ENCOUNTER — Other Ambulatory Visit: Payer: Self-pay | Admitting: Internal Medicine

## 2021-08-24 ENCOUNTER — Encounter: Payer: Self-pay | Admitting: Physical Therapy

## 2021-08-24 ENCOUNTER — Ambulatory Visit: Payer: Medicare Other | Admitting: Physical Therapy

## 2021-08-24 ENCOUNTER — Other Ambulatory Visit: Payer: Self-pay

## 2021-08-24 DIAGNOSIS — G8929 Other chronic pain: Secondary | ICD-10-CM | POA: Diagnosis not present

## 2021-08-24 DIAGNOSIS — M25662 Stiffness of left knee, not elsewhere classified: Secondary | ICD-10-CM | POA: Diagnosis not present

## 2021-08-24 DIAGNOSIS — R293 Abnormal posture: Secondary | ICD-10-CM | POA: Diagnosis not present

## 2021-08-24 DIAGNOSIS — M25562 Pain in left knee: Secondary | ICD-10-CM

## 2021-08-24 NOTE — Therapy (Signed)
[facility] [place], [place], [address] Phone: [phone]   Fax:  [phone]  Physical Therapy Treatment  Patient Details  Name: KAMAURI KATHOL MRN: [id number] Date of Birth: 1941/04/24 Referring Provider (PT): Esmond Plants MD   Encounter Date: 08/24/2021   PT End of Session - 08/24/21 1435     Visit Number 7    Number of Visits 12    Date for PT Re-Evaluation 09/06/21    Authorization Type FOTO AT LEAST EVERY 5TH VISIT.  PROGRESS NOTE AT 10TH VISIT.  KX MODIFIER AFTER 15 VISITS.    PT Start Time 1433    PT Stop Time 1516    PT Time Calculation (min) 43 min    Equipment Utilized During Treatment Other (comment)   FWW   Activity Tolerance Patient tolerated treatment well    Behavior During Therapy WFL for tasks assessed/performed             Past Medical History:  Diagnosis Date   Allergy    Bursitis    Cancer ([place]) 03/22/2017   Melanoma in situ, lentigo maligna type   Deviated septum    External hemorrhoids without mention of complication 7322   Colonoscopy    Fatigue    GERD (gastroesophageal reflux disease)    Headache(784.0)    migraines   Heat rash    under the breasts.Marland Kitchenappeared on monday.Marland KitchenMarland KitchenBurning & itching, uses cortisone   Hypothyroidism    Iron deficiency anemia, unspecified    Lower esophageal ring 2008   EGD   Nonorganic sleep disorder, unspecified    Osteoarthritis    Osteoporosis    Pancreatitis    Personal history of colonic polyps    Polio 1948   Pulmonary sarcoidosis ([place])    Sleep apnea    [facility]   Vitamin B12 deficiency    Vitamin D deficiency     Past Surgical History:  Procedure Laterality Date   [place]  2002   rt   CHOLECYSTECTOMY  2000   COLONOSCOPY  12/19/2006   Multiple diminutive polyps destroyed-removed (hyperpastic), internal hemorrhoids   ESOPHAGOGASTRODUODENOSCOPY  12/19/2006    lower esophageal ring dilated   HAND SURGERY  1997   left   KNEE ARTHROSCOPY Bilateral    multiple 2 on lft 1 on rt   [place]   Melanoma Removal  Right 2018   right face   TOE SURGERY Left    left foot next to little toe  joint rem   TOTAL KNEE ARTHROPLASTY Right 05/09/2014   Procedure: RIGHT TOTAL KNEE ARTHROPLASTY;  Surgeon: [name], MD;  Location: [place];  Service: Orthopedics;  Laterality: Right;   TOTAL KNEE ARTHROPLASTY Left 08/06/2021   Procedure: TOTAL KNEE ARTHROPLASTY;  Surgeon: [name], MD;  Location: [facility];  Service: Orthopedics;  Laterality: Left;   UPPER GASTROINTESTINAL ENDOSCOPY     WISDOM TOOTH EXTRACTION      There were no vitals filed for this visit.   Subjective Assessment - 08/24/21 1430     Subjective COVID-19 screen performed prior to patient entering clinic. Has returned to work some at [employer]. Still having trouble with L shoulder and intermittantly uncomfortable and difficulty sleeping.    Pertinent History OP, OA, right TKA, hypothyroidism.    How long can you walk comfortably? Around home wiht walker.    Patient Stated Goals Perform ADL's without  left knee pain.    Currently in Pain? Yes    Pain Score 3     Pain Location Knee    Pain Orientation Left    Pain Descriptors / Indicators Tightness    Pain Type Surgical pain    Pain Onset 1 to 4 weeks ago    Pain Frequency Intermittent                [facility] PT Assessment - 08/24/21 0001       Assessment   Medical Diagnosis Left total knee replacement    Referring Provider (PT) Esmond Plants MD    Onset Date/Surgical Date 08/06/21    Next MD Visit 09/16/2021      Precautions   Precaution Comments No ultrasound.      Restrictions   Weight Bearing Restrictions No      ROM / Strength   AROM / PROM / Strength AROM      AROM   Overall AROM  Within functional limits for tasks performed    AROM Assessment Site Knee    Right/Left Knee Left    Left Knee  Extension 5    Left Knee Flexion 112                           [facility] Adult PT Treatment/Exercise - 08/24/21 0001       Knee/Hip Exercises: Aerobic   Nustep L3, seat 7-6 x15 min      Knee/Hip Exercises: Standing   Forward Lunges Left;Limitations;20 reps    Forward Lunges Limitations 10 sec holds; 8" step    Step Down Left;15 reps;Hand Hold: 2;Step Height: 4"    Rocker Board 3 minutes      Knee/Hip Exercises: Seated   Long Arc Quad Strengthening;Left;2 sets;10 reps    Long Arc Quad Weight 3 lbs.      Knee/Hip Exercises: Supine   Heel Prop for Knee Extension 3 minutes;Weight    Heel Prop for Knee Extension Weight (lbs) 2      Modalities   Modalities Vasopneumatic      Vasopneumatic   Number Minutes Vasopneumatic  10 minutes    Vasopnuematic Location  Knee    Vasopneumatic Pressure Medium    Vasopneumatic Temperature  34                          PT Long Term Goals - 08/24/21 1507       PT LONG TERM GOAL #1   Title independent with a HEP.    Time 4    Period Weeks    Status On-going      PT LONG TERM GOAL #2   Title Full active left knee extension in order to normalize gait.    Time 4    Period Weeks    Status On-going      PT LONG TERM GOAL #3   Title Active knee flexion to 115 degrees+ so the patient can perform functional tasks and do so with pain not > 2-3/10.    Time 4    Period Weeks    Status On-going      PT LONG TERM GOAL #4   Title Increase left hip and knee strength to 5/5 to provide good stability for accomplishment of functional activities    Time 4    Period Weeks    Status On-going      PT LONG TERM GOAL #  5   Title Perform a reciprocating stair gait with one railing with pain not > 2-3/10.    Time 4    Period Weeks    Status On-going                   Plan - 08/24/21 1508     Clinical Impression Statement Patient presented in clinic with reports of mild L knee tightness. Increased edema notable  in L knee but L TED hose still donned. Patient has returned to light desk work at [employer] and more independence. Patient still using CPM like machine at home and is now at 120 deg. Patient able to tolerate light knee extension prop for minimal three minutes. Patient's L knee AROM measured as 5-112 deg today. Normal vasopneumatic response noted following removal of the modality.    Personal Factors and Comorbidities Comorbidity 1;Comorbidity 2;Other    Comorbidities OP, OA, right TKA, hypothyroidism.    Examination-Activity Limitations Other;Locomotion Level;Stairs;Carry    Examination-Participation Restrictions Other    Stability/Clinical Decision Making Stable/Uncomplicated    Rehab Potential Excellent    PT Frequency 3x / week    PT Duration 4 weeks    PT Treatment/Interventions ADLs/Self Care Home Management;Cryotherapy;Electrical Stimulation;Moist Heat;Gait training;Stair training;Functional mobility training;Therapeutic activities;Therapeutic exercise;Neuromuscular re-education;Manual techniques;Patient/family education;Passive range of motion;Vasopneumatic Device    PT Next Visit Plan Progress to stationary bike next treatment.    Consulted and Agree with Plan of Care Patient             Patient will benefit from skilled therapeutic intervention in order to improve the following deficits and impairments:  Abnormal gait, Decreased activity tolerance, Decreased range of motion, Decreased strength, Pain  Visit Diagnosis: Chronic pain of left knee  Stiffness of left knee, not elsewhere classified  Abnormal posture     Problem List Patient Active Problem List   Diagnosis Date Noted   Insomnia 09/23/2020   Dyslipidemia 09/23/2020   RLS (restless legs syndrome) 07/20/2020   Drug side effects, initial encounter 07/20/2020   Coronary atherosclerosis 03/18/2019   Post-traumatic headache, not intractable 12/04/2018   Grief 09/12/2018   Atrophic vaginitis 09/06/2017   Burning  sensation of mouth 09/06/2017   Osteopenia 09/05/2016   Chest pain 03/04/2016   Degenerative arthritis of right knee 05/09/2014   Headache(784.0) 07/19/2013   Tinnitus 07/19/2013   Dry skin dermatitis 08/31/2012   Left hip pain 08/31/2012   Cerumen impaction 08/31/2012   Well adult exam 05/25/2012   Upper abdominal pain-chronic 04/02/2011   Thumb pain 03/11/2011   Donor, blood 03/11/2011   Shoulder pain 03/11/2011   Bilateral primary osteoarthritis of knee 09/10/2010   VERTIGO 03/05/2010   OSA on CPAP 07/22/2008   B12 deficiency 08/16/2007   Vitamin D deficiency 08/16/2007   Nonorganic sleep disorder 05/10/2007   SARCOIDOSIS, PULMONARY 05/09/2007   Hypothyroidism 05/09/2007   ALLERGIC RHINITIS 05/09/2007   GERD 05/09/2007   Personal history of colon polyps 05/09/2007    Standley Brooking, PTA 08/24/2021, 3:19 PM  [facility] [address] [place], [place], [address] Phone: [phone]   Fax:  [phone]  Name: MESHAWN OCONNOR MRN: [id number] Date of Birth: 1940/09/16

## 2021-08-25 ENCOUNTER — Other Ambulatory Visit: Payer: Self-pay | Admitting: Internal Medicine

## 2021-08-26 ENCOUNTER — Other Ambulatory Visit: Payer: Self-pay

## 2021-08-26 ENCOUNTER — Encounter: Payer: Self-pay | Admitting: Physical Therapy

## 2021-08-26 ENCOUNTER — Ambulatory Visit: Payer: Medicare Other | Admitting: Physical Therapy

## 2021-08-26 DIAGNOSIS — M25662 Stiffness of left knee, not elsewhere classified: Secondary | ICD-10-CM

## 2021-08-26 DIAGNOSIS — G8929 Other chronic pain: Secondary | ICD-10-CM | POA: Diagnosis not present

## 2021-08-26 DIAGNOSIS — R293 Abnormal posture: Secondary | ICD-10-CM | POA: Diagnosis not present

## 2021-08-26 DIAGNOSIS — M25562 Pain in left knee: Secondary | ICD-10-CM | POA: Diagnosis not present

## 2021-08-26 NOTE — Therapy (Signed)
[facility] [place], [place], [address] Phone: [phone]   Fax:  [phone]  Physical Therapy Treatment  Patient Details  Name: Elizabeth Gray MRN: [id number] Date of Birth: Sep 12, 1940 Referring Provider (PT): Esmond Plants MD   Encounter Date: 08/26/2021   PT End of Session - 08/26/21 1436     Visit Number 8    Number of Visits 12    Date for PT Re-Evaluation 09/06/21    Authorization Type FOTO AT LEAST EVERY 5TH VISIT.  PROGRESS NOTE AT 10TH VISIT.  KX MODIFIER AFTER 15 VISITS.    PT Start Time 1431    PT Stop Time 1516    PT Time Calculation (min) 45 min    Equipment Utilized During Treatment Other (comment)   FWW   Activity Tolerance Patient tolerated treatment well    Behavior During Therapy WFL for tasks assessed/performed             Past Medical History:  Diagnosis Date   Allergy    Bursitis    Cancer ([place]) 03/22/2017   Melanoma in situ, lentigo maligna type   Deviated septum    External hemorrhoids without mention of complication 8676   Colonoscopy    Fatigue    GERD (gastroesophageal reflux disease)    Headache(784.0)    migraines   Heat rash    under the breasts.Marland Kitchenappeared on monday.Marland KitchenMarland KitchenBurning & itching, uses cortisone   Hypothyroidism    Iron deficiency anemia, unspecified    Lower esophageal ring 2008   EGD   Nonorganic sleep disorder, unspecified    Osteoarthritis    Osteoporosis    Pancreatitis    Personal history of colonic polyps    Polio 1948   Pulmonary sarcoidosis ([place])    Sleep apnea    [facility]   Vitamin B12 deficiency    Vitamin D deficiency     Past Surgical History:  Procedure Laterality Date   [place]  2002   rt   CHOLECYSTECTOMY  2000   COLONOSCOPY  12/19/2006   Multiple diminutive polyps destroyed-removed (hyperpastic), internal hemorrhoids   ESOPHAGOGASTRODUODENOSCOPY  12/19/2006    lower esophageal ring dilated   HAND SURGERY  1997   left   KNEE ARTHROSCOPY Bilateral    multiple 2 on lft 1 on rt   [place]   Melanoma Removal  Right 2018   right face   TOE SURGERY Left    left foot next to little toe  joint rem   TOTAL KNEE ARTHROPLASTY Right 05/09/2014   Procedure: RIGHT TOTAL KNEE ARTHROPLASTY;  Surgeon: [name], MD;  Location: [place];  Service: Orthopedics;  Laterality: Right;   TOTAL KNEE ARTHROPLASTY Left 08/06/2021   Procedure: TOTAL KNEE ARTHROPLASTY;  Surgeon: [name], MD;  [facility];  Service: Orthopedics;  Laterality: Left;   UPPER GASTROINTESTINAL ENDOSCOPY     WISDOM TOOTH EXTRACTION      There were no vitals filed for this visit.   Subjective Assessment - 08/26/21 1433     Subjective COVID-19 screen performed prior to patient entering clinic. Reports that she has had no pain meds so far today and back working at [employer] and hit her knee on a drawer. Walked some around her home without the walker and had some discomfort without the walker.    Pertinent History OP, OA, right TKA, hypothyroidism.  How long can you walk comfortably? Around home wiht walker.    Patient Stated Goals Perform ADL's without left knee pain.    Currently in Pain? Other (Comment)   No pain assessment provided               [facility] PT Assessment - 08/26/21 0001       Assessment   Medical Diagnosis Left total knee replacement    Referring Provider (PT) Esmond Plants MD    Onset Date/Surgical Date 08/06/21    Next MD Visit 09/16/2021      Precautions   Precaution Comments No ultrasound.      Observation/Other Assessments   Observations Patient states that she found a knot along medial L thigh proximally where bruising was from tourniquet      ROM / Strength   AROM / PROM / Strength AROM      AROM   Overall AROM  Within functional limits for tasks performed    AROM Assessment Site Knee    Right/Left Knee Left    Left  Knee Extension 2    Left Knee Flexion 112                           [facility] Adult PT Treatment/Exercise - 08/26/21 0001       Knee/Hip Exercises: Aerobic   Recumbent Bike L1, seat 7 x10 min    Nustep L3, seat 8 x5 min      Knee/Hip Exercises: Standing   Forward Lunges Left;15 reps;3 seconds;Limitations    Forward Lunges Limitations 10 sec holds; 8" step    Lateral Step Up Left;15 reps;Hand Hold: 2;Step Height: 4"    Forward Step Up Left;15 reps;Hand Hold: 2;Step Height: 6"    Step Down Left;15 reps;Hand Hold: 2;Step Height: 4"      Knee/Hip Exercises: Seated   Long Arc Quad Strengthening;Left;2 sets;10 reps;Weights    Long Arc Quad Weight 4 lbs.    Sit to Sand 10 reps;without UE support      Modalities   Modalities Vasopneumatic      Vasopneumatic   Number Minutes Vasopneumatic  10 minutes    Vasopnuematic Location  Knee    Vasopneumatic Pressure Medium    Vasopneumatic Temperature  34                          PT Long Term Goals - 08/24/21 1507       PT LONG TERM GOAL #1   Title independent with a HEP.    Time 4    Period Weeks    Status On-going      PT LONG TERM GOAL #2   Title Full active left knee extension in order to normalize gait.    Time 4    Period Weeks    Status On-going      PT LONG TERM GOAL #3   Title Active knee flexion to 115 degrees+ so the patient can perform functional tasks and do so with pain not > 2-3/10.    Time 4    Period Weeks    Status On-going      PT LONG TERM GOAL #4   Title Increase left hip and knee strength to 5/5 to provide good stability for accomplishment of functional activities    Time 4    Period Weeks    Status On-going      PT LONG TERM GOAL #  5   Title Perform a reciprocating stair gait with one railing with pain not > 2-3/10.    Time 4    Period Weeks    Status On-going                   Plan - 08/26/21 1507     Clinical Impression Statement Patient presented in  clinic with reports of min L knee pain. 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 Patient able to progress well to stationary bike. Patient instructed with all steps to reduce UE involvement. AROM of L knee measured as 2-112 deg. Normal vasopnueumatic response noted following removal of the modality. Bruising along L dorsal foot and proximal toes. All bruising noted as yellowing now.    Personal Factors and Comorbidities Comorbidity 1;Comorbidity 2;Other    Comorbidities OP, OA, right TKA, hypothyroidism.    Examination-Activity Limitations Other;Locomotion Level;Stairs;Carry    Examination-Participation Restrictions Other    Stability/Clinical Decision Making Stable/Uncomplicated    Rehab Potential Excellent    PT Frequency 3x / week    PT Duration 4 weeks    PT Treatment/Interventions ADLs/Self Care Home Management;Cryotherapy;Electrical Stimulation;Moist Heat;Gait training;Stair training;Functional mobility training;Therapeutic activities;Therapeutic exercise;Neuromuscular re-education;Manual techniques;Patient/family education;Passive range of motion;Vasopneumatic Device    PT Next Visit Plan Progress to stationary bike.    Consulted and Agree with Plan of Care Patient             Patient will benefit from skilled therapeutic intervention in order to improve the following deficits and impairments:  Abnormal gait, Decreased activity tolerance, Decreased range of motion, Decreased strength, Pain  Visit Diagnosis: Chronic pain of left knee  Stiffness of left knee, not elsewhere classified  Abnormal posture     Problem List Patient Active Problem List   Diagnosis Date Noted   Insomnia 09/23/2020   Dyslipidemia 09/23/2020   RLS (restless legs syndrome) 07/20/2020   Drug side effects, initial encounter 07/20/2020   Coronary atherosclerosis 03/18/2019   Post-traumatic headache, not intractable 12/04/2018   Grief 09/12/2018   Atrophic vaginitis  09/06/2017   Burning sensation of mouth 09/06/2017   Osteopenia 09/05/2016   Chest pain 03/04/2016   Degenerative arthritis of right knee 05/09/2014   Headache(784.0) 07/19/2013   Tinnitus 07/19/2013   Dry skin dermatitis 08/31/2012   Left hip pain 08/31/2012   Cerumen impaction 08/31/2012   Well adult exam 05/25/2012   Upper abdominal pain-chronic 04/02/2011   Thumb pain 03/11/2011   Donor, blood 03/11/2011   Shoulder pain 03/11/2011   Bilateral primary osteoarthritis of knee 09/10/2010   VERTIGO 03/05/2010   OSA on CPAP 07/22/2008   B12 deficiency 08/16/2007   Vitamin D deficiency 08/16/2007   Nonorganic sleep disorder 05/10/2007   SARCOIDOSIS, PULMONARY 05/09/2007   Hypothyroidism 05/09/2007   ALLERGIC RHINITIS 05/09/2007   GERD 05/09/2007   Personal history of colon polyps 05/09/2007    Standley Brooking, PTA 08/26/2021, 3:24 PM  [facility] [address] [place], [place], [address] Phone: [phone]   Fax:  [phone]  Name: Elizabeth Gray MRN: [id number] Date of Birth: 1941-04-09

## 2021-08-27 DIAGNOSIS — U071 COVID-19: Secondary | ICD-10-CM | POA: Diagnosis not present

## 2021-08-30 ENCOUNTER — Ambulatory Visit: Payer: Medicare Other | Admitting: Physical Therapy

## 2021-08-30 ENCOUNTER — Encounter: Payer: Self-pay | Admitting: Physical Therapy

## 2021-08-30 ENCOUNTER — Other Ambulatory Visit: Payer: Self-pay

## 2021-08-30 DIAGNOSIS — G8929 Other chronic pain: Secondary | ICD-10-CM | POA: Diagnosis not present

## 2021-08-30 DIAGNOSIS — M25662 Stiffness of left knee, not elsewhere classified: Secondary | ICD-10-CM

## 2021-08-30 DIAGNOSIS — M25562 Pain in left knee: Secondary | ICD-10-CM | POA: Diagnosis not present

## 2021-08-30 DIAGNOSIS — R293 Abnormal posture: Secondary | ICD-10-CM

## 2021-08-30 NOTE — Therapy (Signed)
[facility] [place], [place], [address] Phone: [phone]   Fax:  [phone]  Physical Therapy Treatment  Patient Details  Name: Elizabeth Gray MRN: [id number] Date of Birth: [birth year] Referring Provider (PT): Esmond Plants MD   Encounter Date: 08/30/2021   PT End of Session - 08/30/21 1354     Visit Number 9    Number of Visits 12    Date for PT Re-Evaluation 09/06/21    Authorization Type FOTO AT LEAST EVERY 5TH VISIT.  PROGRESS NOTE AT 10TH VISIT.  KX MODIFIER AFTER 15 VISITS.    PT Start Time 1345    PT Stop Time 1434    PT Time Calculation (min) 49 min    Equipment Utilized During Treatment Other (comment)   FWW   Activity Tolerance Patient tolerated treatment well    Behavior During Therapy WFL for tasks assessed/performed             Past Medical History:  Diagnosis Date   Allergy    Bursitis    Cancer ([place]) 03/22/2017   Melanoma in situ, lentigo maligna type   Deviated septum    External hemorrhoids without mention of complication 1950   Colonoscopy    Fatigue    GERD (gastroesophageal reflux disease)    Headache(784.0)    migraines   Heat rash    under the breasts.Marland Kitchenappeared on monday.Marland KitchenMarland KitchenBurning & itching, uses cortisone   Hypothyroidism    Iron deficiency anemia, unspecified    Lower esophageal ring 2008   EGD   Nonorganic sleep disorder, unspecified    Osteoarthritis    Osteoporosis    Pancreatitis    Personal history of colonic polyps    Polio 1948   Pulmonary sarcoidosis ([place])    Sleep apnea    [facility]   Vitamin B12 deficiency    Vitamin D deficiency     Past Surgical History:  Procedure Laterality Date   [place]  2002   rt   CHOLECYSTECTOMY  2000   COLONOSCOPY  12/19/2006   Multiple diminutive polyps destroyed-removed (hyperpastic), internal hemorrhoids   ESOPHAGOGASTRODUODENOSCOPY  12/19/2006    lower esophageal ring dilated   HAND SURGERY  1997   left   KNEE ARTHROSCOPY Bilateral    multiple 2 on lft 1 on rt   [place]   Melanoma Removal  Right 2018   right face   TOE SURGERY Left    left foot next to little toe  joint rem   TOTAL KNEE ARTHROPLASTY Right 05/09/2014   Procedure: RIGHT TOTAL KNEE ARTHROPLASTY;  Surgeon: [name], MD;  Location: [place];  Service: Orthopedics;  Laterality: Right;   TOTAL KNEE ARTHROPLASTY Left 08/06/2021   Procedure: TOTAL KNEE ARTHROPLASTY;  Surgeon: [name], MD;  Location: [facility];  Service: Orthopedics;  Laterality: Left;   UPPER GASTROINTESTINAL ENDOSCOPY     WISDOM TOOTH EXTRACTION      There were no vitals filed for this visit.   Subjective Assessment - 08/30/21 1349     Subjective COVID-19 screen performed prior to patient entering clinic. Reports that she has worked since 7 am at [employer] and has been trying to walk without AD but may have overworked it.    Pertinent History OP, OA, right TKA, hypothyroidism.    How long can you walk comfortably? Around home wiht walker.    Patient Stated  Goals Perform ADL's without left knee pain.    Currently in Pain? Yes    Pain Score --   No pain score provided   Pain Location Knee    Pain Orientation Left    Pain Descriptors / Indicators Discomfort    Pain Type Surgical pain    Pain Onset 1 to 4 weeks ago    Pain Frequency Intermittent                [facility] PT Assessment - 08/30/21 0001       Assessment   Medical Diagnosis Left total knee replacement    Referring Provider (PT) Esmond Plants MD    Onset Date/Surgical Date 08/06/21    Next MD Visit 09/16/2021      Precautions   Precaution Comments No ultrasound.      Restrictions   Weight Bearing Restrictions No                           [facility] Adult PT Treatment/Exercise - 08/30/21 0001       Knee/Hip Exercises: Aerobic   Recumbent Bike L1, seat 5 x10 min    Nustep L3, seat 6  x5 min      Knee/Hip Exercises: Standing   Forward Lunges Left;Limitations;20 reps    Forward Lunges Limitations 3 sec holds; 8" step    Lateral Step Up Left;20 reps;Hand Hold: 2;Step Height: 4"    Forward Step Up Left;20 reps;Hand Hold: 2;Step Height: 6"    Step Down Left;20 reps;Hand Hold: 2;Step Height: 4"    Rocker Board 3 minutes      Knee/Hip Exercises: Seated   Long Arc Quad Strengthening;Left;2 sets;10 reps;Weights    Long Arc Quad Weight 4 lbs.      Modalities   Modalities Vasopneumatic      Vasopneumatic   Number Minutes Vasopneumatic  10 minutes    Vasopnuematic Location  Knee    Vasopneumatic Pressure Medium    Vasopneumatic Temperature  34                          PT Long Term Goals - 08/24/21 1507       PT LONG TERM GOAL #1   Title independent with a HEP.    Time 4    Period Weeks    Status On-going      PT LONG TERM GOAL #2   Title Full active left knee extension in order to normalize gait.    Time 4    Period Weeks    Status On-going      PT LONG TERM GOAL #3   Title Active knee flexion to 115 degrees+ so the patient can perform functional tasks and do so with pain not > 2-3/10.    Time 4    Period Weeks    Status On-going      PT LONG TERM GOAL #4   Title Increase left hip and knee strength to 5/5 to provide good stability for accomplishment of functional activities    Time 4    Period Weeks    Status On-going      PT LONG TERM GOAL #5   Title Perform a reciprocating stair gait with one railing with pain not > 2-3/10.    Time 4    Period Weeks    Status On-going  Plan - 08/30/21 1429     Clinical Impression Statement Patient presented in clinic feeling fatigued since working today and trying to be more independent from AD. Patient reported more knee tightness initially with nustep and stationary bike regarding knee flexion due to discomfort and tightness. Patient progressed in reps for  strengthening and improving activity tolerance. Normal vasopneumatic response noted following removal.    Personal Factors and Comorbidities Comorbidity 1;Comorbidity 2;Other    Comorbidities OP, OA, right TKA, hypothyroidism.    Examination-Activity Limitations Other;Locomotion Level;Stairs;Carry    Examination-Participation Restrictions Other    Stability/Clinical Decision Making Stable/Uncomplicated    Rehab Potential Excellent    PT Frequency 3x / week    PT Duration 4 weeks    PT Treatment/Interventions ADLs/Self Care Home Management;Cryotherapy;Electrical Stimulation;Moist Heat;Gait training;Stair training;Functional mobility training;Therapeutic activities;Therapeutic exercise;Neuromuscular re-education;Manual techniques;Patient/family education;Passive range of motion;Vasopneumatic Device    PT Next Visit Plan Complete goal assessment to decide on timeline of PT.    Consulted and Agree with Plan of Care Patient             Patient will benefit from skilled therapeutic intervention in order to improve the following deficits and impairments:  Abnormal gait, Decreased activity tolerance, Decreased range of motion, Decreased strength, Pain  Visit Diagnosis: Chronic pain of left knee  Stiffness of left knee, not elsewhere classified  Abnormal posture     Problem List Patient Active Problem List   Diagnosis Date Noted   Insomnia 09/23/2020   Dyslipidemia 09/23/2020   RLS (restless legs syndrome) 07/20/2020   Drug side effects, initial encounter 07/20/2020   Coronary atherosclerosis 03/18/2019   Post-traumatic headache, not intractable 12/04/2018   Grief 09/12/2018   Atrophic vaginitis 09/06/2017   Burning sensation of mouth 09/06/2017   Osteopenia 09/05/2016   Chest pain 03/04/2016   Degenerative arthritis of right knee 05/09/2014   Headache(784.0) 07/19/2013   Tinnitus 07/19/2013   Dry skin dermatitis 08/31/2012   Left hip pain 08/31/2012   Cerumen impaction  08/31/2012   Well adult exam 05/25/2012   Upper abdominal pain-chronic 04/02/2011   Thumb pain 03/11/2011   Donor, blood 03/11/2011   Shoulder pain 03/11/2011   Bilateral primary osteoarthritis of knee 09/10/2010   VERTIGO 03/05/2010   OSA on CPAP 07/22/2008   B12 deficiency 08/16/2007   Vitamin D deficiency 08/16/2007   Nonorganic sleep disorder 05/10/2007   SARCOIDOSIS, PULMONARY 05/09/2007   Hypothyroidism 05/09/2007   ALLERGIC RHINITIS 05/09/2007   GERD 05/09/2007   Personal history of colon polyps 05/09/2007    Standley Brooking, PTA 08/30/2021, 2:49 PM  [facility] [address] [place], [place], [address] Phone: [phone]   Fax:  [phone]  Name: Elizabeth Gray MRN: [id number] Date of Birth: June 22, 1941

## 2021-09-01 ENCOUNTER — Encounter: Payer: Self-pay | Admitting: Physical Therapy

## 2021-09-01 ENCOUNTER — Other Ambulatory Visit: Payer: Self-pay

## 2021-09-01 ENCOUNTER — Ambulatory Visit: Payer: Medicare Other | Attending: Orthopedic Surgery | Admitting: Physical Therapy

## 2021-09-01 DIAGNOSIS — R293 Abnormal posture: Secondary | ICD-10-CM | POA: Diagnosis not present

## 2021-09-01 DIAGNOSIS — G8929 Other chronic pain: Secondary | ICD-10-CM | POA: Insufficient documentation

## 2021-09-01 DIAGNOSIS — M542 Cervicalgia: Secondary | ICD-10-CM | POA: Diagnosis not present

## 2021-09-01 DIAGNOSIS — M25662 Stiffness of left knee, not elsewhere classified: Secondary | ICD-10-CM | POA: Insufficient documentation

## 2021-09-01 DIAGNOSIS — M25562 Pain in left knee: Secondary | ICD-10-CM | POA: Insufficient documentation

## 2021-09-01 NOTE — Addendum Note (Signed)
Addended by: Caine Barfield, [place] W on: 09/01/2021 03:29 PM   Modules accepted: Orders

## 2021-09-01 NOTE — Therapy (Signed)
[facility] [place], [place], [address] Phone: [phone]   Fax:  [phone]  Physical Therapy Treatment  Patient Details  Name: Elizabeth Gray MRN: [id number] Date of Birth: 22-Dec-1940 Referring Provider (PT): Esmond Plants MD   Encounter Date: 09/01/2021   PT End of Session - 09/01/21 1349     Visit Number 10    Number of Visits 12    Date for PT Re-Evaluation 09/06/21    Authorization Type FOTO AT LEAST EVERY 5TH VISIT.  PROGRESS NOTE AT 10TH VISIT.  KX MODIFIER AFTER 15 VISITS.    PT Start Time 1340    PT Stop Time 1421    PT Time Calculation (min) 41 min    Activity Tolerance Patient tolerated treatment well    Behavior During Therapy WFL for tasks assessed/performed             Past Medical History:  Diagnosis Date   Allergy    Bursitis    Cancer ([place]) 03/22/2017   Melanoma in situ, lentigo maligna type   Deviated septum    External hemorrhoids without mention of complication 0867   Colonoscopy    Fatigue    GERD (gastroesophageal reflux disease)    Headache(784.0)    migraines   Heat rash    under the breasts.Marland Kitchenappeared on monday.Marland KitchenMarland KitchenBurning & itching, uses cortisone   Hypothyroidism    Iron deficiency anemia, unspecified    Lower esophageal ring 2008   EGD   Nonorganic sleep disorder, unspecified    Osteoarthritis    Osteoporosis    Pancreatitis    Personal history of colonic polyps    Polio 1948   Pulmonary sarcoidosis ([place])    Sleep apnea    [facility]   Vitamin B12 deficiency    Vitamin D deficiency     Past Surgical History:  Procedure Laterality Date   [place]  2002   rt   CHOLECYSTECTOMY  2000   COLONOSCOPY  12/19/2006   Multiple diminutive polyps destroyed-removed (hyperpastic), internal hemorrhoids   ESOPHAGOGASTRODUODENOSCOPY  12/19/2006   lower esophageal ring dilated   HAND SURGERY  1997   left    KNEE ARTHROSCOPY Bilateral    multiple 2 on lft 1 on rt   [place]   Melanoma Removal  Right 2018   right face   TOE SURGERY Left    left foot next to little toe  joint rem   TOTAL KNEE ARTHROPLASTY Right 05/09/2014   Procedure: RIGHT TOTAL KNEE ARTHROPLASTY;  Surgeon: [name], MD;  Location: [place];  Service: Orthopedics;  Laterality: Right;   TOTAL KNEE ARTHROPLASTY Left 08/06/2021   Procedure: TOTAL KNEE ARTHROPLASTY;  Surgeon: [name], MD;  Location: [facility];  Service: Orthopedics;  Laterality: Left;   UPPER GASTROINTESTINAL ENDOSCOPY     WISDOM TOOTH EXTRACTION      There were no vitals filed for this visit.   Subjective Assessment - 09/01/21 1344     Subjective COVID-19 screen performed prior to patient entering clinic. Has not used the walker since yesterday and took off TED hose as it was bothersome.    Pertinent History OP, OA, right TKA, hypothyroidism.    How long can you walk comfortably? Around home wiht walker.    Patient Stated Goals Perform ADL's without left knee pain.    Currently in Pain? No/denies  [facility] PT Assessment - 09/01/21 0001       Assessment   Medical Diagnosis Left total knee replacement    Referring Provider (PT) Esmond Plants MD    Onset Date/Surgical Date 08/06/21    Next MD Visit 09/16/2021      Precautions   Precaution Comments No ultrasound.      Restrictions   Weight Bearing Restrictions No      Observation/Other Assessments   Focus on Therapeutic Outcomes (FOTO)  32% limitation 09/01/2021      ROM / Strength   AROM / PROM / Strength AROM;Strength      AROM   Overall AROM  Within functional limits for tasks performed    AROM Assessment Site Knee    Right/Left Knee Left    Left Knee Extension 0    Left Knee Flexion 116      Strength   Overall Strength Within functional limits for tasks performed    Strength Assessment Site Knee;Hip    Right/Left Hip Left    Left Hip Flexion 4-/5     Right/Left Knee Left    Left Knee Flexion 4+/5    Left Knee Extension 4+/5                           [facility] Adult PT Treatment/Exercise - 09/01/21 0001       Knee/Hip Exercises: Aerobic   Recumbent Bike L3, seat 6-5 x10 min    Nustep L5, seat 6 x5 min      Knee/Hip Exercises: Machines for Strengthening   Cybex Knee Extension 10# x15 reps    Cybex Knee Flexion 30# x15 reps      Knee/Hip Exercises: Standing   Lateral Step Up Left;Hand Hold: 2;Step Height: 6";15 reps    Step Down Left;Hand Hold: 2;Step Height: 4";15 reps      Knee/Hip Exercises: Seated   Sit to Sand 15 reps;without UE support   2" step under RLE for TKE and LLE WB emphasis     Modalities   Modalities Vasopneumatic      Vasopneumatic   Number Minutes Vasopneumatic  10 minutes    Vasopnuematic Location  Knee    Vasopneumatic Pressure Low    Vasopneumatic Temperature  34                          PT Long Term Goals - 09/01/21 1417       PT LONG TERM GOAL #1   Title independent with a HEP.    Time 4    Period Weeks    Status On-going      PT LONG TERM GOAL #2   Title Full active left knee extension in order to normalize gait.    Time 4    Period Weeks    Status Achieved      PT LONG TERM GOAL #3   Title Active knee flexion to 115 degrees+ so the patient can perform functional tasks and do so with pain not > 2-3/10.    Time 4    Period Weeks    Status Achieved      PT LONG TERM GOAL #4   Title Increase left hip and knee strength to 5/5 to provide good stability for accomplishment of functional activities    Time 4    Period Weeks    Status On-going      PT LONG TERM GOAL #5  Title Perform a reciprocating stair gait with one railing with pain not > 2-3/10.    Time 4    Period Weeks    Status On-going                   Plan - 09/01/21 1418     Clinical Impression Statement Patient presented in clinic with reports of no pain and no use of AD.  Patient has shown great functional improvement but with MMT assessment hip weakness notable. Patient is still unable to complete stairs reciprically due to insecurity. Patient has achieved LTGs regarding ROM at this time. Normal vasopneumatic response noted following removal of the modality.    Personal Factors and Comorbidities Comorbidity 1;Comorbidity 2;Other    Comorbidities OP, OA, right TKA, hypothyroidism.    Examination-Activity Limitations Other;Locomotion Level;Stairs;Carry    Examination-Participation Restrictions Other    Stability/Clinical Decision Making Stable/Uncomplicated    Rehab Potential Excellent    PT Frequency 3x / week    PT Duration 4 weeks    PT Treatment/Interventions ADLs/Self Care Home Management;Cryotherapy;Electrical Stimulation;Moist Heat;Gait training;Stair training;Functional mobility training;Therapeutic activities;Therapeutic exercise;Neuromuscular re-education;Manual techniques;Patient/family education;Passive range of motion;Vasopneumatic Device    PT Next Visit Plan Complete hip strengthening and balance training    Consulted and Agree with Plan of Care Patient             Patient will benefit from skilled therapeutic intervention in order to improve the following deficits and impairments:  Abnormal gait, Decreased activity tolerance, Decreased range of motion, Decreased strength, Pain  Visit Diagnosis: Chronic pain of left knee  Stiffness of left knee, not elsewhere classified  Abnormal posture     Problem List Patient Active Problem List   Diagnosis Date Noted   Insomnia 09/23/2020   Dyslipidemia 09/23/2020   RLS (restless legs syndrome) 07/20/2020   Drug side effects, initial encounter 07/20/2020   Coronary atherosclerosis 03/18/2019   Post-traumatic headache, not intractable 12/04/2018   Grief 09/12/2018   Atrophic vaginitis 09/06/2017   Burning sensation of mouth 09/06/2017   Osteopenia 09/05/2016   Chest pain 03/04/2016    Degenerative arthritis of right knee 05/09/2014   Headache(784.0) 07/19/2013   Tinnitus 07/19/2013   Dry skin dermatitis 08/31/2012   Left hip pain 08/31/2012   Cerumen impaction 08/31/2012   Well adult exam 05/25/2012   Upper abdominal pain-chronic 04/02/2011   Thumb pain 03/11/2011   Donor, blood 03/11/2011   Shoulder pain 03/11/2011   Bilateral primary osteoarthritis of knee 09/10/2010   VERTIGO 03/05/2010   OSA on CPAP 07/22/2008   B12 deficiency 08/16/2007   Vitamin D deficiency 08/16/2007   Nonorganic sleep disorder 05/10/2007   SARCOIDOSIS, PULMONARY 05/09/2007   Hypothyroidism 05/09/2007   ALLERGIC RHINITIS 05/09/2007   GERD 05/09/2007   Personal history of colon polyps 05/09/2007    Standley Brooking, PTA 09/01/2021, 2:38 PM  [facility] [address] [place], [place], [address] Phone: [phone]   Fax:  [phone]  Name: Elizabeth Gray MRN: [id number] Date of Birth: [birth year]

## 2021-09-06 ENCOUNTER — Other Ambulatory Visit: Payer: Self-pay

## 2021-09-06 ENCOUNTER — Encounter: Payer: Self-pay | Admitting: Physical Therapy

## 2021-09-06 ENCOUNTER — Ambulatory Visit: Payer: Medicare Other | Admitting: Physical Therapy

## 2021-09-06 DIAGNOSIS — R293 Abnormal posture: Secondary | ICD-10-CM | POA: Diagnosis not present

## 2021-09-06 DIAGNOSIS — G8929 Other chronic pain: Secondary | ICD-10-CM | POA: Diagnosis not present

## 2021-09-06 DIAGNOSIS — M25562 Pain in left knee: Secondary | ICD-10-CM | POA: Diagnosis not present

## 2021-09-06 DIAGNOSIS — M542 Cervicalgia: Secondary | ICD-10-CM | POA: Diagnosis not present

## 2021-09-06 DIAGNOSIS — M25662 Stiffness of left knee, not elsewhere classified: Secondary | ICD-10-CM | POA: Diagnosis not present

## 2021-09-06 NOTE — Therapy (Signed)
[facility] [place], [place], [address] Phone: [phone]   Fax:  [phone]  Physical Therapy Treatment  Patient Details  Name: Elizabeth Gray MRN: [id number] Date of Birth: 08/21/1940 Referring Provider (PT): Esmond Plants MD   Encounter Date: 09/06/2021   PT End of Session - 09/06/21 1442     Visit Number 11    Number of Visits 16    Date for PT Re-Evaluation 09/20/21    Authorization Type FOTO AT LEAST EVERY 5TH VISIT.  PROGRESS NOTE AT 10TH VISIT.  KX MODIFIER AFTER 15 VISITS.    PT Start Time 1430    PT Stop Time 1518    PT Time Calculation (min) 48 min    Activity Tolerance Patient tolerated treatment well    Behavior During Therapy WFL for tasks assessed/performed             Past Medical History:  Diagnosis Date   Allergy    Bursitis    Cancer ([place]) 03/22/2017   Melanoma in situ, lentigo maligna type   Deviated septum    External hemorrhoids without mention of complication 4818   Colonoscopy    Fatigue    GERD (gastroesophageal reflux disease)    Headache(784.0)    migraines   Heat rash    under the breasts.Marland Kitchenappeared on monday.Marland KitchenMarland KitchenBurning & itching, uses cortisone   Hypothyroidism    Iron deficiency anemia, unspecified    Lower esophageal ring 2008   EGD   Nonorganic sleep disorder, unspecified    Osteoarthritis    Osteoporosis    Pancreatitis    Personal history of colonic polyps    Polio 1948   Pulmonary sarcoidosis ([place])    Sleep apnea    [facility]   Vitamin B12 deficiency    Vitamin D deficiency     Past Surgical History:  Procedure Laterality Date   [place]  2002   rt   CHOLECYSTECTOMY  2000   COLONOSCOPY  12/19/2006   Multiple diminutive polyps destroyed-removed (hyperpastic), internal hemorrhoids   ESOPHAGOGASTRODUODENOSCOPY  12/19/2006   lower esophageal ring dilated   HAND SURGERY  1997   left    KNEE ARTHROSCOPY Bilateral    multiple 2 on lft 1 on rt   [place]   Melanoma Removal  Right 2018   right face   TOE SURGERY Left    left foot next to little toe  joint rem   TOTAL KNEE ARTHROPLASTY Right 05/09/2014   Procedure: RIGHT TOTAL KNEE ARTHROPLASTY;  Surgeon: [name], MD;  Location: [facility];  Service: Orthopedics;  Laterality: Right;   TOTAL KNEE ARTHROPLASTY Left 08/06/2021   Procedure: TOTAL KNEE ARTHROPLASTY;  Surgeon: [name], MD;  Location: [facility];  Service: Orthopedics;  Laterality: Left;   UPPER GASTROINTESTINAL ENDOSCOPY     WISDOM TOOTH EXTRACTION      There were no vitals filed for this visit.   Subjective Assessment - 09/06/21 1438     Subjective COVID-19 screen performed prior to patient entering clinic. Reports that she has knee pain everyday but today especially. Patient reports that she was having posterior knee spasms.    Pertinent History OP, OA, right TKA, hypothyroidism.    How long can you walk comfortably? Around home wiht walker.    Patient Stated Goals Perform ADL's without left knee pain.    Currently in Pain? Yes  Pain Score 5     Pain Location Knee    Pain Orientation Left    Pain Descriptors / Indicators Sore    Pain Type Surgical pain    Pain Onset 1 to 4 weeks ago    Pain Frequency Intermittent                [facility] PT Assessment - 09/06/21 0001       Assessment   Medical Diagnosis Left total knee replacement    Referring Provider (PT) Esmond Plants MD    Onset Date/Surgical Date 08/06/21    Next MD Visit 09/16/2021      Precautions   Precaution Comments No ultrasound.                           [facility] Adult PT Treatment/Exercise - 09/06/21 0001       Knee/Hip Exercises: Aerobic   Recumbent Bike L3, seat 6-5 x10 min    Nustep L5, seat 6 x5 min      Knee/Hip Exercises: Machines for Strengthening   Cybex Knee Extension 10# x20 reps    Cybex Knee Flexion 20# x20 reps       Knee/Hip Exercises: Standing   Forward Lunges Left;Limitations;20 reps    Forward Lunges Limitations 3 sec holds; 8" step    Step Down Left;Hand Hold: 2;15 reps    Rocker Board 3 minutes      Modalities   Modalities Vasopneumatic      Vasopneumatic   Number Minutes Vasopneumatic  10 minutes    Vasopnuematic Location  Knee    Vasopneumatic Pressure Low    Vasopneumatic Temperature  34                          PT Long Term Goals - 09/01/21 1417       PT LONG TERM GOAL #1   Title independent with a HEP.    Time 4    Period Weeks    Status On-going      PT LONG TERM GOAL #2   Title Full active left knee extension in order to normalize gait.    Time 4    Period Weeks    Status Achieved      PT LONG TERM GOAL #3   Title Active knee flexion to 115 degrees+ so the patient can perform functional tasks and do so with pain not > 2-3/10.    Time 4    Period Weeks    Status Achieved      PT LONG TERM GOAL #4   Title Increase left hip and knee strength to 5/5 to provide good stability for accomplishment of functional activities    Time 4    Period Weeks    Status On-going      PT LONG TERM GOAL #5   Title Perform a reciprocating stair gait with one railing with pain not > 2-3/10.    Time 4    Period Weeks    Status On-going                   Plan - 09/06/21 1527     Clinical Impression Statement Patient presented in clinic with reports of soreness. Patient has a four day a week job in [employer] on concrete floors that may be causing soreness. Patient to pick up spasms medication today. Patient progressed through more strengthening but  not excessive to avoid a greater flare up. Normal vasopnuematic response noted following removal of the modality.    Personal Factors and Comorbidities Comorbidity 1;Comorbidity 2;Other    Comorbidities OP, OA, right TKA, hypothyroidism.    Examination-Activity Limitations Other;Locomotion  Level;Stairs;Carry    Examination-Participation Restrictions Other    Stability/Clinical Decision Making Stable/Uncomplicated    Rehab Potential Excellent    PT Frequency 3x / week    PT Duration 4 weeks    PT Treatment/Interventions ADLs/Self Care Home Management;Cryotherapy;Electrical Stimulation;Moist Heat;Gait training;Stair training;Functional mobility training;Therapeutic activities;Therapeutic exercise;Neuromuscular re-education;Manual techniques;Patient/family education;Passive range of motion;Vasopneumatic Device    PT Next Visit Plan Complete hip strengthening and balance training    Consulted and Agree with Plan of Care Patient             Patient will benefit from skilled therapeutic intervention in order to improve the following deficits and impairments:  Abnormal gait, Decreased activity tolerance, Decreased range of motion, Decreased strength, Pain  Visit Diagnosis: Chronic pain of left knee  Stiffness of left knee, not elsewhere classified  Abnormal posture     Problem List Patient Active Problem List   Diagnosis Date Noted   Insomnia 09/23/2020   Dyslipidemia 09/23/2020   RLS (restless legs syndrome) 07/20/2020   Drug side effects, initial encounter 07/20/2020   Coronary atherosclerosis 03/18/2019   Post-traumatic headache, not intractable 12/04/2018   Grief 09/12/2018   Atrophic vaginitis 09/06/2017   Burning sensation of mouth 09/06/2017   Osteopenia 09/05/2016   Chest pain 03/04/2016   Degenerative arthritis of right knee 05/09/2014   Headache(784.0) 07/19/2013   Tinnitus 07/19/2013   Dry skin dermatitis 08/31/2012   Left hip pain 08/31/2012   Cerumen impaction 08/31/2012   Well adult exam 05/25/2012   Upper abdominal pain-chronic 04/02/2011   Thumb pain 03/11/2011   Donor, blood 03/11/2011   Shoulder pain 03/11/2011   Bilateral primary osteoarthritis of knee 09/10/2010   VERTIGO 03/05/2010   OSA on CPAP 07/22/2008   B12 deficiency  08/16/2007   Vitamin D deficiency 08/16/2007   Nonorganic sleep disorder 05/10/2007   SARCOIDOSIS, PULMONARY 05/09/2007   Hypothyroidism 05/09/2007   ALLERGIC RHINITIS 05/09/2007   GERD 05/09/2007   Personal history of colon polyps 05/09/2007    Standley Brooking, PTA 09/06/2021, 3:40 PM  [facility] [address] [place], [place], [address] Phone: [phone]   Fax:  [phone]  Name: GERDA YIN MRN: [id number] Date of Birth: 1941/07/01

## 2021-09-08 ENCOUNTER — Other Ambulatory Visit: Payer: Self-pay

## 2021-09-08 ENCOUNTER — Ambulatory Visit: Payer: Medicare Other | Admitting: Physical Therapy

## 2021-09-08 ENCOUNTER — Encounter: Payer: Self-pay | Admitting: Physical Therapy

## 2021-09-08 DIAGNOSIS — G8929 Other chronic pain: Secondary | ICD-10-CM | POA: Diagnosis not present

## 2021-09-08 DIAGNOSIS — R293 Abnormal posture: Secondary | ICD-10-CM | POA: Diagnosis not present

## 2021-09-08 DIAGNOSIS — M25562 Pain in left knee: Secondary | ICD-10-CM | POA: Diagnosis not present

## 2021-09-08 DIAGNOSIS — M25662 Stiffness of left knee, not elsewhere classified: Secondary | ICD-10-CM

## 2021-09-08 DIAGNOSIS — M542 Cervicalgia: Secondary | ICD-10-CM | POA: Diagnosis not present

## 2021-09-08 NOTE — Patient Instructions (Signed)
Access Code: [email or web address] URL: [email or web address] Date: 09/08/2021 Prepared by: Almyra Free  Exercises Gastroc Stretch on Wall - 2 x daily - 7 x weekly - 1 sets - 3 reps - 30-60 sec hold Soleus Stretch on Wall - 2 x daily - 7 x weekly - 1 sets - 3 reps - 30-60 sec hold

## 2021-09-08 NOTE — Therapy (Signed)
[facility] [place], [place], [address] Phone: [phone]   Fax:  [phone]  Physical Therapy Treatment  Patient Details  Name: Elizabeth Gray MRN: [id number] Date of Birth: Dec 06, 1940 Referring Provider (PT): Esmond Plants MD   Encounter Date: 09/08/2021   PT End of Session - 09/08/21 1347     Visit Number 12    Number of Visits 16    Date for PT Re-Evaluation 09/20/21    Authorization Type FOTO AT LEAST EVERY 5TH VISIT.  PROGRESS NOTE AT 10TH VISIT.  KX MODIFIER AFTER 15 VISITS.    PT Start Time 1345    PT Stop Time 1441    PT Time Calculation (min) 56 min    Activity Tolerance Patient tolerated treatment well    Behavior During Therapy WFL for tasks assessed/performed             Past Medical History:  Diagnosis Date   Allergy    Bursitis    Cancer ([place]) 03/22/2017   Melanoma in situ, lentigo maligna type   Deviated septum    External hemorrhoids without mention of complication 7026   Colonoscopy    Fatigue    GERD (gastroesophageal reflux disease)    Headache(784.0)    migraines   Heat rash    under the breasts.Marland Kitchenappeared on monday.Marland KitchenMarland KitchenBurning & itching, uses cortisone   Hypothyroidism    Iron deficiency anemia, unspecified    Lower esophageal ring 2008   EGD   Nonorganic sleep disorder, unspecified    Osteoarthritis    Osteoporosis    Pancreatitis    Personal history of colonic polyps    Polio 1948   Pulmonary sarcoidosis ([place])    Sleep apnea    [facility]   Vitamin B12 deficiency    Vitamin D deficiency     Past Surgical History:  Procedure Laterality Date   [place]  2002   rt   CHOLECYSTECTOMY  2000   COLONOSCOPY  12/19/2006   Multiple diminutive polyps destroyed-removed (hyperpastic), internal hemorrhoids   ESOPHAGOGASTRODUODENOSCOPY  12/19/2006   lower esophageal ring dilated   HAND SURGERY  1997   left    KNEE ARTHROSCOPY Bilateral    multiple 2 on lft 1 on rt   [place]   Melanoma Removal  Right 2018   right face   TOE SURGERY Left    left foot next to little toe  joint rem   TOTAL KNEE ARTHROPLASTY Right 05/09/2014   Procedure: RIGHT TOTAL KNEE ARTHROPLASTY;  Surgeon: [name], MD;  Location: [facility];  Service: Orthopedics;  Laterality: Right;   TOTAL KNEE ARTHROPLASTY Left 08/06/2021   Procedure: TOTAL KNEE ARTHROPLASTY;  Surgeon: [name], MD;  Location[facility];  Service: Orthopedics;  Laterality: Left;   UPPER GASTROINTESTINAL ENDOSCOPY     WISDOM TOOTH EXTRACTION      There were no vitals filed for this visit.   Subjective Assessment - 09/08/21 1348     Subjective COVID-19 screen performed prior to patient entering clinic. Reports marked  knee spasms yesterday evening to the point it made her cry. Took meds and then helped her sleep through the night. worst pain is medial knee, but also occurs laterally and into shin.    Pertinent History OP, OA, right TKA, hypothyroidism.    Patient Stated Goals Perform ADL's without left knee pain.    Currently in  Pain? Yes    Pain Score 3     Pain Location Knee    Pain Orientation Left    Pain Descriptors / Indicators Sore    Pain Type Surgical pain                               [facility] Adult PT Treatment/Exercise - 09/08/21 0001       Knee/Hip Exercises: Stretches   Gastroc Stretch Left;1 rep;30 seconds    Soleus Stretch Left;1 rep;30 seconds      Knee/Hip Exercises: Aerobic   Recumbent Bike L4 x 10 min    Nustep L7 x 24min      Modalities   Modalities Vasopneumatic      Vasopneumatic   Number Minutes Vasopneumatic  10 minutes    Vasopnuematic Location  Knee    Vasopneumatic Pressure Low    Vasopneumatic Temperature  34      Manual Therapy   Manual Therapy Soft tissue mobilization    Soft tissue mobilization IASTM and STM to left ant tib, peroneals, lateral gastroc; left hip  ADDuctors, RF and lateral quads                     PT Education - 09/08/21 1444     Education Details gastroc/soleus stretch    Person(s) Educated Patient    Methods Explanation;Demonstration;Handout    Comprehension Verbalized understanding;Returned demonstration                 PT Long Term Goals - 09/01/21 1417       PT LONG TERM GOAL #1   Title independent with a HEP.    Time 4    Period Weeks    Status On-going      PT LONG TERM GOAL #2   Title Full active left knee extension in order to normalize gait.    Time 4    Period Weeks    Status Achieved      PT LONG TERM GOAL #3   Title Active knee flexion to 115 degrees+ so the patient can perform functional tasks and do so with pain not > 2-3/10.    Time 4    Period Weeks    Status Achieved      PT LONG TERM GOAL #4   Title Increase left hip and knee strength to 5/5 to provide good stability for accomplishment of functional activities    Time 4    Period Weeks    Status On-going      PT LONG TERM GOAL #5   Title Perform a reciprocating stair gait with one railing with pain not > 2-3/10.    Time 4    Period Weeks    Status On-going                   Plan - 09/08/21 1355     Clinical Impression Statement Pt continues to report spasms in her left knee medially and laterally. Meds did help her sleep last night. Since this has been an issue we focused on STM today with good response. Patient reported increased ankle flexibility and decreased pain in lower leg muscles at end of session. PT advised pt to do gastroc/soleus stretches regularly.    Personal Factors and Comorbidities Comorbidity 1;Comorbidity 2;Other    Comorbidities OP, OA, right TKA, hypothyroidism.    PT Treatment/Interventions ADLs/Self Care Home Management;Cryotherapy;Electrical Stimulation;Moist Heat;Gait training;Stair training;Functional  mobility training;Therapeutic activities;Therapeutic exercise;Neuromuscular  re-education;Manual techniques;Patient/family education;Passive range of motion;Vasopneumatic Device;Visual/perceptual remediation/compensation    PT Next Visit Plan Complete hip strengthening and balance training    PT Home Exercise Plan [email or web address]  URL: [email or web address]  Date: 09/08/2021  Prepared by: Almyra Free     Exercises  Gastroc Stretch on Wall - 2 x daily - 7 x weekly - 1 sets - 3 reps - 30-60 sec hold  Soleus Stretch on Wall - 2 x daily - 7 x weekly - 1 sets - 3 reps - 30-60 sec hold    Consulted and Agree with Plan of Care Patient             Patient will benefit from skilled therapeutic intervention in order to improve the following deficits and impairments:  Abnormal gait, Decreased activity tolerance, Decreased range of motion, Decreased strength, Pain  Visit Diagnosis: Chronic pain of left knee  Stiffness of left knee, not elsewhere classified  Abnormal posture  Cervicalgia     Problem List Patient Active Problem List   Diagnosis Date Noted   Insomnia 09/23/2020   Dyslipidemia 09/23/2020   RLS (restless legs syndrome) 07/20/2020   Drug side effects, initial encounter 07/20/2020   Coronary atherosclerosis 03/18/2019   Post-traumatic headache, not intractable 12/04/2018   Grief 09/12/2018   Atrophic vaginitis 09/06/2017   Burning sensation of mouth 09/06/2017   Osteopenia 09/05/2016   Chest pain 03/04/2016   Degenerative arthritis of right knee 05/09/2014   Headache(784.0) 07/19/2013   Tinnitus 07/19/2013   Dry skin dermatitis 08/31/2012   Left hip pain 08/31/2012   Cerumen impaction 08/31/2012   Well adult exam 05/25/2012   Upper abdominal pain-chronic 04/02/2011   Thumb pain 03/11/2011   Donor, blood 03/11/2011   Shoulder pain 03/11/2011   Bilateral primary osteoarthritis of knee 09/10/2010   VERTIGO 03/05/2010   OSA on CPAP 07/22/2008   B12 deficiency 08/16/2007   Vitamin D deficiency 08/16/2007   Nonorganic sleep disorder 05/10/2007    SARCOIDOSIS, PULMONARY 05/09/2007   Hypothyroidism 05/09/2007   ALLERGIC RHINITIS 05/09/2007   GERD 05/09/2007   Personal history of colon polyps 05/09/2007   Madelyn Flavors, PT 09/08/2021, 2:45 PM  [facility] [address] [place], [place], [address] Phone: [phone]   Fax:  [phone]  Name: PRIA KLOSINSKI MRN: [id number] Date of Birth: [birth year]

## 2021-09-13 ENCOUNTER — Ambulatory Visit: Payer: Medicare Other | Admitting: Physical Therapy

## 2021-09-15 ENCOUNTER — Encounter: Payer: Self-pay | Admitting: Physical Therapy

## 2021-09-15 ENCOUNTER — Other Ambulatory Visit: Payer: Self-pay

## 2021-09-15 ENCOUNTER — Ambulatory Visit: Payer: Medicare Other | Admitting: Physical Therapy

## 2021-09-15 DIAGNOSIS — M542 Cervicalgia: Secondary | ICD-10-CM | POA: Diagnosis not present

## 2021-09-15 DIAGNOSIS — M25662 Stiffness of left knee, not elsewhere classified: Secondary | ICD-10-CM

## 2021-09-15 DIAGNOSIS — G8929 Other chronic pain: Secondary | ICD-10-CM

## 2021-09-15 DIAGNOSIS — R293 Abnormal posture: Secondary | ICD-10-CM | POA: Diagnosis not present

## 2021-09-15 DIAGNOSIS — M25562 Pain in left knee: Secondary | ICD-10-CM | POA: Diagnosis not present

## 2021-09-15 NOTE — Therapy (Signed)
[facility] [place], [place], [address] Phone: [phone]   Fax:  [phone]  Physical Therapy Treatment  Patient Details  Name: Elizabeth Gray MRN: [id number] Date of Birth: 1941-04-29 Referring Provider (PT): Esmond Plants MD   Encounter Date: 09/15/2021   PT End of Session - 09/15/21 1358     Visit Number 13    Number of Visits 16    Date for PT Re-Evaluation 09/20/21    Authorization Type FOTO AT LEAST EVERY 5TH VISIT.  PROGRESS NOTE AT 10TH VISIT.  KX MODIFIER AFTER 15 VISITS.    PT Start Time 1346    PT Stop Time 1427    PT Time Calculation (min) 41 min    Activity Tolerance Patient tolerated treatment well    Behavior During Therapy WFL for tasks assessed/performed             Past Medical History:  Diagnosis Date   Allergy    Bursitis    Cancer ([place]) 03/22/2017   Melanoma in situ, lentigo maligna type   Deviated septum    External hemorrhoids without mention of complication 6384   Colonoscopy    Fatigue    GERD (gastroesophageal reflux disease)    Headache(784.0)    migraines   Heat rash    under the breasts.Marland Kitchenappeared on monday.Marland KitchenMarland KitchenBurning & itching, uses cortisone   Hypothyroidism    Iron deficiency anemia, unspecified    Lower esophageal ring 2008   EGD   Nonorganic sleep disorder, unspecified    Osteoarthritis    Osteoporosis    Pancreatitis    Personal history of colonic polyps    Polio 1948   Pulmonary sarcoidosis ([place])    Sleep apnea    [facility]   Vitamin B12 deficiency    Vitamin D deficiency     Past Surgical History:  Procedure Laterality Date   [place]  2002   rt   CHOLECYSTECTOMY  2000   COLONOSCOPY  12/19/2006   Multiple diminutive polyps destroyed-removed (hyperpastic), internal hemorrhoids   ESOPHAGOGASTRODUODENOSCOPY  12/19/2006   lower esophageal ring dilated   HAND SURGERY  1997   left    KNEE ARTHROSCOPY Bilateral    multiple 2 on lft 1 on rt   [place]   Melanoma Removal  Right 2018   right face   TOE SURGERY Left    left foot next to little toe  joint rem   TOTAL KNEE ARTHROPLASTY Right 05/09/2014   Procedure: RIGHT TOTAL KNEE ARTHROPLASTY;  Surgeon: [name], MD;  Location: [facility];  Service: Orthopedics;  Laterality: Right;   TOTAL KNEE ARTHROPLASTY Left 08/06/2021   Procedure: TOTAL KNEE ARTHROPLASTY;  Surgeon: [name], MD;  Location: [facility];  Service: Orthopedics;  Laterality: Left;   UPPER GASTROINTESTINAL ENDOSCOPY     WISDOM TOOTH EXTRACTION      There were no vitals filed for this visit.   Subjective Assessment - 09/15/21 1348     Subjective COVID-19 screen performed prior to patient entering clinic. Reports that her LBP is better today but feeling tired overall.    Pertinent History OP, OA, right TKA, hypothyroidism.    How long can you walk comfortably? Around home wiht walker.    Patient Stated Goals Perform ADL's without left knee pain.    Currently in Pain? Yes    Pain Score 3  Pain Location Knee    Pain Orientation Left    Pain Descriptors / Indicators Discomfort    Pain Type Surgical pain    Pain Onset More than a month ago    Pain Frequency Intermittent                [facility] PT Assessment - 09/15/21 0001       Assessment   Medical Diagnosis Left total knee replacement    Referring Provider (PT) Esmond Plants MD    Onset Date/Surgical Date 08/06/21    Next MD Visit 09/16/2021      Precautions   Precaution Comments No ultrasound.                           [facility] Adult PT Treatment/Exercise - 09/15/21 0001       Knee/Hip Exercises: Aerobic   Recumbent Bike L4 x 10 min    Nustep L5 x 36mn      Knee/Hip Exercises: Machines for Strengthening   Cybex Knee Extension 10# x15 reps    Cybex Knee Flexion 30# x15 reps      Knee/Hip Exercises: Standing   Lateral Step Up Left;Hand Hold: 2;15  reps;Step Height: 4"    Step Down Left;Hand Hold: 2;15 reps    Rocker Board 3 minutes      Modalities   Modalities Vasopneumatic      Vasopneumatic   Number Minutes Vasopneumatic  10 minutes    Vasopnuematic Location  Knee    Vasopneumatic Pressure Low    Vasopneumatic Temperature  34                          PT Long Term Goals - 09/15/21 1436       PT LONG TERM GOAL #1   Title independent with a HEP.    Time 4    Period Weeks    Status On-going      PT LONG TERM GOAL #2   Title Full active left knee extension in order to normalize gait.    Time 4    Period Weeks    Status Achieved      PT LONG TERM GOAL #3   Title Active knee flexion to 115 degrees+ so the patient can perform functional tasks and do so with pain not > 2-3/10.    Time 4    Period Weeks    Status Achieved      PT LONG TERM GOAL #4   Title Increase left hip and knee strength to 5/5 to provide good stability for accomplishment of functional activities    Time 4    Period Weeks    Status On-going      PT LONG TERM GOAL #5   Title Perform a reciprocating stair gait with one railing with pain not > 2-3/10.    Time 4    Period Weeks    Status Partially Met                   Plan - 09/15/21 1434     Clinical Impression Statement Much lighter session today due to patient's flare of LBP started on Monday 09/13/2021. Patient was overall feeling some better but still hesistant to avoid increased pain. Machine strengthening limited by muscle fatigue with LLE only. Normal vasopneumatic response noted following removal of the modalities. Patient reporting that she can maneuver stairs mostly reciprically at home.  Personal Factors and Comorbidities Comorbidity 1;Comorbidity 2;Other    Comorbidities OP, OA, right TKA, hypothyroidism.    Examination-Activity Limitations Other;Locomotion Level;Stairs;Carry    Examination-Participation Restrictions Other    Stability/Clinical Decision  Making Stable/Uncomplicated    Rehab Potential Excellent    PT Frequency 3x / week    PT Duration 4 weeks    PT Treatment/Interventions ADLs/Self Care Home Management;Cryotherapy;Electrical Stimulation;Moist Heat;Gait training;Stair training;Functional mobility training;Therapeutic activities;Therapeutic exercise;Neuromuscular re-education;Manual techniques;Patient/family education;Passive range of motion;Vasopneumatic Device;Visual/perceptual remediation/compensation    PT Next Visit Plan Complete hip strengthening and balance training    PT Home Exercise Plan [email or web address]  URL: [email or web address]  Date: 09/08/2021  Prepared by: Almyra Free     Exercises  Gastroc Stretch on Wall - 2 x daily - 7 x weekly - 1 sets - 3 reps - 30-60 sec hold  Soleus Stretch on Wall - 2 x daily - 7 x weekly - 1 sets - 3 reps - 30-60 sec hold    Consulted and Agree with Plan of Care Patient             Patient will benefit from skilled therapeutic intervention in order to improve the following deficits and impairments:  Abnormal gait, Decreased activity tolerance, Decreased range of motion, Decreased strength, Pain  Visit Diagnosis: Chronic pain of left knee  Stiffness of left knee, not elsewhere classified     Problem List Patient Active Problem List   Diagnosis Date Noted   Insomnia 09/23/2020   Dyslipidemia 09/23/2020   RLS (restless legs syndrome) 07/20/2020   Drug side effects, initial encounter 07/20/2020   Coronary atherosclerosis 03/18/2019   Post-traumatic headache, not intractable 12/04/2018   Grief 09/12/2018   Atrophic vaginitis 09/06/2017   Burning sensation of mouth 09/06/2017   Osteopenia 09/05/2016   Chest pain 03/04/2016   Degenerative arthritis of right knee 05/09/2014   Headache(784.0) 07/19/2013   Tinnitus 07/19/2013   Dry skin dermatitis 08/31/2012   Left hip pain 08/31/2012   Cerumen impaction 08/31/2012   Well adult exam 05/25/2012   Upper abdominal pain-chronic  04/02/2011   Thumb pain 03/11/2011   Donor, blood 03/11/2011   Shoulder pain 03/11/2011   Bilateral primary osteoarthritis of knee 09/10/2010   VERTIGO 03/05/2010   OSA on CPAP 07/22/2008   B12 deficiency 08/16/2007   Vitamin D deficiency 08/16/2007   Nonorganic sleep disorder 05/10/2007   SARCOIDOSIS, PULMONARY 05/09/2007   Hypothyroidism 05/09/2007   ALLERGIC RHINITIS 05/09/2007   GERD 05/09/2007   Personal history of colon polyps 05/09/2007    Standley Brooking, PTA 09/15/2021, 2:37 PM  [facility] [address] [place], [place], [address] Phone: [phone]   Fax:  [phone]  Name: Elizabeth Gray MRN: [id number] Date of Birth: [birth year]

## 2021-09-16 DIAGNOSIS — M25512 Pain in left shoulder: Secondary | ICD-10-CM | POA: Diagnosis not present

## 2021-09-16 DIAGNOSIS — M545 Low back pain, unspecified: Secondary | ICD-10-CM | POA: Diagnosis not present

## 2021-09-17 ENCOUNTER — Encounter: Payer: Self-pay | Admitting: Physical Therapy

## 2021-09-17 ENCOUNTER — Ambulatory Visit: Payer: Medicare Other | Admitting: Physical Therapy

## 2021-09-17 ENCOUNTER — Other Ambulatory Visit: Payer: Self-pay

## 2021-09-17 DIAGNOSIS — M25662 Stiffness of left knee, not elsewhere classified: Secondary | ICD-10-CM | POA: Diagnosis not present

## 2021-09-17 DIAGNOSIS — G8929 Other chronic pain: Secondary | ICD-10-CM | POA: Diagnosis not present

## 2021-09-17 DIAGNOSIS — M25562 Pain in left knee: Secondary | ICD-10-CM | POA: Diagnosis not present

## 2021-09-17 DIAGNOSIS — M542 Cervicalgia: Secondary | ICD-10-CM | POA: Diagnosis not present

## 2021-09-17 DIAGNOSIS — R293 Abnormal posture: Secondary | ICD-10-CM | POA: Diagnosis not present

## 2021-09-17 NOTE — Therapy (Signed)
[facility] [place], [place], [address] Phone: [phone]   Fax:  [phone]  Physical Therapy Treatment  Patient Details  Name: Elizabeth Gray MRN: [id number] Date of Birth: 1941-04-01 Referring Provider (PT): Esmond Plants MD   Encounter Date: 09/17/2021   PT End of Session - 09/17/21 0952     Visit Number 14    Number of Visits 16    Date for PT Re-Evaluation 09/20/21    Authorization Type FOTO AT LEAST EVERY 5TH VISIT.  PROGRESS NOTE AT 10TH VISIT.  KX MODIFIER AFTER 15 VISITS.    PT Start Time [phone]    PT Stop Time 1030    PT Time Calculation (min) 44 min    Activity Tolerance Patient tolerated treatment well    Behavior During Therapy WFL for tasks assessed/performed             Past Medical History:  Diagnosis Date   Allergy    Bursitis    Cancer ([place]) 03/22/2017   Melanoma in situ, lentigo maligna type   Deviated septum    External hemorrhoids without mention of complication 4734   Colonoscopy    Fatigue    GERD (gastroesophageal reflux disease)    Headache(784.0)    migraines   Heat rash    under the breasts.Marland Kitchenappeared on monday.Marland KitchenMarland KitchenBurning & itching, uses cortisone   Hypothyroidism    Iron deficiency anemia, unspecified    Lower esophageal ring 2008   EGD   Nonorganic sleep disorder, unspecified    Osteoarthritis    Osteoporosis    Pancreatitis    Personal history of colonic polyps    Polio 1948   Pulmonary sarcoidosis ([place])    Sleep apnea    [facility]   Vitamin B12 deficiency    Vitamin D deficiency     Past Surgical History:  Procedure Laterality Date   [place]  2002   rt   CHOLECYSTECTOMY  2000   COLONOSCOPY  12/19/2006   Multiple diminutive polyps destroyed-removed (hyperpastic), internal hemorrhoids   ESOPHAGOGASTRODUODENOSCOPY  12/19/2006   lower esophageal ring dilated   HAND SURGERY  1997   left    KNEE ARTHROSCOPY Bilateral    multiple 2 on lft 1 on rt   [place]   Melanoma Removal  Right 2018   right face   TOE SURGERY Left    left foot next to little toe  joint rem   TOTAL KNEE ARTHROPLASTY Right 05/09/2014   Procedure: RIGHT TOTAL KNEE ARTHROPLASTY;  Surgeon: [name], MD;  Location: [facility];  Service: Orthopedics;  Laterality: Right;   TOTAL KNEE ARTHROPLASTY Left 08/06/2021   Procedure: TOTAL KNEE ARTHROPLASTY;  Surgeon: [name], MD;  Location[facility];  Service: Orthopedics;  Laterality: Left;   UPPER GASTROINTESTINAL ENDOSCOPY     WISDOM TOOTH EXTRACTION      There were no vitals filed for this visit.   Subjective Assessment - 09/17/21 0951     Subjective COVID-19 screen performed prior to patient entering clinic. Got a good report from Psychologist, sport and exercise.    Pertinent History OP, OA, right TKA, hypothyroidism.    How long can you walk comfortably? Around home wiht walker.    Patient Stated Goals Perform ADL's without left knee pain.    Currently in Pain? Other (Comment)   No pain assessment provided  [facility] PT Assessment - 09/17/21 0001       Assessment   Medical Diagnosis Left total knee replacement    Referring Provider (PT) Esmond Plants MD    Onset Date/Surgical Date 08/06/21    Next MD Visit TBD      Precautions   Precaution Comments No ultrasound.      Restrictions   Weight Bearing Restrictions No                           [facility] Adult PT Treatment/Exercise - 09/17/21 0001       Knee/Hip Exercises: Aerobic   Recumbent Bike L6-5, seat 2 x 5 min    Nustep L5 x 59min      Knee/Hip Exercises: Machines for Strengthening   Cybex Knee Extension 10# x20 reps LLE    Cybex Knee Flexion 30# x20 reps LLE    Cybex Leg Press 1 pl, seat 6 x20 reps      Knee/Hip Exercises: Standing   Forward Step Up Left;20 reps;Hand Hold: 2;Step Height: 8"    Step Down Left;2 sets;10 reps;Hand Hold: 2;Step Height: 6"       Knee/Hip Exercises: Seated   Sit to Sand 20 reps;without UE support   LLE split stance     Knee/Hip Exercises: Supine   Single Leg Bridge Strengthening;Left;20 reps      Modalities   Modalities Vasopneumatic      Vasopneumatic   Number Minutes Vasopneumatic  15 minutes    Vasopnuematic Location  Knee    Vasopneumatic Pressure Low    Vasopneumatic Temperature  34                          PT Long Term Goals - 09/17/21 1017       PT LONG TERM GOAL #1   Title independent with a HEP.    Time 4    Period Weeks    Status Achieved      PT LONG TERM GOAL #2   Title Full active left knee extension in order to normalize gait.    Time 4    Period Weeks    Status Achieved      PT LONG TERM GOAL #3   Title Active knee flexion to 115 degrees+ so the patient can perform functional tasks and do so with pain not > 2-3/10.    Time 4    Period Weeks    Status Achieved      PT LONG TERM GOAL #4   Title Increase left hip and knee strength to 5/5 to provide good stability for accomplishment of functional activities    Time 4    Period Weeks    Status On-going      PT LONG TERM GOAL #5   Title Perform a reciprocating stair gait with one railing with pain not > 2-3/10.    Time 4    Period Weeks    Status Partially Met                   Plan - 09/17/21 1018     Clinical Impression Statement Patient presented in clinic with good report from surgeon as he was very pleased. Surgeon also concluded that LBP was likely from a strained muscle and an MRI to be completed to L shoulder that she has experienced pain in since TKR. Patient guided through progressive strengthening with no complaints of pain.  Patient did indicate some SOB initially with therex but ran multiple errands prior to PT and was providing subjective through aerobic warm up. Normal vasopneumatic response noted following removal of the modality.    Personal Factors and Comorbidities Comorbidity  1;Comorbidity 2;Other    Comorbidities OP, OA, right TKA, hypothyroidism.    Examination-Activity Limitations Other;Locomotion Level;Stairs;Carry    Examination-Participation Restrictions Other    Stability/Clinical Decision Making Stable/Uncomplicated    Rehab Potential Excellent    PT Frequency 3x / week    PT Duration 4 weeks    PT Treatment/Interventions ADLs/Self Care Home Management;Cryotherapy;Electrical Stimulation;Moist Heat;Gait training;Stair training;Functional mobility training;Therapeutic activities;Therapeutic exercise;Neuromuscular re-education;Manual techniques;Patient/family education;Passive range of motion;Vasopneumatic Device;Visual/perceptual remediation/compensation    PT Next Visit Plan Complete hip strengthening and balance training    PT Home Exercise Plan [email or web address]  URL: [email or web address]  Date: 09/08/2021  Prepared by: Almyra Free     Exercises  Gastroc Stretch on Wall - 2 x daily - 7 x weekly - 1 sets - 3 reps - 30-60 sec hold  Soleus Stretch on Wall - 2 x daily - 7 x weekly - 1 sets - 3 reps - 30-60 sec hold    Consulted and Agree with Plan of Care Patient             Patient will benefit from skilled therapeutic intervention in order to improve the following deficits and impairments:  Abnormal gait, Decreased activity tolerance, Decreased range of motion, Decreased strength, Pain  Visit Diagnosis: Chronic pain of left knee  Stiffness of left knee, not elsewhere classified     Problem List Patient Active Problem List   Diagnosis Date Noted   Insomnia 09/23/2020   Dyslipidemia 09/23/2020   RLS (restless legs syndrome) 07/20/2020   Drug side effects, initial encounter 07/20/2020   Coronary atherosclerosis 03/18/2019   Post-traumatic headache, not intractable 12/04/2018   Grief 09/12/2018   Atrophic vaginitis 09/06/2017   Burning sensation of mouth 09/06/2017   Osteopenia 09/05/2016   Chest pain 03/04/2016   Degenerative arthritis of  right knee 05/09/2014   Headache(784.0) 07/19/2013   Tinnitus 07/19/2013   Dry skin dermatitis 08/31/2012   Left hip pain 08/31/2012   Cerumen impaction 08/31/2012   Well adult exam 05/25/2012   Upper abdominal pain-chronic 04/02/2011   Thumb pain 03/11/2011   Donor, blood 03/11/2011   Shoulder pain 03/11/2011   Bilateral primary osteoarthritis of knee 09/10/2010   VERTIGO 03/05/2010   OSA on CPAP 07/22/2008   B12 deficiency 08/16/2007   Vitamin D deficiency 08/16/2007   Nonorganic sleep disorder 05/10/2007   SARCOIDOSIS, PULMONARY 05/09/2007   Hypothyroidism 05/09/2007   ALLERGIC RHINITIS 05/09/2007   GERD 05/09/2007   Personal history of colon polyps 05/09/2007    Standley Brooking, PTA 09/17/2021, 10:42 AM  [facility] [address] [place], [place], [address] Phone: [phone]   Fax:  [phone]  Name: Elizabeth Gray MRN: [id number] Date of Birth: 1940/11/18

## 2021-09-20 ENCOUNTER — Ambulatory Visit: Payer: Medicare Other | Admitting: Physical Therapy

## 2021-09-20 ENCOUNTER — Encounter: Payer: Self-pay | Admitting: Physical Therapy

## 2021-09-20 ENCOUNTER — Other Ambulatory Visit: Payer: Self-pay

## 2021-09-20 DIAGNOSIS — G8929 Other chronic pain: Secondary | ICD-10-CM | POA: Diagnosis not present

## 2021-09-20 DIAGNOSIS — M542 Cervicalgia: Secondary | ICD-10-CM | POA: Diagnosis not present

## 2021-09-20 DIAGNOSIS — R293 Abnormal posture: Secondary | ICD-10-CM | POA: Diagnosis not present

## 2021-09-20 DIAGNOSIS — M25662 Stiffness of left knee, not elsewhere classified: Secondary | ICD-10-CM | POA: Diagnosis not present

## 2021-09-20 DIAGNOSIS — M25562 Pain in left knee: Secondary | ICD-10-CM | POA: Diagnosis not present

## 2021-09-20 NOTE — Therapy (Signed)
[facility] [place], [place], [address] Phone: [phone]   Fax:  [phone]  Physical Therapy Treatment  Patient Details  Name: Elizabeth Gray MRN: [id number] Date of Birth: [birth year] Referring Provider (PT): Esmond Plants MD   Encounter Date: 09/20/2021   PT End of Session - 09/20/21 1032     Visit Number 15    Number of Visits 16    Date for PT Re-Evaluation 09/20/21    Authorization Type FOTO AT LEAST EVERY 5TH VISIT.  PROGRESS NOTE AT 10TH VISIT.  KX MODIFIER AFTER 15 VISITS.    PT Start Time 1031    PT Stop Time 1114    PT Time Calculation (min) 43 min    Activity Tolerance Patient tolerated treatment well    Behavior During Therapy WFL for tasks assessed/performed             Past Medical History:  Diagnosis Date   Allergy    Bursitis    Cancer ([place]) 03/22/2017   Melanoma in situ, lentigo maligna type   Deviated septum    External hemorrhoids without mention of complication 5732   Colonoscopy    Fatigue    GERD (gastroesophageal reflux disease)    Headache(784.0)    migraines   Heat rash    under the breasts.Marland Kitchenappeared on monday.Marland KitchenMarland KitchenBurning & itching, uses cortisone   Hypothyroidism    Iron deficiency anemia, unspecified    Lower esophageal ring 2008   EGD   Nonorganic sleep disorder, unspecified    Osteoarthritis    Osteoporosis    Pancreatitis    Personal history of colonic polyps    Polio 1948   Pulmonary sarcoidosis ([place])    Sleep apnea    [facility]   Vitamin B12 deficiency    Vitamin D deficiency     Past Surgical History:  Procedure Laterality Date   [place]  2002   rt   CHOLECYSTECTOMY  2000   COLONOSCOPY  12/19/2006   Multiple diminutive polyps destroyed-removed (hyperpastic), internal hemorrhoids   ESOPHAGOGASTRODUODENOSCOPY  12/19/2006   lower esophageal ring dilated   HAND SURGERY  1997   left    KNEE ARTHROSCOPY Bilateral    multiple 2 on lft 1 on rt   [place]   Melanoma Removal  Right 2018   right face   TOE SURGERY Left    left foot next to little toe  joint rem   TOTAL KNEE ARTHROPLASTY Right 05/09/2014   Procedure: RIGHT TOTAL KNEE ARTHROPLASTY;  Surgeon: [name], MD;  Location: [facility];  Service: Orthopedics;  Laterality: Right;   TOTAL KNEE ARTHROPLASTY Left 08/06/2021   Procedure: TOTAL KNEE ARTHROPLASTY;  Surgeon: [name], MD;  Location: [facility];  Service: Orthopedics;  Laterality: Left;   UPPER GASTROINTESTINAL ENDOSCOPY     WISDOM TOOTH EXTRACTION      There were no vitals filed for this visit.   Subjective Assessment - 09/20/21 1031     Subjective COVID-19 screen performed prior to patient entering clinic. No current complaints upon arrival.    Pertinent History OP, OA, right TKA, hypothyroidism.    How long can you walk comfortably? Around home wiht walker.    Patient Stated Goals Perform ADL's without left knee pain.    Currently in Pain? Other (Comment)   No pain assessment provided  [facility] PT Assessment - 09/20/21 0001       Assessment   Medical Diagnosis Left total knee replacement    Referring Provider (PT) Esmond Plants MD    Onset Date/Surgical Date 08/06/21    Next MD Visit TBD      Precautions   Precaution Comments No ultrasound.                           [facility] PT Treatment/Exercise - 09/20/21 0001       Knee/Hip Exercises: Aerobic   Recumbent Bike L4, seat 2 x15 min      Knee/Hip Exercises: Machines for Strengthening   Cybex Knee Extension 10# x30 reps LLE    Cybex Knee Flexion 30# x30 reps LLE    Cybex Leg Press 1 pl, seat 6 x20 reps      Knee/Hip Exercises: Standing   Lateral Step Up Left;Hand Hold: 2;15 reps;Step Height: 6"    Forward Step Up Left;20 reps;Hand Hold: 2;Step Height: 8"    Step Down Left;2 sets;10 reps;Hand Hold: 2;Step Height: 6"    Rocker Board 3  minutes      Modalities   Modalities Vasopneumatic      Vasopneumatic   Number Minutes Vasopneumatic  10 minutes    Vasopnuematic Location  Knee    Vasopneumatic Pressure Low    Vasopneumatic Temperature  34                 Balance Exercises - 09/20/21 0001       Balance Exercises: Standing   Standing Eyes Opened Narrow base of support (BOS);Foam/compliant surface;Time    Standing Eyes Opened Time 2 min    Tandem Stance Eyes open;Foam/compliant surface;Intermittent upper extremity support;Time    Tandem Stance Limitations 2 min    SLS Eyes open;Solid surface;Intermittent upper extremity support;Time   x2 min; R toe touch                    PT Long Term Goals - 09/20/21 1113       PT LONG TERM GOAL #1   Title independent with a HEP.    Time 4    Period Weeks    Status Achieved      PT LONG TERM GOAL #2   Title Full active left knee extension in order to normalize gait.    Time 4    Period Weeks    Status Achieved      PT LONG TERM GOAL #3   Title Active knee flexion to 115 degrees+ so the patient can perform functional tasks and do so with pain not > 2-3/10.    Time 4    Period Weeks    Status Achieved      PT LONG TERM GOAL #4   Title Increase left hip and knee strength to 5/5 to provide good stability for accomplishment of functional activities    Time 4    Period Weeks    Status On-going      PT LONG TERM GOAL #5   Title Perform a reciprocating stair gait with one railing with pain not > 2-3/10.    Time 4    Period Weeks    Status Achieved                   Plan - 09/20/21 1114     Clinical Impression Statement Patient presented in clinic with reports of no current knee  pain. Patient able to tolerate all therex well but more rest breaks required with mechanical machine training. Balance activities also initiated for uneven surfaces with intermittant UE support required. Patient now able to reciprcially ambulate stairs. Normal  vasopneumatic response noted following removal of the modality.    Personal Factors and Comorbidities Comorbidity 1;Comorbidity 2;Other    Comorbidities OP, OA, right TKA, hypothyroidism.    Examination-Activity Limitations Other;Locomotion Level;Stairs;Carry    Examination-Participation Restrictions Other    Stability/Clinical Decision Making Stable/Uncomplicated    Rehab Potential Excellent    PT Frequency 3x / week    PT Duration 4 weeks    PT Treatment/Interventions ADLs/Self Care Home Management;Cryotherapy;Electrical Stimulation;Moist Heat;Gait training;Stair training;Functional mobility training;Therapeutic activities;Therapeutic exercise;Neuromuscular re-education;Manual techniques;Patient/family education;Passive range of motion;Vasopneumatic Device;Visual/perceptual remediation/compensation    PT Next Visit Plan D/C    PT Home Exercise Plan [email or web address]  URL: [email or web address]  Date: 09/08/2021  Prepared by: Almyra Free     Exercises  Gastroc Stretch on Wall - 2 x daily - 7 x weekly - 1 sets - 3 reps - 30-60 sec hold  Soleus Stretch on Wall - 2 x daily - 7 x weekly - 1 sets - 3 reps - 30-60 sec hold    Consulted and Agree with Plan of Care Patient             Patient will benefit from skilled therapeutic intervention in order to improve the following deficits and impairments:  Abnormal gait, Decreased activity tolerance, Decreased range of motion, Decreased strength, Pain  Visit Diagnosis: Chronic pain of left knee  Stiffness of left knee, not elsewhere classified     Problem List Patient Active Problem List   Diagnosis Date Noted   Insomnia 09/23/2020   Dyslipidemia 09/23/2020   RLS (restless legs syndrome) 07/20/2020   Drug side effects, initial encounter 07/20/2020   Coronary atherosclerosis 03/18/2019   Post-traumatic headache, not intractable 12/04/2018   Grief 09/12/2018   Atrophic vaginitis 09/06/2017   Burning sensation of mouth 09/06/2017    Osteopenia 09/05/2016   Chest pain 03/04/2016   Degenerative arthritis of right knee 05/09/2014   Headache(784.0) 07/19/2013   Tinnitus 07/19/2013   Dry skin dermatitis 08/31/2012   Left hip pain 08/31/2012   Cerumen impaction 08/31/2012   Well adult exam 05/25/2012   Upper abdominal pain-chronic 04/02/2011   Thumb pain 03/11/2011   Donor, blood 03/11/2011   Shoulder pain 03/11/2011   Bilateral primary osteoarthritis of knee 09/10/2010   VERTIGO 03/05/2010   OSA on CPAP 07/22/2008   B12 deficiency 08/16/2007   Vitamin D deficiency 08/16/2007   Nonorganic sleep disorder 05/10/2007   SARCOIDOSIS, PULMONARY 05/09/2007   Hypothyroidism 05/09/2007   ALLERGIC RHINITIS 05/09/2007   GERD 05/09/2007   Personal history of colon polyps 05/09/2007    Standley Brooking, PTA 09/20/2021, 12:01 PM  [facility] [address] [place], [place], [address] Phone: [phone]   Fax:  [phone]  Name: Elizabeth Gray MRN: [id number] Date of Birth: 24-Feb-1941

## 2021-09-22 ENCOUNTER — Ambulatory Visit: Payer: Medicare Other | Admitting: Physical Therapy

## 2021-09-22 ENCOUNTER — Other Ambulatory Visit: Payer: Self-pay

## 2021-09-22 ENCOUNTER — Encounter: Payer: Self-pay | Admitting: Physical Therapy

## 2021-09-22 DIAGNOSIS — M25662 Stiffness of left knee, not elsewhere classified: Secondary | ICD-10-CM | POA: Diagnosis not present

## 2021-09-22 DIAGNOSIS — M25562 Pain in left knee: Secondary | ICD-10-CM | POA: Diagnosis not present

## 2021-09-22 DIAGNOSIS — R293 Abnormal posture: Secondary | ICD-10-CM | POA: Diagnosis not present

## 2021-09-22 DIAGNOSIS — G8929 Other chronic pain: Secondary | ICD-10-CM | POA: Diagnosis not present

## 2021-09-22 DIAGNOSIS — M542 Cervicalgia: Secondary | ICD-10-CM | POA: Diagnosis not present

## 2021-09-22 NOTE — Therapy (Addendum)
[facility] [place], [place], [address] Phone: [phone]   Fax:  [phone]  Physical Therapy Treatment  Patient Details  Name: Elizabeth Gray MRN: [id number] Date of Birth: [birth year] Referring Provider (PT): Esmond Plants MD   Encounter Date: 09/22/2021   PT End of Session - 09/22/21 1352     Visit Number 16    Number of Visits 16    Date for PT Re-Evaluation 09/20/21    Authorization Type FOTO AT LEAST EVERY 5TH VISIT.  PROGRESS NOTE AT 10TH VISIT.  KX MODIFIER AFTER 15 VISITS.    PT Start Time 1345    PT Stop Time 1430    PT Time Calculation (min) 45 min    Activity Tolerance Patient tolerated treatment well    Behavior During Therapy WFL for tasks assessed/performed             Past Medical History:  Diagnosis Date   Allergy    Bursitis    Cancer ([place]) 03/22/2017   Melanoma in situ, lentigo maligna type   Deviated septum    External hemorrhoids without mention of complication 6294   Colonoscopy    Fatigue    GERD (gastroesophageal reflux disease)    Headache(784.0)    migraines   Heat rash    under the breasts.Marland Kitchenappeared on monday.Marland KitchenMarland KitchenBurning & itching, uses cortisone   Hypothyroidism    Iron deficiency anemia, unspecified    Lower esophageal ring 2008   EGD   Nonorganic sleep disorder, unspecified    Osteoarthritis    Osteoporosis    Pancreatitis    Personal history of colonic polyps    Polio 1948   Pulmonary sarcoidosis ([place])    Sleep apnea    [facility]   Vitamin B12 deficiency    Vitamin D deficiency     Past Surgical History:  Procedure Laterality Date   [place]  2002   rt   CHOLECYSTECTOMY  2000   COLONOSCOPY  12/19/2006   Multiple diminutive polyps destroyed-removed (hyperpastic), internal hemorrhoids   ESOPHAGOGASTRODUODENOSCOPY  12/19/2006   lower esophageal ring dilated   HAND SURGERY  1997   left    KNEE ARTHROSCOPY Bilateral    multiple 2 on lft 1 on rt   [place]   Melanoma Removal  Right 2018   right face   TOE SURGERY Left    left foot next to little toe  joint rem   TOTAL KNEE ARTHROPLASTY Right 05/09/2014   Procedure: RIGHT TOTAL KNEE ARTHROPLASTY;  Surgeon: [name], MD;  Location: [facility];  Service: Orthopedics;  Laterality: Right;   TOTAL KNEE ARTHROPLASTY Left 08/06/2021   Procedure: TOTAL KNEE ARTHROPLASTY;  Surgeon: [name], MD;  Location: [facility];  Service: Orthopedics;  Laterality: Left;   UPPER GASTROINTESTINAL ENDOSCOPY     WISDOM TOOTH EXTRACTION      There were no vitals filed for this visit.   Subjective Assessment - 09/22/21 1349     Subjective COVID-19 screen performed prior to patient entering clinic. Has been having muscle spasms and has taken meds already. Normally has muscle spasms at night.    Pertinent History OP, OA, right TKA, hypothyroidism.    How long can you walk comfortably? Around home wiht walker.    Patient Stated Goals Perform ADL's without left knee pain.    Currently in Pain? Yes    Pain Score  3     Pain Location Knee    Pain Orientation Left;Medial    Pain Descriptors / Indicators Spasm    Pain Type Surgical pain    Pain Onset More than a month ago    Pain Frequency Intermittent                [facility] PT Assessment - 09/22/21 0001       Assessment   Medical Diagnosis Left total knee replacement    Referring Provider (PT) Esmond Plants MD    Onset Date/Surgical Date 08/06/21    Next MD Visit TBD      Precautions   Precaution Comments No ultrasound.      Observation/Other Assessments   Focus on Therapeutic Outcomes (FOTO)  12% FOTO score      ROM / Strength   AROM / PROM / Strength AROM;Strength      AROM   Overall AROM  Within functional limits for tasks performed    AROM Assessment Site Knee    Right/Left Knee Left    Left Knee Extension 0    Left Knee Flexion 124      Strength    Overall Strength Within functional limits for tasks performed    Strength Assessment Site Hip;Knee    Right/Left Hip Left    Left Hip Flexion 4+/5    Right/Left Knee Left    Left Knee Flexion 5/5    Left Knee Extension 5/5                           [facility] Adult PT Treatment/Exercise - 09/22/21 0001       Knee/Hip Exercises: Aerobic   Recumbent Bike L4, seat 2 x15 min      Knee/Hip Exercises: Machines for Strengthening   Cybex Knee Extension 10# x30 reps LLE    Cybex Knee Flexion 30# x30 reps LLE    Cybex Leg Press 1 pl, seat 6 x30 reps      Modalities   Modalities Vasopneumatic      Vasopneumatic   Number Minutes Vasopneumatic  10 minutes    Vasopnuematic Location  Knee    Vasopneumatic Pressure Low    Vasopneumatic Temperature  34                          PT Long Term Goals - 09/22/21 1426       PT LONG TERM GOAL #1   Title independent with a HEP.    Time 4    Period Weeks    Status Achieved      PT LONG TERM GOAL #2   Title Full active left knee extension in order to normalize gait.    Time 4    Period Weeks    Status Achieved      PT LONG TERM GOAL #3   Title Active knee flexion to 115 degrees+ so the patient can perform functional tasks and do so with pain not > 2-3/10.    Time 4    Period Weeks    Status Achieved      PT LONG TERM GOAL #4   Title Increase left hip and knee strength to 5/5 to provide good stability for accomplishment of functional activities    Time 4    Period Weeks    Status Partially Met      PT LONG TERM GOAL #5   Title Perform a  reciprocating stair gait with one railing with pain not > 2-3/10.    Time 4    Period Weeks    Status Achieved                   Plan - 09/22/21 1434     Clinical Impression Statement Patient has excelled in PT following L TKR in 08/2021. Patient now only currently experiencing muscle spasms in L medial knee which she has medication for. Patient has been able  to achieve or partially achieve all goals set at evaluation. MMT of L hip/knee 4+/5-5/5 and L knee AROM 0-124 dg. Patient denies any functional limitations at home or her job sites and observed ambulating WNL. Normal vasopnuematic response noted following removal of the modality. Final FOTO score of 12%.    Personal Factors and Comorbidities Comorbidity 1;Comorbidity 2;Other    Comorbidities OP, OA, right TKA, hypothyroidism.    Examination-Activity Limitations Other;Locomotion Level;Stairs;Carry    Examination-Participation Restrictions Other    Stability/Clinical Decision Making Stable/Uncomplicated    Rehab Potential Excellent    PT Frequency 3x / week    PT Duration 4 weeks    PT Treatment/Interventions ADLs/Self Care Home Management;Cryotherapy;Electrical Stimulation;Moist Heat;Gait training;Stair training;Functional mobility training;Therapeutic activities;Therapeutic exercise;Neuromuscular re-education;Manual techniques;Patient/family education;Passive range of motion;Vasopneumatic Device;Visual/perceptual remediation/compensation    PT Next Visit Plan D/C    PT Home Exercise Plan [email or web address]  URL: [email or web address]  Date: 09/08/2021  Prepared by: Almyra Free     Exercises  Gastroc Stretch on Wall - 2 x daily - 7 x weekly - 1 sets - 3 reps - 30-60 sec hold  Soleus Stretch on Wall - 2 x daily - 7 x weekly - 1 sets - 3 reps - 30-60 sec hold    Consulted and Agree with Plan of Care Patient             Patient will benefit from skilled therapeutic intervention in order to improve the following deficits and impairments:  Abnormal gait, Decreased activity tolerance, Decreased range of motion, Decreased strength, Pain  Visit Diagnosis: Chronic pain of left knee  Stiffness of left knee, not elsewhere classified     Problem List Patient Active Problem List   Diagnosis Date Noted   Insomnia 09/23/2020   Dyslipidemia 09/23/2020   RLS (restless legs syndrome) 07/20/2020   Drug  side effects, initial encounter 07/20/2020   Coronary atherosclerosis 03/18/2019   Post-traumatic headache, not intractable 12/04/2018   Grief 09/12/2018   Atrophic vaginitis 09/06/2017   Burning sensation of mouth 09/06/2017   Osteopenia 09/05/2016   Chest pain 03/04/2016   Degenerative arthritis of right knee 05/09/2014   Headache(784.0) 07/19/2013   Tinnitus 07/19/2013   Dry skin dermatitis 08/31/2012   Left hip pain 08/31/2012   Cerumen impaction 08/31/2012   Well adult exam 05/25/2012   Upper abdominal pain-chronic 04/02/2011   Thumb pain 03/11/2011   Donor, blood 03/11/2011   Shoulder pain 03/11/2011   Bilateral primary osteoarthritis of knee 09/10/2010   VERTIGO 03/05/2010   OSA on CPAP 07/22/2008   B12 deficiency 08/16/2007   Vitamin D deficiency 08/16/2007   Nonorganic sleep disorder 05/10/2007   SARCOIDOSIS, PULMONARY 05/09/2007   Hypothyroidism 05/09/2007   ALLERGIC RHINITIS 05/09/2007   GERD 05/09/2007   Personal history of colon polyps 05/09/2007    Standley Brooking, PTA 09/22/2021, 2:37 PM  [facility] [address] [place], [place], [address] Phone: [phone]   Fax:  [phone]  Name: Elizabeth Gray MRN: [id number]  Date of Birth: 04-01-1941   PHYSICAL THERAPY DISCHARGE SUMMARY  Visits from Start of Care: 16.  Current functional level related to goals / functional outcomes: See above.   Remaining deficits: Goals essentially met at this time.   Education / Equipment: HEP.   Patient agrees to discharge. Patient goals were partially met. Patient is being discharged due to being pleased with the current functional level.      Mali Applegate MPT      .

## 2021-09-24 ENCOUNTER — Encounter: Payer: Medicare Other | Admitting: Physical Therapy

## 2021-09-24 DIAGNOSIS — M25512 Pain in left shoulder: Secondary | ICD-10-CM | POA: Diagnosis not present

## 2021-09-30 DIAGNOSIS — M75120 Complete rotator cuff tear or rupture of unspecified shoulder, not specified as traumatic: Secondary | ICD-10-CM | POA: Insufficient documentation

## 2021-09-30 DIAGNOSIS — M75122 Complete rotator cuff tear or rupture of left shoulder, not specified as traumatic: Secondary | ICD-10-CM | POA: Diagnosis not present

## 2021-10-06 ENCOUNTER — Other Ambulatory Visit: Payer: Self-pay

## 2021-10-06 MED ORDER — PRAVASTATIN SODIUM 20 MG PO TABS: 20.0000 mg | ORAL_TABLET | Freq: Every day | ORAL | 3 refills | Status: DC

## 2021-10-06 MED ORDER — PRAMIPEXOLE DIHYDROCHLORIDE 1 MG PO TABS: 0.5000 mg | ORAL_TABLET | Freq: Every day | ORAL | 3 refills | Status: DC

## 2021-11-07 ENCOUNTER — Other Ambulatory Visit: Payer: Self-pay | Admitting: Internal Medicine

## 2021-11-09 ENCOUNTER — Encounter: Payer: Self-pay | Admitting: Internal Medicine

## 2021-11-11 ENCOUNTER — Other Ambulatory Visit: Payer: Self-pay | Admitting: Internal Medicine

## 2021-11-11 MED ORDER — PROMETHAZINE-DM 6.25-15 MG/5ML PO SYRP: 5.0000 mL | ORAL_SOLUTION | Freq: Four times a day (QID) | ORAL | 0 refills | Status: DC | PRN

## 2021-11-22 ENCOUNTER — Encounter: Payer: Self-pay | Admitting: Internal Medicine

## 2021-11-22 ENCOUNTER — Ambulatory Visit (INDEPENDENT_AMBULATORY_CARE_PROVIDER_SITE_OTHER): Payer: Medicare Other | Admitting: Internal Medicine

## 2021-11-22 DIAGNOSIS — M170 Bilateral primary osteoarthritis of knee: Secondary | ICD-10-CM

## 2021-11-22 DIAGNOSIS — F5101 Primary insomnia: Secondary | ICD-10-CM

## 2021-11-22 DIAGNOSIS — E538 Deficiency of other specified B group vitamins: Secondary | ICD-10-CM

## 2021-11-22 DIAGNOSIS — E039 Hypothyroidism, unspecified: Secondary | ICD-10-CM

## 2021-11-22 DIAGNOSIS — E559 Vitamin D deficiency, unspecified: Secondary | ICD-10-CM

## 2021-11-22 DIAGNOSIS — M75112 Incomplete rotator cuff tear or rupture of left shoulder, not specified as traumatic: Secondary | ICD-10-CM

## 2021-11-22 DIAGNOSIS — J069 Acute upper respiratory infection, unspecified: Secondary | ICD-10-CM

## 2021-11-22 DIAGNOSIS — M75100 Unspecified rotator cuff tear or rupture of unspecified shoulder, not specified as traumatic: Secondary | ICD-10-CM | POA: Insufficient documentation

## 2021-11-22 LAB — CBC WITH DIFFERENTIAL/PLATELET
Basophils Absolute: 0.1 10*3/uL (ref 0.0–0.1)
Basophils Relative: 0.8 % (ref 0.0–3.0)
Eosinophils Absolute: 0.3 10*3/uL (ref 0.0–0.7)
Eosinophils Relative: 2.7 % (ref 0.0–5.0)
HCT: 39.1 % (ref 36.0–46.0)
Hemoglobin: 13.1 g/dL (ref 12.0–15.0)
Lymphocytes Relative: 21.6 % (ref 12.0–46.0)
Lymphs Abs: 2 10*3/uL (ref 0.7–4.0)
MCHC: 33.5 g/dL (ref 30.0–36.0)
MCV: 83.3 fl (ref 78.0–100.0)
Monocytes Absolute: 0.9 10*3/uL (ref 0.1–1.0)
Monocytes Relative: 10.1 % (ref 3.0–12.0)
Neutro Abs: 6.1 10*3/uL (ref 1.4–7.7)
Neutrophils Relative %: 64.8 % (ref 43.0–77.0)
Platelets: 396 10*3/uL (ref 150.0–400.0)
RBC: 4.69 Mil/uL (ref 3.87–5.11)
RDW: 14.5 % (ref 11.5–15.5)
WBC: 9.4 10*3/uL (ref 4.0–10.5)

## 2021-11-22 LAB — URINALYSIS
Bilirubin Urine: NEGATIVE
Hgb urine dipstick: NEGATIVE
Ketones, ur: NEGATIVE
Leukocytes,Ua: NEGATIVE
Nitrite: NEGATIVE
Specific Gravity, Urine: 1.015 (ref 1.000–1.030)
Urine Glucose: NEGATIVE
Urobilinogen, UA: 0.2 (ref 0.0–1.0)
pH: 7.5 (ref 5.0–8.0)

## 2021-11-22 LAB — COMPREHENSIVE METABOLIC PANEL
ALT: 18 U/L (ref 0–35)
AST: 24 U/L (ref 0–37)
Albumin: 4.4 g/dL (ref 3.5–5.2)
Alkaline Phosphatase: 83 U/L (ref 39–117)
BUN: 23 mg/dL (ref 6–23)
CO2: 33 mEq/L — ABNORMAL HIGH (ref 19–32)
Calcium: 9.7 mg/dL (ref 8.4–10.5)
Chloride: 96 mEq/L (ref 96–112)
Creatinine, Ser: 0.97 mg/dL (ref 0.40–1.20)
GFR: 54.23 mL/min — ABNORMAL LOW (ref >60.00)
Glucose, Bld: 108 mg/dL — ABNORMAL HIGH (ref 70–99)
Potassium: 4.3 mEq/L (ref 3.5–5.1)
Sodium: 138 mEq/L (ref 135–145)
Total Bilirubin: 0.4 mg/dL (ref 0.2–1.2)
Total Protein: 8 g/dL (ref 6.0–8.3)

## 2021-11-22 LAB — LIPID PANEL
Cholesterol: 146 mg/dL (ref 0–200)
HDL: 65.6 mg/dL (ref >39.00)
LDL Cholesterol: 63 mg/dL (ref 0–99)
NonHDL: 80.82
Total CHOL/HDL Ratio: 2
Triglycerides: 91 mg/dL (ref 0.0–149.0)
VLDL: 18.2 mg/dL (ref 0.0–40.0)

## 2021-11-22 LAB — T4, FREE: Free T4: 0.92 ng/dL (ref 0.60–1.60)

## 2021-11-22 LAB — TSH: TSH: 7.39 u[IU]/mL — ABNORMAL HIGH (ref 0.35–5.50)

## 2021-11-22 MED ORDER — LEVOTHYROXINE SODIUM 100 MCG PO TABS: 100.0000 ug | ORAL_TABLET | Freq: Every day | ORAL | 3 refills | Status: DC

## 2021-11-22 MED ORDER — ZOLPIDEM TARTRATE 10 MG PO TABS: 10.0000 mg | ORAL_TABLET | Freq: Every evening | ORAL | 1 refills | Status: DC | PRN

## 2021-11-22 NOTE — Progress Notes (Signed)
? ?Subjective:  ?Patient ID: FEMALE Elizabeth Gray, female    DOB: 1941/01/22  Age: 81 y.o. MRN: [id number] ? ?CC: Follow-up (6 month  f/u) ? ? ?HPI ?[name] presents for URI x 2 weeks - much better ?F/u GERD, hypothyroidism, insomnia ?TKR in Jan 2023 ?C/o L shoulder pain ? ?Outpatient Medications Prior to Visit  ?Medication Sig Dispense Refill  ? Alum Hydroxide-Mag Carbonate (GAVISCON PO) Take 1 tablet by mouth 3 (three) times daily after meals.    ? Cholecalciferol (VITAMIN D PO) Take 1,000 Units by mouth every other day.     ? cyanocobalamin (,VITAMIN B-12,) 1000 MCG/ML injection INJECT 1 ML INTRAMUSCULARLY EVERY 14 DAYS. 2 mL 0  ? Flaxseed, Linseed, (FLAXSEED OIL) 1000 MG CAPS Take 1,000 mg by mouth daily.    ? Glucosamine-Chondroitin (MOVE FREE PO) Take 1 tablet by mouth in the morning and at bedtime.    ? magnesium oxide (MAG-OX) 400 MG tablet Take 400 mg by mouth at bedtime.    ? Multiple Vitamins-Minerals (ICAPS AREDS FORMULA PO) Take 1 capsule by mouth in the morning and at bedtime.    ? omeprazole-sodium bicarbonate (ZEGERID) 40-1100 MG capsule TAKE 1 CAPSULE BY MOUTH ONCE DAILY BEFORE BREAKFAST 90 capsule 4  ? Polyethyl Glycol-Propyl Glycol 0.4-0.3 % SOLN Place 2-3 drops into both eyes 2 (two) times daily as needed (dry eyes).    ? pramipexole (MIRAPEX) 1 MG tablet Take 0.5-1 tablets (0.5-1 mg total) by mouth at bedtime. 90 tablet 3  ? pravastatin (PRAVACHOL) 20 MG tablet Take 1 tablet (20 mg total) by mouth daily. 90 tablet 3  ? SYRINGE-NEEDLE, DISP, 3 ML (B-D 3CC LUER-LOK SYR 25GX1") 25G X 1" 3 ML MISC USE AS DIRECTED 8 each 0  ? traMADol (ULTRAM) 50 MG tablet Take 1-2 tablets (50-100 mg total) by mouth every 6 (six) hours as needed for moderate pain or severe pain. 40 tablet 0  ? levothyroxine (SYNTHROID) 100 MCG tablet Take 1 tablet by mouth once daily 90 tablet 0  ? zolpidem (AMBIEN) 10 MG tablet TAKE 1 TABLET BY MOUTH AT BEDTIME AS NEEDED FOR SLEEP 90 tablet 1  ? fluticasone (FLOVENT HFA) 110  MCG/ACT inhaler Inhale 2 puffs into the lungs 2 (two) times a day. (Patient not taking: Reported on 07/19/2021) 1 Inhaler 12  ? methocarbamol (ROBAXIN) 500 MG tablet Take 1 tablet (500 mg total) by mouth every 8 (eight) hours as needed for muscle spasms. (Patient not taking: Reported on 08/09/2021) 40 tablet 1  ? ondansetron (ZOFRAN) 4 MG tablet Take 1 tablet (4 mg total) by mouth daily as needed for nausea or vomiting. (Patient not taking: Reported on 11/22/2021) 30 tablet 1  ? promethazine-dextromethorphan (PROMETHAZINE-DM) 6.25-15 MG/5ML syrup Take 5 mLs by mouth 4 (four) times daily as needed for cough. (Patient not taking: Reported on 11/22/2021) 240 mL 0  ? ?No facility-administered medications prior to visit.  ? ? ?ROS: ?Review of Systems  ?Constitutional:  Negative for activity change, appetite change, chills, fatigue and unexpected weight change.  ?HENT:  Negative for congestion, mouth sores and sinus pressure.   ?Eyes:  Negative for visual disturbance.  ?Respiratory:  Negative for cough and chest tightness.   ?Gastrointestinal:  Negative for abdominal pain and nausea.  ?Genitourinary:  Negative for difficulty urinating, frequency and vaginal pain.  ?Musculoskeletal:  Positive for arthralgias. Negative for back pain and gait problem.  ?Skin:  Negative for pallor and rash.  ?Neurological:  Negative for dizziness, tremors, weakness, numbness and  headaches.  ?Psychiatric/Behavioral:  Positive for sleep disturbance. Negative for confusion and suicidal ideas. The patient is not nervous/anxious.   ? ?Objective:  ?BP 118/80 (BP Location: Left Arm)   Pulse 89   Temp 98.1 ?F (36.7 ?C) (Oral)   Ht 5' 3.5" (1.613 m)   Wt 143 lb (64.9 kg)   SpO2 95%   BMI 24.93 kg/m?  ? ?BP Readings from Last 3 Encounters:  ?11/22/21 118/80  ?08/06/21 139/77  ?07/21/21 (!) 159/76  ? ? ?Wt Readings from Last 3 Encounters:  ?11/22/21 143 lb (64.9 kg)  ?08/06/21 149 lb 14.6 oz (68 kg)  ?07/21/21 150 lb (68 kg)  ? ? ?Physical  Exam ?Constitutional:   ?   General: She is not in acute distress. ?   Appearance: Normal appearance. She is well-developed.  ?HENT:  ?   Head: Normocephalic.  ?   Right Ear: External ear normal.  ?   Left Ear: External ear normal.  ?   Nose: Nose normal.  ?Eyes:  ?   General:     ?   Right eye: No discharge.     ?   Left eye: No discharge.  ?   Conjunctiva/sclera: Conjunctivae normal.  ?   Pupils: Pupils are equal, round, and reactive to light.  ?Neck:  ?   Thyroid: No thyromegaly.  ?   Vascular: No JVD.  ?   Trachea: No tracheal deviation.  ?Cardiovascular:  ?   Rate and Rhythm: Normal rate and regular rhythm.  ?   Heart sounds: Normal heart sounds.  ?Pulmonary:  ?   Effort: No respiratory distress.  ?   Breath sounds: No stridor. No wheezing.  ?Abdominal:  ?   General: Bowel sounds are normal. There is no distension.  ?   Palpations: Abdomen is soft. There is no mass.  ?   Tenderness: There is no abdominal tenderness. There is no guarding or rebound.  ?Musculoskeletal:     ?   General: Tenderness present.  ?   Cervical back: Normal range of motion and neck supple. No rigidity.  ?   Right lower leg: No edema.  ?   Left lower leg: No edema.  ?Lymphadenopathy:  ?   Cervical: No cervical adenopathy.  ?Skin: ?   Findings: No erythema or rash.  ?Neurological:  ?   Cranial Nerves: No cranial nerve deficit.  ?   Motor: No abnormal muscle tone.  ?   Coordination: Coordination normal.  ?   Deep Tendon Reflexes: Reflexes normal.  ?Psychiatric:     ?   Behavior: Behavior normal.     ?   Thought Content: Thought content normal.     ?   Judgment: Judgment normal.  ?L shoulder painful ? ?Lab Results  ?Component Value Date  ? WBC 8.5 07/21/2021  ? HGB 12.9 07/21/2021  ? HCT 40.8 07/21/2021  ? PLT 339 07/21/2021  ? GLUCOSE 96 07/21/2021  ? CHOL 126 05/24/2021  ? TRIG 92.0 05/24/2021  ? HDL 64.90 05/24/2021  ? [place] 42 05/24/2021  ? ALT 17 05/24/2021  ? AST 25 05/24/2021  ? NA 138 07/21/2021  ? K 4.7 07/21/2021  ? CL 101  07/21/2021  ? CREATININE 0.62 07/21/2021  ? BUN 21 07/21/2021  ? CO2 29 07/21/2021  ? TSH 3.59 05/24/2021  ? INR 3.03 (H) 05/12/2014  ? ? ?No results found. ? ?Assessment & Plan:  ? ?Problem List Items Addressed This Visit   ? ? Hypothyroidism  ?  Cont on Levothroid ?  ?  ? Relevant Medications  ? levothyroxine (SYNTHROID) 100 MCG tablet  ? Other Relevant Orders  ? TSH  ? Urinalysis  ? Lipid panel  ? CBC with Differential/Platelet  ? Comprehensive metabolic panel  ? T4, free  ? B12 deficiency  ?  On B12  ? ?  ?  ? Vitamin D deficiency  ?  On Vit D ? ?  ?  ? Bilateral primary osteoarthritis of knee  ?  S/p L knee - TKR Jan 2023 ?  ?  ? Upper respiratory infection  ?  Recovering ? ?  ?  ? Insomnia  ?  Cont on Zolpidem ?  Potential benefits of a long term benzodiazepines  use as well as potential risks  and complications were explained to the patient and were aknowledged. ? ?  ?  ? Rotator cuff tear  ?  Dr [name] - repair pending ?  ?  ? Relevant Orders  ? TSH  ? Urinalysis  ? Lipid panel  ? CBC with Differential/Platelet  ? Comprehensive metabolic panel  ? T4, free  ?  ? ? ?Meds ordered this encounter  ?Medications  ? zolpidem (AMBIEN) 10 MG tablet  ?  Sig: Take 1 tablet (10 mg total) by mouth at bedtime as needed. for sleep  ?  Dispense:  90 tablet  ?  Refill:  1  ? levothyroxine (SYNTHROID) 100 MCG tablet  ?  Sig: Take 1 tablet (100 mcg total) by mouth daily.  ?  Dispense:  90 tablet  ?  Refill:  3  ?  ? ? ?Follow-up: Return in about 6 months (around 05/24/2022) for Wellness Exam. ? ?Walker Kehr, MD ?

## 2021-11-22 NOTE — Assessment & Plan Note (Signed)
Recovering  

## 2021-11-22 NOTE — Assessment & Plan Note (Signed)
Cont on Zolpidem ?  Potential benefits of a long term benzodiazepines  use as well as potential risks  and complications were explained to the patient and were aknowledged. ?

## 2021-11-22 NOTE — Assessment & Plan Note (Signed)
S/p L knee - TKR Jan 2023 ?

## 2021-11-22 NOTE — Assessment & Plan Note (Signed)
Cont on Levothroid 

## 2021-11-22 NOTE — Assessment & Plan Note (Signed)
On B12 

## 2021-11-22 NOTE — Assessment & Plan Note (Signed)
On Vit D 

## 2021-11-22 NOTE — Assessment & Plan Note (Signed)
Dr [name] - repair pending ?

## 2021-11-23 ENCOUNTER — Emergency Department (HOSPITAL_COMMUNITY): Payer: No Typology Code available for payment source

## 2021-11-23 ENCOUNTER — Other Ambulatory Visit: Payer: Self-pay

## 2021-11-23 ENCOUNTER — Encounter (HOSPITAL_COMMUNITY): Payer: Self-pay

## 2021-11-23 ENCOUNTER — Emergency Department (HOSPITAL_COMMUNITY)
Admission: EM | Admit: 2021-11-23 | Discharge: 2021-11-23 | Disposition: A | Discharge status: Home / Self Care | Payer: No Typology Code available for payment source | Attending: Emergency Medicine | Admitting: Emergency Medicine

## 2021-11-23 DIAGNOSIS — Y92410 Unspecified street and highway as the place of occurrence of the external cause: Secondary | ICD-10-CM | POA: Insufficient documentation

## 2021-11-23 DIAGNOSIS — R55 Syncope and collapse: Secondary | ICD-10-CM | POA: Diagnosis not present

## 2021-11-23 DIAGNOSIS — R42 Dizziness and giddiness: Secondary | ICD-10-CM | POA: Insufficient documentation

## 2021-11-23 DIAGNOSIS — R0602 Shortness of breath: Secondary | ICD-10-CM | POA: Diagnosis not present

## 2021-11-23 DIAGNOSIS — R404 Transient alteration of awareness: Secondary | ICD-10-CM | POA: Diagnosis not present

## 2021-11-23 LAB — COMPREHENSIVE METABOLIC PANEL
ALT: 20 U/L (ref 0–44)
AST: 26 U/L (ref 15–41)
Albumin: 3.8 g/dL (ref 3.5–5.0)
Alkaline Phosphatase: 78 U/L (ref 38–126)
Anion gap: 8 (ref 5–15)
BUN: 35 mg/dL — ABNORMAL HIGH (ref 8–23)
CO2: 30 mmol/L (ref 22–32)
Calcium: 9 mg/dL (ref 8.9–10.3)
Chloride: 100 mmol/L (ref 98–111)
Creatinine, Ser: 0.96 mg/dL (ref 0.44–1.00)
GFR, Estimated: 59 mL/min — ABNORMAL LOW (ref >60)
Glucose, Bld: 133 mg/dL — ABNORMAL HIGH (ref 70–99)
Potassium: 4.5 mmol/L (ref 3.5–5.1)
Sodium: 138 mmol/L (ref 135–145)
Total Bilirubin: 0.2 mg/dL — ABNORMAL LOW (ref 0.3–1.2)
Total Protein: 7.4 g/dL (ref 6.5–8.1)

## 2021-11-23 LAB — CBC WITH DIFFERENTIAL/PLATELET
Abs Immature Granulocytes: 0.05 10*3/uL (ref 0.00–0.07)
Basophils Absolute: 0.1 10*3/uL (ref 0.0–0.1)
Basophils Relative: 1 %
Eosinophils Absolute: 0.1 10*3/uL (ref 0.0–0.5)
Eosinophils Relative: 1 %
HCT: 37.9 % (ref 36.0–46.0)
Hemoglobin: 11.9 g/dL — ABNORMAL LOW (ref 12.0–15.0)
Immature Granulocytes: 1 %
Lymphocytes Relative: 14 %
Lymphs Abs: 1.4 10*3/uL (ref 0.7–4.0)
MCH: 27.5 pg (ref 26.0–34.0)
MCHC: 31.4 g/dL (ref 30.0–36.0)
MCV: 87.5 fL (ref 80.0–100.0)
Monocytes Absolute: 0.7 10*3/uL (ref 0.1–1.0)
Monocytes Relative: 7 %
Neutro Abs: 7.7 10*3/uL (ref 1.7–7.7)
Neutrophils Relative %: 76 %
Platelets: 347 10*3/uL (ref 150–400)
RBC: 4.33 MIL/uL (ref 3.87–5.11)
RDW: 14.4 % (ref 11.5–15.5)
WBC: 10.1 10*3/uL (ref 4.0–10.5)
nRBC: 0 % (ref 0.0–0.2)

## 2021-11-23 LAB — TROPONIN I (HIGH SENSITIVITY)
Troponin I (High Sensitivity): 3 ng/L (ref <18)
Troponin I (High Sensitivity): 3 ng/L (ref <18)

## 2021-11-23 LAB — CBG MONITORING, ED: Glucose-Capillary: 135 mg/dL — ABNORMAL HIGH (ref 70–99)

## 2021-11-23 MED ORDER — SODIUM CHLORIDE 0.9 % IV BOLUS
500.0000 mL | Freq: Once | INTRAVENOUS | Status: AC
  Administered 2021-11-23: 500 mL via INTRAVENOUS
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
## 2021-11-23 NOTE — ED Notes (Signed)
Patient transported to X-ray and CT 
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
## 2021-11-23 NOTE — ED Notes (Signed)
Pt wheeled to lobby. Verbalized understanding discharge instructions and follow-up. In no acute distress.  ? ?

## 2021-11-23 NOTE — ED Notes (Signed)
Pt given ice water per request.

## 2021-11-23 NOTE — ED Notes (Signed)
Pt mentioned she has remembered she felt like she was going to pass out on her way to [facility] yesterday.  ?

## 2021-11-23 NOTE — Discharge Instructions (Signed)
Drink plenty of fluids and stay hydrated.  Follow-up with your family doctor in 1 to 2 weeks or sooner if you continue to have problems ?

## 2021-11-24 ENCOUNTER — Ambulatory Visit (INDEPENDENT_AMBULATORY_CARE_PROVIDER_SITE_OTHER): Payer: Medicare Other | Admitting: Internal Medicine

## 2021-11-24 ENCOUNTER — Encounter: Payer: Self-pay | Admitting: Internal Medicine

## 2021-11-24 VITALS — BP 130/70 | HR 80 | Temp 98.7°F | Ht 63.0 in | Wt 147.0 lb

## 2021-11-24 DIAGNOSIS — K469 Unspecified abdominal hernia without obstruction or gangrene: Secondary | ICD-10-CM | POA: Insufficient documentation

## 2021-11-24 DIAGNOSIS — R55 Syncope and collapse: Secondary | ICD-10-CM | POA: Diagnosis not present

## 2021-11-24 DIAGNOSIS — D649 Anemia, unspecified: Secondary | ICD-10-CM | POA: Insufficient documentation

## 2021-11-24 DIAGNOSIS — J323 Chronic sphenoidal sinusitis: Secondary | ICD-10-CM | POA: Diagnosis not present

## 2021-11-24 DIAGNOSIS — J45909 Unspecified asthma, uncomplicated: Secondary | ICD-10-CM | POA: Insufficient documentation

## 2021-11-24 DIAGNOSIS — E2839 Other primary ovarian failure: Secondary | ICD-10-CM | POA: Insufficient documentation

## 2021-11-24 MED ORDER — CEFDINIR 300 MG PO CAPS: 300.0000 mg | ORAL_CAPSULE | Freq: Two times a day (BID) | ORAL | 0 refills | Status: DC

## 2021-11-24 NOTE — Assessment & Plan Note (Addendum)
Head CT reviewed with the patient.  Start Mona for couple days after surgery ?Use a probiotic w/it ?
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
## 2021-11-29 DIAGNOSIS — Y999 Unspecified external cause status: Secondary | ICD-10-CM | POA: Diagnosis not present

## 2021-11-29 DIAGNOSIS — S46012A Strain of muscle(s) and tendon(s) of the rotator cuff of left shoulder, initial encounter: Secondary | ICD-10-CM | POA: Diagnosis not present

## 2021-11-29 DIAGNOSIS — S43432A Superior glenoid labrum lesion of left shoulder, initial encounter: Secondary | ICD-10-CM | POA: Diagnosis not present

## 2021-11-29 DIAGNOSIS — G8918 Other acute postprocedural pain: Secondary | ICD-10-CM | POA: Diagnosis not present

## 2021-11-29 DIAGNOSIS — X58XXXA Exposure to other specified factors, initial encounter: Secondary | ICD-10-CM | POA: Diagnosis not present

## 2021-12-02 ENCOUNTER — Ambulatory Visit: Payer: Medicare Other | Attending: Physician Assistant | Admitting: Physical Therapy

## 2021-12-02 ENCOUNTER — Encounter: Payer: Self-pay | Admitting: Physical Therapy

## 2021-12-02 DIAGNOSIS — M25612 Stiffness of left shoulder, not elsewhere classified: Secondary | ICD-10-CM | POA: Diagnosis not present

## 2021-12-02 DIAGNOSIS — M25512 Pain in left shoulder: Secondary | ICD-10-CM | POA: Diagnosis not present

## 2021-12-02 DIAGNOSIS — R600 Localized edema: Secondary | ICD-10-CM | POA: Insufficient documentation

## 2021-12-02 NOTE — Therapy (Signed)
[facility] ?[address] ?[place], [place], [address] ?Phone: [phone]   Fax:  [phone] ? ?Physical Therapy Evaluation ? ?Patient Details  ?Name: Elizabeth Gray ?MRN: [id number] ?Date of Birth: 1940/12/14 ?Referring Provider (PT): Esmond Plants MD ? ? ?Encounter Date: 12/02/2021 ? ? PT End of Session - 12/02/21 1229   ? ? Visit Number 1   ? Number of Visits 12   ? Date for PT Re-Evaluation 12/30/21   ? Authorization Type FOTO AT LEAST EVERY 5TH VISIT.  PROGRESS NOTE AT 10TH VISIT.  KX MODIFIER AFTER 15 VISITS.   ? PT Start Time 1115   ? PT Stop Time 8546   ? PT Time Calculation (min) 49 min   ? Activity Tolerance Patient tolerated treatment well   ? Behavior During Therapy [facility] for tasks assessed/performed   ? ?  ?  ? ?  ? ? ?Past Medical History:  ?Diagnosis Date  ? Allergy   ? Bursitis   ? Cancer ([place]) 03/22/2017  ? Melanoma in situ, lentigo maligna type  ? Deviated septum   ? External hemorrhoids without mention of complication 2703  ? Colonoscopy   ? Fatigue   ? GERD (gastroesophageal reflux disease)   ? Headache(784.0)   ? migraines  ? Heat rash   ? under the breasts.Marland Kitchenappeared on monday.Marland KitchenMarland KitchenBurning & itching, uses cortisone  ? Hypothyroidism   ? Iron deficiency anemia, unspecified   ? Lower esophageal ring 2008  ? EGD  ? Nonorganic sleep disorder, unspecified   ? Osteoarthritis   ? Osteoporosis   ? Pancreatitis   ? Personal history of colonic polyps   ? Polio 1948  ? Pulmonary sarcoidosis ([place])   ? Sleep apnea   ? cpap since 09 [facility]  ? Vitamin B12 deficiency   ? Vitamin D deficiency   ? ? ?Past Surgical History:  ?Procedure Laterality Date  ? APPENDECTOMY  1988  ? CARPAL TUNNEL RELEASE  2002  ? rt  ? CHOLECYSTECTOMY  2000  ? COLONOSCOPY  12/19/2006  ? Multiple diminutive polyps destroyed-removed (hyperpastic), internal hemorrhoids  ? ESOPHAGOGASTRODUODENOSCOPY  12/19/2006  ? lower esophageal ring dilated  ? HAND SURGERY  1997  ? left  ?  KNEE ARTHROSCOPY Bilateral   ? multiple 2 on lft 1 on rt  ? [place] SURGERY  1995  ? Melanoma Removal  Right 2018  ? right face  ? TOE SURGERY Left   ? left foot next to little toe  joint rem  ? TOTAL KNEE ARTHROPLASTY Right 05/09/2014  ? Procedure: RIGHT TOTAL KNEE ARTHROPLASTY;  Surgeon: [name], MD;  Location: [facility];  Service: Orthopedics;  Laterality: Right;  ? TOTAL KNEE ARTHROPLASTY Left 08/06/2021  ? Procedure: TOTAL KNEE ARTHROPLASTY;  Surgeon: [name], MD;  Location: [facility];  Service: Orthopedics;  Laterality: Left;  ? UPPER GASTROINTESTINAL ENDOSCOPY    ? WISDOM TOOTH EXTRACTION    ? ? ?There were no vitals filed for this visit. ? ? ? Subjective Assessment - 12/02/21 1230   ? ? Subjective The patient presents to the clinic today s/p left shoulder surgery performed on 11/29/21.  She was using a tie for a sling today.  She had a friend try to help her with her sling with abduction pillow but could not figure out how to use it.  Instructed her in correct usage and it was donned prior to her departure from the clinic today.  At rest her  pain-level is a 2/10.  She states she is compliant to her HEP comprised of pendulums, thigh slides (knee to hip), supine flexion using right UE to assist left and "hug/hitchhikers.".  Ice and medication help decrease pain.  Movement out of sling increases pain.   ? Pertinent History Bilateral TKA's, CTR, lumbar surgery.   ? Patient Stated Goals Use left UE without pain.   ? Currently in Pain? Yes   ? Pain Location Shoulder   ? Pain Orientation Left   ? Pain Descriptors / Indicators Aching   ? Pain Type Surgical pain   ? Pain Onset In the past 7 days   ? Pain Frequency Constant   ? Aggravating Factors  See above.   ? Pain Relieving Factors See above.   ? ?  ?  ? ?  ? ? ? ? ? [facility] PT Assessment - 12/02/21 0001   ? ?  ? Assessment  ? Medical Diagnosis Pain in left shoulder s/p surgery.   ? Referring Provider (PT) Esmond Plants MD   ? Onset Date/Surgical Date --    11/29/21 (surgery date).  ?  ? Precautions  ? Precaution Comments Begin with gentle P/AROM.   ?  ? Restrictions  ? Weight Bearing Restrictions --   No right UE weight bearing.  ?  ? Balance Screen  ? Has the patient fallen in the past 6 months No   ? Has the patient had a decrease in activity level because of a fear of falling?  No   ? Is the patient reluctant to leave their home because of a fear of falling?  No   ?  ? Home Environment  ? Living Environment Private residence   ?  ? Prior Function  ? Level of Independence Independent   ?  ? Observation/Other Assessments  ? Observations Steri-strips and bandaids intact over left shoulder.   ? Focus on Therapeutic Outcomes (FOTO)  Complete.   ?  ? Observation/Other Assessments-Edema   ? Edema --   Min+ left shoulder edema.  ?  ? ROM / Strength  ? AROM / PROM / Strength PROM   ?  ? PROM  ? Overall PROM Comments In supine:  P/AROM into flexion is 105 degrees and Er is 18 degrees.   ?  ? Palpation  ? Palpation comment C/o diffuse left shoulder pain currently.   ? ?  ?  ? ?  ? ? ? ? ? ? ? ? ? ? ? ? ? ?Objective measurements completed on examination: See above findings.  ? ? ? ? ? [facility] Adult PT Treatment/Exercise - 12/02/21 0001   ? ?  ? Modalities  ? Modalities Vasopneumatic   ?  ? Vasopneumatic  ? Number Minutes Vasopneumatic  15 minutes   ? Vasopnuematic Location  --   Left shoulder with pillow between left elbow and thorax.  ? ?  ?  ? ?  ? ? ? ? ? ? ? ? ? ? ? ? ? ? ? PT Long Term Goals - 12/02/21 1251   ? ?  ? PT LONG TERM GOAL #1  ? Title independent with a HEP.   ? Time 4   ? Period Weeks   ? Status New   ?  ? PT LONG TERM GOAL #2  ? Title Active left shoulder flexion to 145 degrees so the patient can easily reach overhead.   ? Time 4   ? Period Weeks   ? Status  New   ?  ? PT LONG TERM GOAL #3  ? Title Active ER to 70 degrees+ to allow for easily donning/doffing of apparel.   ? Time 4   ? Period Weeks   ? Status New   ?  ? PT LONG TERM GOAL #4  ? Title Increase ROM so  patient is able to reach behind back to L3.   ? Time 4   ? Period Weeks   ? Status New   ?  ? PT LONG TERM GOAL #5  ? Title Increase left shoulder strength to a solid 4+/5 to increase stability for performance of functional activities.   ? Time 4   ? Period Weeks   ? Status New   ?  ? PT LONG TERM GOAL #6  ? Title Perform ADL?s with pain not > 2-3/10.   ? Time 6   ? Period Weeks   ? Status New   ? ?  ?  ? ?  ? ? ? ? ? ? ? ? ? Plan - 12/02/21 1242   ? ? Clinical Impression Statement The patient presents to [facility] s/p left shoulder surgery performed on 11/29/21.  She has been compliant to her HEP.  She is pleased with her current progress.  She has an expected loss of range of motion at this time.  She has minimal+ left shoulder edema at this time.  She has been icing frequently.  Her FOTO limitation score is 73% at this time.  Patient will benefit from skilled physical therapy intervention to address pain and deficits.   ? Personal Factors and Comorbidities Comorbidity 1;Comorbidity 2;Other   ? Comorbidities Bilateral TKA's, CTR, lumbar surgery.   ? Examination-Activity Limitations Other;Reach Overhead;Bathing;Dressing   ? Examination-Participation Restrictions Other;Meal Prep   ? Stability/Clinical Decision Making Stable/Uncomplicated   ? Rehab Potential Excellent   ? PT Frequency 3x / week   ? PT Duration 4 weeks   ? PT Treatment/Interventions ADLs/Self Care Home Management;Cryotherapy;Electrical Stimulation;Ultrasound;Therapeutic activities;Therapeutic exercise;Manual techniques;Patient/family education;Passive range of motion;Vasopneumatic Device   ? PT Next Visit Plan Review HEP, begin with left shoulder P/AROM, vasopneumatic on low with pillow between thorax and elbow.   ? Consulted and Agree with Plan of Care Patient   ? ?  ?  ? ?  ? ? ?Patient will benefit from skilled therapeutic intervention in order to improve the following deficits and impairments:  Decreased activity tolerance, Decreased range of motion,  Pain, Increased edema ? ?Visit Diagnosis: ?Acute pain of left shoulder - Plan: PT plan of care cert/re-cert ? ?Stiffness of left shoulder, not elsewhere classified - Plan: PT plan of care cert/re-cert ? ?[place]

## 2021-12-06 ENCOUNTER — Ambulatory Visit: Payer: Medicare Other | Admitting: Physical Therapy

## 2021-12-06 ENCOUNTER — Encounter: Payer: Self-pay | Admitting: Physical Therapy

## 2021-12-06 DIAGNOSIS — M25512 Pain in left shoulder: Secondary | ICD-10-CM | POA: Diagnosis not present

## 2021-12-06 DIAGNOSIS — M25612 Stiffness of left shoulder, not elsewhere classified: Secondary | ICD-10-CM | POA: Diagnosis not present

## 2021-12-06 DIAGNOSIS — R600 Localized edema: Secondary | ICD-10-CM | POA: Diagnosis not present

## 2021-12-06 NOTE — Therapy (Signed)
[place] ?[facility] ?[place] ?[place], [place], [address] ?Phone: [phone]   Fax:  [phone] ? ?Physical Therapy Treatment ? ?Patient Details  ?Name: Elizabeth Gray ?MRN: [id number] ?Date of Birth: [birth year] ?Referring Provider (PT): Esmond Plants MD ? ? ?Encounter Date: 12/06/2021 ? ? PT End of Session - 12/06/21 1440   ? ? Visit Number 2   ? Number of Visits 12   ? Date for PT Re-Evaluation 12/30/21   ? Authorization Type FOTO AT LEAST EVERY 5TH VISIT.  PROGRESS NOTE AT 10TH VISIT.  KX MODIFIER AFTER 15 VISITS.   ? PT Start Time 1420   ? PT Stop Time 3299   ? PT Time Calculation (min) 57 min   ? Activity Tolerance Patient tolerated treatment well   ? Behavior During Therapy [facility] for tasks assessed/performed   ? ?  ?  ? ?  ? ? ?Past Medical History:  ?Diagnosis Date  ? Allergy   ? Bursitis   ? Cancer ([place]) 03/22/2017  ? Melanoma in situ, lentigo maligna type  ? Deviated septum   ? External hemorrhoids without mention of complication 2426  ? Colonoscopy   ? Fatigue   ? GERD (gastroesophageal reflux disease)   ? Headache(784.0)   ? migraines  ? Heat rash   ? under the breasts.Marland Kitchenappeared on monday.Marland KitchenMarland KitchenBurning & itching, uses cortisone  ? Hypothyroidism   ? Iron deficiency anemia, unspecified   ? Lower esophageal ring 2008  ? EGD  ? Nonorganic sleep disorder, unspecified   ? Osteoarthritis   ? Osteoporosis   ? Pancreatitis   ? Personal history of colonic polyps   ? Polio 1948  ? Pulmonary sarcoidosis ([place])   ? Sleep apnea   ? [facility] 09 [facility]  ? Vitamin B12 deficiency   ? Vitamin D deficiency   ? ? ?Past Surgical History:  ?Procedure Laterality Date  ? APPENDECTOMY  1988  ? CARPAL TUNNEL RELEASE  2002  ? rt  ? CHOLECYSTECTOMY  2000  ? COLONOSCOPY  12/19/2006  ? Multiple diminutive polyps destroyed-removed (hyperpastic), internal hemorrhoids  ? ESOPHAGOGASTRODUODENOSCOPY  12/19/2006  ? lower esophageal ring dilated  ? HAND SURGERY  1997  ? left  ?  KNEE ARTHROSCOPY Bilateral   ? multiple 2 on lft 1 on rt  ? [place] SURGERY  1995  ? Melanoma Removal  Right 2018  ? right face  ? TOE SURGERY Left   ? left foot next to little toe  joint rem  ? TOTAL KNEE ARTHROPLASTY Right 05/09/2014  ? Procedure: RIGHT TOTAL KNEE ARTHROPLASTY;  Surgeon: [name], MD;  Location: [place];  Service: Orthopedics;  Laterality: Right;  ? TOTAL KNEE ARTHROPLASTY Left 08/06/2021  ? Procedure: TOTAL KNEE ARTHROPLASTY;  Surgeon: [name], MD;  Location: [facility];  Service: Orthopedics;  Laterality: Left;  ? UPPER GASTROINTESTINAL ENDOSCOPY    ? WISDOM TOOTH EXTRACTION    ? ? ?There were no vitals filed for this visit. ? ? Subjective Assessment - 12/06/21 1440   ? ? Subjective Had sling donned upon arrival.   ? Pertinent History Bilateral TKA's, CTR, lumbar surgery.   ? Patient Stated Goals Use left UE without pain.   ? Currently in Pain? Yes   ? Pain Score 2    ? Pain Location Shoulder   ? Pain Orientation Left;Anterior;Proximal   ? Pain Descriptors / Indicators Discomfort   ? Pain Type Surgical pain   ? Pain  Onset In the past 7 days   ? Pain Frequency Intermittent   ? ?  ?  ? ?  ? ? ? ? ? [facility] PT Assessment - 12/06/21 0001   ? ?  ? Assessment  ? Medical Diagnosis Pain in left shoulder s/p surgery.   ? Referring Provider (PT) Esmond Plants MD   ? Onset Date/Surgical Date 11/29/21   ? Next MD Visit 12/09/2021   ?  ? Precautions  ? Precaution Comments Begin with gentle P/AROM.   ? ?  ?  ? ?  ? ? ? ? ? ? ? ? ? ? ? ? ? ? ? ? [place] Adult PT Treatment/Exercise - 12/06/21 0001   ? ?  ? Modalities  ? Modalities Vasopneumatic   ?  ? Vasopneumatic  ? Number Minutes Vasopneumatic  10 minutes   ? Vasopnuematic Location  Shoulder   ? Vasopneumatic Pressure Low   ? Vasopneumatic Temperature  34   ?  ? Manual Therapy  ? Manual Therapy Passive ROM   ? Passive ROM PROM of L shoulder into flexion, ER with gentle holds at end range   ? ?  ?  ? ?  ? ? ? ? ? ? ? ? ? ? ? ? ? ? ? PT Long Term Goals -  12/06/21 1524   ? ?  ? PT LONG TERM GOAL #1  ? Title independent with a HEP.   ? Time 4   ? Period Weeks   ? Status Achieved   ?  ? PT LONG TERM GOAL #2  ? Title Active left shoulder flexion to 145 degrees so the patient can easily reach overhead.   ? Time 4   ? Period Weeks   ? Status On-going   ?  ? PT LONG TERM GOAL #3  ? Title Active ER to 70 degrees+ to allow for easily donning/doffing of apparel.   ? Time 4   ? Period Weeks   ? Status On-going   ?  ? PT LONG TERM GOAL #4  ? Title Increase ROM so patient is able to reach behind back to L3.   ? Time 4   ? Period Weeks   ? Status On-going   ?  ? PT LONG TERM GOAL #5  ? Title Increase left shoulder strength to a solid 4+/5 to increase stability for performance of functional activities.   ? Time 4   ? Period Weeks   ? Status On-going   ?  ? PT LONG TERM GOAL #6  ? Title Perform ADL?s with pain not > 2-3/10.   ? Time 6   ? Period Weeks   ? Status On-going   ? ?  ?  ? ?  ? ? ? ? ? ? ? ? Plan - 12/06/21 1511   ? ? Clinical Impression Statement Patient presented in clinic with L shoulder sling with adductor pillow donned properly. Patient able to tolerate PROM of L shoulder fairly well but more discomfort with PROM ER. Patient has one incision port that is most uncomfortable in anterior most incision. Firm end feels and smooth arc of motion noted during PROM. Patient reports her compliance with HEP. Normal vasopneumatic response noted following removal of the modality.   ? Personal Factors and Comorbidities Comorbidity 1;Comorbidity 2;Other   ? Comorbidities Bilateral TKA's, CTR, lumbar surgery.   ? Examination-Activity Limitations Other;Reach Overhead;Bathing;Dressing   ? Examination-Participation Restrictions Other;Meal Prep   ? Stability/Clinical Decision Making Stable/Uncomplicated   ?  Rehab Potential Excellent   ? PT Frequency 3x / week   ? PT Duration 4 weeks   ? PT Treatment/Interventions ADLs/Self Care Home Management;Cryotherapy;Electrical  Stimulation;Ultrasound;Therapeutic activities;Therapeutic exercise;Manual techniques;Patient/family education;Passive range of motion;Vasopneumatic Device   ? PT Next Visit Plan Review HEP, begin with left shoulder P/AROM, vasopneumatic on low with pillow between thorax and elbow.   ? Consulted and Agree with Plan of Care Patient   ? ?  ?  ? ?  ? ? ?Patient will benefit from skilled therapeutic intervention in order to improve the following deficits and impairments:  Decreased activity tolerance, Decreased range of motion, Pain, Increased edema ? ?Visit Diagnosis: ?Acute pain of left shoulder ? ?Stiffness of left shoulder, not elsewhere classified ? ?Localized edema ? ? ? ? ?Problem List ?Patient Active Problem List  ? Diagnosis Date Noted  ? Abdominal hernia 11/24/2021  ? Anemia 11/24/2021  ? Asthma 11/24/2021  ? Decreased estrogen level 11/24/2021  ? Syncope 11/24/2021  ? Sphenoid sinusitis 11/24/2021  ? Rotator cuff tear 11/22/2021  ? Full thickness rotator cuff tear 09/30/2021  ? Insomnia 09/23/2020  ? Dyslipidemia 09/23/2020  ? RLS (restless legs syndrome) 07/20/2020  ? Drug side effects, initial encounter 07/20/2020  ? Coronary atherosclerosis 03/18/2019  ? Post-traumatic headache, not intractable 12/04/2018  ? Grief 09/12/2018  ? Atrophic vaginitis 09/06/2017  ? Burning sensation of mouth 09/06/2017  ? Osteopenia 09/05/2016  ? Upper respiratory infection 09/05/2016  ? Chest pain 03/04/2016  ? Headache(784.0) 07/19/2013  ? Tinnitus 07/19/2013  ? Dry skin dermatitis 08/31/2012  ? Left hip pain 08/31/2012  ? Cerumen impaction 08/31/2012  ? Well adult exam 05/25/2012  ? Upper abdominal pain-chronic 04/02/2011  ? Thumb pain 03/11/2011  ? Donor, blood 03/11/2011  ? Shoulder pain 03/11/2011  ? Bilateral primary osteoarthritis of knee 09/10/2010  ? VERTIGO 03/05/2010  ? OSA on CPAP 07/22/2008  ? B12 deficiency 08/16/2007  ? Vitamin D deficiency 08/16/2007  ? Nonorganic sleep disorder 05/10/2007  ? SARCOIDOSIS,  PULMONARY 05/09/2007  ? Hypothyroidism 05/09/2007  ? ALLERGIC RHINITIS 05/09/2007  ? GERD 05/09/2007  ? Personal history of colon polyps 05/09/2007  ? ? ?Standley Brooking, PTA ?12/06/2021, 3:29 PM ? ?[place] [facility]

## 2021-12-08 ENCOUNTER — Ambulatory Visit: Payer: Medicare Other | Admitting: Physical Therapy

## 2021-12-08 ENCOUNTER — Encounter: Payer: Self-pay | Admitting: Physical Therapy

## 2021-12-08 DIAGNOSIS — M25512 Pain in left shoulder: Secondary | ICD-10-CM

## 2021-12-08 DIAGNOSIS — R600 Localized edema: Secondary | ICD-10-CM | POA: Diagnosis not present

## 2021-12-08 DIAGNOSIS — M25612 Stiffness of left shoulder, not elsewhere classified: Secondary | ICD-10-CM | POA: Diagnosis not present

## 2021-12-08 NOTE — Therapy (Signed)
[place] ?[facility] ?[place] ?[place], [place], [address] ?Phone: [phone]   Fax:  [phone] ? ?Physical Therapy Treatment ? ?Patient Details  ?Name: Elizabeth Gray ?MRN: [id number] ?Date of Birth: [birth year] ?Referring Provider (PT): Esmond Plants MD ? ? ?Encounter Date: 12/08/2021 ? ? PT End of Session - 12/08/21 1349   ? ? Visit Number 3   ? Number of Visits 12   ? Date for PT Re-Evaluation 12/30/21   ? Authorization Type FOTO AT LEAST EVERY 5TH VISIT.  PROGRESS NOTE AT 10TH VISIT.  KX MODIFIER AFTER 15 VISITS.   ? PT Start Time 6962   ? PT Stop Time 1428   ? PT Time Calculation (min) 39 min   ? Activity Tolerance Patient tolerated treatment well   ? Behavior During Therapy [facility] for tasks assessed/performed   ? ?  ?  ? ?  ? ? ?Past Medical History:  ?Diagnosis Date  ? Allergy   ? Bursitis   ? Cancer ([place]) 03/22/2017  ? Melanoma in situ, lentigo maligna type  ? Deviated septum   ? External hemorrhoids without mention of complication 9528  ? Colonoscopy   ? Fatigue   ? GERD (gastroesophageal reflux disease)   ? Headache(784.0)   ? migraines  ? Heat rash   ? under the breasts.Marland Kitchenappeared on monday.Marland KitchenMarland KitchenBurning & itching, uses cortisone  ? Hypothyroidism   ? Iron deficiency anemia, unspecified   ? Lower esophageal ring 2008  ? EGD  ? Nonorganic sleep disorder, unspecified   ? Osteoarthritis   ? Osteoporosis   ? Pancreatitis   ? Personal history of colonic polyps   ? Polio 1948  ? Pulmonary sarcoidosis ([place])   ? Sleep apnea   ? cpap [facility]  ? Vitamin B12 deficiency   ? Vitamin D deficiency   ? ? ?Past Surgical History:  ?Procedure Laterality Date  ? APPENDECTOMY  1988  ? CARPAL TUNNEL RELEASE  2002  ? rt  ? CHOLECYSTECTOMY  2000  ? COLONOSCOPY  12/19/2006  ? Multiple diminutive polyps destroyed-removed (hyperpastic), internal hemorrhoids  ? ESOPHAGOGASTRODUODENOSCOPY  12/19/2006  ? lower esophageal ring dilated  ? HAND SURGERY  1997  ? left  ?  KNEE ARTHROSCOPY Bilateral   ? multiple 2 on lft 1 on rt  ? [place] SURGERY  1995  ? Melanoma Removal  Right 2018  ? right face  ? TOE SURGERY Left   ? left foot next to little toe  joint rem  ? TOTAL KNEE ARTHROPLASTY Right 05/09/2014  ? Procedure: RIGHT TOTAL KNEE ARTHROPLASTY;  Surgeon: [name], MD;  Location: [place];  Service: Orthopedics;  Laterality: Right;  ? TOTAL KNEE ARTHROPLASTY Left 08/06/2021  ? Procedure: TOTAL KNEE ARTHROPLASTY;  Surgeon: [name], MD;  [facility];  Service: Orthopedics;  Laterality: Left;  ? UPPER GASTROINTESTINAL ENDOSCOPY    ? WISDOM TOOTH EXTRACTION    ? ? ?There were no vitals filed for this visit. ? ? Subjective Assessment - 12/08/21 1347   ? ? Subjective Reports that she is only taking Tylenol but only has a mild pain in anterior L shoulder.   ? Pertinent History Bilateral TKA's, CTR, lumbar surgery.   ? Patient Stated Goals Use left UE without pain.   ? Currently in Pain? Yes   ? Pain Location Shoulder   ? Pain Orientation Left   ? Pain Descriptors / Indicators Discomfort   ? Pain Type Surgical  pain   ? Pain Onset 1 to 4 weeks ago   ? Pain Frequency Intermittent   ? ?  ?  ? ?  ? ? ? ? ? [facility] PT Assessment - 12/08/21 0001   ? ?  ? Assessment  ? Medical Diagnosis Pain in left shoulder s/p surgery.   ? Referring Provider (PT) Esmond Plants MD   ? Onset Date/Surgical Date 11/29/21   ? Next MD Visit 12/09/2021   ?  ? Precautions  ? Precaution Comments Begin with gentle P/AROM.   ? ?  ?  ? ?  ? ? ? ? ? ? ? ? ? ? ? ? ? ? ? ? [place] Adult PT Treatment/Exercise - 12/08/21 0001   ? ?  ? Modalities  ? Modalities Vasopneumatic   ?  ? Vasopneumatic  ? Number Minutes Vasopneumatic  10 minutes   ? Vasopnuematic Location  Shoulder   ? Vasopneumatic Pressure Low   ? Vasopneumatic Temperature  34   ?  ? Manual Therapy  ? Manual Therapy Passive ROM   ? Passive ROM PROM of L shoulder into flexion, ER with gentle holds at end range   ? ?  ?  ? ?  ? ? ? ? ? ? ? ? ? ? ? ? ? ? ? PT  Long Term Goals - 12/06/21 1524   ? ?  ? PT LONG TERM GOAL #1  ? Title independent with a HEP.   ? Time 4   ? Period Weeks   ? Status Achieved   ?  ? PT LONG TERM GOAL #2  ? Title Active left shoulder flexion to 145 degrees so the patient can easily reach overhead.   ? Time 4   ? Period Weeks   ? Status On-going   ?  ? PT LONG TERM GOAL #3  ? Title Active ER to 70 degrees+ to allow for easily donning/doffing of apparel.   ? Time 4   ? Period Weeks   ? Status On-going   ?  ? PT LONG TERM GOAL #4  ? Title Increase ROM so patient is able to reach behind back to L3.   ? Time 4   ? Period Weeks   ? Status On-going   ?  ? PT LONG TERM GOAL #5  ? Title Increase left shoulder strength to a solid 4+/5 to increase stability for performance of functional activities.   ? Time 4   ? Period Weeks   ? Status On-going   ?  ? PT LONG TERM GOAL #6  ? Title Perform ADL?s with pain not > 2-3/10.   ? Time 6   ? Period Weeks   ? Status On-going   ? ?  ?  ? ?  ? ? ? ? ? ? ? ? Plan - 12/08/21 1418   ? ? Clinical Impression Statement Patient presented in clinic with reports of mild L shoulder discomfort upon arrival. Patient able to tolerate PROM well with only intermitant reports of discomfort with PROM ER. Firm end feels and smooth arc of motion noted during PROM. Normal vasopnuematic response following removal of the modality.   ? Personal Factors and Comorbidities Comorbidity 1;Comorbidity 2;Other   ? Comorbidities Bilateral TKA's, CTR, lumbar surgery.   ? Examination-Activity Limitations Other;Reach Overhead;Bathing;Dressing   ? Examination-Participation Restrictions Other;Meal Prep   ? Stability/Clinical Decision Making Stable/Uncomplicated   ? Rehab Potential Excellent   ? PT Frequency 3x / week   ?  PT Duration 4 weeks   ? PT Treatment/Interventions ADLs/Self Care Home Management;Cryotherapy;Electrical Stimulation;Ultrasound;Therapeutic activities;Therapeutic exercise;Manual techniques;Patient/family education;Passive range of  motion;Vasopneumatic Device   ? PT Next Visit Plan Review HEP, begin with left shoulder P/AROM, vasopneumatic on low with pillow between thorax and elbow.   ? Consulted and Agree with Plan of Care Patient   ? ?  ?  ? ?  ? ? ?Patient will benefit from skilled therapeutic intervention in order to improve the following deficits and impairments:  Decreased activity tolerance, Decreased range of motion, Pain, Increased edema ? ?Visit Diagnosis: ?Acute pain of left shoulder ? ?Stiffness of left shoulder, not elsewhere classified ? ?Localized edema ? ? ? ? ?Problem List ?Patient Active Problem List  ? Diagnosis Date Noted  ? Abdominal hernia 11/24/2021  ? Anemia 11/24/2021  ? Asthma 11/24/2021  ? Decreased estrogen level 11/24/2021  ? Syncope 11/24/2021  ? Sphenoid sinusitis 11/24/2021  ? Rotator cuff tear 11/22/2021  ? Full thickness rotator cuff tear 09/30/2021  ? Insomnia 09/23/2020  ? Dyslipidemia 09/23/2020  ? RLS (restless legs syndrome) 07/20/2020  ? Drug side effects, initial encounter 07/20/2020  ? Coronary atherosclerosis 03/18/2019  ? Post-traumatic headache, not intractable 12/04/2018  ? Grief 09/12/2018  ? Atrophic vaginitis 09/06/2017  ? Burning sensation of mouth 09/06/2017  ? Osteopenia 09/05/2016  ? Upper respiratory infection 09/05/2016  ? Chest pain 03/04/2016  ? Headache(784.0) 07/19/2013  ? Tinnitus 07/19/2013  ? Dry skin dermatitis 08/31/2012  ? Left hip pain 08/31/2012  ? Cerumen impaction 08/31/2012  ? Well adult exam 05/25/2012  ? Upper abdominal pain-chronic 04/02/2011  ? Thumb pain 03/11/2011  ? Donor, blood 03/11/2011  ? Shoulder pain 03/11/2011  ? Bilateral primary osteoarthritis of knee 09/10/2010  ? VERTIGO 03/05/2010  ? OSA on CPAP 07/22/2008  ? B12 deficiency 08/16/2007  ? Vitamin D deficiency 08/16/2007  ? Nonorganic sleep disorder 05/10/2007  ? SARCOIDOSIS, PULMONARY 05/09/2007  ? Hypothyroidism 05/09/2007  ? ALLERGIC RHINITIS 05/09/2007  ? GERD 05/09/2007  ? Personal history of colon  polyps 05/09/2007  ? ? ?Standley Brooking, PTA ?12/08/2021, 2:32 PM ? ?[place] ?[facility] ?[place] ?[place], [place], [address] ?Phone: [phone]   Fax:  [phone] ? ?N

## 2021-12-10 ENCOUNTER — Encounter: Payer: Self-pay | Admitting: Physical Therapy

## 2021-12-10 ENCOUNTER — Ambulatory Visit: Payer: Medicare Other | Admitting: Physical Therapy

## 2021-12-10 DIAGNOSIS — M25612 Stiffness of left shoulder, not elsewhere classified: Secondary | ICD-10-CM | POA: Diagnosis not present

## 2021-12-10 DIAGNOSIS — M25512 Pain in left shoulder: Secondary | ICD-10-CM

## 2021-12-10 DIAGNOSIS — R600 Localized edema: Secondary | ICD-10-CM | POA: Diagnosis not present

## 2021-12-10 NOTE — Therapy (Signed)
[place] ?[facility] ?[place] ?[place], [place], [address] ?Phone: [phone]   Fax:  [phone] ? ?Physical Therapy Treatment ? ?Patient Details  ?Name: Elizabeth Gray ?MRN: [id number] ?Date of Birth: [birth year] ?Referring Provider (PT): Esmond Plants MD ? ? ?Encounter Date: 12/10/2021 ? ? PT End of Session - 12/10/21 1041   ? ? Visit Number 4   ? Number of Visits 12   ? Date for PT Re-Evaluation 12/30/21   ? Authorization Type FOTO AT LEAST EVERY 5TH VISIT.  PROGRESS NOTE AT 10TH VISIT.  KX MODIFIER AFTER 15 VISITS.   ? PT Start Time 1041   ? PT Stop Time 1118   ? PT Time Calculation (min) 37 min   ? Activity Tolerance Patient tolerated treatment well   ? Behavior During Therapy [facility] for tasks assessed/performed   ? ?  ?  ? ?  ? ? ?Past Medical History:  ?Diagnosis Date  ? Allergy   ? Bursitis   ? Cancer ([place]) 03/22/2017  ? Melanoma in situ, lentigo maligna type  ? Deviated septum   ? External hemorrhoids without mention of complication 2536  ? Colonoscopy   ? Fatigue   ? GERD (gastroesophageal reflux disease)   ? Headache(784.0)   ? migraines  ? Heat rash   ? under the breasts.Marland Kitchenappeared on monday.Marland KitchenMarland KitchenBurning & itching, uses cortisone  ? Hypothyroidism   ? Iron deficiency anemia, unspecified   ? Lower esophageal ring 2008  ? EGD  ? Nonorganic sleep disorder, unspecified   ? Osteoarthritis   ? Osteoporosis   ? Pancreatitis   ? Personal history of colonic polyps   ? Polio 1948  ? Pulmonary sarcoidosis ([place])   ? Sleep apnea   ? [facility]  ? Vitamin B12 deficiency   ? Vitamin D deficiency   ? ? ?Past Surgical History:  ?Procedure Laterality Date  ? APPENDECTOMY  1988  ? CARPAL TUNNEL RELEASE  2002  ? rt  ? CHOLECYSTECTOMY  2000  ? COLONOSCOPY  12/19/2006  ? Multiple diminutive polyps destroyed-removed (hyperpastic), internal hemorrhoids  ? ESOPHAGOGASTRODUODENOSCOPY  12/19/2006  ? lower esophageal ring dilated  ? HAND SURGERY  1997  ? left  ?  KNEE ARTHROSCOPY Bilateral   ? multiple 2 on lft 1 on rt  ? [place] SURGERY  1995  ? Melanoma Removal  Right 2018  ? right face  ? TOE SURGERY Left   ? left foot next to little toe  joint rem  ? TOTAL KNEE ARTHROPLASTY Right 05/09/2014  ? Procedure: RIGHT TOTAL KNEE ARTHROPLASTY;  Surgeon: [name], MD;  Location: [facility];  Service: Orthopedics;  Laterality: Right;  ? TOTAL KNEE ARTHROPLASTY Left 08/06/2021  ? Procedure: TOTAL KNEE ARTHROPLASTY;  Surgeon: [name], MD;  Location: [facility];  Service: Orthopedics;  Laterality: Left;  ? UPPER GASTROINTESTINAL ENDOSCOPY    ? WISDOM TOOTH EXTRACTION    ? ? ?There were no vitals filed for this visit. ? ? Subjective Assessment - 12/10/21 1038   ? ? Subjective MD pleased with progress but cannot drive or return to work for two weeks unless indicated by PT.   ? Pertinent History Bilateral TKA's, CTR, lumbar surgery.   ? Patient Stated Goals Use left UE without pain.   ? Currently in Pain? Yes   ? Pain Score 2    ? Pain Location Shoulder   ? Pain Orientation Left;Anterior;Proximal   ? Pain Descriptors /  Indicators Sore   ? Pain Type Surgical pain   ? Pain Onset 1 to 4 weeks ago   ? Pain Frequency Intermittent   ? ?  ?  ? ?  ? ? ? ? ? [facility] PT Assessment - 12/10/21 0001   ? ?  ? Assessment  ? Medical Diagnosis Pain in left shoulder s/p surgery.   ? Referring Provider (PT) Esmond Plants MD   ? Onset Date/Surgical Date 11/29/21   ? Next MD Visit 01/05/2022   ?  ? Precautions  ? Precaution Comments Begin with gentle P/AROM.   ? ?  ?  ? ?  ? ? ? ? ? ? ? ? ? ? ? ? ? ? ? ? [facility] Adult PT Treatment/Exercise - 12/10/21 0001   ? ?  ? Modalities  ? Modalities Vasopneumatic   ?  ? Vasopneumatic  ? Number Minutes Vasopneumatic  10 minutes   ? Vasopnuematic Location  Shoulder   ? Vasopneumatic Pressure Low   ? Vasopneumatic Temperature  34   ?  ? Manual Therapy  ? Manual Therapy Passive ROM   ? Passive ROM PROM of L shoulder into flexion, ER with gentle holds at end range   ? ?  ?   ? ?  ? ? ? ? ? ? ? ? ? ? ? ? ? ? ? PT Long Term Goals - 12/06/21 1524   ? ?  ? PT LONG TERM GOAL #1  ? Title independent with a HEP.   ? Time 4   ? Period Weeks   ? Status Achieved   ?  ? PT LONG TERM GOAL #2  ? Title Active left shoulder flexion to 145 degrees so the patient can easily reach overhead.   ? Time 4   ? Period Weeks   ? Status On-going   ?  ? PT LONG TERM GOAL #3  ? Title Active ER to 70 degrees+ to allow for easily donning/doffing of apparel.   ? Time 4   ? Period Weeks   ? Status On-going   ?  ? PT LONG TERM GOAL #4  ? Title Increase ROM so patient is able to reach behind back to L3.   ? Time 4   ? Period Weeks   ? Status On-going   ?  ? PT LONG TERM GOAL #5  ? Title Increase left shoulder strength to a solid 4+/5 to increase stability for performance of functional activities.   ? Time 4   ? Period Weeks   ? Status On-going   ?  ? PT LONG TERM GOAL #6  ? Title Perform ADL?s with pain not > 2-3/10.   ? Time 6   ? Period Weeks   ? Status On-going   ? ?  ?  ? ?  ? ? ? ? ? ? ? ? Plan - 12/10/21 1121   ? ? Clinical Impression Statement Patient presented in clinic with report from MD to be in sling a while longer and not to drive or return to work until two weeks or until PT approval. Continues to have more discomfort in superior most incision which is approximately 2-3 in and with PROM ER at end range. Firm end feels and smooth arc of motion noted during PROM. Normal vasopnuematic response noted following removal of the modality.   ? Personal Factors and Comorbidities Comorbidity 1;Comorbidity 2;Other   ? Comorbidities Bilateral TKA's, CTR, lumbar surgery.   ? Examination-Activity Limitations  Other;Reach Overhead;Bathing;Dressing   ? Examination-Participation Restrictions Other;Meal Prep   ? Stability/Clinical Decision Making Stable/Uncomplicated   ? Rehab Potential Excellent   ? PT Frequency 3x / week   ? PT Duration 4 weeks   ? PT Treatment/Interventions ADLs/Self Care Home  Management;Cryotherapy;Electrical Stimulation;Ultrasound;Therapeutic activities;Therapeutic exercise;Manual techniques;Patient/family education;Passive range of motion;Vasopneumatic Device   ? PT Next Visit Plan Review HEP, begin with left shoulder P/AROM, vasopneumatic on low with pillow between thorax and elbow.   ? Consulted and Agree with Plan of Care Patient   ? ?  ?  ? ?  ? ? ?Patient will benefit from skilled therapeutic intervention in order to improve the following deficits and impairments:  Decreased activity tolerance, Decreased range of motion, Pain, Increased edema ? ?Visit Diagnosis: ?Acute pain of left shoulder ? ?Stiffness of left shoulder, not elsewhere classified ? ?Localized edema ? ? ? ? ?Problem List ?Patient Active Problem List  ? Diagnosis Date Noted  ? Abdominal hernia 11/24/2021  ? Anemia 11/24/2021  ? Asthma 11/24/2021  ? Decreased estrogen level 11/24/2021  ? Syncope 11/24/2021  ? Sphenoid sinusitis 11/24/2021  ? Rotator cuff tear 11/22/2021  ? Full thickness rotator cuff tear 09/30/2021  ? Insomnia 09/23/2020  ? Dyslipidemia 09/23/2020  ? RLS (restless legs syndrome) 07/20/2020  ? Drug side effects, initial encounter 07/20/2020  ? Coronary atherosclerosis 03/18/2019  ? Post-traumatic headache, not intractable 12/04/2018  ? Grief 09/12/2018  ? Atrophic vaginitis 09/06/2017  ? Burning sensation of mouth 09/06/2017  ? Osteopenia 09/05/2016  ? Upper respiratory infection 09/05/2016  ? Chest pain 03/04/2016  ? Headache(784.0) 07/19/2013  ? Tinnitus 07/19/2013  ? Dry skin dermatitis 08/31/2012  ? Left hip pain 08/31/2012  ? Cerumen impaction 08/31/2012  ? [facility] adult exam 05/25/2012  ? Upper abdominal pain-chronic 04/02/2011  ? Thumb pain 03/11/2011  ? Donor, blood 03/11/2011  ? Shoulder pain 03/11/2011  ? Bilateral primary osteoarthritis of knee 09/10/2010  ? VERTIGO 03/05/2010  ? OSA on CPAP 07/22/2008  ? B12 deficiency 08/16/2007  ? Vitamin D deficiency 08/16/2007  ? Nonorganic sleep disorder  05/10/2007  ? SARCOIDOSIS, PULMONARY 05/09/2007  ? Hypothyroidism 05/09/2007  ? ALLERGIC RHINITIS 05/09/2007  ? GERD 05/09/2007  ? Personal history of colon polyps 05/09/2007  ? ? ?Standley Brooking, PTA ?12/10/2021, 11:32 AM ? ?[facility]

## 2021-12-13 ENCOUNTER — Ambulatory Visit: Payer: Medicare Other | Admitting: Physical Therapy

## 2021-12-13 ENCOUNTER — Encounter: Payer: Self-pay | Admitting: Physical Therapy

## 2021-12-13 DIAGNOSIS — M25512 Pain in left shoulder: Secondary | ICD-10-CM | POA: Diagnosis not present

## 2021-12-13 DIAGNOSIS — R600 Localized edema: Secondary | ICD-10-CM

## 2021-12-13 DIAGNOSIS — M25612 Stiffness of left shoulder, not elsewhere classified: Secondary | ICD-10-CM | POA: Diagnosis not present

## 2021-12-13 NOTE — Therapy (Signed)
[place] ?[facility] ?[place] ?[place], [place], [address] ?Phone: [phone]   Fax:  [phone] ? ?Physical Therapy Treatment ? ?Patient Details  ?Name: Elizabeth Gray ?MRN: [id number] ?Date of Birth: March 26, 1941 ?Referring Provider (PT): Esmond Plants MD ? ? ?Encounter Date: 12/13/2021 ? ? PT End of Session - 12/13/21 1350   ? ? Visit Number 5   ? Number of Visits 12   ? Date for PT Re-Evaluation 12/30/21   ? Authorization Type FOTO AT LEAST EVERY 5TH VISIT.  PROGRESS NOTE AT 10TH VISIT.  KX MODIFIER AFTER 15 VISITS.   ? PT Start Time 1350   ? PT Stop Time 1224   ? PT Time Calculation (min) 39 min   ? Activity Tolerance Patient tolerated treatment well   ? Behavior During Therapy [facility] for tasks assessed/performed   ? ?  ?  ? ?  ? ? ?Past Medical History:  ?Diagnosis Date  ? Allergy   ? Bursitis   ? Cancer ([place]) 03/22/2017  ? Melanoma in situ, lentigo maligna type  ? Deviated septum   ? External hemorrhoids without mention of complication 8250  ? Colonoscopy   ? Fatigue   ? GERD (gastroesophageal reflux disease)   ? Headache(784.0)   ? migraines  ? Heat rash   ? under the breasts.Marland Kitchenappeared on monday.Marland KitchenMarland KitchenBurning & itching, uses cortisone  ? Hypothyroidism   ? Iron deficiency anemia, unspecified   ? Lower esophageal ring 2008  ? EGD  ? Nonorganic sleep disorder, unspecified   ? Osteoarthritis   ? Osteoporosis   ? Pancreatitis   ? Personal history of colonic polyps   ? Polio 1948  ? Pulmonary sarcoidosis ([place])   ? Sleep apnea   ? [facility]  ? Vitamin B12 deficiency   ? Vitamin D deficiency   ? ? ?Past Surgical History:  ?Procedure Laterality Date  ? APPENDECTOMY  1988  ? CARPAL TUNNEL RELEASE  2002  ? rt  ? CHOLECYSTECTOMY  2000  ? COLONOSCOPY  12/19/2006  ? Multiple diminutive polyps destroyed-removed (hyperpastic), internal hemorrhoids  ? ESOPHAGOGASTRODUODENOSCOPY  12/19/2006  ? lower esophageal ring dilated  ? HAND SURGERY  1997  ? left  ?  KNEE ARTHROSCOPY Bilateral   ? multiple 2 on lft 1 on rt  ? [place] SURGERY  1995  ? Melanoma Removal  Right 2018  ? right face  ? TOE SURGERY Left   ? left foot next to little toe  joint rem  ? TOTAL KNEE ARTHROPLASTY Right 05/09/2014  ? Procedure: RIGHT TOTAL KNEE ARTHROPLASTY;  Surgeon: [name], MD;  Location: [facility];  Service: Orthopedics;  Laterality: Right;  ? TOTAL KNEE ARTHROPLASTY Left 08/06/2021  ? Procedure: TOTAL KNEE ARTHROPLASTY;  Surgeon: [name], MD;  [facility];  Service: Orthopedics;  Laterality: Left;  ? UPPER GASTROINTESTINAL ENDOSCOPY    ? WISDOM TOOTH EXTRACTION    ? ? ?There were no vitals filed for this visit. ? ? Subjective Assessment - 12/13/21 1349   ? ? Subjective Reports that she didn't sleep well last night. But states that she may have overdone folding sheets and making the bed. Feels like she popped something in L distal bicep but feels better now.   ? Pertinent History Bilateral TKA's, CTR, lumbar surgery.   ? Patient Stated Goals Use left UE without pain.   ? Currently in Pain? Yes   ? Pain Score 1    ?  Pain Location Shoulder   ? Pain Orientation Left   ? Pain Descriptors / Indicators Discomfort   ? Pain Type Surgical pain   ? Pain Onset 1 to 4 weeks ago   ? Pain Frequency Intermittent   ? ?  ?  ? ?  ? ? ? ? ? [facility] PT Assessment - 12/13/21 0001   ? ?  ? Assessment  ? Medical Diagnosis Pain in left shoulder s/p surgery.   ? Referring Provider (PT) Esmond Plants MD   ? Onset Date/Surgical Date 11/29/21   ? Next MD Visit 01/05/2022   ?  ? Precautions  ? Precaution Comments Begin with gentle P/AROM.   ? ?  ?  ? ?  ? ? ? ? ? ? ? ? ? ? ? ? ? ? ? [facility] Adult PT Treatment/Exercise - 12/13/21 0001   ? ?  ? Modalities  ? Modalities Vasopneumatic   ?  ? Vasopneumatic  ? Number Minutes Vasopneumatic  10 minutes   ? Vasopnuematic Location  Shoulder   ? Vasopneumatic Pressure Low   ? Vasopneumatic Temperature  34   ?  ? Manual Therapy  ? Manual Therapy Passive ROM   ? Passive  ROM PROM of L shoulder into flexion, ER with gentle holds at end range   ? ?  ?  ? ?  ? ? ? ? ? ? ? ? ? ? ? ? ? ? ? PT Long Term Goals - 12/06/21 1524   ? ?  ? PT LONG TERM GOAL #1  ? Title independent with a HEP.   ? Time 4   ? Period Weeks   ? Status Achieved   ?  ? PT LONG TERM GOAL #2  ? Title Active left shoulder flexion to 145 degrees so the patient can easily reach overhead.   ? Time 4   ? Period Weeks   ? Status On-going   ?  ? PT LONG TERM GOAL #3  ? Title Active ER to 70 degrees+ to allow for easily donning/doffing of apparel.   ? Time 4   ? Period Weeks   ? Status On-going   ?  ? PT LONG TERM GOAL #4  ? Title Increase ROM so patient is able to reach behind back to L3.   ? Time 4   ? Period Weeks   ? Status On-going   ?  ? PT LONG TERM GOAL #5  ? Title Increase left shoulder strength to a solid 4+/5 to increase stability for performance of functional activities.   ? Time 4   ? Period Weeks   ? Status On-going   ?  ? PT LONG TERM GOAL #6  ? Title Perform ADL?s with pain not > 2-3/10.   ? Time 6   ? Period Weeks   ? Status On-going   ? ?  ?  ? ?  ? ? ? ? ? ? ? ? Plan - 12/13/21 1421   ? ? Clinical Impression Statement Patient presented in clinic with only very mild L shoulder discomfort. No bruising over L distal bicep area where she felta pop before. Patient still compliant with HEP and feels confident about returning to driving as she was driving with her LUE low on the steering wheel prior to surgery. Firm end feels and smooth arc of motion noted during PROM in all directions. Normal vasopnuematic response noted following removal of the modality.   ? Personal Factors and Comorbidities  Comorbidity 1;Comorbidity 2;Other   ? Comorbidities Bilateral TKA's, CTR, lumbar surgery.   ? Examination-Activity Limitations Other;Reach Overhead;Bathing;Dressing   ? Examination-Participation Restrictions Other;Meal Prep   ? Stability/Clinical Decision Making Stable/Uncomplicated   ? Rehab Potential Excellent   ? PT  Frequency 3x / week   ? PT Duration 4 weeks   ? PT Treatment/Interventions ADLs/Self Care Home Management;Cryotherapy;Electrical Stimulation;Ultrasound;Therapeutic activities;Therapeutic exercise;Manual techniques;Patient/family education;Passive range of motion;Vasopneumatic Device   ? PT Next Visit Plan Review HEP, begin with left shoulder P/AROM, vasopneumatic on low with pillow between thorax and elbow.   ? Consulted and Agree with Plan of Care Patient   ? ?  ?  ? ?  ? ? ?Patient will benefit from skilled therapeutic intervention in order to improve the following deficits and impairments:  Decreased activity tolerance, Decreased range of motion, Pain, Increased edema ? ?Visit Diagnosis: ?Acute pain of left shoulder ? ?Stiffness of left shoulder, not elsewhere classified ? ?Localized edema ? ? ? ? ?Problem List ?Patient Active Problem List  ? Diagnosis Date Noted  ? Abdominal hernia 11/24/2021  ? Anemia 11/24/2021  ? Asthma 11/24/2021  ? Decreased estrogen level 11/24/2021  ? Syncope 11/24/2021  ? Sphenoid sinusitis 11/24/2021  ? Rotator cuff tear 11/22/2021  ? Full thickness rotator cuff tear 09/30/2021  ? Insomnia 09/23/2020  ? Dyslipidemia 09/23/2020  ? RLS (restless legs syndrome) 07/20/2020  ? Drug side effects, initial encounter 07/20/2020  ? Coronary atherosclerosis 03/18/2019  ? Post-traumatic headache, not intractable 12/04/2018  ? Grief 09/12/2018  ? Atrophic vaginitis 09/06/2017  ? Burning sensation of mouth 09/06/2017  ? Osteopenia 09/05/2016  ? Upper respiratory infection 09/05/2016  ? Chest pain 03/04/2016  ? Headache(784.0) 07/19/2013  ? Tinnitus 07/19/2013  ? Dry skin dermatitis 08/31/2012  ? Left hip pain 08/31/2012  ? Cerumen impaction 08/31/2012  ? Well adult exam 05/25/2012  ? Upper abdominal pain-chronic 04/02/2011  ? Thumb pain 03/11/2011  ? Donor, blood 03/11/2011  ? Shoulder pain 03/11/2011  ? Bilateral primary osteoarthritis of knee 09/10/2010  ? VERTIGO 03/05/2010  ? OSA on CPAP  07/22/2008  ? B12 deficiency 08/16/2007  ? Vitamin D deficiency 08/16/2007  ? Nonorganic sleep disorder 05/10/2007  ? SARCOIDOSIS, PULMONARY 05/09/2007  ? Hypothyroidism 05/09/2007  ? ALLERGIC RHINITIS 05/09/2007  ?

## 2021-12-16 ENCOUNTER — Ambulatory Visit: Payer: Medicare Other | Admitting: Physical Therapy

## 2021-12-16 ENCOUNTER — Encounter: Payer: Self-pay | Admitting: Physical Therapy

## 2021-12-16 DIAGNOSIS — M25612 Stiffness of left shoulder, not elsewhere classified: Secondary | ICD-10-CM | POA: Diagnosis not present

## 2021-12-16 DIAGNOSIS — M25512 Pain in left shoulder: Secondary | ICD-10-CM | POA: Diagnosis not present

## 2021-12-16 DIAGNOSIS — R600 Localized edema: Secondary | ICD-10-CM

## 2021-12-16 NOTE — Therapy (Signed)
[facility] [place], [place], [address] Phone: [phone]   Fax:  [phone]  Physical Therapy Treatment  Patient Details  Name: Elizabeth Gray MRN: [id number] Date of Birth: [birth year] Referring Provider (PT): Esmond Plants MD   Encounter Date: 12/16/2021   PT End of Session - 12/16/21 0904     Visit Number 6    Number of Visits 12    Date for PT Re-Evaluation 12/30/21    Authorization Type FOTO AT LEAST EVERY 5TH VISIT.  PROGRESS NOTE AT 10TH VISIT.  KX MODIFIER AFTER 15 VISITS.    PT Start Time 0904    PT Stop Time [phone]    PT Time Calculation (min) 39 min    Activity Tolerance Patient tolerated treatment well    Behavior During Therapy [facility] for tasks assessed/performed             Past Medical History:  Diagnosis Date   Allergy    Bursitis    Cancer ([place]) 03/22/2017   Melanoma in situ, lentigo maligna type   Deviated septum    External hemorrhoids without mention of complication 4010   Colonoscopy    Fatigue    GERD (gastroesophageal reflux disease)    Headache(784.0)    migraines   Heat rash    under the breasts.Marland Kitchenappeared on monday.Marland KitchenMarland KitchenBurning & itching, uses cortisone   Hypothyroidism    Iron deficiency anemia, unspecified    Lower esophageal ring 2008   EGD   Nonorganic sleep disorder, unspecified    Osteoarthritis    Osteoporosis    Pancreatitis    Personal history of colonic polyps    Polio 1948   Pulmonary sarcoidosis ([place])    Sleep apnea    [facility]   Vitamin B12 deficiency    Vitamin D deficiency     Past Surgical History:  Procedure Laterality Date   [place]  2002   rt   CHOLECYSTECTOMY  2000   COLONOSCOPY  12/19/2006   Multiple diminutive polyps destroyed-removed (hyperpastic), internal hemorrhoids   ESOPHAGOGASTRODUODENOSCOPY  12/19/2006   lower esophageal ring dilated   HAND SURGERY  1997   left    KNEE ARTHROSCOPY Bilateral    multiple 2 on lft 1 on rt   [place]   Melanoma Removal  Right 2018   right face   TOE SURGERY Left    left foot next to little toe  joint rem   TOTAL KNEE ARTHROPLASTY Right 05/09/2014   Procedure: RIGHT TOTAL KNEE ARTHROPLASTY;  Surgeon: [name], MD;  Location: [place];  Service: Orthopedics;  Laterality: Right;   TOTAL KNEE ARTHROPLASTY Left 08/06/2021   Procedure: TOTAL KNEE ARTHROPLASTY;  Surgeon: [name], MD;  [facility];  Service: Orthopedics;  Laterality: Left;   UPPER GASTROINTESTINAL ENDOSCOPY     WISDOM TOOTH EXTRACTION      There were no vitals filed for this visit.   Subjective Assessment - 12/16/21 0903     Subjective Reports a little pain.    Pertinent History Bilateral TKA's, CTR, lumbar surgery.    Patient Stated Goals Use left UE without pain.    Currently in Pain? Yes    Pain Score 1     Pain Location Shoulder    Pain Orientation Left    Pain Descriptors / Indicators Sore    Pain Type Surgical pain    Pain Onset  1 to 4 weeks ago    Pain Frequency Intermittent                [facility] PT Assessment - 12/16/21 0001       Assessment   Medical Diagnosis Pain in left shoulder s/p surgery.    Referring Provider (PT) Esmond Plants MD    Onset Date/Surgical Date 11/29/21    Next MD Visit 01/05/2022      Precautions   Precaution Comments Begin with gentle P/AROM.                           [place] Adult PT Treatment/Exercise - 12/16/21 0001       Modalities   Modalities Vasopneumatic      Vasopneumatic   Number Minutes Vasopneumatic  10 minutes    Vasopnuematic Location  Shoulder    Vasopneumatic Pressure Low    Vasopneumatic Temperature  34      Manual Therapy   Manual Therapy Passive ROM    Passive ROM PROM of L shoulder into flexion, ER with gentle holds at end range                          PT Long Term Goals - 12/06/21 1524       PT LONG TERM  GOAL #1   Title independent with a HEP.    Time 4    Period Weeks    Status Achieved      PT LONG TERM GOAL #2   Title Active left shoulder flexion to 145 degrees so the patient can easily reach overhead.    Time 4    Period Weeks    Status On-going      PT LONG TERM GOAL #3   Title Active ER to 70 degrees+ to allow for easily donning/doffing of apparel.    Time 4    Period Weeks    Status On-going      PT LONG TERM GOAL #4   Title Increase ROM so patient is able to reach behind back to L3.    Time 4    Period Weeks    Status On-going      PT LONG TERM GOAL #5   Title Increase left shoulder strength to a solid 4+/5 to increase stability for performance of functional activities.    Time 4    Period Weeks    Status On-going      PT LONG TERM GOAL #6   Title Perform ADL's with pain not > 2-3/10.    Time 6    Period Weeks    Status On-going                   Plan - 12/16/21 5638     Clinical Impression Statement Patient presented in clinic with only minimal L shoulder soreness. Patient did indicate one instance of end range flexion pain with PROM. Intermittant oscillations used to reduce pain and guarding. Firm end feels and smooth arc of motion noted during PROM. Normal vasopnuematic response noted following removal of the modality.    Personal Factors and Comorbidities Comorbidity 1;Comorbidity 2;Other    Comorbidities Bilateral TKA's, CTR, lumbar surgery.    Examination-Activity Limitations Other;Reach Overhead;Bathing;Dressing    Examination-Participation Restrictions Other;Meal Prep    Stability/Clinical Decision Making Stable/Uncomplicated    Rehab Potential Excellent    PT Frequency 3x / week    PT Duration  4 weeks    PT Treatment/Interventions ADLs/Self Care Home Management;Cryotherapy;Electrical Stimulation;Ultrasound;Therapeutic activities;Therapeutic exercise;Manual techniques;Patient/family education;Passive range of motion;Vasopneumatic Device    PT  Next Visit Plan Review HEP, begin with left shoulder P/AROM, vasopneumatic on low with pillow between thorax and elbow.    Consulted and Agree with Plan of Care Patient             Patient will benefit from skilled therapeutic intervention in order to improve the following deficits and impairments:  Decreased activity tolerance, Decreased range of motion, Pain, Increased edema  Visit Diagnosis: Acute pain of left shoulder  Stiffness of left shoulder, not elsewhere classified  Localized edema     Problem List Patient Active Problem List   Diagnosis Date Noted   Abdominal hernia 11/24/2021   Anemia 11/24/2021   Asthma 11/24/2021   Decreased estrogen level 11/24/2021   Syncope 11/24/2021   Sphenoid sinusitis 11/24/2021   Rotator cuff tear 11/22/2021   Full thickness rotator cuff tear 09/30/2021   Insomnia 09/23/2020   Dyslipidemia 09/23/2020   RLS (restless legs syndrome) 07/20/2020   Drug side effects, initial encounter 07/20/2020   Coronary atherosclerosis 03/18/2019   Post-traumatic headache, not intractable 12/04/2018   Grief 09/12/2018   Atrophic vaginitis 09/06/2017   Burning sensation of mouth 09/06/2017   Osteopenia 09/05/2016   Upper respiratory infection 09/05/2016   Chest pain 03/04/2016   Headache(784.0) 07/19/2013   Tinnitus 07/19/2013   Dry skin dermatitis 08/31/2012   Left hip pain 08/31/2012   Cerumen impaction 08/31/2012   Well adult exam 05/25/2012   Upper abdominal pain-chronic 04/02/2011   Thumb pain 03/11/2011   Donor, blood 03/11/2011   Shoulder pain 03/11/2011   Bilateral primary osteoarthritis of knee 09/10/2010   VERTIGO 03/05/2010   OSA on CPAP 07/22/2008   B12 deficiency 08/16/2007   Vitamin D deficiency 08/16/2007   Nonorganic sleep disorder 05/10/2007   SARCOIDOSIS, PULMONARY 05/09/2007   Hypothyroidism 05/09/2007   ALLERGIC RHINITIS 05/09/2007   GERD 05/09/2007   Personal history of colon polyps 05/09/2007    Standley Brooking, PTA 12/16/2021, 10:08 AM  [facility] [address] [place], [place], [address] Phone: [phone]   Fax:  [phone]  Name: Elizabeth Gray MRN: [id number] Date of Birth: 13-Dec-1940

## 2021-12-17 ENCOUNTER — Other Ambulatory Visit: Payer: Self-pay | Admitting: Internal Medicine

## 2021-12-20 ENCOUNTER — Encounter: Payer: Self-pay | Admitting: Physical Therapy

## 2021-12-20 ENCOUNTER — Ambulatory Visit: Payer: Medicare Other | Admitting: Physical Therapy

## 2021-12-20 DIAGNOSIS — R600 Localized edema: Secondary | ICD-10-CM | POA: Diagnosis not present

## 2021-12-20 DIAGNOSIS — M25612 Stiffness of left shoulder, not elsewhere classified: Secondary | ICD-10-CM | POA: Diagnosis not present

## 2021-12-20 DIAGNOSIS — M25512 Pain in left shoulder: Secondary | ICD-10-CM

## 2021-12-20 NOTE — Therapy (Signed)
[facility] [place], [place], [address] Phone: [phone]   Fax:  [phone]  Physical Therapy Treatment  Patient Details  Name: Elizabeth Gray MRN: [id number] Date of Birth: 28-Jan-1941 Referring Provider (PT): Esmond Plants MD   Encounter Date: 12/20/2021   PT End of Session - 12/20/21 0905     Visit Number 7    Number of Visits 12    Date for PT Re-Evaluation 12/30/21    Authorization Type FOTO AT LEAST EVERY 5TH VISIT.  PROGRESS NOTE AT 10TH VISIT.  KX MODIFIER AFTER 15 VISITS.    PT Start Time 0905    PT Stop Time 0947    PT Time Calculation (min) 42 min    Activity Tolerance Patient tolerated treatment well    Behavior During Therapy [facility] for tasks assessed/performed             Past Medical History:  Diagnosis Date   Allergy    Bursitis    Cancer ([place]) 03/22/2017   Melanoma in situ, lentigo maligna type   Deviated septum    External hemorrhoids without mention of complication 1583   Colonoscopy    Fatigue    GERD (gastroesophageal reflux disease)    Headache(784.0)    migraines   Heat rash    under the breasts.Marland Kitchenappeared on monday.Marland KitchenMarland KitchenBurning & itching, uses cortisone   Hypothyroidism    Iron deficiency anemia, unspecified    Lower esophageal ring 2008   EGD   Nonorganic sleep disorder, unspecified    Osteoarthritis    Osteoporosis    Pancreatitis    Personal history of colonic polyps    Polio 1948   Pulmonary sarcoidosis ([place])    Sleep apnea    [facility]   Vitamin B12 deficiency    Vitamin D deficiency     Past Surgical History:  Procedure Laterality Date   [place]  2002   rt   CHOLECYSTECTOMY  2000   COLONOSCOPY  12/19/2006   Multiple diminutive polyps destroyed-removed (hyperpastic), internal hemorrhoids   ESOPHAGOGASTRODUODENOSCOPY  12/19/2006   lower esophageal ring dilated   HAND SURGERY  1997   left    KNEE ARTHROSCOPY Bilateral    multiple 2 on lft 1 on rt   [place]   Melanoma Removal  Right 2018   right face   TOE SURGERY Left    left foot next to little toe  joint rem   TOTAL KNEE ARTHROPLASTY Right 05/09/2014   Procedure: RIGHT TOTAL KNEE ARTHROPLASTY;  Surgeon: [name], MD;  Location: [place];  Service: Orthopedics;  Laterality: Right;   TOTAL KNEE ARTHROPLASTY Left 08/06/2021   Procedure: TOTAL KNEE ARTHROPLASTY;  Surgeon: [name], MD;  [facility];  Service: Orthopedics;  Laterality: Left;   UPPER GASTROINTESTINAL ENDOSCOPY     WISDOM TOOTH EXTRACTION      There were no vitals filed for this visit.   Subjective Assessment - 12/20/21 0905     Subjective Reports overdoing a lot of exercises and activity over  weekend.    Pertinent History Bilateral TKA's, CTR, lumbar surgery.    Patient Stated Goals Use left UE without pain.    Currently in Pain? Yes    Pain Score 2     Pain Location Shoulder    Pain Orientation Left    Pain Descriptors / Indicators Sore    Pain Type  Surgical pain    Pain Onset 1 to 4 weeks ago    Pain Frequency Intermittent                [facility] PT Assessment - 12/20/21 0001       Assessment   Medical Diagnosis Pain in left shoulder s/p surgery.    Referring Provider (PT) Esmond Plants MD    Onset Date/Surgical Date 11/29/21    Next MD Visit 01/05/2022      Precautions   Precaution Comments Begin with gentle P/AROM.                           [place] Adult PT Treatment/Exercise - 12/20/21 0001       Modalities   Modalities Vasopneumatic      Vasopneumatic   Number Minutes Vasopneumatic  15 minutes    Vasopnuematic Location  Shoulder    Vasopneumatic Pressure Low    Vasopneumatic Temperature  34      Manual Therapy   Manual Therapy Passive ROM    Passive ROM PROM of L shoulder into flexion, ER with gentle holds at end range                          PT Long Term  Goals - 12/06/21 1524       PT LONG TERM GOAL #1   Title independent with a HEP.    Time 4    Period Weeks    Status Achieved      PT LONG TERM GOAL #2   Title Active left shoulder flexion to 145 degrees so the patient can easily reach overhead.    Time 4    Period Weeks    Status On-going      PT LONG TERM GOAL #3   Title Active ER to 70 degrees+ to allow for easily donning/doffing of apparel.    Time 4    Period Weeks    Status On-going      PT LONG TERM GOAL #4   Title Increase ROM so patient is able to reach behind back to L3.    Time 4    Period Weeks    Status On-going      PT LONG TERM GOAL #5   Title Increase left shoulder strength to a solid 4+/5 to increase stability for performance of functional activities.    Time 4    Period Weeks    Status On-going      PT LONG TERM GOAL #6   Title Perform ADL's with pain not > 2-3/10.    Time 6    Period Weeks    Status On-going                   Plan - 12/20/21 1001     Clinical Impression Statement Patient had tried to do more activity over the weekend and had some soreness from L shoulder. Patient continued to demonstrate more great PROM of L shoulder in all directions. Firm end feels and smooth arc of motion noted during PROM. Normal vasopnuematic response noted following removal of the modality. 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    Personal Factors and Comorbidities Comorbidity 1;Comorbidity 2;Other    Comorbidities Bilateral TKA's, CTR, lumbar surgery.    Examination-Activity Limitations Other;Reach Overhead;Bathing;Dressing    Examination-Participation Restrictions Other;Meal Prep    Stability/Clinical Decision Making  Stable/Uncomplicated    Rehab Potential Excellent    PT Frequency 3x / week    PT Duration 4 weeks    PT Treatment/Interventions ADLs/Self Care Home Management;Cryotherapy;Electrical Stimulation;Ultrasound;Therapeutic activities;Therapeutic exercise;Manual  techniques;Patient/family education;Passive range of motion;Vasopneumatic Device    PT Next Visit Plan Review HEP, begin with left shoulder P/AROM, vasopneumatic on low with pillow between thorax and elbow.    Consulted and Agree with Plan of Care Patient             Patient will benefit from skilled therapeutic intervention in order to improve the following deficits and impairments:  Decreased activity tolerance, Decreased range of motion, Pain, Increased edema  Visit Diagnosis: Acute pain of left shoulder  Stiffness of left shoulder, not elsewhere classified  Localized edema     Problem List Patient Active Problem List   Diagnosis Date Noted   Abdominal hernia 11/24/2021   Anemia 11/24/2021   Asthma 11/24/2021   Decreased estrogen level 11/24/2021   Syncope 11/24/2021   Sphenoid sinusitis 11/24/2021   Rotator cuff tear 11/22/2021   Full thickness rotator cuff tear 09/30/2021   Insomnia 09/23/2020   Dyslipidemia 09/23/2020   RLS (restless legs syndrome) 07/20/2020   Drug side effects, initial encounter 07/20/2020   Coronary atherosclerosis 03/18/2019   Post-traumatic headache, not intractable 12/04/2018   Grief 09/12/2018   Atrophic vaginitis 09/06/2017   Burning sensation of mouth 09/06/2017   Osteopenia 09/05/2016   Upper respiratory infection 09/05/2016   Chest pain 03/04/2016   Headache(784.0) 07/19/2013   Tinnitus 07/19/2013   Dry skin dermatitis 08/31/2012   Left hip pain 08/31/2012   Cerumen impaction 08/31/2012   Well adult exam 05/25/2012   Upper abdominal pain-chronic 04/02/2011   Thumb pain 03/11/2011   Donor, blood 03/11/2011   Shoulder pain 03/11/2011   Bilateral primary osteoarthritis of knee 09/10/2010   VERTIGO 03/05/2010   OSA on CPAP 07/22/2008   B12 deficiency 08/16/2007   Vitamin D deficiency 08/16/2007   Nonorganic sleep disorder 05/10/2007   SARCOIDOSIS, PULMONARY 05/09/2007   Hypothyroidism 05/09/2007   ALLERGIC RHINITIS  05/09/2007   GERD 05/09/2007   Personal history of colon polyps 05/09/2007    Standley Brooking, PTA 12/20/2021, 10:50 AM  [facility] [address] [place], [place], [address] Phone: [phone]   Fax:  [phone]  Name: PORCHEA CHARRIER MRN: [id number] Date of Birth: 07-02-1941

## 2021-12-23 ENCOUNTER — Other Ambulatory Visit: Payer: Self-pay | Admitting: Physician Assistant

## 2021-12-23 ENCOUNTER — Encounter: Payer: Self-pay | Admitting: Physical Therapy

## 2021-12-23 ENCOUNTER — Ambulatory Visit: Payer: Medicare Other | Admitting: Physical Therapy

## 2021-12-23 DIAGNOSIS — M25612 Stiffness of left shoulder, not elsewhere classified: Secondary | ICD-10-CM

## 2021-12-23 DIAGNOSIS — R600 Localized edema: Secondary | ICD-10-CM

## 2021-12-23 DIAGNOSIS — M25512 Pain in left shoulder: Secondary | ICD-10-CM | POA: Diagnosis not present

## 2021-12-23 NOTE — Therapy (Signed)
[facility] [place], [place], [address] Phone: [phone]   Fax:  [phone]  Physical Therapy Treatment  Patient Details  Name: Elizabeth Gray MRN: [id number] Date of Birth: [birth year] Referring Provider (PT): Esmond Plants MD   Encounter Date: 12/23/2021   PT End of Session - 12/23/21 0904     Visit Number 8    Number of Visits 12    Date for PT Re-Evaluation 12/30/21    Authorization Type FOTO AT LEAST EVERY 5TH VISIT.  PROGRESS NOTE AT 10TH VISIT.  KX MODIFIER AFTER 15 VISITS.    PT Start Time 0904    PT Stop Time 0940    PT Time Calculation (min) 36 min    Activity Tolerance Patient tolerated treatment well    Behavior During Therapy [facility] for tasks assessed/performed             Past Medical History:  Diagnosis Date   Allergy    Bursitis    Cancer ([place]) 03/22/2017   Melanoma in situ, lentigo maligna type   Deviated septum    External hemorrhoids without mention of complication 4081   Colonoscopy    Fatigue    GERD (gastroesophageal reflux disease)    Headache(784.0)    migraines   Heat rash    under the breasts.Marland Kitchenappeared on monday.Marland KitchenMarland KitchenBurning & itching, uses cortisone   Hypothyroidism    Iron deficiency anemia, unspecified    Lower esophageal ring 2008   EGD   Nonorganic sleep disorder, unspecified    Osteoarthritis    Osteoporosis    Pancreatitis    Personal history of colonic polyps    Polio 1948   Pulmonary sarcoidosis ([place])    Sleep apnea    [facility]   Vitamin B12 deficiency    Vitamin D deficiency     Past Surgical History:  Procedure Laterality Date   [place]  2002   rt   CHOLECYSTECTOMY  2000   COLONOSCOPY  12/19/2006   Multiple diminutive polyps destroyed-removed (hyperpastic), internal hemorrhoids   ESOPHAGOGASTRODUODENOSCOPY  12/19/2006   lower esophageal ring dilated   HAND SURGERY  1997   left    KNEE ARTHROSCOPY Bilateral    multiple 2 on lft 1 on rt   [place]   Melanoma Removal  Right 2018   right face   TOE SURGERY Left    left foot next to little toe  joint rem   TOTAL KNEE ARTHROPLASTY Right 05/09/2014   Procedure: RIGHT TOTAL KNEE ARTHROPLASTY;  Surgeon: [name], MD;  Location: [place];  Service: Orthopedics;  Laterality: Right;   TOTAL KNEE ARTHROPLASTY Left 08/06/2021   Procedure: TOTAL KNEE ARTHROPLASTY;  Surgeon: [name], MD;  [facility];  Service: Orthopedics;  Laterality: Left;   UPPER GASTROINTESTINAL ENDOSCOPY     WISDOM TOOTH EXTRACTION      There were no vitals filed for this visit.   Subjective Assessment - 12/23/21 0902     Subjective Reports that she overdid it yesterday but is not sore. 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    Pertinent History Bilateral TKA's, CTR, lumbar surgery.    Patient Stated Goals Use left UE without pain.    Currently in Pain? Yes    Pain Score 1     Pain Location Shoulder    Pain Orientation  Left    Pain Descriptors / Indicators Sore    Pain Type Surgical pain    Pain Onset 1 to 4 weeks ago    Pain Frequency Intermittent                [facility] PT Assessment - 12/23/21 0001       Assessment   Medical Diagnosis Pain in left shoulder s/p surgery.    Referring Provider (PT) Esmond Plants MD    Onset Date/Surgical Date 11/29/21    Next MD Visit 01/05/2022      Precautions   Precaution Comments Begin with gentle P/AROM.      ROM / Strength   AROM / PROM / Strength PROM      PROM   Overall PROM  Within functional limits for tasks performed    PROM Assessment Site Shoulder    Right/Left Shoulder Left    Left Shoulder Flexion 156 Degrees    Left Shoulder External Rotation 80 Degrees                           [facility] Adult PT Treatment/Exercise - 12/23/21 0001       Exercises   Exercises Shoulder      Modalities    Modalities Vasopneumatic      Vasopneumatic   Number Minutes Vasopneumatic  10 minutes    Vasopnuematic Location  Shoulder    Vasopneumatic Pressure Low    Vasopneumatic Temperature  34      Manual Therapy   Manual Therapy Passive ROM    Passive ROM PROM of L shoulder into flexion, ER with gentle holds at end range                          PT Long Term Goals - 12/06/21 1524       PT LONG TERM GOAL #1   Title independent with a HEP.    Time 4    Period Weeks    Status Achieved      PT LONG TERM GOAL #2   Title Active left shoulder flexion to 145 degrees so the patient can easily reach overhead.    Time 4    Period Weeks    Status On-going      PT LONG TERM GOAL #3   Title Active ER to 70 degrees+ to allow for easily donning/doffing of apparel.    Time 4    Period Weeks    Status On-going      PT LONG TERM GOAL #4   Title Increase ROM so patient is able to reach behind back to L3.    Time 4    Period Weeks    Status On-going      PT LONG TERM GOAL #5   Title Increase left shoulder strength to a solid 4+/5 to increase stability for performance of functional activities.    Time 4    Period Weeks    Status On-going      PT LONG TERM GOAL #6   Title Perform ADL's with pain not > 2-3/10.    Time 6    Period Weeks    Status On-going                   Plan - 12/23/21 0933     Clinical Impression Statement Patient presented in clinic with only minimal L shoulder soreness. Patient indicated only minimal mid  deltoid region discomfort with PROM flexion. 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 Firm end feels and smooth arc of motion noted during PROM of L shoulder. Normal vasopnuematic response noted following removal of the modality.    Personal Factors and Comorbidities Comorbidity 1;Comorbidity 2;Other    Comorbidities Bilateral TKA's, CTR, lumbar surgery.    Examination-Activity  Limitations Other;Reach Overhead;Bathing;Dressing    Examination-Participation Restrictions Other;Meal Prep    Stability/Clinical Decision Making Stable/Uncomplicated    Rehab Potential Excellent    PT Frequency 3x / week    PT Duration 4 weeks    PT Treatment/Interventions ADLs/Self Care Home Management;Cryotherapy;Electrical Stimulation;Ultrasound;Therapeutic activities;Therapeutic exercise;Manual techniques;Patient/family education;Passive range of motion;Vasopneumatic Device    PT Next Visit Plan Review HEP, begin with left shoulder P/AROM, vasopneumatic on low with pillow between thorax and elbow.    Consulted and Agree with Plan of Care Patient             Patient will benefit from skilled therapeutic intervention in order to improve the following deficits and impairments:  Decreased activity tolerance, Decreased range of motion, Pain, Increased edema  Visit Diagnosis: Acute pain of left shoulder  Stiffness of left shoulder, not elsewhere classified  Localized edema     Problem List Patient Active Problem List   Diagnosis Date Noted   Abdominal hernia 11/24/2021   Anemia 11/24/2021   Asthma 11/24/2021   Decreased estrogen level 11/24/2021   Syncope 11/24/2021   Sphenoid sinusitis 11/24/2021   Rotator cuff tear 11/22/2021   Full thickness rotator cuff tear 09/30/2021   Insomnia 09/23/2020   Dyslipidemia 09/23/2020   RLS (restless legs syndrome) 07/20/2020   Drug side effects, initial encounter 07/20/2020   Coronary atherosclerosis 03/18/2019   Post-traumatic headache, not intractable 12/04/2018   Grief 09/12/2018   Atrophic vaginitis 09/06/2017   Burning sensation of mouth 09/06/2017   Osteopenia 09/05/2016   Upper respiratory infection 09/05/2016   Chest pain 03/04/2016   Headache(784.0) 07/19/2013   Tinnitus 07/19/2013   Dry skin dermatitis 08/31/2012   Left hip pain 08/31/2012   Cerumen impaction 08/31/2012   Well adult exam 05/25/2012   Upper  abdominal pain-chronic 04/02/2011   Thumb pain 03/11/2011   Donor, blood 03/11/2011   Shoulder pain 03/11/2011   Bilateral primary osteoarthritis of knee 09/10/2010   VERTIGO 03/05/2010   OSA on CPAP 07/22/2008   B12 deficiency 08/16/2007   Vitamin D deficiency 08/16/2007   Nonorganic sleep disorder 05/10/2007   SARCOIDOSIS, PULMONARY 05/09/2007   Hypothyroidism 05/09/2007   ALLERGIC RHINITIS 05/09/2007   GERD 05/09/2007   Personal history of colon polyps 05/09/2007    Standley Brooking, PTA 12/23/2021, 9:47 AM  [facility] [address] [place], [place], [address] Phone: [phone]   Fax:  [phone]  Name: Elizabeth Gray MRN: [id number] Date of Birth: Mar 09, 1941

## 2021-12-29 ENCOUNTER — Ambulatory Visit: Payer: Medicare Other | Admitting: Physical Therapy

## 2021-12-29 ENCOUNTER — Encounter: Payer: Self-pay | Admitting: Physical Therapy

## 2021-12-29 DIAGNOSIS — M25512 Pain in left shoulder: Secondary | ICD-10-CM

## 2021-12-29 DIAGNOSIS — R600 Localized edema: Secondary | ICD-10-CM

## 2021-12-29 DIAGNOSIS — M25612 Stiffness of left shoulder, not elsewhere classified: Secondary | ICD-10-CM | POA: Diagnosis not present

## 2021-12-29 NOTE — Therapy (Signed)
[facility] [place], [place], [address] Phone: [phone]   Fax:  [phone]  Physical Therapy Treatment  Patient Details  Name: Elizabeth Gray MRN: [id number] Date of Birth: 03/27/1941 Referring Provider (PT): Esmond Plants MD   Encounter Date: 12/29/2021   PT End of Session - 12/29/21 1350     Visit Number 9    Number of Visits 12    Date for PT Re-Evaluation 12/30/21    Authorization Type FOTO AT LEAST EVERY 5TH VISIT.  PROGRESS NOTE AT 10TH VISIT.  KX MODIFIER AFTER 15 VISITS.    PT Start Time 1347    PT Stop Time 1432    PT Time Calculation (min) 45 min    Activity Tolerance Patient tolerated treatment well    Behavior During Therapy WFL for tasks assessed/performed             Past Medical History:  Diagnosis Date   Allergy    Bursitis    Cancer ([place]) 03/22/2017   Melanoma in situ, lentigo maligna type   Deviated septum    External hemorrhoids without mention of complication 5945   Colonoscopy    Fatigue    GERD (gastroesophageal reflux disease)    Headache(784.0)    migraines   Heat rash    under the breasts.Marland Kitchenappeared on monday.Marland KitchenMarland KitchenBurning & itching, uses cortisone   Hypothyroidism    Iron deficiency anemia, unspecified    Lower esophageal ring 2008   EGD   Nonorganic sleep disorder, unspecified    Osteoarthritis    Osteoporosis    Pancreatitis    Personal history of colonic polyps    Polio 1948   Pulmonary sarcoidosis ([place])    Sleep apnea    [facility]   Vitamin B12 deficiency    Vitamin D deficiency     Past Surgical History:  Procedure Laterality Date   [place]  2002   rt   CHOLECYSTECTOMY  2000   COLONOSCOPY  12/19/2006   Multiple diminutive polyps destroyed-removed (hyperpastic), internal hemorrhoids   ESOPHAGOGASTRODUODENOSCOPY  12/19/2006   lower esophageal ring dilated   HAND SURGERY  1997   left    KNEE ARTHROSCOPY Bilateral    multiple 2 on lft 1 on rt   [place]   Melanoma Removal  Right 2018   right face   TOE SURGERY Left    left foot next to little toe  joint rem   TOTAL KNEE ARTHROPLASTY Right 05/09/2014   Procedure: RIGHT TOTAL KNEE ARTHROPLASTY;  Surgeon: [name], MD;  Location: [place];  Service: Orthopedics;  Laterality: Right;   TOTAL KNEE ARTHROPLASTY Left 08/06/2021   Procedure: TOTAL KNEE ARTHROPLASTY;  Surgeon: [name], MD;  Location: [facility];  Service: Orthopedics;  Laterality: Left;   UPPER GASTROINTESTINAL ENDOSCOPY     WISDOM TOOTH EXTRACTION      There were no vitals filed for this visit.   Subjective Assessment - 12/29/21 1348     Subjective Reports that she went back to work at [employer] yesterday and had some soreness but not bad.    Pertinent History Bilateral TKA's, CTR, lumbar surgery.    Patient Stated Goals Use left UE without pain.    Currently in Pain? Yes    Pain Score 1     Pain Location Shoulder    Pain Orientation Left    Pain Descriptors /  Indicators Sore    Pain Type Surgical pain    Pain Onset 1 to 4 weeks ago    Pain Frequency Intermittent                [facility] PT Assessment - 12/29/21 0001       Assessment   Medical Diagnosis Pain in left shoulder s/p surgery.    Referring Provider (PT) Esmond Plants MD    Onset Date/Surgical Date 11/29/21    Next MD Visit 01/11/2022      Precautions   Precaution Comments Begin with gentle P/AROM.                           [facility] Adult PT Treatment/Exercise - 12/29/21 0001       Shoulder Exercises: Supine   Protraction AAROM;Both;20 reps;10 reps    External Rotation AAROM;Left;20 reps    Flexion AAROM;Both;20 reps;10 reps      Shoulder Exercises: Standing   Other Standing Exercises Wall ladder x10 reps (level 24)      Shoulder Exercises: Pulleys   Flexion 5 minutes      Shoulder Exercises: ROM/Strengthening   Ranger seated;  flex/ext x3 min, circles x3 min      Modalities   Modalities Vasopneumatic      Vasopneumatic   Number Minutes Vasopneumatic  10 minutes    Vasopnuematic Location  Shoulder    Vasopneumatic Pressure Low    Vasopneumatic Temperature  34      Manual Therapy   Manual Therapy Passive ROM    Passive ROM PROM of L shoulder into flexion, ER with gentle holds at end range                          PT Long Term Goals - 12/06/21 1524       PT LONG TERM GOAL #1   Title independent with a HEP.    Time 4    Period Weeks    Status Achieved      PT LONG TERM GOAL #2   Title Active left shoulder flexion to 145 degrees so the patient can easily reach overhead.    Time 4    Period Weeks    Status On-going      PT LONG TERM GOAL #3   Title Active ER to 70 degrees+ to allow for easily donning/doffing of apparel.    Time 4    Period Weeks    Status On-going      PT LONG TERM GOAL #4   Title Increase ROM so patient is able to reach behind back to L3.    Time 4    Period Weeks    Status On-going      PT LONG TERM GOAL #5   Title Increase left shoulder strength to a solid 4+/5 to increase stability for performance of functional activities.    Time 4    Period Weeks    Status On-going      PT LONG TERM GOAL #6   Title Perform ADL's with pain not > 2-3/10.    Time 6    Period Weeks    Status On-going                   Plan - 12/29/21 1424     Clinical Impression Statement Patient presented in clinic with very minimal pain L shoulder soreness since returning to one of her  part time jobs. Patient able to progress well with AAROM and no complaints of increased pain. Great ROM observed of LUE with AAROM exercises. Firm end feels and smooth arc of motion noted during PROM. Normal vasopneumatic response noted following removal of the modality.    Personal Factors and Comorbidities Comorbidity 1;Comorbidity 2;Other    Comorbidities Bilateral TKA's, CTR, lumbar  surgery.    Examination-Activity Limitations Other;Reach Overhead;Bathing;Dressing    Examination-Participation Restrictions Other;Meal Prep    Stability/Clinical Decision Making Stable/Uncomplicated    Rehab Potential Excellent    PT Frequency 3x / week    PT Duration 4 weeks    PT Treatment/Interventions ADLs/Self Care Home Management;Cryotherapy;Electrical Stimulation;Ultrasound;Therapeutic activities;Therapeutic exercise;Manual techniques;Patient/family education;Passive range of motion;Vasopneumatic Device    PT Next Visit Plan Continue AAROM protocol.    Consulted and Agree with Plan of Care Patient             Patient will benefit from skilled therapeutic intervention in order to improve the following deficits and impairments:  Decreased activity tolerance, Decreased range of motion, Pain, Increased edema  Visit Diagnosis: Acute pain of left shoulder  Stiffness of left shoulder, not elsewhere classified  Localized edema     Problem List Patient Active Problem List   Diagnosis Date Noted   Abdominal hernia 11/24/2021   Anemia 11/24/2021   Asthma 11/24/2021   Decreased estrogen level 11/24/2021   Syncope 11/24/2021   Sphenoid sinusitis 11/24/2021   Rotator cuff tear 11/22/2021   Full thickness rotator cuff tear 09/30/2021   Insomnia 09/23/2020   Dyslipidemia 09/23/2020   RLS (restless legs syndrome) 07/20/2020   Drug side effects, initial encounter 07/20/2020   Coronary atherosclerosis 03/18/2019   Post-traumatic headache, not intractable 12/04/2018   Grief 09/12/2018   Atrophic vaginitis 09/06/2017   Burning sensation of mouth 09/06/2017   Osteopenia 09/05/2016   Upper respiratory infection 09/05/2016   Chest pain 03/04/2016   Headache(784.0) 07/19/2013   Tinnitus 07/19/2013   Dry skin dermatitis 08/31/2012   Left hip pain 08/31/2012   Cerumen impaction 08/31/2012   Well adult exam 05/25/2012   Upper abdominal pain-chronic 04/02/2011   Thumb pain  03/11/2011   Donor, blood 03/11/2011   Shoulder pain 03/11/2011   Bilateral primary osteoarthritis of knee 09/10/2010   VERTIGO 03/05/2010   OSA on CPAP 07/22/2008   B12 deficiency 08/16/2007   Vitamin D deficiency 08/16/2007   Nonorganic sleep disorder 05/10/2007   SARCOIDOSIS, PULMONARY 05/09/2007   Hypothyroidism 05/09/2007   ALLERGIC RHINITIS 05/09/2007   GERD 05/09/2007   Personal history of colon polyps 05/09/2007   Rationale for Evaluation and Treatment Rehabilitation   Standley Brooking, [place] 12/29/2021, 2:45 PM  [facility] [address] [place], [place], [address] Phone: [phone]   Fax:  [phone]  Name: Elizabeth Gray MRN: [id number] Date of Birth: [birth year]

## 2021-12-31 ENCOUNTER — Ambulatory Visit: Payer: Medicare Other | Attending: Physician Assistant | Admitting: Physical Therapy

## 2021-12-31 ENCOUNTER — Encounter: Payer: Self-pay | Admitting: Physical Therapy

## 2021-12-31 DIAGNOSIS — M25612 Stiffness of left shoulder, not elsewhere classified: Secondary | ICD-10-CM | POA: Diagnosis not present

## 2021-12-31 DIAGNOSIS — M25512 Pain in left shoulder: Secondary | ICD-10-CM | POA: Diagnosis not present

## 2021-12-31 DIAGNOSIS — R600 Localized edema: Secondary | ICD-10-CM | POA: Insufficient documentation

## 2021-12-31 DIAGNOSIS — Z1231 Encounter for screening mammogram for malignant neoplasm of breast: Secondary | ICD-10-CM | POA: Diagnosis not present

## 2021-12-31 NOTE — Therapy (Addendum)
[facility] [place], [place], [address] Phone: [phone]   Fax:  [phone]  Physical Therapy Treatment  Patient Details  Name: Elizabeth Gray MRN: [id number] Date of Birth: 1940-12-01 Referring Provider (PT): Esmond Plants MD   Encounter Date: 12/31/2021   PT End of Session - 12/31/21 0849     Visit Number 10    Number of Visits 12    Date for PT Re-Evaluation 12/30/21    Authorization Type FOTO AT LEAST EVERY 5TH VISIT.  PROGRESS NOTE AT 10TH VISIT.  KX MODIFIER AFTER 15 VISITS.    PT Start Time [phone]    PT Stop Time 0928    PT Time Calculation (min) 45 min    Activity Tolerance Patient tolerated treatment well    Behavior During Therapy [facility] for tasks assessed/performed             Past Medical History:  Diagnosis Date   Allergy    Bursitis    Cancer ([place]) 03/22/2017   Melanoma in situ, lentigo maligna type   Deviated septum    External hemorrhoids without mention of complication 0623   Colonoscopy    Fatigue    GERD (gastroesophageal reflux disease)    Headache(784.0)    migraines   Heat rash    under the breasts.Marland Kitchenappeared on monday.Marland KitchenMarland KitchenBurning & itching, uses cortisone   Hypothyroidism    Iron deficiency anemia, unspecified    Lower esophageal ring 2008   EGD   Nonorganic sleep disorder, unspecified    Osteoarthritis    Osteoporosis    Pancreatitis    Personal history of colonic polyps    Polio 1948   Pulmonary sarcoidosis ([place])    Sleep apnea    [facility]   Vitamin B12 deficiency    Vitamin D deficiency     Past Surgical History:  Procedure Laterality Date   [place]  2002   rt   CHOLECYSTECTOMY  2000   COLONOSCOPY  12/19/2006   Multiple diminutive polyps destroyed-removed (hyperpastic), internal hemorrhoids   ESOPHAGOGASTRODUODENOSCOPY  12/19/2006   lower esophageal ring dilated   HAND SURGERY  1997   left    KNEE ARTHROSCOPY Bilateral    multiple 2 on lft 1 on rt   [place]   Melanoma Removal  Right 2018   right face   TOE SURGERY Left    left foot next to little toe  joint rem   TOTAL KNEE ARTHROPLASTY Right 05/09/2014   Procedure: RIGHT TOTAL KNEE ARTHROPLASTY;  Surgeon: [name], MD;  Location: [facility];  Service: Orthopedics;  Laterality: Right;   TOTAL KNEE ARTHROPLASTY Left 08/06/2021   Procedure: TOTAL KNEE ARTHROPLASTY;  Surgeon: [name], MD;  [facility];  Service: Orthopedics;  Laterality: Left;   UPPER GASTROINTESTINAL ENDOSCOPY     WISDOM TOOTH EXTRACTION      There were no vitals filed for this visit.   Subjective Assessment - 12/31/21 0845     Subjective Was a little sore after last session.    Pertinent History Bilateral TKA's, CTR, lumbar surgery.    Patient Stated Goals Use left UE without pain.    Currently in Pain? Yes    Pain Score --   No pain score provided   Pain Location Shoulder    Pain Orientation Left    Pain Descriptors / Indicators Sore    Pain Type  Surgical pain    Pain Onset 1 to 4 weeks ago    Pain Frequency Intermittent                [facility] PT Assessment - 12/31/21 0001       Assessment   Medical Diagnosis Pain in left shoulder s/p surgery.    Referring Provider (PT) Esmond Plants MD    Onset Date/Surgical Date 11/29/21    Next MD Visit 01/11/2022      Precautions   Precaution Comments Begin with gentle P/AROM.      Observation/Other Assessments   Focus on Therapeutic Outcomes (FOTO)  32% limitation 10th visit      ROM / Strength   AROM / PROM / Strength PROM      PROM   Overall PROM  Within functional limits for tasks performed    PROM Assessment Site Shoulder    Right/Left Shoulder Left    Left Shoulder Flexion 165 Degrees    Left Shoulder Internal Rotation 71 Degrees    Left Shoulder External Rotation 80 Degrees                           [facility] Adult PT Treatment/Exercise -  12/31/21 0001       Shoulder Exercises: Supine   Protraction AAROM;Both;20 reps;10 reps    Flexion AAROM;Both;20 reps;10 reps      Shoulder Exercises: Standing   Other Standing Exercises Wall ladder x10 reps (level 24)      Shoulder Exercises: Pulleys   Flexion 5 minutes      Shoulder Exercises: ROM/Strengthening   Ranger standing; flex x20 reps      Modalities   Modalities Vasopneumatic      Vasopneumatic   Number Minutes Vasopneumatic  10 minutes    Vasopnuematic Location  Shoulder    Vasopneumatic Pressure Low    Vasopneumatic Temperature  34      Manual Therapy   Manual Therapy Passive ROM    Passive ROM PROM of L shoulder into flexion, ER with gentle holds at end range                          PT Long Term Goals - 12/06/21 1524       PT LONG TERM GOAL #1   Title independent with a HEP.    Time 4    Period Weeks    Status Achieved      PT LONG TERM GOAL #2   Title Active left shoulder flexion to 145 degrees so the patient can easily reach overhead.    Time 4    Period Weeks    Status On-going      PT LONG TERM GOAL #3   Title Active ER to 70 degrees+ to allow for easily donning/doffing of apparel.    Time 4    Period Weeks    Status On-going      PT LONG TERM GOAL #4   Title Increase ROM so patient is able to reach behind back to L3.    Time 4    Period Weeks    Status On-going      PT LONG TERM GOAL #5   Title Increase left shoulder strength to a solid 4+/5 to increase stability for performance of functional activities.    Time 4    Period Weeks    Status On-going      PT LONG  TERM GOAL #6   Title Perform ADL's with pain not > 2-3/10.    Time 6    Period Weeks    Status On-going                   Plan - 12/31/21 0932     Clinical Impression Statement Patient presented in clinic with reports of minimal soreness after progressing in protcol at last visit. Patient progressed to more standing AAROM exercises today as  patient is progressing well and functional ROM is returning greatly. Patient still wearing sling wiht adductor pillow out of caution. Patient's PROM measured and listed in today's note with no reports of pain at end range. Firm end feels and smooth arc of motion noted during PROM. Normal vasopneumatic response noted following removal of the modality.    Personal Factors and Comorbidities Comorbidity 1;Comorbidity 2;Other    Comorbidities Bilateral TKA's, CTR, lumbar surgery.    Examination-Activity Limitations Other;Reach Overhead;Bathing;Dressing    Examination-Participation Restrictions Other;Meal Prep    Stability/Clinical Decision Making Stable/Uncomplicated    Rehab Potential Excellent    PT Frequency 3x / week    PT Duration 4 weeks    PT Treatment/Interventions ADLs/Self Care Home Management;Cryotherapy;Electrical Stimulation;Ultrasound;Therapeutic activities;Therapeutic exercise;Manual techniques;Patient/family education;Passive range of motion;Vasopneumatic Device    PT Next Visit Plan Continue AAROM protocol.    Consulted and Agree with Plan of Care Patient             Patient will benefit from skilled therapeutic intervention in order to improve the following deficits and impairments:  Decreased activity tolerance, Decreased range of motion, Pain, Increased edema  Visit Diagnosis: Acute pain of left shoulder  Stiffness of left shoulder, not elsewhere classified  Localized edema     Problem List Patient Active Problem List   Diagnosis Date Noted   Abdominal hernia 11/24/2021   Anemia 11/24/2021   Asthma 11/24/2021   Decreased estrogen level 11/24/2021   Syncope 11/24/2021   Sphenoid sinusitis 11/24/2021   Rotator cuff tear 11/22/2021   Full thickness rotator cuff tear 09/30/2021   Insomnia 09/23/2020   Dyslipidemia 09/23/2020   RLS (restless legs syndrome) 07/20/2020   Drug side effects, initial encounter 07/20/2020   Coronary atherosclerosis 03/18/2019    Post-traumatic headache, not intractable 12/04/2018   Grief 09/12/2018   Atrophic vaginitis 09/06/2017   Burning sensation of mouth 09/06/2017   Osteopenia 09/05/2016   Upper respiratory infection 09/05/2016   Chest pain 03/04/2016   Headache(784.0) 07/19/2013   Tinnitus 07/19/2013   Dry skin dermatitis 08/31/2012   Left hip pain 08/31/2012   Cerumen impaction 08/31/2012   Well adult exam 05/25/2012   Upper abdominal pain-chronic 04/02/2011   Thumb pain 03/11/2011   Donor, blood 03/11/2011   Shoulder pain 03/11/2011   Bilateral primary osteoarthritis of knee 09/10/2010   VERTIGO 03/05/2010   OSA on CPAP 07/22/2008   B12 deficiency 08/16/2007   Vitamin D deficiency 08/16/2007   Nonorganic sleep disorder 05/10/2007   SARCOIDOSIS, PULMONARY 05/09/2007   Hypothyroidism 05/09/2007   ALLERGIC RHINITIS 05/09/2007   GERD 05/09/2007   Personal history of colon polyps 05/09/2007   Rationale for Evaluation and Treatment Rehabilitation   Standley Brooking, [place] 12/31/2021, 9:38 AM  [facility] [address] [place], [place], [address] Phone: [phone]   Fax:  [phone]  Name: Elizabeth Gray MRN: [id number] Date of Birth: 15-Jun-1941   Progress Note Reporting Period 12/02/21 to 12/31/21.  See note below for Objective Data and Assessment  of Progress/Goals.  Patient progressing per protocol.  Left shoulder range of motion is improving very well.    Mali Applegate MPT

## 2022-01-04 ENCOUNTER — Institutional Professional Consult (permissible substitution): Payer: Medicare Other | Admitting: Diagnostic Neuroimaging

## 2022-01-05 ENCOUNTER — Encounter: Payer: Self-pay | Admitting: Physical Therapy

## 2022-01-05 ENCOUNTER — Ambulatory Visit: Payer: Medicare Other | Admitting: Physical Therapy

## 2022-01-05 DIAGNOSIS — R600 Localized edema: Secondary | ICD-10-CM

## 2022-01-05 DIAGNOSIS — M25512 Pain in left shoulder: Secondary | ICD-10-CM

## 2022-01-05 DIAGNOSIS — M25612 Stiffness of left shoulder, not elsewhere classified: Secondary | ICD-10-CM

## 2022-01-05 NOTE — Therapy (Signed)
[facility] [place], [place], [address] Phone: [phone]   Fax:  [phone]  Physical Therapy Treatment  Patient Details  Name: Elizabeth Gray MRN: [id number] Date of Birth: [birth year] Referring Provider (PT): Esmond Plants MD   Encounter Date: 01/05/2022   PT End of Session - 01/05/22 1347     Visit Number 11    Number of Visits 12    Date for PT Re-Evaluation 12/30/21    Authorization Type FOTO AT LEAST EVERY 5TH VISIT.  PROGRESS NOTE AT 10TH VISIT.  KX MODIFIER AFTER 15 VISITS.    PT Start Time 1345    PT Stop Time 1429    PT Time Calculation (min) 44 min    Activity Tolerance Patient tolerated treatment well    Behavior During Therapy WFL for tasks assessed/performed             Past Medical History:  Diagnosis Date   Allergy    Bursitis    Cancer ([place]) 03/22/2017   Melanoma in situ, lentigo maligna type   Deviated septum    External hemorrhoids without mention of complication 0177   Colonoscopy    Fatigue    GERD (gastroesophageal reflux disease)    Headache(784.0)    migraines   Heat rash    under the breasts.Marland Kitchenappeared on monday.Marland KitchenMarland KitchenBurning & itching, uses cortisone   Hypothyroidism    Iron deficiency anemia, unspecified    Lower esophageal ring 2008   EGD   Nonorganic sleep disorder, unspecified    Osteoarthritis    Osteoporosis    Pancreatitis    Personal history of colonic polyps    Polio 1948   Pulmonary sarcoidosis ([place])    Sleep apnea    [facility]   Vitamin B12 deficiency    Vitamin D deficiency     Past Surgical History:  Procedure Laterality Date   [place]  2002   rt   CHOLECYSTECTOMY  2000   COLONOSCOPY  12/19/2006   Multiple diminutive polyps destroyed-removed (hyperpastic), internal hemorrhoids   ESOPHAGOGASTRODUODENOSCOPY  12/19/2006   lower esophageal ring dilated   HAND SURGERY  1997   left    KNEE ARTHROSCOPY Bilateral    multiple 2 on lft 1 on rt   [place]   Melanoma Removal  Right 2018   right face   TOE SURGERY Left    left foot next to little toe  joint rem   TOTAL KNEE ARTHROPLASTY Right 05/09/2014   Procedure: RIGHT TOTAL KNEE ARTHROPLASTY;  Surgeon: [name], MD;  Location: [facility];  Service: Orthopedics;  Laterality: Right;   TOTAL KNEE ARTHROPLASTY Left 08/06/2021   Procedure: TOTAL KNEE ARTHROPLASTY;  Surgeon: [name], MD;  Location: [facility];  Service: Orthopedics;  Laterality: Left;   UPPER GASTROINTESTINAL ENDOSCOPY     WISDOM TOOTH EXTRACTION      There were no vitals filed for this visit.   Subjective Assessment - 01/05/22 1345     Subjective No pain today unless she horizontally adducts such as with showering or reaching.    Pertinent History Bilateral TKA's, CTR, lumbar surgery.    Patient Stated Goals Use left UE without pain.    Currently in Pain? No/denies                [facility] PT Assessment - 01/05/22 0001       Assessment  Medical Diagnosis Pain in left shoulder s/p surgery.    Referring Provider (PT) Esmond Plants MD    Onset Date/Surgical Date 11/29/21    Next MD Visit 01/11/2022      ROM / Strength   AROM / PROM / Strength AROM      AROM   Overall AROM  Within functional limits for tasks performed    AROM Assessment Site Shoulder    Right/Left Shoulder Left    Left Shoulder Flexion 160 Degrees    Left Shoulder Internal Rotation 80 Degrees    Left Shoulder External Rotation 80 Degrees                           [facility] Adult PT Treatment/Exercise - 01/05/22 0001       Shoulder Exercises: Supine   Protraction AROM;Left;20 reps    Flexion AROM;Left;20 reps      Shoulder Exercises: Standing   Internal Rotation AAROM;Both;20 reps    Flexion AAROM;Left;20 reps    ABduction AAROM;Left;20 reps    Extension AAROM;Both;20 reps    Other Standing Exercises LUE wall clock x10 reps       Shoulder Exercises: Pulleys   Flexion 5 minutes      Shoulder Exercises: ROM/Strengthening   Wall Wash CW, CCW x1 to fatigue each      Modalities   Modalities Vasopneumatic      Vasopneumatic   Number Minutes Vasopneumatic  10 minutes    Vasopnuematic Location  Shoulder    Vasopneumatic Pressure Low    Vasopneumatic Temperature  34                          PT Long Term Goals - 01/05/22 1417       PT LONG TERM GOAL #1   Title independent with a HEP.    Time 4    Period Weeks    Status Achieved      PT LONG TERM GOAL #2   Title Active left shoulder flexion to 145 degrees so the patient can easily reach overhead.    Time 4    Period Weeks    Status On-going      PT LONG TERM GOAL #3   Title Active ER to 70 degrees+ to allow for easily donning/doffing of apparel.    Time 4    Period Weeks    Status Achieved      PT LONG TERM GOAL #4   Title Increase ROM so patient is able to reach behind back to L3.    Time 4    Period Weeks    Status On-going      PT LONG TERM GOAL #5   Title Increase left shoulder strength to a solid 4+/5 to increase stability for performance of functional activities.    Time 4    Period Weeks    Status On-going      PT LONG TERM GOAL #6   Title Perform ADL's with pain not > 2-3/10.    Time 6    Period Weeks    Status Achieved                   Plan - 01/05/22 1418     Clinical Impression Statement Patient presented in clinic with no complaints of pain other than if she adducts for showering. Patient progressed to more antigravity AAROM and mild AROM exercises. Patient reported only mild  discomfort with AAROM IR behind her back. AROM measurements taken in supine. No sling donned upon arrival today. Normal vasopneumatic response noted following removal of the modality.    Personal Factors and Comorbidities Comorbidity 1;Comorbidity 2;Other    Comorbidities Bilateral TKA's, CTR, lumbar surgery.     Examination-Activity Limitations Other;Reach Overhead;Bathing;Dressing    Examination-Participation Restrictions Other;Meal Prep    Stability/Clinical Decision Making Stable/Uncomplicated    Rehab Potential Excellent    PT Frequency 3x / week    PT Duration 4 weeks    PT Treatment/Interventions ADLs/Self Care Home Management;Cryotherapy;Electrical Stimulation;Ultrasound;Therapeutic activities;Therapeutic exercise;Manual techniques;Patient/family education;Passive range of motion;Vasopneumatic Device    PT Next Visit Plan Continue AAROM/AROM protocol.    Consulted and Agree with Plan of Care Patient             Patient will benefit from skilled therapeutic intervention in order to improve the following deficits and impairments:  Decreased activity tolerance, Decreased range of motion, Pain, Increased edema  Visit Diagnosis: Acute pain of left shoulder  Stiffness of left shoulder, not elsewhere classified  Localized edema     Problem List Patient Active Problem List   Diagnosis Date Noted   Abdominal hernia 11/24/2021   Anemia 11/24/2021   Asthma 11/24/2021   Decreased estrogen level 11/24/2021   Syncope 11/24/2021   Sphenoid sinusitis 11/24/2021   Rotator cuff tear 11/22/2021   Full thickness rotator cuff tear 09/30/2021   Insomnia 09/23/2020   Dyslipidemia 09/23/2020   RLS (restless legs syndrome) 07/20/2020   Drug side effects, initial encounter 07/20/2020   Coronary atherosclerosis 03/18/2019   Post-traumatic headache, not intractable 12/04/2018   Grief 09/12/2018   Atrophic vaginitis 09/06/2017   Burning sensation of mouth 09/06/2017   Osteopenia 09/05/2016   Upper respiratory infection 09/05/2016   Chest pain 03/04/2016   Headache(784.0) 07/19/2013   Tinnitus 07/19/2013   Dry skin dermatitis 08/31/2012   Left hip pain 08/31/2012   Cerumen impaction 08/31/2012   Well adult exam 05/25/2012   Upper abdominal pain-chronic 04/02/2011   Thumb pain 03/11/2011    Donor, blood 03/11/2011   Shoulder pain 03/11/2011   Bilateral primary osteoarthritis of knee 09/10/2010   VERTIGO 03/05/2010   OSA on CPAP 07/22/2008   B12 deficiency 08/16/2007   Vitamin D deficiency 08/16/2007   Nonorganic sleep disorder 05/10/2007   SARCOIDOSIS, PULMONARY 05/09/2007   Hypothyroidism 05/09/2007   ALLERGIC RHINITIS 05/09/2007   GERD 05/09/2007   Personal history of colon polyps 05/09/2007   Rationale for Evaluation and Treatment Rehabilitation   Standley Brooking, PTA 01/05/2022, 2:42 PM  [facility] [address] [place], [place], [address] Phone: [phone]   Fax:  [phone]  Name: Elizabeth Gray MRN: [id number] Date of Birth: 1941-03-21

## 2022-01-05 NOTE — Addendum Note (Signed)
Addended by: Khadeja Abt, [place] W on: 01/05/2022 03:03 PM   Modules accepted: Orders

## 2022-01-06 ENCOUNTER — Ambulatory Visit: Payer: Medicare Other | Admitting: Physical Therapy

## 2022-01-06 ENCOUNTER — Encounter: Payer: Self-pay | Admitting: Physical Therapy

## 2022-01-06 DIAGNOSIS — M25612 Stiffness of left shoulder, not elsewhere classified: Secondary | ICD-10-CM | POA: Diagnosis not present

## 2022-01-06 DIAGNOSIS — M25512 Pain in left shoulder: Secondary | ICD-10-CM

## 2022-01-06 DIAGNOSIS — R600 Localized edema: Secondary | ICD-10-CM | POA: Diagnosis not present

## 2022-01-06 NOTE — Therapy (Signed)
[facility] [place], [place], [address] Phone: [phone]   Fax:  [phone]  Physical Therapy Treatment  Patient Details  Name: Elizabeth Gray MRN: [id number] Date of Birth: [birth year] Referring Provider (PT): Esmond Plants MD   Encounter Date: 01/06/2022   PT End of Session - 01/06/22 1352     Visit Number 12    Number of Visits 18    Date for PT Re-Evaluation 01/27/22    Authorization Type FOTO AT LEAST EVERY 5TH VISIT.  PROGRESS NOTE AT 10TH VISIT.  KX MODIFIER AFTER 15 VISITS.    PT Start Time 1347    PT Stop Time 1427    PT Time Calculation (min) 40 min    Activity Tolerance Patient tolerated treatment well    Behavior During Therapy WFL for tasks assessed/performed             Past Medical History:  Diagnosis Date   Allergy    Bursitis    Cancer ([place]) 03/22/2017   Melanoma in situ, lentigo maligna type   Deviated septum    External hemorrhoids without mention of complication 5170   Colonoscopy    Fatigue    GERD (gastroesophageal reflux disease)    Headache(784.0)    migraines   Heat rash    under the breasts.Marland Kitchenappeared on monday.Marland KitchenMarland KitchenBurning & itching, uses cortisone   Hypothyroidism    Iron deficiency anemia, unspecified    Lower esophageal ring 2008   EGD   Nonorganic sleep disorder, unspecified    Osteoarthritis    Osteoporosis    Pancreatitis    Personal history of colonic polyps    Polio 1948   Pulmonary sarcoidosis ([place])    Sleep apnea    [facility]   Vitamin B12 deficiency    Vitamin D deficiency     Past Surgical History:  Procedure Laterality Date   [place]  2002   rt   CHOLECYSTECTOMY  2000   COLONOSCOPY  12/19/2006   Multiple diminutive polyps destroyed-removed (hyperpastic), internal hemorrhoids   ESOPHAGOGASTRODUODENOSCOPY  12/19/2006   lower esophageal ring dilated   HAND SURGERY  1997   left    KNEE ARTHROSCOPY Bilateral    multiple 2 on lft 1 on rt   [place]   Melanoma Removal  Right 2018   right face   TOE SURGERY Left    left foot next to little toe  joint rem   TOTAL KNEE ARTHROPLASTY Right 05/09/2014   Procedure: RIGHT TOTAL KNEE ARTHROPLASTY;  Surgeon: [name], MD;  Location: [facility];  Service: Orthopedics;  Laterality: Right;   TOTAL KNEE ARTHROPLASTY Left 08/06/2021   Procedure: TOTAL KNEE ARTHROPLASTY;  Surgeon: [name], MD;  Location[facility];  Service: Orthopedics;  Laterality: Left;   UPPER GASTROINTESTINAL ENDOSCOPY     WISDOM TOOTH EXTRACTION      There were no vitals filed for this visit.   Subjective Assessment - 01/06/22 1441     Subjective Reports of a little soreness after last PT session.    Pertinent History Bilateral TKA's, CTR, lumbar surgery.    Patient Stated Goals Use left UE without pain.    Currently in Pain? Yes    Pain Score --   no pain score provided   Pain Location Shoulder    Pain Orientation Left    Pain Descriptors / Indicators Sore  Pain Type Surgical pain    Pain Onset 1 to 4 weeks ago    Pain Frequency Intermittent                [facility] PT Assessment - 01/06/22 0001       Assessment   Medical Diagnosis Pain in left shoulder s/p surgery.    Referring Provider (PT) Esmond Plants MD    Onset Date/Surgical Date 11/29/21    Next MD Visit 01/11/2022                           [facility] Adult PT Treatment/Exercise - 01/06/22 0001       Shoulder Exercises: Supine   Protraction AROM;Left;20 reps    Flexion AROM;Left;20 reps      Shoulder Exercises: Prone   Retraction AROM;Left;20 reps    Extension AROM;Left;20 reps    Horizontal ABduction 1 AROM;Left;20 reps      Shoulder Exercises: Sidelying   External Rotation AROM;Left;20 reps      Shoulder Exercises: Standing   Other Standing Exercises LUE wall clock x10 reps      Shoulder Exercises: Pulleys   Flexion 5 minutes       Shoulder Exercises: ROM/Strengthening   Wall Wash CW, CCW x1 to fatigue each      Modalities   Modalities Vasopneumatic      Vasopneumatic   Number Minutes Vasopneumatic  10 minutes    Vasopnuematic Location  Shoulder    Vasopneumatic Pressure Low    Vasopneumatic Temperature  34                          PT Long Term Goals - 01/05/22 1417       PT LONG TERM GOAL #1   Title independent with a HEP.    Time 4    Period Weeks    Status Achieved      PT LONG TERM GOAL #2   Title Active left shoulder flexion to 145 degrees so the patient can easily reach overhead.    Time 4    Period Weeks    Status On-going      PT LONG TERM GOAL #3   Title Active ER to 70 degrees+ to allow for easily donning/doffing of apparel.    Time 4    Period Weeks    Status Achieved      PT LONG TERM GOAL #4   Title Increase ROM so patient is able to reach behind back to L3.    Time 4    Period Weeks    Status On-going      PT LONG TERM GOAL #5   Title Increase left shoulder strength to a solid 4+/5 to increase stability for performance of functional activities.    Time 4    Period Weeks    Status On-going      PT LONG TERM GOAL #6   Title Perform ADL's with pain not > 2-3/10.    Time 6    Period Weeks    Status Achieved                   Plan - 01/06/22 1438     Clinical Impression Statement Patient presented in clinic with mild soreness after progression of protocol yesterday. Patient only reporting the soreness being notable with therex. No other complaints during therex. Great ROM noted throughout therex session as well. Patient progressing  very well with only minimal limitations with adduction to wash opposite UE and extreme IR. Normal vasopnuematic response noted following removal of the modality.    Personal Factors and Comorbidities Comorbidity 1;Comorbidity 2;Other    Comorbidities Bilateral TKA's, CTR, lumbar surgery.    Examination-Activity Limitations  Other;Reach Overhead;Bathing;Dressing    Examination-Participation Restrictions Other;Meal Prep    Stability/Clinical Decision Making Stable/Uncomplicated    Rehab Potential Excellent    PT Frequency 3x / week    PT Duration 4 weeks    PT Treatment/Interventions ADLs/Self Care Home Management;Cryotherapy;Electrical Stimulation;Ultrasound;Therapeutic activities;Therapeutic exercise;Manual techniques;Patient/family education;Passive range of motion;Vasopneumatic Device    PT Next Visit Plan Continue AAROM/AROM protocol.    Consulted and Agree with Plan of Care Patient             Patient will benefit from skilled therapeutic intervention in order to improve the following deficits and impairments:  Decreased activity tolerance, Decreased range of motion, Pain, Increased edema  Visit Diagnosis: Acute pain of left shoulder  Stiffness of left shoulder, not elsewhere classified  Localized edema     Problem List Patient Active Problem List   Diagnosis Date Noted   Abdominal hernia 11/24/2021   Anemia 11/24/2021   Asthma 11/24/2021   Decreased estrogen level 11/24/2021   Syncope 11/24/2021   Sphenoid sinusitis 11/24/2021   Rotator cuff tear 11/22/2021   Full thickness rotator cuff tear 09/30/2021   Insomnia 09/23/2020   Dyslipidemia 09/23/2020   RLS (restless legs syndrome) 07/20/2020   Drug side effects, initial encounter 07/20/2020   Coronary atherosclerosis 03/18/2019   Post-traumatic headache, not intractable 12/04/2018   Grief 09/12/2018   Atrophic vaginitis 09/06/2017   Burning sensation of mouth 09/06/2017   Osteopenia 09/05/2016   Upper respiratory infection 09/05/2016   Chest pain 03/04/2016   Headache(784.0) 07/19/2013   Tinnitus 07/19/2013   Dry skin dermatitis 08/31/2012   Left hip pain 08/31/2012   Cerumen impaction 08/31/2012   Well adult exam 05/25/2012   Upper abdominal pain-chronic 04/02/2011   Thumb pain 03/11/2011   Donor, blood 03/11/2011    Shoulder pain 03/11/2011   Bilateral primary osteoarthritis of knee 09/10/2010   VERTIGO 03/05/2010   OSA on CPAP 07/22/2008   B12 deficiency 08/16/2007   Vitamin D deficiency 08/16/2007   Nonorganic sleep disorder 05/10/2007   SARCOIDOSIS, PULMONARY 05/09/2007   Hypothyroidism 05/09/2007   ALLERGIC RHINITIS 05/09/2007   GERD 05/09/2007   Personal history of colon polyps 05/09/2007   Rationale for Evaluation and Treatment Rehabilitation  Standley Brooking, [place] 01/06/2022, 2:44 PM  [facility] [address] [place], [place], [address] Phone: [phone]   Fax:  [phone]  Name: Elizabeth Gray MRN: [id number] Date of Birth: [birth year]

## 2022-01-10 ENCOUNTER — Ambulatory Visit (INDEPENDENT_AMBULATORY_CARE_PROVIDER_SITE_OTHER): Payer: Medicare Other | Admitting: Diagnostic Neuroimaging

## 2022-01-10 ENCOUNTER — Encounter: Payer: Self-pay | Admitting: Diagnostic Neuroimaging

## 2022-01-10 VITALS — BP 122/81 | HR 94 | Ht 63.0 in | Wt 150.0 lb

## 2022-01-10 DIAGNOSIS — R55 Syncope and collapse: Secondary | ICD-10-CM | POA: Diagnosis not present

## 2022-01-10 NOTE — Patient Instructions (Addendum)
  UNPROVOKED LOSS OF CONSCIOUSNESS (11/23/21) --> ddx: syncope, seizure, medication side effect - check MRI brain, EEG  - consider cardiology consult / workup  - According to [place] law, you can not drive unless you are seizure / syncope free for at least 6 months and under physician's care.   - Please maintain precautions. Do not participate in activities where a loss of awareness could harm you or someone else. No swimming alone, no tub bathing, no hot tubs, no driving, no operating motorized vehicles (cars, ATVs, motocycles, etc), lawnmowers, power tools or firearms. No standing at heights, such as rooftops, ladders or stairs. Avoid hot objects such as stoves, heaters, open fires. Wear a helmet when riding a bicycle, scooter, skateboard, etc. and avoid areas of traffic. Set your water heater to 120 degrees or less.    MIGRAINE - stable; now off topiramate; monitor
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
## 2022-01-11 ENCOUNTER — Telehealth: Payer: Self-pay | Admitting: Diagnostic Neuroimaging

## 2022-01-11 NOTE — Telephone Encounter (Signed)
medicare/aarp NPR sent to GI

## 2022-01-14 ENCOUNTER — Ambulatory Visit: Payer: Medicare Other | Admitting: Physical Therapy

## 2022-01-14 ENCOUNTER — Encounter: Payer: Self-pay | Admitting: Physical Therapy

## 2022-01-14 DIAGNOSIS — M25612 Stiffness of left shoulder, not elsewhere classified: Secondary | ICD-10-CM | POA: Diagnosis not present

## 2022-01-14 DIAGNOSIS — M25512 Pain in left shoulder: Secondary | ICD-10-CM

## 2022-01-14 DIAGNOSIS — R600 Localized edema: Secondary | ICD-10-CM

## 2022-01-14 DIAGNOSIS — M810 Age-related osteoporosis without current pathological fracture: Secondary | ICD-10-CM | POA: Diagnosis not present

## 2022-01-14 LAB — HM DEXA SCAN

## 2022-01-14 NOTE — Therapy (Signed)
[facility] [place], [place], [address] Phone: [phone]   Fax:  [phone]  Physical Therapy Treatment  Patient Details  Name: Elizabeth Gray MRN: [id number] Date of Birth: 03/14/1941 Referring Provider (PT): Esmond Plants MD   Encounter Date: 01/14/2022   PT End of Session - 01/14/22 0947     Visit Number 13    Number of Visits 18    Date for PT Re-Evaluation 01/27/22    Authorization Type FOTO AT LEAST EVERY 5TH VISIT.  PROGRESS NOTE AT 10TH VISIT.  KX MODIFIER AFTER 15 VISITS.    PT Start Time 0945    PT Stop Time 1028    PT Time Calculation (min) 43 min    Activity Tolerance Patient tolerated treatment well    Behavior During Therapy WFL for tasks assessed/performed             Past Medical History:  Diagnosis Date   Allergy    Bursitis    Cancer ([place]) 03/22/2017   Melanoma in situ, lentigo maligna type   Deviated septum    External hemorrhoids without mention of complication 2122   Colonoscopy    Fatigue    GERD (gastroesophageal reflux disease)    Headache(784.0)    migraines   Heat rash    under the breasts.Marland Kitchenappeared on monday.Marland KitchenMarland KitchenBurning & itching, uses cortisone   Hypothyroidism    Iron deficiency anemia, unspecified    Lower esophageal ring 2008   EGD   Nonorganic sleep disorder, unspecified    Osteoarthritis    Osteoporosis    Pancreatitis    Personal history of colonic polyps    Polio 1948   Pulmonary sarcoidosis ([place])    Sleep apnea    cpap since 09 [facility]   Vitamin B12 deficiency    Vitamin D deficiency     Past Surgical History:  Procedure Laterality Date   [place]  2002   rt   CHOLECYSTECTOMY  2000   COLONOSCOPY  12/19/2006   Multiple diminutive polyps destroyed-removed (hyperpastic), internal hemorrhoids   ESOPHAGOGASTRODUODENOSCOPY  12/19/2006   lower esophageal ring dilated   HAND SURGERY  1997   left    KNEE ARTHROSCOPY Bilateral    multiple 2 on lft 1 on rt   [place]   Melanoma Removal  Right 2018   right face   TOE SURGERY Left    left foot next to little toe  joint rem   TOTAL KNEE ARTHROPLASTY Right 05/09/2014   Procedure: RIGHT TOTAL KNEE ARTHROPLASTY;  Surgeon: [name], MD;  Location: [facility];  Service: Orthopedics;  Laterality: Right;   TOTAL KNEE ARTHROPLASTY Left 08/06/2021   Procedure: TOTAL KNEE ARTHROPLASTY;  Surgeon: [name], MD;  Location: [facility];  Service: Orthopedics;  Laterality: Left;   UPPER GASTROINTESTINAL ENDOSCOPY     WISDOM TOOTH EXTRACTION      There were no vitals filed for this visit.   Subjective Assessment - 01/14/22 0946     Subjective Had a great report from MD who wishes her to do once a week every 2 weeks.    Pertinent History Bilateral TKA's, CTR, lumbar surgery.    Patient Stated Goals Use left UE without pain.    Currently in Pain? No/denies                [facility] PT Assessment - 01/14/22 0001  Assessment   Medical Diagnosis Pain in left shoulder s/p surgery.    Referring Provider (PT) Esmond Plants MD    Onset Date/Surgical Date 11/29/21    Next MD Visit 02/23/2022                           [facility] Adult PT Treatment/Exercise - 01/14/22 0001       Shoulder Exercises: Standing   Protraction Strengthening;Left;20 reps;Theraband    Theraband Level (Shoulder Protraction) Level 1 (Yellow)    External Rotation Strengthening;Left;20 reps;Theraband    Theraband Level (Shoulder External Rotation) Level 1 (Yellow)    Internal Rotation Strengthening;Left;20 reps;Theraband    Theraband Level (Shoulder Internal Rotation) Level 1 (Yellow)    Flexion Strengthening;Left;20 reps;Weights    Shoulder Flexion Weight (lbs) 1    ABduction Strengthening;Left;20 reps;Weights    Shoulder ABduction Weight (lbs) 1    Diagonals AROM;Left;20 reps      Shoulder Exercises: Pulleys   Flexion 5 minutes       Shoulder Exercises: ROM/Strengthening   Wall Wash CW, CCW x2 to fatigue each      Modalities   Modalities Vasopneumatic      Vasopneumatic   Number Minutes Vasopneumatic  10 minutes    Vasopnuematic Location  Shoulder    Vasopneumatic Pressure Low    Vasopneumatic Temperature  34                          PT Long Term Goals - 01/05/22 1417       PT LONG TERM GOAL #1   Title independent with a HEP.    Time 4    Period Weeks    Status Achieved      PT LONG TERM GOAL #2   Title Active left shoulder flexion to 145 degrees so the patient can easily reach overhead.    Time 4    Period Weeks    Status On-going      PT LONG TERM GOAL #3   Title Active ER to 70 degrees+ to allow for easily donning/doffing of apparel.    Time 4    Period Weeks    Status Achieved      PT LONG TERM GOAL #4   Title Increase ROM so patient is able to reach behind back to L3.    Time 4    Period Weeks    Status On-going      PT LONG TERM GOAL #5   Title Increase left shoulder strength to a solid 4+/5 to increase stability for performance of functional activities.    Time 4    Period Weeks    Status On-going      PT LONG TERM GOAL #6   Title Perform ADL's with pain not > 2-3/10.    Time 6    Period Weeks    Status Achieved                   Plan - 01/14/22 1128     Clinical Impression Statement Patient presented in clinic with no complaints and had an great check up with MD. Patient progresed to antigravity AROM and light strengthening. Intermittant multimodal cues required to correct technique. Patient did indicate mild discomfort with L shoulder abduction and D2 AROM. Normal vasopneumatic response noted following removal of the modality.    Personal Factors and Comorbidities Comorbidity 1;Comorbidity 2;Other    Comorbidities Bilateral TKA's, CTR,  lumbar surgery.    Examination-Activity Limitations Other;Reach Overhead;Bathing;Dressing    Examination-Participation  Restrictions Other;Meal Prep    Stability/Clinical Decision Making Stable/Uncomplicated    Rehab Potential Excellent    PT Frequency 3x / week    PT Duration 4 weeks    PT Treatment/Interventions ADLs/Self Care Home Management;Cryotherapy;Electrical Stimulation;Ultrasound;Therapeutic activities;Therapeutic exercise;Manual techniques;Patient/family education;Passive range of motion;Vasopneumatic Device    PT Next Visit Plan Contine AROM/strengthening    Consulted and Agree with Plan of Care Patient             Patient will benefit from skilled therapeutic intervention in order to improve the following deficits and impairments:  Decreased activity tolerance, Decreased range of motion, Pain, Increased edema  Visit Diagnosis: Acute pain of left shoulder  Stiffness of left shoulder, not elsewhere classified  Localized edema     Problem List Patient Active Problem List   Diagnosis Date Noted   Abdominal hernia 11/24/2021   Anemia 11/24/2021   Asthma 11/24/2021   Decreased estrogen level 11/24/2021   Syncope 11/24/2021   Sphenoid sinusitis 11/24/2021   Rotator cuff tear 11/22/2021   Full thickness rotator cuff tear 09/30/2021   Insomnia 09/23/2020   Dyslipidemia 09/23/2020   RLS (restless legs syndrome) 07/20/2020   Drug side effects, initial encounter 07/20/2020   Coronary atherosclerosis 03/18/2019   Post-traumatic headache, not intractable 12/04/2018   Grief 09/12/2018   Atrophic vaginitis 09/06/2017   Burning sensation of mouth 09/06/2017   Osteopenia 09/05/2016   Upper respiratory infection 09/05/2016   Chest pain 03/04/2016   Headache(784.0) 07/19/2013   Tinnitus 07/19/2013   Dry skin dermatitis 08/31/2012   Left hip pain 08/31/2012   Cerumen impaction 08/31/2012   Well adult exam 05/25/2012   Upper abdominal pain-chronic 04/02/2011   Thumb pain 03/11/2011   Donor, blood 03/11/2011   Shoulder pain 03/11/2011   Bilateral primary osteoarthritis of knee  09/10/2010   VERTIGO 03/05/2010   OSA on CPAP 07/22/2008   B12 deficiency 08/16/2007   Vitamin D deficiency 08/16/2007   Nonorganic sleep disorder 05/10/2007   SARCOIDOSIS, PULMONARY 05/09/2007   Hypothyroidism 05/09/2007   ALLERGIC RHINITIS 05/09/2007   GERD 05/09/2007   Personal history of colon polyps 05/09/2007   Rationale for Evaluation and Treatment Rehabilitation   Standley Brooking, PTA 01/14/2022, 11:45 AM  [facility] [address] [place], [place], [address] Phone: [phone]   Fax:  [phone]  Name: Elizabeth Gray MRN: [id number] Date of Birth: [birth year]

## 2022-01-17 ENCOUNTER — Ambulatory Visit (INDEPENDENT_AMBULATORY_CARE_PROVIDER_SITE_OTHER): Payer: Medicare Other | Admitting: Diagnostic Neuroimaging

## 2022-01-17 DIAGNOSIS — R55 Syncope and collapse: Secondary | ICD-10-CM

## 2022-01-21 DIAGNOSIS — H40013 Open angle with borderline findings, low risk, bilateral: Secondary | ICD-10-CM | POA: Diagnosis not present

## 2022-01-21 DIAGNOSIS — H353131 Nonexudative age-related macular degeneration, bilateral, early dry stage: Secondary | ICD-10-CM | POA: Diagnosis not present

## 2022-01-24 ENCOUNTER — Ambulatory Visit (INDEPENDENT_AMBULATORY_CARE_PROVIDER_SITE_OTHER): Payer: Medicare Other | Admitting: Internal Medicine

## 2022-01-24 ENCOUNTER — Encounter: Payer: Self-pay | Admitting: Internal Medicine

## 2022-01-24 VITALS — BP 132/70 | HR 70 | Temp 97.8°F | Ht 63.0 in | Wt 146.0 lb

## 2022-01-24 DIAGNOSIS — R002 Palpitations: Secondary | ICD-10-CM | POA: Diagnosis not present

## 2022-01-24 DIAGNOSIS — F5101 Primary insomnia: Secondary | ICD-10-CM | POA: Diagnosis not present

## 2022-01-24 DIAGNOSIS — E039 Hypothyroidism, unspecified: Secondary | ICD-10-CM

## 2022-01-24 DIAGNOSIS — R55 Syncope and collapse: Secondary | ICD-10-CM | POA: Diagnosis not present

## 2022-01-24 DIAGNOSIS — M75112 Incomplete rotator cuff tear or rupture of left shoulder, not specified as traumatic: Secondary | ICD-10-CM

## 2022-01-24 LAB — COMPREHENSIVE METABOLIC PANEL
ALT: 19 U/L (ref 0–35)
AST: 25 U/L (ref 0–37)
Albumin: 4.2 g/dL (ref 3.5–5.2)
Alkaline Phosphatase: 81 U/L (ref 39–117)
BUN: 26 mg/dL — ABNORMAL HIGH (ref 6–23)
CO2: 32 mEq/L (ref 19–32)
Calcium: 9.5 mg/dL (ref 8.4–10.5)
Chloride: 100 mEq/L (ref 96–112)
Creatinine, Ser: 0.9 mg/dL (ref 0.40–1.20)
GFR: 59.25 mL/min — ABNORMAL LOW (ref >60.00)
Glucose, Bld: 91 mg/dL (ref 70–99)
Potassium: 4.6 mEq/L (ref 3.5–5.1)
Sodium: 140 mEq/L (ref 135–145)
Total Bilirubin: 0.4 mg/dL (ref 0.2–1.2)
Total Protein: 7.3 g/dL (ref 6.0–8.3)

## 2022-01-24 LAB — CBC WITH DIFFERENTIAL/PLATELET
Basophils Absolute: 0.1 10*3/uL (ref 0.0–0.1)
Basophils Relative: 0.8 % (ref 0.0–3.0)
Eosinophils Absolute: 0.3 10*3/uL (ref 0.0–0.7)
Eosinophils Relative: 3.6 % (ref 0.0–5.0)
HCT: 36.3 % (ref 36.0–46.0)
Hemoglobin: 12 g/dL (ref 12.0–15.0)
Lymphocytes Relative: 33.1 % (ref 12.0–46.0)
Lymphs Abs: 2.6 10*3/uL (ref 0.7–4.0)
MCHC: 33.1 g/dL (ref 30.0–36.0)
MCV: 84.1 fl (ref 78.0–100.0)
Monocytes Absolute: 1 10*3/uL (ref 0.1–1.0)
Monocytes Relative: 12.3 % — ABNORMAL HIGH (ref 3.0–12.0)
Neutro Abs: 4 10*3/uL (ref 1.4–7.7)
Neutrophils Relative %: 50.2 % (ref 43.0–77.0)
Platelets: 332 10*3/uL (ref 150.0–400.0)
RBC: 4.31 Mil/uL (ref 3.87–5.11)
RDW: 14.7 % (ref 11.5–15.5)
WBC: 7.9 10*3/uL (ref 4.0–10.5)

## 2022-01-24 LAB — T4, FREE: Free T4: 0.85 ng/dL (ref 0.60–1.60)

## 2022-01-24 LAB — MAGNESIUM: Magnesium: 2.6 mg/dL — ABNORMAL HIGH (ref 1.5–2.5)

## 2022-01-24 MED ORDER — METOPROLOL SUCCINATE ER 25 MG PO TB24
12.5000 mg | ORAL_TABLET | Freq: Every evening | ORAL | 3 refills | Status: DC
Start: 2022-01-24 — End: 2022-02-21

## 2022-01-24 NOTE — Progress Notes (Signed)
Subjective:  Patient ID: Elizabeth Gray, female    DOB: 1941-05-02  Age: 81 y.o. MRN: [id number]  CC: Follow-up   HPI Elizabeth Gray presents for syncope - no relapse. Pt saw Dr Marjory Lies, had EEG. Brain MRI and cardiology appt is pending S/p repair L shoulder rotator cuff on 11/29/21 In PT C/o HR 100 at night often; 30 min duration - x 2-3 wks; h/o having it rare in the past   Outpatient Medications Prior to Visit  Medication Sig Dispense Refill   Acetaminophen (TYLENOL PO) Take by mouth.     Alum Hydroxide-Mag Carbonate (GAVISCON PO) Take 1 tablet by mouth 3 (three) times daily after meals.     ASPIRIN 81 PO Take by mouth.     Cholecalciferol (VITAMIN D PO) Take 1,000 Units by mouth every other day.      cyanocobalamin (,VITAMIN B-12,) 1000 MCG/ML injection INJECT 1 ML INTRAMUSCULARLY EVERY14 DAYS 2 mL 3   Flaxseed, Linseed, (FLAXSEED OIL) 1000 MG CAPS Take 1,000 mg by mouth daily.     Glucosamine-Chondroitin (MOVE FREE PO) Take 1 tablet by mouth in the morning and at bedtime.     levothyroxine (SYNTHROID) 100 MCG tablet Take 1 tablet (100 mcg total) by mouth daily. 90 tablet 3   magnesium oxide (MAG-OX) 400 MG tablet Take 400 mg by mouth at bedtime.     Multiple Vitamins-Minerals (ICAPS AREDS FORMULA PO) Take 1 capsule by mouth in the morning and at bedtime.     omeprazole-sodium bicarbonate (ZEGERID) 40-1100 MG capsule TAKE 1 CAPSULE BY MOUTH ONCE DAILY BEFORE BREAKFAST** PATIENT NEEDS OFFICE VISIT 90 capsule 0   Polyethyl Glycol-Propyl Glycol 0.4-0.3 % SOLN Place 2-3 drops into both eyes 2 (two) times daily as needed (dry eyes).     pramipexole (MIRAPEX) 1 MG tablet Take 0.5-1 tablets (0.5-1 mg total) by mouth at bedtime. 90 tablet 3   pravastatin (PRAVACHOL) 20 MG tablet Take 1 tablet (20 mg total) by mouth daily. 90 tablet 3   SYRINGE-NEEDLE, DISP, 3 ML (B-D 3CC LUER-LOK SYR 25GX1") 25G X 1" 3 ML MISC USE AS DIRECTED 8 each 0   traMADol (ULTRAM) 50 MG tablet Take 1-2  tablets (50-100 mg total) by mouth every 6 (six) hours as needed for moderate pain or severe pain. 40 tablet 0   zolpidem (AMBIEN) 10 MG tablet Take 1 tablet (10 mg total) by mouth at bedtime as needed. for sleep 90 tablet 1   cefdinir (OMNICEF) 300 MG capsule Take 1 capsule (300 mg total) by mouth 2 (two) times daily. (Patient not taking: Reported on 01/24/2022) 28 capsule 0   MAGNESIUM PO Take by mouth.     No facility-administered medications prior to visit.    ROS: Review of Systems  Constitutional:  Negative for activity change, appetite change, chills, fatigue and unexpected weight change.  HENT:  Negative for congestion, mouth sores and sinus pressure.   Eyes:  Negative for visual disturbance.  Respiratory:  Negative for cough, chest tightness and shortness of breath.   Cardiovascular:  Positive for palpitations.  Gastrointestinal:  Negative for abdominal pain and nausea.  Genitourinary:  Negative for difficulty urinating, frequency and vaginal pain.  Musculoskeletal:  Negative for back pain and gait problem.  Skin:  Negative for pallor and rash.  Neurological:  Negative for dizziness, tremors, seizures, syncope, facial asymmetry, speech difficulty, weakness, light-headedness, numbness and headaches.  Hematological:  Does not bruise/bleed easily.  Psychiatric/Behavioral:  Negative for confusion, decreased concentration and sleep  disturbance.     Objective:  BP 132/70 (BP Location: Left Arm, Patient Position: Sitting, Cuff Size: Large)   Pulse 70   Temp 97.8 F (36.6 C) (Temporal)   Ht 5\' 3"  (1.6 m)   Wt 146 lb (66.2 kg)   SpO2 98%   BMI 25.86 kg/m   BP Readings from Last 3 Encounters:  01/24/22 132/70  01/10/22 122/81  11/24/21 130/70    Wt Readings from Last 3 Encounters:  01/24/22 146 lb (66.2 kg)  01/10/22 150 lb (68 kg)  11/24/21 147 lb (66.7 kg)    Physical Exam Constitutional:      General: Elizabeth Gray is not in acute distress.    Appearance: Elizabeth Gray is  well-developed.  HENT:     Head: Normocephalic.     Right Ear: External ear normal.     Left Ear: External ear normal.     Nose: Nose normal.  Eyes:     General:        Right eye: No discharge.        Left eye: No discharge.     Conjunctiva/sclera: Conjunctivae normal.     Pupils: Pupils are equal, round, and reactive to light.  Neck:     Thyroid: No thyromegaly.     Vascular: No JVD.     Trachea: No tracheal deviation.  Cardiovascular:     Rate and Rhythm: Normal rate and regular rhythm.     Heart sounds: Normal heart sounds.  Pulmonary:     Effort: No respiratory distress.     Breath sounds: No stridor. No wheezing.  Abdominal:     General: Bowel sounds are normal. There is no distension.     Palpations: Abdomen is soft. There is no mass.     Tenderness: There is no abdominal tenderness. There is no guarding or rebound.  Musculoskeletal:        General: No tenderness.     Cervical back: Normal range of motion and neck supple. No rigidity.  Lymphadenopathy:     Cervical: No cervical adenopathy.  Skin:    Findings: No erythema or rash.  Neurological:     Cranial Nerves: No cranial nerve deficit.     Motor: No abnormal muscle tone.     Coordination: Coordination normal.     Deep Tendon Reflexes: Reflexes normal.  Psychiatric:        Behavior: Behavior normal.        Thought Content: Thought content normal.        Judgment: Judgment normal.     Lab Results  Component Value Date   WBC 10.1 11/23/2021   HGB 11.9 (L) 11/23/2021   HCT 37.9 11/23/2021   PLT 347 11/23/2021   GLUCOSE 133 (H) 11/23/2021   CHOL 146 11/22/2021   TRIG 91.0 11/22/2021   HDL 65.60 11/22/2021   LDLCALC 63 11/22/2021   ALT 20 11/23/2021   AST 26 11/23/2021   NA 138 11/23/2021   K 4.5 11/23/2021   CL 100 11/23/2021   CREATININE 0.96 11/23/2021   BUN 35 (H) 11/23/2021   CO2 30 11/23/2021   TSH 7.39 (H) 11/22/2021   INR 3.03 (H) 05/12/2014    CT Head Wo Contrast  Result Date:  11/23/2021 CLINICAL DATA:  Recurrent dizziness. EXAM: CT HEAD WITHOUT CONTRAST TECHNIQUE: Contiguous axial images were obtained from the base of the skull through the vertex without intravenous contrast. RADIATION DOSE REDUCTION: This exam was performed according to the departmental dose-optimization program which includes automated  exposure control, adjustment of the mA and/or kV according to patient size and/or use of iterative reconstruction technique. COMPARISON:  Brain MRI dated 10/10/2014 FINDINGS: Brain: Anteriorly in the posterior cranial fossa and tracking along the left cerebellopontine angle, there is accentuated CSF density which on prior MRI appears to have low FLAIR signal intensity and low (but minimally higher than CSF) diffusion weighted signal intensity generally favoring arachnoid cyst over epidermoid. At about 1.3 cm in thickness on image 5 series 2, there is no progression in size compared to the 10/10/2014 MRI. Faint punctate calcifications along the globus pallidus nuclei, likely physiologic. Otherwise, the brainstem, cerebellum, cerebral peduncles, thalamus, basal ganglia, basilar cisterns, and ventricular system appear within normal limits. No intracranial hemorrhage or acute CVA. Vascular: There is atherosclerotic calcification of the cavernous carotid arteries bilaterally. Skull: Unremarkable. Incidental prominence the right jugular foramen. Sinuses/Orbits: Chronic left sphenoid sinusitis. Other: No supplemental non-categorized findings. IMPRESSION: 1. Chronically stable arachnoid cyst along the left cerebellopontine and anteriorly in the left posterior cranial fossa, about 1.3 cm in thickness, not substantially changed from 10/10/2014. These can rarely contribute to symptoms from mass effect. 2. Mild chronic left sphenoid sinusitis. 3. Atherosclerosis. Electronically Signed   By: Gaylyn Rong M.D.   On: 11/23/2021 11:20   DG Chest 2 View  Result Date: 11/23/2021 CLINICAL DATA:   Short of breath, syncope EXAM: CHEST - 2 VIEW COMPARISON:  06/16/2021 FINDINGS: The heart size and mediastinal contours are within normal limits. Both lungs are clear. The visualized skeletal structures are unremarkable. IMPRESSION: No active cardiopulmonary disease. Electronically Signed   By: Marlan Palau M.D.   On: 11/23/2021 11:06    Assessment & Plan:   Problem List Items Addressed This Visit     Insomnia    Chronic w/RLS Cont on Zolpidem   Potential benefits of a long term benzodiazepines  use as well as potential risks  and complications were explained to the patient and were aknowledged.      Palpitations    Possible S tachy or SVT episodes Appt w/Dr Jens Som pending Try empiric Toprol XL low dose C/o HR 100 at night often; 30 min duration - x 2-3 wks; h/o having it rare in the past      Rotator cuff tear    S/p repair L 11/29/21 In PT      Syncope    Pt saw Dr Marjory Lies, had EEG. Brain MRI and cardiology appt is pending. No relapse         Meds ordered this encounter  Medications   metoprolol succinate (TOPROL-XL) 25 MG 24 hr tablet    Sig: Take 0.5 tablets (12.5 mg total) by mouth at bedtime.    Dispense:  45 tablet    Refill:  3      Follow-up: Return in about 4 months (around 05/26/2022) for a follow-up visit.  Sonda Primes, MD

## 2022-01-24 NOTE — Assessment & Plan Note (Signed)
S/p repair L 11/29/21 In PT

## 2022-01-24 NOTE — Assessment & Plan Note (Signed)
Pt saw Dr Marjory Lies, had EEG. Brain MRI and cardiology appt is pending. No relapse

## 2022-01-24 NOTE — Assessment & Plan Note (Signed)
Possible S tachy or SVT episodes Appt w/Dr Jens Som pending Try empiric Toprol XL low dose C/o HR 100 at night often; 30 min duration - x 2-3 wks; h/o having it rare in the past

## 2022-01-24 NOTE — Assessment & Plan Note (Signed)
Chronic w/RLS Cont on Zolpidem   Potential benefits of a long term benzodiazepines  use as well as potential risks  and complications were explained to the patient and were aknowledged.

## 2022-01-26 ENCOUNTER — Ambulatory Visit
Admission: RE | Admit: 2022-01-26 | Discharge: 2022-01-26 | Disposition: A | Discharge status: Home / Self Care | Payer: Medicare Other | Source: Ambulatory Visit | Attending: Diagnostic Neuroimaging | Admitting: Diagnostic Neuroimaging

## 2022-01-26 DIAGNOSIS — Q046 Congenital cerebral cysts: Secondary | ICD-10-CM | POA: Diagnosis not present

## 2022-01-26 DIAGNOSIS — R55 Syncope and collapse: Secondary | ICD-10-CM

## 2022-01-26 DIAGNOSIS — Z85820 Personal history of malignant melanoma of skin: Secondary | ICD-10-CM | POA: Diagnosis not present

## 2022-01-26 MED ORDER — GADOBENATE DIMEGLUMINE 529 MG/ML IV SOLN
15.0000 mL | Freq: Once | INTRAVENOUS | Status: AC | PRN
  Administered 2022-01-26: 15 mL via INTRAVENOUS
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
## 2022-01-27 ENCOUNTER — Ambulatory Visit (INDEPENDENT_AMBULATORY_CARE_PROVIDER_SITE_OTHER): Payer: Medicare Other | Admitting: Cardiology

## 2022-01-27 ENCOUNTER — Encounter: Payer: Self-pay | Admitting: Cardiology

## 2022-01-27 ENCOUNTER — Ambulatory Visit (INDEPENDENT_AMBULATORY_CARE_PROVIDER_SITE_OTHER): Payer: Medicare Other

## 2022-01-27 VITALS — BP 127/79 | HR 72 | Ht 63.0 in | Wt 147.2 lb

## 2022-01-27 DIAGNOSIS — I700 Atherosclerosis of aorta: Secondary | ICD-10-CM

## 2022-01-27 DIAGNOSIS — R002 Palpitations: Secondary | ICD-10-CM | POA: Diagnosis not present

## 2022-01-27 NOTE — Progress Notes (Unsigned)
Enrolled for [employer] to mail a ZIO XT long term holter monitor to the patients address on file.  

## 2022-01-27 NOTE — Patient Instructions (Signed)
Medication Instructions:  Your physician recommends that you continue on your current medications as directed. Please refer to the Current Medication list given to you today.  *If you need a refill on your cardiac medications before your next appointment, please call your pharmacy*   Lab Work: None If you have labs (blood work) drawn today and your tests are completely normal, you will receive your results only by: Christine (if you have MyChart) OR A paper copy in the mail If you have any lab test that is abnormal or we need to change your treatment, we will call you to review the results.   Testing/Procedures: Bryn [place]- Long Term Monitor Instructions  Your physician has requested you wear a ZIO patch monitor for 3 days.  This is a single patch monitor. [employer] supplies one patch monitor per enrollment. Additional stickers are not available. Please do not apply patch if you will be having a Nuclear Stress Test,  Echocardiogram, Cardiac CT, MRI, or Chest Xray during the period you would be wearing the  monitor. The patch cannot be worn during these tests. You cannot remove and re-apply the  ZIO XT patch monitor.  Your ZIO patch monitor will be mailed 3 day USPS to your address on file. It may take 3-5 days  to receive your monitor after you have been enrolled.  Once you have received your monitor, please review the enclosed instructions. Your monitor  has already been registered assigning a specific monitor serial # to you.  Billing and Patient Assistance Program Information  We have supplied [employer] with any of your insurance information on file for billing purposes. [employer] offers a sliding scale Patient Assistance Program for patients that do not have  insurance, or whose insurance does not completely cover the cost of the ZIO monitor.  You must apply for the Patient Assistance Program to qualify for this discounted rate.  To apply, please call [employer] at [phone], select  option 4, select option 2, ask to apply for  Patient Assistance Program. Theodore Demark will ask your household income, and how many people  are in your household. They will quote your out-of-pocket cost based on that information.  [employer] will also be able to set up a 71-month interest-free payment plan if needed.  Applying the monitor   Shave hair from upper left chest.  Hold abrader disc by orange tab. Rub abrader in 40 strokes over the upper left chest as  indicated in your monitor instructions.  Clean area with 4 enclosed alcohol pads. Let dry.  Apply patch as indicated in monitor instructions. Patch will be placed under collarbone on left  side of chest with arrow pointing upward.  Rub patch adhesive wings for 2 minutes. Remove white label marked "1". Remove the white  label marked "2". Rub patch adhesive wings for 2 additional minutes.  While looking in a mirror, press and release button in center of patch. A small green light will  flash 3-4 times. This will be your only indicator that the monitor has been turned on.  Do not shower for the first 24 hours. You may shower after the first 24 hours.  Press the button if you feel a symptom. You will hear a small click. Record Date, Time and  Symptom in the Patient Logbook.  When you are ready to remove the patch, follow instructions on the last 2 pages of Patient  Logbook. Stick patch monitor onto the last page of Patient Logbook.  Place Patient Logbook in  the blue and white box. Use locking tab on box and tape box closed  securely. The blue and white box has prepaid postage on it. Please place it in the mailbox as  soon as possible. Your physician should have your test results approximately 7 days after the  monitor has been mailed back to [facility].  Call [place] at [phone] if you have questions regarding  your ZIO XT patch monitor. Call them immediately if you see an orange light blinking on your  monitor.   If your monitor falls off in less than 4 days, contact our Monitor department at [phone].  If your monitor becomes loose or falls off after 4 days call [employer] at [phone] for  suggestions on securing your monitor    Follow-Up: At [facility], you and your health needs are our priority.  As part of our continuing mission to provide you with exceptional heart care, we have created designated Provider Care Teams.  These Care Teams include your primary Cardiologist (physician) and Advanced Practice Providers (APPs -  Physician Assistants and Nurse Practitioners) who all work together to provide you with the care you need, when you need it.  We recommend signing up for the patient portal called "MyChart".  Sign up information is provided on this After Visit Summary.  MyChart is used to connect with patients for Virtual Visits (Telemedicine).  Patients are able to view lab/test results, encounter notes, upcoming appointments, etc.  Non-urgent messages can be sent to your provider as well.   To learn more about what you can do with MyChart, go to [email or web address].    Your next appointment:   As needed  The format for your next appointment:   In Person  Provider:   Minus Breeding, MD    Other Instructions   Important Information About Sugar

## 2022-01-28 ENCOUNTER — Encounter: Payer: Self-pay | Admitting: Physical Therapy

## 2022-01-28 ENCOUNTER — Ambulatory Visit: Payer: Medicare Other | Admitting: Physical Therapy

## 2022-01-28 DIAGNOSIS — M25512 Pain in left shoulder: Secondary | ICD-10-CM | POA: Diagnosis not present

## 2022-01-28 DIAGNOSIS — R600 Localized edema: Secondary | ICD-10-CM | POA: Diagnosis not present

## 2022-01-28 DIAGNOSIS — M25612 Stiffness of left shoulder, not elsewhere classified: Secondary | ICD-10-CM | POA: Diagnosis not present

## 2022-01-28 NOTE — Therapy (Addendum)
OUTPATIENT PHYSICAL THERAPY TREATMENT NOTE   Patient Name: Elizabeth Gray MRN: [id number] DOB:[birth year], 81 y.o., female Today's Date: 01/28/2022   REFERRING PROVIDER: Cline Crock, PA-C   PT End of Session - 01/28/22 2952     Visit Number 14    Number of Visits 18    Date for PT Re-Evaluation 01/27/22    Authorization Type FOTO AT LEAST EVERY 5TH VISIT.  PROGRESS NOTE AT 10TH VISIT.  KX MODIFIER AFTER 15 VISITS.    PT Start Time [phone]    PT Stop Time 1030    PT Time Calculation (min) 40 min    Activity Tolerance Patient tolerated treatment well    Behavior During Therapy WFL for tasks assessed/performed               Past Medical History:  Diagnosis Date   Allergy    Bursitis    Cancer ([place]) 03/22/2017   Melanoma in situ, lentigo maligna type   Deviated septum    External hemorrhoids without mention of complication 2440   Colonoscopy    Fatigue    GERD (gastroesophageal reflux disease)    Headache(784.0)    migraines   Heat rash    under the breasts.Marland Kitchenappeared on monday.Marland KitchenMarland KitchenBurning & itching, uses cortisone   Hypothyroidism    Iron deficiency anemia, unspecified    Lower esophageal ring 2008   EGD   Nonorganic sleep disorder, unspecified    Osteoarthritis    Osteoporosis    Pancreatitis    Personal history of colonic polyps    Polio 1948   Pulmonary sarcoidosis ([place])    Sleep apnea    [facility]   Vitamin B12 deficiency    Vitamin D deficiency    Past Surgical History:  Procedure Laterality Date   [place]  2002   rt   CHOLECYSTECTOMY  2000   COLONOSCOPY  12/19/2006   Multiple diminutive polyps destroyed-removed (hyperpastic), internal hemorrhoids   ESOPHAGOGASTRODUODENOSCOPY  12/19/2006   lower esophageal ring dilated   HAND SURGERY  1997   left   KNEE ARTHROSCOPY Bilateral    multiple 2 on lft 1 on rt   [place]   Melanoma Removal  Right 2018    right face   ROTATOR CUFF REPAIR Left    Nov 29, 2021   TOE SURGERY Left    left foot next to little toe  joint rem   TOTAL KNEE ARTHROPLASTY Right 05/09/2014   Procedure: RIGHT TOTAL KNEE ARTHROPLASTY;  Surgeon: [name], MD;  Location: [place];  Service: Orthopedics;  Laterality: Right;   TOTAL KNEE ARTHROPLASTY Left 08/06/2021   Procedure: TOTAL KNEE ARTHROPLASTY;  Surgeon: [name], MD;  Location: [facility];  Service: Orthopedics;  Laterality: Left;   UPPER GASTROINTESTINAL ENDOSCOPY     WISDOM TOOTH EXTRACTION     Patient Active Problem List   Diagnosis Date Noted   Palpitations 01/24/2022   Abdominal hernia 11/24/2021   Anemia 11/24/2021   Asthma 11/24/2021   Decreased estrogen level 11/24/2021   Syncope 11/24/2021   Sphenoid sinusitis 11/24/2021   Rotator cuff tear 11/22/2021   Full thickness rotator cuff tear 09/30/2021   Insomnia 09/23/2020   Dyslipidemia 09/23/2020   RLS (restless legs syndrome) 07/20/2020   Drug side effects, initial encounter 07/20/2020   Coronary atherosclerosis 03/18/2019   Post-traumatic headache, not intractable 12/04/2018   Grief 09/12/2018   Atrophic vaginitis  09/06/2017   Burning sensation of mouth 09/06/2017   Osteopenia 09/05/2016   Upper respiratory infection 09/05/2016   Chest pain 03/04/2016   Headache(784.0) 07/19/2013   Tinnitus 07/19/2013   Dry skin dermatitis 08/31/2012   Left hip pain 08/31/2012   Cerumen impaction 08/31/2012   Well adult exam 05/25/2012   Upper abdominal pain-chronic 04/02/2011   Thumb pain 03/11/2011   Donor, blood 03/11/2011   Shoulder pain 03/11/2011   Bilateral primary osteoarthritis of knee 09/10/2010   VERTIGO 03/05/2010   OSA on CPAP 07/22/2008   B12 deficiency 08/16/2007   Vitamin D deficiency 08/16/2007   Nonorganic sleep disorder 05/10/2007   SARCOIDOSIS, PULMONARY 05/09/2007   Hypothyroidism 05/09/2007   ALLERGIC RHINITIS 05/09/2007   GERD 05/09/2007   Personal history of colon  polyps 05/09/2007    REFERRING DIAG: Acute pain of left shoulder, stiffness of left shoulder, not elsewhere classified, localized edema  THERAPY DIAG:  Acute pain of left shoulder  Stiffness of left shoulder, not elsewhere classified  Localized edema  Rationale for Evaluation and Treatment Rehabilitation  PERTINENT HISTORY: Bilateral TKA's, CTR, lumbar surgery.  PRECAUTIONS: Rotator cuff repair  SUBJECTIVE: Reported lifting a bag of apples and bananas recently and has pain at proximal shoulder for a while.   PAIN:  Are you having pain? Yes: NPRS scale: 1/10 Pain location: L anterior shoulder Pain description: sore, tender Aggravating factors: certain movements Relieving factors: none     TODAY'S TREATMENT:                                     EXERCISE LOG  Exercise Repetitions and Resistance Comments  Pulleys X5 min   Wall slides Into flexion x20 reps   L shoulder row  Yellow theraband x20 reps   L shoulder extension Yellow theraband x20 reps   L shoulder ER Yellow theraband  x20 reps More discomfort  L shoulder IR  Yellow theraband x20 reps     Completed in slow and controlled reps to not aggravate tissue   Blank cell = exercise not performed today    Modalities  Vaso: Shoulder, low , 10 mins, Pain              PT Long Term Goals - 01/28/22 3419       PT LONG TERM GOAL #1   Title independent with a HEP.    Time 4    Period Weeks    Status Achieved      PT LONG TERM GOAL #2   Title Active left shoulder flexion to 145 degrees so the patient can easily reach overhead.    Time 4    Period Weeks    Status On-going      PT LONG TERM GOAL #3   Title Active ER to 70 degrees+ to allow for easily donning/doffing of apparel.    Time 4    Period Weeks    Status Achieved      PT LONG TERM GOAL #4   Title Increase ROM so patient is able to reach behind back to L3.    Time 4    Period Weeks    Status On-going      PT LONG TERM GOAL #5   Title  Increase left shoulder strength to a solid 4+/5 to increase stability for performance of functional activities.    Time 4    Period Weeks    Status  On-going      PT LONG TERM GOAL #6   Title Perform ADL's with pain not > 2-3/10.    Time 6    Period Weeks    Status Achieved              Plan - 01/28/22 0953     Clinical Impression Statement Patient presented in clinic with reports of recently lifting produce. Had lifted cantaloupe and honeydew but they were individually bagged. Patient indicated the flare up started when she lifted a bag with seven apples and seven bananas bagged together. Patient was assessed by Jacqulynn Cadet, DPT and after assessment he found only possible flare up of proximal bicep. Light strengthening and ROM completed as to not further inflam. Only reports of discomfort by end of pulley session and L shoulder ER. Normal vasopnuematic response noted following removal of the modality.   Personal Factors and Comorbidities Comorbidity 1;Comorbidity 2;Other    Comorbidities Bilateral TKA's, CTR, lumbar surgery.    Examination-Activity Limitations Other;Reach Overhead;Bathing;Dressing    Examination-Participation Restrictions Other;Meal Prep    Stability/Clinical Decision Making Stable/Uncomplicated    Rehab Potential Excellent    PT Frequency 3x / week    PT Duration 4 weeks    PT Treatment/Interventions ADLs/Self Care Home Management;Cryotherapy;Electrical Stimulation;Ultrasound;Therapeutic activities;Therapeutic exercise;Manual techniques;Patient/family education;Passive range of motion;Vasopneumatic Device    PT Next Visit Plan Contine AROM/strengthening    Consulted and Agree with Plan of Care Patient              Standley Brooking, PTA 01/28/22 10:52 AM

## 2022-01-30 DIAGNOSIS — R002 Palpitations: Secondary | ICD-10-CM | POA: Diagnosis not present

## 2022-02-03 NOTE — Addendum Note (Signed)
Addended by: Deanna Artis A on: 02/03/2022 07:53 AM   Modules accepted: Orders

## 2022-02-08 DIAGNOSIS — R002 Palpitations: Secondary | ICD-10-CM | POA: Diagnosis not present

## 2022-02-09 ENCOUNTER — Telehealth: Payer: Self-pay | Admitting: Cardiology

## 2022-02-09 DIAGNOSIS — M5431 Sciatica, right side: Secondary | ICD-10-CM | POA: Diagnosis not present

## 2022-02-09 DIAGNOSIS — M9904 Segmental and somatic dysfunction of sacral region: Secondary | ICD-10-CM | POA: Diagnosis not present

## 2022-02-09 DIAGNOSIS — M9903 Segmental and somatic dysfunction of lumbar region: Secondary | ICD-10-CM | POA: Diagnosis not present

## 2022-02-09 DIAGNOSIS — M9905 Segmental and somatic dysfunction of pelvic region: Secondary | ICD-10-CM | POA: Diagnosis not present

## 2022-02-09 NOTE — Telephone Encounter (Signed)
Spoke with patient of Dr. Percival Spanish and relayed results. She takes metoprolol succinate with dinner. Palpitations happen at night. She feels she may need to adjust timing of med. Plans to adjust and will call next week with update if this does not resolve nighttime symptoms.   HR runs in 70s -- up to 100 with SVT BP typically 120/70

## 2022-02-09 NOTE — Telephone Encounter (Signed)
Pt is returning call for results. Requesting a return call.

## 2022-02-09 NOTE — Telephone Encounter (Signed)
Chriss Driver, RN  02/09/2022  2:08 PM EDT     Attempted to call patient, left message for patient to call back to office.    Minus Breeding, MD  02/09/2022  8:54 AM EDT     She does have frequent SVT.  She was to start taking the low dose beta-blocker to see how this helps.  If she continues to have palpitations she should increase that dose.  I believe she is taking half of the 25 mg.  She should let us know how she does with this initial dose.

## 2022-02-11 ENCOUNTER — Ambulatory Visit: Payer: Medicare Other | Attending: Physician Assistant | Admitting: Physical Therapy

## 2022-02-11 ENCOUNTER — Encounter: Payer: Self-pay | Admitting: Physical Therapy

## 2022-02-11 DIAGNOSIS — M25512 Pain in left shoulder: Secondary | ICD-10-CM | POA: Diagnosis not present

## 2022-02-11 DIAGNOSIS — M25612 Stiffness of left shoulder, not elsewhere classified: Secondary | ICD-10-CM | POA: Diagnosis not present

## 2022-02-11 DIAGNOSIS — R600 Localized edema: Secondary | ICD-10-CM | POA: Diagnosis not present

## 2022-02-11 NOTE — Therapy (Addendum)
OUTPATIENT PHYSICAL THERAPY TREATMENT NOTE   Patient Name: Elizabeth Gray MRN: [id number] DOB:Sep 21, 1940, 81 y.o., female Today's Date: 02/11/2022   REFERRING PROVIDER: Cline Crock, PA-C   PT End of Session - 01/28/22 0952     Visit Number 15   Number of Visits 18    Date for PT Re-Evaluation 01/27/22    Authorization Type FOTO AT LEAST EVERY 5TH VISIT.  PROGRESS NOTE AT 10TH VISIT.  KX MODIFIER AFTER 15 VISITS.    PT Start Time [phone]   PT Stop Time 1029   PT Time Calculation (min) 40 min    Activity Tolerance Patient tolerated treatment well    Behavior During Therapy WFL for tasks assessed/performed               Past Medical History:  Diagnosis Date   Allergy    Bursitis    Cancer ([place]) 03/22/2017   Melanoma in situ, lentigo maligna type   Deviated septum    External hemorrhoids without mention of complication 5027   Colonoscopy    Fatigue    GERD (gastroesophageal reflux disease)    Headache(784.0)    migraines   Heat rash    under the breasts.Marland Kitchenappeared on monday.Marland KitchenMarland KitchenBurning & itching, uses cortisone   Hypothyroidism    Iron deficiency anemia, unspecified    Lower esophageal ring 2008   EGD   Nonorganic sleep disorder, unspecified    Osteoarthritis    Osteoporosis    Pancreatitis    Personal history of colonic polyps    Polio 1948   Pulmonary sarcoidosis ([place])    Sleep apnea    [facility]   Vitamin B12 deficiency    Vitamin D deficiency    Past Surgical History:  Procedure Laterality Date   [place]  2002   rt   CHOLECYSTECTOMY  2000   COLONOSCOPY  12/19/2006   Multiple diminutive polyps destroyed-removed (hyperpastic), internal hemorrhoids   ESOPHAGOGASTRODUODENOSCOPY  12/19/2006   lower esophageal ring dilated   HAND SURGERY  1997   left   KNEE ARTHROSCOPY Bilateral    multiple 2 on lft 1 on rt   [place]   Melanoma Removal  Right 2018   right  face   ROTATOR CUFF REPAIR Left    Nov 29, 2021   TOE SURGERY Left    left foot next to little toe  joint rem   TOTAL KNEE ARTHROPLASTY Right 05/09/2014   Procedure: RIGHT TOTAL KNEE ARTHROPLASTY;  Surgeon: [name], MD;  Location: [place];  Service: Orthopedics;  Laterality: Right;   TOTAL KNEE ARTHROPLASTY Left 08/06/2021   Procedure: TOTAL KNEE ARTHROPLASTY;  Surgeon: [name], MD;  [facility];  Service: Orthopedics;  Laterality: Left;   UPPER GASTROINTESTINAL ENDOSCOPY     WISDOM TOOTH EXTRACTION     Patient Active Problem List   Diagnosis Date Noted   Palpitations 01/24/2022   Abdominal hernia 11/24/2021   Anemia 11/24/2021   Asthma 11/24/2021   Decreased estrogen level 11/24/2021   Syncope 11/24/2021   Sphenoid sinusitis 11/24/2021   Rotator cuff tear 11/22/2021   Full thickness rotator cuff tear 09/30/2021   Insomnia 09/23/2020   Dyslipidemia 09/23/2020   RLS (restless legs syndrome) 07/20/2020   Drug side effects, initial encounter 07/20/2020   Coronary atherosclerosis 03/18/2019   Post-traumatic headache, not intractable 12/04/2018   Grief 09/12/2018   Atrophic vaginitis 09/06/2017  Burning sensation of mouth 09/06/2017   Osteopenia 09/05/2016   Upper respiratory infection 09/05/2016   Chest pain 03/04/2016   Headache(784.0) 07/19/2013   Tinnitus 07/19/2013   Dry skin dermatitis 08/31/2012   Left hip pain 08/31/2012   Cerumen impaction 08/31/2012   Well adult exam 05/25/2012   Upper abdominal pain-chronic 04/02/2011   Thumb pain 03/11/2011   Donor, blood 03/11/2011   Shoulder pain 03/11/2011   Bilateral primary osteoarthritis of knee 09/10/2010   VERTIGO 03/05/2010   OSA on CPAP 07/22/2008   B12 deficiency 08/16/2007   Vitamin D deficiency 08/16/2007   Nonorganic sleep disorder 05/10/2007   SARCOIDOSIS, PULMONARY 05/09/2007   Hypothyroidism 05/09/2007   ALLERGIC RHINITIS 05/09/2007   GERD 05/09/2007   Personal history of colon polyps  05/09/2007    REFERRING DIAG: Acute pain of left shoulder, stiffness of left shoulder, not elsewhere classified, localized edema  THERAPY DIAG:  Acute pain of left shoulder  Stiffness of left shoulder, not elsewhere classified  Localized edema  Rationale for Evaluation and Treatment Rehabilitation  PERTINENT HISTORY: Bilateral TKA's, CTR, lumbar surgery.  PRECAUTIONS: Rotator cuff repair  SUBJECTIVE: No new complaints regarding her shoulder other than tripping on her carport this morning and scuffing up her toe. Reports being able to do anything she wants now.  PAIN:  PAIN:  Are you having pain? No  TODAY'S TREATMENT:                                     EXERCISE LOG  Exercise Repetitions and Resistance Comments  Pulleys X5 min   Wall clock 2# x10 reps   L shoulder row  Green theraband x20 reps   L shoulder extension Green theraband x20 reps   L shoulder ER Green theraband  x20 reps   L shoulder IR  Green theraband x20 reps   Horizontal abduction Green theraband x15 reps   L shoulder flexion 2# x20 reps   L shoulder abduction 2# x15 reps        Blank cell = exercise not performed today    Modalities  Date:  Vaso: Shoulder, 80-150 hz, 10 mins, post workout cool down   AROM/ MMT Right date  Left 02/11/2022 date  Shoulder flexion  160     4+/5  Shoulder extension    Shoulder abduction  4+/5  Shoulder adduction    Shoulder internal rotation  4+/5  Shoulder external rotation  4+/5  Elbow flexion    Elbow extension    Wrist flexion    Wrist extension    Wrist ulnar deviation    Wrist radial deviation    Wrist pronation    Wrist supination    (Blank rows = not tested)   FOTO score: 15th visit 17% limitation at discharge   PT Long Term Goals - 01/28/22 1505       PT LONG TERM GOAL #1   Title independent with a HEP.    Time 4    Period Weeks    Status Achieved      PT LONG TERM GOAL #2   Title Active left shoulder flexion to 145 degrees so the  patient can easily reach overhead.    Time 4    Period Weeks    Status Achieved     PT LONG TERM GOAL #3   Title Active ER to 70 degrees+ to allow for easily donning/doffing of apparel.  Time 4    Period Weeks    Status Achieved      PT LONG TERM GOAL #4   Title Increase ROM so patient is able to reach behind back to L3.    Time 4    Period Weeks    Status Achieved (L1)     PT LONG TERM GOAL #5   Title Increase left shoulder strength to a solid 4+/5 to increase stability for performance of functional activities.    Time 4    Period Weeks    Status Achieved     PT LONG TERM GOAL #6   Title Perform ADL's with pain not > 2-3/10.    Time 6    Period Weeks    Status Achieved              Plan - 01/28/22 0953     Clinical Impression Statement Patient presented in clinic with only minimal functional limitation in horizontal adduction to wash R shoulder better and still has some difficulty with picking up any considerable weighted object. Patient denies any other reaching or functional activity limitations. Patient progressed through strengthening of L shoulder without complaint of pain. Patient has been able to achieve all goals set at evaluation. Normal vasopneumatic response noted following removal of the modality for post workout cool down.   Personal Factors and Comorbidities Comorbidity 1;Comorbidity 2;Other    Comorbidities Bilateral TKA's, CTR, lumbar surgery.    Examination-Activity Limitations Other;Reach Overhead;Bathing;Dressing    Examination-Participation Restrictions Other;Meal Prep    Stability/Clinical Decision Making Stable/Uncomplicated    Rehab Potential Excellent    PT Frequency 3x / week    PT Duration 4 weeks    PT Treatment/Interventions ADLs/Self Care Home Management;Cryotherapy;Electrical Stimulation;Ultrasound;Therapeutic activities;Therapeutic exercise;Manual techniques;Patient/family education;Passive range of motion;Vasopneumatic Device    PT  Next Visit Plan Contine AROM/strengthening    Consulted and Agree with Plan of Care Patient              Standley Brooking, PTA 02/11/22 11:03 AM

## 2022-02-16 DIAGNOSIS — M9905 Segmental and somatic dysfunction of pelvic region: Secondary | ICD-10-CM | POA: Diagnosis not present

## 2022-02-16 DIAGNOSIS — M9904 Segmental and somatic dysfunction of sacral region: Secondary | ICD-10-CM | POA: Diagnosis not present

## 2022-02-16 DIAGNOSIS — M5431 Sciatica, right side: Secondary | ICD-10-CM | POA: Diagnosis not present

## 2022-02-16 DIAGNOSIS — M9903 Segmental and somatic dysfunction of lumbar region: Secondary | ICD-10-CM | POA: Diagnosis not present

## 2022-02-18 ENCOUNTER — Telehealth: Payer: Self-pay | Admitting: Cardiology

## 2022-02-18 NOTE — Telephone Encounter (Signed)
Pt c/o medication issue:  1. Name of Medication: metoprolol succinate (TOPROL-XL) 25 MG 24 hr tablet  2. How are you currently taking this medication (dosage and times per day)?  Take 0.5 tablets (12.5 mg total) by mouth at bedtime.Patient not taking: Reported on 01/27/2022  3. Are you having a reaction (difficulty breathing--STAT)? no  4. What is your medication issue? Calling back regarding the medication. She wanted to let nurse know what is work for her. Please advise

## 2022-02-18 NOTE — Telephone Encounter (Signed)
Returned the call to the patient. She stated that she has started taking Metoprolol Succinate 12.5 mg in the morning and 12.5 mg at bedtime and this has been controlling her tachycardia and palpitations. She would like to know if she could have a new prescription since this has been working for her to control the tachycardia and palpitations.

## 2022-02-21 MED ORDER — METOPROLOL SUCCINATE ER 25 MG PO TB24: 12.5000 mg | ORAL_TABLET | Freq: Two times a day (BID) | ORAL | 3 refills | Status: DC

## 2022-02-21 NOTE — Telephone Encounter (Signed)
Metoprolol Succinate 12.5 mg bid has been sent in for the patient, per Dr. Percival Spanish.  Minus Breeding, MD  You 5 hours ago (9:02 AM)    OK to prescribe

## 2022-02-23 ENCOUNTER — Ambulatory Visit (INDEPENDENT_AMBULATORY_CARE_PROVIDER_SITE_OTHER): Payer: Medicare Other

## 2022-02-23 DIAGNOSIS — Z0000 Encounter for general adult medical examination without abnormal findings: Secondary | ICD-10-CM

## 2022-02-23 NOTE — Progress Notes (Signed)
I connected with Elizabeth Gray today by telephone and verified that I am speaking with the correct person using two identifiers. Location patient: home Location provider: work Persons participating in the virtual visit: patient, provider.   I discussed the limitations, risks, security and privacy concerns of performing an evaluation and management service by telephone and the availability of in person appointments. I also discussed with the patient that there may be a patient responsible charge related to this service. The patient expressed understanding and verbally consented to this telephonic visit.    Interactive audio and video telecommunications were attempted between this provider and patient, however failed, due to patient having technical difficulties OR patient did not have access to video capability.  We continued and completed visit with audio only.  Some vital signs may be absent or patient reported.   Time Spent with patient on telephone encounter: 30 minutes  Subjective:   Elizabeth Gray is a 81 y.o. female who presents for Medicare Annual (Subsequent) preventive examination.  Review of Systems     Cardiac Risk Factors include: advanced age (>29mn, >>96women);hypertension;dyslipidemia     Objective:    There were no vitals filed for this visit. There is no height or weight on file to calculate BMI.     02/23/2022    2:40 PM 11/23/2021    9:31 AM 08/09/2021   12:33 PM 07/21/2021    9:24 AM 12/31/2019    1:28 PM 09/12/2018    9:14 AM 11/18/2016    9:26 AM  Advanced Directives  Does Patient Have a Medical Advance Directive? Yes Yes Yes Yes Yes Yes Yes  Type of AParamed[place]of [place]ake [place] will [place] will  [place] will  [place]ristocrat [place]anche[place]Living will [place]Living will  Does patient want to make changes to medical advance directive? No - Patient declined         Copy of [place] Chart? No - copy requested No - copy requested    No - copy requested No - copy requested    Current Medications (verified) Outpatient Encounter Medications as of 02/23/2022  Medication Sig   Acetaminophen (TYLENOL PO) Take by mouth.   Alum Hydroxide-Mag Carbonate (GAVISCON PO) Take 1 tablet by mouth 3 (three) times daily after meals.   ASPIRIN 81 PO Take by mouth.   Cholecalciferol (VITAMIN D PO) Take 1,000 Units by mouth every other day.    cyanocobalamin (,VITAMIN B-12,) 1000 MCG/ML injection INJECT 1 ML INTRAMUSCULARLY EVERY14 DAYS   Flaxseed, Linseed, (FLAXSEED OIL) 1000 MG CAPS Take 1,000 mg by mouth daily.   Glucosamine-Chondroitin (MOVE FREE PO) Take 1 tablet by mouth in the morning and at bedtime.   levothyroxine (SYNTHROID) 100 MCG tablet Take 1 tablet (100 mcg total) by mouth daily.   magnesium oxide (MAG-OX) 400 MG tablet Take 400 mg by mouth at bedtime.   metoprolol succinate (TOPROL-XL) 25 MG 24 hr tablet Take 0.5 tablets (12.5 mg total) by mouth 2 (two) times daily.   Multiple Vitamins-Minerals (ICAPS AREDS FORMULA PO) Take 1 capsule by mouth in the morning and at bedtime.   omeprazole-sodium bicarbonate (ZEGERID) 40-1100 MG capsule TAKE 1 CAPSULE BY MOUTH ONCE DAILY BEFORE BREAKFAST** PATIENT NEEDS OFFICE VISIT   Polyethyl Glycol-Propyl Glycol 0.4-0.3 % SOLN Place 2-3 drops into both eyes 2 (two) times daily as needed (dry eyes).   pramipexole (MIRAPEX) 1 MG tablet Take 0.5-1 tablets (0.5-1 mg total)  by mouth at bedtime.   pravastatin (PRAVACHOL) 20 MG tablet Take 1 tablet (20 mg total) by mouth daily.   SYRINGE-NEEDLE, DISP, 3 ML (B-D 3CC LUER-LOK SYR 25GX1") 25G X 1" 3 ML MISC USE AS DIRECTED   traMADol (ULTRAM) 50 MG tablet Take 1-2 tablets (50-100 mg total) by mouth every 6 (six) hours as needed for moderate pain or severe pain.   zolpidem (AMBIEN) 10 MG tablet Take 1 tablet (10 mg total) by mouth at bedtime as needed. for sleep    No facility-administered encounter medications on file as of 02/23/2022.    Allergies (verified) Dexilant [dexlansoprazole], Dayquil severe + vapocool [phenylephrine-dm-gg-apap], Gabapentin, Metoclopramide hcl, Oxymetazoline hcl, Restasis [cyclosporine], Sucralfate, Dm-doxylamine-acetaminophen, and Vicks dayquil hbp cold & flu [acetaminophen-dm]   History: Past Medical History:  Diagnosis Date   Allergy    Bursitis    Cancer ([place]) 03/22/2017   Melanoma in situ, lentigo maligna type   Deviated septum    External hemorrhoids without mention of complication 7026   Colonoscopy    Fatigue    GERD (gastroesophageal reflux disease)    Headache(784.0)    migraines   Heat rash    under the breasts.Elizabeth Kitchenappeared on monday.Elizabeth KitchenMarland KitchenBurning & itching, uses cortisone   Hypothyroidism    Iron deficiency anemia, unspecified    Lower esophageal ring 2008   EGD   Nonorganic sleep disorder, unspecified    Osteoarthritis    Osteoporosis    Pancreatitis    Personal history of colonic polyps    Polio 1948   Pulmonary sarcoidosis ([place])    Sleep apnea    [facility] 09 [facility]   Vitamin B12 deficiency    Vitamin D deficiency    Past Surgical History:  Procedure Laterality Date   [place]  2002   rt   CHOLECYSTECTOMY  2000   COLONOSCOPY  12/19/2006   Multiple diminutive polyps destroyed-removed (hyperpastic), internal hemorrhoids   ESOPHAGOGASTRODUODENOSCOPY  12/19/2006   lower esophageal ring dilated   HAND SURGERY  1997   left   KNEE ARTHROSCOPY Bilateral    multiple 2 on lft 1 on rt   [place]   Melanoma Removal  Right 2018   right face   ROTATOR CUFF REPAIR Left    Nov 29, 2021   TOE SURGERY Left    left foot next to little toe  joint rem   TOTAL KNEE ARTHROPLASTY Right 05/09/2014   Procedure: RIGHT TOTAL KNEE ARTHROPLASTY;  Surgeon: [name], MD;  Location: [place];  Service: Orthopedics;  Laterality: Right;    TOTAL KNEE ARTHROPLASTY Left 08/06/2021   Procedure: TOTAL KNEE ARTHROPLASTY;  Surgeon: [name], MD;  Location: [facility];  Service: Orthopedics;  Laterality: Left;   UPPER GASTROINTESTINAL ENDOSCOPY     WISDOM TOOTH EXTRACTION     Family History  Problem Relation Age of Onset   Brain cancer Father        brain tumor   Migraines Mother    Breast cancer Paternal Aunt    Colon cancer Maternal Aunt    Esophageal cancer Maternal Grandfather    Rectal cancer Neg Hx    Stomach cancer Neg Hx    Colon polyps Neg Hx    Social History   Socioeconomic History   Marital status: Widowed    Spouse name: [name]   Number of children: 0   Years of education: college   Highest education level:  Not on file  Occupational History   Occupation: Statistician    Comment: works three days per week    Occupation: LEGAL ASSISTANT 3d/wk    Employer: [employer]  Tobacco Use   Smoking status: Former    Packs/day: 1.00    Years: 17.00    Total pack years: 17.00    Types: Cigarettes    Quit date: 08/01/1990    Years since quitting: 31.5   Smokeless tobacco: Never  Vaping Use   Vaping Use: Never used  Substance and Sexual Activity   Alcohol use: No    Alcohol/week: 0.0 standard drinks of alcohol   Drug use: No   Sexual activity: Never  Other Topics Concern   Not on file  Social History Narrative   Patient lives alone; feels safe in her home.  Patient is a widow.   Caffeine Use: none   She just retired on August 10, 2017- worked in a [employer] firm for 59 years.   Right handed   Social Determinants of Health   Financial Resource Strain: Low Risk  (02/23/2022)   Overall Financial Resource Strain (CARDIA)    Difficulty of Paying Living Expenses: Not hard at all  Food Insecurity: No Food Insecurity (02/23/2022)   Hunger Vital Sign    Worried About Running Out of Food in the Last Year: Never true    Ran Out of Food in the Last Year: Never true  Transportation Needs: Unknown (02/23/2022)    PRAPARE - Hydrologist (Medical): No    Lack of Transportation (Non-Medical): Not on file  Physical Activity: Sufficiently Active (02/23/2022)   Exercise Vital Sign    Days of Exercise per Week: 7 days    Minutes of Exercise per Session: 30 min  Stress: No Stress Concern Present (02/23/2022)   [place]    Feeling of Stress : Not at all  Social Connections: Moderately Integrated (02/23/2022)   Social Connection and Isolation Panel [NHANES]    Frequency of Communication with Friends and Family: More than three times a week    Frequency of Social Gatherings with Friends and Family: More than three times a week    Attends Religious Services: More than 4 times per year    Active Member of [employer] or Organizations: Yes    Attends Archivist Meetings: More than 4 times per year    Marital Status: Widowed    Tobacco Counseling Counseling given: Not Answered   Clinical Intake:  Pre-visit preparation completed: Yes  Pain : No/denies pain     BMI - recorded: 26.08 Nutritional Status: BMI 25 -29 Overweight Nutritional Risks: None Diabetes: No  How often do you need to have someone help you when you read instructions, pamphlets, or other written materials from your doctor or pharmacy?: 1 - Never What is the last grade level you completed in school?: Certicate from [facility]  Diabetic? no  Interpreter Needed?: No  Information entered by :: Lisette Abu, LPN.   Activities of Daily Living    02/23/2022    2:26 PM 07/21/2021    9:26 AM  In your present state of health, do you have any difficulty performing the following activities:  Hearing? 1   Vision? 0   Difficulty concentrating or making decisions? 0   Walking or climbing stairs? 0   Dressing or bathing? 0   Doing errands, shopping? 0 0  Preparing Food and eating ?  N   Using the Toilet? N   In the past six months, have  you accidently leaked urine? Y   Do you have problems with loss of bowel control? N   Managing your Medications? N   Managing your Finances? N   Housekeeping or managing your Housekeeping? N     Patient Care Team: Plotnikov, Evie Lacks, MD as PCP - General Joya Gaskins Burnett Harry, MD (Pulmonary Disease) [name], MD as Consulting Physician (Orthopedic Surgery) Penni Bombard, MD as Consulting Physician (Neurology) Alanda Slim, Neena Rhymes, MD as Consulting Physician (Ophthalmology)  Indicate any recent Medical Services you may have received from other than [facility] providers in the past year (date may be approximate).     Assessment:   This is a routine wellness examination for Elizabeth Gray.  Hearing/Vision screen Hearing Screening - Comments:: Patient has hearing difficulty and wears hearing aids. Vision Screening - Comments:: Patient does wear corrective lenses/contacts.  Eye exam done by: Julian Reil, MD.   Dietary issues and exercise activities discussed: Current Exercise Habits: Home exercise routine, Type of exercise: walking;Other - see comments (stair climbing), Time (Minutes): 30, Frequency (Times/Week): 7, Weekly Exercise (Minutes/Week): 210, Intensity: Mild, Exercise limited by: orthopedic condition(s)   Goals Addressed             This Visit's Progress    My goal is to maintain my health by staying independent, active and eating healthy.        Depression Screen    02/23/2022    2:41 PM 01/24/2022    8:04 AM 05/24/2021    8:14 AM 03/23/2020    7:55 AM 09/12/2018    9:14 AM 09/12/2018    8:24 AM 09/06/2017    8:57 AM  PHQ 2/9 Scores  PHQ - 2 Score 0 0 0 0 1 0 0  PHQ- 9 Score 0 0 4        Fall Risk    02/23/2022    2:23 PM 01/24/2022    8:04 AM 05/24/2021    8:16 AM 05/24/2021    8:15 AM 03/23/2020    7:54 AM  Fall Risk   Falls in the past year? 0 0 0  0  Number falls in past yr: 0 0 0  0  Injury with Fall? 0 0 0  0  Risk for fall due to : No Fall Risks No  Fall Risks No Fall Risks No Fall Risks No Fall Risks  Follow up Falls evaluation completed Falls evaluation completed   Falls evaluation completed    FALL RISK PREVENTION PERTAINING TO THE HOME:  Any stairs in or around the home? Yes  If so, are there any without handrails? No  Home free of loose throw rugs in walkways, pet beds, electrical cords, etc? Yes  Adequate lighting in your home to reduce risk of falls? Yes   ASSISTIVE DEVICES UTILIZED TO PREVENT FALLS:  Life alert? No  Use of a cane, walker or w/c? No  Grab bars in the bathroom? Yes  Shower chair or bench in shower? Yes  Elevated toilet seat or a handicapped toilet? No   TIMED UP AND GO:  Was the test performed? No .  Length of time to ambulate 10 feet: n/a sec.   Appearance of gait: Gait not evaluated during this visit.  Cognitive Function:        02/23/2022    2:33 PM  6CIT Screen  What Year? 0 points  What month? 0 points  What  time? 0 points  Count back from 20 0 points  Months in reverse 0 points  Repeat phrase 0 points  Total Score 0 points    Immunizations Immunization History  Administered Date(s) Administered   Fluad Quad(high Dose 65+) 05/03/2019, 05/11/2020, 05/21/2020, 04/17/2021   Influenza Split 08/31/2011, 05/25/2012   Influenza Whole 08/26/2009   Influenza, High Dose Seasonal PF 07/30/2018   Influenza,inj,Quad PF,6+ Mos 05/05/2015, 04/27/2016   Influenza,inj,quad, With Preservative 05/01/2020   Influenza-Unspecified 04/12/2013, 03/31/2014   Moderna Sars-Covid-2 Vaccination 09/05/2019, 10/03/2019, 06/07/2020   PFIZER SARS-COV-2 Pediatric Vaccination 5-18yr 06/05/2020   Pneumococcal Conjugate-13 07/19/2013   Pneumococcal Polysaccharide-23 03/06/2009, 09/06/2017   Td 03/05/2010   Tdap 03/23/2020   Zoster Recombinat (Shingrix) 09/14/2018, 01/18/2019   Zoster, Live 03/17/2006    TDAP status: Up to date  Flu Vaccine status: Up to date  Pneumococcal vaccine status: Up to  date  Covid-19 vaccine status: Completed vaccines  Qualifies for Shingles Vaccine? Yes   Zostavax completed Yes   Shingrix Completed?: Yes  Screening Tests Health Maintenance  Topic Date Due   COVID-19 Vaccine (4 - Moderna series) 08/02/2020   INFLUENZA VACCINE  03/01/2022   TETANUS/TDAP  03/23/2030   Pneumonia Vaccine 81 Years old  Completed   DEXA SCAN  Completed   Zoster Vaccines- Shingrix  Completed   HPV VACCINES  Aged Out    Health Maintenance  Health Maintenance Due  Topic Date Due   COVID-19 Vaccine (4 - Moderna series) 08/02/2020    Colorectal cancer screening: No longer required.   Mammogram status: Completed 12/31/2021. Repeat every year  Bone Density status: Completed 01/14/2022; pending results  Lung Cancer Screening: (Low Dose CT Chest recommended if Age 83-80years, 30 pack-year currently smoking OR have quit w/in 15years.) does not qualify.   Lung Cancer Screening Referral: no  Additional Screening:  Hepatitis C Screening: does not qualify; Completed no  Vision Screening: Recommended annual ophthalmology exams for early detection of glaucoma and other disorders of the eye. Is the patient up to date with their annual eye exam?  Yes  Who is the provider or what is the name of the office in which the patient attends annual eye exams? AJulian Reil MD. If pt is not established with a provider, would they like to be referred to a provider to establish care? No .   Dental Screening: Recommended annual dental exams for proper oral hygiene  Community Resource Referral / Chronic Care Management: CRR required this visit?  No   CCM required this visit?  No      Plan:     I have personally reviewed and noted the following in the patient's chart:   Medical and social history Use of alcohol, tobacco or illicit drugs  Current medications and supplements including opioid prescriptions.  Functional ability and status Nutritional status Physical  activity Advanced directives List of other physicians Hospitalizations, surgeries, and ER visits in previous 12 months Vitals Screenings to include cognitive, depression, and falls Referrals and appointments  In addition, I have reviewed and discussed with patient certain preventive protocols, quality metrics, and best practice recommendations. A written personalized care plan for preventive services as well as general preventive health recommendations were provided to patient.     SSheral Flow LPN   [id number]  Nurse Notes:  Patient is cogitatively intact. There were no vitals filed for this visit. There is no height or weight on file to calculate BMI. Patient stated that she has no issues with  gait or balance; does not use any assistive devices.

## 2022-02-23 NOTE — Patient Instructions (Signed)
[name] , Thank you for taking time to come for your Medicare Wellness Visit. I appreciate your ongoing commitment to your health goals. Please review the following plan we discussed and let me know if I can assist you in the future.   Screening recommendations/referrals: Colonoscopy: No longer recommended due to age 81: 12/31/2021; awaiting results Bone Density: 01/14/2022; awaiting results Recommended yearly ophthalmology/optometry visit for glaucoma screening and checkup Recommended yearly dental visit for hygiene and checkup  Vaccinations: Influenza vaccine: 04/17/2021 Pneumococcal vaccine: 07/19/2013, 09/06/2017 Tdap vaccine: 03/23/2020; due every 10 years Shingles vaccine: 09/14/2018, 01/18/2019   Covid-19: 09/05/2019, 10/03/2019, 06/07/2020  Advanced directives: Yes; Please bring a copy of your health care power of attorney and living will to the office at your convenience.  Conditions/risks identified: Yes  Next appointment: Please schedule your next Medicare Wellness Visit with your Nurse Health Advisor in 1 year by calling [phone].   Preventive Care 75 Years and Older, Female Preventive care refers to lifestyle choices and visits with your health care provider that can promote health and wellness. What does preventive care include? A yearly physical exam. This is also called an annual well check. Dental exams once or twice a year. Routine eye exams. Ask your health care provider how often you should have your eyes checked. Personal lifestyle choices, including: Daily care of your teeth and gums. Regular physical activity. Eating a healthy diet. Avoiding tobacco and drug use. Limiting alcohol use. Practicing safe sex. Taking low-dose aspirin every day. Taking vitamin and mineral supplements as recommended by your health care provider. What happens during an annual well check? The services and screenings done by your health care provider during your annual well check  will depend on your age, overall health, lifestyle risk factors, and family history of disease. Counseling  Your health care provider may ask you questions about your: Alcohol use. Tobacco use. Drug use. Emotional well-being. Home and relationship well-being. Sexual activity. Eating habits. History of falls. Memory and ability to understand (cognition). Work and work Statistician. Reproductive health. Screening  You may have the following tests or measurements: Height, weight, and BMI. Blood pressure. Lipid and cholesterol levels. These may be checked every 5 years, or more frequently if you are over 44 years old. Skin check. Lung cancer screening. You may have this screening every year starting at age 65 if you have a 30-pack-year history of smoking and currently smoke or have quit within the past 15 years. Fecal occult blood test (FOBT) of the stool. You may have this test every year starting at age 38. Flexible sigmoidoscopy or colonoscopy. You may have a sigmoidoscopy every 5 years or a colonoscopy every 10 years starting at age 41. Hepatitis C blood test. Hepatitis B blood test. Sexually transmitted disease (STD) testing. Diabetes screening. This is done by checking your blood sugar (glucose) after you have not eaten for a while (fasting). You may have this done every 1-3 years. Bone density scan. This is done to screen for osteoporosis. You may have this done starting at age 20. Mammogram. This may be done every 1-2 years. Talk to your health care provider about how often you should have regular mammograms. Talk with your health care provider about your test results, treatment options, and if necessary, the need for more tests. Vaccines  Your health care provider may recommend certain vaccines, such as: Influenza vaccine. This is recommended every year. Tetanus, diphtheria, and acellular pertussis (Tdap, Td) vaccine. You may need a Td booster every 10  years. Zoster vaccine. You  may need this after age 80. Pneumococcal 13-valent conjugate (PCV13) vaccine. One dose is recommended after age 83. Pneumococcal polysaccharide (PPSV23) vaccine. One dose is recommended after age 25. Talk to your health care provider about which screenings and vaccines you need and how often you need them. This information is not intended to replace advice given to you by your health care provider. Make sure you discuss any questions you have with your health care provider. Document Released: 08/14/2015 Document Revised: 04/06/2016 Document Reviewed: 05/19/2015 Elsevier Interactive Patient Education  2017 [place] Prevention in the Home Falls can cause injuries. They can happen to people of all ages. There are many things you can do to make your home safe and to help prevent falls. What can I do on the outside of my home? Regularly fix the edges of walkways and driveways and fix any cracks. Remove anything that might make you trip as you walk through a door, such as a raised step or threshold. Trim any bushes or trees on the path to your home. Use bright outdoor lighting. Clear any walking paths of anything that might make someone trip, such as rocks or tools. Regularly check to see if handrails are loose or broken. Make sure that both sides of any steps have handrails. Any raised decks and porches should have guardrails on the edges. Have any leaves, snow, or ice cleared regularly. Use sand or salt on walking paths during winter. Clean up any spills in your garage right away. This includes oil or grease spills. What can I do in the bathroom? Use night lights. Install grab bars by the toilet and in the tub and shower. Do not use towel bars as grab bars. Use non-skid mats or decals in the tub or shower. If you need to sit down in the shower, use a plastic, non-slip stool. Keep the floor dry. Clean up any water that spills on the floor as soon as it happens. Remove soap buildup  in the tub or shower regularly. Attach bath mats securely with double-sided non-slip rug tape. Do not have throw rugs and other things on the floor that can make you trip. What can I do in the bedroom? Use night lights. Make sure that you have a light by your bed that is easy to reach. Do not use any sheets or blankets that are too big for your bed. They should not hang down onto the floor. Have a firm chair that has side arms. You can use this for support while you get dressed. Do not have throw rugs and other things on the floor that can make you trip. What can I do in the kitchen? Clean up any spills right away. Avoid walking on wet floors. Keep items that you use a lot in easy-to-reach places. If you need to reach something above you, use a strong step stool that has a grab bar. Keep electrical cords out of the way. Do not use floor polish or wax that makes floors slippery. If you must use wax, use non-skid floor wax. Do not have throw rugs and other things on the floor that can make you trip. What can I do with my stairs? Do not leave any items on the stairs. Make sure that there are handrails on both sides of the stairs and use them. Fix handrails that are broken or loose. Make sure that handrails are as long as the stairways. Check any carpeting to make sure  that it is firmly attached to the stairs. Fix any carpet that is loose or worn. Avoid having throw rugs at the top or bottom of the stairs. If you do have throw rugs, attach them to the floor with carpet tape. Make sure that you have a light switch at the top of the stairs and the bottom of the stairs. If you do not have them, ask someone to add them for you. What else can I do to help prevent falls? Wear shoes that: Do not have high heels. Have rubber bottoms. Are comfortable and fit you well. Are closed at the toe. Do not wear sandals. If you use a stepladder: Make sure that it is fully opened. Do not climb a closed  stepladder. Make sure that both sides of the stepladder are locked into place. Ask someone to hold it for you, if possible. Clearly mark and make sure that you can see: Any grab bars or handrails. First and last steps. Where the edge of each step is. Use tools that help you move around (mobility aids) if they are needed. These include: Canes. Walkers. Scooters. Crutches. Turn on the lights when you go into a dark area. Replace any light bulbs as soon as they burn out. Set up your furniture so you have a clear path. Avoid moving your furniture around. If any of your floors are uneven, fix them. If there are any pets around you, be aware of where they are. Review your medicines with your doctor. Some medicines can make you feel dizzy. This can increase your chance of falling. Ask your doctor what other things that you can do to help prevent falls. This information is not intended to replace advice given to you by your health care provider. Make sure you discuss any questions you have with your health care provider. Document Released: 05/14/2009 Document Revised: 12/24/2015 Document Reviewed: 08/22/2014 Elsevier Interactive Patient Education  2017 [employer].

## 2022-02-24 DIAGNOSIS — M9905 Segmental and somatic dysfunction of pelvic region: Secondary | ICD-10-CM | POA: Diagnosis not present

## 2022-02-24 DIAGNOSIS — M9904 Segmental and somatic dysfunction of sacral region: Secondary | ICD-10-CM | POA: Diagnosis not present

## 2022-02-24 DIAGNOSIS — M9903 Segmental and somatic dysfunction of lumbar region: Secondary | ICD-10-CM | POA: Diagnosis not present

## 2022-02-24 DIAGNOSIS — M5431 Sciatica, right side: Secondary | ICD-10-CM | POA: Diagnosis not present

## 2022-03-02 DIAGNOSIS — M9905 Segmental and somatic dysfunction of pelvic region: Secondary | ICD-10-CM | POA: Diagnosis not present

## 2022-03-02 DIAGNOSIS — M9903 Segmental and somatic dysfunction of lumbar region: Secondary | ICD-10-CM | POA: Diagnosis not present

## 2022-03-02 DIAGNOSIS — M5431 Sciatica, right side: Secondary | ICD-10-CM | POA: Diagnosis not present

## 2022-03-02 DIAGNOSIS — M9904 Segmental and somatic dysfunction of sacral region: Secondary | ICD-10-CM | POA: Diagnosis not present

## 2022-03-09 DIAGNOSIS — M9903 Segmental and somatic dysfunction of lumbar region: Secondary | ICD-10-CM | POA: Diagnosis not present

## 2022-03-09 DIAGNOSIS — M9905 Segmental and somatic dysfunction of pelvic region: Secondary | ICD-10-CM | POA: Diagnosis not present

## 2022-03-09 DIAGNOSIS — M9904 Segmental and somatic dysfunction of sacral region: Secondary | ICD-10-CM | POA: Diagnosis not present

## 2022-03-09 DIAGNOSIS — M5431 Sciatica, right side: Secondary | ICD-10-CM | POA: Diagnosis not present

## 2022-03-14 ENCOUNTER — Other Ambulatory Visit: Payer: Self-pay | Admitting: Physician Assistant

## 2022-03-21 ENCOUNTER — Other Ambulatory Visit: Payer: Self-pay | Admitting: Internal Medicine

## 2022-03-21 DIAGNOSIS — M9905 Segmental and somatic dysfunction of pelvic region: Secondary | ICD-10-CM | POA: Diagnosis not present

## 2022-03-21 DIAGNOSIS — M5431 Sciatica, right side: Secondary | ICD-10-CM | POA: Diagnosis not present

## 2022-03-21 DIAGNOSIS — M9903 Segmental and somatic dysfunction of lumbar region: Secondary | ICD-10-CM | POA: Diagnosis not present

## 2022-03-21 DIAGNOSIS — M9904 Segmental and somatic dysfunction of sacral region: Secondary | ICD-10-CM | POA: Diagnosis not present

## 2022-03-23 DIAGNOSIS — D2262 Melanocytic nevi of left upper limb, including shoulder: Secondary | ICD-10-CM | POA: Diagnosis not present

## 2022-03-23 DIAGNOSIS — L814 Other melanin hyperpigmentation: Secondary | ICD-10-CM | POA: Diagnosis not present

## 2022-03-23 DIAGNOSIS — Z85820 Personal history of malignant melanoma of skin: Secondary | ICD-10-CM | POA: Diagnosis not present

## 2022-03-23 DIAGNOSIS — D485 Neoplasm of uncertain behavior of skin: Secondary | ICD-10-CM | POA: Diagnosis not present

## 2022-03-23 DIAGNOSIS — L821 Other seborrheic keratosis: Secondary | ICD-10-CM | POA: Diagnosis not present

## 2022-03-28 DIAGNOSIS — M9905 Segmental and somatic dysfunction of pelvic region: Secondary | ICD-10-CM | POA: Diagnosis not present

## 2022-03-28 DIAGNOSIS — M9904 Segmental and somatic dysfunction of sacral region: Secondary | ICD-10-CM | POA: Diagnosis not present

## 2022-03-28 DIAGNOSIS — M9903 Segmental and somatic dysfunction of lumbar region: Secondary | ICD-10-CM | POA: Diagnosis not present

## 2022-03-28 DIAGNOSIS — M5431 Sciatica, right side: Secondary | ICD-10-CM | POA: Diagnosis not present

## 2022-03-31 ENCOUNTER — Other Ambulatory Visit: Payer: Self-pay | Admitting: Internal Medicine

## 2022-04-05 DIAGNOSIS — M9903 Segmental and somatic dysfunction of lumbar region: Secondary | ICD-10-CM | POA: Diagnosis not present

## 2022-04-05 DIAGNOSIS — M9904 Segmental and somatic dysfunction of sacral region: Secondary | ICD-10-CM | POA: Diagnosis not present

## 2022-04-05 DIAGNOSIS — M5431 Sciatica, right side: Secondary | ICD-10-CM | POA: Diagnosis not present

## 2022-04-05 DIAGNOSIS — M9905 Segmental and somatic dysfunction of pelvic region: Secondary | ICD-10-CM | POA: Diagnosis not present

## 2022-04-06 ENCOUNTER — Encounter: Payer: Self-pay | Admitting: Internal Medicine

## 2022-04-12 DIAGNOSIS — M9904 Segmental and somatic dysfunction of sacral region: Secondary | ICD-10-CM | POA: Diagnosis not present

## 2022-04-12 DIAGNOSIS — M9905 Segmental and somatic dysfunction of pelvic region: Secondary | ICD-10-CM | POA: Diagnosis not present

## 2022-04-12 DIAGNOSIS — M5431 Sciatica, right side: Secondary | ICD-10-CM | POA: Diagnosis not present

## 2022-04-12 DIAGNOSIS — M9903 Segmental and somatic dysfunction of lumbar region: Secondary | ICD-10-CM | POA: Diagnosis not present

## 2022-04-19 DIAGNOSIS — M5431 Sciatica, right side: Secondary | ICD-10-CM | POA: Diagnosis not present

## 2022-04-19 DIAGNOSIS — M9904 Segmental and somatic dysfunction of sacral region: Secondary | ICD-10-CM | POA: Diagnosis not present

## 2022-04-19 DIAGNOSIS — M9905 Segmental and somatic dysfunction of pelvic region: Secondary | ICD-10-CM | POA: Diagnosis not present

## 2022-04-19 DIAGNOSIS — M9903 Segmental and somatic dysfunction of lumbar region: Secondary | ICD-10-CM | POA: Diagnosis not present

## 2022-04-26 DIAGNOSIS — M5431 Sciatica, right side: Secondary | ICD-10-CM | POA: Diagnosis not present

## 2022-04-26 DIAGNOSIS — M9904 Segmental and somatic dysfunction of sacral region: Secondary | ICD-10-CM | POA: Diagnosis not present

## 2022-04-26 DIAGNOSIS — M9903 Segmental and somatic dysfunction of lumbar region: Secondary | ICD-10-CM | POA: Diagnosis not present

## 2022-04-26 DIAGNOSIS — M9905 Segmental and somatic dysfunction of pelvic region: Secondary | ICD-10-CM | POA: Diagnosis not present

## 2022-05-03 DIAGNOSIS — M9904 Segmental and somatic dysfunction of sacral region: Secondary | ICD-10-CM | POA: Diagnosis not present

## 2022-05-03 DIAGNOSIS — M9905 Segmental and somatic dysfunction of pelvic region: Secondary | ICD-10-CM | POA: Diagnosis not present

## 2022-05-03 DIAGNOSIS — M9903 Segmental and somatic dysfunction of lumbar region: Secondary | ICD-10-CM | POA: Diagnosis not present

## 2022-05-03 DIAGNOSIS — M5431 Sciatica, right side: Secondary | ICD-10-CM | POA: Diagnosis not present

## 2022-05-08 ENCOUNTER — Encounter: Payer: Self-pay | Admitting: Internal Medicine

## 2022-05-08 DIAGNOSIS — Z23 Encounter for immunization: Secondary | ICD-10-CM | POA: Diagnosis not present

## 2022-05-10 DIAGNOSIS — M9905 Segmental and somatic dysfunction of pelvic region: Secondary | ICD-10-CM | POA: Diagnosis not present

## 2022-05-10 DIAGNOSIS — M9904 Segmental and somatic dysfunction of sacral region: Secondary | ICD-10-CM | POA: Diagnosis not present

## 2022-05-10 DIAGNOSIS — M9903 Segmental and somatic dysfunction of lumbar region: Secondary | ICD-10-CM | POA: Diagnosis not present

## 2022-05-10 DIAGNOSIS — M5431 Sciatica, right side: Secondary | ICD-10-CM | POA: Diagnosis not present

## 2022-05-18 DIAGNOSIS — M9904 Segmental and somatic dysfunction of sacral region: Secondary | ICD-10-CM | POA: Diagnosis not present

## 2022-05-18 DIAGNOSIS — M5431 Sciatica, right side: Secondary | ICD-10-CM | POA: Diagnosis not present

## 2022-05-18 DIAGNOSIS — M9905 Segmental and somatic dysfunction of pelvic region: Secondary | ICD-10-CM | POA: Diagnosis not present

## 2022-05-18 DIAGNOSIS — M9903 Segmental and somatic dysfunction of lumbar region: Secondary | ICD-10-CM | POA: Diagnosis not present

## 2022-05-20 NOTE — Progress Notes (Unsigned)
05/23/2022 Elizabeth Gray [id number] 1940-08-30  Referring provider: Cassandria Anger, MD Primary GI doctor: Dr. Carlean Purl  ASSESSMENT AND PLAN:  Dysphagia with chronic GERD Multiple EGD's in the past with dilatation, last one was 2019 which helped.  Progressive dysphagia with regurg, soft diet until EGD EGD with dilatation to evaluate for structural abnormality, tumor, erosive/infectious esophagititis, and EOE.   Likely component of dysmotility as well, given information, may benefit from barium swallow after Will refill Zegrid I discussed risks of EGD with patient today, including risk of sedation, bleeding or perforation.  Patient provides understanding and gave verbal consent to proceed.  History of Present Illness:  81 y.o. female  with a past medical history of CAD, hypothyroidism, RLS, personal history of adenomatous colon polyps, chronic GERD, former smoker Q 1992, 17 pack year and others listed below, returns to clinic today for evaluation of dysphagia.  colonoscopy August 2019 with removal of a 3 mm polyp in the ascending colon which is a tubular adenoma, no follow-up due to age  EGD in October 2019 with finding of a mildly tortuous distal esophagus, she was TTS dilated to 20 and was also noted to have patchy gastritis.  Patient says that she did have good response from the dilation  10/02/2020 office visit Amy [facility] GERD with possible component of dysmotility, some dysphagia at that time.  Refilled sacred, discussed dysmotility/dysphagia recommendations, opted for monitoring of symptoms rather than repeat EGD with dilatation.  Presents today with worsening dysphagia.  She states she was told in the past she had motility disorder with her esophagus but has had 2-3 dilatations in the past every 3-4 years that has helped. Last time was 05/2018.  She states she will eat bread, potatoes and pills, will get esophageal dysphagia, will drink water afterwards and feel it  "bubble up", can have regurgitation of the food/water.  She has GERD, she was on zegrid, has not had refill due to lack of OV. Last dose 3-4 weeks ago with worsening GERD, having some night time symptoms.  No NSAIDS, no ETOH.  No sodas, teas, just 1 cup decaf coffee in the AM, just water otherwise.  She goes to bed 10-11, has head of bed mechanically elevated, she eats dinner at 7-8 pm. Since adding on fiber has BM almost daily, no melena, no hematochezia.  No unintentional weight loss, has gained weight, started nutrisystem this AM.   She  reports that she quit smoking about 31 years ago. Her smoking use included cigarettes. She has a 17.00 pack-year smoking history. She has never used smokeless tobacco. She reports that she does not drink alcohol and does not use drugs. Her family history includes Brain cancer in her father; Breast cancer in her paternal aunt; Colon cancer in her maternal aunt; Esophageal cancer in her maternal grandfather; Migraines in her mother.   Current Medications:   Current Outpatient Medications (Endocrine & Metabolic):    levothyroxine (SYNTHROID) 100 MCG tablet, Take 1 tablet (100 mcg total) by mouth daily.  Current Outpatient Medications (Cardiovascular):    metoprolol succinate (TOPROL-XL) 25 MG 24 hr tablet, Take 0.5 tablets (12.5 mg total) by mouth 2 (two) times daily.   pravastatin (PRAVACHOL) 20 MG tablet, Take 1 tablet (20 mg total) by mouth daily.   Current Outpatient Medications (Analgesics):    ASPIRIN 81 PO, Take by mouth.   traMADol (ULTRAM) 50 MG tablet, Take 1-2 tablets (50-100 mg total) by mouth every 6 (six) hours as needed for moderate  pain or severe pain.   Acetaminophen (TYLENOL PO), Take by mouth. (Patient not taking: Reported on 05/23/2022)  Current Outpatient Medications (Hematological):    cyanocobalamin (VITAMIN B12) 1000 MCG/ML injection, INJECT 1ML INTRAMUSCULARLY ONCE EVERY 14 DAYS  Current Outpatient Medications (Other):    Alum  Hydroxide-Mag Carbonate (GAVISCON PO), Take 1 tablet by mouth 3 (three) times daily after meals.   Cholecalciferol (VITAMIN D PO), Take 1,000 Units by mouth every other day.    Flaxseed, Linseed, (FLAXSEED OIL) 1000 MG CAPS, Take 1,000 mg by mouth daily.   Glucosamine-Chondroitin (MOVE FREE PO), Take 1 tablet by mouth in the morning and at bedtime.   magnesium oxide (MAG-OX) 400 MG tablet, Take 400 mg by mouth at bedtime.   Multiple Vitamins-Minerals (ICAPS AREDS FORMULA PO), Take 1 capsule by mouth in the morning and at bedtime.   Polyethyl Glycol-Propyl Glycol 0.4-0.3 % SOLN, Place 2-3 drops into both eyes 2 (two) times daily as needed (dry eyes).   pramipexole (MIRAPEX) 1 MG tablet, Take 0.5-1 tablets (0.5-1 mg total) by mouth at bedtime.   SYRINGE-NEEDLE, DISP, 3 ML (B-D 3CC LUER-LOK SYR 25GX1") 25G X 1" 3 ML MISC, USE AS DIRECTED FOR B12 INJECTIONS   zolpidem (AMBIEN) 10 MG tablet, Take 1 tablet (10 mg total) by mouth at bedtime as needed. for sleep   omeprazole-sodium bicarbonate (ZEGERID) 40-1100 MG capsule, TAKE 1 CAPSULE BY MOUTH ONCE DAILY BEFORE BREAKFAST  Surgical History:  She  has a past surgical history that includes Appendectomy (1988); Lumbar disc surgery (1995); Knee arthroscopy (Bilateral); Cholecystectomy (2000); Carpal tunnel release (2002); Hand surgery (1997); Colonoscopy (12/19/2006); Esophagogastroduodenoscopy (12/19/2006); Toe Surgery (Left); Total knee arthroplasty (Right, 05/09/2014); Wisdom tooth extraction; Melanoma Removal  (Right, 2018); Upper gastrointestinal endoscopy; Total knee arthroplasty (Left, 08/06/2021); and Rotator cuff repair (Left).  Current Medications, Allergies, Past Medical History, Past Surgical History, Family History and Social History were reviewed in [employer] record.  Physical Exam: BP 134/72   Pulse 61   Ht 5' 3.5" (1.613 m)   Wt 157 lb 6 oz (71.4 kg)   BMI 27.44 kg/m  General:   Pleasant, well developed female,  younger than stated age, in no acute distress Heart : Regular rate and rhythm; no murmurs Pulm: Clear anteriorly; no wheezing Abdomen:  Soft, Obese AB, Active bowel sounds. No tenderness . , No organomegaly appreciated. Rectal: Not evaluated Extremities:  without  edema. Neurologic:  Alert and  oriented x4;  No focal deficits.  Psych:  Cooperative. Normal mood and affect.   Vladimir Crofts, PA-C 05/23/22

## 2022-05-23 ENCOUNTER — Encounter: Payer: Self-pay | Admitting: Physician Assistant

## 2022-05-23 ENCOUNTER — Ambulatory Visit (INDEPENDENT_AMBULATORY_CARE_PROVIDER_SITE_OTHER): Payer: Medicare Other | Admitting: Physician Assistant

## 2022-05-23 VITALS — BP 134/72 | HR 61 | Ht 63.5 in | Wt 157.4 lb

## 2022-05-23 DIAGNOSIS — K219 Gastro-esophageal reflux disease without esophagitis: Secondary | ICD-10-CM | POA: Diagnosis not present

## 2022-05-23 DIAGNOSIS — R1310 Dysphagia, unspecified: Secondary | ICD-10-CM

## 2022-05-23 MED ORDER — OMEPRAZOLE-SODIUM BICARBONATE 40-1100 MG PO CAPS: ORAL_CAPSULE | ORAL | 3 refills | Status: DC

## 2022-05-23 NOTE — Patient Instructions (Addendum)
_______________________________________________________  If you are age 83 or older, your body mass index should be between 23-30. Your Body mass index is 27.44 kg/m. If this is out of the aforementioned range listed, please consider follow up with your Primary Care Provider.  If you are age 26 or younger, your body mass index should be between 19-25. Your Body mass index is 27.44 kg/m. If this is out of the aformentioned range listed, please consider follow up with your Primary Care Provider.   ________________________________________________________  The [facility] providers would like to encourage you to use [facility] to communicate with providers for non-urgent requests or questions.  Due to long hold times on the telephone, sending your provider a message by [facility] may be a faster and more efficient way to get a response.  Please allow 48 business hours for a response.  Please remember that this is for non-urgent requests.  _______________________________________________________  Elizabeth Gray have been scheduled for an endoscopy. Please follow written instructions given to you at your visit today. If you use inhalers (even only as needed), please bring them with you on the day of your procedure.  We have sent the following medications to your pharmacy for you to pick up at your convenience: [place] and Dietary Strategies for Management of Esophageal Dysmotility/dysphagia 1. Take reflux medications 30+ minutes before food in the morning.  2. Begin meals with warm beverage 3. Eat smaller more frequent meals 4. Eat slowly, taking small bites and sips 5. Alternate solids and liquids 6. Avoid foods/liquids that increase acid production 7. Sit upright during and for 30+ minutes after meals to facilitate esophageal clearing 8. Can try altoid melting in mouth before food  Thank you for entrusting me with your care and for choosing [facility], Vicie Mutters, P.A.-C

## 2022-05-24 ENCOUNTER — Ambulatory Visit (INDEPENDENT_AMBULATORY_CARE_PROVIDER_SITE_OTHER): Payer: Medicare Other | Admitting: Internal Medicine

## 2022-05-24 ENCOUNTER — Encounter: Payer: Self-pay | Admitting: Internal Medicine

## 2022-05-24 DIAGNOSIS — E039 Hypothyroidism, unspecified: Secondary | ICD-10-CM

## 2022-05-24 DIAGNOSIS — F5101 Primary insomnia: Secondary | ICD-10-CM | POA: Diagnosis not present

## 2022-05-24 DIAGNOSIS — E538 Deficiency of other specified B group vitamins: Secondary | ICD-10-CM

## 2022-05-24 DIAGNOSIS — D649 Anemia, unspecified: Secondary | ICD-10-CM | POA: Diagnosis not present

## 2022-05-24 LAB — CBC WITH DIFFERENTIAL/PLATELET
Basophils Absolute: 0.1 10*3/uL (ref 0.0–0.1)
Basophils Relative: 1.1 % (ref 0.0–3.0)
Eosinophils Absolute: 0.4 10*3/uL (ref 0.0–0.7)
Eosinophils Relative: 4.7 % (ref 0.0–5.0)
HCT: 36.9 % (ref 36.0–46.0)
Hemoglobin: 12.1 g/dL (ref 12.0–15.0)
Lymphocytes Relative: 29.9 % (ref 12.0–46.0)
Lymphs Abs: 2.3 10*3/uL (ref 0.7–4.0)
MCHC: 32.8 g/dL (ref 30.0–36.0)
MCV: 82.4 fl (ref 78.0–100.0)
Monocytes Absolute: 0.9 10*3/uL (ref 0.1–1.0)
Monocytes Relative: 12 % (ref 3.0–12.0)
Neutro Abs: 4 10*3/uL (ref 1.4–7.7)
Neutrophils Relative %: 52.3 % (ref 43.0–77.0)
Platelets: 303 10*3/uL (ref 150.0–400.0)
RBC: 4.48 Mil/uL (ref 3.87–5.11)
RDW: 15 % (ref 11.5–15.5)
WBC: 7.6 10*3/uL (ref 4.0–10.5)

## 2022-05-24 LAB — COMPREHENSIVE METABOLIC PANEL
ALT: 18 U/L (ref 0–35)
AST: 26 U/L (ref 0–37)
Albumin: 4 g/dL (ref 3.5–5.2)
Alkaline Phosphatase: 71 U/L (ref 39–117)
BUN: 21 mg/dL (ref 6–23)
CO2: 30 mEq/L (ref 19–32)
Calcium: 9.6 mg/dL (ref 8.4–10.5)
Chloride: 103 mEq/L (ref 96–112)
Creatinine, Ser: 0.67 mg/dL (ref 0.40–1.20)
GFR: 80.78 mL/min (ref >60.00)
Glucose, Bld: 102 mg/dL — ABNORMAL HIGH (ref 70–99)
Potassium: 4.1 mEq/L (ref 3.5–5.1)
Sodium: 141 mEq/L (ref 135–145)
Total Bilirubin: 0.5 mg/dL (ref 0.2–1.2)
Total Protein: 7.2 g/dL (ref 6.0–8.3)

## 2022-05-24 LAB — TSH: TSH: 2.65 u[IU]/mL (ref 0.35–5.50)

## 2022-05-24 MED ORDER — PRAMIPEXOLE DIHYDROCHLORIDE 1 MG PO TABS: 1.0000 mg | ORAL_TABLET | Freq: Every day | ORAL | 3 refills | Status: DC

## 2022-05-24 NOTE — Progress Notes (Signed)
Subjective:  Patient ID: Elizabeth Gray, female    DOB: 05-26-1941  Age: 81 y.o. MRN: [id number]  CC: Medicare Wellness   HPI Elizabeth Gray presents for RLS and insomnia - worse... F/u on hypothyroidism, anemia   Outpatient Medications Prior to Visit  Medication Sig Dispense Refill   Acetaminophen (TYLENOL PO) Take by mouth.     Alum Hydroxide-Mag Carbonate (GAVISCON PO) Take 1 tablet by mouth 3 (three) times daily after meals.     ASPIRIN 81 PO Take by mouth.     Cholecalciferol (VITAMIN D PO) Take 1,000 Units by mouth every other day.      cyanocobalamin (VITAMIN B12) 1000 MCG/ML injection INJECT 1ML INTRAMUSCULARLY ONCE EVERY 14 DAYS 2 mL 2   Flaxseed, Linseed, (FLAXSEED OIL) 1000 MG CAPS Take 1,000 mg by mouth daily.     Glucosamine-Chondroitin (MOVE FREE PO) Take 1 tablet by mouth in the morning and at bedtime.     levothyroxine (SYNTHROID) 100 MCG tablet Take 1 tablet (100 mcg total) by mouth daily. 90 tablet 3   metoprolol succinate (TOPROL-XL) 25 MG 24 hr tablet Take 0.5 tablets (12.5 mg total) by mouth 2 (two) times daily. 90 tablet 3   Multiple Vitamins-Minerals (ICAPS AREDS FORMULA PO) Take 1 capsule by mouth in the morning and at bedtime.     omeprazole-sodium bicarbonate (ZEGERID) 40-1100 MG capsule TAKE 1 CAPSULE BY MOUTH ONCE DAILY BEFORE BREAKFAST 90 capsule 3   Polyethyl Glycol-Propyl Glycol 0.4-0.3 % SOLN Place 2-3 drops into both eyes 2 (two) times daily as needed (dry eyes).     pravastatin (PRAVACHOL) 20 MG tablet Take 1 tablet (20 mg total) by mouth daily. 90 tablet 3   SYRINGE-NEEDLE, DISP, 3 ML (B-D 3CC LUER-LOK SYR 25GX1") 25G X 1" 3 ML MISC USE AS DIRECTED FOR B12 INJECTIONS 2 each 3   traMADol (ULTRAM) 50 MG tablet Take 1-2 tablets (50-100 mg total) by mouth every 6 (six) hours as needed for moderate pain or severe pain. 40 tablet 0   zolpidem (AMBIEN) 10 MG tablet Take 1 tablet (10 mg total) by mouth at bedtime as needed. for sleep 90 tablet 1    pramipexole (MIRAPEX) 1 MG tablet Take 0.5-1 tablets (0.5-1 mg total) by mouth at bedtime. 90 tablet 3   magnesium oxide (MAG-OX) 400 MG tablet Take 400 mg by mouth at bedtime. (Patient not taking: Reported on 05/24/2022)     No facility-administered medications prior to visit.    ROS: Review of Systems  Constitutional:  Positive for unexpected weight change. Negative for activity change, appetite change, chills and fatigue.  HENT:  Negative for congestion, mouth sores and sinus pressure.   Eyes:  Negative for visual disturbance.  Respiratory:  Negative for cough and chest tightness.   Gastrointestinal:  Negative for abdominal pain and nausea.  Genitourinary:  Negative for difficulty urinating, frequency and vaginal pain.  Musculoskeletal:  Positive for arthralgias and gait problem. Negative for back pain.  Skin:  Negative for pallor and rash.  Neurological:  Negative for dizziness, tremors, weakness, numbness and headaches.  Psychiatric/Behavioral:  Positive for sleep disturbance. Negative for confusion and suicidal ideas.     Objective:  BP 130/62 (BP Location: Left Arm)   Pulse (!) 59   Temp 98.6 F (37 C) (Oral)   Ht 5' 3.5" (1.613 m)   Wt 155 lb 9.6 oz (70.6 kg)   SpO2 95%   BMI 27.13 kg/m   BP Readings from Last 3 Encounters:  05/24/22 130/62  05/23/22 134/72  01/27/22 127/79    Wt Readings from Last 3 Encounters:  05/24/22 155 lb 9.6 oz (70.6 kg)  05/23/22 157 lb 6 oz (71.4 kg)  01/27/22 147 lb 3.2 oz (66.8 kg)    Physical Exam Constitutional:      General: She is not in acute distress.    Appearance: She is well-developed. She is obese.  HENT:     Head: Normocephalic.     Right Ear: External ear normal.     Left Ear: External ear normal.     Nose: Nose normal.  Eyes:     General:        Right eye: No discharge.        Left eye: No discharge.     Conjunctiva/sclera: Conjunctivae normal.     Pupils: Pupils are equal, round, and reactive to light.  Neck:      Thyroid: No thyromegaly.     Vascular: No JVD.     Trachea: No tracheal deviation.  Cardiovascular:     Rate and Rhythm: Normal rate and regular rhythm.     Heart sounds: Normal heart sounds.  Pulmonary:     Effort: No respiratory distress.     Breath sounds: No stridor. No wheezing.  Abdominal:     General: Bowel sounds are normal. There is no distension.     Palpations: Abdomen is soft. There is no mass.     Tenderness: There is no abdominal tenderness. There is no guarding or rebound.  Musculoskeletal:        General: No tenderness.     Cervical back: Normal range of motion and neck supple. No rigidity.  Lymphadenopathy:     Cervical: No cervical adenopathy.  Skin:    Findings: No erythema or rash.  Neurological:     Cranial Nerves: No cranial nerve deficit.     Motor: No abnormal muscle tone.     Coordination: Coordination normal.     Deep Tendon Reflexes: Reflexes normal.  Psychiatric:        Behavior: Behavior normal.        Thought Content: Thought content normal.        Judgment: Judgment normal.     Lab Results  Component Value Date   WBC 7.9 01/24/2022   HGB 12.0 01/24/2022   HCT 36.3 01/24/2022   PLT 332.0 01/24/2022   GLUCOSE 91 01/24/2022   CHOL 146 11/22/2021   TRIG 91.0 11/22/2021   HDL 65.60 11/22/2021   LDLCALC 63 11/22/2021   ALT 19 01/24/2022   AST 25 01/24/2022   NA 140 01/24/2022   K 4.6 01/24/2022   CL 100 01/24/2022   CREATININE 0.90 01/24/2022   BUN 26 (H) 01/24/2022   CO2 32 01/24/2022   TSH 7.39 (H) 11/22/2021   INR 3.03 (H) 05/12/2014    MR BRAIN W WO CONTRAST  Result Date: 01/26/2022 [facility] NEUROIMAG[facility] REPORT STUDY DATE: 01/26/22 PATIENT NAME: Elizabeth Gray DOB: 10-31-1940 MRN: [id number] ORDERING CLINICIAN: Penni Bombard, MD CLINICAL HISTORY: 81 year old female with loss of consciousness. EXAM: MR BRAIN W WO CONTRAST TECHNIQUE: MRI of the brain with and without contrast was obtained utilizing   multiplanar, multiecho pulse sequences. CONTRAST: 66m multihance COMPARISON: 10/10/2014 MRI IMAGING SITE: [facility] FINDINGS: No abnormal lesions are seen on diffusion-weighted views to suggest acute ischemia. The cortical sulci, fissures and cisterns are normal in size and appearance.  Lateral, third and fourth ventricle are normal in size and appearance. No extra-axial fluid collections are seen. No evidence of mass effect or midline shift.  No abnormal lesions are seen on post contrast views.  On sagittal views the posterior fossa, pituitary gland and corpus callosum are unremarkable. No evidence of intracranial hemorrhage on gradient-echo views. The orbits and their contents, paranasal sinuses and calvarium are notable for mucus thickening in the ethmoid sinuses.  Intracranial flow voids are present.   Unremarkable MRI brain with without contrast.  No acute findings. INTERPRETING PHYSICIAN: Penni Bombard, MD Certified in Neurology, Neurophysiology and Neuroimaging [facility] [address], [place], [place] [address] [phone]    Assessment & Plan:   Problem List Items Addressed This Visit     Anemia    Check CBC      B12 deficiency    On B12      Hypothyroidism    Chronic  Cont on Levothroid Check TSH      Insomnia    Chronic w/RLS - worse Cont on Zolpidem   Potential benefits of a long term benzodiazepines  use as well as potential risks  and complications were explained to the patient and were aknowledged.  We increased Mirapex          Meds ordered this encounter  Medications   pramipexole (MIRAPEX) 1 MG tablet    Sig: Take 1-1.5 tablets (1-1.5 mg total) by mouth at bedtime.    Dispense:  135 tablet    Refill:  3      Follow-up: Return in about 3 months (around 08/24/2022) for a follow-up visit.  Walker Kehr, MD

## 2022-05-24 NOTE — Assessment & Plan Note (Signed)
On B12 

## 2022-05-24 NOTE — Assessment & Plan Note (Signed)
Check CBC 

## 2022-05-24 NOTE — Assessment & Plan Note (Signed)
Chronic w/RLS - worse Cont on Zolpidem   Potential benefits of a long term benzodiazepines  use as well as potential risks  and complications were explained to the patient and were aknowledged.  We increased Mirapex

## 2022-05-24 NOTE — Assessment & Plan Note (Signed)
Chronic  Cont on Levothroid Check TSH

## 2022-05-26 ENCOUNTER — Encounter: Payer: Self-pay | Admitting: Internal Medicine

## 2022-05-26 DIAGNOSIS — M9904 Segmental and somatic dysfunction of sacral region: Secondary | ICD-10-CM | POA: Diagnosis not present

## 2022-05-26 DIAGNOSIS — M9903 Segmental and somatic dysfunction of lumbar region: Secondary | ICD-10-CM | POA: Diagnosis not present

## 2022-05-26 DIAGNOSIS — M9905 Segmental and somatic dysfunction of pelvic region: Secondary | ICD-10-CM | POA: Diagnosis not present

## 2022-05-26 DIAGNOSIS — M5431 Sciatica, right side: Secondary | ICD-10-CM | POA: Diagnosis not present

## 2022-05-30 ENCOUNTER — Ambulatory Visit: Payer: Medicare Other | Admitting: Internal Medicine

## 2022-06-01 DIAGNOSIS — M9905 Segmental and somatic dysfunction of pelvic region: Secondary | ICD-10-CM | POA: Diagnosis not present

## 2022-06-01 DIAGNOSIS — M9904 Segmental and somatic dysfunction of sacral region: Secondary | ICD-10-CM | POA: Diagnosis not present

## 2022-06-01 DIAGNOSIS — M9903 Segmental and somatic dysfunction of lumbar region: Secondary | ICD-10-CM | POA: Diagnosis not present

## 2022-06-01 DIAGNOSIS — M5431 Sciatica, right side: Secondary | ICD-10-CM | POA: Diagnosis not present

## 2022-06-08 DIAGNOSIS — M9904 Segmental and somatic dysfunction of sacral region: Secondary | ICD-10-CM | POA: Diagnosis not present

## 2022-06-08 DIAGNOSIS — M5431 Sciatica, right side: Secondary | ICD-10-CM | POA: Diagnosis not present

## 2022-06-08 DIAGNOSIS — M9903 Segmental and somatic dysfunction of lumbar region: Secondary | ICD-10-CM | POA: Diagnosis not present

## 2022-06-08 DIAGNOSIS — M9905 Segmental and somatic dysfunction of pelvic region: Secondary | ICD-10-CM | POA: Diagnosis not present

## 2022-06-15 DIAGNOSIS — M5431 Sciatica, right side: Secondary | ICD-10-CM | POA: Diagnosis not present

## 2022-06-15 DIAGNOSIS — M9903 Segmental and somatic dysfunction of lumbar region: Secondary | ICD-10-CM | POA: Diagnosis not present

## 2022-06-15 DIAGNOSIS — M9904 Segmental and somatic dysfunction of sacral region: Secondary | ICD-10-CM | POA: Diagnosis not present

## 2022-06-15 DIAGNOSIS — M9905 Segmental and somatic dysfunction of pelvic region: Secondary | ICD-10-CM | POA: Diagnosis not present

## 2022-06-17 ENCOUNTER — Encounter: Payer: Self-pay | Admitting: Internal Medicine

## 2022-06-17 ENCOUNTER — Ambulatory Visit (AMBULATORY_SURGERY_CENTER): Payer: Medicare Other | Admitting: Internal Medicine

## 2022-06-17 VITALS — BP 116/53 | HR 62 | Temp 97.1°F | Resp 16 | Ht 63.0 in | Wt 157.0 lb

## 2022-06-17 DIAGNOSIS — K3189 Other diseases of stomach and duodenum: Secondary | ICD-10-CM | POA: Diagnosis not present

## 2022-06-17 DIAGNOSIS — K31A0 Gastric intestinal metaplasia, unspecified: Secondary | ICD-10-CM

## 2022-06-17 DIAGNOSIS — K219 Gastro-esophageal reflux disease without esophagitis: Secondary | ICD-10-CM | POA: Diagnosis not present

## 2022-06-17 DIAGNOSIS — K2950 Unspecified chronic gastritis without bleeding: Secondary | ICD-10-CM | POA: Diagnosis not present

## 2022-06-17 DIAGNOSIS — R1310 Dysphagia, unspecified: Secondary | ICD-10-CM | POA: Diagnosis not present

## 2022-06-17 MED ORDER — SODIUM CHLORIDE 0.9 % IV SOLN: 500.0000 mL | Freq: Once | INTRAVENOUS | Status: DC

## 2022-06-17 NOTE — Op Note (Signed)
[place] Patient Name: Elizabeth Gray Procedure Date: 06/17/2022 9:23 AM MRN: [id number] Endoscopist: Gatha Mayer , MD, [id number] Age: 81 Referring MD:  Date of Birth: March 30, 1941 Gender: Female Account #: [id number] Procedure:                Upper GI endoscopy Indications:              Dysphagia Medicines:                Monitored Anesthesia Care Procedure:                Pre-Anesthesia Assessment:                           - Prior to the procedure, a History and Physical                            was performed, and patient medications and                            allergies were reviewed. The patient's tolerance of                            previous anesthesia was also reviewed. The risks                            and benefits of the procedure and the sedation                            options and risks were discussed with the patient.                            All questions were answered, and informed consent                            was obtained. Prior Anticoagulants: The patient has                            taken no anticoagulant or antiplatelet agents. ASA                            Grade Assessment: II - A patient with mild systemic                            disease. After reviewing the risks and benefits,                            the patient was deemed in satisfactory condition to                            undergo the procedure.                           After obtaining informed consent, the endoscope was  passed under direct vision. Throughout the                            procedure, the patient's blood pressure, pulse, and                            oxygen saturations were monitored continuously. The                            GIF [id number] #[id number] was introduced through the                            mouth, and advanced to the second part of duodenum.                            The upper GI endoscopy was accomplished  without                            difficulty. The patient tolerated the procedure                            well. Scope In: Scope Out: Findings:                 The examined esophagus was moderately tortuous. A                            TTS dilator was passed through the scope. Dilation                            with an 18-19-20 mm balloon dilator was performed                            to 20 mm. The dilation site was examined and showed                            no change. Estimated blood loss: none.                           Diffuse mucosal changes were found in the entire                            examined stomach. Biopsies were taken with a cold                            forceps for histology. Verification of patient                            identification for the specimen was done. Estimated                            blood loss was minimal.  The cardia and gastric fundus were normal on                            retroflexion.                           The examined duodenum was normal. Complications:            No immediate complications. Estimated Blood Loss:     Estimated blood loss was minimal. Impression:               - Tortuous esophagus. Dilated. 20 mm balloon at                            distal esophagus and w/drawn retrograde to proximal                            esophagus - has helped in past for dysmotility                            dysphagia                           - Mucosal changes in the stomach. Biopsied. Looks                            like intestinal metaplasia in antrum, especially                            pre-pyloric + diffuse erythema. Pre-pyloric, antral                            lesser and greater curve + incisura, body + fundus                            lesser and greater curve biopsies (Mapping)                           - Normal examined duodenum. Recommendation:           - Patient has a contact number available  for                            emergencies. The signs and symptoms of potential                            delayed complications were discussed with the                            patient. Return to normal activities tomorrow.                            Written discharge instructions were provided to the                            patient.                           -  Clear liquids x 1 hour then soft foods rest of                            day. Start prior diet tomorrow.                           - Continue present medications.                           - Await pathology results. Gatha Mayer, MD 06/17/2022 9:54:49 AM This report has been signed electronically.

## 2022-06-17 NOTE — Progress Notes (Signed)
Pt's states no medical or surgical changes since previsit or office visit. VS assessed by D.T 

## 2022-06-17 NOTE — Patient Instructions (Addendum)
The stomach lining looked abnormal - red and white color changes. Often non-specific inflammation but sometimes could be pre-cancerous changes that might need monitoring so I took biopsies. I will let you know results and plans.  I dilated the esophagus as we have done before. I hope that helps you.  Please resume your medications and follow the diet instructions we provided.  I appreciate the opportunity to care for you. Gatha Mayer, MD, [facility]  Post dilation diet hand out provided   YOU HAD AN ENDOSCOPIC PROCEDURE TODAY AT [facility]:   Refer to the procedure report that was given to you for any specific questions about what was found during the examination.  If the procedure report does not answer your questions, please call your gastroenterologist to clarify.  If you requested that your care partner not be given the details of your procedure findings, then the procedure report has been included in a sealed envelope for you to review at your convenience later.  YOU SHOULD EXPECT: Some feelings of bloating in the abdomen. Passage of more gas than usual.  Walking can help get rid of the air that was put into your GI tract during the procedure and reduce the bloating. If you had a lower endoscopy (such as a colonoscopy or flexible sigmoidoscopy) you may notice spotting of blood in your stool or on the toilet paper. If you underwent a bowel prep for your procedure, you may not have a normal bowel movement for a few days.  Please Note:  You might notice some irritation and congestion in your nose or some drainage.  This is from the oxygen used during your procedure.  There is no need for concern and it should clear up in a day or so.  SYMPTOMS TO REPORT IMMEDIATELY:  Following upper endoscopy (EGD)  Vomiting of blood or coffee ground material  New chest pain or pain under the shoulder blades  Painful or persistently difficult swallowing  New shortness of breath  Fever of  100F or higher  Black, tarry-looking stools  For urgent or emergent issues, a gastroenterologist can be reached at any hour by calling [phone]. Do not use MyChart messaging for urgent concerns.    DIET:   Clear liquid diet for one hour, until 1045. Soft diet starting at 1045  until tomorrow. See post dilation diet handout for recommendations.  .  Drink plenty of fluids but you should avoid alcoholic beverages for 24 hours   Drink plenty of fluids but you should avoid alcoholic beverages for 24 hours.  ACTIVITY:  You should plan to take it easy for the rest of today and you should NOT DRIVE or use heavy machinery until tomorrow (because of the sedation medicines used during the test).    FOLLOW UP: Our staff will call the number listed on your records the next business day following your procedure.  We will call around 7:15- 8:00 am to check on you and address any questions or concerns that you may have regarding the information given to you following your procedure. If we do not reach you, we will leave a message.     If any biopsies were taken you will be contacted by phone or by letter within the next 1-3 weeks.  Please call us at [phone] if you have not heard about the biopsies in 3 weeks.    SIGNATURES/CONFIDENTIALITY: You and/or your care partner have signed paperwork which will be entered into your electronic medical  record.  These signatures attest to the fact that that the information above on your After Visit Summary has been reviewed and is understood.  Full responsibility of the confidentiality of this discharge information lies with you and/or your care-partner.

## 2022-06-17 NOTE — Progress Notes (Signed)
History and Physical Interval Note:  06/17/2022 9:21 AM  Elizabeth Gray  has presented today for endoscopic procedure(s), with the diagnosis of  Encounter Diagnoses  Name Primary?   Dysphagia, unspecified type Yes   Chronic GERD   .  The various methods of evaluation and treatment have been discussed with the patient and/or family. After consideration of risks, benefits and other options for treatment, the patient has consented to  the endoscopic procedure(s).   The patient's history has been reviewed, patient examined, no change in status, stable for endoscopic procedure(s).  I have reviewed the patient's chart and labs.  Questions were answered to the patient's satisfaction.     Gatha Mayer, MD, [facility]

## 2022-06-17 NOTE — Progress Notes (Signed)
Called to room to assist during endoscopic procedure.  Patient ID and intended procedure confirmed with present staff. Received instructions for my participation in the procedure from the performing physician.  

## 2022-06-17 NOTE — Progress Notes (Signed)
A and O x3. Report to RN. Tolerated MAC anesthesia well.Teeth unchanged after procedure. 

## 2022-06-20 ENCOUNTER — Telehealth: Payer: Self-pay

## 2022-06-20 NOTE — Telephone Encounter (Signed)
  Follow up Call-     06/17/2022    8:19 AM  Call back number  Post procedure Call Back phone  # [phone]  Permission to leave phone message Yes     Patient questions:  Do you have a fever, pain , or abdominal swelling? No. Pain Score  0 *  Have you tolerated food without any problems? Yes.    Have you been able to return to your normal activities? Yes.    Do you have any questions about your discharge instructions: Diet   No. Medications  No. Follow up visit  No.  Do you have questions or concerns about your Care? No.  Actions: * If pain score is 4 or above: No action needed, pain <4.

## 2022-06-21 ENCOUNTER — Other Ambulatory Visit: Payer: Self-pay | Admitting: Internal Medicine

## 2022-06-22 DIAGNOSIS — M9903 Segmental and somatic dysfunction of lumbar region: Secondary | ICD-10-CM | POA: Diagnosis not present

## 2022-06-22 DIAGNOSIS — M9904 Segmental and somatic dysfunction of sacral region: Secondary | ICD-10-CM | POA: Diagnosis not present

## 2022-06-22 DIAGNOSIS — M5431 Sciatica, right side: Secondary | ICD-10-CM | POA: Diagnosis not present

## 2022-06-22 DIAGNOSIS — M9905 Segmental and somatic dysfunction of pelvic region: Secondary | ICD-10-CM | POA: Diagnosis not present

## 2022-06-25 ENCOUNTER — Other Ambulatory Visit: Payer: Self-pay | Admitting: Internal Medicine

## 2022-06-27 ENCOUNTER — Encounter: Payer: Self-pay | Admitting: Internal Medicine

## 2022-07-06 DIAGNOSIS — M9904 Segmental and somatic dysfunction of sacral region: Secondary | ICD-10-CM | POA: Diagnosis not present

## 2022-07-06 DIAGNOSIS — M9903 Segmental and somatic dysfunction of lumbar region: Secondary | ICD-10-CM | POA: Diagnosis not present

## 2022-07-06 DIAGNOSIS — M9905 Segmental and somatic dysfunction of pelvic region: Secondary | ICD-10-CM | POA: Diagnosis not present

## 2022-07-06 DIAGNOSIS — M5431 Sciatica, right side: Secondary | ICD-10-CM | POA: Diagnosis not present

## 2022-07-11 DIAGNOSIS — R0981 Nasal congestion: Secondary | ICD-10-CM | POA: Diagnosis not present

## 2022-07-11 DIAGNOSIS — J329 Chronic sinusitis, unspecified: Secondary | ICD-10-CM | POA: Diagnosis not present

## 2022-07-11 DIAGNOSIS — R509 Fever, unspecified: Secondary | ICD-10-CM | POA: Diagnosis not present

## 2022-07-13 DIAGNOSIS — M9904 Segmental and somatic dysfunction of sacral region: Secondary | ICD-10-CM | POA: Diagnosis not present

## 2022-07-13 DIAGNOSIS — M9903 Segmental and somatic dysfunction of lumbar region: Secondary | ICD-10-CM | POA: Diagnosis not present

## 2022-07-13 DIAGNOSIS — M9905 Segmental and somatic dysfunction of pelvic region: Secondary | ICD-10-CM | POA: Diagnosis not present

## 2022-07-13 DIAGNOSIS — M5431 Sciatica, right side: Secondary | ICD-10-CM | POA: Diagnosis not present

## 2022-07-20 DIAGNOSIS — M9903 Segmental and somatic dysfunction of lumbar region: Secondary | ICD-10-CM | POA: Diagnosis not present

## 2022-07-20 DIAGNOSIS — M9904 Segmental and somatic dysfunction of sacral region: Secondary | ICD-10-CM | POA: Diagnosis not present

## 2022-07-20 DIAGNOSIS — M9905 Segmental and somatic dysfunction of pelvic region: Secondary | ICD-10-CM | POA: Diagnosis not present

## 2022-07-20 DIAGNOSIS — M5431 Sciatica, right side: Secondary | ICD-10-CM | POA: Diagnosis not present

## 2022-07-21 ENCOUNTER — Other Ambulatory Visit: Payer: Self-pay | Admitting: Internal Medicine

## 2022-07-28 DIAGNOSIS — M9904 Segmental and somatic dysfunction of sacral region: Secondary | ICD-10-CM | POA: Diagnosis not present

## 2022-07-28 DIAGNOSIS — M9903 Segmental and somatic dysfunction of lumbar region: Secondary | ICD-10-CM | POA: Diagnosis not present

## 2022-07-28 DIAGNOSIS — M5431 Sciatica, right side: Secondary | ICD-10-CM | POA: Diagnosis not present

## 2022-07-28 DIAGNOSIS — M9905 Segmental and somatic dysfunction of pelvic region: Secondary | ICD-10-CM | POA: Diagnosis not present

## 2022-08-02 ENCOUNTER — Other Ambulatory Visit: Payer: Self-pay | Admitting: Internal Medicine

## 2022-08-04 DIAGNOSIS — M9903 Segmental and somatic dysfunction of lumbar region: Secondary | ICD-10-CM | POA: Diagnosis not present

## 2022-08-04 DIAGNOSIS — M9904 Segmental and somatic dysfunction of sacral region: Secondary | ICD-10-CM | POA: Diagnosis not present

## 2022-08-04 DIAGNOSIS — M9905 Segmental and somatic dysfunction of pelvic region: Secondary | ICD-10-CM | POA: Diagnosis not present

## 2022-08-04 DIAGNOSIS — M5431 Sciatica, right side: Secondary | ICD-10-CM | POA: Diagnosis not present

## 2022-08-11 DIAGNOSIS — M9905 Segmental and somatic dysfunction of pelvic region: Secondary | ICD-10-CM | POA: Diagnosis not present

## 2022-08-11 DIAGNOSIS — M5431 Sciatica, right side: Secondary | ICD-10-CM | POA: Diagnosis not present

## 2022-08-11 DIAGNOSIS — M9903 Segmental and somatic dysfunction of lumbar region: Secondary | ICD-10-CM | POA: Diagnosis not present

## 2022-08-11 DIAGNOSIS — M9904 Segmental and somatic dysfunction of sacral region: Secondary | ICD-10-CM | POA: Diagnosis not present

## 2022-08-18 ENCOUNTER — Other Ambulatory Visit: Payer: Self-pay | Admitting: Internal Medicine

## 2022-08-18 DIAGNOSIS — M5431 Sciatica, right side: Secondary | ICD-10-CM | POA: Diagnosis not present

## 2022-08-18 DIAGNOSIS — M9903 Segmental and somatic dysfunction of lumbar region: Secondary | ICD-10-CM | POA: Diagnosis not present

## 2022-08-18 DIAGNOSIS — M9905 Segmental and somatic dysfunction of pelvic region: Secondary | ICD-10-CM | POA: Diagnosis not present

## 2022-08-18 DIAGNOSIS — M9904 Segmental and somatic dysfunction of sacral region: Secondary | ICD-10-CM | POA: Diagnosis not present

## 2022-08-29 ENCOUNTER — Ambulatory Visit (INDEPENDENT_AMBULATORY_CARE_PROVIDER_SITE_OTHER): Payer: Medicare Other | Admitting: Internal Medicine

## 2022-08-29 ENCOUNTER — Encounter: Payer: Self-pay | Admitting: Internal Medicine

## 2022-08-29 VITALS — BP 124/78 | HR 73 | Temp 98.2°F | Ht 63.0 in | Wt 149.0 lb

## 2022-08-29 DIAGNOSIS — R609 Edema, unspecified: Secondary | ICD-10-CM | POA: Diagnosis not present

## 2022-08-29 DIAGNOSIS — R0789 Other chest pain: Secondary | ICD-10-CM

## 2022-08-29 DIAGNOSIS — I2510 Atherosclerotic heart disease of native coronary artery without angina pectoris: Secondary | ICD-10-CM

## 2022-08-29 DIAGNOSIS — I2583 Coronary atherosclerosis due to lipid rich plaque: Secondary | ICD-10-CM | POA: Diagnosis not present

## 2022-08-29 DIAGNOSIS — G2581 Restless legs syndrome: Secondary | ICD-10-CM | POA: Diagnosis not present

## 2022-08-29 LAB — COMPREHENSIVE METABOLIC PANEL
ALT: 22 U/L (ref 0–35)
AST: 28 U/L (ref 0–37)
Albumin: 4.7 g/dL (ref 3.5–5.2)
Alkaline Phosphatase: 96 U/L (ref 39–117)
BUN: 26 mg/dL — ABNORMAL HIGH (ref 6–23)
CO2: 33 mEq/L — ABNORMAL HIGH (ref 19–32)
Calcium: 9.8 mg/dL (ref 8.4–10.5)
Chloride: 95 mEq/L — ABNORMAL LOW (ref 96–112)
Creatinine, Ser: 0.99 mg/dL (ref 0.40–1.20)
GFR: 52.63 mL/min — ABNORMAL LOW (ref >60.00)
Glucose, Bld: 126 mg/dL — ABNORMAL HIGH (ref 70–99)
Potassium: 3.7 mEq/L (ref 3.5–5.1)
Sodium: 139 mEq/L (ref 135–145)
Total Bilirubin: 0.4 mg/dL (ref 0.2–1.2)
Total Protein: 8.5 g/dL — ABNORMAL HIGH (ref 6.0–8.3)

## 2022-08-29 LAB — CBC WITH DIFFERENTIAL/PLATELET
Basophils Absolute: 0.1 10*3/uL (ref 0.0–0.1)
Basophils Relative: 0.6 % (ref 0.0–3.0)
Eosinophils Absolute: 0.3 10*3/uL (ref 0.0–0.7)
Eosinophils Relative: 3.1 % (ref 0.0–5.0)
HCT: 41.2 % (ref 36.0–46.0)
Hemoglobin: 14 g/dL (ref 12.0–15.0)
Lymphocytes Relative: 23.4 % (ref 12.0–46.0)
Lymphs Abs: 2.4 10*3/uL (ref 0.7–4.0)
MCHC: 33.8 g/dL (ref 30.0–36.0)
MCV: 81.4 fl (ref 78.0–100.0)
Monocytes Absolute: 0.9 10*3/uL (ref 0.1–1.0)
Monocytes Relative: 8.7 % (ref 3.0–12.0)
Neutro Abs: 6.6 10*3/uL (ref 1.4–7.7)
Neutrophils Relative %: 64.2 % (ref 43.0–77.0)
Platelets: 356 10*3/uL (ref 150.0–400.0)
RBC: 5.06 Mil/uL (ref 3.87–5.11)
RDW: 15.3 % (ref 11.5–15.5)
WBC: 10.3 10*3/uL (ref 4.0–10.5)

## 2022-08-29 LAB — T4, FREE: Free T4: 0.65 ng/dL (ref 0.60–1.60)

## 2022-08-29 LAB — TSH: TSH: 16.71 u[IU]/mL — ABNORMAL HIGH (ref 0.35–5.50)

## 2022-08-29 MED ORDER — FUROSEMIDE 20 MG PO TABS: 20.0000 mg | ORAL_TABLET | Freq: Every day | ORAL | 3 refills | Status: DC | PRN

## 2022-08-29 MED ORDER — PRAMIPEXOLE DIHYDROCHLORIDE 1 MG PO TABS: 2.0000 mg | ORAL_TABLET | Freq: Every day | ORAL | 3 refills | Status: DC

## 2022-08-29 MED ORDER — NITROGLYCERIN 0.4 MG SL SUBL: 0.4000 mg | SUBLINGUAL_TABLET | SUBLINGUAL | 3 refills | Status: DC | PRN

## 2022-08-29 NOTE — Assessment & Plan Note (Signed)
NTG prn Cardiology ref

## 2022-08-29 NOTE — Progress Notes (Signed)
Subjective:  Patient ID: Elizabeth Gray, female    DOB: 18-Dec-1940  Age: 82 y.o. MRN: [id number]  CC: Follow-up (Had some feet swelling from a recent fall and not sleeping well)   HPI Elizabeth Gray presents for a recent fall after Elizabeth Gray tripped on the brick step 1 month ago. No LOC. The head did not toch the serface C/o L foot toes swelling and discoloration - better. No c/o R foot swelling as well x 3 d. C/o occ CP/pressure, last one 1 month ago;GERD (on Zegerid) C/o RLS - worse   Outpatient Medications Prior to Visit  Medication Sig Dispense Refill   Acetaminophen (TYLENOL PO) Take by mouth.     Alum Hydroxide-Mag Carbonate (GAVISCON PO) Take 1 tablet by mouth 3 (three) times daily after meals.     amoxicillin (AMOXIL) 500 MG capsule Take by mouth.     ASPIRIN 81 PO Take by mouth.     Cholecalciferol (VITAMIN D PO) Take 1,000 Units by mouth every other day.      cyanocobalamin (VITAMIN B12) 1000 MCG/ML injection INJECT 1 ML INTRAMUSCULARLY ONCE EVERY 14 DAYS 2 mL 2   Flaxseed, Linseed, (FLAXSEED OIL) 1000 MG CAPS Take 1,000 mg by mouth daily.     Glucosamine-Chondroitin (MOVE FREE PO) Take 1 tablet by mouth in the morning and at bedtime.     levothyroxine (SYNTHROID) 100 MCG tablet Take 1 tablet (100 mcg total) by mouth daily. 90 tablet 3   metoprolol succinate (TOPROL-XL) 25 MG 24 hr tablet Take 0.5 tablets (12.5 mg total) by mouth 2 (two) times daily. 90 tablet 3   Multiple Vitamins-Minerals (ICAPS AREDS FORMULA PO) Take 1 capsule by mouth in the morning and at bedtime.     omeprazole-sodium bicarbonate (ZEGERID) 40-1100 MG capsule TAKE 1 CAPSULE BY MOUTH ONCE DAILY BEFORE BREAKFAST 90 capsule 3   Polyethyl Glycol-Propyl Glycol 0.4-0.3 % SOLN Place 2-3 drops into both eyes 2 (two) times daily as needed (dry eyes).     pravastatin (PRAVACHOL) 20 MG tablet Take 1 tablet (20 mg total) by mouth daily. 90 tablet 3   SYRINGE-NEEDLE, DISP, 3 ML (B-D 3CC LUER-LOK SYR 25GX1") 25G X 1"  3 ML MISC USE AS DIRECTED FOR B12 INJECTIONS 2 each 5   traMADol (ULTRAM) 50 MG tablet Take 1-2 tablets (50-100 mg total) by mouth every 6 (six) hours as needed for moderate pain or severe pain. 40 tablet 0   zolpidem (AMBIEN) 10 MG tablet TAKE 1 TABLET BY MOUTH AT BEDTIME AS NEEDED FOR SLEEP 90 tablet 1   pramipexole (MIRAPEX) 1 MG tablet Take 1-1.5 tablets (1-1.5 mg total) by mouth at bedtime. 135 tablet 3   No facility-administered medications prior to visit.    ROS: Review of Systems  Constitutional:  Negative for activity change, appetite change, chills, fatigue and unexpected weight change.  HENT:  Negative for congestion, mouth sores and sinus pressure.   Eyes:  Negative for visual disturbance.  Respiratory:  Positive for chest tightness. Negative for cough.   Cardiovascular:  Positive for palpitations and leg swelling.  Gastrointestinal:  Negative for abdominal pain and nausea.  Genitourinary:  Negative for difficulty urinating, frequency and vaginal pain.  Musculoskeletal:  Negative for back pain and gait problem.  Skin:  Negative for pallor and rash.  Neurological:  Negative for dizziness, tremors, weakness, numbness and headaches.  Psychiatric/Behavioral:  Negative for confusion and sleep disturbance.     Objective:  BP 124/78 (BP Location: Right Arm, Patient  Position: Sitting, Cuff Size: Normal)   Pulse 73   Temp 98.2 F (36.8 C) (Oral)   Ht '5\' 3"'$  (1.6 m)   Wt 149 lb (67.6 kg)   SpO2 98%   BMI 26.39 kg/m   BP Readings from Last 3 Encounters:  08/29/22 124/78  06/17/22 (!) 116/53  05/24/22 130/62    Wt Readings from Last 3 Encounters:  08/29/22 149 lb (67.6 kg)  06/17/22 157 lb (71.2 kg)  05/24/22 155 lb 9.6 oz (70.6 kg)    Physical Exam Constitutional:      General: Elizabeth Gray is not in acute distress.    Appearance: Normal appearance. Elizabeth Gray is well-developed.  HENT:     Head: Normocephalic.     Right Ear: External ear normal.     Left Ear: External ear normal.      Nose: Nose normal.  Eyes:     General:        Right eye: No discharge.        Left eye: No discharge.     Conjunctiva/sclera: Conjunctivae normal.     Pupils: Pupils are equal, round, and reactive to light.  Neck:     Thyroid: No thyromegaly.     Vascular: No JVD.     Trachea: No tracheal deviation.  Cardiovascular:     Rate and Rhythm: Normal rate and regular rhythm.     Heart sounds: Normal heart sounds.  Pulmonary:     Effort: No respiratory distress.     Breath sounds: No stridor. No wheezing.  Abdominal:     General: Bowel sounds are normal. There is no distension.     Palpations: Abdomen is soft. There is no mass.     Tenderness: There is no abdominal tenderness. There is no guarding or rebound.  Musculoskeletal:        General: No tenderness.     Cervical back: Normal range of motion and neck supple. No rigidity.  Lymphadenopathy:     Cervical: No cervical adenopathy.  Skin:    Findings: No erythema or rash.  Neurological:     Cranial Nerves: No cranial nerve deficit.     Motor: No abnormal muscle tone.     Coordination: Coordination normal.     Deep Tendon Reflexes: Reflexes normal.  Psychiatric:        Behavior: Behavior normal.        Thought Content: Thought content normal.        Judgment: Judgment normal.   No edema    A total time of 45 minutes was spent preparing to see the patient, reviewing tests, x-rays, operative reports and other medical records.  Also, obtaining history and performing comprehensive physical exam.  Additionally, counseling the patient regarding the above listed issues.   Finally, documenting clinical information in the health records, coordination of care, educating the patient re: RLS, CP   Lab Results  Component Value Date   WBC 7.6 05/24/2022   HGB 12.1 05/24/2022   HCT 36.9 05/24/2022   PLT 303.0 05/24/2022   GLUCOSE 102 (H) 05/24/2022   CHOL 146 11/22/2021   TRIG 91.0 11/22/2021   HDL 65.60 11/22/2021   LDLCALC 63  11/22/2021   ALT 18 05/24/2022   AST 26 05/24/2022   NA 141 05/24/2022   K 4.1 05/24/2022   CL 103 05/24/2022   CREATININE 0.67 05/24/2022   BUN 21 05/24/2022   CO2 30 05/24/2022   TSH 2.65 05/24/2022   INR 3.03 (H) 05/12/2014  MR BRAIN W WO CONTRAST  Result Date: 01/26/2022 [facility] NEUROIMAG[facility] REPORT STUDY DATE: 01/26/22 PATIENT NAME: DAISIA SLOMSKI DOB: 1940-10-27 MRN: [id number] ORDERING CLINICIAN: Penni Bombard, MD CLINICAL HISTORY: 82 year old female with loss of consciousness. EXAM: MR BRAIN W WO CONTRAST TECHNIQUE: MRI of the brain with and without contrast was obtained utilizing  multiplanar, multiecho pulse sequences. CONTRAST: 2m multihance COMPARISON: 10/10/2014 MRI IMAGING SITE: [facility] [place] FINDINGS: No abnormal lesions are seen on diffusion-weighted views to suggest acute ischemia. The cortical sulci, fissures and cisterns are normal in size and appearance. Lateral, third and fourth ventricle are normal in size and appearance. No extra-axial fluid collections are seen. No evidence of mass effect or midline shift.  No abnormal lesions are seen on post contrast views.  On sagittal views the posterior fossa, pituitary gland and corpus callosum are unremarkable. No evidence of intracranial hemorrhage on gradient-echo views. The orbits and their contents, paranasal sinuses and calvarium are notable for mucus thickening in the ethmoid sinuses.  Intracranial flow voids are present.   Unremarkable MRI brain with without contrast.  No acute findings. INTERPRETING PHYSICIAN: VPenni Bombard MD Certified in Neurology, Neurophysiology and Neuroimaging [facility] [address] [place] [id number]([phone]   Assessment & Plan:   Problem List Items Addressed This Visit       Cardiovascular and Mediastinum   Coronary atherosclerosis    On Pravastatin, ASA F/u  w/Cardiology      Relevant Medications   nitroGLYCERIN (NITROSTAT) 0.4 MG SL tablet   furosemide (LASIX) 20 MG tablet   Other Relevant Orders   Ambulatory referral to Cardiology     Other   Chest pain, atypical    NTG prn Cardiology ref      Relevant Orders   CBC with Differential/Platelet   Comprehensive metabolic panel   T4, free   TSH   ECHOCARDIOGRAM COMPLETE   Ambulatory referral to Cardiology   Edema - Primary    New Furosemide prn NAS diet Check TSH Ordered 2D ECHO Cardiology ref      Relevant Orders   CBC with Differential/Platelet   Comprehensive metabolic panel   T4, free   TSH   ECHOCARDIOGRAM COMPLETE   Ambulatory referral to Cardiology   RLS (restless legs syndrome)    Worse Will increase Mirapex         Meds ordered this encounter  Medications   pramipexole (MIRAPEX) 1 MG tablet    Sig: Take 2 tablets (2 mg total) by mouth at bedtime.    Dispense:  180 tablet    Refill:  3   nitroGLYCERIN (NITROSTAT) 0.4 MG SL tablet    Sig: Place 1 tablet (0.4 mg total) under the tongue every 5 (five) minutes as needed for chest pain.    Dispense:  20 tablet    Refill:  3   furosemide (LASIX) 20 MG tablet    Sig: Take 1 tablet (20 mg total) by mouth daily as needed.    Dispense:  30 tablet    Refill:  3      Follow-up: No follow-ups on file.  AWalker Kehr MD

## 2022-08-29 NOTE — Assessment & Plan Note (Addendum)
New Furosemide prn NAS diet Check TSH Ordered 2D ECHO Cardiology ref

## 2022-08-29 NOTE — Assessment & Plan Note (Signed)
Worse Will increase Mirapex

## 2022-08-29 NOTE — Assessment & Plan Note (Signed)
On Pravastatin, ASA F/u w/Cardiology

## 2022-09-01 DIAGNOSIS — M5431 Sciatica, right side: Secondary | ICD-10-CM | POA: Diagnosis not present

## 2022-09-01 DIAGNOSIS — M9904 Segmental and somatic dysfunction of sacral region: Secondary | ICD-10-CM | POA: Diagnosis not present

## 2022-09-01 DIAGNOSIS — M9903 Segmental and somatic dysfunction of lumbar region: Secondary | ICD-10-CM | POA: Diagnosis not present

## 2022-09-01 DIAGNOSIS — M9905 Segmental and somatic dysfunction of pelvic region: Secondary | ICD-10-CM | POA: Diagnosis not present

## 2022-09-08 DIAGNOSIS — M9905 Segmental and somatic dysfunction of pelvic region: Secondary | ICD-10-CM | POA: Diagnosis not present

## 2022-09-08 DIAGNOSIS — M5431 Sciatica, right side: Secondary | ICD-10-CM | POA: Diagnosis not present

## 2022-09-08 DIAGNOSIS — M9904 Segmental and somatic dysfunction of sacral region: Secondary | ICD-10-CM | POA: Diagnosis not present

## 2022-09-08 DIAGNOSIS — M9903 Segmental and somatic dysfunction of lumbar region: Secondary | ICD-10-CM | POA: Diagnosis not present

## 2022-09-15 ENCOUNTER — Encounter: Payer: Self-pay | Admitting: Internal Medicine

## 2022-09-15 DIAGNOSIS — M9904 Segmental and somatic dysfunction of sacral region: Secondary | ICD-10-CM | POA: Diagnosis not present

## 2022-09-15 DIAGNOSIS — M5431 Sciatica, right side: Secondary | ICD-10-CM | POA: Diagnosis not present

## 2022-09-15 DIAGNOSIS — M9905 Segmental and somatic dysfunction of pelvic region: Secondary | ICD-10-CM | POA: Diagnosis not present

## 2022-09-15 DIAGNOSIS — M9903 Segmental and somatic dysfunction of lumbar region: Secondary | ICD-10-CM | POA: Diagnosis not present

## 2022-09-19 ENCOUNTER — Ambulatory Visit (HOSPITAL_COMMUNITY): Payer: Medicare Other | Attending: Internal Medicine

## 2022-09-19 DIAGNOSIS — R0789 Other chest pain: Secondary | ICD-10-CM | POA: Diagnosis not present

## 2022-09-19 DIAGNOSIS — R609 Edema, unspecified: Secondary | ICD-10-CM | POA: Insufficient documentation

## 2022-09-19 LAB — ECHOCARDIOGRAM COMPLETE
Area-P 1/2: 3.1 cm2
S' Lateral: 1.8 cm

## 2022-09-27 ENCOUNTER — Other Ambulatory Visit: Payer: Self-pay | Admitting: Internal Medicine

## 2022-09-29 DIAGNOSIS — M9903 Segmental and somatic dysfunction of lumbar region: Secondary | ICD-10-CM | POA: Diagnosis not present

## 2022-09-29 DIAGNOSIS — M9905 Segmental and somatic dysfunction of pelvic region: Secondary | ICD-10-CM | POA: Diagnosis not present

## 2022-09-29 DIAGNOSIS — M5431 Sciatica, right side: Secondary | ICD-10-CM | POA: Diagnosis not present

## 2022-09-29 DIAGNOSIS — M9904 Segmental and somatic dysfunction of sacral region: Secondary | ICD-10-CM | POA: Diagnosis not present

## 2022-10-13 DIAGNOSIS — M9905 Segmental and somatic dysfunction of pelvic region: Secondary | ICD-10-CM | POA: Diagnosis not present

## 2022-10-13 DIAGNOSIS — M5431 Sciatica, right side: Secondary | ICD-10-CM | POA: Diagnosis not present

## 2022-10-13 DIAGNOSIS — M9903 Segmental and somatic dysfunction of lumbar region: Secondary | ICD-10-CM | POA: Diagnosis not present

## 2022-10-13 DIAGNOSIS — M9904 Segmental and somatic dysfunction of sacral region: Secondary | ICD-10-CM | POA: Diagnosis not present

## 2022-10-19 DIAGNOSIS — I700 Atherosclerosis of aorta: Secondary | ICD-10-CM | POA: Insufficient documentation

## 2022-10-19 DIAGNOSIS — R931 Abnormal findings on diagnostic imaging of heart and coronary circulation: Secondary | ICD-10-CM | POA: Insufficient documentation

## 2022-10-19 NOTE — Progress Notes (Signed)
Cardiology Office Note   Date:  10/21/2022   ID:  Ryenne, Clifford 1940-10-04, MRN [id number]  PCP:  Cassandria Anger, MD  Cardiologist:   None Referring:  Plotnikov, Evie Lacks, MD   Chief Complaint  Patient presents with   Loss of Consciousness   Chest Pain     History of Present Illness: Elizabeth Gray is a 82 y.o. female who presents for evaluation of palpitations.  She was referred by Plotnikov, Evie Lacks, MD. she had a work-up for syncope.  This happened in April 2023.  Since that time she has had no further syncope.  Her palpitations been well-controlled on low-dose beta-blocker.  She did have 1 episode of chest discomfort sometime ago.  This was a substernal discomfort.  It came and went spontaneously.  She has not had this in several weeks.  She has 4 part-time jobs.  She stays physically active with this and does not bring on any symptoms.  Is not having any new shortness of breath, PND or orthopnea.  She is not having any palpitations, presyncope or syncope.  She has had some weight gain because she has been eating too much.  She has had some very mild ankle edema.  She did have coronary calcium with a score of 289 on CT in 2020.   She had a low risk perfusion study in 2017.  Echo in in 2024 February demonstrated an EF of 70 to 75%.  There were no significant valvular abnormalities..       Past Medical History:  Diagnosis Date   Allergy    Bursitis    Cancer ([place]) 03/22/2017   Melanoma in situ, lentigo maligna type   Deviated septum    External hemorrhoids without mention of complication [id number]   Colonoscopy    Fatigue    GERD (gastroesophageal reflux disease)    Headache(784.0)    migraines   Heat rash    under the breasts.Marland Kitchenappeared on monday.Marland KitchenMarland KitchenBurning & itching, uses cortisone   Hypothyroidism    Iron deficiency anemia, unspecified    Lower esophageal ring 2008   EGD   Nonorganic sleep disorder, unspecified    Osteoarthritis    Osteoporosis     Pancreatitis    Personal history of colonic polyps    Polio 1948   Pulmonary sarcoidosis ([place])    Sleep apnea    [facility]   Vitamin B12 deficiency    Vitamin D deficiency     Past Surgical History:  Procedure Laterality Date   [place]  2002   rt   CHOLECYSTECTOMY  2000   COLONOSCOPY  12/19/2006   Multiple diminutive polyps destroyed-removed (hyperpastic), internal hemorrhoids   ESOPHAGOGASTRODUODENOSCOPY  12/19/2006   lower esophageal ring dilated   HAND SURGERY  1997   left   KNEE ARTHROSCOPY Bilateral    multiple 2 on lft 1 on rt   [place]   Melanoma Removal  Right 2018   right face   ROTATOR CUFF REPAIR Left    Nov 29, 2021   TOE SURGERY Left    left foot next to little toe  joint rem   TOTAL KNEE ARTHROPLASTY Right 05/09/2014   Procedure: RIGHT TOTAL KNEE ARTHROPLASTY;  Surgeon: [name], MD;  Location: [facility];  Service: Orthopedics;  Laterality: Right;   TOTAL KNEE ARTHROPLASTY Left 08/06/2021   Procedure: TOTAL KNEE ARTHROPLASTY;  Surgeon: [name], MD;  Location: [facility];  Service: Orthopedics;  Laterality: Left;   UPPER GASTROINTESTINAL ENDOSCOPY     WISDOM TOOTH EXTRACTION       Current Outpatient Medications  Medication Sig Dispense Refill   Alum Hydroxide-Mag Carbonate (GAVISCON PO) Take 1 tablet by mouth 3 (three) times daily after meals.     ASPIRIN 81 PO Take by mouth.     Cholecalciferol (VITAMIN D PO) Take 1,000 Units by mouth every other day.      cyanocobalamin (VITAMIN B12) 1000 MCG/ML injection INJECT 1 ML INTRAMUSCULARLY ONCE EVERY 14 DAYS 2 mL 2   Flaxseed, Linseed, (FLAXSEED OIL) 1000 MG CAPS Take 1,000 mg by mouth daily.     furosemide (LASIX) 20 MG tablet Take 1 tablet (20 mg total) by mouth daily as needed. 30 tablet 3   Glucosamine-Chondroitin (MOVE FREE PO) Take 1 tablet by mouth in the morning and at bedtime.     levothyroxine (SYNTHROID) 100  MCG tablet Take 1 tablet (100 mcg total) by mouth daily. 90 tablet 3   metoprolol succinate (TOPROL-XL) 25 MG 24 hr tablet Take 0.5 tablets (12.5 mg total) by mouth 2 (two) times daily. 90 tablet 3   Multiple Vitamins-Minerals (ICAPS AREDS FORMULA PO) Take 1 capsule by mouth in the morning and at bedtime.     nitroGLYCERIN (NITROSTAT) 0.4 MG SL tablet Place 1 tablet (0.4 mg total) under the tongue every 5 (five) minutes as needed for chest pain. 20 tablet 3   omeprazole-sodium bicarbonate (ZEGERID) 40-1100 MG capsule TAKE 1 CAPSULE BY MOUTH ONCE DAILY BEFORE BREAKFAST 90 capsule 3   Polyethyl Glycol-Propyl Glycol 0.4-0.3 % SOLN Place 2-3 drops into both eyes 2 (two) times daily as needed (dry eyes).     pramipexole (MIRAPEX) 1 MG tablet Take 2 tablets (2 mg total) by mouth at bedtime. 180 tablet 3   pravastatin (PRAVACHOL) 20 MG tablet Take 1 tablet (20 mg total) by mouth daily. Annual appt due in April must see provider for future refills 90 tablet 0   SYRINGE-NEEDLE, DISP, 3 ML (B-D 3CC LUER-LOK SYR 25GX1") 25G X 1" 3 ML MISC USE AS DIRECTED FOR B12 INJECTIONS 2 each 5   traMADol (ULTRAM) 50 MG tablet Take 1-2 tablets (50-100 mg total) by mouth every 6 (six) hours as needed for moderate pain or severe pain. 40 tablet 0   zolpidem (AMBIEN) 10 MG tablet TAKE 1 TABLET BY MOUTH AT BEDTIME AS NEEDED FOR SLEEP 90 tablet 1   Acetaminophen (TYLENOL PO) Take by mouth.     amoxicillin (AMOXIL) 500 MG capsule Take by mouth.     No current facility-administered medications for this visit.    Allergies:   Dexilant [dexlansoprazole], Dayquil severe + vapocool [phenylephrine-dm-gg-apap], Gabapentin, Metoclopramide hcl, Oxymetazoline hcl, Restasis [cyclosporine], Sucralfate, Acetaminophen, Dm-doxylamine-acetaminophen, and Vicks dayquil hbp cold & flu [acetaminophen-dm]    ROS:  Please see the history of present illness.   Otherwise, review of systems are positive for none.   All other systems are reviewed and  negative.    PHYSICAL EXAM: VS:  BP 138/72   Pulse 70   Ht 5\' 4"  (1.626 m)   Wt 152 lb 6.4 oz (69.1 kg)   SpO2 97%   BMI 26.16 kg/m  , BMI Body mass index is 26.16 kg/m. GENERAL:  Well appearing NECK:  No jugular venous distention, waveform within normal limits, carotid upstroke brisk and symmetric, no bruits, no thyromegaly LUNGS:  Clear to auscultation bilaterally CHEST:  Unremarkable HEART:  PMI not displaced or sustained,S1 and S2 within normal limits, no S3, no S4, no clicks, no rubs, no murmurs ABD:  Flat, positive bowel sounds normal in frequency in pitch, no bruits, no rebound, no guarding, no midline pulsatile mass, no hepatomegaly, no splenomegaly EXT:  2 plus pulses throughout, no edema, no cyanosis no clubbing   EKG:  EKG is  ordered today. The ekg ordered today demonstrates sinus rhythm, rate 72, axis within normal limits,  right bundle branch block, no acute ST-T wave changes.   Recent Labs: 01/24/2022: Magnesium 2.6 08/29/2022: ALT 22; BUN 26; Creatinine, Ser 0.99; Hemoglobin 14.0; Platelets 356.0; Potassium 3.7; Sodium 139; TSH 16.71    Lipid Panel    Component Value Date/Time   CHOL 146 11/22/2021 0828   TRIG 91.0 11/22/2021 0828   HDL 65.60 11/22/2021 0828   CHOLHDL 2 11/22/2021 0828   VLDL 18.2 11/22/2021 0828   LDLCALC 63 11/22/2021 0828   LDLCALC 53 03/23/2020 0834      Wt Readings from Last 3 Encounters:  10/21/22 152 lb 6.4 oz (69.1 kg)  08/29/22 149 lb (67.6 kg)  06/17/22 157 lb (71.2 kg)      Other studies Reviewed: Additional studies/ records that were reviewed today include: Labs Review of the above records demonstrates:  Please see elsewhere in the note.     ASSESSMENT AND PLAN:    CHEST PAIN: The patient has chest discomfort as described.  I think the pretest probability of obstructive coronary disease is low but she does have coronary calcium.  I am going to bring her back for a POET (Plain Old Exercise Treadmill).  She does have  a right bundle branch block but her EKG should be interpretable.  PALPITATIONS:   She had SVT.  She is doing well with current low-dose of beta-blocker.  No change in therapy.   SYNCOPE:    She has had no further syncope.  No further evaluation.  ELEVATED CORONARY CALCIUM:   This was 69th percentile for her age.  I will evaluate with a POET (Plain Old Exercise Treadmill) as above.  AORTIC ATHEROSCLEROSIS:    We are participating with aggressive risk reduction.  No change in therapy.  Current medicines are reviewed at length with the patient today.  The patient does not have concerns regarding medicines.  The following changes have been made:  None  Labs/ tests ordered today include:   Orders Placed This Encounter  Procedures   EXERCISE TOLERANCE TEST (ETT)   EKG 12-Lead     Disposition:   FU with me in one year.    Signed, Minus Breeding, MD  10/21/2022 8:28 AM    [facility]

## 2022-10-21 ENCOUNTER — Ambulatory Visit: Payer: Medicare Other | Attending: Cardiology | Admitting: Cardiology

## 2022-10-21 ENCOUNTER — Encounter: Payer: Self-pay | Admitting: Cardiology

## 2022-10-21 VITALS — BP 138/72 | HR 70 | Ht 64.0 in | Wt 152.4 lb

## 2022-10-21 DIAGNOSIS — I700 Atherosclerosis of aorta: Secondary | ICD-10-CM | POA: Diagnosis not present

## 2022-10-21 DIAGNOSIS — R0602 Shortness of breath: Secondary | ICD-10-CM | POA: Insufficient documentation

## 2022-10-21 DIAGNOSIS — R002 Palpitations: Secondary | ICD-10-CM | POA: Insufficient documentation

## 2022-10-21 DIAGNOSIS — R55 Syncope and collapse: Secondary | ICD-10-CM | POA: Diagnosis not present

## 2022-10-21 DIAGNOSIS — R931 Abnormal findings on diagnostic imaging of heart and coronary circulation: Secondary | ICD-10-CM | POA: Diagnosis not present

## 2022-10-21 NOTE — Addendum Note (Signed)
Addended by: Beatrix Fetters on: 10/21/2022 09:20 AM   Modules accepted: Orders

## 2022-10-21 NOTE — Patient Instructions (Signed)
Medication Instructions:  Your physician recommends that you continue on your current medications as directed. Please refer to the Current Medication list given to you today.  *If you need a refill on your cardiac medications before your next appointment, please call your pharmacy*   Testing/Procedures: Your physician has requested that you have an exercise tolerance test.  Please also follow instruction sheet, as given. This will take place at [address], [address] Do not drink or eat foods with caffeine for 24 hours before the test. (Chocolate, coffee, tea, or energy drinks) If you use an inhaler, bring it with you to the test. Do not smoke for 4 hours before the test. Wear comfortable shoes and clothing.    Follow-Up: At [facility], you and your health needs are our priority.  As part of our continuing mission to provide you with exceptional heart care, we have created designated Provider Care Teams.  These Care Teams include your primary Cardiologist (physician) and Advanced Practice Providers (APPs -  Physician Assistants and Nurse Practitioners) who all work together to provide you with the care you need, when you need it.  We recommend signing up for the patient portal called "MyChart".  Sign up information is provided on this After Visit Summary.  MyChart is used to connect with patients for Virtual Visits (Telemedicine).  Patients are able to view lab/test results, encounter notes, upcoming appointments, etc.  Non-urgent messages can be sent to your provider as well.   To learn more about what you can do with MyChart, go to [email or web address].    Your next appointment:   12 month(s)  Provider:   Minus Breeding, MD

## 2022-10-21 NOTE — Addendum Note (Signed)
Addended by: Minus Breeding on: 10/21/2022 09:26 AM   Modules accepted: Orders

## 2022-10-27 DIAGNOSIS — M5431 Sciatica, right side: Secondary | ICD-10-CM | POA: Diagnosis not present

## 2022-10-27 DIAGNOSIS — M9905 Segmental and somatic dysfunction of pelvic region: Secondary | ICD-10-CM | POA: Diagnosis not present

## 2022-10-27 DIAGNOSIS — M9903 Segmental and somatic dysfunction of lumbar region: Secondary | ICD-10-CM | POA: Diagnosis not present

## 2022-10-27 DIAGNOSIS — M9904 Segmental and somatic dysfunction of sacral region: Secondary | ICD-10-CM | POA: Diagnosis not present

## 2022-11-05 ENCOUNTER — Other Ambulatory Visit: Payer: Self-pay | Admitting: Internal Medicine

## 2022-11-10 ENCOUNTER — Encounter: Payer: Self-pay | Admitting: Internal Medicine

## 2022-11-10 DIAGNOSIS — M9904 Segmental and somatic dysfunction of sacral region: Secondary | ICD-10-CM | POA: Diagnosis not present

## 2022-11-10 DIAGNOSIS — M5431 Sciatica, right side: Secondary | ICD-10-CM | POA: Diagnosis not present

## 2022-11-10 DIAGNOSIS — M9905 Segmental and somatic dysfunction of pelvic region: Secondary | ICD-10-CM | POA: Diagnosis not present

## 2022-11-10 DIAGNOSIS — M9903 Segmental and somatic dysfunction of lumbar region: Secondary | ICD-10-CM | POA: Diagnosis not present

## 2022-11-17 NOTE — Telephone Encounter (Signed)
Unable to reach patient to remind her about her scheduled ETT on 11/18/22 and also to remind her not to take her metoprolol.

## 2022-11-18 ENCOUNTER — Ambulatory Visit (HOSPITAL_COMMUNITY): Payer: Medicare Other | Attending: Cardiovascular Disease

## 2022-11-18 DIAGNOSIS — I700 Atherosclerosis of aorta: Secondary | ICD-10-CM

## 2022-11-18 DIAGNOSIS — R55 Syncope and collapse: Secondary | ICD-10-CM | POA: Insufficient documentation

## 2022-11-18 DIAGNOSIS — R002 Palpitations: Secondary | ICD-10-CM | POA: Diagnosis not present

## 2022-11-18 DIAGNOSIS — R931 Abnormal findings on diagnostic imaging of heart and coronary circulation: Secondary | ICD-10-CM | POA: Insufficient documentation

## 2022-11-18 LAB — EXERCISE TOLERANCE TEST
Angina Index: 0
Duke Treadmill Score: 5
Estimated workload: 6.4
Exercise duration (min): 4 min
Exercise duration (sec): 31 s
MPHR: 137 {beats}/min
Peak HR: 126 {beats}/min
Percent HR: 91 %
Rest HR: 75 {beats}/min
ST Depression (mm): 0 mm

## 2022-11-24 DIAGNOSIS — M9905 Segmental and somatic dysfunction of pelvic region: Secondary | ICD-10-CM | POA: Diagnosis not present

## 2022-11-24 DIAGNOSIS — M9903 Segmental and somatic dysfunction of lumbar region: Secondary | ICD-10-CM | POA: Diagnosis not present

## 2022-11-24 DIAGNOSIS — M5431 Sciatica, right side: Secondary | ICD-10-CM | POA: Diagnosis not present

## 2022-11-24 DIAGNOSIS — M9904 Segmental and somatic dysfunction of sacral region: Secondary | ICD-10-CM | POA: Diagnosis not present

## 2022-11-28 ENCOUNTER — Ambulatory Visit (INDEPENDENT_AMBULATORY_CARE_PROVIDER_SITE_OTHER): Payer: Medicare Other | Admitting: Internal Medicine

## 2022-11-28 ENCOUNTER — Encounter: Payer: Self-pay | Admitting: Internal Medicine

## 2022-11-28 VITALS — BP 118/70 | HR 75 | Temp 98.4°F | Ht 64.0 in | Wt 157.0 lb

## 2022-11-28 DIAGNOSIS — G44309 Post-traumatic headache, unspecified, not intractable: Secondary | ICD-10-CM

## 2022-11-28 DIAGNOSIS — E559 Vitamin D deficiency, unspecified: Secondary | ICD-10-CM | POA: Diagnosis not present

## 2022-11-28 DIAGNOSIS — Z23 Encounter for immunization: Secondary | ICD-10-CM | POA: Diagnosis not present

## 2022-11-28 DIAGNOSIS — E538 Deficiency of other specified B group vitamins: Secondary | ICD-10-CM

## 2022-11-28 DIAGNOSIS — I2583 Coronary atherosclerosis due to lipid rich plaque: Secondary | ICD-10-CM | POA: Diagnosis not present

## 2022-11-28 DIAGNOSIS — G4733 Obstructive sleep apnea (adult) (pediatric): Secondary | ICD-10-CM | POA: Diagnosis not present

## 2022-11-28 DIAGNOSIS — I2510 Atherosclerotic heart disease of native coronary artery without angina pectoris: Secondary | ICD-10-CM

## 2022-11-28 DIAGNOSIS — E039 Hypothyroidism, unspecified: Secondary | ICD-10-CM | POA: Diagnosis not present

## 2022-11-28 NOTE — Progress Notes (Signed)
Subjective:  Patient ID: Elizabeth Gray, female    DOB: [birth year]  Age: 82 y.o. MRN: [id number]  CC: Follow-up (3 mnth f/u)   HPI Elizabeth Gray presents for LE edema, palpitations - resolved, CAD - no CP. Pt had a GXT test  Outpatient Medications Prior to Visit  Medication Sig Dispense Refill   Alum Hydroxide-Mag Carbonate (GAVISCON PO) Take 1 tablet by mouth 3 (three) times daily after meals.     ASPIRIN 81 PO Take by mouth.     Cholecalciferol (VITAMIN D PO) Take 1,000 Units by mouth every other day.      cyanocobalamin (VITAMIN B12) 1000 MCG/ML injection INJECT 1 ML INTRAMUSCULARLY ONCE EVERY 14 DAYS 2 mL 2   Flaxseed, Linseed, (FLAXSEED OIL) 1000 MG CAPS Take 1,000 mg by mouth daily.     furosemide (LASIX) 20 MG tablet Take 1 tablet (20 mg total) by mouth daily as needed. 30 tablet 3   Glucosamine-Chondroitin (MOVE FREE PO) Take 1 tablet by mouth in the morning and at bedtime.     levothyroxine (SYNTHROID) 100 MCG tablet Take 1 tablet by mouth once daily 90 tablet 0   metoprolol succinate (TOPROL-XL) 25 MG 24 hr tablet Take 0.5 tablets (12.5 mg total) by mouth 2 (two) times daily. 90 tablet 3   Multiple Vitamins-Minerals (ICAPS AREDS FORMULA PO) Take 1 capsule by mouth in the morning and at bedtime.     nitroGLYCERIN (NITROSTAT) 0.4 MG SL tablet Place 1 tablet (0.4 mg total) under the tongue every 5 (five) minutes as needed for chest pain. 20 tablet 3   omeprazole-sodium bicarbonate (ZEGERID) 40-1100 MG capsule TAKE 1 CAPSULE BY MOUTH ONCE DAILY BEFORE BREAKFAST 90 capsule 3   Polyethyl Glycol-Propyl Glycol 0.4-0.3 % SOLN Place 2-3 drops into both eyes 2 (two) times daily as needed (dry eyes).     pramipexole (MIRAPEX) 1 MG tablet Take 2 tablets (2 mg total) by mouth at bedtime. 180 tablet 3   pravastatin (PRAVACHOL) 20 MG tablet Take 1 tablet (20 mg total) by mouth daily. Annual appt due in April must see provider for future refills 90 tablet 0   SYRINGE-NEEDLE, DISP, 3 ML  (B-D 3CC LUER-LOK SYR 25GX1") 25G X 1" 3 ML MISC USE AS DIRECTED FOR B12 INJECTIONS 2 each 5   traMADol (ULTRAM) 50 MG tablet Take 1-2 tablets (50-100 mg total) by mouth every 6 (six) hours as needed for moderate pain or severe pain. 40 tablet 0   zolpidem (AMBIEN) 10 MG tablet TAKE 1 TABLET BY MOUTH AT BEDTIME AS NEEDED FOR SLEEP 90 tablet 1   Acetaminophen (TYLENOL PO) Take by mouth.     amoxicillin (AMOXIL) 500 MG capsule Take by mouth.     No facility-administered medications prior to visit.    ROS: Review of Systems  Constitutional:  Positive for fatigue. Negative for activity change, appetite change, chills and unexpected weight change.  HENT:  Negative for congestion, mouth sores and sinus pressure.   Eyes:  Negative for visual disturbance.  Respiratory:  Negative for cough and chest tightness.   Gastrointestinal:  Negative for abdominal pain and nausea.  Genitourinary:  Negative for difficulty urinating, frequency and vaginal pain.  Musculoskeletal:  Negative for back pain and gait problem.  Skin:  Negative for pallor and rash.  Neurological:  Negative for dizziness, tremors, weakness, numbness and headaches.  Psychiatric/Behavioral:  Negative for confusion and sleep disturbance.     Objective:  BP 118/70 (BP Location: Left Arm, Patient  Position: Sitting, Cuff Size: Large)   Pulse 75   Temp 98.4 F (36.9 C) (Oral)   Ht 5\' 4"  (1.626 m)   Wt 157 lb (71.2 kg)   SpO2 93%   BMI 26.95 kg/m   BP Readings from Last 3 Encounters:  11/28/22 118/70  10/21/22 138/72  08/29/22 124/78    Wt Readings from Last 3 Encounters:  11/28/22 157 lb (71.2 kg)  10/21/22 152 lb 6.4 oz (69.1 kg)  08/29/22 149 lb (67.6 kg)    Physical Exam Constitutional:      General: She is not in acute distress.    Appearance: Normal appearance. She is well-developed.  HENT:     Head: Normocephalic.     Right Ear: External ear normal.     Left Ear: External ear normal.     Nose: Nose normal.   Eyes:     General:        Right eye: No discharge.        Left eye: No discharge.     Conjunctiva/sclera: Conjunctivae normal.     Pupils: Pupils are equal, round, and reactive to light.  Neck:     Thyroid: No thyromegaly.     Vascular: No JVD.     Trachea: No tracheal deviation.  Cardiovascular:     Rate and Rhythm: Normal rate and regular rhythm.     Heart sounds: Normal heart sounds.  Pulmonary:     Effort: No respiratory distress.     Breath sounds: No stridor. No wheezing.  Abdominal:     General: Bowel sounds are normal. There is no distension.     Palpations: Abdomen is soft. There is no mass.     Tenderness: There is no abdominal tenderness. There is no guarding or rebound.  Musculoskeletal:        General: No tenderness.     Cervical back: Normal range of motion and neck supple. No rigidity.  Lymphadenopathy:     Cervical: No cervical adenopathy.  Skin:    Findings: No erythema or rash.  Neurological:     Cranial Nerves: No cranial nerve deficit.     Motor: No abnormal muscle tone.     Coordination: Coordination normal.     Deep Tendon Reflexes: Reflexes normal.  Psychiatric:        Behavior: Behavior normal.        Thought Content: Thought content normal.        Judgment: Judgment normal.     Lab Results  Component Value Date   WBC 10.3 08/29/2022   HGB 14.0 08/29/2022   HCT 41.2 08/29/2022   PLT 356.0 08/29/2022   GLUCOSE 126 (H) 08/29/2022   CHOL 146 11/22/2021   TRIG 91.0 11/22/2021   HDL 65.60 11/22/2021   LDLCALC 63 11/22/2021   ALT 22 08/29/2022   AST 28 08/29/2022   NA 139 08/29/2022   K 3.7 08/29/2022   CL 95 (L) 08/29/2022   CREATININE 0.99 08/29/2022   BUN 26 (H) 08/29/2022   CO2 33 (H) 08/29/2022   TSH 16.71 (H) 08/29/2022   INR 3.03 (H) 05/12/2014    MR BRAIN W WO CONTRAST  Result Date: 01/26/2022 [facility] NEUROIM[facility] REPORT STUDY DATE: 01/26/22 PATIENT NAME: Elizabeth Gray DOB: 05-23-1941 MRN: [id number]  ORDERING CLINICIAN: Suanne Marker, MD CLINICAL HISTORY: 82 year old female with loss of consciousness. EXAM: MR BRAIN W WO CONTRAST TECHNIQUE: MRI of the brain with and without contrast was obtained utilizing  multiplanar, multiecho pulse sequences. CONTRAST: 13ml multihance COMPARISON: 10/10/2014 MRI IMAGING SITE: [facility] AT [address] [place] FINDINGS: No abnormal lesions are seen on diffusion-weighted views to suggest acute ischemia. The cortical sulci, fissures and cisterns are normal in size and appearance. Lateral, third and fourth ventricle are normal in size and appearance. No extra-axial fluid collections are seen. No evidence of mass effect or midline shift.  No abnormal lesions are seen on post contrast views.  On sagittal views the posterior fossa, pituitary gland and corpus callosum are unremarkable. No evidence of intracranial hemorrhage on gradient-echo views. The orbits and their contents, paranasal sinuses and calvarium are notable for mucus thickening in the ethmoid sinuses.  Intracranial flow voids are present.   Unremarkable MRI brain with without contrast.  No acute findings. INTERPRETING PHYSICIAN: Suanne Marker, MD Certified in Neurology, Neurophysiology and Neuroimaging [facility] [address] [place], [place] [address] [phone]    Assessment & Plan:   Problem List Items Addressed This Visit     Hypothyroidism    Worse 11/13 On Vit B12 1000 mcg inj q 2-4 weeks AP exercise      Relevant Orders   Comprehensive metabolic panel   Lipid panel   B12 deficiency - Primary    Worse 11/13 On Vit B12 1000 mcg inj q 2-4 weeks AP exercise      Vitamin D deficiency    On Vit D      OSA on CPAP    F/u w/Dr Vassie Loll      Post-traumatic headache, not intractable    Recurret - Exedrin prn      Coronary atherosclerosis    Cont on Pravastatin, ASA, metoprolol      Relevant Orders   Comprehensive  metabolic panel   Lipid panel      No orders of the defined types were placed in this encounter.     Follow-up: Return in about 4 months (around 03/30/2023) for a follow-up visit.  Sonda Primes, MD

## 2022-11-28 NOTE — Addendum Note (Signed)
Addended by: Delsa Grana R on: 11/28/2022 08:49 AM   Modules accepted: Orders

## 2022-11-28 NOTE — Assessment & Plan Note (Signed)
Recurret - Exedrin prn

## 2022-11-28 NOTE — Assessment & Plan Note (Signed)
On Vit D 

## 2022-11-28 NOTE — Assessment & Plan Note (Signed)
Cont on Pravastatin, ASA, metoprolol

## 2022-11-28 NOTE — Assessment & Plan Note (Signed)
Worse 11/13 On Vit B12 1000 mcg inj q 2-4 weeks AP exercise 

## 2022-11-28 NOTE — Assessment & Plan Note (Signed)
F/u w/Dr Alva 

## 2022-11-28 NOTE — Assessment & Plan Note (Signed)
Worse 11/13 On Vit B12 1000 mcg inj q 2-4 weeks AP exercise

## 2022-12-08 DIAGNOSIS — M9903 Segmental and somatic dysfunction of lumbar region: Secondary | ICD-10-CM | POA: Diagnosis not present

## 2022-12-08 DIAGNOSIS — M9905 Segmental and somatic dysfunction of pelvic region: Secondary | ICD-10-CM | POA: Diagnosis not present

## 2022-12-08 DIAGNOSIS — M5431 Sciatica, right side: Secondary | ICD-10-CM | POA: Diagnosis not present

## 2022-12-08 DIAGNOSIS — M9904 Segmental and somatic dysfunction of sacral region: Secondary | ICD-10-CM | POA: Diagnosis not present

## 2022-12-17 ENCOUNTER — Other Ambulatory Visit: Payer: Self-pay | Admitting: Internal Medicine

## 2022-12-20 ENCOUNTER — Other Ambulatory Visit: Payer: Self-pay | Admitting: Internal Medicine

## 2022-12-22 ENCOUNTER — Other Ambulatory Visit: Payer: Self-pay | Admitting: Internal Medicine

## 2022-12-22 DIAGNOSIS — M5431 Sciatica, right side: Secondary | ICD-10-CM | POA: Diagnosis not present

## 2022-12-22 DIAGNOSIS — M9903 Segmental and somatic dysfunction of lumbar region: Secondary | ICD-10-CM | POA: Diagnosis not present

## 2022-12-22 DIAGNOSIS — M9904 Segmental and somatic dysfunction of sacral region: Secondary | ICD-10-CM | POA: Diagnosis not present

## 2022-12-22 DIAGNOSIS — M9905 Segmental and somatic dysfunction of pelvic region: Secondary | ICD-10-CM | POA: Diagnosis not present

## 2022-12-26 ENCOUNTER — Other Ambulatory Visit: Payer: Self-pay | Admitting: Internal Medicine

## 2022-12-27 NOTE — Telephone Encounter (Signed)
MD out of the office until June 4th. Pls advise.Marland KitchenRaechel Chute

## 2023-01-05 DIAGNOSIS — M5431 Sciatica, right side: Secondary | ICD-10-CM | POA: Diagnosis not present

## 2023-01-05 DIAGNOSIS — M9903 Segmental and somatic dysfunction of lumbar region: Secondary | ICD-10-CM | POA: Diagnosis not present

## 2023-01-05 DIAGNOSIS — M9905 Segmental and somatic dysfunction of pelvic region: Secondary | ICD-10-CM | POA: Diagnosis not present

## 2023-01-05 DIAGNOSIS — M9904 Segmental and somatic dysfunction of sacral region: Secondary | ICD-10-CM | POA: Diagnosis not present

## 2023-01-19 ENCOUNTER — Other Ambulatory Visit: Payer: Self-pay | Admitting: Internal Medicine

## 2023-01-19 DIAGNOSIS — M5431 Sciatica, right side: Secondary | ICD-10-CM | POA: Diagnosis not present

## 2023-01-19 DIAGNOSIS — M9905 Segmental and somatic dysfunction of pelvic region: Secondary | ICD-10-CM | POA: Diagnosis not present

## 2023-01-19 DIAGNOSIS — M9904 Segmental and somatic dysfunction of sacral region: Secondary | ICD-10-CM | POA: Diagnosis not present

## 2023-01-19 DIAGNOSIS — M9903 Segmental and somatic dysfunction of lumbar region: Secondary | ICD-10-CM | POA: Diagnosis not present

## 2023-01-20 DIAGNOSIS — Z1231 Encounter for screening mammogram for malignant neoplasm of breast: Secondary | ICD-10-CM | POA: Diagnosis not present

## 2023-01-20 LAB — HM MAMMOGRAPHY

## 2023-01-23 ENCOUNTER — Encounter: Payer: Self-pay | Admitting: Internal Medicine

## 2023-01-27 ENCOUNTER — Other Ambulatory Visit: Payer: Self-pay | Admitting: Internal Medicine

## 2023-01-30 DIAGNOSIS — H40013 Open angle with borderline findings, low risk, bilateral: Secondary | ICD-10-CM | POA: Diagnosis not present

## 2023-02-01 DIAGNOSIS — M9905 Segmental and somatic dysfunction of pelvic region: Secondary | ICD-10-CM | POA: Diagnosis not present

## 2023-02-01 DIAGNOSIS — M5431 Sciatica, right side: Secondary | ICD-10-CM | POA: Diagnosis not present

## 2023-02-01 DIAGNOSIS — M9904 Segmental and somatic dysfunction of sacral region: Secondary | ICD-10-CM | POA: Diagnosis not present

## 2023-02-01 DIAGNOSIS — M9903 Segmental and somatic dysfunction of lumbar region: Secondary | ICD-10-CM | POA: Diagnosis not present

## 2023-02-03 ENCOUNTER — Other Ambulatory Visit: Payer: Self-pay | Admitting: Internal Medicine

## 2023-02-16 DIAGNOSIS — M9904 Segmental and somatic dysfunction of sacral region: Secondary | ICD-10-CM | POA: Diagnosis not present

## 2023-02-16 DIAGNOSIS — M5431 Sciatica, right side: Secondary | ICD-10-CM | POA: Diagnosis not present

## 2023-02-16 DIAGNOSIS — M9905 Segmental and somatic dysfunction of pelvic region: Secondary | ICD-10-CM | POA: Diagnosis not present

## 2023-02-16 DIAGNOSIS — M9903 Segmental and somatic dysfunction of lumbar region: Secondary | ICD-10-CM | POA: Diagnosis not present

## 2023-03-02 DIAGNOSIS — M9903 Segmental and somatic dysfunction of lumbar region: Secondary | ICD-10-CM | POA: Diagnosis not present

## 2023-03-02 DIAGNOSIS — M9904 Segmental and somatic dysfunction of sacral region: Secondary | ICD-10-CM | POA: Diagnosis not present

## 2023-03-02 DIAGNOSIS — M9905 Segmental and somatic dysfunction of pelvic region: Secondary | ICD-10-CM | POA: Diagnosis not present

## 2023-03-02 DIAGNOSIS — M5431 Sciatica, right side: Secondary | ICD-10-CM | POA: Diagnosis not present

## 2023-03-03 ENCOUNTER — Ambulatory Visit (INDEPENDENT_AMBULATORY_CARE_PROVIDER_SITE_OTHER): Payer: Medicare Other

## 2023-03-03 VITALS — Ht 63.75 in | Wt 153.0 lb

## 2023-03-03 DIAGNOSIS — Z0000 Encounter for general adult medical examination without abnormal findings: Secondary | ICD-10-CM | POA: Diagnosis not present

## 2023-03-03 DIAGNOSIS — R600 Localized edema: Secondary | ICD-10-CM | POA: Diagnosis not present

## 2023-03-03 NOTE — Progress Notes (Addendum)
Subjective:   Elizabeth Gray is a 82 y.o. female who presents for Medicare Annual (Subsequent) preventive examination.  Visit Complete: Virtual  I connected with  Elizabeth Gray on 03/03/23 by a audio enabled telemedicine application and verified that I am speaking with the correct person using two identifiers.  Patient Location: Home  Provider Location: Home Office  I discussed the limitations of evaluation and management by telemedicine. The patient expressed understanding and agreed to proceed.  Patient Medicare AWV questionnaire was completed by the patient on 03/02/2023; I have confirmed that all information answered by patient is correct and no changes since this date.  Vital Signs: Per patient no change in vitals since last visit.   Review of Systems    Cardiac Risk Factors include: advanced age (>10men, >71 women);dyslipidemia;Other (see comment), Risk factor comments: Osteopenia,OSA     Objective:    Today's Vitals   03/03/23 0813  Weight: 153 lb (69.4 kg)  Height: 5' 3.75" (1.619 m)   Body mass index is 26.47 kg/m.     03/03/2023    8:33 AM 02/23/2022    2:40 PM 11/23/2021    9:31 AM 08/09/2021   12:33 PM 07/21/2021    9:24 AM 12/31/2019    1:28 PM 09/12/2018    9:14 AM  Advanced Directives  Does Patient Have a Medical Advance Directive? Yes Yes Yes Yes Yes Yes Yes  Type of Estate agent of [place];Living will Healthcare Power of [place];Living will Healthcare Power of [place];Living will  Healthcare Power of [place];Living will  Healthcare Power of [place];Living will  Does patient want to make changes to medical advance directive? No - Patient declined No - Patient declined       Copy of Healthcare Power of Attorney in Chart? Yes - validated most recent copy scanned in chart (See row information) No - copy requested No - copy requested    No - copy requested    Current Medications (verified) Outpatient Encounter Medications as of  03/03/2023  Medication Sig   Alum Hydroxide-Mag Carbonate (GAVISCON PO) Take 1 tablet by mouth 3 (three) times daily after meals.   ASPIRIN 81 PO Take by mouth.   Cholecalciferol (VITAMIN D PO) Take 1,000 Units by mouth every other day.    cyanocobalamin (VITAMIN B12) 1000 MCG/ML injection INJECT 1 ML INTRAMUSCULARLY ONCE EVERY 14 DAYS   Flaxseed, Linseed, (FLAXSEED OIL) 1000 MG CAPS Take 1,000 mg by mouth daily.   furosemide (LASIX) 20 MG tablet TAKE 1 TABLET BY MOUTH ONCE DAILY AS NEEDED   Glucosamine-Chondroitin (MOVE FREE PO) Take 1 tablet by mouth in the morning and at bedtime.   levothyroxine (SYNTHROID) 100 MCG tablet Take 1 tablet by mouth once daily   metoprolol succinate (TOPROL-XL) 25 MG 24 hr tablet Take 0.5 tablets (12.5 mg total) by mouth 2 (two) times daily.   Multiple Vitamins-Minerals (ICAPS AREDS FORMULA PO) Take 1 capsule by mouth in the morning and at bedtime.   nitroGLYCERIN (NITROSTAT) 0.4 MG SL tablet Place 1 tablet (0.4 mg total) under the tongue every 5 (five) minutes as needed for chest pain.   omeprazole-sodium bicarbonate (ZEGERID) 40-1100 MG capsule TAKE 1 CAPSULE BY MOUTH ONCE DAILY BEFORE BREAKFAST   Polyethyl Glycol-Propyl Glycol 0.4-0.3 % SOLN Place 2-3 drops into both eyes 2 (two) times daily as needed (dry eyes).   pramipexole (MIRAPEX) 1 MG tablet Take 2 tablets (2 mg total) by mouth at bedtime.   pravastatin (PRAVACHOL) 20 MG tablet Take  1 tablet (20 mg total) by mouth daily.   SYRINGE-NEEDLE, DISP, 3 ML (B-D 3CC LUER-LOK SYR 25GX1") 25G X 1" 3 ML MISC USE AS DIRECTED FOR B12 INJECTIONS   traMADol (ULTRAM) 50 MG tablet Take 1-2 tablets (50-100 mg total) by mouth every 6 (six) hours as needed for moderate pain or severe pain.   zolpidem (AMBIEN) 10 MG tablet TAKE 1 TABLET BY MOUTH AT BEDTIME AS NEEDED FOR SLEEP   No facility-administered encounter medications on file as of 03/03/2023.    Allergies (verified) Dexilant [dexlansoprazole], Dayquil severe +  vapocool [phenylephrine-dm-gg-apap], Gabapentin, Metoclopramide hcl, Oxymetazoline hcl, Restasis [cyclosporine], Sucralfate, Acetaminophen, Dm-doxylamine-acetaminophen, and Vicks dayquil hbp cold & flu [acetaminophen-dm]   History: Past Medical History:  Diagnosis Date   Allergy    Bursitis    Cancer (HCC) 03/22/2017   Melanoma in situ, lentigo maligna type   Deviated septum    External hemorrhoids without mention of complication 2008   Colonoscopy    Fatigue    GERD (gastroesophageal reflux disease)    Headache(784.0)    migraines   Heat rash    under the breasts.Marland Kitchenappeared on monday.Marland KitchenMarland KitchenBurning & itching, uses cortisone   Hypothyroidism    Iron deficiency anemia, unspecified    Lower esophageal ring 2008   EGD   Nonorganic sleep disorder, unspecified    Osteoarthritis    Osteoporosis    Pancreatitis    Personal history of colonic polyps    Polio 1948   Pulmonary sarcoidosis (HCC)    Sleep apnea    cpap [facility] 09 [facility]   Vitamin B12 deficiency    Vitamin D deficiency    Past Surgical History:  Procedure Laterality Date   APPENDECTOMY  1988   CARPAL TUNNEL RELEASE  2002   rt   CHOLECYSTECTOMY  2000   COLONOSCOPY  12/19/2006   Multiple diminutive polyps destroyed-removed (hyperpastic), internal hemorrhoids   ESOPHAGOGASTRODUODENOSCOPY  12/19/2006   lower esophageal ring dilated   HAND SURGERY  1997   left   KNEE ARTHROSCOPY Bilateral    multiple 2 on lft 1 on rt   LUMBAR DISC SURGERY  1995   Melanoma Removal  Right 2018   right face   ROTATOR CUFF REPAIR Left    Nov 29, 2021   TOE SURGERY Left    left foot next to little toe  joint rem   TOTAL KNEE ARTHROPLASTY Right 05/09/2014   Procedure: RIGHT TOTAL KNEE ARTHROPLASTY;  Surgeon: Verlee Rossetti, MD;  Location: [facility] OR;  Service: Orthopedics;  Laterality: Right;   TOTAL KNEE ARTHROPLASTY Left 08/06/2021   Procedure: TOTAL KNEE ARTHROPLASTY;  Surgeon: Beverely Low, MD;  Location: [facility] ORS;   Service: Orthopedics;  Laterality: Left;   UPPER GASTROINTESTINAL ENDOSCOPY     WISDOM TOOTH EXTRACTION     Family History  Problem Relation Age of Onset   Brain cancer Father        brain tumor   Migraines Mother    Breast cancer Paternal Aunt    Colon cancer Maternal Aunt    Esophageal cancer Maternal Grandfather    Rectal cancer Neg Hx    Stomach cancer Neg Hx    Colon polyps Neg Hx    Social History   Socioeconomic History   Marital status: Widowed    Spouse name: Sherilyn Cooter   Number of children: 0   Years of education: college   Highest education level: Associate degree: academic program  Occupational History   Occupation: Armed forces operational officer  Secretary    Comment: works three days per week    Occupation: LEGAL ASSISTANT 3d/wk    Employer: [employer]  Tobacco Use   Smoking status: Former    Current packs/day: 0.00    Average packs/day: 1 pack/day for 17.0 years (17.0 ttl pk-yrs)    Types: Cigarettes    Start date: 08/01/1973    Quit date: 08/01/1990    Years since quitting: 32.6   Smokeless tobacco: Never  Vaping Use   Vaping status: Never Used  Substance and Sexual Activity   Alcohol use: No    Alcohol/week: 0.0 standard drinks of alcohol   Drug use: No   Sexual activity: Never  Other Topics Concern   Not on file  Social History Narrative   Patient lives alone; feels safe in her home.  Patient is a widow.   Caffeine Use: none   She just retired on August 10, 2017- worked in a Social worker firm for 59 years.   Right handed   Social Determinants of Health   Financial Resource Strain: Low Risk  (03/03/2023)   Overall Financial Resource Strain (CARDIA)    Difficulty of Paying Living Expenses: Not hard at all  Food Insecurity: No Food Insecurity (03/03/2023)   Hunger Vital Sign    Worried About Running Out of Food in the Last Year: Never true    Ran Out of Food in the Last Year: Never true  Transportation Needs: No Transportation Needs (03/03/2023)   PRAPARE - Doctor, general practice (Medical): No    Lack of Transportation (Non-Medical): No  Physical Activity: Inactive (03/03/2023)   Exercise Vital Sign    Days of Exercise per Week: 0 days    Minutes of Exercise per Session: 0 min  Stress: No Stress Concern Present (03/03/2023)   [employer] of Occupational Health - Occupational Stress Questionnaire    Feeling of Stress : Not at all  Social Connections: Moderately Integrated (03/03/2023)   Social Connection and Isolation Panel [NHANES]    Frequency of Communication with Friends and Family: More than three times a week    Frequency of Social Gatherings with Friends and Family: Once a week    Attends Religious Services: More than 4 times per year    Active Member of [employer] or Organizations: Yes    Attends Banker Meetings: More than 4 times per year    Marital Status: Widowed    Tobacco Counseling Counseling given: Not Answered   Clinical Intake:  Pre-visit preparation completed: Yes        BMI - recorded: 26.47 Nutritional Status: BMI 25 -29 Overweight Nutritional Risks: Nausea/ vomitting/ diarrhea (nausea) Diabetes: No  How often do you need to have someone help you when you read instructions, pamphlets, or other written materials from your doctor or pharmacy?: 1 - Never  Interpreter Needed?: No  Information entered by ::  , RMA   Activities of Daily Living    03/03/2023    8:26 AM 03/02/2023   11:45 PM  In your present state of health, do you have any difficulty performing the following activities:  Hearing? 0 0  Vision? 0 0  Difficulty concentrating or making decisions? 0 0  Walking or climbing stairs? 0 0  Dressing or bathing? 0 0  Doing errands, shopping? 0 0  Preparing Food and eating ? N N  Using the Toilet? N N  In the past six months, have you accidently leaked urine? N N  Do you have problems with loss of bowel control? N N  Managing your Medications? N N  Managing your Finances? N N   Housekeeping or managing your Housekeeping? N N    Patient Care Team: Plotnikov, Georgina Quint, MD as PCP - Macario Golds, MD as Consulting Physician (Orthopedic Surgery) Suanne Marker, MD as Consulting Physician (Neurology) Genia Del, Daisy Blossom, MD as Consulting Physician (Ophthalmology) Rollene Rotunda, MD as Consulting Physician (Cardiology) Oretha Milch, MD as Consulting Physician (Pulmonary Disease)  Indicate any recent Medical Services you may have received from other than [facility] providers in the past year (date may be approximate).     Assessment:   This is a routine wellness examination for Marolyn.  Hearing/Vision screen Hearing Screening - Comments:: Wear hearing aides Vision Screening - Comments:: Wears eyeglasses  Dietary issues and exercise activities discussed:     Goals Addressed             This Visit's Progress    My goal is to maintain my health by staying independent, active and eating healthy.   On track     Depression Screen    03/03/2023    8:35 AM 11/28/2022    8:07 AM 08/29/2022    8:08 AM 05/24/2022    8:04 AM 02/23/2022    2:41 PM 01/24/2022    8:04 AM 05/24/2021    8:14 AM  PHQ 2/9 Scores  PHQ - 2 Score 0 0 0 0 0 0 0  PHQ- 9 Score 4  3 8  0 0 4    Fall Risk    03/03/2023    8:34 AM 03/02/2023   11:45 PM 11/28/2022    8:07 AM 08/29/2022    8:08 AM 05/24/2022    8:04 AM  Fall Risk   Falls in the past year? 1 1 0 1 0  Number falls in past yr: 0 0 0 0 0  Injury with Fall?  0 0 1 0  Risk for fall due to :   No Fall Risks History of fall(s) No Fall Risks  Follow up Falls prevention discussed;Falls evaluation completed  Falls evaluation completed Falls evaluation completed     MEDICARE RISK AT HOME:   TIMED UP AND GO:  Was the test performed?  No    Cognitive Function:        02/23/2022    2:33 PM  6CIT Screen  What Year? 0 points  What month? 0 points  What time? 0 points  Count back from 20 0 points  Months in  reverse 0 points  Repeat phrase 0 points  Total Score 0 points    Immunizations Immunization History  Administered Date(s) Administered   Fluad Quad(high Dose 65+) 05/03/2019, 05/11/2020, 05/21/2020, 04/17/2021   Influenza Split 08/31/2011, 05/25/2012   Influenza Whole 08/26/2009   Influenza, High Dose Seasonal PF 07/30/2018, 05/08/2022   Influenza,inj,Quad PF,6+ Mos 05/05/2015, 04/27/2016   Influenza,inj,quad, With Preservative 05/01/2020   Influenza-Unspecified 04/12/2013, 03/31/2014   Moderna Sars-Covid-2 Vaccination 09/05/2019, 10/03/2019, 06/07/2020, 05/09/2022   PFIZER SARS-COV-2 Pediatric Vaccination 5-77yrs 06/05/2020   PNEUMOCOCCAL CONJUGATE-20 11/28/2022   Pneumococcal Conjugate-13 07/19/2013   Pneumococcal Polysaccharide-23 03/06/2009, 09/06/2017   Respiratory Syncytial Virus Vaccine,Recomb Aduvanted(Arexvy) 11/28/2022   Td 03/05/2010   Td (Adult), 2 Lf Tetanus Toxid, Preservative Free 03/05/2010   Tdap 03/23/2020   Zoster Recombinant(Shingrix) 09/14/2018, 01/18/2019   Zoster, Live 03/17/2006    TDAP status: Up to date  Flu Vaccine status: Up to date  Pneumococcal vaccine  status: Up to date  Covid-19 vaccine status: Completed vaccines  Qualifies for Shingles Vaccine? Yes   Zostavax completed Yes   Shingrix Completed?: Yes  Screening Tests Health Maintenance  Topic Date Due   COVID-19 Vaccine (5 - 2023-24 season) 07/04/2022   INFLUENZA VACCINE  03/02/2023   Medicare Annual Wellness (AWV)  03/02/2024   DTaP/Tdap/Td (3 - Td or Tdap) 03/23/2030   Pneumonia Vaccine 37+ Years old  Completed   DEXA SCAN  Completed   Zoster Vaccines- Shingrix  Completed   HPV VACCINES  Aged Out    Health Maintenance  Health Maintenance Due  Topic Date Due   COVID-19 Vaccine (5 - 2023-24 season) 07/04/2022   INFLUENZA VACCINE  03/02/2023    Colorectal cancer screening: No longer required.   Mammogram status: Completed 01/20/2023. Repeat every year  Bone Density  status: Completed 01/14/2022. Results reflect: Bone density results: OSTEOPOROSIS. Repeat every 2 years.  Lung Cancer Screening: (Low Dose CT Chest recommended if Age 52-80 years, 20 pack-year currently smoking OR have quit w/in 15years.) does not qualify.   Lung Cancer Screening Referral: N/A  Additional Screening:  Hepatitis C Screening: does not qualify;  Vision Screening: Recommended annual ophthalmology exams for early detection of glaucoma and other disorders of the eye. Is the patient up to date with their annual eye exam?  Yes  Who is the provider or what is the name of the office in which the patient attends annual eye exams? Dr. Genia Del If pt is not established with a provider, would they like to be referred to a provider to establish care? No .   Dental Screening: Recommended annual dental exams for proper oral hygiene  Community Resource Referral / Chronic Care Management: CRR required this visit?  No   CCM required this visit?  No     Plan:     I have personally reviewed and noted the following in the patient's chart:   Medical and social history Use of alcohol, tobacco or illicit drugs  Current medications and supplements including opioid prescriptions. Patient is not currently taking opioid prescriptions. Functional ability and status Nutritional status Physical activity Advanced directives List of other physicians Hospitalizations, surgeries, and ER visits in previous 12 months Vitals Screenings to include cognitive, depression, and falls Referrals and appointments  In addition, I have reviewed and discussed with patient certain preventive protocols, quality metrics, and best practice recommendations. A written personalized care plan for preventive services as well as general preventive health recommendations were provided to patient.      L , CMA   03/03/2023   After Visit Summary: (MyChart) Due to this being a telephonic visit, the after visit  summary with patients personalized plan was offered to patient via MyChart   Nurse Notes: Patient complained of swelling in both ankles today.  She stated it started out as a red bump/spot and as the days went by bumps have spread over to other ankle and swelling started.  Patient stated that her ankles feel tight and also that it's starting to spread up her leg, (red bumps/spots).  This started 1 week ago and has been worsening.  I encouraged patient to see a doctor asap, so she will go to urgent care today and has an appointment for Monday to follow up with Dr. Posey Rea.  She has no other concerns today.   Medical screening examination/treatment/procedure(s) were performed by non-physician practitioner and as supervising physician I was immediately available for consultation/collaboration.  I agree with above.  Jacinta Shoe, MD

## 2023-03-03 NOTE — Patient Instructions (Signed)
Elizabeth Gray , Thank you for taking time to come for your Medicare Wellness Visit. I appreciate your ongoing commitment to your health goals. Please review the following plan we discussed and let me know if I can assist you in the future.   Referrals/Orders/Follow-Ups/Clinician Recommendations: Go to urgent care today for worsening of swelling in ankles and follow up with Dr. Posey Rea Monday.  Keep up the good work and each day, aim for 6 glasses of water, plenty of protein in your diet and try to stretch every hour for 5-10 minutes at a time.     This is a list of the screening recommended for you and due dates:  Health Maintenance  Topic Date Due   COVID-19 Vaccine (5 - 2023-24 season) 07/04/2022   Flu Shot  03/02/2023   Medicare Annual Wellness Visit  03/02/2024   DTaP/Tdap/Td vaccine (3 - Td or Tdap) 03/23/2030   Pneumonia Vaccine  Completed   DEXA scan (bone density measurement)  Completed   Zoster (Shingles) Vaccine  Completed   HPV Vaccine  Aged Out    Advanced directives: (ACP Link)Information on Advanced Care Planning can be found at [facility] Advance Health Care Directives Advance Health Care Directives ([email or web address]   Next Medicare Annual Wellness Visit scheduled for next year: Yes  Preventive Care 65 Years and Older, Female Preventive care refers to lifestyle choices and visits with your health care provider that can promote health and wellness. What does preventive care include? A yearly physical exam. This is also called an annual well check. Dental exams once or twice a year. Routine eye exams. Ask your health care provider how often you should have your eyes checked. Personal lifestyle choices, including: Daily care of your teeth and gums. Regular physical activity. Eating a healthy diet. Avoiding tobacco and drug use. Limiting alcohol use. Practicing safe sex. Taking low-dose aspirin every day. Taking vitamin and mineral supplements as  recommended by your health care provider. What happens during an annual well check? The services and screenings done by your health care provider during your annual well check will depend on your age, overall health, lifestyle risk factors, and family history of disease. Counseling  Your health care provider may ask you questions about your: Alcohol use. Tobacco use. Drug use. Emotional well-being. Home and relationship well-being. Sexual activity. Eating habits. History of falls. Memory and ability to understand (cognition). Work and work Astronomer. Reproductive health. Screening  You may have the following tests or measurements: Height, weight, and BMI. Blood pressure. Lipid and cholesterol levels. These may be checked every 5 years, or more frequently if you are over 62 years old. Skin check. Lung cancer screening. You may have this screening every year starting at age 73 if you have a 30-pack-year history of smoking and currently smoke or have quit within the past 15 years. Fecal occult blood test (FOBT) of the stool. You may have this test every year starting at age 3. Flexible sigmoidoscopy or colonoscopy. You may have a sigmoidoscopy every 5 years or a colonoscopy every 10 years starting at age 69. Hepatitis C blood test. Hepatitis B blood test. Sexually transmitted disease (STD) testing. Diabetes screening. This is done by checking your blood sugar (glucose) after you have not eaten for a while (fasting). You may have this done every 1-3 years. Bone density scan. This is done to screen for osteoporosis. You may have this done starting at age 81. Mammogram. This may be done every 1-2 years.  Talk to your health care provider about how often you should have regular mammograms. Talk with your health care provider about your test results, treatment options, and if necessary, the need for more tests. Vaccines  Your health care provider may recommend certain vaccines, such  as: Influenza vaccine. This is recommended every year. Tetanus, diphtheria, and acellular pertussis (Tdap, Td) vaccine. You may need a Td booster every 10 years. Zoster vaccine. You may need this after age 54. Pneumococcal 13-valent conjugate (PCV13) vaccine. One dose is recommended after age 66. Pneumococcal polysaccharide (PPSV23) vaccine. One dose is recommended after age 13. Talk to your health care provider about which screenings and vaccines you need and how often you need them. This information is not intended to replace advice given to you by your health care provider. Make sure you discuss any questions you have with your health care provider. Document Released: 08/14/2015 Document Revised: 04/06/2016 Document Reviewed: 05/19/2015 Elsevier Interactive Patient Education  2017 [employer].  Fall Prevention in the Home Falls can cause injuries. They can happen to people of all ages. There are many things you can do to make your home safe and to help prevent falls. What can I do on the outside of my home? Regularly fix the edges of walkways and driveways and fix any cracks. Remove anything that might make you trip as you walk through a door, such as a raised step or threshold. Trim any bushes or trees on the path to your home. Use bright outdoor lighting. Clear any walking paths of anything that might make someone trip, such as rocks or tools. Regularly check to see if handrails are loose or broken. Make sure that both sides of any steps have handrails. Any raised decks and porches should have guardrails on the edges. Have any leaves, snow, or ice cleared regularly. Use sand or salt on walking paths during winter. Clean up any spills in your garage right away. This includes oil or grease spills. What can I do in the bathroom? Use night lights. Install grab bars by the toilet and in the tub and shower. Do not use towel bars as grab bars. Use non-skid mats or decals in the tub or  shower. If you need to sit down in the shower, use a plastic, non-slip stool. Keep the floor dry. Clean up any water that spills on the floor as soon as it happens. Remove soap buildup in the tub or shower regularly. Attach bath mats securely with double-sided non-slip rug tape. Do not have throw rugs and other things on the floor that can make you trip. What can I do in the bedroom? Use night lights. Make sure that you have a light by your bed that is easy to reach. Do not use any sheets or blankets that are too big for your bed. They should not hang down onto the floor. Have a firm chair that has side arms. You can use this for support while you get dressed. Do not have throw rugs and other things on the floor that can make you trip. What can I do in the kitchen? Clean up any spills right away. Avoid walking on wet floors. Keep items that you use a lot in easy-to-reach places. If you need to reach something above you, use a strong step stool that has a grab bar. Keep electrical cords out of the way. Do not use floor polish or wax that makes floors slippery. If you must use wax, use non-skid floor wax.  Do not have throw rugs and other things on the floor that can make you trip. What can I do with my stairs? Do not leave any items on the stairs. Make sure that there are handrails on both sides of the stairs and use them. Fix handrails that are broken or loose. Make sure that handrails are as long as the stairways. Check any carpeting to make sure that it is firmly attached to the stairs. Fix any carpet that is loose or worn. Avoid having throw rugs at the top or bottom of the stairs. If you do have throw rugs, attach them to the floor with carpet tape. Make sure that you have a light switch at the top of the stairs and the bottom of the stairs. If you do not have them, ask someone to add them for you. What else can I do to help prevent falls? Wear shoes that: Do not have high heels. Have  rubber bottoms. Are comfortable and fit you well. Are closed at the toe. Do not wear sandals. If you use a stepladder: Make sure that it is fully opened. Do not climb a closed stepladder. Make sure that both sides of the stepladder are locked into place. Ask someone to hold it for you, if possible. Clearly mark and make sure that you can see: Any grab bars or handrails. First and last steps. Where the edge of each step is. Use tools that help you move around (mobility aids) if they are needed. These include: Canes. Walkers. Scooters. Crutches. Turn on the lights when you go into a dark area. Replace any light bulbs as soon as they burn out. Set up your furniture so you have a clear path. Avoid moving your furniture around. If any of your floors are uneven, fix them. If there are any pets around you, be aware of where they are. Review your medicines with your doctor. Some medicines can make you feel dizzy. This can increase your chance of falling. Ask your doctor what other things that you can do to help prevent falls. This information is not intended to replace advice given to you by your health care provider. Make sure you discuss any questions you have with your health care provider. Document Released: 05/14/2009 Document Revised: 12/24/2015 Document Reviewed: 08/22/2014 Elsevier Interactive Patient Education  2017 [employer].

## 2023-03-06 ENCOUNTER — Encounter: Payer: Self-pay | Admitting: Internal Medicine

## 2023-03-06 ENCOUNTER — Ambulatory Visit: Payer: Medicare Other | Admitting: Internal Medicine

## 2023-03-06 VITALS — BP 118/60 | HR 71 | Temp 98.6°F | Ht 63.75 in | Wt 162.0 lb

## 2023-03-06 DIAGNOSIS — R0789 Other chest pain: Secondary | ICD-10-CM | POA: Diagnosis not present

## 2023-03-06 DIAGNOSIS — I2510 Atherosclerotic heart disease of native coronary artery without angina pectoris: Secondary | ICD-10-CM | POA: Diagnosis not present

## 2023-03-06 DIAGNOSIS — E538 Deficiency of other specified B group vitamins: Secondary | ICD-10-CM | POA: Diagnosis not present

## 2023-03-06 DIAGNOSIS — E785 Hyperlipidemia, unspecified: Secondary | ICD-10-CM

## 2023-03-06 DIAGNOSIS — E039 Hypothyroidism, unspecified: Secondary | ICD-10-CM | POA: Diagnosis not present

## 2023-03-06 DIAGNOSIS — K219 Gastro-esophageal reflux disease without esophagitis: Secondary | ICD-10-CM

## 2023-03-06 LAB — COMPREHENSIVE METABOLIC PANEL
ALT: 19 U/L (ref 0–35)
AST: 27 U/L (ref 0–37)
Albumin: 4.5 g/dL (ref 3.5–5.2)
Alkaline Phosphatase: 92 U/L (ref 39–117)
BUN: 27 mg/dL — ABNORMAL HIGH (ref 6–23)
CO2: 34 mEq/L — ABNORMAL HIGH (ref 19–32)
Calcium: 9.7 mg/dL (ref 8.4–10.5)
Chloride: 96 mEq/L (ref 96–112)
Creatinine, Ser: 0.93 mg/dL (ref 0.40–1.20)
GFR: 56.53 mL/min — ABNORMAL LOW (ref >60.00)
Glucose, Bld: 117 mg/dL — ABNORMAL HIGH (ref 70–99)
Potassium: 3.6 mEq/L (ref 3.5–5.1)
Sodium: 139 mEq/L (ref 135–145)
Total Bilirubin: 0.5 mg/dL (ref 0.2–1.2)
Total Protein: 8.1 g/dL (ref 6.0–8.3)

## 2023-03-06 LAB — LIPID PANEL
Cholesterol: 131 mg/dL (ref 0–200)
HDL: 60.7 mg/dL (ref >39.00)
LDL Cholesterol: 53 mg/dL (ref 0–99)
NonHDL: 70.59
Total CHOL/HDL Ratio: 2
Triglycerides: 86 mg/dL (ref 0.0–149.0)
VLDL: 17.2 mg/dL (ref 0.0–40.0)

## 2023-03-06 NOTE — Assessment & Plan Note (Signed)
On B12 

## 2023-03-06 NOTE — Assessment & Plan Note (Signed)
CT coronary calcium score is 289 On Pravastatin Recent CP on Thursday at church - took NTG. She ate some fried food prior.Marland KitchenMarland KitchenMarland KitchenProbable GERD Use Gaviscon prn F/u w/Cardiology

## 2023-03-06 NOTE — Assessment & Plan Note (Signed)
GERD related Gaviscon prn Cardiology f/u

## 2023-03-06 NOTE — Progress Notes (Signed)
Subjective:  Patient ID: Elizabeth Gray, female    DOB: 1941-06-02  Age: 82 y.o. MRN: [id number]  CC: Follow-up (4 mnth f/u, pt states she had some chest pains last week THURSDAY and she did use a Nitroglycerin)   HPI Elizabeth Gray presents for leg edema at work C/o CP on Thursday at church - took NTG. She ate some fried food prior....  Outpatient Medications Prior to Visit  Medication Sig Dispense Refill   Alum Hydroxide-Mag Carbonate (GAVISCON PO) Take 1 tablet by mouth 3 (three) times daily after meals.     ASPIRIN 81 PO Take by mouth.     Cholecalciferol (VITAMIN D PO) Take 1,000 Units by mouth every other day.      cyanocobalamin (VITAMIN B12) 1000 MCG/ML injection INJECT 1 ML INTRAMUSCULARLY ONCE EVERY 14 DAYS 6 mL 11   Flaxseed, Linseed, (FLAXSEED OIL) 1000 MG CAPS Take 1,000 mg by mouth daily.     furosemide (LASIX) 20 MG tablet TAKE 1 TABLET BY MOUTH ONCE DAILY AS NEEDED 30 tablet 5   Glucosamine-Chondroitin (MOVE FREE PO) Take 1 tablet by mouth in the morning and at bedtime.     levothyroxine (SYNTHROID) 100 MCG tablet Take 1 tablet by mouth once daily 90 tablet 0   metoprolol succinate (TOPROL-XL) 25 MG 24 hr tablet Take 0.5 tablets (12.5 mg total) by mouth 2 (two) times daily. 90 tablet 3   Multiple Vitamins-Minerals (ICAPS AREDS FORMULA PO) Take 1 capsule by mouth in the morning and at bedtime.     nitroGLYCERIN (NITROSTAT) 0.4 MG SL tablet Place 1 tablet (0.4 mg total) under the tongue every 5 (five) minutes as needed for chest pain. 20 tablet 3   omeprazole-sodium bicarbonate (ZEGERID) 40-1100 MG capsule TAKE 1 CAPSULE BY MOUTH ONCE DAILY BEFORE BREAKFAST 90 capsule 3   Polyethyl Glycol-Propyl Glycol 0.4-0.3 % SOLN Place 2-3 drops into both eyes 2 (two) times daily as needed (dry eyes).     pramipexole (MIRAPEX) 1 MG tablet Take 2 tablets (2 mg total) by mouth at bedtime. 180 tablet 3   pravastatin (PRAVACHOL) 20 MG tablet Take 1 tablet (20 mg total) by mouth daily.  90 tablet 1   SYRINGE-NEEDLE, DISP, 3 ML (B-D 3CC LUER-LOK SYR 25GX1") 25G X 1" 3 ML MISC USE AS DIRECTED FOR B12 INJECTIONS 2 each 5   traMADol (ULTRAM) 50 MG tablet Take 1-2 tablets (50-100 mg total) by mouth every 6 (six) hours as needed for moderate pain or severe pain. 40 tablet 0   zolpidem (AMBIEN) 10 MG tablet TAKE 1 TABLET BY MOUTH AT BEDTIME AS NEEDED FOR SLEEP 30 tablet 0   No facility-administered medications prior to visit.    ROS: Review of Systems  Constitutional:  Negative for activity change, appetite change, chills, fatigue and unexpected weight change.  HENT:  Negative for congestion, mouth sores and sinus pressure.   Eyes:  Negative for visual disturbance.  Respiratory:  Negative for cough and chest tightness.   Cardiovascular:  Positive for chest pain.  Gastrointestinal:  Negative for abdominal pain and nausea.  Genitourinary:  Negative for difficulty urinating, frequency and vaginal pain.  Musculoskeletal:  Negative for back pain and gait problem.  Skin:  Negative for pallor and rash.  Neurological:  Negative for dizziness, tremors, weakness, numbness and headaches.  Psychiatric/Behavioral:  Negative for confusion and sleep disturbance.     Objective:  BP 118/60 (BP Location: Left Arm, Patient Position: Sitting, Cuff Size: Large)   Pulse 71  Temp 98.6 F (37 C) (Oral)   Ht 5' 3.75" (1.619 m)   Wt 162 lb (73.5 kg)   SpO2 98%   BMI 28.03 kg/m   BP Readings from Last 3 Encounters:  03/06/23 118/60  11/28/22 118/70  10/21/22 138/72    Wt Readings from Last 3 Encounters:  03/06/23 162 lb (73.5 kg)  03/03/23 153 lb (69.4 kg)  11/28/22 157 lb (71.2 kg)    Physical Exam Constitutional:      General: She is not in acute distress.    Appearance: She is well-developed.  HENT:     Head: Normocephalic.     Right Ear: External ear normal.     Left Ear: External ear normal.     Nose: Nose normal.  Eyes:     General:        Right eye: No discharge.         Left eye: No discharge.     Conjunctiva/sclera: Conjunctivae normal.     Pupils: Pupils are equal, round, and reactive to light.  Neck:     Thyroid: No thyromegaly.     Vascular: No JVD.     Trachea: No tracheal deviation.  Cardiovascular:     Rate and Rhythm: Normal rate and regular rhythm.     Heart sounds: Normal heart sounds.  Pulmonary:     Effort: No respiratory distress.     Breath sounds: No stridor. No wheezing.  Abdominal:     General: Bowel sounds are normal. There is no distension.     Palpations: Abdomen is soft. There is no mass.     Tenderness: There is no abdominal tenderness. There is no guarding or rebound.  Musculoskeletal:        General: No tenderness.     Cervical back: Normal range of motion and neck supple. No rigidity.  Lymphadenopathy:     Cervical: No cervical adenopathy.  Skin:    Findings: No erythema or rash.  Neurological:     Cranial Nerves: No cranial nerve deficit.     Motor: No abnormal muscle tone.     Coordination: Coordination normal.     Deep Tendon Reflexes: Reflexes normal.  Psychiatric:        Behavior: Behavior normal.        Thought Content: Thought content normal.        Judgment: Judgment normal.     Lab Results  Component Value Date   WBC 10.3 08/29/2022   HGB 14.0 08/29/2022   HCT 41.2 08/29/2022   PLT 356.0 08/29/2022   GLUCOSE 126 (H) 08/29/2022   CHOL 146 11/22/2021   TRIG 91.0 11/22/2021   HDL 65.60 11/22/2021   LDLCALC 63 11/22/2021   ALT 22 08/29/2022   AST 28 08/29/2022   NA 139 08/29/2022   K 3.7 08/29/2022   CL 95 (L) 08/29/2022   CREATININE 0.99 08/29/2022   BUN 26 (H) 08/29/2022   CO2 33 (H) 08/29/2022   TSH 16.71 (H) 08/29/2022   INR 3.03 (H) 05/12/2014    MR BRAIN W WO CONTRAST  Result Date: 01/26/2022 [facility] NEUROIMAGING REPORT STUDY DATE: 01/26/22 PATIENT NAME: Elizabeth Gray DOB: [birth year] MRN: [id number] ORDERING CLINICIAN: Suanne Marker, MD CLINICAL  HISTORY: 82 year old female with loss of consciousness. EXAM: MR BRAIN W WO CONTRAST TECHNIQUE: MRI of the brain with and without contrast was obtained utilizing  multiplanar, multiecho pulse sequences. CONTRAST: 13ml multihance COMPARISON: 10/10/2014 MRI IMAGING SITE: [facility] [place]  [facility]AGING AT [address] [place] FINDINGS: No abnormal lesions are seen on diffusion-weighted views to suggest acute ischemia. The cortical sulci, fissures and cisterns are normal in size and appearance. Lateral, third and fourth ventricle are normal in size and appearance. No extra-axial fluid collections are seen. No evidence of mass effect or midline shift.  No abnormal lesions are seen on post contrast views.  On sagittal views the posterior fossa, pituitary gland and corpus callosum are unremarkable. No evidence of intracranial hemorrhage on gradient-echo views. The orbits and their contents, paranasal sinuses and calvarium are notable for mucus thickening in the ethmoid sinuses.  Intracranial flow voids are present.   Unremarkable MRI brain with without contrast.  No acute findings. INTERPRETING PHYSICIAN: Suanne Marker, MD Certified in Neurology, Neurophysiology and Neuroimaging [facility] [address] [place], [place] [address] [phone]    Assessment & Plan:   Problem List Items Addressed This Visit     Hypothyroidism    On B12      B12 deficiency    On B12      GERD    On Zegerid Recent CP on Thursday at church - took NTG. She ate some fried food prior.Marland KitchenMarland KitchenMarland KitchenProbable GERD Use Gaviscon prn       Chest pain, atypical    GERD related Gaviscon prn Cardiology f/u      Coronary atherosclerosis   Dyslipidemia - Primary    CT coronary calcium score is 289 On Pravastatin Recent CP on Thursday at church - took NTG. She ate some fried food prior.Marland KitchenMarland KitchenMarland KitchenProbable GERD Use Gaviscon prn F/u w/Cardiology          No orders of the defined types were  placed in this encounter.     Follow-up: Return in about 3 months (around 06/06/2023) for a follow-up visit.  Sonda Primes, MD

## 2023-03-06 NOTE — Assessment & Plan Note (Signed)
On Zegerid Recent CP on Thursday at church - took NTG. She ate some fried food prior.Marland KitchenMarland KitchenMarland KitchenProbable GERD Use Gaviscon prn

## 2023-03-10 ENCOUNTER — Other Ambulatory Visit: Payer: Self-pay | Admitting: Cardiology

## 2023-03-13 DIAGNOSIS — M5431 Sciatica, right side: Secondary | ICD-10-CM | POA: Diagnosis not present

## 2023-03-13 DIAGNOSIS — M9903 Segmental and somatic dysfunction of lumbar region: Secondary | ICD-10-CM | POA: Diagnosis not present

## 2023-03-13 DIAGNOSIS — M9904 Segmental and somatic dysfunction of sacral region: Secondary | ICD-10-CM | POA: Diagnosis not present

## 2023-03-13 DIAGNOSIS — M9905 Segmental and somatic dysfunction of pelvic region: Secondary | ICD-10-CM | POA: Diagnosis not present

## 2023-03-16 DIAGNOSIS — M5431 Sciatica, right side: Secondary | ICD-10-CM | POA: Diagnosis not present

## 2023-03-16 DIAGNOSIS — M9903 Segmental and somatic dysfunction of lumbar region: Secondary | ICD-10-CM | POA: Diagnosis not present

## 2023-03-16 DIAGNOSIS — M9904 Segmental and somatic dysfunction of sacral region: Secondary | ICD-10-CM | POA: Diagnosis not present

## 2023-03-16 DIAGNOSIS — M9905 Segmental and somatic dysfunction of pelvic region: Secondary | ICD-10-CM | POA: Diagnosis not present

## 2023-03-17 ENCOUNTER — Other Ambulatory Visit: Payer: Self-pay | Admitting: Internal Medicine

## 2023-03-23 DIAGNOSIS — M9905 Segmental and somatic dysfunction of pelvic region: Secondary | ICD-10-CM | POA: Diagnosis not present

## 2023-03-23 DIAGNOSIS — M9903 Segmental and somatic dysfunction of lumbar region: Secondary | ICD-10-CM | POA: Diagnosis not present

## 2023-03-23 DIAGNOSIS — M5431 Sciatica, right side: Secondary | ICD-10-CM | POA: Diagnosis not present

## 2023-03-23 DIAGNOSIS — M9904 Segmental and somatic dysfunction of sacral region: Secondary | ICD-10-CM | POA: Diagnosis not present

## 2023-03-27 DIAGNOSIS — M8580 Other specified disorders of bone density and structure, unspecified site: Secondary | ICD-10-CM | POA: Diagnosis not present

## 2023-03-27 DIAGNOSIS — Z85820 Personal history of malignant melanoma of skin: Secondary | ICD-10-CM | POA: Diagnosis not present

## 2023-03-27 DIAGNOSIS — L821 Other seborrheic keratosis: Secondary | ICD-10-CM | POA: Diagnosis not present

## 2023-03-27 DIAGNOSIS — L905 Scar conditions and fibrosis of skin: Secondary | ICD-10-CM | POA: Diagnosis not present

## 2023-03-27 DIAGNOSIS — Z01419 Encounter for gynecological examination (general) (routine) without abnormal findings: Secondary | ICD-10-CM | POA: Diagnosis not present

## 2023-03-27 DIAGNOSIS — L820 Inflamed seborrheic keratosis: Secondary | ICD-10-CM | POA: Diagnosis not present

## 2023-04-06 DIAGNOSIS — M25511 Pain in right shoulder: Secondary | ICD-10-CM | POA: Diagnosis not present

## 2023-04-12 DIAGNOSIS — M9903 Segmental and somatic dysfunction of lumbar region: Secondary | ICD-10-CM | POA: Diagnosis not present

## 2023-04-12 DIAGNOSIS — M9905 Segmental and somatic dysfunction of pelvic region: Secondary | ICD-10-CM | POA: Diagnosis not present

## 2023-04-12 DIAGNOSIS — M9904 Segmental and somatic dysfunction of sacral region: Secondary | ICD-10-CM | POA: Diagnosis not present

## 2023-04-12 DIAGNOSIS — M5431 Sciatica, right side: Secondary | ICD-10-CM | POA: Diagnosis not present

## 2023-04-20 DIAGNOSIS — M5431 Sciatica, right side: Secondary | ICD-10-CM | POA: Diagnosis not present

## 2023-04-20 DIAGNOSIS — M9903 Segmental and somatic dysfunction of lumbar region: Secondary | ICD-10-CM | POA: Diagnosis not present

## 2023-04-20 DIAGNOSIS — M9904 Segmental and somatic dysfunction of sacral region: Secondary | ICD-10-CM | POA: Diagnosis not present

## 2023-04-20 DIAGNOSIS — M9905 Segmental and somatic dysfunction of pelvic region: Secondary | ICD-10-CM | POA: Diagnosis not present

## 2023-04-27 DIAGNOSIS — M9904 Segmental and somatic dysfunction of sacral region: Secondary | ICD-10-CM | POA: Diagnosis not present

## 2023-04-27 DIAGNOSIS — M5431 Sciatica, right side: Secondary | ICD-10-CM | POA: Diagnosis not present

## 2023-04-27 DIAGNOSIS — M9905 Segmental and somatic dysfunction of pelvic region: Secondary | ICD-10-CM | POA: Diagnosis not present

## 2023-04-27 DIAGNOSIS — M9903 Segmental and somatic dysfunction of lumbar region: Secondary | ICD-10-CM | POA: Diagnosis not present

## 2023-05-03 DIAGNOSIS — M545 Low back pain, unspecified: Secondary | ICD-10-CM | POA: Diagnosis not present

## 2023-05-03 DIAGNOSIS — M25511 Pain in right shoulder: Secondary | ICD-10-CM | POA: Diagnosis not present

## 2023-05-07 ENCOUNTER — Other Ambulatory Visit: Payer: Self-pay | Admitting: Internal Medicine

## 2023-05-11 ENCOUNTER — Other Ambulatory Visit: Payer: Self-pay | Admitting: Physician Assistant

## 2023-05-19 DIAGNOSIS — M25511 Pain in right shoulder: Secondary | ICD-10-CM | POA: Diagnosis not present

## 2023-05-23 DIAGNOSIS — Z23 Encounter for immunization: Secondary | ICD-10-CM | POA: Diagnosis not present

## 2023-05-25 DIAGNOSIS — M75121 Complete rotator cuff tear or rupture of right shoulder, not specified as traumatic: Secondary | ICD-10-CM | POA: Diagnosis not present

## 2023-05-26 ENCOUNTER — Telehealth: Payer: Self-pay

## 2023-05-26 NOTE — Telephone Encounter (Signed)
Primary Cardiologist:James Hochrein, MD   Preoperative team, please contact this patient and set up a phone call appointment for further preoperative risk assessment. Please obtain consent and complete medication review. Thank you for your help.   There is no cardiac indication for aspirin aside from prevention. Would recommend APP evaluate aspirin use at time of virtual visit.   I also confirmed the patient resides in the state of [place]. As per [facility] Medical Board telemedicine laws, the patient must reside in the state in which the provider is licensed.   Levi Aland, NP-C  05/26/2023, 5:00 PM [address] Office [phone] Fax [phone]

## 2023-05-26 NOTE — Telephone Encounter (Signed)
   Pre-operative Risk Assessment    Patient Name: Elizabeth Gray  DOB: 05-24-1941 MRN: [id number]      Request for Surgical Clearance    Procedure:   Right Shoulder scop SAD, mini open RCR  Date of Surgery:  Clearance TBD                                 Surgeon:  Dr. Malon Kindle Surgeon's Group or Practice Name:  [facility] Phone number:  [phone] Fax number:  [phone]   Type of Clearance Requested:   - Medical  - Pharmacy:  Hold Aspirin     Type of Anesthesia:   Choice   Additional requests/questions:    [name]   05/26/2023, 4:42 PM

## 2023-05-28 DIAGNOSIS — M25511 Pain in right shoulder: Secondary | ICD-10-CM | POA: Insufficient documentation

## 2023-05-30 ENCOUNTER — Telehealth: Payer: Self-pay | Admitting: *Deleted

## 2023-05-30 NOTE — Telephone Encounter (Signed)
Pt has been scheduled tele pre op appt 06/08/23. Pt states surgeon office will not schedule surgery until she has been cleared. Med rec and consent are done.     Patient Consent for Virtual Visit        [name] has provided verbal consent on 05/30/2023 for a virtual visit (video or telephone).   CONSENT FOR VIRTUAL VISIT FOR:  Elizabeth Gray  By participating in this virtual visit I agree to the following:  I hereby voluntarily request, consent and authorize [facility] and its employed or contracted physicians, physician assistants, nurse practitioners or other licensed health care professionals (the Practitioner), to provide me with telemedicine health care services (the "Services") as deemed necessary by the treating Practitioner. I acknowledge and consent to receive the Services by the Practitioner via telemedicine. I understand that the telemedicine visit will involve communicating with the Practitioner through live audiovisual communication technology and the disclosure of certain medical information by electronic transmission. I acknowledge that I have been given the opportunity to request an in-person assessment or other available alternative prior to the telemedicine visit and am voluntarily participating in the telemedicine visit.  I understand that I have the right to withhold or withdraw my consent to the use of telemedicine in the course of my care at any time, without affecting my right to future care or treatment, and that the Practitioner or I may terminate the telemedicine visit at any time. I understand that I have the right to inspect all information obtained and/or recorded in the course of the telemedicine visit and may receive copies of available information for a reasonable fee.  I understand that some of the potential risks of receiving the Services via telemedicine include:  Delay or interruption in medical evaluation due to technological equipment  failure or disruption; Information transmitted may not be sufficient (e.g. poor resolution of images) to allow for appropriate medical decision making by the Practitioner; and/or  In rare instances, security protocols could fail, causing a breach of personal health information.  Furthermore, I acknowledge that it is my responsibility to provide information about my medical history, conditions and care that is complete and accurate to the best of my ability. I acknowledge that Practitioner's advice, recommendations, and/or decision may be based on factors not within their control, such as incomplete or inaccurate data provided by me or distortions of diagnostic images or specimens that may result from electronic transmissions. I understand that the practice of medicine is not an exact science and that Practitioner makes no warranties or guarantees regarding treatment outcomes. I acknowledge that a copy of this consent can be made available to me via my patient portal [facility] MyChart), or I can request a printed copy by calling the office of [facility].    I understand that my insurance will be billed for this visit.   I have read or had this consent read to me. I understand the contents of this consent, which adequately explains the benefits and risks of the Services being provided via telemedicine.  I have been provided ample opportunity to ask questions regarding this consent and the Services and have had my questions answered to my satisfaction. I give my informed consent for the services to be provided through the use of telemedicine in my medical care

## 2023-05-30 NOTE — Telephone Encounter (Signed)
Pt has been scheduled tele pre op appt 06/08/23. Pt states surgeon office will not schedule surgery until she has been cleared. Med rec and consent are done.

## 2023-06-07 ENCOUNTER — Other Ambulatory Visit: Payer: Self-pay | Admitting: Physician Assistant

## 2023-06-08 ENCOUNTER — Ambulatory Visit: Payer: Medicare Other | Attending: Nurse Practitioner

## 2023-06-08 DIAGNOSIS — Z01810 Encounter for preprocedural cardiovascular examination: Secondary | ICD-10-CM | POA: Diagnosis not present

## 2023-06-08 NOTE — Progress Notes (Signed)
Virtual Visit via Telephone Note   Because of VAIDA KERCHNER co-morbid illnesses, she is at least at moderate risk for complications without adequate follow up.  This format is felt to be most appropriate for this patient at this time.  The patient did not have access to video technology/had technical difficulties with video requiring transitioning to audio format only (telephone).  All issues noted in this document were discussed and addressed.  No physical exam could be performed with this format.  Please refer to the patient's chart for her consent to telehealth for [facility].  Evaluation Performed:  Preoperative cardiovascular risk assessment _____________   Date:  06/08/2023   Patient ID:  Elizabeth Gray, Elizabeth Gray 08/07/1940, MRN [id number] Patient Location:  Home Provider location:   Office  Primary Care Provider:  Tresa Garter, MD Primary Cardiologist:  Rollene Rotunda, MD  Chief Complaint / Patient Profile   82 y.o. y/o female with a h/o palpitations, GERD, elevated calcium score, aortic atherosclerosis, RBBB who is pending right shoulder scope and presents today for telephonic preoperative cardiovascular risk assessment.  History of Present Illness    Elizabeth Gray is a 82 y.o. female who presents via audio/video conferencing for a telehealth visit today.  Pt was last seen in cardiology clinic on 10/21/2022 by Dr. Antoine Poche.  At that time Elizabeth Gray was doing well with no palpitations or chest pain.  The patient is now pending procedure as outlined above. Since her last visit, she has been doing well and stays very active.  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  She reports no deficits or chest pain with any of her activities that she completes weekly.  Her blood pressures have been stable at 120/70.  She  denies chest pain, shortness of breath, lower extremity edema, fatigue, palpitations, melena, hematuria, hemoptysis, diaphoresis, weakness, presyncope, syncope, orthopnea, and PND.   Past Medical History    Past Medical History:  Diagnosis Date   Allergy    Bursitis    Cancer (HCC) 03/22/2017   Melanoma in situ, lentigo maligna type   Deviated septum    External hemorrhoids without mention of complication 2008   Colonoscopy    Fatigue    GERD (gastroesophageal reflux disease)    Headache(784.0)    migraines   Heat rash    under the breasts.Marland Kitchenappeared on monday.Marland KitchenMarland KitchenBurning & itching, uses cortisone   Hypothyroidism    Iron deficiency anemia, unspecified    Lower esophageal ring 2008   EGD   Nonorganic sleep disorder, unspecified    Osteoarthritis    Osteoporosis    Pancreatitis    Personal history of colonic polyps    Polio 1948   Pulmonary sarcoidosis (HCC)    Sleep apnea    cpap [facility] 09 [facility]   Vitamin B12 deficiency    Vitamin D deficiency    Past Surgical History:  Procedure Laterality Date   APPENDECTOMY  1988   CARPAL TUNNEL RELEASE  2002   rt   CHOLECYSTECTOMY  2000   COLONOSCOPY  12/19/2006   Multiple diminutive polyps destroyed-removed (hyperpastic), internal hemorrhoids   ESOPHAGOGASTRODUODENOSCOPY  12/19/2006   lower esophageal ring dilated   HAND SURGERY  1997   left   KNEE ARTHROSCOPY Bilateral    multiple 2 on lft 1 on  rt   LUMBAR DISC SURGERY  1995   Melanoma Removal  Right 2018   right face   ROTATOR CUFF REPAIR Left    Nov 29, 2021   TOE SURGERY Left    left foot next to little toe  joint rem   TOTAL KNEE ARTHROPLASTY Right 05/09/2014   Procedure: RIGHT TOTAL KNEE ARTHROPLASTY;  Surgeon: Verlee Rossetti, MD;  Location[facility] OR;  Service: Orthopedics;  Laterality: Right;   TOTAL KNEE ARTHROPLASTY Left 08/06/2021   Procedure: TOTAL KNEE ARTHROPLASTY;  Surgeon: Beverely Low, MD;  Location: [facility] ORS;  Service: Orthopedics;   Laterality: Left;   UPPER GASTROINTESTINAL ENDOSCOPY     WISDOM TOOTH EXTRACTION      Allergies  Allergies  Allergen Reactions   Dexilant [Dexlansoprazole] Shortness Of Breath    "set my stomach on fire and made me SOB"   Dayquil Severe + Vapocool [Phenylephrine-Dm-Gg-Apap] Other (See Comments)    Hallucinations    Gabapentin     "Music apraxia"   Metoclopramide Hcl Other (See Comments)    REGLAN="burning in mouth"   Oxymetazoline Hcl Itching    AFRIN SPRAY   Restasis [Cyclosporine]     "can't use it"   Sucralfate Other (See Comments)    CARAFATE=Mouth sores   Acetaminophen Other (See Comments)   Dm-Doxylamine-Acetaminophen Other (See Comments)    Vivid/Bizarre Dreams    Vicks Dayquil Hbp Cold & Flu [Acetaminophen-Dm] Other (See Comments)    Hallucination/crazy dreams    Home Medications    Prior to Admission medications   Medication Sig Start Date End Date Taking? Authorizing Provider  Alum Hydroxide-Mag Carbonate (GAVISCON PO) Take 1 tablet by mouth 3 (three) times daily after meals.    [provider]  ASPIRIN 81 PO Take by mouth.    [provider]  Cholecalciferol (VITAMIN D PO) Take 1,000 Units by mouth every other day.     [provider]  cyanocobalamin (VITAMIN B12) 1000 MCG/ML injection INJECT 1 ML INTRAMUSCULARLY ONCE EVERY 14 DAYS 01/19/23   Plotnikov, Georgina Quint, MD  Flaxseed, Linseed, (FLAXSEED OIL) 1000 MG CAPS Take 1,000 mg by mouth daily.    [provider]  furosemide (LASIX) 20 MG tablet TAKE 1 TABLET BY MOUTH ONCE DAILY AS NEEDED 12/20/22   Plotnikov, Georgina Quint, MD  Glucosamine-Chondroitin (MOVE FREE PO) Take 1 tablet by mouth in the morning and at bedtime.    [provider]  levothyroxine (SYNTHROID) 100 MCG tablet Take 1 tablet by mouth once daily 05/09/23   Plotnikov, Georgina Quint, MD  metoprolol succinate (TOPROL-XL) 25 MG 24 hr tablet Take 1 tablet (25 mg total) by mouth daily. 03/10/23   Rollene Rotunda, MD   Multiple Vitamins-Minerals (ICAPS AREDS FORMULA PO) Take 1 capsule by mouth in the morning and at bedtime.    [provider]  nitroGLYCERIN (NITROSTAT) 0.4 MG SL tablet Place 1 tablet (0.4 mg total) under the tongue every 5 (five) minutes as needed for chest pain. 08/29/22   Plotnikov, Georgina Quint, MD  omeprazole-sodium bicarbonate (ZEGERID) 40-1100 MG capsule TAKE 1 CAPSULE BY MOUTH ONCE DAILY BEFORE BREAKFAST 05/11/23   Doree Albee, PA-C  Polyethyl Glycol-Propyl Glycol 0.4-0.3 % SOLN Place 2-3 drops into both eyes 2 (two) times daily as needed (dry eyes).    [provider]  pramipexole (MIRAPEX) 1 MG tablet Take 2 tablets (2 mg total) by mouth at bedtime. 08/29/22   Plotnikov, Georgina Quint, MD  pravastatin (PRAVACHOL) 20 MG tablet Take 1  tablet (20 mg total) by mouth daily. 12/22/22   Plotnikov, Georgina Quint, MD  SYRINGE-NEEDLE, DISP, 3 ML (B-D 3CC LUER-LOK SYR 25GX1") 25G X 1" 3 ML MISC USE AS DIRECTED FOR  B12  INJECTIONS 03/17/23   Plotnikov, Georgina Quint, MD  traMADol (ULTRAM) 50 MG tablet Take 1-2 tablets (50-100 mg total) by mouth every 6 (six) hours as needed for moderate pain or severe pain. 08/06/21   Beverely Low, MD  zolpidem (AMBIEN) 10 MG tablet TAKE 1 TABLET BY MOUTH AT BEDTIME AS NEEDED FOR SLEEP Patient not taking: Reported on 05/30/2023 01/30/23   Eulis Foster, FNP    Physical Exam    Vital Signs:  Elizabeth Gray does not have vital signs available for review today.120/70  Given telephonic nature of communication, physical exam is limited. AAOx3. NAD. Normal affect.  Speech and respirations are unlabored.  Accessory Clinical Findings    None  Assessment & Plan    1.  Preoperative Cardiovascular Risk Assessment: -Patient's RCRI score is 0.9%  The patient affirms she has been doing well without any new cardiac symptoms. They are able to achieve 7 METS without cardiac limitations. Therefore, based on ACC/AHA guidelines, the patient would be at acceptable  risk for the planned procedure without further cardiovascular testing. The patient was advised that if she develops new symptoms prior to surgery to contact our office to arrange for a follow-up visit, and she verbalized understanding.   The patient was advised that if she develops new symptoms prior to surgery to contact our office to arrange for a follow-up visit, and she verbalized understanding.  Patient can hold ASA 81 mg 5 to 7 days prior to procedure  A copy of this note will be routed to requesting surgeon.  Time:   Today, I have spent 7 minutes with the patient with telehealth technology discussing medical history, symptoms, and management plan.     Napoleon Form, Leodis Rains, NP  06/08/2023, 7:48 AM

## 2023-06-10 ENCOUNTER — Other Ambulatory Visit: Payer: Self-pay | Admitting: Internal Medicine

## 2023-06-17 ENCOUNTER — Other Ambulatory Visit: Payer: Self-pay | Admitting: Internal Medicine

## 2023-06-19 ENCOUNTER — Encounter: Payer: Self-pay | Admitting: Internal Medicine

## 2023-06-19 ENCOUNTER — Ambulatory Visit (INDEPENDENT_AMBULATORY_CARE_PROVIDER_SITE_OTHER): Payer: Medicare Other | Admitting: Internal Medicine

## 2023-06-19 VITALS — BP 110/68 | HR 71 | Temp 98.0°F | Ht 63.75 in | Wt 177.0 lb

## 2023-06-19 DIAGNOSIS — R609 Edema, unspecified: Secondary | ICD-10-CM | POA: Diagnosis not present

## 2023-06-19 DIAGNOSIS — Z01818 Encounter for other preprocedural examination: Secondary | ICD-10-CM | POA: Diagnosis not present

## 2023-06-19 DIAGNOSIS — E039 Hypothyroidism, unspecified: Secondary | ICD-10-CM | POA: Diagnosis not present

## 2023-06-19 DIAGNOSIS — M75101 Unspecified rotator cuff tear or rupture of right shoulder, not specified as traumatic: Secondary | ICD-10-CM

## 2023-06-19 DIAGNOSIS — G2581 Restless legs syndrome: Secondary | ICD-10-CM | POA: Diagnosis not present

## 2023-06-19 DIAGNOSIS — T148XXA Other injury of unspecified body region, initial encounter: Secondary | ICD-10-CM

## 2023-06-19 DIAGNOSIS — I2583 Coronary atherosclerosis due to lipid rich plaque: Secondary | ICD-10-CM

## 2023-06-19 DIAGNOSIS — E538 Deficiency of other specified B group vitamins: Secondary | ICD-10-CM

## 2023-06-19 LAB — COMPREHENSIVE METABOLIC PANEL
ALT: 14 U/L (ref 0–35)
AST: 21 U/L (ref 0–37)
Albumin: 4.1 g/dL (ref 3.5–5.2)
Alkaline Phosphatase: 99 U/L (ref 39–117)
BUN: 22 mg/dL (ref 6–23)
CO2: 30 meq/L (ref 19–32)
Calcium: 9.5 mg/dL (ref 8.4–10.5)
Chloride: 101 meq/L (ref 96–112)
Creatinine, Ser: 0.94 mg/dL (ref 0.40–1.20)
GFR: 55.69 mL/min — ABNORMAL LOW (ref >60.00)
Glucose, Bld: 104 mg/dL — ABNORMAL HIGH (ref 70–99)
Potassium: 4.5 meq/L (ref 3.5–5.1)
Sodium: 139 meq/L (ref 135–145)
Total Bilirubin: 0.5 mg/dL (ref 0.2–1.2)
Total Protein: 7.4 g/dL (ref 6.0–8.3)

## 2023-06-19 LAB — LIPID PANEL
Cholesterol: 120 mg/dL (ref 0–200)
HDL: 53 mg/dL (ref >39.00)
LDL Cholesterol: 49 mg/dL (ref 0–99)
NonHDL: 66.66
Total CHOL/HDL Ratio: 2
Triglycerides: 87 mg/dL (ref 0.0–149.0)
VLDL: 17.4 mg/dL (ref 0.0–40.0)

## 2023-06-19 LAB — CBC WITH DIFFERENTIAL/PLATELET
Basophils Absolute: 0.1 10*3/uL (ref 0.0–0.1)
Basophils Relative: 0.7 % (ref 0.0–3.0)
Eosinophils Absolute: 0.3 10*3/uL (ref 0.0–0.7)
Eosinophils Relative: 3.3 % (ref 0.0–5.0)
HCT: 38.1 % (ref 36.0–46.0)
Hemoglobin: 12.4 g/dL (ref 12.0–15.0)
Lymphocytes Relative: 30.2 % (ref 12.0–46.0)
Lymphs Abs: 2.6 10*3/uL (ref 0.7–4.0)
MCHC: 32.5 g/dL (ref 30.0–36.0)
MCV: 83 fL (ref 78.0–100.0)
Monocytes Absolute: 0.9 10*3/uL (ref 0.1–1.0)
Monocytes Relative: 11 % (ref 3.0–12.0)
Neutro Abs: 4.7 10*3/uL (ref 1.4–7.7)
Neutrophils Relative %: 54.8 % (ref 43.0–77.0)
Platelets: 352 10*3/uL (ref 150.0–400.0)
RBC: 4.59 Mil/uL (ref 3.87–5.11)
RDW: 15.7 % — ABNORMAL HIGH (ref 11.5–15.5)
WBC: 8.6 10*3/uL (ref 4.0–10.5)

## 2023-06-19 LAB — URINALYSIS
Bilirubin Urine: NEGATIVE
Hgb urine dipstick: NEGATIVE
Leukocytes,Ua: NEGATIVE
Nitrite: NEGATIVE
Specific Gravity, Urine: 1.02 (ref 1.000–1.030)
Urine Glucose: NEGATIVE
Urobilinogen, UA: 0.2 (ref 0.0–1.0)
pH: 7 (ref 5.0–8.0)

## 2023-06-19 LAB — PROTIME-INR
INR: 1.2 {ratio} — ABNORMAL HIGH (ref 0.8–1.0)
Prothrombin Time: 12.5 s (ref 9.6–13.1)

## 2023-06-19 LAB — VITAMIN B12: Vitamin B-12: 505 pg/mL (ref 211–911)

## 2023-06-19 LAB — T4, FREE: Free T4: 0.6 ng/dL (ref 0.60–1.60)

## 2023-06-19 MED ORDER — UNDERPADS EXTRA LARGE MISC: 11 refills | Status: DC

## 2023-06-19 NOTE — Assessment & Plan Note (Signed)
Furosemide prn NAS diet Use compr socks

## 2023-06-19 NOTE — Assessment & Plan Note (Signed)
Chronic On Levothyroxine 

## 2023-06-19 NOTE — Assessment & Plan Note (Signed)
On B12 

## 2023-06-19 NOTE — Assessment & Plan Note (Signed)
Cont w/ Mirapex

## 2023-06-19 NOTE — Assessment & Plan Note (Addendum)
R shoulder cuff tear - needs surgery clearance: Elizabeth Bible is clear for surgery (Dr Ranell Patrick) Cardiology has cleared Elizabeth Bible already

## 2023-06-19 NOTE — Assessment & Plan Note (Signed)
Cont on Pravastatin, ASA, metoprolol No angina

## 2023-06-19 NOTE — Progress Notes (Signed)
Subjective:  Patient ID: [name], female    DOB: 17-Nov-1940  Age: 82 y.o. MRN: [id number]  CC: Medical Management of Chronic Issues (3 MNTH F/U )   HPI KADYN PRIZZI presents for R shoulder cuff tear - needs surgery clearance Req by Dr Ranell Patrick F/u CAD, dyslipidemia, OA, RLS  Outpatient Medications Prior to Visit  Medication Sig Dispense Refill   Alum Hydroxide-Mag Carbonate (GAVISCON PO) Take 1 tablet by mouth 3 (three) times daily after meals.     ASPIRIN 81 PO Take by mouth.     B-D 3CC LUER-LOK SYR 25GX1" 25G X 1" 3 ML MISC USE AS DIRECTED FOR B12 INJECTIONS 12 each 0   Cholecalciferol (VITAMIN D PO) Take 1,000 Units by mouth every other day.      cyanocobalamin (VITAMIN B12) 1000 MCG/ML injection INJECT 1 ML INTRAMUSCULARLY ONCE EVERY 14 DAYS 6 mL 11   Flaxseed, Linseed, (FLAXSEED OIL) 1000 MG CAPS Take 1,000 mg by mouth daily.     furosemide (LASIX) 20 MG tablet TAKE 1 TABLET BY MOUTH ONCE DAILY AS NEEDED 30 tablet 5   Glucosamine-Chondroitin (MOVE FREE PO) Take 1 tablet by mouth in the morning and at bedtime.     levothyroxine (SYNTHROID) 100 MCG tablet Take 1 tablet by mouth once daily 90 tablet 3   metoprolol succinate (TOPROL-XL) 25 MG 24 hr tablet Take 1 tablet (25 mg total) by mouth daily. 90 tablet 3   Multiple Vitamins-Minerals (ICAPS AREDS FORMULA PO) Take 1 capsule by mouth in the morning and at bedtime.     nitroGLYCERIN (NITROSTAT) 0.4 MG SL tablet Place 1 tablet (0.4 mg total) under the tongue every 5 (five) minutes as needed for chest pain. 20 tablet 3   omeprazole-sodium bicarbonate (ZEGERID) 40-1100 MG capsule TAKE 1 CAPSULE BY MOUTH ONCE DAILY BEFORE BREAKFAST 30 capsule 0   Polyethyl Glycol-Propyl Glycol 0.4-0.3 % SOLN Place 2-3 drops into both eyes 2 (two) times daily as needed (dry eyes).     pramipexole (MIRAPEX) 1 MG tablet Take 2 tablets (2 mg total) by mouth at bedtime. 180 tablet 3   pravastatin (PRAVACHOL) 20 MG tablet Take 1 tablet (20 mg  total) by mouth daily. 90 tablet 1   traMADol (ULTRAM) 50 MG tablet Take 1-2 tablets (50-100 mg total) by mouth every 6 (six) hours as needed for moderate pain or severe pain. 40 tablet 0   zolpidem (AMBIEN) 10 MG tablet TAKE 1 TABLET BY MOUTH AT BEDTIME AS NEEDED FOR SLEEP (Patient not taking: Reported on 05/30/2023) 30 tablet 0   No facility-administered medications prior to visit.    ROS: Review of Systems  Constitutional:  Positive for unexpected weight change. Negative for activity change, appetite change, chills and fatigue.  HENT:  Negative for congestion, mouth sores and sinus pressure.   Eyes:  Negative for visual disturbance.  Respiratory:  Negative for cough and chest tightness.   Cardiovascular:  Positive for leg swelling. Negative for chest pain and palpitations.  Gastrointestinal:  Negative for abdominal pain and nausea.  Genitourinary:  Negative for difficulty urinating, frequency and vaginal pain.  Musculoskeletal:  Negative for back pain and gait problem.  Skin:  Negative for pallor and rash.  Neurological:  Negative for dizziness, tremors, weakness, numbness and headaches.  Psychiatric/Behavioral:  Negative for confusion and sleep disturbance.     Objective:  BP 110/68 (BP Location: Left Arm, Patient Position: Sitting, Cuff Size: Normal)   Pulse 71   Temp 98 F (36.7  C) (Oral)   Ht 5' 3.75" (1.619 m)   Wt 177 lb (80.3 kg)   SpO2 95%   BMI 30.62 kg/m   BP Readings from Last 3 Encounters:  06/19/23 110/68  03/06/23 118/60  11/28/22 118/70    Wt Readings from Last 3 Encounters:  06/19/23 177 lb (80.3 kg)  03/06/23 162 lb (73.5 kg)  03/03/23 153 lb (69.4 kg)    Physical Exam Constitutional:      General: She is not in acute distress.    Appearance: She is well-developed.  HENT:     Head: Normocephalic.     Right Ear: External ear normal.     Left Ear: External ear normal.     Nose: Nose normal.  Eyes:     General:        Right eye: No discharge.         Left eye: No discharge.     Conjunctiva/sclera: Conjunctivae normal.     Pupils: Pupils are equal, round, and reactive to light.  Neck:     Thyroid: No thyromegaly.     Vascular: No JVD.     Trachea: No tracheal deviation.  Cardiovascular:     Rate and Rhythm: Normal rate and regular rhythm.     Heart sounds: Normal heart sounds.  Pulmonary:     Effort: No respiratory distress.     Breath sounds: No stridor. No wheezing.  Abdominal:     General: Bowel sounds are normal. There is no distension.     Palpations: Abdomen is soft. There is no mass.     Tenderness: There is no abdominal tenderness. There is no guarding or rebound.  Musculoskeletal:        General: Tenderness present.     Cervical back: Normal range of motion and neck supple. No rigidity.     Right lower leg: No edema.     Left lower leg: No edema.  Lymphadenopathy:     Cervical: No cervical adenopathy.  Skin:    Findings: No erythema or rash.  Neurological:     Cranial Nerves: No cranial nerve deficit.     Motor: No abnormal muscle tone.     Coordination: Coordination normal.     Deep Tendon Reflexes: Reflexes normal.  Psychiatric:        Behavior: Behavior normal.        Thought Content: Thought content normal.        Judgment: Judgment normal.   Trace edema B  Lab Results  Component Value Date   WBC 10.3 08/29/2022   HGB 14.0 08/29/2022   HCT 41.2 08/29/2022   PLT 356.0 08/29/2022   GLUCOSE 117 (H) 03/06/2023   CHOL 131 03/06/2023   TRIG 86.0 03/06/2023   HDL 60.70 03/06/2023   LDLCALC 53 03/06/2023   ALT 19 03/06/2023   AST 27 03/06/2023   NA 139 03/06/2023   K 3.6 03/06/2023   CL 96 03/06/2023   CREATININE 0.93 03/06/2023   BUN 27 (H) 03/06/2023   CO2 34 (H) 03/06/2023   TSH 16.71 (H) 08/29/2022   INR 3.03 (H) 05/12/2014    MR BRAIN W WO CONTRAST  Result Date: 01/26/2022 [facility] NEUROIMAG[facility] REPORT STUDY DATE: 01/26/22 PATIENT NAME: LOUIS LICONA DOB:  1940/10/04 MRN: [id number] ORDERING CLINICIAN: Suanne Marker, MD CLINICAL HISTORY: 82 year old female with loss of consciousness. EXAM: MR BRAIN W WO CONTRAST TECHNIQUE: MRI of the brain with and without contrast was obtained utilizing  multiplanar, multiecho pulse sequences. CONTRAST: 13ml multihance COMPARISON: 10/10/2014 MRI IMAGING SITE: [facility] [address] [place] FINDINGS: No abnormal lesions are seen on diffusion-weighted views to suggest acute ischemia. The cortical sulci, fissures and cisterns are normal in size and appearance. Lateral, third and fourth ventricle are normal in size and appearance. No extra-axial fluid collections are seen. No evidence of mass effect or midline shift.  No abnormal lesions are seen on post contrast views.  On sagittal views the posterior fossa, pituitary gland and corpus callosum are unremarkable. No evidence of intracranial hemorrhage on gradient-echo views. The orbits and their contents, paranasal sinuses and calvarium are notable for mucus thickening in the ethmoid sinuses.  Intracranial flow voids are present.   Unremarkable MRI brain with without contrast.  No acute findings. INTERPRETING PHYSICIAN: Suanne Marker, MD Certified in Neurology, Neurophysiology and Neuroimaging [facility] [address], [address] [place], [place] [address] [phone]    Assessment & Plan:   Problem List Items Addressed This Visit     Hypothyroidism    Chronic  On Levothyroxine      Relevant Medications   Incontinence Supply Disposable (UNDERPADS EXTRA LARGE) MISC   Other Relevant Orders   CBC with Differential/Platelet   Comprehensive metabolic panel   T4, free   Vitamin B12   Protime-INR   Lipid panel   Urinalysis   B12 deficiency    On B12      Coronary atherosclerosis    Cont on Pravastatin, ASA, metoprolol No angina      RLS (restless legs syndrome)    Cont w/ Mirapex      Edema     Furosemide prn NAS diet Use compr socks      Relevant Medications   Incontinence Supply Disposable (UNDERPADS EXTRA LARGE) MISC   Other Relevant Orders   CBC with Differential/Platelet   Comprehensive metabolic panel   T4, free   Vitamin B12   Protime-INR   Lipid panel   Urinalysis   Preop exam for internal medicine - Primary    R shoulder cuff tear - needs surgery clearance: Dennie Bible is clear for surgery (Dr Ranell Patrick) Cardiology has cleared Dennie Bible already       Relevant Medications   Incontinence Supply Disposable (UNDERPADS EXTRA LARGE) MISC   Other Relevant Orders   CBC with Differential/Platelet   Comprehensive metabolic panel   T4, free   Vitamin B12   Protime-INR   Lipid panel   Urinalysis   Other Visit Diagnoses     Bruising       Relevant Orders   Protime-INR         Meds ordered this encounter  Medications   Incontinence Supply Disposable (UNDERPADS EXTRA LARGE) MISC    Sig: As directed    Dispense:  108 each    Refill:  11      Follow-up: No follow-ups on file.  Sonda Primes, MD

## 2023-06-20 ENCOUNTER — Encounter: Payer: Self-pay | Admitting: Internal Medicine

## 2023-06-27 ENCOUNTER — Other Ambulatory Visit: Payer: Self-pay | Admitting: Physician Assistant

## 2023-07-13 ENCOUNTER — Other Ambulatory Visit: Payer: Self-pay | Admitting: Internal Medicine

## 2023-07-28 ENCOUNTER — Other Ambulatory Visit: Payer: Self-pay | Admitting: Physician Assistant

## 2023-08-17 ENCOUNTER — Other Ambulatory Visit: Payer: Self-pay | Admitting: Internal Medicine

## 2023-08-28 DIAGNOSIS — Y939 Activity, unspecified: Secondary | ICD-10-CM | POA: Diagnosis not present

## 2023-08-28 DIAGNOSIS — Y929 Unspecified place or not applicable: Secondary | ICD-10-CM | POA: Diagnosis not present

## 2023-08-28 DIAGNOSIS — X58XXXA Exposure to other specified factors, initial encounter: Secondary | ICD-10-CM | POA: Diagnosis not present

## 2023-08-28 DIAGNOSIS — M24111 Other articular cartilage disorders, right shoulder: Secondary | ICD-10-CM | POA: Diagnosis not present

## 2023-08-28 DIAGNOSIS — S46011A Strain of muscle(s) and tendon(s) of the rotator cuff of right shoulder, initial encounter: Secondary | ICD-10-CM | POA: Diagnosis not present

## 2023-08-28 DIAGNOSIS — M19011 Primary osteoarthritis, right shoulder: Secondary | ICD-10-CM | POA: Diagnosis not present

## 2023-08-28 DIAGNOSIS — Y999 Unspecified external cause status: Secondary | ICD-10-CM | POA: Diagnosis not present

## 2023-08-28 DIAGNOSIS — G8918 Other acute postprocedural pain: Secondary | ICD-10-CM | POA: Diagnosis not present

## 2023-08-28 DIAGNOSIS — M7551 Bursitis of right shoulder: Secondary | ICD-10-CM | POA: Diagnosis not present

## 2023-08-28 DIAGNOSIS — S43431A Superior glenoid labrum lesion of right shoulder, initial encounter: Secondary | ICD-10-CM | POA: Diagnosis not present

## 2023-08-28 HISTORY — PX: SHOULDER SURGERY: SHX246

## 2023-08-31 ENCOUNTER — Ambulatory Visit: Payer: Medicare Other | Attending: Orthopedic Surgery | Admitting: Physical Therapy

## 2023-08-31 ENCOUNTER — Other Ambulatory Visit: Payer: Self-pay

## 2023-08-31 DIAGNOSIS — M25611 Stiffness of right shoulder, not elsewhere classified: Secondary | ICD-10-CM | POA: Insufficient documentation

## 2023-08-31 DIAGNOSIS — M25511 Pain in right shoulder: Secondary | ICD-10-CM | POA: Insufficient documentation

## 2023-08-31 DIAGNOSIS — G8929 Other chronic pain: Secondary | ICD-10-CM | POA: Diagnosis not present

## 2023-08-31 DIAGNOSIS — R600 Localized edema: Secondary | ICD-10-CM | POA: Insufficient documentation

## 2023-08-31 NOTE — Therapy (Signed)
OUTPATIENT PHYSICAL THERAPY SHOULDER EVALUATION   Patient Name: Elizabeth Gray MRN: [id number] DOB:[birth year], 83 y.o., female Today's Date: 08/31/2023  END OF SESSION:  PT End of Session - 08/31/23 1207     Visit Number 1    Number of Visits 12    Date for PT Re-Evaluation 10/12/23    PT Start Time 0934    PT Stop Time 1022    PT Time Calculation (min) 48 min    Activity Tolerance Patient tolerated treatment well    Behavior During Therapy [facility] for tasks assessed/performed             Past Medical History:  Diagnosis Date   Allergy    Bursitis    Cancer (HCC) 03/22/2017   Melanoma in situ, lentigo maligna type   Deviated septum    External hemorrhoids without mention of complication 2008   Colonoscopy    Fatigue    GERD (gastroesophageal reflux disease)    Headache(784.0)    migraines   Heat rash    under the breasts.Marland Kitchenappeared on monday.Marland KitchenMarland KitchenBurning & itching, uses cortisone   Hypothyroidism    Iron deficiency anemia, unspecified    Lower esophageal ring 2008   EGD   Nonorganic sleep disorder, unspecified    Osteoarthritis    Osteoporosis    Pancreatitis    Personal history of colonic polyps    Polio 1948   Pulmonary sarcoidosis (HCC)    Sleep apnea    [facility]ap [facility]   Vitamin B12 deficiency    Vitamin D deficiency    Past Surgical History:  Procedure Laterality Date   APPENDECTOMY  1988   CARPAL TUNNEL RELEASE  2002   rt   CHOLECYSTECTOMY  2000   COLONOSCOPY  12/19/2006   Multiple diminutive polyps destroyed-removed (hyperpastic), internal hemorrhoids   ESOPHAGOGASTRODUODENOSCOPY  12/19/2006   lower esophageal ring dilated   HAND SURGERY  1997   left   KNEE ARTHROSCOPY Bilateral    multiple 2 on lft 1 on rt   LUMBAR DISC SURGERY  1995   Melanoma Removal  Right 2018   right face   ROTATOR CUFF REPAIR Left    Nov 29, 2021   TOE SURGERY Left    left foot next to little toe  joint rem   TOTAL KNEE ARTHROPLASTY Right  05/09/2014   Procedure: RIGHT TOTAL KNEE ARTHROPLASTY;  Surgeon: Verlee Rossetti, MD;  Location: [facility] OR;  Service: Orthopedics;  Laterality: Right;   TOTAL KNEE ARTHROPLASTY Left 08/06/2021   Procedure: TOTAL KNEE ARTHROPLASTY;  Surgeon: Beverely Low, MD;  Location: [facility];  Service: Orthopedics;  Laterality: Left;   UPPER GASTROINTESTINAL ENDOSCOPY     WISDOM TOOTH EXTRACTION     Patient Active Problem List   Diagnosis Date Noted   Preop exam for internal medicine 06/19/2023   Aortic atherosclerosis (HCC) 10/19/2022   Elevated coronary artery calcium score 10/19/2022   Edema 08/29/2022   Palpitations 01/24/2022   Abdominal hernia 11/24/2021   Anemia 11/24/2021   Asthma 11/24/2021   Decreased estrogen level 11/24/2021   Syncope 11/24/2021   Sphenoid sinusitis 11/24/2021   Rotator cuff tear 11/22/2021   Full thickness rotator cuff tear 09/30/2021   Insomnia 09/23/2020   Dyslipidemia 09/23/2020   RLS (restless legs syndrome) 07/20/2020   Drug side effects, initial encounter 07/20/2020   Coronary atherosclerosis 03/18/2019   Post-traumatic headache, not intractable 12/04/2018   Grief 09/12/2018   Atrophic vaginitis 09/06/2017   Burning  sensation of mouth 09/06/2017   Osteopenia 09/05/2016   Upper respiratory infection 09/05/2016   Chest pain, atypical 03/04/2016   Headache 07/19/2013   Tinnitus 07/19/2013   Dry skin dermatitis 08/31/2012   Left hip pain 08/31/2012   Cerumen impaction 08/31/2012   Well adult exam 05/25/2012   Upper abdominal pain-chronic 04/02/2011   Thumb pain 03/11/2011   Shoulder pain 03/11/2011   Bilateral primary osteoarthritis of knee 09/10/2010   VERTIGO 03/05/2010   OSA on CPAP 07/22/2008   B12 deficiency 08/16/2007   Vitamin D deficiency 08/16/2007   Nonorganic sleep disorder 05/10/2007   SARCOIDOSIS, PULMONARY 05/09/2007   Hypothyroidism 05/09/2007   Allergic rhinitis 05/09/2007   GERD 05/09/2007   History of colonic polyps 05/09/2007    REFERRING PROVIDER: Malon Kindle MD  REFERRING DIAG: S/p right RTC repair.  THERAPY DIAG:  Chronic right shoulder pain  Stiffness of right shoulder, not elsewhere classified  Localized edema  Rationale for Evaluation and Treatment: Rehabilitation  ONSET DATE: 08/28/23 (surgery date).  SUBJECTIVE:                                                                                                                                                                                      SUBJECTIVE STATEMENT: The patient presents to the clinic s/p right shoulder RTC repair.  She is wearing her sling with abduction pillow intact.  Sh changed her dressing and has sterile gauze over her incisional site.  Her resting pain-level is a 3/10 and higher with movement.  She is doing the pendulum exercises multiple times a day.  Medication decreases her pain.    PERTINENT HISTORY: See above.  PAIN:  Are you having pain? Yes: NPRS scale: 3/10. Pain location: Right shoulder. Pain description: Sharp. Aggravating factors: Movement. Relieving factors: Medication.  PRECAUTIONS: Other: Progress per protocol.      WEIGHT BEARING RESTRICTIONS:  No right UE weight bearing.    FALLS:  Has patient fallen in last 6 months? No  LIVING ENVIRONMENT: Lives in: House/apartment Has following equipment at home: None  OCCUPATION: "Semi-Retired."  PLOF: Independent  PATIENT GOALS:Use right UE without pain.  NEXT MD VISIT:   OBJECTIVE:  Note: Objective measures were completed at Evaluation unless otherwise noted.  UPPER EXTREMITY ROM:   In supine:  Gentle right shoulder PROM to 75 degrees of flexion and ER to 10 degrees.  PALPATION:  Right shoulder incision cover with sterile gauze (patient's performed for her).  She c/o diffuse shoulder pain currently and her shoulder is visibly swollen.  TREATMENT DATE: 08/31/23:  Vasopneumatic on low x 15 minutes with pillow between right elbow ans thorax.     PATIENT EDUCATION: Education details: See below (this exercise was part of her hospital discharge packet). Person educated: Patient Education method: Explanation, Demonstration, Tactile cues, Verbal cues, and Handouts Education comprehension: verbalized understanding and returned demonstration  HOME EXERCISE PROGRAM: HOME EXERCISE PROGRAM Created by [place] Alannah Averhart Jan 30th, 2025 View at [email or web address] using code: [email or web address]  Page 1 of 1 1 Exercise WAND EXTERNAL ROTATION - SUPINE ER Lie on your back holding a cane or wand with both hands.  On the affected side, place a small rolled up towel or pillow under your elbow. Maintain approx. 90 degree bend at the elbow with your arm approximately 30-45 degrees away from your side. GENTLE. PAIN-FREE Use your other arm to pull the wand/cane to rotate the affected arm back into a stretch. Hold and then return to starting position and then repeat. Repeat 10 Times Hold 15 Seconds Complete 1 Set Perform 4 Times a Day  ASSESSMENT:  CLINICAL IMPRESSION: The patient presents to OPPT s/p right shoulder RTC performed on 08/28/23.  She is compliant to sling usage and is performing the pendulum.  She has restricted motion has expected.  Her right shoulder incision is covered in a sterile gauze bandage. Her shoulder is visibly swollen.   Patient will benefit from skilled physical therapy intervention to address pain and deficits.  OBJECTIVE IMPAIRMENTS: decreased activity tolerance, decreased ROM, increased edema, and pain.   ACTIVITY LIMITATIONS: carrying, lifting, and reach over head  PARTICIPATION LIMITATIONS: meal prep, cleaning, and laundry  REHAB POTENTIAL: Excellent  CLINICAL DECISION MAKING: Stable/uncomplicated  EVALUATION COMPLEXITY: Low   GOALS:   Target date:  09/14/23.             PT LONG TERM GOAL #1     Title independent with a HEP.     Time 4     Period Weeks     Status New          PT LONG TERM GOAL #2    Title Active right  shoulder flexion to 145 degrees so the patient can easily reach overhead.     Time 4     Period Weeks     Status New          PT LONG TERM GOAL #3    Title Active ER to 70 degrees+ to allow for easily donning/doffing of apparel.     Time 4     Period Weeks     Status New          PT LONG TERM GOAL #4    Title Increase ROM so patient is able to reach behind back to L3.     Time 4     Period Weeks     Status New          PT LONG TERM GOAL #5    Title Increase right shoulder strength to a solid 4+/5 to increase stability for performance of functional activities.     Time 4     Period Weeks     Status New          PT LONG TERM GOAL #6    Title Perform ADL's with pain not > 2-3/10.     Time 6     Period Weeks     Status New       PLAN:  PT FREQUENCY: 2x/week  PT DURATION: 6 months  PLANNED INTERVENTIONS: 97110-Therapeutic exercises, 97530- Therapeutic activity, [id number]- Neuromuscular re-education, 97535- Self Care, 16109- Manual therapy, 97014- Electrical stimulation (unattended), 97016- Vasopneumatic device, Cryotherapy, and Moist heat  PLAN FOR NEXT SESSION: Begin with P, P/AROM, asopneumatic on low with pillow between thorax and elbow.    Lamona Eimer, [place], PT 08/31/2023, 12:24 PM

## 2023-09-04 ENCOUNTER — Ambulatory Visit: Payer: Medicare Other | Attending: Orthopedic Surgery | Admitting: *Deleted

## 2023-09-04 ENCOUNTER — Encounter: Payer: Self-pay | Admitting: *Deleted

## 2023-09-04 DIAGNOSIS — G8929 Other chronic pain: Secondary | ICD-10-CM | POA: Diagnosis not present

## 2023-09-04 DIAGNOSIS — M25611 Stiffness of right shoulder, not elsewhere classified: Secondary | ICD-10-CM | POA: Diagnosis not present

## 2023-09-04 DIAGNOSIS — R600 Localized edema: Secondary | ICD-10-CM | POA: Diagnosis not present

## 2023-09-04 DIAGNOSIS — M25511 Pain in right shoulder: Secondary | ICD-10-CM | POA: Diagnosis not present

## 2023-09-04 NOTE — Therapy (Signed)
OUTPATIENT PHYSICAL THERAPY SHOULDER TREATMENT   Patient Name: Elizabeth Gray MRN: [id number] DOB:1941-01-07, 83 y.o., female Today's Date: 09/04/2023  END OF SESSION:  PT End of Session - 09/04/23 1018     Visit Number 2    Number of Visits 12    Date for PT Re-Evaluation 10/12/23    Authorization Type FOTO AT LEAST EVERY 5TH VISIT.  PROGRESS NOTE AT 10TH VISIT.  KX MODIFIER AFTER 15 VISITS.    PT Start Time 1018    PT Stop Time 1103    PT Time Calculation (min) 45 min             Past Medical History:  Diagnosis Date   Allergy    Bursitis    Cancer (HCC) 03/22/2017   Melanoma in situ, lentigo maligna type   Deviated septum    External hemorrhoids without mention of complication 2008   Colonoscopy    Fatigue    GERD (gastroesophageal reflux disease)    Headache(784.0)    migraines   Heat rash    under the breasts.Marland Kitchenappeared on monday.Marland KitchenMarland KitchenBurning & itching, uses cortisone   Hypothyroidism    Iron deficiency anemia, unspecified    Lower esophageal ring 2008   EGD   Nonorganic sleep disorder, unspecified    Osteoarthritis    Osteoporosis    Pancreatitis    Personal history of colonic polyps    Polio 1948   Pulmonary sarcoidosis (HCC)    Sleep apnea    [facility]ap [facility]   Vitamin B12 deficiency    Vitamin D deficiency    Past Surgical History:  Procedure Laterality Date   APPENDECTOMY  1988   CARPAL TUNNEL RELEASE  2002   rt   CHOLECYSTECTOMY  2000   COLONOSCOPY  12/19/2006   Multiple diminutive polyps destroyed-removed (hyperpastic), internal hemorrhoids   ESOPHAGOGASTRODUODENOSCOPY  12/19/2006   lower esophageal ring dilated   HAND SURGERY  1997   left   KNEE ARTHROSCOPY Bilateral    multiple 2 on lft 1 on rt   LUMBAR DISC SURGERY  1995   Melanoma Removal  Right 2018   right face   ROTATOR CUFF REPAIR Left    Nov 29, 2021   TOE SURGERY Left    left foot next to little toe  joint rem   TOTAL KNEE ARTHROPLASTY Right  05/09/2014   Procedure: RIGHT TOTAL KNEE ARTHROPLASTY;  Surgeon: Verlee Rossetti, MD;  Location: [facility] OR;  Service: Orthopedics;  Laterality: Right;   TOTAL KNEE ARTHROPLASTY Left 08/06/2021   Procedure: TOTAL KNEE ARTHROPLASTY;  Surgeon: Beverely Low, MD;  Location: [facility];  Service: Orthopedics;  Laterality: Left;   UPPER GASTROINTESTINAL ENDOSCOPY     WISDOM TOOTH EXTRACTION     Patient Active Problem List   Diagnosis Date Noted   Preop exam for internal medicine 06/19/2023   Aortic atherosclerosis (HCC) 10/19/2022   Elevated coronary artery calcium score 10/19/2022   Edema 08/29/2022   Palpitations 01/24/2022   Abdominal hernia 11/24/2021   Anemia 11/24/2021   Asthma 11/24/2021   Decreased estrogen level 11/24/2021   Syncope 11/24/2021   Sphenoid sinusitis 11/24/2021   Rotator cuff tear 11/22/2021   Full thickness rotator cuff tear 09/30/2021   Insomnia 09/23/2020   Dyslipidemia 09/23/2020   RLS (restless legs syndrome) 07/20/2020   Drug side effects, initial encounter 07/20/2020   Coronary atherosclerosis 03/18/2019   Post-traumatic headache, not intractable 12/04/2018   Grief 09/12/2018   Atrophic vaginitis  09/06/2017   Burning sensation of mouth 09/06/2017   Osteopenia 09/05/2016   Upper respiratory infection 09/05/2016   Chest pain, atypical 03/04/2016   Headache 07/19/2013   Tinnitus 07/19/2013   Dry skin dermatitis 08/31/2012   Left hip pain 08/31/2012   Cerumen impaction 08/31/2012   Well adult exam 05/25/2012   Upper abdominal pain-chronic 04/02/2011   Thumb pain 03/11/2011   Shoulder pain 03/11/2011   Bilateral primary osteoarthritis of knee 09/10/2010   VERTIGO 03/05/2010   OSA on CPAP 07/22/2008   B12 deficiency 08/16/2007   Vitamin D deficiency 08/16/2007   Nonorganic sleep disorder 05/10/2007   SARCOIDOSIS, PULMONARY 05/09/2007   Hypothyroidism 05/09/2007   Allergic rhinitis 05/09/2007   GERD 05/09/2007   History of colonic polyps 05/09/2007    REFERRING PROVIDER: Malon Kindle MD  REFERRING DIAG: S/p right RTC repair.  THERAPY DIAG:  Chronic right shoulder pain  Stiffness of right shoulder, not elsewhere classified  Localized edema  Rationale for Evaluation and Treatment: Rehabilitation  ONSET DATE: 08/28/23 (surgery date).  SUBJECTIVE:                                                                                                                                                                                      SUBJECTIVE STATEMENT: The patient arrives  to the clinic s/p right shoulder RTC repair with abductor pillow/ sling donned PERTINENT HISTORY: See above.  PAIN:  Are you having pain? Yes: NPRS scale: 3/10. Pain location: Right shoulder. Pain description: Sharp. Aggravating factors: Movement. Relieving factors: Medication.  PRECAUTIONS: Other: Progress per protocol.      WEIGHT BEARING RESTRICTIONS:  No right UE weight bearing.    FALLS:  Has patient fallen in last 6 months? No  LIVING ENVIRONMENT: Lives in: House/apartment Has following equipment at home: None  OCCUPATION: "Semi-Retired."  PLOF: Independent  PATIENT GOALS:Use right UE without pain.  NEXT MD VISIT:   OBJECTIVE:  Note: Objective measures were completed at Evaluation unless otherwise noted.  UPPER EXTREMITY ROM:   In supine:  Gentle right shoulder PROM to 75 degrees of flexion and ER to 10 degrees.  PALPATION:  Right shoulder incision cover with sterile gauze (patient's performed for her).  She c/o diffuse shoulder pain currently and her shoulder is visibly swollen.  TREATMENT DATE: 09/04/23 Manual:  PROM/ PAROM for elevation as well as ER with Pt in hook lying x 28 mins f/b review of HEP. Vasopneumatic on low x 10 minutes with pillow between right elbow ans thorax.     PATIENT  EDUCATION: Education details: See below (this exercise was part of her hospital discharge packet). Person educated: Patient Education method: Explanation, Demonstration, Tactile cues, Verbal cues, and Handouts Education comprehension: verbalized understanding and returned demonstration  HOME EXERCISE PROGRAM: HOME EXERCISE PROGRAM Created by [place] Applegate Jan 30th, 2025 View at [email or web address] using code: [email or web address]  Page 1 of 1 1 Exercise WAND EXTERNAL ROTATION - SUPINE ER Lie on your back holding a cane or wand with both hands.  On the affected side, place a small rolled up towel or pillow under your elbow. Maintain approx. 90 degree bend at the elbow with your arm approximately 30-45 degrees away from your side. GENTLE. PAIN-FREE Use your other arm to pull the wand/cane to rotate the affected arm back into a stretch. Hold and then return to starting position and then repeat. Repeat 10 Times Hold 15 Seconds Complete 1 Set Perform 4 Times a Day  ASSESSMENT:  CLINICAL IMPRESSION: The patient arrived to OPPT s/p right shoulder RTC performed on 08/28/23. Manual PROM/ PAROM was performed to RT shldr for elevation and ER and was tolerated fairly well. Vaso end of session RT shldr.     OBJECTIVE IMPAIRMENTS: decreased activity tolerance, decreased ROM, increased edema, and pain.   ACTIVITY LIMITATIONS: carrying, lifting, and reach over head  PARTICIPATION LIMITATIONS: meal prep, cleaning, and laundry  REHAB POTENTIAL: Excellent  CLINICAL DECISION MAKING: Stable/uncomplicated  EVALUATION COMPLEXITY: Low   GOALS:   Target date:  09/14/23.             PT LONG TERM GOAL #1    Title independent with a HEP.     Time 4     Period Weeks     Status New          PT LONG TERM GOAL #2    Title Active right  shoulder flexion to 145 degrees so the patient can easily reach overhead.     Time 4     Period Weeks     Status New          PT LONG TERM GOAL #3    Title Active  ER to 70 degrees+ to allow for easily donning/doffing of apparel.     Time 4     Period Weeks     Status New          PT LONG TERM GOAL #4    Title Increase ROM so patient is able to reach behind back to L3.     Time 4     Period Weeks     Status New          PT LONG TERM GOAL #5    Title Increase right shoulder strength to a solid 4+/5 to increase stability for performance of functional activities.     Time 4     Period Weeks     Status New          PT LONG TERM GOAL #6    Title Perform ADL's with pain not > 2-3/10.     Time 6     Period Weeks     Status New       PLAN:  PT FREQUENCY: 2x/week  PT DURATION: 6 months  PLANNED INTERVENTIONS:  97110-Therapeutic exercises, 97530- Therapeutic activity, [id number]- Neuromuscular re-education, 97535- Self Care, [id number]- Manual therapy, 97014- Electrical stimulation (unattended), 97016- Vasopneumatic device, Cryotherapy, and Moist heat  PLAN FOR NEXT SESSION: Begin with P, P/AROM, vasopneumatic on low with pillow between thorax and elbow.    Montray Kliebert,CHRIS, PTA 09/04/2023, 11:11 AM

## 2023-09-07 ENCOUNTER — Other Ambulatory Visit: Payer: Self-pay | Admitting: Physician Assistant

## 2023-09-07 ENCOUNTER — Encounter: Payer: Medicare Other | Admitting: *Deleted

## 2023-09-08 ENCOUNTER — Ambulatory Visit: Payer: Medicare Other | Admitting: *Deleted

## 2023-09-08 ENCOUNTER — Encounter: Payer: Self-pay | Admitting: *Deleted

## 2023-09-08 DIAGNOSIS — M25611 Stiffness of right shoulder, not elsewhere classified: Secondary | ICD-10-CM

## 2023-09-08 DIAGNOSIS — M25511 Pain in right shoulder: Secondary | ICD-10-CM | POA: Diagnosis not present

## 2023-09-08 DIAGNOSIS — R600 Localized edema: Secondary | ICD-10-CM

## 2023-09-08 DIAGNOSIS — G8929 Other chronic pain: Secondary | ICD-10-CM

## 2023-09-08 NOTE — Therapy (Signed)
 OUTPATIENT PHYSICAL THERAPY SHOULDER TREATMENT   Patient Name: Elizabeth Gray MRN: [id number] DOB:02-12-1941, 83 y.o., female Today's Date: 09/08/2023  END OF SESSION:  PT End of Session - 09/08/23 0841     Visit Number 3    Number of Visits 12    Date for PT Re-Evaluation 10/12/23    Authorization Type FOTO AT LEAST EVERY 5TH VISIT.  PROGRESS NOTE AT 10TH VISIT.  KX MODIFIER AFTER 15 VISITS.    PT Start Time 0800    PT Stop Time 0850    PT Time Calculation (min) 50 min             Past Medical History:  Diagnosis Date   Allergy     Bursitis    Cancer (HCC) 03/22/2017   Melanoma in situ, lentigo maligna type   Deviated septum    External hemorrhoids without mention of complication 2008   Colonoscopy    Fatigue    GERD (gastroesophageal reflux disease)    Headache(784.0)    migraines   Heat rash    under the breasts.SABRAappeared on monday.[name], uses cortisone   Hypothyroidism    Iron deficiency anemia, unspecified    Lower esophageal ring 2008   EGD   Nonorganic sleep disorder, unspecified    Osteoarthritis    Osteoporosis    Pancreatitis    Personal history of colonic polyps    Polio 1948   Pulmonary sarcoidosis (HCC)    Sleep apnea    [facility]   Vitamin B12 deficiency    Vitamin D  deficiency    Past Surgical History:  Procedure Laterality Date   APPENDECTOMY  1988   CARPAL TUNNEL RELEASE  2002   rt   CHOLECYSTECTOMY  2000   COLONOSCOPY  12/19/2006   Multiple diminutive polyps destroyed-removed (hyperpastic), internal hemorrhoids   ESOPHAGOGASTRODUODENOSCOPY  12/19/2006   lower esophageal ring dilated   HAND SURGERY  1997   left   KNEE ARTHROSCOPY Bilateral    multiple 2 on lft 1 on rt   LUMBAR DISC SURGERY  1995   Melanoma Removal  Right 2018   right face   ROTATOR CUFF REPAIR Left    Nov 29, 2021   TOE SURGERY Left    left foot next to little toe  joint rem   TOTAL KNEE ARTHROPLASTY Right  05/09/2014   Procedure: RIGHT TOTAL KNEE ARTHROPLASTY;  Surgeon: Elspeth JONELLE Her, MD;  Location: [facility] OR;  Service: Orthopedics;  Laterality: Right;   TOTAL KNEE ARTHROPLASTY Left 08/06/2021   Procedure: TOTAL KNEE ARTHROPLASTY;  Surgeon: Her Kemps, MD;  Location: [facility];  Service: Orthopedics;  Laterality: Left;   UPPER GASTROINTESTINAL ENDOSCOPY     WISDOM TOOTH EXTRACTION     Patient Active Problem List   Diagnosis Date Noted   Preop exam for internal medicine 06/19/2023   Aortic atherosclerosis (HCC) 10/19/2022   Elevated coronary artery calcium score 10/19/2022   Edema 08/29/2022   Palpitations 01/24/2022   Abdominal hernia 11/24/2021   Anemia 11/24/2021   Asthma 11/24/2021   Decreased estrogen level 11/24/2021   Syncope 11/24/2021   Sphenoid sinusitis 11/24/2021   Rotator cuff tear 11/22/2021   Full thickness rotator cuff tear 09/30/2021   Insomnia 09/23/2020   Dyslipidemia 09/23/2020   RLS (restless legs syndrome) 07/20/2020   Drug side effects, initial encounter 07/20/2020   Coronary atherosclerosis 03/18/2019   Post-traumatic headache, not intractable 12/04/2018   Grief 09/12/2018   Atrophic vaginitis  09/06/2017   Burning sensation of mouth 09/06/2017   Osteopenia 09/05/2016   Upper respiratory infection 09/05/2016   Chest pain, atypical 03/04/2016   Headache 07/19/2013   Tinnitus 07/19/2013   Dry skin dermatitis 08/31/2012   Left hip pain 08/31/2012   Cerumen impaction 08/31/2012   Well adult exam 05/25/2012   Upper abdominal pain-chronic 04/02/2011   Thumb pain 03/11/2011   Shoulder pain 03/11/2011   Bilateral primary osteoarthritis of knee 09/10/2010   VERTIGO 03/05/2010   OSA on CPAP 07/22/2008   B12 deficiency 08/16/2007   Vitamin D  deficiency 08/16/2007   Nonorganic sleep disorder 05/10/2007   SARCOIDOSIS, PULMONARY 05/09/2007   Hypothyroidism 05/09/2007   Allergic rhinitis 05/09/2007   GERD 05/09/2007   History of colonic polyps 05/09/2007    REFERRING PROVIDER: Elspeth Her MD  REFERRING DIAG: S/p right RTC repair.  THERAPY DIAG:  Chronic right shoulder pain  Stiffness of right shoulder, not elsewhere classified  Localized edema  Rationale for Evaluation and Treatment: Rehabilitation  ONSET DATE: 08/28/23 (surgery date).  SUBJECTIVE:                                                                                                                                                                                      SUBJECTIVE STATEMENT: The patient arrives  today doing fairly well with RT shldr. 2-3/10 PERTINENT HISTORY: See above.  PAIN:  Are you having pain? Yes: NPRS scale: 3/10. Pain location: Right shoulder. Pain description: Sharp. Aggravating factors: Movement. Relieving factors: Medication.  PRECAUTIONS: Other: Progress per protocol.      WEIGHT BEARING RESTRICTIONS:  No right UE weight bearing.    FALLS:  Has patient fallen in last 6 months? No  LIVING ENVIRONMENT: Lives in: House/apartment Has following equipment at home: None  OCCUPATION: Semi-Retired.  PLOF: Independent  PATIENT GOALS:Use right UE without pain.  NEXT MD VISIT:   OBJECTIVE:  Note: Objective measures were completed at Evaluation unless otherwise noted.  UPPER EXTREMITY ROM:   In supine:  Gentle right shoulder PROM to 75 degrees of flexion and ER to 10 degrees.  PALPATION:  Right shoulder incision cover with sterile gauze (patient's performed for her).  She c/o diffuse shoulder pain currently and her shoulder is visibly swollen.  TREATMENT DATE: 09/04/23 Manual:  PROM/ PAROM for elevation as well as ER with Pt in hook lying x 28 mins f/b review of HEP. Vasopneumatic on low x 10 minutes with pillow between right elbow ans thorax.     PATIENT EDUCATION: Education details: See below (this  exercise was part of her hospital discharge packet). Person educated: Patient Education method: Explanation, Demonstration, Tactile cues, Verbal cues, and Handouts Education comprehension: verbalized understanding and returned demonstration  HOME EXERCISE PROGRAM: HOME EXERCISE PROGRAM Created by Chad Applegate Jan 30th, 2025 View at [email or web address] using code: [email or web address]  Page 1 of 1 1 Exercise WAND EXTERNAL ROTATION - SUPINE ER Lie on your back holding a cane or wand with both hands.  On the affected side, place a small rolled up towel or pillow under your elbow. Maintain approx. 90 degree bend at the elbow with your arm approximately 30-45 degrees away from your side. GENTLE. PAIN-FREE Use your other arm to pull the wand/cane to rotate the affected arm back into a stretch. Hold and then return to starting position and then repeat. Repeat 10 Times Hold 15 Seconds Complete 1 Set Perform 4 Times a Day  ASSESSMENT:  CLINICAL IMPRESSION: Manual PROM/ PAROM was performed to RT shldr for elevation, ABD, and ER and was tolerated fairly well. Vaso end of session RT shldr. PROM to available ROM flexion 125 degrees, ER 35 degrees, and ABD to 90 degrees     OBJECTIVE IMPAIRMENTS: decreased activity tolerance, decreased ROM, increased edema, and pain.   ACTIVITY LIMITATIONS: carrying, lifting, and reach over head  PARTICIPATION LIMITATIONS: meal prep, cleaning, and laundry  REHAB POTENTIAL: Excellent  CLINICAL DECISION MAKING: Stable/uncomplicated  EVALUATION COMPLEXITY: Low   GOALS:   Target date:  09/14/23.             PT LONG TERM GOAL #1    Title independent with a HEP.     Time 4     Period Weeks     Status New          PT LONG TERM GOAL #2    Title Active right  shoulder flexion to 145 degrees so the patient can easily reach overhead.     Time 4     Period Weeks     Status New          PT LONG TERM GOAL #3    Title Active ER to 70 degrees+ to allow for  easily donning/doffing of apparel.     Time 4     Period Weeks     Status New          PT LONG TERM GOAL #4    Title Increase ROM so patient is able to reach behind back to L3.     Time 4     Period Weeks     Status New          PT LONG TERM GOAL #5    Title Increase right shoulder strength to a solid 4+/5 to increase stability for performance of functional activities.     Time 4     Period Weeks     Status New          PT LONG TERM GOAL #6    Title Perform ADL's with pain not > 2-3/10.     Time 6     Period Weeks     Status New       PLAN:  PT FREQUENCY: 2x/week  PT DURATION: 6  months  PLANNED INTERVENTIONS: 97110-Therapeutic exercises, 97530- Therapeutic activity, [id number]- Neuromuscular re-education, 97535- Self Care, [id number]- Manual therapy, 97014- Electrical stimulation (unattended), 97016- Vasopneumatic device, Cryotherapy, and Moist heat  PLAN FOR NEXT SESSION: Begin with P, P/AROM, vasopneumatic on low with pillow between thorax and elbow.    Liala Codispoti,CHRIS, PTA 09/08/2023, 9:01 AM

## 2023-09-11 ENCOUNTER — Ambulatory Visit: Payer: Medicare Other | Admitting: *Deleted

## 2023-09-11 ENCOUNTER — Encounter: Payer: Self-pay | Admitting: *Deleted

## 2023-09-11 DIAGNOSIS — R600 Localized edema: Secondary | ICD-10-CM | POA: Diagnosis not present

## 2023-09-11 DIAGNOSIS — G8929 Other chronic pain: Secondary | ICD-10-CM | POA: Diagnosis not present

## 2023-09-11 DIAGNOSIS — M25611 Stiffness of right shoulder, not elsewhere classified: Secondary | ICD-10-CM

## 2023-09-11 DIAGNOSIS — M25511 Pain in right shoulder: Secondary | ICD-10-CM | POA: Diagnosis not present

## 2023-09-11 NOTE — Therapy (Signed)
 OUTPATIENT PHYSICAL THERAPY SHOULDER TREATMENT   Patient Name: Elizabeth Gray MRN: [id number] DOB:[birth year], 83 y.o., female Today's Date: 09/11/2023  END OF SESSION:  PT End of Session - 09/11/23 1019     Visit Number 4    Number of Visits 12    Date for PT Re-Evaluation 10/12/23    Authorization Type FOTO AT LEAST EVERY 5TH VISIT.  PROGRESS NOTE AT 10TH VISIT.  KX MODIFIER AFTER 15 VISITS.    PT Start Time 1015             Past Medical History:  Diagnosis Date   Allergy     Bursitis    Cancer (HCC) 03/22/2017   Melanoma in situ, lentigo maligna type   Deviated septum    External hemorrhoids without mention of complication 2008   Colonoscopy    Fatigue    GERD (gastroesophageal reflux disease)    Headache(784.0)    migraines   Heat rash    under the breasts.Aaron Aasappeared on monday.Aaron AasAaron AasBurning & itching, uses cortisone   Hypothyroidism    Iron deficiency anemia, unspecified    Lower esophageal ring 2008   EGD   Nonorganic sleep disorder, unspecified    Osteoarthritis    Osteoporosis    Pancreatitis    Personal history of colonic polyps    Polio 1948   Pulmonary sarcoidosis (HCC)    Sleep apnea    [facility]   Vitamin B12 deficiency    Vitamin D  deficiency    Past Surgical History:  Procedure Laterality Date   APPENDECTOMY  1988   CARPAL TUNNEL RELEASE  2002   rt   CHOLECYSTECTOMY  2000   COLONOSCOPY  12/19/2006   Multiple diminutive polyps destroyed-removed (hyperpastic), internal hemorrhoids   ESOPHAGOGASTRODUODENOSCOPY  12/19/2006   lower esophageal ring dilated   HAND SURGERY  1997   left   KNEE ARTHROSCOPY Bilateral    multiple 2 on lft 1 on rt   LUMBAR DISC SURGERY  1995   Melanoma Removal  Right 2018   right face   ROTATOR CUFF REPAIR Left    Nov 29, 2021   TOE SURGERY Left    left foot next to little toe  joint rem   TOTAL KNEE ARTHROPLASTY Right 05/09/2014   Procedure: RIGHT TOTAL KNEE ARTHROPLASTY;   Surgeon: Lorriane Rote, MD;  Location: [facility] OR;  Service: Orthopedics;  Laterality: Right;   TOTAL KNEE ARTHROPLASTY Left 08/06/2021   Procedure: TOTAL KNEE ARTHROPLASTY;  Surgeon: Winston Hawking, MD;  Location: [facility];  Service: Orthopedics;  Laterality: Left;   UPPER GASTROINTESTINAL ENDOSCOPY     WISDOM TOOTH EXTRACTION     Patient Active Problem List   Diagnosis Date Noted   Preop exam for internal medicine 06/19/2023   Aortic atherosclerosis (HCC) 10/19/2022   Elevated coronary artery calcium score 10/19/2022   Edema 08/29/2022   Palpitations 01/24/2022   Abdominal hernia 11/24/2021   Anemia 11/24/2021   Asthma 11/24/2021   Decreased estrogen level 11/24/2021   Syncope 11/24/2021   Sphenoid sinusitis 11/24/2021   Rotator cuff tear 11/22/2021   Full thickness rotator cuff tear 09/30/2021   Insomnia 09/23/2020   Dyslipidemia 09/23/2020   RLS (restless legs syndrome) 07/20/2020   Drug side effects, initial encounter 07/20/2020   Coronary atherosclerosis 03/18/2019   Post-traumatic headache, not intractable 12/04/2018   Grief 09/12/2018   Atrophic vaginitis 09/06/2017   Burning sensation of mouth 09/06/2017   Osteopenia 09/05/2016   Upper respiratory  infection 09/05/2016   Chest pain, atypical 03/04/2016   Headache 07/19/2013   Tinnitus 07/19/2013   Dry skin dermatitis 08/31/2012   Left hip pain 08/31/2012   Cerumen impaction 08/31/2012   Well adult exam 05/25/2012   Upper abdominal pain-chronic 04/02/2011   Thumb pain 03/11/2011   Shoulder pain 03/11/2011   Bilateral primary osteoarthritis of knee 09/10/2010   VERTIGO 03/05/2010   OSA on CPAP 07/22/2008   B12 deficiency 08/16/2007   Vitamin D  deficiency 08/16/2007   Nonorganic sleep disorder 05/10/2007   SARCOIDOSIS, PULMONARY 05/09/2007   Hypothyroidism 05/09/2007   Allergic rhinitis 05/09/2007   GERD 05/09/2007   History of colonic polyps 05/09/2007   REFERRING PROVIDER: Marionette Sick MD  REFERRING DIAG: S/p  right RTC repair.  THERAPY DIAG:  Chronic right shoulder pain  Stiffness of right shoulder, not elsewhere classified  Localized edema  Rationale for Evaluation and Treatment: Rehabilitation  ONSET DATE: 08/28/23 (surgery date).  SUBJECTIVE:                                                                                                                                                                                      SUBJECTIVE STATEMENT: The patient arrives  today doing fairly well with RT shldr. 2-3/10 still PERTINENT HISTORY: See above.  PAIN:  Are you having pain? Yes: NPRS scale: 3/10. Pain location: Right shoulder. Pain description: Sharp. Aggravating factors: Movement. Relieving factors: Medication.  PRECAUTIONS: Other: Progress per protocol.      WEIGHT BEARING RESTRICTIONS:  No right UE weight bearing.    FALLS:  Has patient fallen in last 6 months? No  LIVING ENVIRONMENT: Lives in: House/apartment Has following equipment at home: None  OCCUPATION: "Semi-Retired."  PLOF: Independent  PATIENT GOALS:Use right UE without pain.  NEXT MD VISIT:   OBJECTIVE:  Note: Objective measures were completed at Evaluation unless otherwise noted.  UPPER EXTREMITY ROM:   In supine:  Gentle right shoulder PROM to 75 degrees of flexion and ER to 10 degrees.  PALPATION:  Right shoulder incision cover with sterile gauze (patient's performed for her).  She c/o diffuse shoulder pain currently and her shoulder is visibly swollen.  TREATMENT DATE: 2/10 /25 Manual:  PROM/ PAROM for elevation as well as ER with Pt in hook lying x 28 mins f/b review of HEP. Vasopneumatic on low x 15 minutes with pillow between right elbow ans thorax.     PATIENT EDUCATION: Education details: See below (this exercise was part of her hospital discharge  packet). Person educated: Patient Education method: Explanation, Demonstration, Tactile cues, Verbal cues, and Handouts Education comprehension: verbalized understanding and returned demonstration  HOME EXERCISE PROGRAM: HOME EXERCISE PROGRAM Created by [place] Applegate Jan 30th, 2025 View at [email or web address] using code: [email or web address]  Page 1 of 1 1 Exercise WAND EXTERNAL ROTATION - SUPINE ER Lie on your back holding a cane or wand with both hands.  On the affected side, place a small rolled up towel or pillow under your elbow. Maintain approx. 90 degree bend at the elbow with your arm approximately 30-45 degrees away from your side. GENTLE. PAIN-FREE Use your other arm to pull the wand/cane to rotate the affected arm back into a stretch. Hold and then return to starting position and then repeat. Repeat 10 Times Hold 15 Seconds Complete 1 Set Perform 4 Times a Day  ASSESSMENT:  CLINICAL IMPRESSION: Pt arrived with ABD pillow dawned and pain 2-3/10. Manual PROM/ PAROM was performed to RT shldr for elevation, ABD, and ER and was tolerated well with soft end-feel. Vaso end of session RT shldr.      OBJECTIVE IMPAIRMENTS: decreased activity tolerance, decreased ROM, increased edema, and pain.   ACTIVITY LIMITATIONS: carrying, lifting, and reach over head  PARTICIPATION LIMITATIONS: meal prep, cleaning, and laundry  REHAB POTENTIAL: Excellent  CLINICAL DECISION MAKING: Stable/uncomplicated  EVALUATION COMPLEXITY: Low   GOALS:   Target date:  09/14/23.             PT LONG TERM GOAL #1    Title independent with a HEP.     Time 4     Period Weeks     Status New          PT LONG TERM GOAL #2    Title Active right  shoulder flexion to 145 degrees so the patient can easily reach overhead.     Time 4     Period Weeks     Status New          PT LONG TERM GOAL #3    Title Active ER to 70 degrees+ to allow for easily donning/doffing of apparel.     Time 4     Period  Weeks     Status New          PT LONG TERM GOAL #4    Title Increase ROM so patient is able to reach behind back to L3.     Time 4     Period Weeks     Status New          PT LONG TERM GOAL #5    Title Increase right shoulder strength to a solid 4+/5 to increase stability for performance of functional activities.     Time 4     Period Weeks     Status New          PT LONG TERM GOAL #6    Title Perform ADL's with pain not > 2-3/10.     Time 6     Period Weeks     Status New       PLAN:  PT FREQUENCY: 2x/week  PT DURATION: 6 months  PLANNED INTERVENTIONS: 97110-Therapeutic exercises, 97530- Therapeutic activity, [id number]- Neuromuscular re-education, 97535- Self Care, 25366- Manual therapy, 97014- Electrical stimulation (unattended), 97016- Vasopneumatic device, Cryotherapy, and Moist heat  PLAN FOR NEXT SESSION: Begin with P, P/AROM, vasopneumatic on low with pillow between thorax and elbow.    Lambros Cerro,CHRIS, PTA 09/11/2023, 3:46 PM

## 2023-09-15 ENCOUNTER — Ambulatory Visit: Payer: Medicare Other | Admitting: *Deleted

## 2023-09-15 ENCOUNTER — Encounter: Payer: Self-pay | Admitting: *Deleted

## 2023-09-15 DIAGNOSIS — M25611 Stiffness of right shoulder, not elsewhere classified: Secondary | ICD-10-CM

## 2023-09-15 DIAGNOSIS — G8929 Other chronic pain: Secondary | ICD-10-CM

## 2023-09-15 DIAGNOSIS — M25511 Pain in right shoulder: Secondary | ICD-10-CM | POA: Diagnosis not present

## 2023-09-15 DIAGNOSIS — R600 Localized edema: Secondary | ICD-10-CM | POA: Diagnosis not present

## 2023-09-15 NOTE — Therapy (Signed)
OUTPATIENT PHYSICAL THERAPY SHOULDER TREATMENT   Patient Name: Elizabeth Gray MRN: [id number] DOB:07-May-1941, 83 y.o., female Today's Date: 09/15/2023  END OF SESSION:  PT End of Session - 09/15/23 1057     Visit Number 5    Number of Visits 12    Date for PT Re-Evaluation 10/12/23    Authorization Type FOTO AT LEAST EVERY 5TH VISIT.  PROGRESS NOTE AT 10TH VISIT.  KX MODIFIER AFTER 15 VISITS.    PT Start Time 1015             Past Medical History:  Diagnosis Date   Allergy    Bursitis    Cancer (HCC) 03/22/2017   Melanoma in situ, lentigo maligna type   Deviated septum    External hemorrhoids without mention of complication 2008   Colonoscopy    Fatigue    GERD (gastroesophageal reflux disease)    Headache(784.0)    migraines   Heat rash    under the breasts.Marland Kitchenappeared on monday.Marland KitchenMarland KitchenBurning & itching, uses cortisone   Hypothyroidism    Iron deficiency anemia, unspecified    Lower esophageal ring 2008   EGD   Nonorganic sleep disorder, unspecified    Osteoarthritis    Osteoporosis    Pancreatitis    Personal history of colonic polyps    Polio 1948   Pulmonary sarcoidosis (HCC)    Sleep apnea    [facility]   Vitamin B12 deficiency    Vitamin D deficiency    Past Surgical History:  Procedure Laterality Date   APPENDECTOMY  1988   CARPAL TUNNEL RELEASE  2002   rt   CHOLECYSTECTOMY  2000   COLONOSCOPY  12/19/2006   Multiple diminutive polyps destroyed-removed (hyperpastic), internal hemorrhoids   ESOPHAGOGASTRODUODENOSCOPY  12/19/2006   lower esophageal ring dilated   HAND SURGERY  1997   left   KNEE ARTHROSCOPY Bilateral    multiple 2 on lft 1 on rt   LUMBAR DISC SURGERY  1995   Melanoma Removal  Right 2018   right face   ROTATOR CUFF REPAIR Left    Nov 29, 2021   TOE SURGERY Left    left foot next to little toe  joint rem   TOTAL KNEE ARTHROPLASTY Right 05/09/2014   Procedure: RIGHT TOTAL KNEE ARTHROPLASTY;   Surgeon: Verlee Rossetti, MD;  Location: [facility] OR;  Service: Orthopedics;  Laterality: Right;   TOTAL KNEE ARTHROPLASTY Left 08/06/2021   Procedure: TOTAL KNEE ARTHROPLASTY;  Surgeon: Beverely Low, MD;  Location: [facility];  Service: Orthopedics;  Laterality: Left;   UPPER GASTROINTESTINAL ENDOSCOPY     WISDOM TOOTH EXTRACTION     Patient Active Problem List   Diagnosis Date Noted   Preop exam for internal medicine 06/19/2023   Aortic atherosclerosis (HCC) 10/19/2022   Elevated coronary artery calcium score 10/19/2022   Edema 08/29/2022   Palpitations 01/24/2022   Abdominal hernia 11/24/2021   Anemia 11/24/2021   Asthma 11/24/2021   Decreased estrogen level 11/24/2021   Syncope 11/24/2021   Sphenoid sinusitis 11/24/2021   Rotator cuff tear 11/22/2021   Full thickness rotator cuff tear 09/30/2021   Insomnia 09/23/2020   Dyslipidemia 09/23/2020   RLS (restless legs syndrome) 07/20/2020   Drug side effects, initial encounter 07/20/2020   Coronary atherosclerosis 03/18/2019   Post-traumatic headache, not intractable 12/04/2018   Grief 09/12/2018   Atrophic vaginitis 09/06/2017   Burning sensation of mouth 09/06/2017   Osteopenia 09/05/2016   Upper respiratory  infection 09/05/2016   Chest pain, atypical 03/04/2016   Headache 07/19/2013   Tinnitus 07/19/2013   Dry skin dermatitis 08/31/2012   Left hip pain 08/31/2012   Cerumen impaction 08/31/2012   Well adult exam 05/25/2012   Upper abdominal pain-chronic 04/02/2011   Thumb pain 03/11/2011   Shoulder pain 03/11/2011   Bilateral primary osteoarthritis of knee 09/10/2010   VERTIGO 03/05/2010   OSA on CPAP 07/22/2008   B12 deficiency 08/16/2007   Vitamin D deficiency 08/16/2007   Nonorganic sleep disorder 05/10/2007   SARCOIDOSIS, PULMONARY 05/09/2007   Hypothyroidism 05/09/2007   Allergic rhinitis 05/09/2007   GERD 05/09/2007   History of colonic polyps 05/09/2007   REFERRING PROVIDER: Malon Kindle MD  REFERRING DIAG: S/p  right RTC repair.  THERAPY DIAG:  Chronic right shoulder pain  Stiffness of right shoulder, not elsewhere classified  Localized edema  Rationale for Evaluation and Treatment: Rehabilitation  ONSET DATE: 08/28/23 (surgery date).  SUBJECTIVE:                                                                                                                                                                                      SUBJECTIVE STATEMENT: The patient arrives  today doing fairly well with RT shldr. 2/10 still. Able to get deodorant on today PERTINENT HISTORY: See above.  PAIN:  Are you having pain? Yes: NPRS scale: 2/10. Pain location: Right shoulder. Pain description: Sharp. Aggravating factors: Movement. Relieving factors: Medication.  PRECAUTIONS: Other: Progress per protocol.      WEIGHT BEARING RESTRICTIONS:  No right UE weight bearing.    FALLS:  Has patient fallen in last 6 months? No  LIVING ENVIRONMENT: Lives in: House/apartment Has following equipment at home: None  OCCUPATION: "Semi-Retired."  PLOF: Independent  PATIENT GOALS:Use right UE without pain.  NEXT MD VISIT:   OBJECTIVE:  Note: Objective measures were completed at Evaluation unless otherwise noted.  UPPER EXTREMITY ROM:   In supine:  Gentle right shoulder PROM to 75 degrees of flexion and ER to 10 degrees.  PALPATION:  Right shoulder incision cover with sterile gauze (patient's performed for her).  She c/o diffuse shoulder pain currently and her shoulder is visibly swollen.  TREATMENT DATE: 2/14 /25 Manual:  PROM/ PAROM for elevation as well as ER with Pt in hook lying x 30 mins f/b review of HEP. Vasopneumatic on low x 15 minutes with pillow between right elbow ans thorax.     PATIENT EDUCATION: Education details: See below (this exercise was part of her  hospital discharge packet). Person educated: Patient Education method: Explanation, Demonstration, Tactile cues, Verbal cues, and Handouts Education comprehension: verbalized understanding and returned demonstration  HOME EXERCISE PROGRAM: HOME EXERCISE PROGRAM Created by [place] Applegate Jan 30th, 2025 View at [email or web address] using code: [email or web address]  Page 1 of 1 1 Exercise WAND EXTERNAL ROTATION - SUPINE ER Lie on your back holding a cane or wand with both hands.  On the affected side, place a small rolled up towel or pillow under your elbow. Maintain approx. 90 degree bend at the elbow with your arm approximately 30-45 degrees away from your side. GENTLE. PAIN-FREE Use your other arm to pull the wand/cane to rotate the affected arm back into a stretch. Hold and then return to starting position and then repeat. Repeat 10 Times Hold 15 Seconds Complete 1 Set Perform 4 Times a Day  ASSESSMENT:  CLINICAL IMPRESSION: Pt arrived with ABD pillow dawned and pain 2/10 today. Rx focused on  Manual PROM/ PAROM to RT shldr for elevation, ABD, and ER and was tolerated well with soft end-feel. Vaso end of session RT shldr. Flexion to 135 degrees and ER to 60 degrees.     OBJECTIVE IMPAIRMENTS: decreased activity tolerance, decreased ROM, increased edema, and pain.   ACTIVITY LIMITATIONS: carrying, lifting, and reach over head  PARTICIPATION LIMITATIONS: meal prep, cleaning, and laundry  REHAB POTENTIAL: Excellent  CLINICAL DECISION MAKING: Stable/uncomplicated  EVALUATION COMPLEXITY: Low   GOALS:   Target date:  09/14/23.             PT LONG TERM GOAL #1    Title independent with a HEP.     Time 4     Period Weeks     Status Partilly met         PT LONG TERM GOAL #2    Title Active right  shoulder flexion to 145 degrees so the patient can easily reach overhead.     Time 4     Period Weeks     Status On going         PT LONG TERM GOAL #3    Title Active ER to 70  degrees+ to allow for easily donning/doffing of apparel.     Time 4     Period Weeks     Status On going         PT LONG TERM GOAL #4    Title Increase ROM so patient is able to reach behind back to L3.     Time 4     Period Weeks     Status On going         PT LONG TERM GOAL #5    Title Increase right shoulder strength to a solid 4+/5 to increase stability for performance of functional activities.     Time 4     Period Weeks     Status On going         PT LONG TERM GOAL #6    Title Perform ADL's with pain not > 2-3/10.     Time 6     Period Weeks     Status On going  PLAN:  PT FREQUENCY: 2x/week  PT DURATION: 6 months  PLANNED INTERVENTIONS: 97110-Therapeutic exercises, 97530- Therapeutic activity, [id number]- Neuromuscular re-education, 97535- Self Care, 16109- Manual therapy, 97014- Electrical stimulation (unattended), 97016- Vasopneumatic device, Cryotherapy, and Moist heat  PLAN FOR NEXT SESSION: Begin with P, P/AROM, vasopneumatic on low with pillow between thorax and elbow.    Birdie Beveridge,CHRIS, PTA 09/15/2023, 10:58 AM

## 2023-09-19 ENCOUNTER — Ambulatory Visit: Payer: Medicare Other | Admitting: *Deleted

## 2023-09-19 ENCOUNTER — Encounter: Payer: Self-pay | Admitting: *Deleted

## 2023-09-19 DIAGNOSIS — R600 Localized edema: Secondary | ICD-10-CM | POA: Diagnosis not present

## 2023-09-19 DIAGNOSIS — G8929 Other chronic pain: Secondary | ICD-10-CM | POA: Diagnosis not present

## 2023-09-19 DIAGNOSIS — M25611 Stiffness of right shoulder, not elsewhere classified: Secondary | ICD-10-CM

## 2023-09-19 DIAGNOSIS — M25511 Pain in right shoulder: Secondary | ICD-10-CM | POA: Diagnosis not present

## 2023-09-19 NOTE — Therapy (Signed)
OUTPATIENT PHYSICAL THERAPY SHOULDER TREATMENT   Patient Name: Elizabeth Gray MRN: [id number] DOB:1941-03-25, 83 y.o., female Today's Date: 09/19/2023  END OF SESSION:  PT End of Session - 09/19/23 1507     Visit Number 6    Number of Visits 12    Date for PT Re-Evaluation 10/12/23    Authorization Type FOTO AT LEAST EVERY 5TH VISIT.  PROGRESS NOTE AT 10TH VISIT.  KX MODIFIER AFTER 15 VISITS.    PT Start Time 1430    PT Stop Time 1520    PT Time Calculation (min) 50 min             Past Medical History:  Diagnosis Date   Allergy    Bursitis    Cancer (HCC) 03/22/2017   Melanoma in situ, lentigo maligna type   Deviated septum    External hemorrhoids without mention of complication 2008   Colonoscopy    Fatigue    GERD (gastroesophageal reflux disease)    Headache(784.0)    migraines   Heat rash    under the breasts.Marland Kitchenappeared on monday.Marland KitchenMarland KitchenBurning & itching, uses cortisone   Hypothyroidism    Iron deficiency anemia, unspecified    Lower esophageal ring 2008   EGD   Nonorganic sleep disorder, unspecified    Osteoarthritis    Osteoporosis    Pancreatitis    Personal history of colonic polyps    Polio 1948   Pulmonary sarcoidosis (HCC)    Sleep apnea    [facility]ap [facility]   Vitamin B12 deficiency    Vitamin D deficiency    Past Surgical History:  Procedure Laterality Date   APPENDECTOMY  1988   CARPAL TUNNEL RELEASE  2002   rt   CHOLECYSTECTOMY  2000   COLONOSCOPY  12/19/2006   Multiple diminutive polyps destroyed-removed (hyperpastic), internal hemorrhoids   ESOPHAGOGASTRODUODENOSCOPY  12/19/2006   lower esophageal ring dilated   HAND SURGERY  1997   left   KNEE ARTHROSCOPY Bilateral    multiple 2 on lft 1 on rt   LUMBAR DISC SURGERY  1995   Melanoma Removal  Right 2018   right face   ROTATOR CUFF REPAIR Left    Nov 29, 2021   TOE SURGERY Left    left foot next to little toe  joint rem   TOTAL KNEE ARTHROPLASTY Right  05/09/2014   Procedure: RIGHT TOTAL KNEE ARTHROPLASTY;  Surgeon: Verlee Rossetti, MD;  Location: [facility] OR;  Service: Orthopedics;  Laterality: Right;   TOTAL KNEE ARTHROPLASTY Left 08/06/2021   Procedure: TOTAL KNEE ARTHROPLASTY;  Surgeon: Beverely Low, MD;  Location: [facility] OR[facility];  Service: Orthopedics;  Laterality: Left;   UPPER GASTROINTESTINAL ENDOSCOPY     WISDOM TOOTH EXTRACTION     Patient Active Problem List   Diagnosis Date Noted   Preop exam for internal medicine 06/19/2023   Aortic atherosclerosis (HCC) 10/19/2022   Elevated coronary artery calcium score 10/19/2022   Edema 08/29/2022   Palpitations 01/24/2022   Abdominal hernia 11/24/2021   Anemia 11/24/2021   Asthma 11/24/2021   Decreased estrogen level 11/24/2021   Syncope 11/24/2021   Sphenoid sinusitis 11/24/2021   Rotator cuff tear 11/22/2021   Full thickness rotator cuff tear 09/30/2021   Insomnia 09/23/2020   Dyslipidemia 09/23/2020   RLS (restless legs syndrome) 07/20/2020   Drug side effects, initial encounter 07/20/2020   Coronary atherosclerosis 03/18/2019   Post-traumatic headache, not intractable 12/04/2018   Grief 09/12/2018   Atrophic vaginitis  09/06/2017   Burning sensation of mouth 09/06/2017   Osteopenia 09/05/2016   Upper respiratory infection 09/05/2016   Chest pain, atypical 03/04/2016   Headache 07/19/2013   Tinnitus 07/19/2013   Dry skin dermatitis 08/31/2012   Left hip pain 08/31/2012   Cerumen impaction 08/31/2012   Well adult exam 05/25/2012   Upper abdominal pain-chronic 04/02/2011   Thumb pain 03/11/2011   Shoulder pain 03/11/2011   Bilateral primary osteoarthritis of knee 09/10/2010   VERTIGO 03/05/2010   OSA on CPAP 07/22/2008   B12 deficiency 08/16/2007   Vitamin D deficiency 08/16/2007   Nonorganic sleep disorder 05/10/2007   SARCOIDOSIS, PULMONARY 05/09/2007   Hypothyroidism 05/09/2007   Allergic rhinitis 05/09/2007   GERD 05/09/2007   History of colonic polyps 05/09/2007    REFERRING PROVIDER: Malon Kindle MD  REFERRING DIAG: S/p right RTC repair.  THERAPY DIAG:  Chronic right shoulder pain  Stiffness of right shoulder, not elsewhere classified  Localized edema  Rationale for Evaluation and Treatment: Rehabilitation  ONSET DATE: 08/28/23 (surgery date).  SUBJECTIVE:                                                                                                                                                                                      SUBJECTIVE STATEMENT: The patient arrives  today doing fairly well with RT shldr. 3/10. Folded some towels yesterday and have some soreness PERTINENT HISTORY: See above.  PAIN:  Are you having pain? Yes: NPRS scale: 2/10. Pain location: Right shoulder. Pain description: Sharp. Aggravating factors: Movement. Relieving factors: Medication.  PRECAUTIONS: Other: Progress per protocol.      WEIGHT BEARING RESTRICTIONS:  No right UE weight bearing.    FALLS:  Has patient fallen in last 6 months? No  LIVING ENVIRONMENT: Lives in: House/apartment Has following equipment at home: None  OCCUPATION: "Semi-Retired."  PLOF: Independent  PATIENT GOALS:Use right UE without pain.  NEXT MD VISIT:   OBJECTIVE:  Note: Objective measures were completed at Evaluation unless otherwise noted.  UPPER EXTREMITY ROM:   In supine:  Gentle right shoulder PROM to 75 degrees of flexion and ER to 10 degrees.  PALPATION:  Right shoulder incision cover with sterile gauze (patient's performed for her).  She c/o diffuse shoulder pain currently and her shoulder is visibly swollen.  TREATMENT DATE: 2/18 /25  Manual:   PROM/ PAROM for flexion, ABD, as well as ER with Pt in hook lying  Vasopneumatic on low x 15 minutes with pillow between right elbow ans thorax.     PATIENT  EDUCATION: Education details: See below (this exercise was part of her hospital discharge packet). Person educated: Patient Education method: Explanation, Demonstration, Tactile cues, Verbal cues, and Handouts Education comprehension: verbalized understanding and returned demonstration  HOME EXERCISE PROGRAM: HOME EXERCISE PROGRAM Created by [place] Applegate Jan 30th, 2025 View at [email or web address] using code: [email or web address]  Page 1 of 1 1 Exercise WAND EXTERNAL ROTATION - SUPINE ER Lie on your back holding a cane or wand with both hands.  On the affected side, place a small rolled up towel or pillow under your elbow. Maintain approx. 90 degree bend at the elbow with your arm approximately 30-45 degrees away from your side. GENTLE. PAIN-FREE Use your other arm to pull the wand/cane to rotate the affected arm back into a stretch. Hold and then return to starting position and then repeat. Repeat 10 Times Hold 15 Seconds Complete 1 Set Perform 4 Times a Day  ASSESSMENT:  CLINICAL IMPRESSION: Pt arrived today with some incrreased soreness RT shldr due to using it some to fold towels.3/10. Rx focused on  Manual PROM/ PAROM to RT shldr for elevation, ABD, and ER and was tolerated well with soft end-feel still. Vaso end of session RT shldr. Flexion to 135 degrees and ER to 62 degrees.     OBJECTIVE IMPAIRMENTS: decreased activity tolerance, decreased ROM, increased edema, and pain.   ACTIVITY LIMITATIONS: carrying, lifting, and reach over head  PARTICIPATION LIMITATIONS: meal prep, cleaning, and laundry  REHAB POTENTIAL: Excellent  CLINICAL DECISION MAKING: Stable/uncomplicated  EVALUATION COMPLEXITY: Low   GOALS:   Target date:  09/14/23.             PT LONG TERM GOAL #1    Title independent with a HEP.     Time 4     Period Weeks     Status Partilly met         PT LONG TERM GOAL #2    Title Active right  shoulder flexion to 145 degrees so the patient can easily reach  overhead.     Time 4     Period Weeks     Status On going         PT LONG TERM GOAL #3    Title Active ER to 70 degrees+ to allow for easily donning/doffing of apparel.     Time 4     Period Weeks     Status On going         PT LONG TERM GOAL #4    Title Increase ROM so patient is able to reach behind back to L3.     Time 4     Period Weeks     Status On going         PT LONG TERM GOAL #5    Title Increase right shoulder strength to a solid 4+/5 to increase stability for performance of functional activities.     Time 4     Period Weeks     Status On going         PT LONG TERM GOAL #6    Title Perform ADL's with pain not > 2-3/10.     Time 6     Period Weeks     Status On going  PLAN:  PT FREQUENCY: 2x/week  PT DURATION: 6 months  PLANNED INTERVENTIONS: 97110-Therapeutic exercises, 97530- Therapeutic activity, [id number]- Neuromuscular re-education, 97535- Self Care, 16109- Manual therapy, 97014- Electrical stimulation (unattended), 97016- Vasopneumatic device, Cryotherapy, and Moist heat  PLAN FOR NEXT SESSION: Begin with P, P/AROM, vasopneumatic on low with pillow between thorax and elbow.    Arif Amendola,CHRIS, PTA 09/19/2023, 5:41 PM

## 2023-09-22 ENCOUNTER — Encounter: Payer: Self-pay | Admitting: *Deleted

## 2023-09-22 ENCOUNTER — Ambulatory Visit: Payer: Medicare Other | Admitting: *Deleted

## 2023-09-22 DIAGNOSIS — R600 Localized edema: Secondary | ICD-10-CM | POA: Diagnosis not present

## 2023-09-22 DIAGNOSIS — M25611 Stiffness of right shoulder, not elsewhere classified: Secondary | ICD-10-CM

## 2023-09-22 DIAGNOSIS — G8929 Other chronic pain: Secondary | ICD-10-CM | POA: Diagnosis not present

## 2023-09-22 DIAGNOSIS — M25511 Pain in right shoulder: Secondary | ICD-10-CM | POA: Diagnosis not present

## 2023-09-22 NOTE — Therapy (Signed)
OUTPATIENT PHYSICAL THERAPY SHOULDER TREATMENT   Patient Name: Elizabeth Gray MRN: [id number] DOB:06/23/1941, 83 y.o., female Today's Date: 09/22/2023  END OF SESSION:  PT End of Session - 09/22/23 1106     Visit Number 7    Number of Visits 12    Date for PT Re-Evaluation 10/12/23    Authorization Type FOTO AT LEAST EVERY 5TH VISIT.  PROGRESS NOTE AT 10TH VISIT.  KX MODIFIER AFTER 15 VISITS.    PT Start Time 1015    PT Stop Time 1103    PT Time Calculation (min) 48 min              Past Medical History:  Diagnosis Date   Allergy    Bursitis    Cancer (HCC) 03/22/2017   Melanoma in situ, lentigo maligna type   Deviated septum    External hemorrhoids without mention of complication 2008   Colonoscopy    Fatigue    GERD (gastroesophageal reflux disease)    Headache(784.0)    migraines   Heat rash    under the breasts.Marland Kitchenappeared on monday.Marland KitchenMarland KitchenBurning & itching, uses cortisone   Hypothyroidism    Iron deficiency anemia, unspecified    Lower esophageal ring 2008   EGD   Nonorganic sleep disorder, unspecified    Osteoarthritis    Osteoporosis    Pancreatitis    Personal history of colonic polyps    Polio 1948   Pulmonary sarcoidosis (HCC)    Sleep apnea    [facility]   Vitamin B12 deficiency    Vitamin D deficiency    Past Surgical History:  Procedure Laterality Date   APPENDECTOMY  1988   CARPAL TUNNEL RELEASE  2002   rt   CHOLECYSTECTOMY  2000   COLONOSCOPY  12/19/2006   Multiple diminutive polyps destroyed-removed (hyperpastic), internal hemorrhoids   ESOPHAGOGASTRODUODENOSCOPY  12/19/2006   lower esophageal ring dilated   HAND SURGERY  1997   left   KNEE ARTHROSCOPY Bilateral    multiple 2 on lft 1 on rt   LUMBAR DISC SURGERY  1995   Melanoma Removal  Right 2018   right face   ROTATOR CUFF REPAIR Left    Nov 29, 2021   TOE SURGERY Left    left foot next to little toe  joint rem   TOTAL KNEE ARTHROPLASTY Right  05/09/2014   Procedure: RIGHT TOTAL KNEE ARTHROPLASTY;  Surgeon: Verlee Rossetti, MD;  Location: [facility] OR;  Service: Orthopedics;  Laterality: Right;   TOTAL KNEE ARTHROPLASTY Left 08/06/2021   Procedure: TOTAL KNEE ARTHROPLASTY;  Surgeon: Beverely Low, MD;  Location: [facility];  Service: Orthopedics;  Laterality: Left;   UPPER GASTROINTESTINAL ENDOSCOPY     WISDOM TOOTH EXTRACTION     Patient Active Problem List   Diagnosis Date Noted   Preop exam for internal medicine 06/19/2023   Aortic atherosclerosis (HCC) 10/19/2022   Elevated coronary artery calcium score 10/19/2022   Edema 08/29/2022   Palpitations 01/24/2022   Abdominal hernia 11/24/2021   Anemia 11/24/2021   Asthma 11/24/2021   Decreased estrogen level 11/24/2021   Syncope 11/24/2021   Sphenoid sinusitis 11/24/2021   Rotator cuff tear 11/22/2021   Full thickness rotator cuff tear 09/30/2021   Insomnia 09/23/2020   Dyslipidemia 09/23/2020   RLS (restless legs syndrome) 07/20/2020   Drug side effects, initial encounter 07/20/2020   Coronary atherosclerosis 03/18/2019   Post-traumatic headache, not intractable 12/04/2018   Grief 09/12/2018   Atrophic  vaginitis 09/06/2017   Burning sensation of mouth 09/06/2017   Osteopenia 09/05/2016   Upper respiratory infection 09/05/2016   Chest pain, atypical 03/04/2016   Headache 07/19/2013   Tinnitus 07/19/2013   Dry skin dermatitis 08/31/2012   Left hip pain 08/31/2012   Cerumen impaction 08/31/2012   Well adult exam 05/25/2012   Upper abdominal pain-chronic 04/02/2011   Thumb pain 03/11/2011   Shoulder pain 03/11/2011   Bilateral primary osteoarthritis of knee 09/10/2010   VERTIGO 03/05/2010   OSA on CPAP 07/22/2008   B12 deficiency 08/16/2007   Vitamin D deficiency 08/16/2007   Nonorganic sleep disorder 05/10/2007   SARCOIDOSIS, PULMONARY 05/09/2007   Hypothyroidism 05/09/2007   Allergic rhinitis 05/09/2007   GERD 05/09/2007   History of colonic polyps 05/09/2007    REFERRING PROVIDER: Malon Kindle MD  REFERRING DIAG: S/p right RTC repair.  THERAPY DIAG:  Chronic right shoulder pain  Stiffness of right shoulder, not elsewhere classified  Localized edema  Rationale for Evaluation and Treatment: Rehabilitation  ONSET DATE: 08/28/23 (surgery date).  SUBJECTIVE:                                                                                                                                                                                      SUBJECTIVE STATEMENT: The patient reports RT shldr.2- 3/10. Able to wright and open mail with my right hand now PERTINENT HISTORY: See above.  PAIN:  Are you having pain? Yes: NPRS scale: 2/10. Pain location: Right shoulder. Pain description: Sharp. Aggravating factors: Movement. Relieving factors: Medication.  PRECAUTIONS: Other: Progress per protocol.      WEIGHT BEARING RESTRICTIONS:  No right UE weight bearing.    FALLS:  Has patient fallen in last 6 months? No  LIVING ENVIRONMENT: Lives in: House/apartment Has following equipment at home: None  OCCUPATION: "Semi-Retired."  PLOF: Independent  PATIENT GOALS:Use right UE without pain.  NEXT MD VISIT:   OBJECTIVE:  Note: Objective measures were completed at Evaluation unless otherwise noted.  UPPER EXTREMITY ROM:   In supine:  Gentle right shoulder PROM to 75 degrees of flexion and ER to 10 degrees.  PALPATION:  Right shoulder incision cover with sterile gauze (patient's performed for her).  She c/o diffuse shoulder pain currently and her shoulder is visibly swollen.  TREATMENT DATE: 2/21 /25  Manual:   PROM/ PAROM for flexion, ABD, as well as ER with Pt in hook lying on mat PROM flexion to 162 degrees, 62 degrees, and ABD to 112 degrees Vasopneumatic on low x 15 minutes with pillow between right elbow  ans thorax.     PATIENT EDUCATION: Education details: See below (this exercise was part of her hospital discharge packet). Person educated: Patient Education method: Explanation, Demonstration, Tactile cues, Verbal cues, and Handouts Education comprehension: verbalized understanding and returned demonstration  HOME EXERCISE PROGRAM: HOME EXERCISE PROGRAM Created by [place] Applegate Jan 30th, 2025 View at [email or web address] using code: [email or web address]  Page 1 of 1 1 Exercise WAND EXTERNAL ROTATION - SUPINE ER Lie on your back holding a cane or wand with both hands.  On the affected side, place a small rolled up towel or pillow under your elbow. Maintain approx. 90 degree bend at the elbow with your arm approximately 30-45 degrees away from your side. GENTLE. PAIN-FREE Use your other arm to pull the wand/cane to rotate the affected arm back into a stretch. Hold and then return to starting position and then repeat. Repeat 10 Times Hold 15 Seconds Complete 1 Set Perform 4 Times a Day  ASSESSMENT:  CLINICAL IMPRESSION: Pt arrived today with some incrreased soreness RT shldr due to using it some to fold towels.2-3/10. Rx focused on  Manual PROM/ PAROM to RT shldr for elevation, ABD, and ER again. She had improvement in all motions today and  still with good end-feel..  Flexion to 162 degrees, ABD to 112 degrees  and ER to 62 degrees.Vaso end of session RT shldr.     OBJECTIVE IMPAIRMENTS: decreased activity tolerance, decreased ROM, increased edema, and pain.   ACTIVITY LIMITATIONS: carrying, lifting, and reach over head  PARTICIPATION LIMITATIONS: meal prep, cleaning, and laundry  REHAB POTENTIAL: Excellent  CLINICAL DECISION MAKING: Stable/uncomplicated  EVALUATION COMPLEXITY: Low   GOALS:   Target date:  09/14/23.             PT LONG TERM GOAL #1    Title independent with a HEP.     Time 4     Period Weeks     Status Partilly met         PT LONG TERM GOAL #2     Title Active right  shoulder flexion to 145 degrees so the patient can easily reach overhead.     Time 4     Period Weeks     Status On going         PT LONG TERM GOAL #3    Title Active ER to 70 degrees+ to allow for easily donning/doffing of apparel.     Time 4     Period Weeks     Status On going         PT LONG TERM GOAL #4    Title Increase ROM so patient is able to reach behind back to L3.     Time 4     Period Weeks     Status On going         PT LONG TERM GOAL #5    Title Increase right shoulder strength to a solid 4+/5 to increase stability for performance of functional activities.     Time 4     Period Weeks     Status On going         PT LONG TERM GOAL #6    Title Perform ADL's  with pain not > 2-3/10.     Time 6     Period Weeks     Status On going      PLAN:  PT FREQUENCY: 2x/week  PT DURATION: 6 months  PLANNED INTERVENTIONS: 97110-Therapeutic exercises, 97530- Therapeutic activity, [id number]- Neuromuscular re-education, 97535- Self Care, 62130- Manual therapy, 97014- Electrical stimulation (unattended), 97016- Vasopneumatic device, Cryotherapy, and Moist heat  PLAN FOR NEXT SESSION: Begin with P, P/AROM, vasopneumatic on low with pillow between thorax and elbow.    Neddie Steedman,CHRIS, PTA 09/22/2023, 11:07 AM

## 2023-09-25 ENCOUNTER — Encounter: Payer: Self-pay | Admitting: *Deleted

## 2023-09-25 ENCOUNTER — Ambulatory Visit: Payer: Medicare Other | Admitting: *Deleted

## 2023-09-25 DIAGNOSIS — R600 Localized edema: Secondary | ICD-10-CM | POA: Diagnosis not present

## 2023-09-25 DIAGNOSIS — M25611 Stiffness of right shoulder, not elsewhere classified: Secondary | ICD-10-CM

## 2023-09-25 DIAGNOSIS — M25511 Pain in right shoulder: Secondary | ICD-10-CM | POA: Diagnosis not present

## 2023-09-25 DIAGNOSIS — G8929 Other chronic pain: Secondary | ICD-10-CM | POA: Diagnosis not present

## 2023-09-25 NOTE — Therapy (Signed)
 OUTPATIENT PHYSICAL THERAPY SHOULDER TREATMENT   Patient Name: JEANISE DURFEY MRN: [id number] DOB:1941-06-23, 83 y.o., female Today's Date: 09/25/2023  END OF SESSION:  PT End of Session - 09/25/23 1055     Visit Number 8    Number of Visits 12    Date for PT Re-Evaluation 10/12/23    Authorization Type FOTO AT LEAST EVERY 5TH VISIT.  PROGRESS NOTE AT 10TH VISIT.  KX MODIFIER AFTER 15 VISITS.    PT Start Time 1015    PT Stop Time 1102    PT Time Calculation (min) 47 min              Past Medical History:  Diagnosis Date   Allergy    Bursitis    Cancer (HCC) 03/22/2017   Melanoma in situ, lentigo maligna type   Deviated septum    External hemorrhoids without mention of complication 2008   Colonoscopy    Fatigue    GERD (gastroesophageal reflux disease)    Headache(784.0)    migraines   Heat rash    under the breasts.Marland Kitchenappeared on monday.Marland KitchenMarland KitchenBurning & itching, uses cortisone   Hypothyroidism    Iron deficiency anemia, unspecified    Lower esophageal ring 2008   EGD   Nonorganic sleep disorder, unspecified    Osteoarthritis    Osteoporosis    Pancreatitis    Personal history of colonic polyps    Polio 1948   Pulmonary sarcoidosis (HCC)    Sleep apnea    [facility]   Vitamin B12 deficiency    Vitamin D deficiency    Past Surgical History:  Procedure Laterality Date   APPENDECTOMY  1988   CARPAL TUNNEL RELEASE  2002   rt   CHOLECYSTECTOMY  2000   COLONOSCOPY  12/19/2006   Multiple diminutive polyps destroyed-removed (hyperpastic), internal hemorrhoids   ESOPHAGOGASTRODUODENOSCOPY  12/19/2006   lower esophageal ring dilated   HAND SURGERY  1997   left   KNEE ARTHROSCOPY Bilateral    multiple 2 on lft 1 on rt   LUMBAR DISC SURGERY  1995   Melanoma Removal  Right 2018   right face   ROTATOR CUFF REPAIR Left    Nov 29, 2021   TOE SURGERY Left    left foot next to little toe  joint rem   TOTAL KNEE ARTHROPLASTY Right  05/09/2014   Procedure: RIGHT TOTAL KNEE ARTHROPLASTY;  Surgeon: Verlee Rossetti, MD;  Location: [facility] OR;  Service: Orthopedics;  Laterality: Right;   TOTAL KNEE ARTHROPLASTY Left 08/06/2021   Procedure: TOTAL KNEE ARTHROPLASTY;  Surgeon: Beverely Low, MD;  Location: [facility];  Service: Orthopedics;  Laterality: Left;   UPPER GASTROINTESTINAL ENDOSCOPY     WISDOM TOOTH EXTRACTION     Patient Active Problem List   Diagnosis Date Noted   Preop exam for internal medicine 06/19/2023   Aortic atherosclerosis (HCC) 10/19/2022   Elevated coronary artery calcium score 10/19/2022   Edema 08/29/2022   Palpitations 01/24/2022   Abdominal hernia 11/24/2021   Anemia 11/24/2021   Asthma 11/24/2021   Decreased estrogen level 11/24/2021   Syncope 11/24/2021   Sphenoid sinusitis 11/24/2021   Rotator cuff tear 11/22/2021   Full thickness rotator cuff tear 09/30/2021   Insomnia 09/23/2020   Dyslipidemia 09/23/2020   RLS (restless legs syndrome) 07/20/2020   Drug side effects, initial encounter 07/20/2020   Coronary atherosclerosis 03/18/2019   Post-traumatic headache, not intractable 12/04/2018   Grief 09/12/2018   Atrophic  vaginitis 09/06/2017   Burning sensation of mouth 09/06/2017   Osteopenia 09/05/2016   Upper respiratory infection 09/05/2016   Chest pain, atypical 03/04/2016   Headache 07/19/2013   Tinnitus 07/19/2013   Dry skin dermatitis 08/31/2012   Left hip pain 08/31/2012   Cerumen impaction 08/31/2012   Well adult exam 05/25/2012   Upper abdominal pain-chronic 04/02/2011   Thumb pain 03/11/2011   Shoulder pain 03/11/2011   Bilateral primary osteoarthritis of knee 09/10/2010   VERTIGO 03/05/2010   OSA on CPAP 07/22/2008   B12 deficiency 08/16/2007   Vitamin D deficiency 08/16/2007   Nonorganic sleep disorder 05/10/2007   SARCOIDOSIS, PULMONARY 05/09/2007   Hypothyroidism 05/09/2007   Allergic rhinitis 05/09/2007   GERD 05/09/2007   History of colonic polyps 05/09/2007    REFERRING PROVIDER: Malon Kindle MD  REFERRING DIAG: S/p right RTC repair.  THERAPY DIAG:  Chronic right shoulder pain  Stiffness of right shoulder, not elsewhere classified  Localized edema  Rationale for Evaluation and Treatment: Rehabilitation  ONSET DATE: 08/28/23 (surgery date).  SUBJECTIVE:                                                                                                                                                                                      SUBJECTIVE STATEMENT: The patient reports RT shldr.2- 3/10. Able to wright and open mail with my right hand now PERTINENT HISTORY: See above.  PAIN:  Are you having pain? Yes: NPRS scale: 2/10. Pain location: Right shoulder. Pain description: Sharp. Aggravating factors: Movement. Relieving factors: Medication.  PRECAUTIONS: Other: Progress per protocol.      WEIGHT BEARING RESTRICTIONS:  No right UE weight bearing.    FALLS:  Has patient fallen in last 6 months? No  LIVING ENVIRONMENT: Lives in: House/apartment Has following equipment at home: None  OCCUPATION: "Semi-Retired."  PLOF: Independent  PATIENT GOALS:Use right UE without pain.  NEXT MD VISIT:   OBJECTIVE:  Note: Objective measures were completed at Evaluation unless otherwise noted.  UPPER EXTREMITY ROM:   In supine:  Gentle right shoulder PROM to 75 degrees of flexion and ER to 10 degrees.  PALPATION:  Right shoulder incision cover with sterile gauze (patient's performed for her).  She c/o diffuse shoulder pain currently and her shoulder is visibly swollen.  TREATMENT DATE: 09/22/23  Manual:   PROM/ PAROM for flexion, ABD, as well as ER with Pt in hook lying on mat PROM flexion to 162 degrees, 62 degrees, and ABD to 112 degrees Vasopneumatic on low x 15 minutes with pillow between right elbow  ans thorax.     PATIENT EDUCATION: Education details: See below (this exercise was part of her hospital discharge packet). Person educated: Patient Education method: Explanation, Demonstration, Tactile cues, Verbal cues, and Handouts Education comprehension: verbalized understanding and returned demonstration  HOME EXERCISE PROGRAM: HOME EXERCISE PROGRAM Created by [place] Applegate Jan 30th, 2025 View at [email or web address] using code: [email or web address]  Page 1 of 1 1 Exercise WAND EXTERNAL ROTATION - SUPINE ER Lie on your back holding a cane or wand with both hands.  On the affected side, place a small rolled up towel or pillow under your elbow. Maintain approx. 90 degree bend at the elbow with your arm approximately 30-45 degrees away from your side. GENTLE. PAIN-FREE Use your other arm to pull the wand/cane to rotate the affected arm back into a stretch. Hold and then return to starting position and then repeat. Repeat 10 Times Hold 15 Seconds Complete 1 Set Perform 4 Times a Day  ASSESSMENT:  CLINICAL IMPRESSION: Pt arrived today with some incrreased soreness RT shldr due to playing  the piano some this weekend. Rx focused on  Manual PROM/ PAROM to RT shldr for elevation, ABD, and ER.  She had improvement in all motions today and  still with good end-feel..  Flexion to 162 degrees, ABD to 120 degrees  and ER to 62 degrees.Vaso end of session RT shldr.     OBJECTIVE IMPAIRMENTS: decreased activity tolerance, decreased ROM, increased edema, and pain.   ACTIVITY LIMITATIONS: carrying, lifting, and reach over head  PARTICIPATION LIMITATIONS: meal prep, cleaning, and laundry  REHAB POTENTIAL: Excellent  CLINICAL DECISION MAKING: Stable/uncomplicated  EVALUATION COMPLEXITY: Low   GOALS:   Target date:  09/14/23.             PT LONG TERM GOAL #1    Title independent with a HEP.     Time 4     Period Weeks     Status Partilly met         PT LONG TERM GOAL #2    Title  Active right  shoulder flexion to 145 degrees so the patient can easily reach overhead.     Time 4     Period Weeks     Status On going         PT LONG TERM GOAL #3    Title Active ER to 70 degrees+ to allow for easily donning/doffing of apparel.     Time 4     Period Weeks     Status On going         PT LONG TERM GOAL #4    Title Increase ROM so patient is able to reach behind back to L3.     Time 4     Period Weeks     Status On going         PT LONG TERM GOAL #5    Title Increase right shoulder strength to a solid 4+/5 to increase stability for performance of functional activities.     Time 4     Period Weeks     Status On going         PT LONG TERM GOAL #6    Title Perform ADL's  with pain not > 2-3/10.     Time 6     Period Weeks     Status On going      PLAN:  PT FREQUENCY: 2x/week  PT DURATION: 6 months  PLANNED INTERVENTIONS: 97110-Therapeutic exercises, 97530- Therapeutic activity, [id number]- Neuromuscular re-education, 97535- Self Care, 16109- Manual therapy, 97014- Electrical stimulation (unattended), 97016- Vasopneumatic device, Cryotherapy, and Moist heat  PLAN FOR NEXT SESSION: Begin with P, P/AROM, vasopneumatic on low with pillow between thorax and elbow.    Shaquia Berkley,CHRIS, PTA 09/25/2023, 11:12 AM

## 2023-09-29 ENCOUNTER — Encounter: Payer: Self-pay | Admitting: *Deleted

## 2023-09-29 ENCOUNTER — Ambulatory Visit: Payer: Medicare Other | Admitting: *Deleted

## 2023-09-29 DIAGNOSIS — R600 Localized edema: Secondary | ICD-10-CM

## 2023-09-29 DIAGNOSIS — M25611 Stiffness of right shoulder, not elsewhere classified: Secondary | ICD-10-CM | POA: Diagnosis not present

## 2023-09-29 DIAGNOSIS — M25511 Pain in right shoulder: Secondary | ICD-10-CM | POA: Diagnosis not present

## 2023-09-29 DIAGNOSIS — G8929 Other chronic pain: Secondary | ICD-10-CM

## 2023-09-29 NOTE — Therapy (Signed)
 OUTPATIENT PHYSICAL THERAPY SHOULDER TREATMENT   Patient Name: Elizabeth Gray MRN: [id number] DOB:Jun 17, 1941, 83 y.o., female Today's Date: 09/29/2023  END OF SESSION:  PT End of Session - 09/29/23 1018     Visit Number 9    Number of Visits 12    Date for PT Re-Evaluation 10/12/23    Authorization Type FOTO AT LEAST EVERY 5TH VISIT.  PROGRESS NOTE AT 10TH VISIT.  KX MODIFIER AFTER 15 VISITS.    PT Start Time 1019              Past Medical History:  Diagnosis Date   Allergy    Bursitis    Cancer (HCC) 03/22/2017   Melanoma in situ, lentigo maligna type   Deviated septum    External hemorrhoids without mention of complication 2008   Colonoscopy    Fatigue    GERD (gastroesophageal reflux disease)    Headache(784.0)    migraines   Heat rash    under the breasts.Marland Kitchenappeared on monday.Marland KitchenMarland KitchenBurning & itching, uses cortisone   Hypothyroidism    Iron deficiency anemia, unspecified    Lower esophageal ring 2008   EGD   Nonorganic sleep disorder, unspecified    Osteoarthritis    Osteoporosis    Pancreatitis    Personal history of colonic polyps    Polio 1948   Pulmonary sarcoidosis (HCC)    Sleep apnea    [facility]ap [facility]   Vitamin B12 deficiency    Vitamin D deficiency    Past Surgical History:  Procedure Laterality Date   APPENDECTOMY  1988   CARPAL TUNNEL RELEASE  2002   rt   CHOLECYSTECTOMY  2000   COLONOSCOPY  12/19/2006   Multiple diminutive polyps destroyed-removed (hyperpastic), internal hemorrhoids   ESOPHAGOGASTRODUODENOSCOPY  12/19/2006   lower esophageal ring dilated   HAND SURGERY  1997   left   KNEE ARTHROSCOPY Bilateral    multiple 2 on lft 1 on rt   LUMBAR DISC SURGERY  1995   Melanoma Removal  Right 2018   right face   ROTATOR CUFF REPAIR Left    Nov 29, 2021   TOE SURGERY Left    left foot next to little toe  joint rem   TOTAL KNEE ARTHROPLASTY Right 05/09/2014   Procedure: RIGHT TOTAL KNEE ARTHROPLASTY;   Surgeon: Verlee Rossetti, MD;  Location: [facility] OR;  Service: Orthopedics;  Laterality: Right;   TOTAL KNEE ARTHROPLASTY Left 08/06/2021   Procedure: TOTAL KNEE ARTHROPLASTY;  Surgeon: Beverely Low, MD;  Location: [facility];  Service: Orthopedics;  Laterality: Left;   UPPER GASTROINTESTINAL ENDOSCOPY     WISDOM TOOTH EXTRACTION     Patient Active Problem List   Diagnosis Date Noted   Preop exam for internal medicine 06/19/2023   Aortic atherosclerosis (HCC) 10/19/2022   Elevated coronary artery calcium score 10/19/2022   Edema 08/29/2022   Palpitations 01/24/2022   Abdominal hernia 11/24/2021   Anemia 11/24/2021   Asthma 11/24/2021   Decreased estrogen level 11/24/2021   Syncope 11/24/2021   Sphenoid sinusitis 11/24/2021   Rotator cuff tear 11/22/2021   Full thickness rotator cuff tear 09/30/2021   Insomnia 09/23/2020   Dyslipidemia 09/23/2020   RLS (restless legs syndrome) 07/20/2020   Drug side effects, initial encounter 07/20/2020   Coronary atherosclerosis 03/18/2019   Post-traumatic headache, not intractable 12/04/2018   Grief 09/12/2018   Atrophic vaginitis 09/06/2017   Burning sensation of mouth 09/06/2017   Osteopenia 09/05/2016   Upper  respiratory infection 09/05/2016   Chest pain, atypical 03/04/2016   Headache 07/19/2013   Tinnitus 07/19/2013   Dry skin dermatitis 08/31/2012   Left hip pain 08/31/2012   Cerumen impaction 08/31/2012   Well adult exam 05/25/2012   Upper abdominal pain-chronic 04/02/2011   Thumb pain 03/11/2011   Shoulder pain 03/11/2011   Bilateral primary osteoarthritis of knee 09/10/2010   VERTIGO 03/05/2010   OSA on CPAP 07/22/2008   B12 deficiency 08/16/2007   Vitamin D deficiency 08/16/2007   Nonorganic sleep disorder 05/10/2007   SARCOIDOSIS, PULMONARY 05/09/2007   Hypothyroidism 05/09/2007   Allergic rhinitis 05/09/2007   GERD 05/09/2007   History of colonic polyps 05/09/2007   REFERRING PROVIDER: Malon Kindle MD  REFERRING DIAG: S/p  right RTC repair.  THERAPY DIAG:  Chronic right shoulder pain  Stiffness of right shoulder, not elsewhere classified  Localized edema  Rationale for Evaluation and Treatment: Rehabilitation  ONSET DATE: 08/28/23 (surgery date).  SUBJECTIVE:                                                                                                                                                                                      SUBJECTIVE STATEMENT: The patient reports RT shldr.2- 3/10. Able to wash under my arm now. RT shldr doing better. PERTINENT HISTORY: See above.  PAIN:  Are you having pain? Yes: NPRS scale: 2/10. Pain location: Right shoulder. Pain description: Sharp. Aggravating factors: Movement. Relieving factors: Medication.  PRECAUTIONS: Other: Progress per protocol.      WEIGHT BEARING RESTRICTIONS:  No right UE weight bearing.    FALLS:  Has patient fallen in last 6 months? No  LIVING ENVIRONMENT: Lives in: House/apartment Has following equipment at home: None  OCCUPATION: "Semi-Retired."  PLOF: Independent  PATIENT GOALS:Use right UE without pain.  NEXT MD VISIT:   OBJECTIVE:  Note: Objective measures were completed at Evaluation unless otherwise noted.  UPPER EXTREMITY ROM:   In supine:  Gentle right shoulder PROM to 75 degrees of flexion and ER to 10 degrees.  PALPATION:  Right shoulder incision cover with sterile gauze (patient's performed for her).  She c/o diffuse shoulder pain currently and her shoulder is visibly swollen.  TREATMENT DATE: 09/29/23  Manual:   PROM/ PAROM for flexion, ABD, as well as ER with Pt in hook lying on mat PROM flexion to 160 degrees, 62 degrees, and ABD to 120 degrees Vasopneumatic on low x 15 minutes with pillow between right elbow ans thorax.     PATIENT EDUCATION: Education details: See  below (this exercise was part of her hospital discharge packet). Person educated: Patient Education method: Explanation, Demonstration, Tactile cues, Verbal cues, and Handouts Education comprehension: verbalized understanding and returned demonstration  HOME EXERCISE PROGRAM: HOME EXERCISE PROGRAM Created by [place] Applegate Jan 30th, 2025 View at [email or web address] using code: [email or web address]  Page 1 of 1 1 Exercise WAND EXTERNAL ROTATION - SUPINE ER Lie on your back holding a cane or wand with both hands.  On the affected side, place a small rolled up towel or pillow under your elbow. Maintain approx. 90 degree bend at the elbow with your arm approximately 30-45 degrees away from your side. GENTLE. PAIN-FREE Use your other arm to pull the wand/cane to rotate the affected arm back into a stretch. Hold and then return to starting position and then repeat. Repeat 10 Times Hold 15 Seconds Complete 1 Set Perform 4 Times a Day  ASSESSMENT:  CLINICAL IMPRESSION: Pt arrived today with some incrreased soreness RT shldr due to playing  the piano some this weekend. Rx focused on  Manual PROM/ PAROM to RT shldr for elevation, ABD, and ER.  She had improvement in all motions today and  still with good end-feel..  Flexion to 162 degrees, ABD to 120 degrees  and ER to 62 degrees.Vaso end of session RT shldr.     OBJECTIVE IMPAIRMENTS: decreased activity tolerance, decreased ROM, increased edema, and pain.   ACTIVITY LIMITATIONS: carrying, lifting, and reach over head  PARTICIPATION LIMITATIONS: meal prep, cleaning, and laundry  REHAB POTENTIAL: Excellent  CLINICAL DECISION MAKING: Stable/uncomplicated  EVALUATION COMPLEXITY: Low   GOALS:   Target date:  09/14/23.             PT LONG TERM GOAL #1    Title independent with a HEP.     Time 4     Period Weeks     Status Partilly met         PT LONG TERM GOAL #2    Title Active right  shoulder flexion to 145 degrees so the patient can  easily reach overhead.     Time 4     Period Weeks     Status On going         PT LONG TERM GOAL #3    Title Active ER to 70 degrees+ to allow for easily donning/doffing of apparel.     Time 4     Period Weeks     Status On going         PT LONG TERM GOAL #4    Title Increase ROM so patient is able to reach behind back to L3.     Time 4     Period Weeks     Status On going         PT LONG TERM GOAL #5    Title Increase right shoulder strength to a solid 4+/5 to increase stability for performance of functional activities.     Time 4     Period Weeks     Status On going         PT LONG TERM GOAL #6    Title Perform ADL's  with pain not > 2-3/10.     Time 6     Period Weeks     Status On going      PLAN:  PT FREQUENCY: 2x/week  PT DURATION: 6 months  PLANNED INTERVENTIONS: 97110-Therapeutic exercises, 97530- Therapeutic activity, [id number]- Neuromuscular re-education, 97535- Self Care, 16109- Manual therapy, 97014- Electrical stimulation (unattended), 97016- Vasopneumatic device, Cryotherapy, and Moist heat  PLAN FOR NEXT SESSION: Begin with P, P/AROM, vasopneumatic on low with pillow between thorax and elbow.    Alaya Iverson,CHRIS, PTA 09/29/2023, 10:19 AM

## 2023-10-02 ENCOUNTER — Ambulatory Visit: Payer: Medicare Other | Attending: Orthopedic Surgery | Admitting: *Deleted

## 2023-10-02 ENCOUNTER — Encounter: Payer: Self-pay | Admitting: *Deleted

## 2023-10-02 DIAGNOSIS — G8929 Other chronic pain: Secondary | ICD-10-CM | POA: Diagnosis not present

## 2023-10-02 DIAGNOSIS — M25511 Pain in right shoulder: Secondary | ICD-10-CM | POA: Insufficient documentation

## 2023-10-02 DIAGNOSIS — M25611 Stiffness of right shoulder, not elsewhere classified: Secondary | ICD-10-CM | POA: Insufficient documentation

## 2023-10-02 DIAGNOSIS — R600 Localized edema: Secondary | ICD-10-CM | POA: Diagnosis not present

## 2023-10-02 NOTE — Therapy (Signed)
 OUTPATIENT PHYSICAL THERAPY SHOULDER TREATMENT   Patient Name: Elizabeth Gray MRN: [id number] DOB:[birth year], 83 y.o., female Today's Date: 10/02/2023  END OF SESSION:  PT End of Session - 10/02/23 1058     Visit Number 10    Number of Visits 12    Date for PT Re-Evaluation 10/12/23    Authorization Type FOTO AT LEAST EVERY 5TH VISIT.  PROGRESS NOTE AT 10TH VISIT.  KX MODIFIER AFTER 15 VISITS.    PT Start Time 1015    PT Stop Time 1108    PT Time Calculation (min) 53 min              Past Medical History:  Diagnosis Date   Allergy    Bursitis    Cancer (HCC) 03/22/2017   Melanoma in situ, lentigo maligna type   Deviated septum    External hemorrhoids without mention of complication 2008   Colonoscopy    Fatigue    GERD (gastroesophageal reflux disease)    Headache(784.0)    migraines   Heat rash    under the breasts.Marland Kitchenappeared on monday.Marland KitchenMarland KitchenBurning & itching, uses cortisone   Hypothyroidism    Iron deficiency anemia, unspecified    Lower esophageal ring 2008   EGD   Nonorganic sleep disorder, unspecified    Osteoarthritis    Osteoporosis    Pancreatitis    Personal history of colonic polyps    Polio 1948   Pulmonary sarcoidosis (HCC)    Sleep apnea    [facility]ap [facility]   Vitamin B12 deficiency    Vitamin D deficiency    Past Surgical History:  Procedure Laterality Date   APPENDECTOMY  1988   CARPAL TUNNEL RELEASE  2002   rt   CHOLECYSTECTOMY  2000   COLONOSCOPY  12/19/2006   Multiple diminutive polyps destroyed-removed (hyperpastic), internal hemorrhoids   ESOPHAGOGASTRODUODENOSCOPY  12/19/2006   lower esophageal ring dilated   HAND SURGERY  1997   left   KNEE ARTHROSCOPY Bilateral    multiple 2 on lft 1 on rt   LUMBAR DISC SURGERY  1995   Melanoma Removal  Right 2018   right face   ROTATOR CUFF REPAIR Left    Nov 29, 2021   TOE SURGERY Left    left foot next to little toe  joint rem   TOTAL KNEE ARTHROPLASTY Right  05/09/2014   Procedure: RIGHT TOTAL KNEE ARTHROPLASTY;  Surgeon: Verlee Rossetti, MD;  Location: [facility] OR;  Service: Orthopedics;  Laterality: Right;   TOTAL KNEE ARTHROPLASTY Left 08/06/2021   Procedure: TOTAL KNEE ARTHROPLASTY;  Surgeon: Beverely Low, MD;  Location: [facility];  Service: Orthopedics;  Laterality: Left;   UPPER GASTROINTESTINAL ENDOSCOPY     WISDOM TOOTH EXTRACTION     Patient Active Problem List   Diagnosis Date Noted   Preop exam for internal medicine 06/19/2023   Aortic atherosclerosis (HCC) 10/19/2022   Elevated coronary artery calcium score 10/19/2022   Edema 08/29/2022   Palpitations 01/24/2022   Abdominal hernia 11/24/2021   Anemia 11/24/2021   Asthma 11/24/2021   Decreased estrogen level 11/24/2021   Syncope 11/24/2021   Sphenoid sinusitis 11/24/2021   Rotator cuff tear 11/22/2021   Full thickness rotator cuff tear 09/30/2021   Insomnia 09/23/2020   Dyslipidemia 09/23/2020   RLS (restless legs syndrome) 07/20/2020   Drug side effects, initial encounter 07/20/2020   Coronary atherosclerosis 03/18/2019   Post-traumatic headache, not intractable 12/04/2018   Grief 09/12/2018   Atrophic  vaginitis 09/06/2017   Burning sensation of mouth 09/06/2017   Osteopenia 09/05/2016   Upper respiratory infection 09/05/2016   Chest pain, atypical 03/04/2016   Headache 07/19/2013   Tinnitus 07/19/2013   Dry skin dermatitis 08/31/2012   Left hip pain 08/31/2012   Cerumen impaction 08/31/2012   Well adult exam 05/25/2012   Upper abdominal pain-chronic 04/02/2011   Thumb pain 03/11/2011   Shoulder pain 03/11/2011   Bilateral primary osteoarthritis of knee 09/10/2010   VERTIGO 03/05/2010   OSA on CPAP 07/22/2008   B12 deficiency 08/16/2007   Vitamin D deficiency 08/16/2007   Nonorganic sleep disorder 05/10/2007   SARCOIDOSIS, PULMONARY 05/09/2007   Hypothyroidism 05/09/2007   Allergic rhinitis 05/09/2007   GERD 05/09/2007   History of colonic polyps 05/09/2007    REFERRING PROVIDER: Malon Kindle MD  REFERRING DIAG: S/p right RTC repair.  THERAPY DIAG:  Chronic right shoulder pain  Stiffness of right shoulder, not elsewhere classified  Localized edema  Rationale for Evaluation and Treatment: Rehabilitation  ONSET DATE: 08/28/23 (surgery date).  SUBJECTIVE:                                                                                                                                                                                      SUBJECTIVE STATEMENT: The patient reports RT shldr.5/10 this AM from  folding papers and playing the piano. Sore today   PERTINENT HISTORY: See above.  PAIN:  Are you having pain? Yes: NPRS scale: 5/10. Pain location: Right shoulder. Pain description: Sharp. Aggravating factors: Movement. Relieving factors: Medication.  PRECAUTIONS: Other: Progress per protocol.      WEIGHT BEARING RESTRICTIONS:  No right UE weight bearing.    FALLS:  Has patient fallen in last 6 months? No  LIVING ENVIRONMENT: Lives in: House/apartment Has following equipment at home: None  OCCUPATION: "Semi-Retired."  PLOF: Independent  PATIENT GOALS:Use right UE without pain.  NEXT MD VISIT:   OBJECTIVE:  Note: Objective measures were completed at Evaluation unless otherwise noted.  UPPER EXTREMITY ROM:   In supine:  Gentle right shoulder PROM to 75 degrees of flexion and ER to 10 degrees.  PALPATION:  Right shoulder incision cover with sterile gauze (patient's performed for her).  She c/o diffuse shoulder pain currently and her shoulder is visibly swollen.  TREATMENT DATE: 10/02/23  Manual:   PROM/ PAROM for flexion, ABD, as well as ER with Pt in hook lying on mat and feet elevated PROM flexion to 160 degrees, 62 degrees, and ABD to 120 degrees still today Vasopneumatic on low x 15  minutes with pillow between right elbow ans thorax.     PATIENT EDUCATION: Education details: See below (this exercise was part of her hospital discharge packet). Person educated: Patient Education method: Explanation, Demonstration, Tactile cues, Verbal cues, and Handouts Education comprehension: verbalized understanding and returned demonstration  HOME EXERCISE PROGRAM: HOME EXERCISE PROGRAM Created by [place] Applegate Jan 30th, 2025 View at [email or web address] using code: [email or web address]  Page 1 of 1 1 Exercise WAND EXTERNAL ROTATION - SUPINE ER Lie on your back holding a cane or wand with both hands.  On the affected side, place a small rolled up towel or pillow under your elbow. Maintain approx. 90 degree bend at the elbow with your arm approximately 30-45 degrees away from your side. GENTLE. PAIN-FREE Use your other arm to pull the wand/cane to rotate the affected arm back into a stretch. Hold and then return to starting position and then repeat. Repeat 10 Times Hold 15 Seconds Complete 1 Set Perform 4 Times a Day  ASSESSMENT:  CLINICAL IMPRESSION: Pt arrived today with some increased soreness RT shldr 5/10 due to folding papers and  playing  the piano some this weekend. Rx focused on  Manual PROM/ PAROM to RT shldr for elevation, ABD, and ER again. Flexion to 162 degrees, ABD to 120 degrees  and ER to 62 degrees still today even with increased soreness..Vaso end of session RT shldr.  LTG's NM due to Pt. Protocol precautions.      OBJECTIVE IMPAIRMENTS: decreased activity tolerance, decreased ROM, increased edema, and pain.   ACTIVITY LIMITATIONS: carrying, lifting, and reach over head  PARTICIPATION LIMITATIONS: meal prep, cleaning, and laundry  REHAB POTENTIAL: Excellent  CLINICAL DECISION MAKING: Stable/uncomplicated  EVALUATION COMPLEXITY: Low   GOALS:   Target date:  09/14/23.             PT LONG TERM GOAL #1    Title independent with a HEP.     Time 4      Period Weeks     Status Partilly met         PT LONG TERM GOAL #2    Title Active right  shoulder flexion to 145 degrees so the patient can easily reach overhead.     Time 4     Period Weeks     Status On going         PT LONG TERM GOAL #3    Title Active ER to 70 degrees+ to allow for easily donning/doffing of apparel.     Time 4     Period Weeks     Status On going         PT LONG TERM GOAL #4    Title Increase ROM so patient is able to reach behind back to L3.     Time 4     Period Weeks     Status On going         PT LONG TERM GOAL #5    Title Increase right shoulder strength to a solid 4+/5 to increase stability for performance of functional activities.     Time 4     Period Weeks     Status On going  PT LONG TERM GOAL #6    Title Perform ADL's with pain not > 2-3/10.     Time 6     Period Weeks     Status On going      PLAN:  PT FREQUENCY: 2x/week  PT DURATION: 6 months  PLANNED INTERVENTIONS: 97110-Therapeutic exercises, 97530- Therapeutic activity, [id number]- Neuromuscular re-education, 97535- Self Care, 16109- Manual therapy, 97014- Electrical stimulation (unattended), 97016- Vasopneumatic device, Cryotherapy, and Moist heat  PLAN FOR NEXT SESSION: Begin with P, P/AROM, vasopneumatic on low with pillow between thorax and elbow.    Chiquita Heckert,CHRIS, PTA 10/02/2023, 11:17 AM

## 2023-10-03 ENCOUNTER — Other Ambulatory Visit: Payer: Self-pay | Admitting: Physician Assistant

## 2023-10-05 DIAGNOSIS — D485 Neoplasm of uncertain behavior of skin: Secondary | ICD-10-CM | POA: Diagnosis not present

## 2023-10-05 DIAGNOSIS — C4339 Malignant melanoma of other parts of face: Secondary | ICD-10-CM | POA: Diagnosis not present

## 2023-10-05 DIAGNOSIS — L821 Other seborrheic keratosis: Secondary | ICD-10-CM | POA: Diagnosis not present

## 2023-10-05 DIAGNOSIS — D0339 Melanoma in situ of other parts of face: Secondary | ICD-10-CM | POA: Diagnosis not present

## 2023-10-09 ENCOUNTER — Ambulatory Visit: Admitting: *Deleted

## 2023-10-09 DIAGNOSIS — M25611 Stiffness of right shoulder, not elsewhere classified: Secondary | ICD-10-CM | POA: Diagnosis not present

## 2023-10-09 DIAGNOSIS — G8929 Other chronic pain: Secondary | ICD-10-CM | POA: Diagnosis not present

## 2023-10-09 DIAGNOSIS — R600 Localized edema: Secondary | ICD-10-CM

## 2023-10-09 DIAGNOSIS — M25511 Pain in right shoulder: Secondary | ICD-10-CM | POA: Diagnosis not present

## 2023-10-09 NOTE — Addendum Note (Signed)
 Addended by: Jamesia Linnen, [place] W on: 10/09/2023 12:01 PM   Modules accepted: Orders

## 2023-10-09 NOTE — Therapy (Signed)
 OUTPATIENT PHYSICAL THERAPY SHOULDER TREATMENT   Patient Name: IDELLA LAMONTAGNE MRN: [id number] DOB:1941/06/21, 83 y.o., female Today's Date: 10/09/2023  END OF SESSION:  PT End of Session - 10/09/23 1016     Visit Number 11    Number of Visits 12    Date for PT Re-Evaluation 10/12/23    Authorization Type FOTO AT LEAST EVERY 5TH VISIT.  PROGRESS NOTE AT 10TH VISIT.  KX MODIFIER AFTER 15 VISITS.    PT Start Time 1016    PT Stop Time 1105    PT Time Calculation (min) 49 min              Past Medical History:  Diagnosis Date   Allergy    Bursitis    Cancer (HCC) 03/22/2017   Melanoma in situ, lentigo maligna type   Deviated septum    External hemorrhoids without mention of complication 2008   Colonoscopy    Fatigue    GERD (gastroesophageal reflux disease)    Headache(784.0)    migraines   Heat rash    under the breasts.Marland Kitchenappeared on monday.Marland KitchenMarland KitchenBurning & itching, uses cortisone   Hypothyroidism    Iron deficiency anemia, unspecified    Lower esophageal ring 2008   EGD   Nonorganic sleep disorder, unspecified    Osteoarthritis    Osteoporosis    Pancreatitis    Personal history of colonic polyps    Polio 1948   Pulmonary sarcoidosis (HCC)    Sleep apnea    [facility]ap [facility]   Vitamin B12 deficiency    Vitamin D deficiency    Past Surgical History:  Procedure Laterality Date   APPENDECTOMY  1988   CARPAL TUNNEL RELEASE  2002   rt   CHOLECYSTECTOMY  2000   COLONOSCOPY  12/19/2006   Multiple diminutive polyps destroyed-removed (hyperpastic), internal hemorrhoids   ESOPHAGOGASTRODUODENOSCOPY  12/19/2006   lower esophageal ring dilated   HAND SURGERY  1997   left   KNEE ARTHROSCOPY Bilateral    multiple 2 on lft 1 on rt   LUMBAR DISC SURGERY  1995   Melanoma Removal  Right 2018   right face   ROTATOR CUFF REPAIR Left    Nov 29, 2021   TOE SURGERY Left    left foot next to little toe  joint rem   TOTAL KNEE ARTHROPLASTY Right  05/09/2014   Procedure: RIGHT TOTAL KNEE ARTHROPLASTY;  Surgeon: Verlee Rossetti, MD;  Location: [facility] OR;  Service: Orthopedics;  Laterality: Right;   TOTAL KNEE ARTHROPLASTY Left 08/06/2021   Procedure: TOTAL KNEE ARTHROPLASTY;  Surgeon: Beverely Low, MD;  Location: [facility];  Service: Orthopedics;  Laterality: Left;   UPPER GASTROINTESTINAL ENDOSCOPY     WISDOM TOOTH EXTRACTION     Patient Active Problem List   Diagnosis Date Noted   Preop exam for internal medicine 06/19/2023   Aortic atherosclerosis (HCC) 10/19/2022   Elevated coronary artery calcium score 10/19/2022   Edema 08/29/2022   Palpitations 01/24/2022   Abdominal hernia 11/24/2021   Anemia 11/24/2021   Asthma 11/24/2021   Decreased estrogen level 11/24/2021   Syncope 11/24/2021   Sphenoid sinusitis 11/24/2021   Rotator cuff tear 11/22/2021   Full thickness rotator cuff tear 09/30/2021   Insomnia 09/23/2020   Dyslipidemia 09/23/2020   RLS (restless legs syndrome) 07/20/2020   Drug side effects, initial encounter 07/20/2020   Coronary atherosclerosis 03/18/2019   Post-traumatic headache, not intractable 12/04/2018   Grief 09/12/2018   Atrophic  vaginitis 09/06/2017   Burning sensation of mouth 09/06/2017   Osteopenia 09/05/2016   Upper respiratory infection 09/05/2016   Chest pain, atypical 03/04/2016   Headache 07/19/2013   Tinnitus 07/19/2013   Dry skin dermatitis 08/31/2012   Left hip pain 08/31/2012   Cerumen impaction 08/31/2012   Well adult exam 05/25/2012   Upper abdominal pain-chronic 04/02/2011   Thumb pain 03/11/2011   Shoulder pain 03/11/2011   Bilateral primary osteoarthritis of knee 09/10/2010   VERTIGO 03/05/2010   OSA on CPAP 07/22/2008   B12 deficiency 08/16/2007   Vitamin D deficiency 08/16/2007   Nonorganic sleep disorder 05/10/2007   SARCOIDOSIS, PULMONARY 05/09/2007   Hypothyroidism 05/09/2007   Allergic rhinitis 05/09/2007   GERD 05/09/2007   History of colonic polyps 05/09/2007    REFERRING PROVIDER: Malon Kindle MD  REFERRING DIAG: S/p right RTC repair.  THERAPY DIAG:  Chronic right shoulder pain  Stiffness of right shoulder, not elsewhere classified  Localized edema  Rationale for Evaluation and Treatment: Rehabilitation  ONSET DATE: 08/28/23 (surgery date).  SUBJECTIVE:            Back to MD 11-16-23                                                                                                                                                                           SUBJECTIVE STATEMENT: The patient reports RT shldr. 2/10 this AM MD visit went well with new order to start progression and add light tband strengthening close to body.  PERTINENT HISTORY: See above.  PAIN:  Are you having pain? Yes: NPRS scale: 5/10. Pain location: Right shoulder. Pain description: Sharp. Aggravating factors: Movement. Relieving factors: Medication.  PRECAUTIONS: Other: Progress per protocol.      WEIGHT BEARING RESTRICTIONS:  No right UE weight bearing.    FALLS:  Has patient fallen in last 6 months? No  LIVING ENVIRONMENT: Lives in: House/apartment Has following equipment at home: None  OCCUPATION: "Semi-Retired."  PLOF: Independent  PATIENT GOALS:Use right UE without pain.  NEXT MD VISIT:   OBJECTIVE:  Note: Objective measures were completed at Evaluation unless otherwise noted.  UPPER EXTREMITY ROM:   In supine:  Gentle right shoulder PROM to 75 degrees of flexion and ER to 10 degrees.  PALPATION:  Right shoulder incision cover with sterile gauze (patient's performed for her).  She c/o diffuse shoulder pain currently and her shoulder is visibly swollen.  TREATMENT DATE: 10/02/23 Pulleys x 5 mins Seated ranger x 5 mins CW/CCW and front/back Manual:   PROM/ PAROM for flexion, ABD, as well as ER with Pt in hook lying  on mat and feet elevated PROM flexion to 160 degrees, 62 degrees, and ABD to 120 degrees still today Vasopneumatic on low x 15 minutes with pillow between right elbow ans thorax.     PATIENT EDUCATION: Education details: See below (this exercise was part of her hospital discharge packet). Person educated: Patient Education method: Explanation, Demonstration, Tactile cues, Verbal cues, and Handouts Education comprehension: verbalized understanding and returned demonstration  HOME EXERCISE PROGRAM: HOME EXERCISE PROGRAM Created by [place] Applegate Jan 30th, 2025 View at [email or web address] using code: [email or web address]  Page 1 of 1 1 Exercise WAND EXTERNAL ROTATION - SUPINE ER Lie on your back holding a cane or wand with both hands.  On the affected side, place a small rolled up towel or pillow under your elbow. Maintain approx. 90 degree bend at the elbow with your arm approximately 30-45 degrees away from your side. GENTLE. PAIN-FREE Use your other arm to pull the wand/cane to rotate the affected arm back into a stretch. Hold and then return to starting position and then repeat. Repeat 10 Times Hold 15 Seconds Complete 1 Set Perform 4 Times a Day  ASSESSMENT:  CLINICAL IMPRESSION: Pt arrived today doing fairly well with RT shldr and reports MD visit went well. DC sling and new order to progress per protocol and can add tband strengthening next to body. See new Order. Pt did well with AAROM and manual techniques today.   OBJECTIVE IMPAIRMENTS: decreased activity tolerance, decreased ROM, increased edema, and pain.   ACTIVITY LIMITATIONS: carrying, lifting, and reach over head  PARTICIPATION LIMITATIONS: meal prep, cleaning, and laundry  REHAB POTENTIAL: Excellent  CLINICAL DECISION MAKING: Stable/uncomplicated  EVALUATION COMPLEXITY: Low   GOALS:   Target date:  09/14/23.             PT LONG TERM GOAL #1    Title independent with a HEP.     Time 4     Period Weeks      Status Partilly met         PT LONG TERM GOAL #2    Title Active right  shoulder flexion to 145 degrees so the patient can easily reach overhead.     Time 4     Period Weeks     Status On going         PT LONG TERM GOAL #3    Title Active ER to 70 degrees+ to allow for easily donning/doffing of apparel.     Time 4     Period Weeks     Status On going         PT LONG TERM GOAL #4    Title Increase ROM so patient is able to reach behind back to L3.     Time 4     Period Weeks     Status On going         PT LONG TERM GOAL #5    Title Increase right shoulder strength to a solid 4+/5 to increase stability for performance of functional activities.     Time 4     Period Weeks     Status On going         PT LONG TERM GOAL #6    Title Perform ADL's with pain not > 2-3/10.  Time 6     Period Weeks     Status On going      PLAN:  PT FREQUENCY: 2x/week  PT DURATION: 6 months  PLANNED INTERVENTIONS: 97110-Therapeutic exercises, 97530- Therapeutic activity, 97112- Neuromuscular re-education, 97535- Self Care, 16109- Manual therapy, 97014- Electrical stimulation (unattended), 97016- Vasopneumatic device, Cryotherapy, and Moist heat  PLAN FOR NEXT SESSION: N.O.  AAROM and light strengthening next to body with yellow tband. vasopneumatic on low with pillow between thorax and elbow.    Teodora Baumgarten,CHRIS, PTA 10/09/2023, 11:17 AM

## 2023-10-13 ENCOUNTER — Ambulatory Visit: Admitting: *Deleted

## 2023-10-13 ENCOUNTER — Encounter: Payer: Self-pay | Admitting: *Deleted

## 2023-10-13 DIAGNOSIS — M25611 Stiffness of right shoulder, not elsewhere classified: Secondary | ICD-10-CM

## 2023-10-13 DIAGNOSIS — G8929 Other chronic pain: Secondary | ICD-10-CM | POA: Diagnosis not present

## 2023-10-13 DIAGNOSIS — M25511 Pain in right shoulder: Secondary | ICD-10-CM | POA: Diagnosis not present

## 2023-10-13 DIAGNOSIS — R600 Localized edema: Secondary | ICD-10-CM | POA: Diagnosis not present

## 2023-10-13 NOTE — Therapy (Signed)
 OUTPATIENT PHYSICAL THERAPY SHOULDER TREATMENT   Patient Name: Elizabeth Gray MRN: [id number] DOB:[birth year], 83 y.o., female Today's Date: 10/13/2023  END OF SESSION:  PT End of Session - 10/13/23 1013     Visit Number 12    Number of Visits 20    Date for PT Re-Evaluation 11/10/23    Authorization Type FOTO AT LEAST EVERY 5TH VISIT.  PROGRESS NOTE AT 10TH VISIT.  KX MODIFIER AFTER 15 VISITS.    PT Start Time 1015    PT Stop Time 1058    PT Time Calculation (min) 43 min              Past Medical History:  Diagnosis Date   Allergy    Bursitis    Cancer (HCC) 03/22/2017   Melanoma in situ, lentigo maligna type   Deviated septum    External hemorrhoids without mention of complication 2008   Colonoscopy    Fatigue    GERD (gastroesophageal reflux disease)    Headache(784.0)    migraines   Heat rash    under the breasts.Marland Kitchenappeared on monday.Marland KitchenMarland KitchenBurning & itching, uses cortisone   Hypothyroidism    Iron deficiency anemia, unspecified    Lower esophageal ring 2008   EGD   Nonorganic sleep disorder, unspecified    Osteoarthritis    Osteoporosis    Pancreatitis    Personal history of colonic polyps    Polio 1948   Pulmonary sarcoidosis (HCC)    Sleep apnea    [facility]ap [facility]   Vitamin B12 deficiency    Vitamin D deficiency    Past Surgical History:  Procedure Laterality Date   APPENDECTOMY  1988   CARPAL TUNNEL RELEASE  2002   rt   CHOLECYSTECTOMY  2000   COLONOSCOPY  12/19/2006   Multiple diminutive polyps destroyed-removed (hyperpastic), internal hemorrhoids   ESOPHAGOGASTRODUODENOSCOPY  12/19/2006   lower esophageal ring dilated   HAND SURGERY  1997   left   KNEE ARTHROSCOPY Bilateral    multiple 2 on lft 1 on rt   LUMBAR DISC SURGERY  1995   Melanoma Removal  Right 2018   right face   ROTATOR CUFF REPAIR Left    Nov 29, 2021   TOE SURGERY Left    left foot next to little toe  joint rem   TOTAL KNEE ARTHROPLASTY Right  05/09/2014   Procedure: RIGHT TOTAL KNEE ARTHROPLASTY;  Surgeon: Verlee Rossetti, MD;  Location: [facility];  Service: Orthopedics;  Laterality: Right;   TOTAL KNEE ARTHROPLASTY Left 08/06/2021   Procedure: TOTAL KNEE ARTHROPLASTY;  Surgeon: Beverely Low, MD;  Location: [facility];  Service: Orthopedics;  Laterality: Left;   UPPER GASTROINTESTINAL ENDOSCOPY     WISDOM TOOTH EXTRACTION     Patient Active Problem List   Diagnosis Date Noted   Preop exam for internal medicine 06/19/2023   Aortic atherosclerosis (HCC) 10/19/2022   Elevated coronary artery calcium score 10/19/2022   Edema 08/29/2022   Palpitations 01/24/2022   Abdominal hernia 11/24/2021   Anemia 11/24/2021   Asthma 11/24/2021   Decreased estrogen level 11/24/2021   Syncope 11/24/2021   Sphenoid sinusitis 11/24/2021   Rotator cuff tear 11/22/2021   Full thickness rotator cuff tear 09/30/2021   Insomnia 09/23/2020   Dyslipidemia 09/23/2020   RLS (restless legs syndrome) 07/20/2020   Drug side effects, initial encounter 07/20/2020   Coronary atherosclerosis 03/18/2019   Post-traumatic headache, not intractable 12/04/2018   Grief 09/12/2018   Atrophic  vaginitis 09/06/2017   Burning sensation of mouth 09/06/2017   Osteopenia 09/05/2016   Upper respiratory infection 09/05/2016   Chest pain, atypical 03/04/2016   Headache 07/19/2013   Tinnitus 07/19/2013   Dry skin dermatitis 08/31/2012   Left hip pain 08/31/2012   Cerumen impaction 08/31/2012   Well adult exam 05/25/2012   Upper abdominal pain-chronic 04/02/2011   Thumb pain 03/11/2011   Shoulder pain 03/11/2011   Bilateral primary osteoarthritis of knee 09/10/2010   VERTIGO 03/05/2010   OSA on CPAP 07/22/2008   B12 deficiency 08/16/2007   Vitamin D deficiency 08/16/2007   Nonorganic sleep disorder 05/10/2007   SARCOIDOSIS, PULMONARY 05/09/2007   Hypothyroidism 05/09/2007   Allergic rhinitis 05/09/2007   GERD 05/09/2007   History of colonic polyps 05/09/2007    REFERRING PROVIDER: Malon Kindle MD  REFERRING DIAG: S/p right RTC repair.  THERAPY DIAG:  Chronic right shoulder pain  Stiffness of right shoulder, not elsewhere classified  Localized edema  Rationale for Evaluation and Treatment: Rehabilitation  ONSET DATE: 08/28/23 (surgery date).  SUBJECTIVE:            Back to MD 11-16-23                                                                                                                                                                           SUBJECTIVE STATEMENT: The patient reports RT shldr. 2/10 this AM. Doing HEP. RT shldr felt good after last Rx . Biopsy was + for melanoma near my eye.  PERTINENT HISTORY: See above.  PAIN:  Are you having pain? Yes: NPRS scale: 2/10. Pain location: Right shoulder. Pain description: Sharp. Aggravating factors: Movement. Relieving factors: Medication.  PRECAUTIONS: Other: Progress per protocol.      WEIGHT BEARING RESTRICTIONS:  No right UE weight bearing.    FALLS:  Has patient fallen in last 6 months? No  LIVING ENVIRONMENT: Lives in: House/apartment Has following equipment at home: None  OCCUPATION: "Semi-Retired."  PLOF: Independent  PATIENT GOALS:Use right UE without pain.  NEXT MD VISIT:   OBJECTIVE:  Note: Objective measures were completed at Evaluation unless otherwise noted.  UPPER EXTREMITY ROM:   In supine:  Gentle right shoulder PROM to 75 degrees of flexion and ER to 10 degrees.  PALPATION:  Right shoulder incision cover with sterile gauze (patient's performed for her).  She c/o diffuse shoulder pain currently and her shoulder is visibly swollen.  TREATMENT DATE: 10/13/23    7weeks   10-16-23  RT shldr Pulleys x 5 mins Seated ranger x 5 mins CW/CCW and front/back Wall ladder x 5  max  number 21 Manual:   PROM/ PAROM for  flexion, ABD, as well as ER as well as rhythmic stab. for with Pt in hook lying on mat and feet elevated PROM flexion to 160 degrees, 64 degrees, and ABD to 120 degrees still today Vasopneumatic on low x 15 minutes with pillow between right elbow ans thorax.     PATIENT EDUCATION: Education details: See below (this exercise was part of her hospital discharge packet). Person educated: Patient Education method: Explanation, Demonstration, Tactile cues, Verbal cues, and Handouts Education comprehension: verbalized understanding and returned demonstration  HOME EXERCISE PROGRAM: HOME EXERCISE PROGRAM Created by [place] Applegate Jan 30th, 2025 View at [email or web address] using code: [email or web address]  Page 1 of 1 1 Exercise WAND EXTERNAL ROTATION - SUPINE ER Lie on your back holding a cane or wand with both hands.  On the affected side, place a small rolled up towel or pillow under your elbow. Maintain approx. 90 degree bend at the elbow with your arm approximately 30-45 degrees away from your side. GENTLE. PAIN-FREE Use your other arm to pull the wand/cane to rotate the affected arm back into a stretch. Hold and then return to starting position and then repeat. Repeat 10 Times Hold 15 Seconds Complete 1 Set Perform 4 Times a Day  ASSESSMENT:  CLINICAL IMPRESSION: Pt arrived today doing fairly well with RT shldr and was able to continue with AAROM exs as  well as PROM/ AAROM all motions and did well. No Vaso today as per Pt due to needing to leave early   OBJECTIVE IMPAIRMENTS: decreased activity tolerance, decreased ROM, increased edema, and pain.   ACTIVITY LIMITATIONS: carrying, lifting, and reach over head  PARTICIPATION LIMITATIONS: meal prep, cleaning, and laundry  REHAB POTENTIAL: Excellent  CLINICAL DECISION MAKING: Stable/uncomplicated  EVALUATION COMPLEXITY: Low   GOALS:   Target date:  09/14/23.             PT LONG TERM GOAL #1    Title independent with a HEP.      Time 4     Period Weeks     Status Partilly met         PT LONG TERM GOAL #2    Title Active right  shoulder flexion to 145 degrees so the patient can easily reach overhead.     Time 4     Period Weeks     Status On going         PT LONG TERM GOAL #3    Title Active ER to 70 degrees+ to allow for easily donning/doffing of apparel.     Time 4     Period Weeks     Status On going         PT LONG TERM GOAL #4    Title Increase ROM so patient is able to reach behind back to L3.     Time 4     Period Weeks     Status On going         PT LONG TERM GOAL #5    Title Increase right shoulder strength to a solid 4+/5 to increase stability for performance of functional activities.     Time 4     Period Weeks     Status On going  PT LONG TERM GOAL #6    Title Perform ADL's with pain not > 2-3/10.     Time 6     Period Weeks     Status On going      PLAN:  PT FREQUENCY: 2x/week  PT DURATION: 6 months  PLANNED INTERVENTIONS: 97110-Therapeutic exercises, 97530- Therapeutic activity, 97112- Neuromuscular re-education, 97535- Self Care, 40981- Manual therapy, 97014- Electrical stimulation (unattended), 97016- Vasopneumatic device, Cryotherapy, and Moist heat  PLAN FOR NEXT SESSION: N.O.  AAROM and light strengthening next to body with yellow tband. vasopneumatic on low with pillow between thorax and elbow.    Reno Clasby,CHRIS, PTA 10/13/2023, 11:09 AM

## 2023-10-16 ENCOUNTER — Other Ambulatory Visit: Payer: Self-pay | Admitting: Physician Assistant

## 2023-10-16 ENCOUNTER — Other Ambulatory Visit: Payer: Self-pay | Admitting: Internal Medicine

## 2023-10-16 ENCOUNTER — Ambulatory Visit: Admitting: *Deleted

## 2023-10-16 DIAGNOSIS — M25611 Stiffness of right shoulder, not elsewhere classified: Secondary | ICD-10-CM | POA: Diagnosis not present

## 2023-10-16 DIAGNOSIS — M25511 Pain in right shoulder: Secondary | ICD-10-CM | POA: Diagnosis not present

## 2023-10-16 DIAGNOSIS — G8929 Other chronic pain: Secondary | ICD-10-CM

## 2023-10-16 DIAGNOSIS — R600 Localized edema: Secondary | ICD-10-CM

## 2023-10-16 NOTE — Therapy (Signed)
 OUTPATIENT PHYSICAL THERAPY SHOULDER TREATMENT   Patient Name: Elizabeth Gray MRN: [id number] DOB:[birth year], 83 y.o., female Today's Date: 10/16/2023  END OF SESSION:  PT End of Session - 10/16/23 1020     Visit Number 13    Number of Visits 20    Date for PT Re-Evaluation 11/10/23    Authorization Type FOTO AT LEAST EVERY 5TH VISIT.  PROGRESS NOTE AT 10TH VISIT.  KX MODIFIER AFTER 15 VISITS.    PT Start Time 1015    PT Stop Time 1106    PT Time Calculation (min) 51 min              Past Medical History:  Diagnosis Date   Allergy    Bursitis    Cancer (HCC) 03/22/2017   Melanoma in situ, lentigo maligna type   Deviated septum    External hemorrhoids without mention of complication 2008   Colonoscopy    Fatigue    GERD (gastroesophageal reflux disease)    Headache(784.0)    migraines   Heat rash    under the breasts.Marland Kitchenappeared on monday.Marland KitchenMarland KitchenBurning & itching, uses cortisone   Hypothyroidism    Iron deficiency anemia, unspecified    Lower esophageal ring 2008   EGD   Nonorganic sleep disorder, unspecified    Osteoarthritis    Osteoporosis    Pancreatitis    Personal history of colonic polyps    Polio 1948   Pulmonary sarcoidosis (HCC)    Sleep apnea    cpap [facility]   Vitamin B12 deficiency    Vitamin D deficiency    Past Surgical History:  Procedure Laterality Date   APPENDECTOMY  1988   CARPAL TUNNEL RELEASE  2002   rt   CHOLECYSTECTOMY  2000   COLONOSCOPY  12/19/2006   Multiple diminutive polyps destroyed-removed (hyperpastic), internal hemorrhoids   ESOPHAGOGASTRODUODENOSCOPY  12/19/2006   lower esophageal ring dilated   HAND SURGERY  1997   left   KNEE ARTHROSCOPY Bilateral    multiple 2 on lft 1 on rt   LUMBAR DISC SURGERY  1995   Melanoma Removal  Right 2018   right face   ROTATOR CUFF REPAIR Left    Nov 29, 2021   TOE SURGERY Left    left foot next to little toe  joint rem   TOTAL KNEE ARTHROPLASTY Right  05/09/2014   Procedure: RIGHT TOTAL KNEE ARTHROPLASTY;  Surgeon: Verlee Rossetti, MD;  Location: [facility] OR;  Service: Orthopedics;  Laterality: Right;   TOTAL KNEE ARTHROPLASTY Left 08/06/2021   Procedure: TOTAL KNEE ARTHROPLASTY;  Surgeon: Beverely Low, MD;  Location: [facility];  Service: Orthopedics;  Laterality: Left;   UPPER GASTROINTESTINAL ENDOSCOPY     WISDOM TOOTH EXTRACTION     Patient Active Problem List   Diagnosis Date Noted   Preop exam for internal medicine 06/19/2023   Aortic atherosclerosis (HCC) 10/19/2022   Elevated coronary artery calcium score 10/19/2022   Edema 08/29/2022   Palpitations 01/24/2022   Abdominal hernia 11/24/2021   Anemia 11/24/2021   Asthma 11/24/2021   Decreased estrogen level 11/24/2021   Syncope 11/24/2021   Sphenoid sinusitis 11/24/2021   Rotator cuff tear 11/22/2021   Full thickness rotator cuff tear 09/30/2021   Insomnia 09/23/2020   Dyslipidemia 09/23/2020   RLS (restless legs syndrome) 07/20/2020   Drug side effects, initial encounter 07/20/2020   Coronary atherosclerosis 03/18/2019   Post-traumatic headache, not intractable 12/04/2018   Grief 09/12/2018   Atrophic  vaginitis 09/06/2017   Burning sensation of mouth 09/06/2017   Osteopenia 09/05/2016   Upper respiratory infection 09/05/2016   Chest pain, atypical 03/04/2016   Headache 07/19/2013   Tinnitus 07/19/2013   Dry skin dermatitis 08/31/2012   Left hip pain 08/31/2012   Cerumen impaction 08/31/2012   Well adult exam 05/25/2012   Upper abdominal pain-chronic 04/02/2011   Thumb pain 03/11/2011   Shoulder pain 03/11/2011   Bilateral primary osteoarthritis of knee 09/10/2010   VERTIGO 03/05/2010   OSA on CPAP 07/22/2008   B12 deficiency 08/16/2007   Vitamin D deficiency 08/16/2007   Nonorganic sleep disorder 05/10/2007   SARCOIDOSIS, PULMONARY 05/09/2007   Hypothyroidism 05/09/2007   Allergic rhinitis 05/09/2007   GERD 05/09/2007   History of colonic polyps 05/09/2007    REFERRING PROVIDER: Malon Kindle MD  REFERRING DIAG: S/p right RTC repair.  THERAPY DIAG:  Chronic right shoulder pain  Stiffness of right shoulder, not elsewhere classified  Localized edema  Rationale for Evaluation and Treatment: Rehabilitation  ONSET DATE: 08/28/23 (surgery date).  SUBJECTIVE:            Back to MD 11-16-23 .                                                                                      SUBJECTIVE STATEMENT: The RT shldr is sore after picking up my cat                                                                                     PERTINENT HISTORY: See above.  PAIN:  Are you having pain? Yes: NPRS scale: 4/10. Pain location: Right shoulder. Pain description: Sharp. Aggravating factors: Movement. Relieving factors: Medication.  PRECAUTIONS: Other: Progress per protocol.      WEIGHT BEARING RESTRICTIONS:  No right UE weight bearing.    FALLS:  Has patient fallen in last 6 months? No  LIVING ENVIRONMENT: Lives in: House/apartment Has following equipment at home: None  OCCUPATION: "Semi-Retired."  PLOF: Independent  PATIENT GOALS:Use right UE without pain.  NEXT MD VISIT:   OBJECTIVE:  Note: Objective measures were completed at Evaluation unless otherwise noted.  UPPER EXTREMITY ROM:   In supine:  Gentle right shoulder PROM to 75 degrees of flexion and ER to 10 degrees.  PALPATION:  Right shoulder incision cover with sterile gauze (patient's performed for her).  She c/o diffuse shoulder pain currently and her shoulder is visibly swollen.  TREATMENT DATE: 10/16/23    7weeks   10-16-23  RT shldr Pulleys x 5 mins Seated ranger x 5 mins CW/CCW and front/back Wall ladder x 5  max  number 21 Manual:   PROM/ PAROM for flexion, ABD, as well as ER as well as rhythmic stab. for with Pt in hook lying on mat  and feet elevated  Vasopneumatic on low x 15 minutes with pillow between right elbow ans thorax.     PATIENT EDUCATION: Education details: See below (this exercise was part of her hospital discharge packet). Person educated: Patient Education method: Explanation, Demonstration, Tactile cues, Verbal cues, and Handouts Education comprehension: verbalized understanding and returned demonstration  HOME EXERCISE PROGRAM: HOME EXERCISE PROGRAM Created by [place] Applegate Jan 30th, 2025 View at [email or web address] using code: [email or web address]  Page 1 of 1 1 Exercise WAND EXTERNAL ROTATION - SUPINE ER Lie on your back holding a cane or wand with both hands.  On the affected side, place a small rolled up towel or pillow under your elbow. Maintain approx. 90 degree bend at the elbow with your arm approximately 30-45 degrees away from your side. GENTLE. PAIN-FREE Use your other arm to pull the wand/cane to rotate the affected arm back into a stretch. Hold and then return to starting position and then repeat. Repeat 10 Times Hold 15 Seconds Complete 1 Set Perform 4 Times a Day  ASSESSMENT:  CLINICAL IMPRESSION: Pt arrived today doing fairly well with RT shldr but with increased pain due to having to pick up her cat. She was able to continue with AAROM exs as  well as PROM/ AAROM all motions and did well with decreased pain end of session.   OBJECTIVE IMPAIRMENTS: decreased activity tolerance, decreased ROM, increased edema, and pain.   ACTIVITY LIMITATIONS: carrying, lifting, and reach over head  PARTICIPATION LIMITATIONS: meal prep, cleaning, and laundry  REHAB POTENTIAL: Excellent  CLINICAL DECISION MAKING: Stable/uncomplicated  EVALUATION COMPLEXITY: Low   GOALS:   Target date:  09/14/23.             PT LONG TERM GOAL #1    Title independent with a HEP.     Time 4     Period Weeks     Status Partilly met         PT LONG TERM GOAL #2    Title Active right  shoulder flexion  to 145 degrees so the patient can easily reach overhead.     Time 4     Period Weeks     Status On going         PT LONG TERM GOAL #3    Title Active ER to 70 degrees+ to allow for easily donning/doffing of apparel.     Time 4     Period Weeks     Status On going         PT LONG TERM GOAL #4    Title Increase ROM so patient is able to reach behind back to L3.     Time 4     Period Weeks     Status On going         PT LONG TERM GOAL #5    Title Increase right shoulder strength to a solid 4+/5 to increase stability for performance of functional activities.     Time 4     Period Weeks     Status On going         PT LONG TERM GOAL #6  Title Perform ADL's with pain not > 2-3/10.     Time 6     Period Weeks     Status On going      PLAN:  PT FREQUENCY: 2x/week  PT DURATION: 6 months  PLANNED INTERVENTIONS: 97110-Therapeutic exercises, 97530- Therapeutic activity, 97112- Neuromuscular re-education, 97535- Self Care, 53664- Manual therapy, 97014- Electrical stimulation (unattended), 97016- Vasopneumatic device, Cryotherapy, and Moist heat  PLAN FOR NEXT SESSION: N.O.  AAROM and light strengthening next to body with yellow tband. vasopneumatic on low with pillow between thorax and elbow.    Maven Varelas,CHRIS, PTA 10/16/2023, 1:19 PM

## 2023-10-20 ENCOUNTER — Ambulatory Visit: Admitting: *Deleted

## 2023-10-20 DIAGNOSIS — M25611 Stiffness of right shoulder, not elsewhere classified: Secondary | ICD-10-CM

## 2023-10-20 DIAGNOSIS — R600 Localized edema: Secondary | ICD-10-CM | POA: Diagnosis not present

## 2023-10-20 DIAGNOSIS — G8929 Other chronic pain: Secondary | ICD-10-CM

## 2023-10-20 DIAGNOSIS — M25511 Pain in right shoulder: Secondary | ICD-10-CM | POA: Diagnosis not present

## 2023-10-20 NOTE — Therapy (Signed)
 OUTPATIENT PHYSICAL THERAPY SHOULDER TREATMENT   Patient Name: JENAYAH ANTU MRN: [id number] DOB:07/06/1941, 83 y.o., female Today's Date: 10/20/2023  END OF SESSION:  PT End of Session - 10/20/23 0808     Visit Number 14    Number of Visits 20    Date for PT Re-Evaluation 11/10/23    Authorization Type FOTO AT LEAST EVERY 5TH VISIT.  PROGRESS NOTE AT 10TH VISIT.  KX MODIFIER AFTER 15 VISITS.    PT Start Time 0800    PT Stop Time 0845    PT Time Calculation (min) 45 min              Past Medical History:  Diagnosis Date   Allergy    Bursitis    Cancer (HCC) 03/22/2017   Melanoma in situ, lentigo maligna type   Deviated septum    External hemorrhoids without mention of complication 2008   Colonoscopy    Fatigue    GERD (gastroesophageal reflux disease)    Headache(784.0)    migraines   Heat rash    under the breasts.Marland Kitchenappeared on monday.Marland KitchenMarland KitchenBurning & itching, uses cortisone   Hypothyroidism    Iron deficiency anemia, unspecified    Lower esophageal ring 2008   EGD   Nonorganic sleep disorder, unspecified    Osteoarthritis    Osteoporosis    Pancreatitis    Personal history of colonic polyps    Polio 1948   Pulmonary sarcoidosis (HCC)    Sleep apnea    [facility]   Vitamin B12 deficiency    Vitamin D deficiency    Past Surgical History:  Procedure Laterality Date   APPENDECTOMY  1988   CARPAL TUNNEL RELEASE  2002   rt   CHOLECYSTECTOMY  2000   COLONOSCOPY  12/19/2006   Multiple diminutive polyps destroyed-removed (hyperpastic), internal hemorrhoids   ESOPHAGOGASTRODUODENOSCOPY  12/19/2006   lower esophageal ring dilated   HAND SURGERY  1997   left   KNEE ARTHROSCOPY Bilateral    multiple 2 on lft 1 on rt   LUMBAR DISC SURGERY  1995   Melanoma Removal  Right 2018   right face   ROTATOR CUFF REPAIR Left    Nov 29, 2021   TOE SURGERY Left    left foot next to little toe  joint rem   TOTAL KNEE ARTHROPLASTY Right  05/09/2014   Procedure: RIGHT TOTAL KNEE ARTHROPLASTY;  Surgeon: Verlee Rossetti, MD;  Location: [facility] OR;  Service: Orthopedics;  Laterality: Right;   TOTAL KNEE ARTHROPLASTY Left 08/06/2021   Procedure: TOTAL KNEE ARTHROPLASTY;  Surgeon: Beverely Low, MD;  Location: [facility];  Service: Orthopedics;  Laterality: Left;   UPPER GASTROINTESTINAL ENDOSCOPY     WISDOM TOOTH EXTRACTION     Patient Active Problem List   Diagnosis Date Noted   Preop exam for internal medicine 06/19/2023   Aortic atherosclerosis (HCC) 10/19/2022   Elevated coronary artery calcium score 10/19/2022   Edema 08/29/2022   Palpitations 01/24/2022   Abdominal hernia 11/24/2021   Anemia 11/24/2021   Asthma 11/24/2021   Decreased estrogen level 11/24/2021   Syncope 11/24/2021   Sphenoid sinusitis 11/24/2021   Rotator cuff tear 11/22/2021   Full thickness rotator cuff tear 09/30/2021   Insomnia 09/23/2020   Dyslipidemia 09/23/2020   RLS (restless legs syndrome) 07/20/2020   Drug side effects, initial encounter 07/20/2020   Coronary atherosclerosis 03/18/2019   Post-traumatic headache, not intractable 12/04/2018   Grief 09/12/2018   Atrophic  vaginitis 09/06/2017   Burning sensation of mouth 09/06/2017   Osteopenia 09/05/2016   Upper respiratory infection 09/05/2016   Chest pain, atypical 03/04/2016   Headache 07/19/2013   Tinnitus 07/19/2013   Dry skin dermatitis 08/31/2012   Left hip pain 08/31/2012   Cerumen impaction 08/31/2012   Well adult exam 05/25/2012   Upper abdominal pain-chronic 04/02/2011   Thumb pain 03/11/2011   Shoulder pain 03/11/2011   Bilateral primary osteoarthritis of knee 09/10/2010   VERTIGO 03/05/2010   OSA on CPAP 07/22/2008   B12 deficiency 08/16/2007   Vitamin D deficiency 08/16/2007   Nonorganic sleep disorder 05/10/2007   SARCOIDOSIS, PULMONARY 05/09/2007   Hypothyroidism 05/09/2007   Allergic rhinitis 05/09/2007   GERD 05/09/2007   History of colonic polyps 05/09/2007    REFERRING PROVIDER: Malon Kindle MD  REFERRING DIAG: S/p right RTC repair.  THERAPY DIAG:  Chronic right shoulder pain  Stiffness of right shoulder, not elsewhere classified  Localized edema  Rationale for Evaluation and Treatment: Rehabilitation  ONSET DATE: 08/28/23 (surgery date).  SUBJECTIVE:            Back to MD 11-16-23 .                                                                                      SUBJECTIVE STATEMENT: The RT shldr is sore after picking up my cat. 3-5/10 still sore when trying to raise my arm also . No Vaso today. Need to leave by 8:40 AM                                                                                    PERTINENT HISTORY: See above.  PAIN:  Are you having pain? Yes: NPRS scale: 3-5/10. Pain location: Right shoulder. Pain description: Sharp. Aggravating factors: Movement. Relieving factors: Medication.  PRECAUTIONS: Other: Progress per protocol.      WEIGHT BEARING RESTRICTIONS:  No right UE weight bearing.    FALLS:  Has patient fallen in last 6 months? No  LIVING ENVIRONMENT: Lives in: House/apartment Has following equipment at home: None  OCCUPATION: "Semi-Retired."  PLOF: Independent  PATIENT GOALS:Use right UE without pain.  NEXT MD VISIT:   OBJECTIVE:  Note: Objective measures were completed at Evaluation unless otherwise noted.  UPPER EXTREMITY ROM:   In supine:  Gentle right shoulder PROM to 75 degrees of flexion and ER to 10 degrees.  PALPATION:  Right shoulder incision cover with sterile gauze (patient's performed for her).  She c/o diffuse shoulder pain currently and her shoulder is visibly swollen.  TREATMENT DATE: 10/20/23    7weeks   10-16-23  RT shldr Pulleys x 6 mins Seated ranger x 6 mins CW/CCW and front/back Wall ladder x 5  max  number 24 Standing yellow  tband Rows, IR, ER   2x10 Manual:   PROM/ PAROM for flexion, ABD, as well as ER as well as rhythmic stab. for with Pt in hook lying on mat and feet elevated   standing AAROM flexion to 150 degrees  Vasopneumatic      PATIENT EDUCATION: Education details: See below (this exercise was part of her hospital discharge packet). Person educated: Patient Education method: Explanation, Demonstration, Tactile cues, Verbal cues, and Handouts Education comprehension: verbalized understanding and returned demonstration  HOME EXERCISE PROGRAM: HOME EXERCISE PROGRAM Created by [place] Applegate Jan 30th, 2025 View at [email or web address] using code: [email or web address]  Page 1 of 1 1 Exercise WAND EXTERNAL ROTATION - SUPINE ER Lie on your back holding a cane or wand with both hands.  On the affected side, place a small rolled up towel or pillow under your elbow. Maintain approx. 90 degree bend at the elbow with your arm approximately 30-45 degrees away from your side. GENTLE. PAIN-FREE Use your other arm to pull the wand/cane to rotate the affected arm back into a stretch. Hold and then return to starting position and then repeat. Repeat 10 Times Hold 15 Seconds Complete 1 Set Perform 4 Times a Day  ASSESSMENT:  CLINICAL IMPRESSION: Pt arrived today doing fairly well with RT shldr but still sore when raising my arm.  She was able to continue with AAROM exs as  well as PROM/ AAROM all motions and did well with decreased pain end of session.   OBJECTIVE IMPAIRMENTS: decreased activity tolerance, decreased ROM, increased edema, and pain.   ACTIVITY LIMITATIONS: carrying, lifting, and reach over head  PARTICIPATION LIMITATIONS: meal prep, cleaning, and laundry  REHAB POTENTIAL: Excellent  CLINICAL DECISION MAKING: Stable/uncomplicated  EVALUATION COMPLEXITY: Low   GOALS:   Target date:  09/14/23.             PT LONG TERM GOAL #1    Title independent with a HEP.     Time 4     Period Weeks      Status Partilly met         PT LONG TERM GOAL #2    Title Active right  shoulder flexion to 145 degrees so the patient can easily reach overhead.     Time 4     Period Weeks     Status On going         PT LONG TERM GOAL #3    Title Active ER to 70 degrees+ to allow for easily donning/doffing of apparel.     Time 4     Period Weeks     Status On going         PT LONG TERM GOAL #4    Title Increase ROM so patient is able to reach behind back to L3.     Time 4     Period Weeks     Status On going         PT LONG TERM GOAL #5    Title Increase right shoulder strength to a solid 4+/5 to increase stability for performance of functional activities.     Time 4     Period Weeks     Status On going         PT LONG TERM GOAL #  6    Title Perform ADL's with pain not > 2-3/10.     Time 6     Period Weeks     Status On going      PLAN:  PT FREQUENCY: 2x/week  PT DURATION: 6 months  PLANNED INTERVENTIONS: 97110-Therapeutic exercises, 97530- Therapeutic activity, 97112- Neuromuscular re-education, 97535- Self Care, 59563- Manual therapy, 97014- Electrical stimulation (unattended), 97016- Vasopneumatic device, Cryotherapy, and Moist heat  PLAN FOR NEXT SESSION: N.O.  AAROM and light strengthening next to body with yellow tband. vasopneumatic on low with pillow between thorax and elbow.    Jyair Kiraly,CHRIS, PTA 10/20/2023, 8:54 AM

## 2023-10-22 NOTE — Progress Notes (Deleted)
  Cardiology Office Note:   Date:  10/22/2023  ID:  [name], [name] 1941-04-24, MRN [id number] PCP: Tresa Garter, MD  [facility] Providers Cardiologist:  Rollene Rotunda, MD {  History of Present Illness:   Elizabeth Gray is a 83 y.o. female who presents for evaluation of palpitations.  She was referred by Plotnikov, Georgina Quint, MD. she had a work-up for syncope.  This happened in April 2023.  Since that time she has had no further syncope.  She did have coronary calcium with a score of 289 on CT in 2020.   She had a low risk perfusion study in 2017.   She had a negative POET (Plain Old Exercise Treadmill) in 2024. Echo in in 2024 February demonstrated an EF of 70 to 75%.  There were no significant valvular abnormalities.     ***  ***Her palpitations been well-controlled on low-dose beta-blocker.  She did have 1 episode of chest discomfort sometime ago.  This was a substernal discomfort.  It came and went spontaneously.  She has not had this in several weeks.  She has 4 part-time jobs.  She stays physically active with this and does not bring on any symptoms.  Is not having any new shortness of breath, PND or orthopnea.  She is not having any palpitations, presyncope or syncope.  She has had some weight gain because she has been eating too much.  She has had some very mild ankle edema.  ROS: ***  Studies Reviewed:    EKG:       ***  Risk Assessment/Calculations:   {Does this patient have ATRIAL FIBRILLATION?:[id number]} No BP recorded.  {Refresh Note OR Click here to enter BP  :1}***        Physical Exam:   VS:  There were no vitals taken for this visit.   Wt Readings from Last 3 Encounters:  06/19/23 177 lb (80.3 kg)  03/06/23 162 lb (73.5 kg)  03/03/23 153 lb (69.4 kg)     GEN: Well nourished, well developed in no acute distress NECK: No JVD; No carotid bruits CARDIAC: ***RR, *** murmurs, rubs, gallops RESPIRATORY:  Clear to auscultation without rales,  wheezing or rhonchi  ABDOMEN: Soft, non-tender, non-distended EXTREMITIES:  No edema; No deformity   ASSESSMENT AND PLAN:       CHEST PAIN:  ***   patient has chest discomfort as described.  I think the pretest probability of obstructive coronary disease is low but she does have coronary calcium.  I am going to bring her back for a POET (Plain Old Exercise Treadmill).  She does have a right bundle branch block but her EKG should be interpretable.   PALPITATIONS:   She had SVT.  ***  She is doing well with current low-dose of beta-blocker.  No change in therapy.    SYNCOPE:    ***  She has had no further syncope.  No further evaluation.   ELEVATED CORONARY CALCIUM:   This was 69th percentile for her age.  I will evaluate with a POET (Plain Old Exercise Treadmill) as above.   AORTIC ATHEROSCLEROSIS:    We are participating with aggressive risk reduction.  No change in therapy. Follow up ***  Signed, Rollene Rotunda, MD

## 2023-10-23 ENCOUNTER — Ambulatory Visit: Payer: Medicare Other | Admitting: Cardiology

## 2023-10-23 DIAGNOSIS — R002 Palpitations: Secondary | ICD-10-CM

## 2023-10-23 DIAGNOSIS — R931 Abnormal findings on diagnostic imaging of heart and coronary circulation: Secondary | ICD-10-CM

## 2023-10-24 ENCOUNTER — Encounter: Payer: Self-pay | Admitting: *Deleted

## 2023-10-24 ENCOUNTER — Ambulatory Visit: Admitting: *Deleted

## 2023-10-24 DIAGNOSIS — M25611 Stiffness of right shoulder, not elsewhere classified: Secondary | ICD-10-CM

## 2023-10-24 DIAGNOSIS — G8929 Other chronic pain: Secondary | ICD-10-CM | POA: Diagnosis not present

## 2023-10-24 DIAGNOSIS — M25511 Pain in right shoulder: Secondary | ICD-10-CM | POA: Diagnosis not present

## 2023-10-24 DIAGNOSIS — R600 Localized edema: Secondary | ICD-10-CM

## 2023-10-24 NOTE — Therapy (Signed)
 OUTPATIENT PHYSICAL THERAPY SHOULDER TREATMENT   Patient Name: Elizabeth Gray MRN: [id number] DOB:April 15, 1941, 83 y.o., female Today's Date: 10/24/2023  END OF SESSION:  PT End of Session - 10/24/23 1603     Visit Number 15    Number of Visits 20    Date for PT Re-Evaluation 11/10/23    Authorization Type FOTO AT LEAST EVERY 5TH VISIT.  PROGRESS NOTE AT 10TH VISIT.  KX MODIFIER AFTER 15 VISITS.    PT Start Time 1603    PT Stop Time 1659    PT Time Calculation (min) 56 min              Past Medical History:  Diagnosis Date   Allergy    Bursitis    Cancer (HCC) 03/22/2017   Melanoma in situ, lentigo maligna type   Deviated septum    External hemorrhoids without mention of complication 2008   Colonoscopy    Fatigue    GERD (gastroesophageal reflux disease)    Headache(784.0)    migraines   Heat rash    under the breasts.Marland Kitchenappeared on monday.Marland KitchenMarland KitchenBurning & itching, uses cortisone   Hypothyroidism    Iron deficiency anemia, unspecified    Lower esophageal ring 2008   EGD   Nonorganic sleep disorder, unspecified    Osteoarthritis    Osteoporosis    Pancreatitis    Personal history of colonic polyps    Polio 1948   Pulmonary sarcoidosis (HCC)    Sleep apnea    [facility]   Vitamin B12 deficiency    Vitamin D deficiency    Past Surgical History:  Procedure Laterality Date   APPENDECTOMY  1988   CARPAL TUNNEL RELEASE  2002   rt   CHOLECYSTECTOMY  2000   COLONOSCOPY  12/19/2006   Multiple diminutive polyps destroyed-removed (hyperpastic), internal hemorrhoids   ESOPHAGOGASTRODUODENOSCOPY  12/19/2006   lower esophageal ring dilated   HAND SURGERY  1997   left   KNEE ARTHROSCOPY Bilateral    multiple 2 on lft 1 on rt   LUMBAR DISC SURGERY  1995   Melanoma Removal  Right 2018   right face   ROTATOR CUFF REPAIR Left    Nov 29, 2021   TOE SURGERY Left    left foot next to little toe  joint rem   TOTAL KNEE ARTHROPLASTY Right  05/09/2014   Procedure: RIGHT TOTAL KNEE ARTHROPLASTY;  Surgeon: Verlee Rossetti, MD;  Location: [facility];  Service: Orthopedics;  Laterality: Right;   TOTAL KNEE ARTHROPLASTY Left 08/06/2021   Procedure: TOTAL KNEE ARTHROPLASTY;  Surgeon: Beverely Low, MD;  Location: [facility] OR[facility];  Service: Orthopedics;  Laterality: Left;   UPPER GASTROINTESTINAL ENDOSCOPY     WISDOM TOOTH EXTRACTION     Patient Active Problem List   Diagnosis Date Noted   Preop exam for internal medicine 06/19/2023   Aortic atherosclerosis (HCC) 10/19/2022   Elevated coronary artery calcium score 10/19/2022   Edema 08/29/2022   Palpitations 01/24/2022   Abdominal hernia 11/24/2021   Anemia 11/24/2021   Asthma 11/24/2021   Decreased estrogen level 11/24/2021   Syncope 11/24/2021   Sphenoid sinusitis 11/24/2021   Rotator cuff tear 11/22/2021   Full thickness rotator cuff tear 09/30/2021   Insomnia 09/23/2020   Dyslipidemia 09/23/2020   RLS (restless legs syndrome) 07/20/2020   Drug side effects, initial encounter 07/20/2020   Coronary atherosclerosis 03/18/2019   Post-traumatic headache, not intractable 12/04/2018   Grief 09/12/2018   Atrophic  vaginitis 09/06/2017   Burning sensation of mouth 09/06/2017   Osteopenia 09/05/2016   Upper respiratory infection 09/05/2016   Chest pain, atypical 03/04/2016   Headache 07/19/2013   Tinnitus 07/19/2013   Dry skin dermatitis 08/31/2012   Left hip pain 08/31/2012   Cerumen impaction 08/31/2012   Well adult exam 05/25/2012   Upper abdominal pain-chronic 04/02/2011   Thumb pain 03/11/2011   Shoulder pain 03/11/2011   Bilateral primary osteoarthritis of knee 09/10/2010   VERTIGO 03/05/2010   OSA on CPAP 07/22/2008   B12 deficiency 08/16/2007   Vitamin D deficiency 08/16/2007   Nonorganic sleep disorder 05/10/2007   SARCOIDOSIS, PULMONARY 05/09/2007   Hypothyroidism 05/09/2007   Allergic rhinitis 05/09/2007   GERD 05/09/2007   History of colonic polyps 05/09/2007    REFERRING PROVIDER: Malon Kindle MD  REFERRING DIAG: S/p right RTC repair.  THERAPY DIAG:  Chronic right shoulder pain  Stiffness of right shoulder, not elsewhere classified  Localized edema  Rationale for Evaluation and Treatment: Rehabilitation  ONSET DATE: 08/28/23 (surgery date).  SUBJECTIVE:            Back to MD 11-16-23 .                                                                                      SUBJECTIVE STATEMENT: RT shldr 3-5/10 still sore when trying to raise my arm                                                                                     PERTINENT HISTORY: See above.  PAIN:  Are you having pain? Yes: NPRS scale: 3-5/10. Pain location: Right shoulder. Pain description: Sharp. Aggravating factors: Movement. Relieving factors: Medication.  PRECAUTIONS: Other: Progress per protocol.      WEIGHT BEARING RESTRICTIONS:  No right UE weight bearing.    FALLS:  Has patient fallen in last 6 months? No  LIVING ENVIRONMENT: Lives in: House/apartment Has following equipment at home: None  OCCUPATION: "Semi-Retired."  PLOF: Independent  PATIENT GOALS:Use right UE without pain.  NEXT MD VISIT:   OBJECTIVE:  Note: Objective measures were completed at Evaluation unless otherwise noted.  UPPER EXTREMITY ROM:   In supine:  Gentle right shoulder PROM to 75 degrees of flexion and ER to 10 degrees.  PALPATION:  Right shoulder incision cover with sterile gauze (patient's performed for her).  She c/o diffuse shoulder pain currently and her shoulder is visibly swollen.  TREATMENT DATE: 10/24/23    7weeks   10-16-23  RT shldr Pulleys x 6 mins Standing ranger x 4 mins CW/CCW and flexion Wall ladder x 5  max  number 24 Standing yellow tband Rows, IR, ER and ext  2x10  Supine flexion and SL flexion x 10 Manual:   PROM/  PAROM for flexion, ABD, as well as ER as well as rhythmic stab. for with Pt in hook lying on mat and feet elevated    Vasopneumatic  x 15 mins RT shldr    PATIENT EDUCATION: Education details: See below (this exercise was part of her hospital discharge packet). Person educated: Patient Education method: Explanation, Demonstration, Tactile cues, Verbal cues, and Handouts Education comprehension: verbalized understanding and returned demonstration  HOME EXERCISE PROGRAM: HOME EXERCISE PROGRAM Created by [place] Applegate Jan 30th, 2025 View at [email or web address] using code: [email or web address]  Page 1 of 1 1 Exercise WAND EXTERNAL ROTATION - SUPINE ER Lie on your back holding a cane or wand with both hands.  On the affected side, place a small rolled up towel or pillow under your elbow. Maintain approx. 90 degree bend at the elbow with your arm approximately 30-45 degrees away from your side. GENTLE. PAIN-FREE Use your other arm to pull the wand/cane to rotate the affected arm back into a stretch. Hold and then return to starting position and then repeat. Repeat 10 Times Hold 15 Seconds Complete 1 Set Perform 4 Times a Day  ASSESSMENT:  CLINICAL IMPRESSION: Pt arrived today doing fairly well with RT shldr but still sore when raising her arm.  Pt was able to continiue with therex for RT shldr with AROM progressions in supine and side lying and did well.   OBJECTIVE IMPAIRMENTS: decreased activity tolerance, decreased ROM, increased edema, and pain.   ACTIVITY LIMITATIONS: carrying, lifting, and reach over head  PARTICIPATION LIMITATIONS: meal prep, cleaning, and laundry  REHAB POTENTIAL: Excellent  CLINICAL DECISION MAKING: Stable/uncomplicated  EVALUATION COMPLEXITY: Low   GOALS:   Target date:  09/14/23.             PT LONG TERM GOAL #1    Title independent with a HEP.     Time 4     Period Weeks     Status Partilly met         PT LONG TERM GOAL #2    Title Active right   shoulder flexion to 145 degrees so the patient can easily reach overhead.     Time 4     Period Weeks     Status On going         PT LONG TERM GOAL #3    Title Active ER to 70 degrees+ to allow for easily donning/doffing of apparel.     Time 4     Period Weeks     Status On going         PT LONG TERM GOAL #4    Title Increase ROM so patient is able to reach behind back to L3.     Time 4     Period Weeks     Status On going         PT LONG TERM GOAL #5    Title Increase right shoulder strength to a solid 4+/5 to increase stability for performance of functional activities.     Time 4     Period Weeks     Status On going         PT  LONG TERM GOAL #6    Title Perform ADL's with pain not > 2-3/10.     Time 6     Period Weeks     Status On going      PLAN:  PT FREQUENCY: 2x/week  PT DURATION: 6 months  PLANNED INTERVENTIONS: 97110-Therapeutic exercises, 97530- Therapeutic activity, 97112- Neuromuscular re-education, 97535- Self Care, 02725- Manual therapy, 97014- Electrical stimulation (unattended), 97016- Vasopneumatic device, Cryotherapy, and Moist heat  PLAN FOR NEXT SESSION: N.O.  AAROM and light strengthening next to body with yellow tband. vasopneumatic on low with pillow between thorax and elbow.    Wayden Schwertner,CHRIS, PTA 10/24/2023, 6:14 PM

## 2023-10-27 ENCOUNTER — Encounter: Payer: Self-pay | Admitting: *Deleted

## 2023-10-27 ENCOUNTER — Ambulatory Visit: Admitting: *Deleted

## 2023-10-27 DIAGNOSIS — G8929 Other chronic pain: Secondary | ICD-10-CM | POA: Diagnosis not present

## 2023-10-27 DIAGNOSIS — R600 Localized edema: Secondary | ICD-10-CM

## 2023-10-27 DIAGNOSIS — M25611 Stiffness of right shoulder, not elsewhere classified: Secondary | ICD-10-CM

## 2023-10-27 DIAGNOSIS — M25511 Pain in right shoulder: Secondary | ICD-10-CM | POA: Diagnosis not present

## 2023-10-27 NOTE — Therapy (Signed)
 OUTPATIENT PHYSICAL THERAPY SHOULDER TREATMENT   Patient Name: Elizabeth Gray MRN: [id number] DOB:Feb 20, 1941, 83 y.o., female Today's Date: 10/27/2023  END OF SESSION:  PT End of Session - 10/27/23 0933     Visit Number 16    Number of Visits 20    Date for PT Re-Evaluation 11/10/23    Authorization Type FOTO AT LEAST EVERY 5TH VISIT.  PROGRESS NOTE AT 10TH VISIT.  KX MODIFIER AFTER 15 VISITS.    PT Start Time [phone]              Past Medical History:  Diagnosis Date   Allergy    Bursitis    Cancer (HCC) 03/22/2017   Melanoma in situ, lentigo maligna type   Deviated septum    External hemorrhoids without mention of complication 2008   Colonoscopy    Fatigue    GERD (gastroesophageal reflux disease)    Headache(784.0)    migraines   Heat rash    under the breasts.Marland Kitchenappeared on monday.Marland KitchenMarland KitchenBurning & itching, uses cortisone   Hypothyroidism    Iron deficiency anemia, unspecified    Lower esophageal ring 2008   EGD   Nonorganic sleep disorder, unspecified    Osteoarthritis    Osteoporosis    Pancreatitis    Personal history of colonic polyps    Polio 1948   Pulmonary sarcoidosis (HCC)    Sleep apnea    [facility]ap [facility]   Vitamin B12 deficiency    Vitamin D deficiency    Past Surgical History:  Procedure Laterality Date   APPENDECTOMY  1988   CARPAL TUNNEL RELEASE  2002   rt   CHOLECYSTECTOMY  2000   COLONOSCOPY  12/19/2006   Multiple diminutive polyps destroyed-removed (hyperpastic), internal hemorrhoids   ESOPHAGOGASTRODUODENOSCOPY  12/19/2006   lower esophageal ring dilated   HAND SURGERY  1997   left   KNEE ARTHROSCOPY Bilateral    multiple 2 on lft 1 on rt   LUMBAR DISC SURGERY  1995   Melanoma Removal  Right 2018   right face   ROTATOR CUFF REPAIR Left    Nov 29, 2021   TOE SURGERY Left    left foot next to little toe  joint rem   TOTAL KNEE ARTHROPLASTY Right 05/09/2014   Procedure: RIGHT TOTAL KNEE ARTHROPLASTY;   Surgeon: Verlee Rossetti, MD;  Location: [facility] OR;  Service: Orthopedics;  Laterality: Right;   TOTAL KNEE ARTHROPLASTY Left 08/06/2021   Procedure: TOTAL KNEE ARTHROPLASTY;  Surgeon: Beverely Low, MD;  Location: [facility];  Service: Orthopedics;  Laterality: Left;   UPPER GASTROINTESTINAL ENDOSCOPY     WISDOM TOOTH EXTRACTION     Patient Active Problem List   Diagnosis Date Noted   Preop exam for internal medicine 06/19/2023   Aortic atherosclerosis (HCC) 10/19/2022   Elevated coronary artery calcium score 10/19/2022   Edema 08/29/2022   Palpitations 01/24/2022   Abdominal hernia 11/24/2021   Anemia 11/24/2021   Asthma 11/24/2021   Decreased estrogen level 11/24/2021   Syncope 11/24/2021   Sphenoid sinusitis 11/24/2021   Rotator cuff tear 11/22/2021   Full thickness rotator cuff tear 09/30/2021   Insomnia 09/23/2020   Dyslipidemia 09/23/2020   RLS (restless legs syndrome) 07/20/2020   Drug side effects, initial encounter 07/20/2020   Coronary atherosclerosis 03/18/2019   Post-traumatic headache, not intractable 12/04/2018   Grief 09/12/2018   Atrophic vaginitis 09/06/2017   Burning sensation of mouth 09/06/2017   Osteopenia 09/05/2016   Upper  respiratory infection 09/05/2016   Chest pain, atypical 03/04/2016   Headache 07/19/2013   Tinnitus 07/19/2013   Dry skin dermatitis 08/31/2012   Left hip pain 08/31/2012   Cerumen impaction 08/31/2012   Well adult exam 05/25/2012   Upper abdominal pain-chronic 04/02/2011   Thumb pain 03/11/2011   Shoulder pain 03/11/2011   Bilateral primary osteoarthritis of knee 09/10/2010   VERTIGO 03/05/2010   OSA on CPAP 07/22/2008   B12 deficiency 08/16/2007   Vitamin D deficiency 08/16/2007   Nonorganic sleep disorder 05/10/2007   SARCOIDOSIS, PULMONARY 05/09/2007   Hypothyroidism 05/09/2007   Allergic rhinitis 05/09/2007   GERD 05/09/2007   History of colonic polyps 05/09/2007   REFERRING PROVIDER: Malon Kindle MD  REFERRING DIAG: S/p  right RTC repair.  THERAPY DIAG:  Chronic right shoulder pain  Stiffness of right shoulder, not elsewhere classified  Localized edema  Rationale for Evaluation and Treatment: Rehabilitation  ONSET DATE: 08/28/23 (surgery date).  SUBJECTIVE:            Back to MD 11-16-23 .                                                                                      SUBJECTIVE STATEMENT: RT shldr 3/10 still sore , but doing better                                                                                    PERTINENT HISTORY: See above.  PAIN:  Are you having pain? Yes: NPRS scale: 3/10. Pain location: Right shoulder. Pain description: Sharp. Aggravating factors: Movement. Relieving factors: Medication.  PRECAUTIONS: Other: Progress per protocol.      WEIGHT BEARING RESTRICTIONS:  No right UE weight bearing.    FALLS:  Has patient fallen in last 6 months? No  LIVING ENVIRONMENT: Lives in: House/apartment Has following equipment at home: None  OCCUPATION: "Semi-Retired."  PLOF: Independent  PATIENT GOALS:Use right UE without pain.  NEXT MD VISIT:   OBJECTIVE:  Note: Objective measures were completed at Evaluation unless otherwise noted.  UPPER EXTREMITY ROM:   In supine:  Gentle right shoulder PROM to 75 degrees of flexion and ER to 10 degrees.  PALPATION:  Right shoulder incision cover with sterile gauze (patient's performed for her).  She c/o diffuse shoulder pain currently and her shoulder is visibly swollen.  TREATMENT DATE: 10/27/23    7weeks   10-16-23  RT shldr Pulleys x 6 mins Standing ranger x 4 mins CW/CCW and flexion Wall ladder x 5  max  number 25-26 Standing yellow tband Rows, IR, ER and ext  2x10  Supine flexion and SL flexion x 10 Manual:   PROM/ PAROM for flexion, ABD, as well as ER as well as rhythmic stab. for with Pt  in hook lying on mat and feet elevated    Vasopneumatic  x 15 mins RT shldr    PATIENT EDUCATION: Education details: See below (this exercise was part of her hospital discharge packet). Person educated: Patient Education method: Explanation, Demonstration, Tactile cues, Verbal cues, and Handouts Education comprehension: verbalized understanding and returned demonstration  HOME EXERCISE PROGRAM: HOME EXERCISE PROGRAM Created by [place] Applegate Jan 30th, 2025 View at [email or web address] using code: [email or web address]  Page 1 of 1 1 Exercise WAND EXTERNAL ROTATION - SUPINE ER Lie on your back holding a cane or wand with both hands.  On the affected side, place a small rolled up towel or pillow under your elbow. Maintain approx. 90 degree bend at the elbow with your arm approximately 30-45 degrees away from your side. GENTLE. PAIN-FREE Use your other arm to pull the wand/cane to rotate the affected arm back into a stretch. Hold and then return to starting position and then repeat. Repeat 10 Times Hold 15 Seconds Complete 1 Set Perform 4 Times a Day  ASSESSMENT:  CLINICAL IMPRESSION: Pt arrived today doing fairly well with RT shldr and reports decreased soreness now. Pt was able to continiue with AAROM, AROM as well as light strengthening exs. Progression with AROM flexion to 120 degrees in standing today.   OBJECTIVE IMPAIRMENTS: decreased activity tolerance, decreased ROM, increased edema, and pain.   ACTIVITY LIMITATIONS: carrying, lifting, and reach over head  PARTICIPATION LIMITATIONS: meal prep, cleaning, and laundry  REHAB POTENTIAL: Excellent  CLINICAL DECISION MAKING: Stable/uncomplicated  EVALUATION COMPLEXITY: Low   GOALS:   Target date:  09/14/23.             PT LONG TERM GOAL #1    Title independent with a HEP.     Time 4     Period Weeks     Status Partilly met         PT LONG TERM GOAL #2    Title Active right  shoulder flexion to 145 degrees so the patient  can easily reach overhead.     Time 4     Period Weeks     Status On going   120 degrees today         PT LONG TERM GOAL #3    Title Active ER to 70 degrees+ to allow for easily donning/doffing of apparel.     Time 4     Period Weeks     Status On going   60 degrees         PT LONG TERM GOAL #4    Title Increase ROM so patient is able to reach behind back to L3.     Time 4     Period Weeks     Status On going         PT LONG TERM GOAL #5    Title Increase right shoulder strength to a solid 4+/5 to increase stability for performance of functional activities.     Time 4     Period Weeks     Status On going  PT LONG TERM GOAL #6    Title Perform ADL's with pain not > 2-3/10.     Time 6     Period Weeks     Status On going      PLAN:  PT FREQUENCY: 2x/week  PT DURATION: 6 months  PLANNED INTERVENTIONS: 97110-Therapeutic exercises, 97530- Therapeutic activity, [id number]- Neuromuscular re-education, 97535- Self Care, [id number]- Manual therapy, 97014- Electrical stimulation (unattended), 97016- Vasopneumatic device, Cryotherapy, and Moist heat  PLAN FOR NEXT SESSION: AAROM and light strengthening next to body with yellow tband. vasopneumatic on low with pillow between thorax and elbow.    Aftan Vint,CHRIS, PTA 10/27/2023, 9:36 AM

## 2023-10-31 ENCOUNTER — Ambulatory Visit: Attending: Orthopedic Surgery | Admitting: *Deleted

## 2023-10-31 DIAGNOSIS — M25511 Pain in right shoulder: Secondary | ICD-10-CM | POA: Insufficient documentation

## 2023-10-31 DIAGNOSIS — R600 Localized edema: Secondary | ICD-10-CM | POA: Insufficient documentation

## 2023-10-31 DIAGNOSIS — M25611 Stiffness of right shoulder, not elsewhere classified: Secondary | ICD-10-CM | POA: Insufficient documentation

## 2023-10-31 DIAGNOSIS — G8929 Other chronic pain: Secondary | ICD-10-CM | POA: Diagnosis not present

## 2023-10-31 NOTE — Therapy (Signed)
 OUTPATIENT PHYSICAL THERAPY SHOULDER TREATMENT   Patient Name: Elizabeth Gray MRN: [id number] DOB:06/02/1941, 83 y.o., female Today's Date: 10/31/2023  END OF SESSION:  PT End of Session - 10/31/23 1614     Visit Number 17    Number of Visits 20    Date for PT Re-Evaluation 11/10/23    Authorization Type FOTO AT LEAST EVERY 5TH VISIT.  PROGRESS NOTE AT 10TH VISIT.  KX MODIFIER AFTER 15 VISITS.    PT Start Time 1601              Past Medical History:  Diagnosis Date   Allergy    Bursitis    Cancer (HCC) 03/22/2017   Melanoma in situ, lentigo maligna type   Deviated septum    External hemorrhoids without mention of complication 2008   Colonoscopy    Fatigue    GERD (gastroesophageal reflux disease)    Headache(784.0)    migraines   Heat rash    under the breasts.Marland Kitchenappeared on monday.Marland KitchenMarland KitchenBurning & itching, uses cortisone   Hypothyroidism    Iron deficiency anemia, unspecified    Lower esophageal ring 2008   EGD   Nonorganic sleep disorder, unspecified    Osteoarthritis    Osteoporosis    Pancreatitis    Personal history of colonic polyps    Polio 1948   Pulmonary sarcoidosis (HCC)    Sleep apnea    [facility]ap [facility]   Vitamin B12 deficiency    Vitamin D deficiency    Past Surgical History:  Procedure Laterality Date   APPENDECTOMY  1988   CARPAL TUNNEL RELEASE  2002   rt   CHOLECYSTECTOMY  2000   COLONOSCOPY  12/19/2006   Multiple diminutive polyps destroyed-removed (hyperpastic), internal hemorrhoids   ESOPHAGOGASTRODUODENOSCOPY  12/19/2006   lower esophageal ring dilated   HAND SURGERY  1997   left   KNEE ARTHROSCOPY Bilateral    multiple 2 on lft 1 on rt   LUMBAR DISC SURGERY  1995   Melanoma Removal  Right 2018   right face   ROTATOR CUFF REPAIR Left    Nov 29, 2021   TOE SURGERY Left    left foot next to little toe  joint rem   TOTAL KNEE ARTHROPLASTY Right 05/09/2014   Procedure: RIGHT TOTAL KNEE ARTHROPLASTY;   Surgeon: Verlee Rossetti, MD;  Location: [facility] OR;  Service: Orthopedics;  Laterality: Right;   TOTAL KNEE ARTHROPLASTY Left 08/06/2021   Procedure: TOTAL KNEE ARTHROPLASTY;  Surgeon: Beverely Low, MD;  Location: [facility] ORS;  Service: Orthopedics;  Laterality: Left;   UPPER GASTROINTESTINAL ENDOSCOPY     WISDOM TOOTH EXTRACTION     Patient Active Problem List   Diagnosis Date Noted   Preop exam for internal medicine 06/19/2023   Aortic atherosclerosis (HCC) 10/19/2022   Elevated coronary artery calcium score 10/19/2022   Edema 08/29/2022   Palpitations 01/24/2022   Abdominal hernia 11/24/2021   Anemia 11/24/2021   Asthma 11/24/2021   Decreased estrogen level 11/24/2021   Syncope 11/24/2021   Sphenoid sinusitis 11/24/2021   Rotator cuff tear 11/22/2021   Full thickness rotator cuff tear 09/30/2021   Insomnia 09/23/2020   Dyslipidemia 09/23/2020   RLS (restless legs syndrome) 07/20/2020   Drug side effects, initial encounter 07/20/2020   Coronary atherosclerosis 03/18/2019   Post-traumatic headache, not intractable 12/04/2018   Grief 09/12/2018   Atrophic vaginitis 09/06/2017   Burning sensation of mouth 09/06/2017   Osteopenia 09/05/2016   Upper  respiratory infection 09/05/2016   Chest pain, atypical 03/04/2016   Headache 07/19/2013   Tinnitus 07/19/2013   Dry skin dermatitis 08/31/2012   Left hip pain 08/31/2012   Cerumen impaction 08/31/2012   Well adult exam 05/25/2012   Upper abdominal pain-chronic 04/02/2011   Thumb pain 03/11/2011   Shoulder pain 03/11/2011   Bilateral primary osteoarthritis of knee 09/10/2010   VERTIGO 03/05/2010   OSA on CPAP 07/22/2008   B12 deficiency 08/16/2007   Vitamin D deficiency 08/16/2007   Nonorganic sleep disorder 05/10/2007   SARCOIDOSIS, PULMONARY 05/09/2007   Hypothyroidism 05/09/2007   Allergic rhinitis 05/09/2007   GERD 05/09/2007   History of colonic polyps 05/09/2007   REFERRING PROVIDER: Malon Kindle MD  REFERRING DIAG: S/p  right RTC repair.  THERAPY DIAG:  Chronic right shoulder pain  Stiffness of right shoulder, not elsewhere classified  Localized edema  Rationale for Evaluation and Treatment: Rehabilitation  ONSET DATE: 08/28/23 (surgery date).  SUBJECTIVE:            Back to MD 11-16-23 .                                                                                      SUBJECTIVE STATEMENT: RT shldr 3/10 still sore , did some laundry                                                                                   PERTINENT HISTORY: See above.  PAIN:  Are you having pain? Yes: NPRS scale: 3/10. Pain location: Right shoulder. Pain description: Sharp. Aggravating factors: Movement. Relieving factors: Medication.  PRECAUTIONS: Other: Progress per protocol.      WEIGHT BEARING RESTRICTIONS:  No right UE weight bearing.    FALLS:  Has patient fallen in last 6 months? No  LIVING ENVIRONMENT: Lives in: House/apartment Has following equipment at home: None  OCCUPATION: "Semi-Retired."  PLOF: Independent  PATIENT GOALS:Use right UE without pain.  NEXT MD VISIT:   OBJECTIVE:  Note: Objective measures were completed at Evaluation unless otherwise noted.  UPPER EXTREMITY ROM:   In supine:  Gentle right shoulder PROM to 75 degrees of flexion and ER to 10 degrees.  PALPATION:  Right shoulder incision cover with sterile gauze (patient's performed for her).  She c/o diffuse shoulder pain currently and her shoulder is visibly swollen.  TREATMENT DATE: 10/27/23    8weeks   10-23-23  RT shldr Pulleys x 6 mins Standing ranger x 5 mins CW/CCW and flexion Wall ladder Standing yellow tband Rows, IR, ER and ext  2x10  Ball/wall x 10 Manual:   PROM/ PAROM for flexion, ABD, as well as ER as well as rhythmic stab. for with Pt in hook lying on mat and feet elevated     Vasopneumatic  x 15 mins RT shldr low   PATIENT EDUCATION: Education details: See below (this exercise was part of her hospital discharge packet). Person educated: Patient Education method: Explanation, Demonstration, Tactile cues, Verbal cues, and Handouts Education comprehension: verbalized understanding and returned demonstration  HOME EXERCISE PROGRAM: HOME EXERCISE PROGRAM Created by [place] Applegate Jan 30th, 2025 View at [email or web address] using code: [email or web address]  Page 1 of 1 1 Exercise WAND EXTERNAL ROTATION - SUPINE ER Lie on your back holding a cane or wand with both hands.  On the affected side, place a small rolled up towel or pillow under your elbow. Maintain approx. 90 degree bend at the elbow with your arm approximately 30-45 degrees away from your side. GENTLE. PAIN-FREE Use your other arm to pull the wand/cane to rotate the affected arm back into a stretch. Hold and then return to starting position and then repeat. Repeat 10 Times Hold 15 Seconds Complete 1 Set Perform 4 Times a Day  ASSESSMENT:  CLINICAL IMPRESSION: Pt arrived today reporting increased soreness but doing OK. Pt was able to continiue with AAROM, AROM as well as light strengthening exs.AROM flexion to 120 degrees in standing today.   OBJECTIVE IMPAIRMENTS: decreased activity tolerance, decreased ROM, increased edema, and pain.   ACTIVITY LIMITATIONS: carrying, lifting, and reach over head  PARTICIPATION LIMITATIONS: meal prep, cleaning, and laundry  REHAB POTENTIAL: Excellent  CLINICAL DECISION MAKING: Stable/uncomplicated  EVALUATION COMPLEXITY: Low   GOALS:   Target date:  09/14/23.             PT LONG TERM GOAL #1    Title independent with a HEP.     Time 4     Period Weeks     Status Partilly met         PT LONG TERM GOAL #2    Title Active right  shoulder flexion to 145 degrees so the patient can easily reach overhead.     Time 4     Period Weeks     Status On going    120 degrees today         PT LONG TERM GOAL #3    Title Active ER to 70 degrees+ to allow for easily donning/doffing of apparel.     Time 4     Period Weeks     Status On going   60 degrees         PT LONG TERM GOAL #4    Title Increase ROM so patient is able to reach behind back to L3.     Time 4     Period Weeks     Status On going         PT LONG TERM GOAL #5    Title Increase right shoulder strength to a solid 4+/5 to increase stability for performance of functional activities.     Time 4     Period Weeks     Status On going         PT LONG TERM GOAL #6    Title Perform ADL's  with pain not > 2-3/10.     Time 6     Period Weeks     Status On going      PLAN:  PT FREQUENCY: 2x/week  PT DURATION: 6 months  PLANNED INTERVENTIONS: 97110-Therapeutic exercises, 97530- Therapeutic activity, [id number]- Neuromuscular re-education, 97535- Self Care, [id number]- Manual therapy, 97014- Electrical stimulation (unattended), 97016- Vasopneumatic device, Cryotherapy, and Moist heat  PLAN FOR NEXT SESSION: AAROM and light strengthening next to body with yellow tband. vasopneumatic on low with pillow between thorax and elbow.    Aladdin Kollmann,CHRIS, PTA 10/31/2023, 4:14 PM

## 2023-11-03 ENCOUNTER — Encounter: Admitting: *Deleted

## 2023-11-07 ENCOUNTER — Ambulatory Visit: Admitting: *Deleted

## 2023-11-07 DIAGNOSIS — R600 Localized edema: Secondary | ICD-10-CM

## 2023-11-07 DIAGNOSIS — G8929 Other chronic pain: Secondary | ICD-10-CM | POA: Diagnosis not present

## 2023-11-07 DIAGNOSIS — M25511 Pain in right shoulder: Secondary | ICD-10-CM | POA: Diagnosis not present

## 2023-11-07 DIAGNOSIS — M25611 Stiffness of right shoulder, not elsewhere classified: Secondary | ICD-10-CM

## 2023-11-07 NOTE — Therapy (Signed)
 OUTPATIENT PHYSICAL THERAPY SHOULDER TREATMENT   Patient Name: Elizabeth Gray MRN: [id number] DOB:August 14, 1940, 83 y.o., female Today's Date: 11/07/2023  END OF SESSION:  PT End of Session - 11/07/23 0954     Visit Number 18    Number of Visits 20    Date for PT Re-Evaluation 11/10/23    Authorization Type FOTO AT LEAST EVERY 5TH VISIT.  PROGRESS NOTE AT 10TH VISIT.  KX MODIFIER AFTER 15 VISITS.              Past Medical History:  Diagnosis Date   Allergy    Bursitis    Cancer (HCC) 03/22/2017   Melanoma in situ, lentigo maligna type   Deviated septum    External hemorrhoids without mention of complication 2008   Colonoscopy    Fatigue    GERD (gastroesophageal reflux disease)    Headache(784.0)    migraines   Heat rash    under the breasts.Marland Kitchenappeared on monday.Marland KitchenMarland KitchenBurning & itching, uses cortisone   Hypothyroidism    Iron deficiency anemia, unspecified    Lower esophageal ring 2008   EGD   Nonorganic sleep disorder, unspecified    Osteoarthritis    Osteoporosis    Pancreatitis    Personal history of colonic polyps    Polio 1948   Pulmonary sarcoidosis (HCC)    Sleep apnea    [facility]ap [facility]   Vitamin B12 deficiency    Vitamin D deficiency    Past Surgical History:  Procedure Laterality Date   APPENDECTOMY  1988   CARPAL TUNNEL RELEASE  2002   rt   CHOLECYSTECTOMY  2000   COLONOSCOPY  12/19/2006   Multiple diminutive polyps destroyed-removed (hyperpastic), internal hemorrhoids   ESOPHAGOGASTRODUODENOSCOPY  12/19/2006   lower esophageal ring dilated   HAND SURGERY  1997   left   KNEE ARTHROSCOPY Bilateral    multiple 2 on lft 1 on rt   LUMBAR DISC SURGERY  1995   Melanoma Removal  Right 2018   right face   ROTATOR CUFF REPAIR Left    Nov 29, 2021   TOE SURGERY Left    left foot next to little toe  joint rem   TOTAL KNEE ARTHROPLASTY Right 05/09/2014   Procedure: RIGHT TOTAL KNEE ARTHROPLASTY;  Surgeon: Verlee Rossetti,  MD;  Location: [facility] OR;  Service: Orthopedics;  Laterality: Right;   TOTAL KNEE ARTHROPLASTY Left 08/06/2021   Procedure: TOTAL KNEE ARTHROPLASTY;  Surgeon: Beverely Low, MD;  Location: [facility];  Service: Orthopedics;  Laterality: Left;   UPPER GASTROINTESTINAL ENDOSCOPY     WISDOM TOOTH EXTRACTION     Patient Active Problem List   Diagnosis Date Noted   Preop exam for internal medicine 06/19/2023   Aortic atherosclerosis (HCC) 10/19/2022   Elevated coronary artery calcium score 10/19/2022   Edema 08/29/2022   Palpitations 01/24/2022   Abdominal hernia 11/24/2021   Anemia 11/24/2021   Asthma 11/24/2021   Decreased estrogen level 11/24/2021   Syncope 11/24/2021   Sphenoid sinusitis 11/24/2021   Rotator cuff tear 11/22/2021   Full thickness rotator cuff tear 09/30/2021   Insomnia 09/23/2020   Dyslipidemia 09/23/2020   RLS (restless legs syndrome) 07/20/2020   Drug side effects, initial encounter 07/20/2020   Coronary atherosclerosis 03/18/2019   Post-traumatic headache, not intractable 12/04/2018   Grief 09/12/2018   Atrophic vaginitis 09/06/2017   Burning sensation of mouth 09/06/2017   Osteopenia 09/05/2016   Upper respiratory infection 09/05/2016   Chest pain,  atypical 03/04/2016   Headache 07/19/2013   Tinnitus 07/19/2013   Dry skin dermatitis 08/31/2012   Left hip pain 08/31/2012   Cerumen impaction 08/31/2012   Well adult exam 05/25/2012   Upper abdominal pain-chronic 04/02/2011   Thumb pain 03/11/2011   Shoulder pain 03/11/2011   Bilateral primary osteoarthritis of knee 09/10/2010   VERTIGO 03/05/2010   OSA on CPAP 07/22/2008   B12 deficiency 08/16/2007   Vitamin D deficiency 08/16/2007   Nonorganic sleep disorder 05/10/2007   SARCOIDOSIS, PULMONARY 05/09/2007   Hypothyroidism 05/09/2007   Allergic rhinitis 05/09/2007   GERD 05/09/2007   History of colonic polyps 05/09/2007   REFERRING PROVIDER: Malon Kindle MD  REFERRING DIAG: S/p right RTC  repair.  THERAPY DIAG:  Chronic right shoulder pain  Stiffness of right shoulder, not elsewhere classified  Localized edema  Rationale for Evaluation and Treatment: Rehabilitation  ONSET DATE: 08/28/23 (surgery date).  SUBJECTIVE:            Back to MD 11-16-23 .                                                                                      SUBJECTIVE STATEMENT: RT shldr 3/10 still sore , did some laundry. I raised my arm all the way up this morning!                                                                              PERTINENT HISTORY: See above.  PAIN:  Are you having pain? Yes: NPRS scale: 2/10. Pain location: Right shoulder. Pain description: Sharp. Aggravating factors: Movement. Relieving factors: Medication.  PRECAUTIONS: Other: Progress per protocol.      WEIGHT BEARING RESTRICTIONS:  No right UE weight bearing.    FALLS:  Has patient fallen in last 6 months? No  LIVING ENVIRONMENT: Lives in: House/apartment Has following equipment at home: None  OCCUPATION: "Semi-Retired."  PLOF: Independent  PATIENT GOALS:Use right UE without pain.  NEXT MD VISIT:   OBJECTIVE:  Note: Objective measures were completed at Evaluation unless otherwise noted.  UPPER EXTREMITY ROM:   In supine:  Gentle right shoulder PROM to 75 degrees of flexion and ER to 10 degrees.  PALPATION:  Right shoulder incision cover with sterile gauze (patient's performed for her).  She c/o diffuse shoulder pain currently and her shoulder is visibly swollen.  TREATMENT DATE: 11/07/23    8weeks   10-23-23  RT shldr Pulleys x 6 mins Standing ranger x 5 mins CW/CCW and flexion Standing flexion x 10 Standing yellow tband Rows, IR, ER and ext , punches 2x10  Ball/wall x 10 Supine flexion and punches Manual:   PROM/ PAROM for  ABD, as well as ER with Pt in  hook lying on mat and feet elevated    Vasopneumatic  x 15 mins RT shldr  on  low   PATIENT EDUCATION: Education details: See below (this exercise was part of her hospital discharge packet). Person educated: Patient Education method: Explanation, Demonstration, Tactile cues, Verbal cues, and Handouts Education comprehension: verbalized understanding and returned demonstration  HOME EXERCISE PROGRAM: HOME EXERCISE PROGRAM Created by [place] Applegate Jan 30th, 2025 View at [email or web address] using code: [email or web address]  Page 1 of 1 1 Exercise WAND EXTERNAL ROTATION - SUPINE ER Lie on your back holding a cane or wand with both hands.  On the affected side, place a small rolled up towel or pillow under your elbow. Maintain approx. 90 degree bend at the elbow with your arm approximately 30-45 degrees away from your side. GENTLE. PAIN-FREE Use your other arm to pull the wand/cane to rotate the affected arm back into a stretch. Hold and then return to starting position and then repeat. Repeat 10 Times Hold 15 Seconds Complete 1 Set Perform 4 Times a Day  ASSESSMENT:  CLINICAL IMPRESSION: Pt arrived today reporting that she was able to finally raise her RT arm all the way up. Pt was able to continiue with AAROM, AROM as well as light strengthening exs with added punches today.AROM flexion to 140 degrees in standing today.   OBJECTIVE IMPAIRMENTS: decreased activity tolerance, decreased ROM, increased edema, and pain.   ACTIVITY LIMITATIONS: carrying, lifting, and reach over head  PARTICIPATION LIMITATIONS: meal prep, cleaning, and laundry  REHAB POTENTIAL: Excellent  CLINICAL DECISION MAKING: Stable/uncomplicated  EVALUATION COMPLEXITY: Low   GOALS:   Target date:  09/14/23.             PT LONG TERM GOAL #1    Title independent with a HEP.     Time 4     Period Weeks     Status Partilly met         PT LONG TERM GOAL #2    Title Active right  shoulder flexion to 145  degrees so the patient can easily reach overhead.     Time 4     Period Weeks     Status On going   120 degrees today         PT LONG TERM GOAL #3    Title Active ER to 70 degrees+ to allow for easily donning/doffing of apparel.     Time 4     Period Weeks     Status On going   60 degrees         PT LONG TERM GOAL #4    Title Increase ROM so patient is able to reach behind back to L3.     Time 4     Period Weeks     Status On going         PT LONG TERM GOAL #5    Title Increase right shoulder strength to a solid 4+/5 to increase stability for performance of functional activities.     Time 4     Period Weeks     Status On going  PT LONG TERM GOAL #6    Title Perform ADL's with pain not > 2-3/10.     Time 6     Period Weeks     Status On going      PLAN:  PT FREQUENCY: 2x/week  PT DURATION: 6 months  PLANNED INTERVENTIONS: 97110-Therapeutic exercises, 97530- Therapeutic activity, [id number]- Neuromuscular re-education, 97535- Self Care, 16109- Manual therapy, 97014- Electrical stimulation (unattended), 97016- Vasopneumatic device, Cryotherapy, and Moist heat  PLAN FOR NEXT SESSION: AAROM and light strengthening next to body with yellow tband. vasopneumatic on low with pillow between thorax and elbow.    Michelene Keniston,CHRIS, PTA 11/07/2023, 9:55 AM

## 2023-11-09 ENCOUNTER — Ambulatory Visit: Admitting: *Deleted

## 2023-11-09 ENCOUNTER — Encounter: Payer: Self-pay | Admitting: *Deleted

## 2023-11-09 DIAGNOSIS — M25611 Stiffness of right shoulder, not elsewhere classified: Secondary | ICD-10-CM | POA: Diagnosis not present

## 2023-11-09 DIAGNOSIS — M25511 Pain in right shoulder: Secondary | ICD-10-CM | POA: Diagnosis not present

## 2023-11-09 DIAGNOSIS — R600 Localized edema: Secondary | ICD-10-CM

## 2023-11-09 DIAGNOSIS — G8929 Other chronic pain: Secondary | ICD-10-CM | POA: Diagnosis not present

## 2023-11-09 NOTE — Therapy (Signed)
 OUTPATIENT PHYSICAL THERAPY SHOULDER TREATMENT   Patient Name: Elizabeth Gray MRN: [id number] DOB:1941-02-07, 83 y.o., female Today's Date: 11/09/2023  END OF SESSION:  PT End of Session - 11/09/23 1114     Visit Number 19    Number of Visits 20    Date for PT Re-Evaluation 11/10/23    Authorization Type FOTO AT LEAST EVERY 5TH VISIT.  PROGRESS NOTE AT 10TH VISIT.  KX MODIFIER AFTER 15 VISITS.    PT Start Time 1100    PT Stop Time 1152    PT Time Calculation (min) 52 min              Past Medical History:  Diagnosis Date   Allergy    Bursitis    Cancer (HCC) 03/22/2017   Melanoma in situ, lentigo maligna type   Deviated septum    External hemorrhoids without mention of complication 2008   Colonoscopy    Fatigue    GERD (gastroesophageal reflux disease)    Headache(784.0)    migraines   Heat rash    under the breasts.Marland Kitchenappeared on monday.Marland KitchenMarland KitchenBurning & itching, uses cortisone   Hypothyroidism    Iron deficiency anemia, unspecified    Lower esophageal ring 2008   EGD   Nonorganic sleep disorder, unspecified    Osteoarthritis    Osteoporosis    Pancreatitis    Personal history of colonic polyps    Polio 1948   Pulmonary sarcoidosis (HCC)    Sleep apnea    [facility]ap [facility]   Vitamin B12 deficiency    Vitamin D deficiency    Past Surgical History:  Procedure Laterality Date   APPENDECTOMY  1988   CARPAL TUNNEL RELEASE  2002   rt   CHOLECYSTECTOMY  2000   COLONOSCOPY  12/19/2006   Multiple diminutive polyps destroyed-removed (hyperpastic), internal hemorrhoids   ESOPHAGOGASTRODUODENOSCOPY  12/19/2006   lower esophageal ring dilated   HAND SURGERY  1997   left   KNEE ARTHROSCOPY Bilateral    multiple 2 on lft 1 on rt   LUMBAR DISC SURGERY  1995   Melanoma Removal  Right 2018   right face   ROTATOR CUFF REPAIR Left    Nov 29, 2021   TOE SURGERY Left    left foot next to little toe  joint rem   TOTAL KNEE ARTHROPLASTY Right  05/09/2014   Procedure: RIGHT TOTAL KNEE ARTHROPLASTY;  Surgeon: Verlee Rossetti, MD;  Location: [facility] OR;  Service: Orthopedics;  Laterality: Right;   TOTAL KNEE ARTHROPLASTY Left 08/06/2021   Procedure: TOTAL KNEE ARTHROPLASTY;  Surgeon: Beverely Low, MD;  Location: [facility];  Service: Orthopedics;  Laterality: Left;   UPPER GASTROINTESTINAL ENDOSCOPY     WISDOM TOOTH EXTRACTION     Patient Active Problem List   Diagnosis Date Noted   Preop exam for internal medicine 06/19/2023   Aortic atherosclerosis (HCC) 10/19/2022   Elevated coronary artery calcium score 10/19/2022   Edema 08/29/2022   Palpitations 01/24/2022   Abdominal hernia 11/24/2021   Anemia 11/24/2021   Asthma 11/24/2021   Decreased estrogen level 11/24/2021   Syncope 11/24/2021   Sphenoid sinusitis 11/24/2021   Rotator cuff tear 11/22/2021   Full thickness rotator cuff tear 09/30/2021   Insomnia 09/23/2020   Dyslipidemia 09/23/2020   RLS (restless legs syndrome) 07/20/2020   Drug side effects, initial encounter 07/20/2020   Coronary atherosclerosis 03/18/2019   Post-traumatic headache, not intractable 12/04/2018   Grief 09/12/2018   Atrophic  vaginitis 09/06/2017   Burning sensation of mouth 09/06/2017   Osteopenia 09/05/2016   Upper respiratory infection 09/05/2016   Chest pain, atypical 03/04/2016   Headache 07/19/2013   Tinnitus 07/19/2013   Dry skin dermatitis 08/31/2012   Left hip pain 08/31/2012   Cerumen impaction 08/31/2012   Well adult exam 05/25/2012   Upper abdominal pain-chronic 04/02/2011   Thumb pain 03/11/2011   Shoulder pain 03/11/2011   Bilateral primary osteoarthritis of knee 09/10/2010   VERTIGO 03/05/2010   OSA on CPAP 07/22/2008   B12 deficiency 08/16/2007   Vitamin D deficiency 08/16/2007   Nonorganic sleep disorder 05/10/2007   SARCOIDOSIS, PULMONARY 05/09/2007   Hypothyroidism 05/09/2007   Allergic rhinitis 05/09/2007   GERD 05/09/2007   History of colonic polyps 05/09/2007    REFERRING PROVIDER: Malon Kindle MD  REFERRING DIAG: S/p right RTC repair.  THERAPY DIAG:  Chronic right shoulder pain  Stiffness of right shoulder, not elsewhere classified  Localized edema  Rationale for Evaluation and Treatment: Rehabilitation  ONSET DATE: 08/28/23 (surgery date).  SUBJECTIVE:            Back to MD 11-16-23 .                                                                                      SUBJECTIVE STATEMENT: RT shldr 2/10 soreness and doing good, but using a walker today due to RT hip pain. To MD next Thursday.                                                                             PERTINENT HISTORY: See above.  PAIN:  Are you having pain? Yes: NPRS scale: 2/10. Pain location: Right shoulder. Pain description: Sharp. Aggravating factors: Movement. Relieving factors: Medication.  PRECAUTIONS: Other: Progress per protocol.      WEIGHT BEARING RESTRICTIONS:  No right UE weight bearing.    FALLS:  Has patient fallen in last 6 months? No  LIVING ENVIRONMENT: Lives in: House/apartment Has following equipment at home: None  OCCUPATION: "Semi-Retired."  PLOF: Independent  PATIENT GOALS:Use right UE without pain.  NEXT MD VISIT:   OBJECTIVE:  Note: Objective measures were completed at Evaluation unless otherwise noted.  UPPER EXTREMITY ROM:   In supine:  Gentle right shoulder PROM to 75 degrees of flexion and ER to 10 degrees.  PALPATION:  Right shoulder incision cover with sterile gauze (patient's performed for her).  She c/o diffuse shoulder pain currently and her shoulder is visibly swollen.  TREATMENT DATE: 11/09/23    10 weeks   11-06-23  RT shldr Pulleys x 6 mins Seated ranger x 5 mins CW/CCW and flexion Standing flexion x 10 Seated   yellow tband Rows, IR, ER and ext , punches 2x10   Ball/wall Supine flexion and punches Manual:   PROM/ PAROM   RT shldr seated AROM flexion 155 degrees, ER 70 degrees,   Vasopneumatic  x 15 mins RT shldr  on  low   PATIENT EDUCATION: Education details: See below (this exercise was part of her hospital discharge packet). Person educated: Patient Education method: Explanation, Demonstration, Tactile cues, Verbal cues, and Handouts Education comprehension: verbalized understanding and returned demonstration  HOME EXERCISE PROGRAM: HOME EXERCISE PROGRAM Created by [place] Applegate Jan 30th, 2025 View at [email or web address]-[email or web address] using code: [email or web address]  Page 1 of 1 1 Exercise WAND EXTERNAL ROTATION - SUPINE ER Lie on your back holding a cane or wand with both hands.  On the affected side, place a small rolled up towel or pillow under your elbow. Maintain approx. 90 degree bend at the elbow with your arm approximately 30-45 degrees away from your side. GENTLE. PAIN-FREE Use your other arm to pull the wand/cane to rotate the affected arm back into a stretch. Hold and then return to starting position and then repeat. Repeat 10 Times Hold 15 Seconds Complete 1 Set Perform 4 Times a Day  ASSESSMENT:  CLINICAL IMPRESSION: Pt arrived today reporting that her RT shldr is doing good, but having to use a walker now  due to RT hip pain. Therex was performed in seated position due to RT hip pain.  Pt was able to reach  AROM  for flexion 155 degrees, ER to 70 degrees. And continues to make progress toward LTGs   OBJECTIVE IMPAIRMENTS: decreased activity tolerance, decreased ROM, increased edema, and pain.   ACTIVITY LIMITATIONS: carrying, lifting, and reach over head  PARTICIPATION LIMITATIONS: meal prep, cleaning, and laundry  REHAB POTENTIAL: Excellent  CLINICAL DECISION MAKING: Stable/uncomplicated  EVALUATION COMPLEXITY: Low   GOALS:   Target date:  09/14/23.             PT LONG TERM GOAL #1    Title independent with a HEP.      Time 4     Period Weeks     Status Partilly met         PT LONG TERM GOAL #2    Title Active right  shoulder flexion to 145 degrees so the patient can easily reach overhead.     Time 4     Period Weeks     Status MET  155 degrees          PT LONG TERM GOAL #3    Title Active ER to 70 degrees+ to allow for easily donning/doffing of apparel.     Time 4     Period Weeks     Status  MET degrees         PT LONG TERM GOAL #4    Title Increase ROM so patient is able to reach behind back to L3.     Time 4     Period Weeks     Status On going         PT LONG TERM GOAL #5    Title Increase right shoulder strength to a solid 4+/5 to increase stability for performance of functional activities.     Time 4     Period Weeks  Status On going         PT LONG TERM GOAL #6    Title Perform ADL's with pain not > 2-3/10.     Time 6     Period Weeks     Status On going      PLAN:  PT FREQUENCY: 2x/week  PT DURATION: 6 months  PLANNED INTERVENTIONS: 97110-Therapeutic exercises, 97530- Therapeutic activity, [id number]- Neuromuscular re-education, 97535- Self Care, 62130- Manual therapy, 97014- Electrical stimulation (unattended), 97016- Vasopneumatic device, Cryotherapy, and Moist heat  PLAN FOR NEXT SESSION: AAROM and light strengthening next to body with yellow tband. vasopneumatic on low with pillow between thorax and elbow.    Lurlean Kernen,CHRIS, PTA 11/09/2023, 12:50 PM

## 2023-11-10 DIAGNOSIS — M25551 Pain in right hip: Secondary | ICD-10-CM | POA: Diagnosis not present

## 2023-11-10 DIAGNOSIS — M545 Low back pain, unspecified: Secondary | ICD-10-CM | POA: Diagnosis not present

## 2023-11-13 DIAGNOSIS — C43111 Malignant melanoma of right upper eyelid, including canthus: Secondary | ICD-10-CM | POA: Diagnosis not present

## 2023-11-13 DIAGNOSIS — Z87891 Personal history of nicotine dependence: Secondary | ICD-10-CM | POA: Diagnosis not present

## 2023-11-13 DIAGNOSIS — Z7902 Long term (current) use of antithrombotics/antiplatelets: Secondary | ICD-10-CM | POA: Diagnosis not present

## 2023-11-13 DIAGNOSIS — L814 Other melanin hyperpigmentation: Secondary | ICD-10-CM | POA: Diagnosis not present

## 2023-11-13 DIAGNOSIS — K219 Gastro-esophageal reflux disease without esophagitis: Secondary | ICD-10-CM | POA: Diagnosis not present

## 2023-11-13 DIAGNOSIS — T8189XA Other complications of procedures, not elsewhere classified, initial encounter: Secondary | ICD-10-CM | POA: Diagnosis not present

## 2023-11-13 DIAGNOSIS — L578 Other skin changes due to chronic exposure to nonionizing radiation: Secondary | ICD-10-CM | POA: Diagnosis not present

## 2023-11-13 DIAGNOSIS — Z79890 Hormone replacement therapy (postmenopausal): Secondary | ICD-10-CM | POA: Diagnosis not present

## 2023-11-13 DIAGNOSIS — Z7722 Contact with and (suspected) exposure to environmental tobacco smoke (acute) (chronic): Secondary | ICD-10-CM | POA: Diagnosis not present

## 2023-11-13 DIAGNOSIS — E039 Hypothyroidism, unspecified: Secondary | ICD-10-CM | POA: Diagnosis not present

## 2023-11-13 DIAGNOSIS — L7621 Postprocedural hemorrhage and hematoma of skin and subcutaneous tissue following a dermatologic procedure: Secondary | ICD-10-CM | POA: Diagnosis not present

## 2023-11-13 DIAGNOSIS — Z79899 Other long term (current) drug therapy: Secondary | ICD-10-CM | POA: Diagnosis not present

## 2023-11-13 HISTORY — PX: SKIN LESION EXCISION: SHX2412

## 2023-11-14 DIAGNOSIS — C4339 Malignant melanoma of other parts of face: Secondary | ICD-10-CM | POA: Diagnosis not present

## 2023-11-15 DIAGNOSIS — M19042 Primary osteoarthritis, left hand: Secondary | ICD-10-CM | POA: Diagnosis not present

## 2023-11-15 DIAGNOSIS — G5602 Carpal tunnel syndrome, left upper limb: Secondary | ICD-10-CM | POA: Diagnosis not present

## 2023-11-16 DIAGNOSIS — Z4789 Encounter for other orthopedic aftercare: Secondary | ICD-10-CM | POA: Insufficient documentation

## 2023-11-16 DIAGNOSIS — M19042 Primary osteoarthritis, left hand: Secondary | ICD-10-CM | POA: Insufficient documentation

## 2023-11-16 DIAGNOSIS — G5602 Carpal tunnel syndrome, left upper limb: Secondary | ICD-10-CM | POA: Insufficient documentation

## 2023-11-20 ENCOUNTER — Telehealth: Payer: Self-pay | Admitting: Internal Medicine

## 2023-11-20 MED ORDER — OMEPRAZOLE-SODIUM BICARBONATE 40-1100 MG PO CAPS: 1.0000 | ORAL_CAPSULE | Freq: Every day | ORAL | 0 refills | Status: DC

## 2023-11-20 NOTE — Telephone Encounter (Signed)
 I spoke with Elizabeth Gray and refilled her Zegerid . I told her it is not on the formulary and will most likely need a PA done. She has 7 days left. I explained to her how the PA team works and she will call us  back if any issues arise.

## 2023-11-20 NOTE — Telephone Encounter (Signed)
 Inbound call from patient, would like omeprazole  refilled, patient has appointment on 6/26 at 9:50 AM.

## 2023-11-21 ENCOUNTER — Encounter: Admitting: *Deleted

## 2023-11-22 DIAGNOSIS — M9904 Segmental and somatic dysfunction of sacral region: Secondary | ICD-10-CM | POA: Diagnosis not present

## 2023-11-22 DIAGNOSIS — M9905 Segmental and somatic dysfunction of pelvic region: Secondary | ICD-10-CM | POA: Diagnosis not present

## 2023-11-22 DIAGNOSIS — M9903 Segmental and somatic dysfunction of lumbar region: Secondary | ICD-10-CM | POA: Diagnosis not present

## 2023-11-22 DIAGNOSIS — M5431 Sciatica, right side: Secondary | ICD-10-CM | POA: Diagnosis not present

## 2023-11-23 ENCOUNTER — Encounter: Admitting: *Deleted

## 2023-11-23 HISTORY — PX: CARPAL TUNNEL RELEASE: SHX101

## 2023-11-23 NOTE — Progress Notes (Unsigned)
 Cardiology Office Note:   Date:  11/24/2023  ID:  Elizabeth Gray, Elizabeth Gray [birth year], MRN [id number] PCP: Genia Kettering, MD  [facility] Providers Cardiologist:  Eilleen Grates, MD {  History of Present Illness:   Elizabeth Gray is a 83 y.o. female who presents for evaluation of palpitations.  She was referred by Elizabeth Gray, Elizabeth Bellman, MD. she had a work-up for syncope.  This happened in April 2023.  Since that time she has had no further syncope.  Her palpitations been well-controlled on low-dose beta-blocker.  She did have 1 episode of chest discomfort sometime ago.  This was a substernal discomfort.  It came and went spontaneously.  She has not had this in several weeks.  She has 4 part-time jobs.  She stays physically active with this and does not bring on any symptoms.  Is not having any new shortness of breath, PND or orthopnea.  She is not having any palpitations, presyncope or syncope.  She has had some weight gain because she has been eating too much.  She has had some very mild ankle edema.   She did have coronary calcium with a score of 289 on CT in 2020.   She had a low risk perfusion study in 2017.  Echo in in 2024 February demonstrated an EF of 70 to 75%.  She had a negative POET (Plain Old Exercise Treadmill) in 2024.  Since I last saw her she has been okay.  She is battling a hip problem but she is not having any cardiovascular complaints.  In particular she is not having any chest discomfort.  She has had no new shortness of breath, PND or orthopnea.  She has had no palpitations, presyncope or syncope.  She has had some lower extremity swelling in takes some old torsemide that she has in the house occasionally.  She thinks she has been on her feet more and has gotten into some salt.  ROS: As stated in the HPI and negative for all other systems.  Studies Reviewed:    EKG:   EKG Interpretation Date/Time:  Friday November 24 2023 09:54:40 EDT Ventricular Rate:  73 PR  Interval:  158 QRS Duration:  116 QT Interval:  432 QTC Calculation: 475 R Axis:   53  Text Interpretation: Normal sinus rhythm Incomplete right bundle branch block When compared with ECG of 23-Nov-2021 09:32, No significant change since last tracing Confirmed by Eilleen Grates ([id number]) on 11/24/2023 10:04:46 AM     Risk Assessment/Calculations:              Physical Exam:   VS:  BP 138/82   Pulse 72   Ht 5\' 4"  (1.626 m)   Wt 178 lb 3.2 oz (80.8 kg)   SpO2 97%   BMI 30.59 kg/m    Wt Readings from Last 3 Encounters:  11/24/23 178 lb 3.2 oz (80.8 kg)  06/19/23 177 lb (80.3 kg)  03/06/23 162 lb (73.5 kg)     GEN: Well nourished, well developed in no acute distress NECK: No JVD; No carotid bruits CARDIAC: RRR, no murmurs, rubs, gallops RESPIRATORY:  Clear to auscultation without rales, wheezing or rhonchi  ABDOMEN: Soft, non-tender, non-distended EXTREMITIES:  Mild bilateral leg edema; No deformity   ASSESSMENT AND PLAN:      PALPITATIONS:   Patient had no further tachyarrhythmias.  No change in therapy.   SYNCOPE:   She has had no syncope.  No further workup.   ELEVATED CORONARY CALCIUM:  This was 69th percentile for her age.  She had a negative  POET (Plain Old Exercise Treadmill) in 2024 no symptoms since then.  No change in therapy.    AORTIC ATHEROSCLEROSIS:   She will continue with aggressive risk reduction.  No change in therapy.  EDEMA: She takes low-dose torsemide and I think that is okay.  I gave her a prescription for this       Follow up with me as needed  Signed, Eilleen Grates, MD

## 2023-11-24 ENCOUNTER — Ambulatory Visit (INDEPENDENT_AMBULATORY_CARE_PROVIDER_SITE_OTHER): Admitting: Cardiology

## 2023-11-24 ENCOUNTER — Encounter: Payer: Self-pay | Admitting: Cardiology

## 2023-11-24 ENCOUNTER — Encounter: Payer: Self-pay | Admitting: *Deleted

## 2023-11-24 VITALS — BP 138/82 | HR 72 | Ht 64.0 in | Wt 178.2 lb

## 2023-11-24 DIAGNOSIS — I700 Atherosclerosis of aorta: Secondary | ICD-10-CM

## 2023-11-24 DIAGNOSIS — R931 Abnormal findings on diagnostic imaging of heart and coronary circulation: Secondary | ICD-10-CM

## 2023-11-24 DIAGNOSIS — R002 Palpitations: Secondary | ICD-10-CM | POA: Diagnosis not present

## 2023-11-24 MED ORDER — TORSEMIDE 20 MG PO TABS: 20.0000 mg | ORAL_TABLET | Freq: Every day | ORAL | 3 refills | Status: DC | PRN

## 2023-11-24 NOTE — Patient Instructions (Signed)
 Medication Instructions:   Torsemide 20 mg Daily as needed for swelling   *If you need a refill on your cardiac medications before your next appointment, please call your pharmacy*  Lab Work: NONE   If you have labs (blood work) drawn today and your tests are completely normal, you will receive your results only by: MyChart Message (if you have MyChart) OR A paper copy in the mail If you have any lab test that is abnormal or we need to change your treatment, we will call you to review the results.  Testing/Procedures: NONE   Follow-Up: At [facility], you and your health needs are our priority.  As part of our continuing mission to provide you with exceptional heart care, our providers are all part of one team.  This team includes your primary Cardiologist (physician) and Advanced Practice Providers or APPs (Physician Assistants and Nurse Practitioners) who all work together to provide you with the care you need, when you need it.  Your next appointment:    As Needed   Provider:   Eilleen Grates, MD    We recommend signing up for the patient portal called "MyChart".  Sign up information is provided on this After Visit Summary.  MyChart is used to connect with patients for Virtual Visits (Telemedicine).  Patients are able to view lab/test results, encounter notes, upcoming appointments, etc.  Non-urgent messages can be sent to your provider as well.   To learn more about what you can do with MyChart, go to [email or web address].   Other Instructions Thank you for choosing [facility]!

## 2023-11-27 DIAGNOSIS — Z23 Encounter for immunization: Secondary | ICD-10-CM | POA: Diagnosis not present

## 2023-11-28 ENCOUNTER — Other Ambulatory Visit: Payer: Self-pay | Admitting: Internal Medicine

## 2023-11-29 NOTE — Therapy (Signed)
 OUTPATIENT PHYSICAL THERAPY SHOULDER TREATMENT   Patient Name: Elizabeth Gray MRN: [id number] DOB:11-06-1940, 83 y.o., female Today's Date: 11/29/2023  END OF SESSION:     Past Medical History:  Diagnosis Date   Allergy     Bursitis    Cancer (HCC) 03/22/2017   Melanoma in situ, lentigo maligna type   Deviated septum    External hemorrhoids without mention of complication 2008   Colonoscopy    Fatigue    GERD (gastroesophageal reflux disease)    Headache(784.0)    migraines   Heat rash    under the breasts.Aaron Aasappeared on monday.Aaron AasAaron AasBurning & itching, uses cortisone   Hypothyroidism    Iron deficiency anemia, unspecified    Lower esophageal ring 2008   EGD   Nonorganic sleep disorder, unspecified    Osteoarthritis    Osteoporosis    Pancreatitis    Personal history of colonic polyps    Polio 1948   Pulmonary sarcoidosis (HCC)    Sleep apnea    [facility]   Vitamin B12 deficiency    Vitamin D  deficiency    Past Surgical History:  Procedure Laterality Date   APPENDECTOMY  1988   CARPAL TUNNEL RELEASE  2002   rt   CHOLECYSTECTOMY  2000   COLONOSCOPY  12/19/2006   Multiple diminutive polyps destroyed-removed (hyperpastic), internal hemorrhoids   ESOPHAGOGASTRODUODENOSCOPY  12/19/2006   lower esophageal ring dilated   HAND SURGERY  1997   left   KNEE ARTHROSCOPY Bilateral    multiple 2 on lft 1 on rt   LUMBAR DISC SURGERY  1995   Melanoma Removal  Right 2018   right face   ROTATOR CUFF REPAIR Left    Nov 29, 2021   TOE SURGERY Left    left foot next to little toe  joint rem   TOTAL KNEE ARTHROPLASTY Right 05/09/2014   Procedure: RIGHT TOTAL KNEE ARTHROPLASTY;  Surgeon: Lorriane Rote, MD;  Location: [facility] OR;  Service: Orthopedics;  Laterality: Right;   TOTAL KNEE ARTHROPLASTY Left 08/06/2021   Procedure: TOTAL KNEE ARTHROPLASTY;  Surgeon: Winston Hawking, MD;  Location: [facility];  Service: Orthopedics;  Laterality: Left;   UPPER  GASTROINTESTINAL ENDOSCOPY     WISDOM TOOTH EXTRACTION     Patient Active Problem List   Diagnosis Date Noted   Preop exam for internal medicine 06/19/2023   Aortic atherosclerosis (HCC) 10/19/2022   Elevated coronary artery calcium score 10/19/2022   Edema 08/29/2022   Palpitations 01/24/2022   Abdominal hernia 11/24/2021   Anemia 11/24/2021   Asthma 11/24/2021   Decreased estrogen level 11/24/2021   Syncope 11/24/2021   Sphenoid sinusitis 11/24/2021   Rotator cuff tear 11/22/2021   Full thickness rotator cuff tear 09/30/2021   Insomnia 09/23/2020   Dyslipidemia 09/23/2020   RLS (restless legs syndrome) 07/20/2020   Drug side effects, initial encounter 07/20/2020   Coronary atherosclerosis 03/18/2019   Post-traumatic headache, not intractable 12/04/2018   Grief 09/12/2018   Atrophic vaginitis 09/06/2017   Burning sensation of mouth 09/06/2017   Osteopenia 09/05/2016   Upper respiratory infection 09/05/2016   Chest pain, atypical 03/04/2016   Headache 07/19/2013   Tinnitus 07/19/2013   Dry skin dermatitis 08/31/2012   Left hip pain 08/31/2012   Cerumen impaction 08/31/2012   Well adult exam 05/25/2012   Upper abdominal pain-chronic 04/02/2011   Thumb pain 03/11/2011   Shoulder pain 03/11/2011   Bilateral primary osteoarthritis of knee 09/10/2010   VERTIGO 03/05/2010  OSA on CPAP 07/22/2008   B12 deficiency 08/16/2007   Vitamin D  deficiency 08/16/2007   Nonorganic sleep disorder 05/10/2007   SARCOIDOSIS, PULMONARY 05/09/2007   Hypothyroidism 05/09/2007   Allergic rhinitis 05/09/2007   GERD 05/09/2007   History of colonic polyps 05/09/2007   REFERRING PROVIDER: Marionette Sick MD  REFERRING DIAG: S/p right RTC repair.  THERAPY DIAG:  Chronic right shoulder pain  Stiffness of right shoulder, not elsewhere classified  Localized edema  Rationale for Evaluation and Treatment: Rehabilitation  ONSET DATE: 08/28/23 (surgery date).  SUBJECTIVE:            Back  to MD 11-16-23 .                                                                                      SUBJECTIVE STATEMENT: RT shldr 3/10 still sore , did some laundry. I raised my arm all the way up this morning!                                                                              PERTINENT HISTORY: See above.  PAIN:  Are you having pain? Yes: NPRS scale: 2/10. Pain location: Right shoulder. Pain description: Sharp. Aggravating factors: Movement. Relieving factors: Medication.  PRECAUTIONS: Other: Progress per protocol.      WEIGHT BEARING RESTRICTIONS:  No right UE weight bearing.    FALLS:  Has patient fallen in last 6 months? No  LIVING ENVIRONMENT: Lives in: House/apartment Has following equipment at home: None  OCCUPATION: "Semi-Retired."  PLOF: Independent  PATIENT GOALS:Use right UE without pain.  NEXT MD VISIT:   OBJECTIVE:  Note: Objective measures were completed at Evaluation unless otherwise noted.  UPPER EXTREMITY ROM:   In supine:  Gentle right shoulder PROM to 75 degrees of flexion and ER to 10 degrees.  PALPATION:  Right shoulder incision cover with sterile gauze (patient's performed for her).  She c/o diffuse shoulder pain currently and her shoulder is visibly swollen.                                                                                                                               TREATMENT DATE: 11/07/23    8weeks   10-23-23  RT shldr Pulleys x 6 mins Standing ranger x 5 mins CW/CCW and flexion Standing  flexion x 10 Standing yellow tband Rows, IR, ER and ext , punches 2x10  Ball/wall x 10 Supine flexion and punches Manual:   PROM/ PAROM for  ABD, as well as ER with Pt in hook lying on mat and feet elevated    Vasopneumatic  x 15 mins RT shldr  on  low   PATIENT EDUCATION: Education details: See below (this exercise was part of her hospital discharge packet). Person educated: Patient Education method: Explanation,  Demonstration, Tactile cues, Verbal cues, and Handouts Education comprehension: verbalized understanding and returned demonstration  HOME EXERCISE PROGRAM: HOME EXERCISE PROGRAM Created by [place] Gaege Sangalang Jan 30th, 2025 View at [email or web address]-[email or web address] using code: [email or web address]  Page 1 of 1 1 Exercise WAND EXTERNAL ROTATION - SUPINE ER Lie on your back holding a cane or wand with both hands.  On the affected side, place a small rolled up towel or pillow under your elbow. Maintain approx. 90 degree bend at the elbow with your arm approximately 30-45 degrees away from your side. GENTLE. PAIN-FREE Use your other arm to pull the wand/cane to rotate the affected arm back into a stretch. Hold and then return to starting position and then repeat. Repeat 10 Times Hold 15 Seconds Complete 1 Set Perform 4 Times a Day  ASSESSMENT:  CLINICAL IMPRESSION: Pt arrived today reporting that she was able to finally raise her RT arm all the way up. Pt was able to continiue with AAROM, AROM as well as light strengthening exs with added punches today.AROM flexion to 140 degrees in standing today.   OBJECTIVE IMPAIRMENTS: decreased activity tolerance, decreased ROM, increased edema, and pain.   ACTIVITY LIMITATIONS: carrying, lifting, and reach over head  PARTICIPATION LIMITATIONS: meal prep, cleaning, and laundry  REHAB POTENTIAL: Excellent  CLINICAL DECISION MAKING: Stable/uncomplicated  EVALUATION COMPLEXITY: Low   GOALS:   Target date:  09/14/23.             PT LONG TERM GOAL #1    Title independent with a HEP.     Time 4     Period Weeks     Status Partilly met         PT LONG TERM GOAL #2    Title Active right  shoulder flexion to 145 degrees so the patient can easily reach overhead.     Time 4     Period Weeks     Status On going   120 degrees today         PT LONG TERM GOAL #3    Title Active ER to 70 degrees+ to allow for easily donning/doffing of apparel.     Time 4     Period  Weeks     Status On going   60 degrees         PT LONG TERM GOAL #4    Title Increase ROM so patient is able to reach behind back to L3.     Time 4     Period Weeks     Status On going         PT LONG TERM GOAL #5    Title Increase right shoulder strength to a solid 4+/5 to increase stability for performance of functional activities.     Time 4     Period Weeks     Status On going         PT LONG TERM GOAL #6    Title Perform ADL's with pain not > 2-3/10.  Time 6     Period Weeks     Status On going      PLAN:  PT FREQUENCY: 2x/week  PT DURATION: 6 months  PLANNED INTERVENTIONS: 97110-Therapeutic exercises, 97530- Therapeutic activity, [id number]- Neuromuscular re-education, 97535- Self Care, 96045- Manual therapy, 97014- Electrical stimulation (unattended), 97016- Vasopneumatic device, Cryotherapy, and Moist heat  PLAN FOR NEXT SESSION: AAROM and light strengthening next to body with yellow tband. vasopneumatic on low with pillow between thorax and elbow.   PHYSICAL THERAPY DISCHARGE SUMMARY  Visits from Start of Care: 19.  Current functional level related to goals / functional outcomes: See above.   Remaining deficits: ROM deficits but very good progress overall.   Education / Equipment: HEP.   Patient agrees to discharge. Patient goals were partially met. Patient is being discharged due to the physician's request.   Akelia Husted, [place], PT 11/29/2023, 11:27 AM

## 2023-12-14 DIAGNOSIS — G5602 Carpal tunnel syndrome, left upper limb: Secondary | ICD-10-CM | POA: Diagnosis not present

## 2023-12-14 DIAGNOSIS — M19042 Primary osteoarthritis, left hand: Secondary | ICD-10-CM | POA: Diagnosis not present

## 2023-12-14 DIAGNOSIS — G5603 Carpal tunnel syndrome, bilateral upper limbs: Secondary | ICD-10-CM | POA: Diagnosis not present

## 2023-12-15 DIAGNOSIS — G5601 Carpal tunnel syndrome, right upper limb: Secondary | ICD-10-CM | POA: Insufficient documentation

## 2023-12-18 ENCOUNTER — Encounter: Payer: Self-pay | Admitting: Internal Medicine

## 2023-12-18 ENCOUNTER — Ambulatory Visit (INDEPENDENT_AMBULATORY_CARE_PROVIDER_SITE_OTHER): Payer: Medicare Other | Admitting: Internal Medicine

## 2023-12-18 VITALS — BP 118/68 | HR 58 | Temp 98.3°F | Ht 64.0 in | Wt 171.0 lb

## 2023-12-18 DIAGNOSIS — E559 Vitamin D deficiency, unspecified: Secondary | ICD-10-CM

## 2023-12-18 DIAGNOSIS — C4330 Malignant melanoma of unspecified part of face: Secondary | ICD-10-CM | POA: Diagnosis not present

## 2023-12-18 DIAGNOSIS — E039 Hypothyroidism, unspecified: Secondary | ICD-10-CM

## 2023-12-18 DIAGNOSIS — C439 Malignant melanoma of skin, unspecified: Secondary | ICD-10-CM | POA: Insufficient documentation

## 2023-12-18 DIAGNOSIS — M25551 Pain in right hip: Secondary | ICD-10-CM | POA: Diagnosis not present

## 2023-12-18 DIAGNOSIS — E538 Deficiency of other specified B group vitamins: Secondary | ICD-10-CM | POA: Diagnosis not present

## 2023-12-18 LAB — COMPREHENSIVE METABOLIC PANEL WITH GFR
ALT: 19 U/L (ref 0–35)
AST: 21 U/L (ref 0–37)
Albumin: 4.2 g/dL (ref 3.5–5.2)
Alkaline Phosphatase: 101 U/L (ref 39–117)
BUN: 22 mg/dL (ref 6–23)
CO2: 31 meq/L (ref 19–32)
Calcium: 9.1 mg/dL (ref 8.4–10.5)
Chloride: 98 meq/L (ref 96–112)
Creatinine, Ser: 0.91 mg/dL (ref 0.40–1.20)
GFR: 57.7 mL/min — ABNORMAL LOW (ref >60.00)
Glucose, Bld: 107 mg/dL — ABNORMAL HIGH (ref 70–99)
Potassium: 3.9 meq/L (ref 3.5–5.1)
Sodium: 138 meq/L (ref 135–145)
Total Bilirubin: 0.3 mg/dL (ref 0.2–1.2)
Total Protein: 7.2 g/dL (ref 6.0–8.3)

## 2023-12-18 LAB — CBC WITH DIFFERENTIAL/PLATELET
Basophils Absolute: 0.1 10*3/uL (ref 0.0–0.1)
Basophils Relative: 0.8 % (ref 0.0–3.0)
Eosinophils Absolute: 0.4 10*3/uL (ref 0.0–0.7)
Eosinophils Relative: 3.8 % (ref 0.0–5.0)
HCT: 36.2 % (ref 36.0–46.0)
Hemoglobin: 11.7 g/dL — ABNORMAL LOW (ref 12.0–15.0)
Lymphocytes Relative: 30.3 % (ref 12.0–46.0)
Lymphs Abs: 3.5 10*3/uL (ref 0.7–4.0)
MCHC: 32.2 g/dL (ref 30.0–36.0)
MCV: 77.7 fl — ABNORMAL LOW (ref 78.0–100.0)
Monocytes Absolute: 1.2 10*3/uL — ABNORMAL HIGH (ref 0.1–1.0)
Monocytes Relative: 10.1 % (ref 3.0–12.0)
Neutro Abs: 6.3 10*3/uL (ref 1.4–7.7)
Neutrophils Relative %: 55 % (ref 43.0–77.0)
Platelets: 386 10*3/uL (ref 150.0–400.0)
RBC: 4.66 Mil/uL (ref 3.87–5.11)
RDW: 15.9 % — ABNORMAL HIGH (ref 11.5–15.5)
WBC: 11.4 10*3/uL — ABNORMAL HIGH (ref 4.0–10.5)

## 2023-12-18 MED ORDER — NITROGLYCERIN 0.4 MG SL SUBL: 0.4000 mg | SUBLINGUAL_TABLET | SUBLINGUAL | 3 refills | Status: AC | PRN

## 2023-12-18 NOTE — Assessment & Plan Note (Signed)
Chronic On Levothyroxine 

## 2023-12-18 NOTE — Assessment & Plan Note (Signed)
 R hip pain - seeing Dr Brunilda Capra; MRI is pending

## 2023-12-18 NOTE — Progress Notes (Signed)
 Subjective:  Patient ID: Elizabeth Gray, female    DOB: Dec 12, 1940  Age: 83 y.o. MRN: [id number]  CC: Medical Management of Chronic Issues (6 MNTH F/U)   HPI Elizabeth Gray presents for B12, hypothyroidism, recent melanoma R cheek bone - s/p surgery 5 wks ago C/o R hip pain - seeing Dr Brunilda Capra; MRI is pending  Outpatient Medications Prior to Visit  Medication Sig Dispense Refill   Alum Hydroxide-Mag Carbonate (GAVISCON PO) Take 1 tablet by mouth 3 (three) times daily after meals.     ASPIRIN  81 PO Take by mouth.     B-D 3CC LUER-LOK SYR 25GX1" 25G X 1" 3 ML MISC USE AS DIRECTED FOR  B12  INJECTIONS 12 each 0   Cholecalciferol  (VITAMIN D  PO) Take 1,000 Units by mouth every other day.      cyanocobalamin  (VITAMIN B12) 1000 MCG/ML injection INJECT 1 ML INTRAMUSCULARLY ONCE EVERY 14 DAYS 6 mL 11   Flaxseed, Linseed, (FLAXSEED OIL) 1000 MG CAPS Take 1,000 mg by mouth daily.     furosemide  (LASIX ) 20 MG tablet Take 20 mg by mouth daily.     HYDROcodone -acetaminophen  (NORCO/VICODIN) 5-325 MG tablet Take 1 tablet by mouth every 6 (six) hours as needed.     levothyroxine  (SYNTHROID ) 100 MCG tablet Take 1 tablet by mouth once daily 90 tablet 3   metoprolol  succinate (TOPROL -XL) 25 MG 24 hr tablet Take 1 tablet (25 mg total) by mouth daily. 90 tablet 3   Multiple Vitamins-Minerals (ICAPS AREDS FORMULA PO) Take 1 capsule by mouth in the morning and at bedtime.     omeprazole -sodium bicarbonate  (ZEGERID ) 40-1100 MG capsule Take 1 capsule by mouth daily before breakfast. 90 capsule 0   Polyethyl Glycol-Propyl Glycol 0.4-0.3 % SOLN Place 2-3 drops into both eyes 2 (two) times daily as needed (dry eyes).     pramipexole  (MIRAPEX ) 1 MG tablet TAKE 2 TABLETS BY MOUTH AT BEDTIME 180 tablet 0   pravastatin  (PRAVACHOL ) 20 MG tablet Take 1 tablet by mouth once daily 90 tablet 3   torsemide  (DEMADEX ) 20 MG tablet Take 1 tablet (20 mg total) by mouth daily as needed (Swelling). 30 tablet 3   traMADol   (ULTRAM ) 50 MG tablet Take 1-2 tablets (50-100 mg total) by mouth every 6 (six) hours as needed for moderate pain or severe pain. 40 tablet 0   nitroGLYCERIN  (NITROSTAT ) 0.4 MG SL tablet Place 1 tablet (0.4 mg total) under the tongue every 5 (five) minutes as needed for chest pain. 20 tablet 3   Glucosamine-Chondroitin (MOVE FREE PO) Take 1 tablet by mouth in the morning and at bedtime. (Patient not taking: Reported on 11/24/2023)     Incontinence Supply Disposable (UNDERPADS EXTRA LARGE) MISC As directed (Patient not taking: Reported on 12/18/2023) 108 each 11   No facility-administered medications prior to visit.    ROS: Review of Systems  Constitutional:  Negative for activity change, appetite change, chills, fatigue and unexpected weight change.  HENT:  Negative for congestion, mouth sores and sinus pressure.   Eyes:  Negative for visual disturbance.  Respiratory:  Negative for cough and chest tightness.   Gastrointestinal:  Negative for abdominal pain and nausea.  Genitourinary:  Negative for difficulty urinating, frequency and vaginal pain.  Musculoskeletal:  Positive for arthralgias. Negative for back pain and gait problem.  Skin:  Negative for pallor and rash.  Neurological:  Negative for dizziness, tremors, weakness, numbness and headaches.  Psychiatric/Behavioral:  Negative for confusion and sleep disturbance.  Objective:  BP 118/68   Pulse (!) 58   Temp 98.3 F (36.8 C) (Oral)   Ht 5\' 4"  (1.626 m)   Wt 171 lb (77.6 kg)   SpO2 98%   BMI 29.35 kg/m   BP Readings from Last 3 Encounters:  12/18/23 118/68  11/24/23 138/82  06/19/23 110/68    Wt Readings from Last 3 Encounters:  12/18/23 171 lb (77.6 kg)  11/24/23 178 lb 3.2 oz (80.8 kg)  06/19/23 177 lb (80.3 kg)    Physical Exam Constitutional:      General: She is not in acute distress.    Appearance: She is well-developed. She is obese.  HENT:     Head: Normocephalic.     Right Ear: External ear normal.      Left Ear: External ear normal.     Nose: Nose normal.  Eyes:     General:        Right eye: No discharge.        Left eye: No discharge.     Conjunctiva/sclera: Conjunctivae normal.     Pupils: Pupils are equal, round, and reactive to light.  Neck:     Thyroid : No thyromegaly.     Vascular: No JVD.     Trachea: No tracheal deviation.  Cardiovascular:     Rate and Rhythm: Normal rate and regular rhythm.     Heart sounds: Normal heart sounds.  Pulmonary:     Effort: No respiratory distress.     Breath sounds: No stridor. No wheezing.  Abdominal:     General: Bowel sounds are normal. There is no distension.     Palpations: Abdomen is soft. There is no mass.     Tenderness: There is no abdominal tenderness. There is no guarding or rebound.  Musculoskeletal:        General: No tenderness.     Cervical back: Normal range of motion and neck supple. No rigidity.     Right lower leg: No edema.     Left lower leg: No edema.  Lymphadenopathy:     Cervical: No cervical adenopathy.  Skin:    Findings: No erythema or rash.  Neurological:     Mental Status: She is oriented to person, place, and time.     Cranial Nerves: No cranial nerve deficit.     Motor: No abnormal muscle tone.     Coordination: Coordination normal.     Gait: Gait normal.     Deep Tendon Reflexes: Reflexes normal.  Psychiatric:        Behavior: Behavior normal.        Thought Content: Thought content normal.        Judgment: Judgment normal.   Post-op scar - R face  Lab Results  Component Value Date   WBC 8.6 06/19/2023   HGB 12.4 06/19/2023   HCT 38.1 06/19/2023   PLT 352.0 06/19/2023   GLUCOSE 104 (H) 06/19/2023   CHOL 120 06/19/2023   TRIG 87.0 06/19/2023   HDL 53.00 06/19/2023   LDLCALC 49 06/19/2023   ALT 14 06/19/2023   AST 21 06/19/2023   NA 139 06/19/2023   K 4.5 06/19/2023   CL 101 06/19/2023   CREATININE 0.94 06/19/2023   BUN 22 06/19/2023   CO2 30 06/19/2023   TSH 16.71 (H) 08/29/2022    INR 1.2 (H) 06/19/2023    MR BRAIN W WO CONTRAST Result Date: 01/26/2022 [facility] NEURO[facility] REPORT STUDY DATE: 01/26/22 PATIENT NAME: Devra Fontana T  Dupas DOB: Jun 26, 1941 MRN: [id number] ORDERING CLINICIAN: Omega Bible, MD CLINICAL HISTORY: 83 year old female with loss of consciousness. EXAM: MR BRAIN W WO CONTRAST TECHNIQUE: MRI of the brain with and without contrast was obtained utilizing  multiplanar, multiecho pulse sequences. CONTRAST: 13ml multihance  COMPARISON: 10/10/2014 MRI IMAGING SITE: [facility] AT [address] [place] FINDINGS: No abnormal lesions are seen on diffusion-weighted views to suggest acute ischemia. The cortical sulci, fissures and cisterns are normal in size and appearance. Lateral, third and fourth ventricle are normal in size and appearance. No extra-axial fluid collections are seen. No evidence of mass effect or midline shift.  No abnormal lesions are seen on post contrast views.  On sagittal views the posterior fossa, pituitary gland and corpus callosum are unremarkable. No evidence of intracranial hemorrhage on gradient-echo views. The orbits and their contents, paranasal sinuses and calvarium are notable for mucus thickening in the ethmoid sinuses.  Intracranial flow voids are present.   Unremarkable MRI brain with without contrast.  No acute findings. INTERPRETING PHYSICIAN: Omega Bible, MD Certified in Neurology, Neurophysiology and Neuroimaging [facility] [address], [address] [place], [place] [address] [phone]    Assessment & Plan:   Problem List Items Addressed This Visit     Hypothyroidism - Primary   Chronic  On Levothyroxine       Relevant Orders   CBC with Differential/Platelet   Comprehensive metabolic panel with GFR   TSH   T4, free   B12 deficiency   On B12      Relevant Orders   Vitamin B12   Vitamin D  deficiency   Relevant Orders   VITAMIN D  25  Hydroxy (Vit-D Deficiency, Fractures)   Hip pain   R hip pain - seeing Dr Brunilda Capra; MRI is pending      Melanoma (HCC)   Recent melanoma R cheek bone area - s/p surgery 5 wks ago      Relevant Orders   CBC with Differential/Platelet   Comprehensive metabolic panel with GFR   TSH      Meds ordered this encounter  Medications   nitroGLYCERIN  (NITROSTAT ) 0.4 MG SL tablet    Sig: Place 1 tablet (0.4 mg total) under the tongue every 5 (five) minutes as needed for chest pain.    Dispense:  20 tablet    Refill:  3      Follow-up: Return in about 6 months (around 06/19/2024) for a follow-up visit.  Anitra Barn, MD

## 2023-12-18 NOTE — Assessment & Plan Note (Signed)
 Recent melanoma R cheek bone area - s/p surgery 5 wks ago

## 2023-12-18 NOTE — Assessment & Plan Note (Signed)
 On B12

## 2023-12-19 LAB — VITAMIN D 25 HYDROXY (VIT D DEFICIENCY, FRACTURES): VITD: 34.37 ng/mL (ref 30.00–100.00)

## 2023-12-19 LAB — T4, FREE: Free T4: 0.99 ng/dL (ref 0.60–1.60)

## 2023-12-19 LAB — VITAMIN B12: Vitamin B-12: 222 pg/mL (ref 211–911)

## 2023-12-19 LAB — TSH: TSH: 15.58 u[IU]/mL — ABNORMAL HIGH (ref 0.35–5.50)

## 2023-12-22 ENCOUNTER — Ambulatory Visit: Payer: Self-pay | Admitting: Internal Medicine

## 2024-01-04 DIAGNOSIS — M25551 Pain in right hip: Secondary | ICD-10-CM | POA: Diagnosis not present

## 2024-01-05 ENCOUNTER — Encounter: Payer: Self-pay | Admitting: Internal Medicine

## 2024-01-06 ENCOUNTER — Other Ambulatory Visit: Payer: Self-pay | Admitting: Internal Medicine

## 2024-01-06 ENCOUNTER — Encounter: Payer: Self-pay | Admitting: Internal Medicine

## 2024-01-06 DIAGNOSIS — M8000XA Age-related osteoporosis with current pathological fracture, unspecified site, initial encounter for fracture: Secondary | ICD-10-CM

## 2024-01-06 DIAGNOSIS — M810 Age-related osteoporosis without current pathological fracture: Secondary | ICD-10-CM | POA: Insufficient documentation

## 2024-01-06 DIAGNOSIS — Z780 Asymptomatic menopausal state: Secondary | ICD-10-CM

## 2024-01-08 DIAGNOSIS — G5602 Carpal tunnel syndrome, left upper limb: Secondary | ICD-10-CM | POA: Diagnosis not present

## 2024-01-13 ENCOUNTER — Other Ambulatory Visit: Payer: Self-pay | Admitting: Internal Medicine

## 2024-01-23 ENCOUNTER — Telehealth: Payer: Self-pay | Admitting: Internal Medicine

## 2024-01-23 DIAGNOSIS — G5602 Carpal tunnel syndrome, left upper limb: Secondary | ICD-10-CM | POA: Diagnosis not present

## 2024-01-23 NOTE — Telephone Encounter (Signed)
 Copied from CRM [id number]. Topic: Referral - Question >> Jan 23, 2024  9:52 AM Lavanda D wrote: Reason for CRM: Rena with [facility] calling because patient is having a bone density scan on Friday that needs to be signed off on by Dr. Garald. She will be refaxing the order today  Phone#: [phone]   ---  [facility] and let them know that faxes are down but we will be on the look out for the orders.  TDW

## 2024-01-25 NOTE — Telephone Encounter (Signed)
 Okay. Thank you.

## 2024-01-26 ENCOUNTER — Encounter: Payer: Self-pay | Admitting: Internal Medicine

## 2024-01-26 ENCOUNTER — Ambulatory Visit (INDEPENDENT_AMBULATORY_CARE_PROVIDER_SITE_OTHER): Admitting: Internal Medicine

## 2024-01-26 VITALS — BP 102/62 | HR 60 | Ht 64.0 in | Wt 170.0 lb

## 2024-01-26 DIAGNOSIS — M8588 Other specified disorders of bone density and structure, other site: Secondary | ICD-10-CM | POA: Diagnosis not present

## 2024-01-26 DIAGNOSIS — K219 Gastro-esophageal reflux disease without esophagitis: Secondary | ICD-10-CM | POA: Diagnosis not present

## 2024-01-26 DIAGNOSIS — R1319 Other dysphagia: Secondary | ICD-10-CM | POA: Diagnosis not present

## 2024-01-26 DIAGNOSIS — Z8262 Family history of osteoporosis: Secondary | ICD-10-CM | POA: Diagnosis not present

## 2024-01-26 DIAGNOSIS — Z1231 Encounter for screening mammogram for malignant neoplasm of breast: Secondary | ICD-10-CM | POA: Diagnosis not present

## 2024-01-26 DIAGNOSIS — R29890 Loss of height: Secondary | ICD-10-CM | POA: Diagnosis not present

## 2024-01-26 DIAGNOSIS — K224 Dyskinesia of esophagus: Secondary | ICD-10-CM

## 2024-01-26 LAB — HM DEXA SCAN

## 2024-01-26 LAB — HM MAMMOGRAPHY

## 2024-01-26 NOTE — Progress Notes (Signed)
 Elizabeth Gray 83 y.o. May 08, 1941 [id number]  Assessment & Plan:   Encounter Diagnoses  Name Primary?   Esophageal dysphagia Yes   Esophageal dysmotility    Gastroesophageal reflux disease, unspecified whether esophagitis present    Continue PPI therapy and try to modify diet to minimize dysphagia.  The patient does not want to repeat EGD with dilation and I agree.  Return as needed or within 2 years if she continues to receive PPI through me.  She could receive through primary care.  Subjective:   Chief Complaint: Dysphagia, follow-up  HPI 83 year old woman with a history of suspected esophageal dysmotility and recurrent dysphagia and GERD who presents for follow-up.  Last seen EGD in 2023 as outlined below with other prior endoscopy procedures.  She has dysphagia once or twice a month typically with bread.  She manages that.  Someone once told her that taking a spoonful of mustard helps so she uses that as a remedy and believes that helps relieve the impaction.  She recently had carpal tunnel surgery on the left and is on a course of doxycycline.  She had a melanoma resected from the face also relatively recently.  Heartburn/reflux symptoms under control on generic Zegerid . EGD 06/17/2022 - Tortuous esophagus. Dilated. 20 mm balloon at distal esophagus and w/drawn retrograde to proximal esophagus - has helped in past for dysmotility dysphagia - Mucosal changes in the stomach. Biopsied. Looks like intestinal metaplasia in antrum, especially pre-pyloric + diffuse erythema. Pre-pyloric, antral lesser and greater curve + incisura, body + fundus lesser and greater curve biopsies (Mapping) - Normal examined duodenum  1. Surgical [P], pre-pyloric bx ACTIVE CHRONIC GASTRITIS WITH INTESTINAL METAPLASIA AND REACTIVE CHANGES. HELICOBACTER PYLORI IMMUNOHISTOCHEMICAL (IHC) STAIN NEGATIVE FOR ORGANISMS, WITH SATISFACTORY RESULTS. NEGATIVE FOR MALIGNANCY. 2. Surgical [P], gastric antrum  bx CHRONIC GASTRITIS WITH REACTIVE CHANGES. HELICOBACTER PYLORI IHC STAIN NEGATIVE FOR ORGANISMS, WITH SATISFACTORY CONTROLS. NEGATIVE FOR INTESTINAL METAPLASIA AND MALIGNANCY. 3. Surgical [P], fundus and gastric body bx CHRONIC GASTRITIS WITH FOCI OF INTESTINAL METAPLASIA. HELICOBACTER PYLORI IHC STAIN NEGATIVE FOR ORGANISMS, WITH SATISFACTORY CONTROLS. NEGATIVE FOR MALIGNANCY.   EGD 05/01/2018 Exam to antrum. - Tortuous esophagus. Dilated to 20 mm w/ balloon. - Erythematous mucosa in the antrum. - The examination was otherwise normal. - No specimens collected.  EGD 11/18/2016  - Tortuous esophagus. - One gastric polyp. Biopsied. - Discolored mucosa in the prepyloric region of the stomach. Biopsied. ? intestinal metaplasia - Small hiatal hernia. 1-2 cm - The examination was otherwise normal. - Dilation performed in the distal esophagus. at [employer] where ring was seen on Ba swallow   1. Surgical [P], gastric antrum - CHRONIC GASTRITIS IN A SETTING OF REACTIVE GASTROPATHY. - A WARTHIN-STARRY STAIN IS NEGATIVE FOR HELICOBACTER PYLORI. - POSITIVE FOR INTESTINAL METAPLASIA AND NEGATIVE FOR MALIGNANCY. 2. Surgical [P], gastric antrum, polyp - POLYPOID REACTIVE GASTROPATHY WITH FOCAL EROSION IN A SETTING OF CHRONIC AND FOCAL ACTIVE GASTRITIS. - A WARTHIN-STARRY STAIN IS NEGATIVE FOR HELICOBACTER PYLORI. - NEGATIVE FOR INTESTINAL METAPLASIA OR MALIGNANCY  Barium swallow 10/14/2016 Lower esophageal ring above a small transient hiatal hernia. The ring had a transient appearance, favored muscular. There was only mild delay in passage of a 13 mm barium tablet. Allergies  Allergen Reactions   Dexilant  [Dexlansoprazole ] Shortness Of Breath    set my stomach on fire and made me SOB   Metoclopramide  Hcl Dermatitis, Itching and Other (See Comments)    metoclopramide   metoclopramide  hydrochloride   Dayquil Severe + Vapocool [Phenylephrine -Dm-Gg-Apap]  Other (See Comments)    Hallucinations     Gabapentin      Music apraxia   Oxymetazoline Hcl Itching    AFRIN SPRAY   Restasis [Cyclosporine]     can't use it   Sucralfate Other (See Comments)    CARAFATE=Mouth sores   Acetaminophen  Other (See Comments)   Dm-Doxylamine-Acetaminophen  Other (See Comments)    Vivid/Bizarre Dreams    Vicks Dayquil Hbp Cold & Flu [Acetaminophen -Dm] Other (See Comments)    Hallucination/crazy dreams   Current Meds  Medication Sig   Alum Hydroxide-Mag Carbonate (GAVISCON PO) Take 1 tablet by mouth 3 (three) times daily after meals.   ASPIRIN  81 PO Take by mouth.   B-D 3CC LUER-LOK SYR 25GX1 25G X 1 3 ML MISC USE AS DIRECTED FOR  B12  INJECTIONS   Cholecalciferol  (VITAMIN D  PO) Take 1,000 Units by mouth every other day.    cyanocobalamin  (VITAMIN B12) 1000 MCG/ML injection INJECT 1 ML INTRAMUSCULARLY ONCE EVERY 14 DAYS   doxycycline (ADOXA) 100 MG tablet Take 100 mg by mouth 2 (two) times daily.   doxycycline (VIBRAMYCIN) 100 MG capsule Take 1 capsule twice a day by oral route for 7 days.   Flaxseed, Linseed, (FLAXSEED OIL) 1000 MG CAPS Take 1,000 mg by mouth daily.   Glucosamine-Chondroitin (MOVE FREE PO) Take 1 tablet by mouth in the morning and at bedtime.   levothyroxine  (SYNTHROID ) 100 MCG tablet Take 1 tablet by mouth once daily   metoprolol  succinate (TOPROL -XL) 25 MG 24 hr tablet Take 1 tablet (25 mg total) by mouth daily.   Multiple Vitamins-Minerals (ICAPS AREDS FORMULA PO) Take 1 capsule by mouth in the morning and at bedtime.   nitroGLYCERIN  (NITROSTAT ) 0.4 MG SL tablet Place 1 tablet (0.4 mg total) under the tongue every 5 (five) minutes as needed for chest pain.   omeprazole -sodium bicarbonate  (ZEGERID ) 40-1100 MG capsule Take 1 capsule by mouth daily before breakfast.   Polyethyl Glycol-Propyl Glycol 0.4-0.3 % SOLN Place 2-3 drops into both eyes 2 (two) times daily as needed (dry eyes).   pramipexole  (MIRAPEX ) 1 MG tablet TAKE 2 TABLETS BY MOUTH AT BEDTIME   pravastatin   (PRAVACHOL ) 20 MG tablet Take 1 tablet by mouth once daily   torsemide  (DEMADEX ) 20 MG tablet Take 1 tablet (20 mg total) by mouth daily as needed (Swelling).   traMADol  (ULTRAM ) 50 MG tablet Take 1-2 tablets (50-100 mg total) by mouth every 6 (six) hours as needed for moderate pain or severe pain.   Past Medical History:  Diagnosis Date   Allergy     Bursitis    Cancer (HCC) 03/22/2017   Melanoma in situ, lentigo maligna type   Deviated septum    External hemorrhoids without mention of complication 2008   Colonoscopy    Fatigue    GERD (gastroesophageal reflux disease)    Headache(784.0)    migraines   Heat rash    under the breasts.SABRAappeared on monday.[name], uses cortisone   Hypothyroidism    Iron deficiency anemia, unspecified    Lower esophageal ring 2008   EGD   Nonorganic sleep disorder, unspecified    Osteoarthritis    Osteoporosis    Pancreatitis    Personal history of colonic polyps    Polio 1948   Pulmonary sarcoidosis (HCC)    Sleep apnea    [facility]   Vitamin B12 deficiency    Vitamin D  deficiency    Past Surgical History:  Procedure  Laterality Date   APPENDECTOMY  1988   CARPAL TUNNEL RELEASE  2002   rt   CHOLECYSTECTOMY  2000   COLONOSCOPY  12/19/2006   Multiple diminutive polyps destroyed-removed (hyperpastic), internal hemorrhoids   ESOPHAGOGASTRODUODENOSCOPY  12/19/2006   lower esophageal ring dilated   HAND SURGERY  1997   left   KNEE ARTHROSCOPY Bilateral    multiple 2 on lft 1 on rt   LUMBAR DISC SURGERY  1995   Melanoma Removal  Right 2018   right face   ROTATOR CUFF REPAIR Left    Nov 29, 2021   TOE SURGERY Left    left foot next to little toe  joint rem   TOTAL KNEE ARTHROPLASTY Right 05/09/2014   Procedure: RIGHT TOTAL KNEE ARTHROPLASTY;  Surgeon: Elspeth JONELLE Her, MD;  Location: [facility];  Service: Orthopedics;  Laterality: Right;   TOTAL KNEE ARTHROPLASTY Left 08/06/2021   Procedure: TOTAL  KNEE ARTHROPLASTY;  Surgeon: Her Kemps, MD;  Location: [facility] ORS;  Service: Orthopedics;  Laterality: Left;   UPPER GASTROINTESTINAL ENDOSCOPY     WISDOM TOOTH EXTRACTION     Social History   Social History Narrative   Patient lives alone; feels safe in her home.  Patient is a widow.   Caffeine Use: none   She just retired on August 10, 2017- worked in a [employer] firm for 59 years.   Right handed   family history includes Brain cancer in her father; Breast cancer in her paternal aunt; Colon cancer in her maternal aunt; Esophageal cancer in her maternal grandfather; Migraines in her mother.   Review of Systems As per HPI  Objective:   Physical Exam BP 102/62   Pulse 60   Ht 5' 4 (1.626 m)   Wt 170 lb (77.1 kg)   BMI 29.18 kg/m

## 2024-01-26 NOTE — Patient Instructions (Addendum)
 Please modify your diet to avoid swallowing problems as we discussed. Be careful with the trigger foods like bread and chicken breast and cut small, chew well and drink liquid in between bites.  If things get significantly worse please let me know, otherwise see me/us  in 2 years.  I appreciate the opportunity to care for you. Lupita CHARLENA Commander, MD, NOLIA

## 2024-01-29 ENCOUNTER — Encounter: Payer: Self-pay | Admitting: Internal Medicine

## 2024-01-30 ENCOUNTER — Other Ambulatory Visit: Payer: Self-pay | Admitting: Internal Medicine

## 2024-02-05 DIAGNOSIS — H40013 Open angle with borderline findings, low risk, bilateral: Secondary | ICD-10-CM | POA: Diagnosis not present

## 2024-03-04 ENCOUNTER — Other Ambulatory Visit: Payer: Self-pay | Admitting: Internal Medicine

## 2024-03-06 ENCOUNTER — Other Ambulatory Visit: Payer: Self-pay | Admitting: Internal Medicine

## 2024-03-08 ENCOUNTER — Ambulatory Visit: Payer: Medicare Other

## 2024-03-08 VITALS — Ht 64.0 in | Wt 170.0 lb

## 2024-03-08 DIAGNOSIS — Z0000 Encounter for general adult medical examination without abnormal findings: Secondary | ICD-10-CM | POA: Diagnosis not present

## 2024-03-08 NOTE — Progress Notes (Signed)
 Subjective:   Elizabeth Gray is a 83 y.o. who presents for a Medicare Wellness preventive visit.  As a reminder, Annual Wellness Visits don't include a physical exam, and some assessments may be limited, especially if this visit is performed virtually. We may recommend an in-person follow-up visit with your provider if needed.  Visit Complete: Virtual I connected with  Elizabeth Gray on 03/08/24 by a audio enabled telemedicine application and verified that I am speaking with the correct person using two identifiers.  Patient Location: Home  Provider Location: Home Office  I discussed the limitations of evaluation and management by telemedicine. The patient expressed understanding and agreed to proceed.  Vital Signs: Because this visit was a virtual/telehealth visit, some criteria may be missing or patient reported. Any vitals not documented were not able to be obtained and vitals that have been documented are patient reported.  VideoDeclined- This patient declined Librarian, academic. Therefore the visit was completed with audio only.  Persons Participating in Visit: Patient.  AWV Questionnaire: Yes: Patient Medicare AWV questionnaire was completed by the patient on 03/07/2024; I have confirmed that all information answered by patient is correct and no changes since this date.  Cardiac Risk Factors include: advanced age (>44men, >3 women)     Objective:    Today's Vitals   03/08/24 0816  Weight: 170 lb (77.1 kg)  Height: 5' 4 (1.626 m)   Body mass index is 29.18 kg/m.     03/08/2024    8:25 AM 08/31/2023   12:06 PM 03/03/2023    8:33 AM 02/23/2022    2:40 PM 11/23/2021    9:31 AM 08/09/2021   12:33 PM 07/21/2021    9:24 AM  Advanced Directives  Does Patient Have a Medical Advance Directive? Yes Yes Yes Yes Yes Yes Yes  Type of Estate agent of [place];Living will  Healthcare Power of [place];Living will Healthcare Power  of [place];Living will Healthcare Power of [place];Living will  Healthcare Power of [place];Living will  Does patient want to make changes to medical advance directive? No - Patient declined  No - Patient declined No - Patient declined     Copy of Healthcare Power of Attorney in Chart? Yes - validated most recent copy scanned in chart (See row information)  Yes - validated most recent copy scanned in chart (See row information) No - copy requested No - copy requested      Current Medications (verified) Outpatient Encounter Medications as of 03/08/2024  Medication Sig   Alum Hydroxide-Mag Carbonate (GAVISCON PO) Take 1 tablet by mouth 3 (three) times daily after meals.   ASPIRIN  81 PO Take by mouth.   B-D 3CC LUER-LOK SYR 25GX1 25G X 1 3 ML MISC USE AS DIRECTED FOR  B12  INJECTIONS   Cholecalciferol  (VITAMIN D  PO) Take 1,000 Units by mouth every other day.    cyanocobalamin  (VITAMIN B12) 1000 MCG/ML injection INJECT 1 ML INTRAMUSCULARLY ONCE EVERY 14 DAYS.   doxycycline (ADOXA) 100 MG tablet Take 100 mg by mouth 2 (two) times daily.   Flaxseed, Linseed, (FLAXSEED OIL) 1000 MG CAPS Take 1,000 mg by mouth daily.   levothyroxine  (SYNTHROID ) 100 MCG tablet Take 1 tablet by mouth once daily   metoprolol  succinate (TOPROL -XL) 25 MG 24 hr tablet Take 1 tablet (25 mg total) by mouth daily.   Multiple Vitamins-Minerals (ICAPS AREDS FORMULA PO) Take 1 capsule by mouth in the morning and at bedtime.   nitroGLYCERIN  (NITROSTAT ) 0.4  MG SL tablet Place 1 tablet (0.4 mg total) under the tongue every 5 (five) minutes as needed for chest pain.   omeprazole -sodium bicarbonate  (ZEGERID ) 40-1100 MG capsule TAKE 1 CAPSULE BY MOUTH ONCE DAILY BEFORE BREAKFAST   Polyethyl Glycol-Propyl Glycol 0.4-0.3 % SOLN Place 2-3 drops into both eyes 2 (two) times daily as needed (dry eyes).   pramipexole  (MIRAPEX ) 1 MG tablet TAKE 2 TABLETS BY MOUTH AT BEDTIME   pravastatin  (PRAVACHOL ) 20 MG tablet Take 1 tablet by mouth once  daily   torsemide  (DEMADEX ) 20 MG tablet Take 1 tablet (20 mg total) by mouth daily as needed (Swelling).   doxycycline (VIBRAMYCIN) 100 MG capsule Take 1 capsule twice a day by oral route for 7 days.   Glucosamine-Chondroitin (MOVE FREE PO) Take 1 tablet by mouth in the morning and at bedtime.   traMADol  (ULTRAM ) 50 MG tablet Take 1-2 tablets (50-100 mg total) by mouth every 6 (six) hours as needed for moderate pain or severe pain.   No facility-administered encounter medications on file as of 03/08/2024.    Allergies (verified) Dexilant  [dexlansoprazole ], Metoclopramide  hcl, Dayquil severe + vapocool [phenylephrine -dm-gg-apap], Gabapentin , Oxymetazoline hcl, Restasis [cyclosporine], Sucralfate, Acetaminophen , Dm-doxylamine-acetaminophen , and Vicks dayquil hbp cold & flu [acetaminophen -dm]   History: Past Medical History:  Diagnosis Date   Allergy     Bursitis    Cancer (HCC) 03/22/2017   Melanoma in situ, lentigo maligna type   Deviated septum    External hemorrhoids without mention of complication 2008   Colonoscopy    Fatigue    GERD (gastroesophageal reflux disease)    Headache(784.0)    migraines   Heat rash    under the breasts.SABRAappeared on monday.[name], uses cortisone   Hypothyroidism    Iron deficiency anemia, unspecified    Lower esophageal ring 2008   EGD   Nonorganic sleep disorder, unspecified    Osteoarthritis    Osteoporosis    Pancreatitis    Personal history of colonic polyps    Polio 1948   Pulmonary sarcoidosis (HCC)    Sleep apnea    cpap [facility] 09 [facility]   Vitamin B12 deficiency    Vitamin D  deficiency    Past Surgical History:  Procedure Laterality Date   APPENDECTOMY  1988   CARPAL TUNNEL RELEASE  2002   rt   CARPAL TUNNEL RELEASE Left 11/23/2023   CHOLECYSTECTOMY  2000   COLONOSCOPY  12/19/2006   Multiple diminutive polyps destroyed-removed (hyperpastic), internal hemorrhoids   ESOPHAGOGASTRODUODENOSCOPY   12/19/2006   lower esophageal ring dilated   HAND SURGERY  1997   left   KNEE ARTHROSCOPY Bilateral    multiple 2 on lft 1 on rt   LUMBAR DISC SURGERY  1995   Melanoma Removal  Right 2018   right face   ROTATOR CUFF REPAIR Left    Nov 29, 2021   SHOULDER SURGERY Right 08/28/2023   shoulder repair   SKIN LESION EXCISION Right 11/13/2023   right cheek   TOE SURGERY Left    left foot next to little toe  joint rem   TOTAL KNEE ARTHROPLASTY Right 05/09/2014   Procedure: RIGHT TOTAL KNEE ARTHROPLASTY;  Surgeon: Elspeth JONELLE Her, MD;  Location: [facility];  Service: Orthopedics;  Laterality: Right;   TOTAL KNEE ARTHROPLASTY Left 08/06/2021   Procedure: TOTAL KNEE ARTHROPLASTY;  Surgeon: Her Kemps, MD;  Location: [facility] ORS;  Service: Orthopedics;  Laterality: Left;   UPPER GASTROINTESTINAL ENDOSCOPY     WISDOM  TOOTH EXTRACTION     Family History  Problem Relation Age of Onset   Brain cancer Father        brain tumor   Migraines Mother    Breast cancer Paternal Aunt    Colon cancer Maternal Aunt    Esophageal cancer Maternal Grandfather    Rectal cancer Neg Hx    Stomach cancer Neg Hx    Colon polyps Neg Hx    Social History   Socioeconomic History   Marital status: Widowed    Spouse name: Victory   Number of children: 0   Years of education: college   Highest education level: Associate degree: academic program  Occupational History   Occupation: Psychologist, forensic    Comment: works three days per week    Occupation: LEGAL ASSISTANT 3d/wk    Employer: [employer]  Tobacco Use   Smoking status: Former    Current packs/day: 0.00    Average packs/day: 1 pack/day for 17.0 years (17.0 ttl pk-yrs)    Types: Cigarettes    Start date: 08/01/1973    Quit date: 08/01/1990    Years since quitting: 33.6   Smokeless tobacco: Never  Vaping Use   Vaping status: Never Used  Substance and Sexual Activity   Alcohol  use: No    Alcohol /week: 0.0 standard drinks of alcohol    Drug use: No    Sexual activity: Never  Other Topics Concern   Not on file  Social History Narrative   Patient has a friend that lives with her temporary/2025 ; feels safe in her home.  Patient is a widow.   Caffeine Use: none   She just retired on August 10, 2017- worked in a Social worker firm for 59 years.   Right handed   Social Drivers of Health   Financial Resource Strain: Low Risk  (03/07/2024)   Overall Financial Resource Strain (CARDIA)    Difficulty of Paying Living Expenses: Not very hard  Food Insecurity: No Food Insecurity (03/07/2024)   Hunger Vital Sign    Worried About Running Out of Food in the Last Year: Never true    Ran Out of Food in the Last Year: Never true  Transportation Needs: No Transportation Needs (03/07/2024)   PRAPARE - Administrator, Civil Service (Medical): No    Lack of Transportation (Non-Medical): No  Physical Activity: Inactive (03/07/2024)   Exercise Vital Sign    Days of Exercise per Week: 0 days    Minutes of Exercise per Session: Not on file  Stress: Stress Concern Present (03/07/2024)   [employer] of Occupational Health - Occupational Stress Questionnaire    Feeling of Stress: To some extent  Social Connections: Moderately Integrated (03/07/2024)   Social Connection and Isolation Panel    Frequency of Communication with Friends and Family: Three times a week    Frequency of Social Gatherings with Friends and Family: Once a week    Attends Religious Services: More than 4 times per year    Active Member of [employer] or Organizations: Yes    Attends Banker Meetings: More than 4 times per year    Marital Status: Widowed    Tobacco Counseling Counseling given: Not Answered    Clinical Intake:  Pre-visit preparation completed: Yes  Pain : No/denies pain     BMI - recorded: 29.18 Nutritional Status: BMI 25 -29 Overweight Nutritional Risks: None  No results found for: HGBA1C   How often do you need to have someone  help you when you  read instructions, pamphlets, or other written materials from your doctor or pharmacy?: 1 - Never  Interpreter Needed?: No  Information entered by :: Lukus Binion, RMA   Activities of Daily Living     03/07/2024    6:51 PM  In your present state of health, do you have any difficulty performing the following activities:  Hearing? 1  Comment wears hearing aides  Vision? 0  Difficulty concentrating or making decisions? 0  Walking or climbing stairs? 1  Dressing or bathing? 0  Doing errands, shopping? 0  Preparing Food and eating ? N  Using the Toilet? N  In the past six months, have you accidently leaked urine? Y  Do you have problems with loss of bowel control? N  Managing your Medications? N  Managing your Finances? N  Housekeeping or managing your Housekeeping? N    Patient Care Team: Plotnikov, Karlynn GAILS, MD as PCP - General Lavona Agent, MD as PCP - Cardiology (Cardiology) Kay Kemps, MD as Consulting Physician (Orthopedic Surgery) Margaret Eduard SAUNDERS, MD as Consulting Physician (Neurology) Regenia, Prentice Clack, MD as Consulting Physician (Ophthalmology) Lavona Agent, MD as Consulting Physician (Cardiology) Jude Harden GAILS, MD as Consulting Physician (Pulmonary Disease)  I have updated your Care Teams any recent Medical Services you may have received from other providers in the past year.     Assessment:   This is a routine wellness examination for Durenda.  Hearing/Vision screen Hearing Screening - Comments:: Wears hearing aides Vision Screening - Comments:: Wears eyeglasses   Goals Addressed             This Visit's Progress    My goal is to maintain my health by staying independent, active and eating healthy.   On track    Still maintaining/2025       Depression Screen     03/08/2024    8:27 AM 12/18/2023    8:04 AM 06/19/2023    7:51 AM 03/03/2023    8:35 AM 11/28/2022    8:07 AM 08/29/2022    8:08 AM 05/24/2022    8:04 AM  PHQ 2/9 Scores   PHQ - 2 Score 3 0 0 0 0 0 0  PHQ- 9 Score 3   4  3 8     Fall Risk     03/07/2024    6:51 PM 12/18/2023    8:03 AM 06/19/2023    7:51 AM 03/03/2023    8:34 AM 03/02/2023   11:45 PM  Fall Risk   Falls in the past year? 0 0 0 1 1  Number falls in past yr: 0 0 0 0 0  Injury with Fall? 0 0 0  0  Risk for fall due to :  Impaired balance/gait No Fall Risks    Follow up Falls evaluation completed;Falls prevention discussed Falls evaluation completed Falls evaluation completed Falls prevention discussed;Falls evaluation completed     MEDICARE RISK AT HOME:  Medicare Risk at Home Any stairs in or around the home?: (Patient-Rptd) Yes If so, are there any without handrails?: (Patient-Rptd) No Home free of loose throw rugs in walkways, pet beds, electrical cords, etc?: (Patient-Rptd) Yes Adequate lighting in your home to reduce risk of falls?: (Patient-Rptd) Yes Life alert?: (Patient-Rptd) No Use of a cane, walker or w/c?: (Patient-Rptd) No Grab bars in the bathroom?: (Patient-Rptd) Yes Elevated toilet seat or a handicapped toilet?: (Patient-Rptd) Yes  TIMED UP AND GO:  Was the test performed?  No  Cognitive  Function: Declined/Normal: No cognitive concerns noted by patient or family. Patient alert, oriented, able to answer questions appropriately and recall recent events. No signs of memory loss or confusion.        03/03/2023   11:14 AM 02/23/2022    2:33 PM  6CIT Screen  What Year? 0 points 0 points  What month? 0 points 0 points  What time? 0 points 0 points  Count back from 20 0 points 0 points  Months in reverse 0 points 0 points  Repeat phrase 0 points 0 points  Total Score 0 points 0 points    Immunizations Immunization History  Administered Date(s) Administered   Fluad Quad(high Dose 65+) 05/03/2019, 05/11/2020, 05/21/2020, 04/17/2021   Influenza Split 08/31/2011, 05/25/2012   Influenza Whole 08/26/2009   Influenza, High Dose Seasonal PF 07/30/2018, 05/08/2022    Influenza,inj,Quad PF,6+ Mos 05/05/2015, 04/27/2016   Influenza,inj,quad, With Preservative 05/01/2020   Influenza-Unspecified 04/12/2013, 03/31/2014   Moderna Sars-Covid-2 Vaccination 09/05/2019, 10/03/2019, 06/07/2020, 05/09/2022   PFIZER SARS-COV-2 Pediatric Vaccination 5-52yrs 06/05/2020   PNEUMOCOCCAL CONJUGATE-20 11/28/2022   Pneumococcal Conjugate-13 07/19/2013   Pneumococcal Polysaccharide-23 03/06/2009, 09/06/2017   Respiratory Syncytial Virus Vaccine,Recomb Aduvanted(Arexvy) 11/28/2022   Td 03/05/2010   Td (Adult), 2 Lf Tetanus Toxid, Preservative Free 03/05/2010   Tdap 03/23/2020   Zoster Recombinant(Shingrix) 09/14/2018, 01/18/2019   Zoster, Live 03/17/2006    Screening Tests Health Maintenance  Topic Date Due   COVID-19 Vaccine (4 - 2024-25 season) 04/02/2023   INFLUENZA VACCINE  03/01/2024   Medicare Annual Wellness (AWV)  03/08/2025   DTaP/Tdap/Td (3 - Td or Tdap) 03/23/2030   Pneumococcal Vaccine: 50+ Years  Completed   DEXA SCAN  Completed   Zoster Vaccines- Shingrix  Completed   Hepatitis B Vaccines  Aged Out   HPV VACCINES  Aged Out   Meningococcal B Vaccine  Aged Out    Health Maintenance  Health Maintenance Due  Topic Date Due   COVID-19 Vaccine (4 - 2024-25 season) 04/02/2023   INFLUENZA VACCINE  03/01/2024   Health Maintenance Items Addressed: See Nurse Notes at the end of this note  Additional Screening:  Vision Screening: Recommended annual ophthalmology exams for early detection of glaucoma and other disorders of the eye. Would you like a referral to an eye doctor? No    Dental Screening: Recommended annual dental exams for proper oral hygiene  Community Resource Referral / Chronic Care Management: CRR required this visit?  No   CCM required this visit?  No   Plan:    I have personally reviewed and noted the following in the patient's chart:   Medical and social history Use of alcohol , tobacco or illicit drugs  Current medications  and supplements including opioid prescriptions. Patient is not currently taking opioid prescriptions. Functional ability and status Nutritional status Physical activity Advanced directives List of other physicians Hospitalizations, surgeries, and ER visits in previous 12 months Vitals Screenings to include cognitive, depression, and falls Referrals and appointments  In addition, I have reviewed and discussed with patient certain preventive protocols, quality metrics, and best practice recommendations. A written personalized care plan for preventive services as well as general preventive health recommendations were provided to patient.   Aletta Edmunds L Houa Ackert, CMA   03/08/2024   After Visit Summary: (MyChart) Due to this being a telephonic visit, the after visit summary with patients personalized plan was offered to patient via MyChart   Notes: Nothing significant to report at this time.

## 2024-03-08 NOTE — Patient Instructions (Signed)
 Elizabeth Gray , Thank you for taking time out of your busy schedule to complete your Annual Wellness Visit with me. I enjoyed our conversation and look forward to speaking with you again next year. I, as well as your care team,  appreciate your ongoing commitment to your health goals. Please review the following plan we discussed and let me know if I can assist you in the future. Your Game plan/ To Do List    Follow up Visits: We will see or speak with you next year for your Next Medicare AWV with our clinical staff Have you seen your provider in the last 6 months (3 months if uncontrolled diabetes)? Yes  Clinician Recommendations:  Aim for 30 minutes of exercise or brisk walking, 6-8 glasses of water , and 5 servings of fruits and vegetables each day. Keep up the good work.      This is a list of the screenings recommended for you:  Health Maintenance  Topic Date Due   COVID-19 Vaccine (4 - 2024-25 season) 04/02/2023   Medicare Annual Wellness Visit  03/02/2024   Flu Shot  03/01/2024   DTaP/Tdap/Td vaccine (3 - Td or Tdap) 03/23/2030   Pneumococcal Vaccine for age over 57  Completed   DEXA scan (bone density measurement)  Completed   Zoster (Shingles) Vaccine  Completed   Hepatitis B Vaccine  Aged Out   HPV Vaccine  Aged Out   Meningitis B Vaccine  Aged Out    Advanced directives: (In Chart) A copy of your advanced directives are scanned into your chart should your provider ever need it. Advance Care Planning is important because it:  [x]  Makes sure you receive the medical care that is consistent with your values, goals, and preferences  [x]  It provides guidance to your family and loved ones and reduces their decisional burden about whether or not they are making the right decisions based on your wishes.  Follow the link provided in your after visit summary or read over the paperwork we have mailed to you to help you started getting your Advance Directives in place. If you need  assistance in completing these, please reach out to us  so that we can help you!  See attachments for Preventive Care and Fall Prevention Tips.

## 2024-03-27 DIAGNOSIS — L905 Scar conditions and fibrosis of skin: Secondary | ICD-10-CM | POA: Diagnosis not present

## 2024-03-27 DIAGNOSIS — D225 Melanocytic nevi of trunk: Secondary | ICD-10-CM | POA: Diagnosis not present

## 2024-03-27 DIAGNOSIS — Z85820 Personal history of malignant melanoma of skin: Secondary | ICD-10-CM | POA: Diagnosis not present

## 2024-03-27 DIAGNOSIS — D224 Melanocytic nevi of scalp and neck: Secondary | ICD-10-CM | POA: Diagnosis not present

## 2024-03-27 DIAGNOSIS — L821 Other seborrheic keratosis: Secondary | ICD-10-CM | POA: Diagnosis not present

## 2024-03-30 ENCOUNTER — Other Ambulatory Visit: Payer: Self-pay | Admitting: Cardiology

## 2024-04-08 DIAGNOSIS — Z85828 Personal history of other malignant neoplasm of skin: Secondary | ICD-10-CM | POA: Diagnosis not present

## 2024-04-12 ENCOUNTER — Other Ambulatory Visit: Payer: Self-pay | Admitting: Internal Medicine

## 2024-04-25 ENCOUNTER — Other Ambulatory Visit: Payer: Self-pay | Admitting: Internal Medicine

## 2024-04-26 ENCOUNTER — Other Ambulatory Visit: Payer: Self-pay | Admitting: Internal Medicine

## 2024-05-02 DIAGNOSIS — Z23 Encounter for immunization: Secondary | ICD-10-CM | POA: Diagnosis not present

## 2024-05-13 DIAGNOSIS — M9904 Segmental and somatic dysfunction of sacral region: Secondary | ICD-10-CM | POA: Diagnosis not present

## 2024-05-13 DIAGNOSIS — M9905 Segmental and somatic dysfunction of pelvic region: Secondary | ICD-10-CM | POA: Diagnosis not present

## 2024-05-13 DIAGNOSIS — M9903 Segmental and somatic dysfunction of lumbar region: Secondary | ICD-10-CM | POA: Diagnosis not present

## 2024-05-13 DIAGNOSIS — M5431 Sciatica, right side: Secondary | ICD-10-CM | POA: Diagnosis not present

## 2024-05-15 DIAGNOSIS — M9903 Segmental and somatic dysfunction of lumbar region: Secondary | ICD-10-CM | POA: Diagnosis not present

## 2024-05-15 DIAGNOSIS — M5431 Sciatica, right side: Secondary | ICD-10-CM | POA: Diagnosis not present

## 2024-05-15 DIAGNOSIS — M9904 Segmental and somatic dysfunction of sacral region: Secondary | ICD-10-CM | POA: Diagnosis not present

## 2024-05-15 DIAGNOSIS — M9905 Segmental and somatic dysfunction of pelvic region: Secondary | ICD-10-CM | POA: Diagnosis not present

## 2024-05-20 ENCOUNTER — Ambulatory Visit: Payer: Self-pay | Admitting: Internal Medicine

## 2024-05-20 DIAGNOSIS — M9904 Segmental and somatic dysfunction of sacral region: Secondary | ICD-10-CM | POA: Diagnosis not present

## 2024-05-20 DIAGNOSIS — M5431 Sciatica, right side: Secondary | ICD-10-CM | POA: Diagnosis not present

## 2024-05-20 DIAGNOSIS — M9903 Segmental and somatic dysfunction of lumbar region: Secondary | ICD-10-CM | POA: Diagnosis not present

## 2024-05-20 DIAGNOSIS — M9905 Segmental and somatic dysfunction of pelvic region: Secondary | ICD-10-CM | POA: Diagnosis not present

## 2024-05-21 ENCOUNTER — Other Ambulatory Visit: Payer: Self-pay | Admitting: Cardiology

## 2024-05-23 DIAGNOSIS — M9904 Segmental and somatic dysfunction of sacral region: Secondary | ICD-10-CM | POA: Diagnosis not present

## 2024-05-23 DIAGNOSIS — M5431 Sciatica, right side: Secondary | ICD-10-CM | POA: Diagnosis not present

## 2024-05-23 DIAGNOSIS — M9903 Segmental and somatic dysfunction of lumbar region: Secondary | ICD-10-CM | POA: Diagnosis not present

## 2024-05-23 DIAGNOSIS — M9905 Segmental and somatic dysfunction of pelvic region: Secondary | ICD-10-CM | POA: Diagnosis not present

## 2024-05-30 DIAGNOSIS — M9904 Segmental and somatic dysfunction of sacral region: Secondary | ICD-10-CM | POA: Diagnosis not present

## 2024-05-30 DIAGNOSIS — M5431 Sciatica, right side: Secondary | ICD-10-CM | POA: Diagnosis not present

## 2024-05-30 DIAGNOSIS — M9903 Segmental and somatic dysfunction of lumbar region: Secondary | ICD-10-CM | POA: Diagnosis not present

## 2024-05-30 DIAGNOSIS — M9905 Segmental and somatic dysfunction of pelvic region: Secondary | ICD-10-CM | POA: Diagnosis not present

## 2024-05-31 ENCOUNTER — Other Ambulatory Visit: Payer: Self-pay | Admitting: Internal Medicine

## 2024-06-06 DIAGNOSIS — M9904 Segmental and somatic dysfunction of sacral region: Secondary | ICD-10-CM | POA: Diagnosis not present

## 2024-06-06 DIAGNOSIS — M9903 Segmental and somatic dysfunction of lumbar region: Secondary | ICD-10-CM | POA: Diagnosis not present

## 2024-06-06 DIAGNOSIS — M5431 Sciatica, right side: Secondary | ICD-10-CM | POA: Diagnosis not present

## 2024-06-06 DIAGNOSIS — M9905 Segmental and somatic dysfunction of pelvic region: Secondary | ICD-10-CM | POA: Diagnosis not present

## 2024-06-15 ENCOUNTER — Other Ambulatory Visit: Payer: Self-pay | Admitting: Internal Medicine

## 2024-06-19 ENCOUNTER — Ambulatory Visit (INDEPENDENT_AMBULATORY_CARE_PROVIDER_SITE_OTHER): Admitting: Internal Medicine

## 2024-06-19 ENCOUNTER — Other Ambulatory Visit: Payer: Self-pay

## 2024-06-19 ENCOUNTER — Telehealth: Payer: Self-pay

## 2024-06-19 ENCOUNTER — Other Ambulatory Visit (HOSPITAL_COMMUNITY): Payer: Self-pay

## 2024-06-19 ENCOUNTER — Encounter: Payer: Self-pay | Admitting: Internal Medicine

## 2024-06-19 VITALS — BP 124/78 | HR 80 | Temp 98.0°F | Ht 64.0 in | Wt 177.8 lb

## 2024-06-19 DIAGNOSIS — M810 Age-related osteoporosis without current pathological fracture: Secondary | ICD-10-CM

## 2024-06-19 DIAGNOSIS — G8929 Other chronic pain: Secondary | ICD-10-CM | POA: Diagnosis not present

## 2024-06-19 DIAGNOSIS — M545 Low back pain, unspecified: Secondary | ICD-10-CM

## 2024-06-19 DIAGNOSIS — F5101 Primary insomnia: Secondary | ICD-10-CM | POA: Diagnosis not present

## 2024-06-19 DIAGNOSIS — D649 Anemia, unspecified: Secondary | ICD-10-CM

## 2024-06-19 DIAGNOSIS — E559 Vitamin D deficiency, unspecified: Secondary | ICD-10-CM | POA: Diagnosis not present

## 2024-06-19 DIAGNOSIS — E538 Deficiency of other specified B group vitamins: Secondary | ICD-10-CM

## 2024-06-19 LAB — COMPREHENSIVE METABOLIC PANEL WITH GFR
ALT: 15 U/L (ref 0–35)
AST: 21 U/L (ref 0–37)
Albumin: 4.2 g/dL (ref 3.5–5.2)
Alkaline Phosphatase: 122 U/L — ABNORMAL HIGH (ref 39–117)
BUN: 22 mg/dL (ref 6–23)
CO2: 29 meq/L (ref 19–32)
Calcium: 9.2 mg/dL (ref 8.4–10.5)
Chloride: 101 meq/L (ref 96–112)
Creatinine, Ser: 0.88 mg/dL (ref 0.40–1.20)
GFR: 59.86 mL/min — ABNORMAL LOW (ref >60.00)
Glucose, Bld: 112 mg/dL — ABNORMAL HIGH (ref 70–99)
Potassium: 4.3 meq/L (ref 3.5–5.1)
Sodium: 139 meq/L (ref 135–145)
Total Bilirubin: 0.4 mg/dL (ref 0.2–1.2)
Total Protein: 7.4 g/dL (ref 6.0–8.3)

## 2024-06-19 LAB — CBC WITH DIFFERENTIAL/PLATELET
Basophils Absolute: 0.1 K/uL (ref 0.0–0.1)
Basophils Relative: 1.2 % (ref 0.0–3.0)
Eosinophils Absolute: 0.4 K/uL (ref 0.0–0.7)
Eosinophils Relative: 5 % (ref 0.0–5.0)
HCT: 30.8 % — ABNORMAL LOW (ref 36.0–46.0)
Hemoglobin: 10 g/dL — ABNORMAL LOW (ref 12.0–15.0)
Lymphocytes Relative: 29.6 % (ref 12.0–46.0)
Lymphs Abs: 2.3 K/uL (ref 0.7–4.0)
MCHC: 32.4 g/dL (ref 30.0–36.0)
MCV: 73.3 fl — ABNORMAL LOW (ref 78.0–100.0)
Monocytes Absolute: 0.8 K/uL (ref 0.1–1.0)
Monocytes Relative: 9.7 % (ref 3.0–12.0)
Neutro Abs: 4.2 K/uL (ref 1.4–7.7)
Neutrophils Relative %: 54.5 % (ref 43.0–77.0)
Platelets: 341 K/uL (ref 150.0–400.0)
RBC: 4.2 Mil/uL (ref 3.87–5.11)
RDW: 17.1 % — ABNORMAL HIGH (ref 11.5–15.5)
WBC: 7.8 K/uL (ref 4.0–10.5)

## 2024-06-19 LAB — MAGNESIUM: Magnesium: 2.6 mg/dL — ABNORMAL HIGH (ref 1.5–2.5)

## 2024-06-19 MED ORDER — DENOSUMAB 60 MG/ML ~~LOC~~ SOSY: 60.0000 mg | PREFILLED_SYRINGE | Freq: Once | SUBCUTANEOUS | Status: AC

## 2024-06-19 MED ORDER — TEMAZEPAM 30 MG PO CAPS: 30.0000 mg | ORAL_CAPSULE | Freq: Every evening | ORAL | 3 refills | Status: AC | PRN

## 2024-06-19 MED ORDER — TOLTERODINE TARTRATE 2 MG PO TABS: 2.0000 mg | ORAL_TABLET | Freq: Two times a day (BID) | ORAL | 5 refills | Status: AC

## 2024-06-19 NOTE — Assessment & Plan Note (Signed)
 Better  Off Tramadol 

## 2024-06-19 NOTE — Telephone Encounter (Signed)
 Pt ready for scheduling for PROLIA  on or after : 06/19/24  Option# 1: Buy/Bill (Office supplied medication)  Out-of-pocket cost due at time of clinic visit: $0  Number of injection/visits approved: ---  Primary: MEDICARE Prolia  co-insurance: 0% Admin fee co-insurance: 0%  Secondary: AETNA-MEDSUP Prolia  co-insurance:  Admin fee co-insurance:   Medical Benefit Details: Date Benefits were checked: 06/19/24 Deductible: $257 Met of $257 Required/ Coinsurance: 0%/ Admin Fee: 0%  Prior Auth: N/A PA# Expiration Date:   # of doses approved: ----------------------------------------------------------------------- Option# 2- Med Obtained from pharmacy:  Pharmacy benefit: Copay $--- (Paid to pharmacy) Admin Fee: --- (Pay at clinic)  Prior Auth: --- PA# Expiration Date:   # of doses approved:   If patient wants fill through the pharmacy benefit please send prescription to: ---, and include estimated need by date in rx notes. Pharmacy will ship medication directly to the office.  Patient NOT eligible for Prolia  Copay Card. Copay Card can make patient's cost as little as $25. Link to apply: [email or web address]://www.amgensupportplus.[email or web address]/copay  ** This summary of benefits is an estimation of the patient's out-of-pocket cost. Exact cost may very based on individual plan coverage.

## 2024-06-19 NOTE — Assessment & Plan Note (Signed)
 Chronic w/RLS D/c Zolpidem  - not helping any more Will try Restoril at hs   Potential benefits of a long term benzodiazepines  use as well as potential risks  and complications were explained to the patient and were aknowledged.  On Mirapex 

## 2024-06-19 NOTE — Assessment & Plan Note (Signed)
 On B12

## 2024-06-19 NOTE — Telephone Encounter (Signed)
 [name]

## 2024-06-19 NOTE — Telephone Encounter (Signed)
 Prolia  VOB initiated via [email or web address]  Next Prolia  inj DUE: NEW START

## 2024-06-19 NOTE — Assessment & Plan Note (Signed)
 On Vit D

## 2024-06-19 NOTE — Progress Notes (Signed)
 Subjective:  Patient ID: Elizabeth Gray, female    DOB: [birth year]  Age: 83 y.o. MRN: [id number]  CC: Medical Management of Chronic Issues (6 Month follow up)   HPI Elizabeth Gray presents for insomnia, dry mouth,OA, HTN, RLS, bone loss  Outpatient Medications Prior to Visit  Medication Sig Dispense Refill   Alum Hydroxide-Mag Carbonate (GAVISCON PO) Take 1 tablet by mouth 3 (three) times daily after meals.     ASPIRIN  81 PO Take by mouth.     B-D 3CC LUER-LOK SYR 25GX1 25G X 1 3 ML MISC USE AS DIRECTED FOR B12 INJECTIONS 12 each 0   Cholecalciferol  (VITAMIN D  PO) Take 1,000 Units by mouth every other day.      cyanocobalamin  (VITAMIN B12) 1000 MCG/ML injection INJECT 1 ML INTRAMUSCULARLY ONCE EVERY 14 DAYS 6 mL 3   Flaxseed, Linseed, (FLAXSEED OIL) 1000 MG CAPS Take 1,000 mg by mouth daily.     levothyroxine  (SYNTHROID ) 100 MCG tablet Take 1 tablet by mouth once daily 90 tablet 1   metoprolol  succinate (TOPROL -XL) 25 MG 24 hr tablet Take 1 tablet by mouth once daily 90 tablet 2   Multiple Vitamins-Minerals (ICAPS AREDS FORMULA PO) Take 1 capsule by mouth in the morning and at bedtime.     nitroGLYCERIN  (NITROSTAT ) 0.4 MG SL tablet Place 1 tablet (0.4 mg total) under the tongue every 5 (five) minutes as needed for chest pain. 20 tablet 3   omeprazole -sodium bicarbonate  (ZEGERID ) 40-1100 MG capsule TAKE 1 CAPSULE BY MOUTH ONCE DAILY BEFORE BREAKFAST 90 capsule 3   Polyethyl Glycol-Propyl Glycol 0.4-0.3 % SOLN Place 2-3 drops into both eyes 2 (two) times daily as needed (dry eyes).     pramipexole  (MIRAPEX ) 1 MG tablet TAKE 2 TABLETS BY MOUTH AT BEDTIME 180 tablet 0   pravastatin  (PRAVACHOL ) 20 MG tablet Take 1 tablet by mouth once daily 90 tablet 3   torsemide  (DEMADEX ) 20 MG tablet TAKE 1 TABLET BY MOUTH ONCE DAILY AS NEEDED FOR  SWELLING 30 tablet 5   doxycycline (ADOXA) 100 MG tablet Take 100 mg by mouth 2 (two) times daily.     doxycycline (VIBRAMYCIN) 100 MG capsule Take 1  capsule twice a day by oral route for 7 days.     Glucosamine-Chondroitin (MOVE FREE PO) Take 1 tablet by mouth in the morning and at bedtime.     traMADol  (ULTRAM ) 50 MG tablet Take 1-2 tablets (50-100 mg total) by mouth every 6 (six) hours as needed for moderate pain or severe pain. 40 tablet 0   No facility-administered medications prior to visit.    ROS: Review of Systems  Constitutional:  Positive for fatigue. Negative for activity change, appetite change, chills and unexpected weight change.  HENT:  Negative for congestion, mouth sores and sinus pressure.   Eyes:  Negative for visual disturbance.  Respiratory:  Negative for cough and chest tightness.   Gastrointestinal:  Negative for abdominal pain and nausea.  Genitourinary:  Negative for difficulty urinating, frequency and vaginal pain.  Musculoskeletal:  Positive for arthralgias. Negative for back pain and gait problem.  Skin:  Negative for pallor and rash.  Neurological:  Negative for dizziness, tremors, weakness, numbness and headaches.  Psychiatric/Behavioral:  Positive for sleep disturbance. Negative for confusion and suicidal ideas. The patient is not nervous/anxious.     Objective:  BP 124/78   Pulse 80   Temp 98 F (36.7 C)   Ht 5' 4 (1.626 m)   Wt 177 lb  12.8 oz (80.6 kg)   SpO2 98%   BMI 30.52 kg/m   BP Readings from Last 3 Encounters:  06/19/24 124/78  01/26/24 102/62  12/18/23 118/68    Wt Readings from Last 3 Encounters:  06/19/24 177 lb 12.8 oz (80.6 kg)  03/08/24 170 lb (77.1 kg)  01/26/24 170 lb (77.1 kg)    Physical Exam Constitutional:      General: She is not in acute distress.    Appearance: She is well-developed. She is obese.  HENT:     Head: Normocephalic.     Right Ear: External ear normal.     Left Ear: External ear normal.     Nose: Nose normal.  Eyes:     General:        Right eye: No discharge.        Left eye: No discharge.     Conjunctiva/sclera: Conjunctivae normal.      Pupils: Pupils are equal, round, and reactive to light.  Neck:     Thyroid : No thyromegaly.     Vascular: No JVD.     Trachea: No tracheal deviation.  Cardiovascular:     Rate and Rhythm: Normal rate and regular rhythm.     Heart sounds: Normal heart sounds.  Pulmonary:     Effort: No respiratory distress.     Breath sounds: No stridor. No wheezing.  Abdominal:     General: Bowel sounds are normal. There is no distension.     Palpations: Abdomen is soft. There is no mass.     Tenderness: There is no abdominal tenderness. There is no guarding or rebound.  Musculoskeletal:        General: No tenderness.     Cervical back: Normal range of motion and neck supple. No rigidity.  Lymphadenopathy:     Cervical: No cervical adenopathy.  Skin:    Findings: No erythema or rash.  Neurological:     Cranial Nerves: No cranial nerve deficit.     Motor: No abnormal muscle tone.     Coordination: Coordination normal.     Deep Tendon Reflexes: Reflexes normal.  Psychiatric:        Behavior: Behavior normal.        Thought Content: Thought content normal.        Judgment: Judgment normal.   No edema  Lab Results  Component Value Date   WBC 11.4 (H) 12/18/2023   HGB 11.7 (L) 12/18/2023   HCT 36.2 12/18/2023   PLT 386.0 12/18/2023   GLUCOSE 107 (H) 12/18/2023   CHOL 120 06/19/2023   TRIG 87.0 06/19/2023   HDL 53.00 06/19/2023   LDLCALC 49 06/19/2023   ALT 19 12/18/2023   AST 21 12/18/2023   NA 138 12/18/2023   K 3.9 12/18/2023   CL 98 12/18/2023   CREATININE 0.91 12/18/2023   BUN 22 12/18/2023   CO2 31 12/18/2023   TSH 15.58 (H) 12/18/2023   INR 1.2 (H) 06/19/2023    MR BRAIN W WO CONTRAST Result Date: 01/26/2022 [facility] NEUROIMAG[facility] REPORT STUDY DATE: 01/26/22 PATIENT NAME: MOON BUDDE DOB: [birth year] MRN: [id number] ORDERING CLINICIAN: Margaret Eduard SAUNDERS, MD CLINICAL HISTORY: 83 year old female with loss of consciousness. EXAM: MR BRAIN W WO CONTRAST  TECHNIQUE: MRI of the brain with and without contrast was obtained utilizing  multiplanar, multiecho pulse sequences. CONTRAST: 13ml multihance  COMPARISON: 10/10/2014 MRI IMAGING SITE: [facility] [address] [place] FINDINGS: No abnormal lesions are  seen on diffusion-weighted views to suggest acute ischemia. The cortical sulci, fissures and cisterns are normal in size and appearance. Lateral, third and fourth ventricle are normal in size and appearance. No extra-axial fluid collections are seen. No evidence of mass effect or midline shift.  No abnormal lesions are seen on post contrast views.  On sagittal views the posterior fossa, pituitary gland and corpus callosum are unremarkable. No evidence of intracranial hemorrhage on gradient-echo views. The orbits and their contents, paranasal sinuses and calvarium are notable for mucus thickening in the ethmoid sinuses.  Intracranial flow voids are present.   Unremarkable MRI brain with without contrast.  No acute findings. INTERPRETING PHYSICIAN: EDUARD FABIENE HANLON, MD Certified in Neurology, Neurophysiology and Neuroimaging [facility] [address], [address] [place], [place] [address] [phone]    Assessment & Plan:   Problem List Items Addressed This Visit     Anemia   Relevant Orders   CBC with Differential/Platelet   Iron, TIBC and Ferritin Panel   B12 deficiency - Primary   On B12      Insomnia   Chronic w/RLS D/c Zolpidem  - not helping any more Will try Restoril at hs   Potential benefits of a long term benzodiazepines  use as well as potential risks  and complications were explained to the patient and were aknowledged.  On Mirapex       Low back pain   Better  Off Tramadol       Osteoporosis   Discussed Prolia/Jubbonti vs other On Vit D Stay active       Relevant Orders   Comprehensive metabolic panel with GFR   Magnesium    Vitamin D  deficiency   On Vit D       Relevant Orders   Magnesium       Meds ordered this encounter  Medications   temazepam (RESTORIL) 30 MG capsule    Sig: Take 1 capsule (30 mg total) by mouth at bedtime as needed for sleep.    Dispense:  30 capsule    Refill:  3   tolterodine (DETROL) 2 MG tablet    Sig: Take 1 tablet (2 mg total) by mouth 2 (two) times daily.    Dispense:  60 tablet    Refill:  5      Follow-up: Return for a follow-up visit.  Marolyn Noel, MD

## 2024-06-19 NOTE — Assessment & Plan Note (Addendum)
 Discussed Prolia /Jubbonti vs other On Vit D Stay active

## 2024-06-20 DIAGNOSIS — M9904 Segmental and somatic dysfunction of sacral region: Secondary | ICD-10-CM | POA: Diagnosis not present

## 2024-06-20 DIAGNOSIS — M9905 Segmental and somatic dysfunction of pelvic region: Secondary | ICD-10-CM | POA: Diagnosis not present

## 2024-06-20 DIAGNOSIS — M5431 Sciatica, right side: Secondary | ICD-10-CM | POA: Diagnosis not present

## 2024-06-20 DIAGNOSIS — M9903 Segmental and somatic dysfunction of lumbar region: Secondary | ICD-10-CM | POA: Diagnosis not present

## 2024-06-20 LAB — IRON,TIBC AND FERRITIN PANEL
%SAT: 5 % — ABNORMAL LOW (ref 16–45)
Ferritin: 7 ng/mL — ABNORMAL LOW (ref 16–288)
Iron: 22 ug/dL — ABNORMAL LOW (ref 45–160)
TIBC: 476 ug/dL — ABNORMAL HIGH (ref 250–450)

## 2024-06-24 ENCOUNTER — Ambulatory Visit: Payer: Self-pay | Admitting: Internal Medicine

## 2024-06-25 ENCOUNTER — Encounter: Payer: Self-pay | Admitting: Internal Medicine

## 2024-06-25 ENCOUNTER — Telehealth: Payer: Self-pay | Admitting: Internal Medicine

## 2024-06-25 NOTE — Telephone Encounter (Signed)
 PT is requesting to speak with a nurse regarding her symptoms. She recently had lab work done with PCP and it stated she had low hemaglobin. Please advise.

## 2024-06-25 NOTE — Telephone Encounter (Signed)
 The pt was told by PCP that she needs a follow up with GI for low Hgb. Last lab at PCP in chart (Hgb 10).  Appt was rescheduled to 12/2 at 930 am with Camie.  She will follow PCP recommendations for now.

## 2024-06-26 DIAGNOSIS — M9904 Segmental and somatic dysfunction of sacral region: Secondary | ICD-10-CM | POA: Diagnosis not present

## 2024-06-26 DIAGNOSIS — M5431 Sciatica, right side: Secondary | ICD-10-CM | POA: Diagnosis not present

## 2024-06-26 DIAGNOSIS — M9905 Segmental and somatic dysfunction of pelvic region: Secondary | ICD-10-CM | POA: Diagnosis not present

## 2024-06-26 DIAGNOSIS — M9903 Segmental and somatic dysfunction of lumbar region: Secondary | ICD-10-CM | POA: Diagnosis not present

## 2024-07-02 ENCOUNTER — Ambulatory Visit: Admitting: Gastroenterology

## 2024-07-02 ENCOUNTER — Other Ambulatory Visit (INDEPENDENT_AMBULATORY_CARE_PROVIDER_SITE_OTHER)

## 2024-07-02 ENCOUNTER — Encounter: Payer: Self-pay | Admitting: Gastroenterology

## 2024-07-02 VITALS — BP 114/60 | HR 82 | Ht 64.0 in | Wt 178.0 lb

## 2024-07-02 DIAGNOSIS — Z8719 Personal history of other diseases of the digestive system: Secondary | ICD-10-CM

## 2024-07-02 DIAGNOSIS — K5900 Constipation, unspecified: Secondary | ICD-10-CM

## 2024-07-02 DIAGNOSIS — E669 Obesity, unspecified: Secondary | ICD-10-CM | POA: Diagnosis not present

## 2024-07-02 DIAGNOSIS — D509 Iron deficiency anemia, unspecified: Secondary | ICD-10-CM | POA: Diagnosis not present

## 2024-07-02 DIAGNOSIS — R5383 Other fatigue: Secondary | ICD-10-CM

## 2024-07-02 DIAGNOSIS — K219 Gastro-esophageal reflux disease without esophagitis: Secondary | ICD-10-CM

## 2024-07-02 DIAGNOSIS — R0609 Other forms of dyspnea: Secondary | ICD-10-CM

## 2024-07-02 DIAGNOSIS — Z6830 Body mass index (BMI) 30.0-30.9, adult: Secondary | ICD-10-CM

## 2024-07-02 LAB — BASIC METABOLIC PANEL WITH GFR
BUN: 20 mg/dL (ref 6–23)
CO2: 31 meq/L (ref 19–32)
Calcium: 9.4 mg/dL (ref 8.4–10.5)
Chloride: 99 meq/L (ref 96–112)
Creatinine, Ser: 0.87 mg/dL (ref 0.40–1.20)
GFR: 60.67 mL/min (ref >60.00)
Glucose, Bld: 106 mg/dL — ABNORMAL HIGH (ref 70–99)
Potassium: 3.9 meq/L (ref 3.5–5.1)
Sodium: 137 meq/L (ref 135–145)

## 2024-07-02 LAB — CBC WITH DIFFERENTIAL/PLATELET
Basophils Absolute: 0.1 K/uL (ref 0.0–0.1)
Basophils Relative: 0.9 % (ref 0.0–3.0)
Eosinophils Absolute: 0.3 K/uL (ref 0.0–0.7)
Eosinophils Relative: 3 % (ref 0.0–5.0)
HCT: 30.7 % — ABNORMAL LOW (ref 36.0–46.0)
Hemoglobin: 10 g/dL — ABNORMAL LOW (ref 12.0–15.0)
Lymphocytes Relative: 27.3 % (ref 12.0–46.0)
Lymphs Abs: 2.6 K/uL (ref 0.7–4.0)
MCHC: 32.7 g/dL (ref 30.0–36.0)
MCV: 72.2 fl — ABNORMAL LOW (ref 78.0–100.0)
Monocytes Absolute: 0.9 K/uL (ref 0.1–1.0)
Monocytes Relative: 9.9 % (ref 3.0–12.0)
Neutro Abs: 5.6 K/uL (ref 1.4–7.7)
Neutrophils Relative %: 58.9 % (ref 43.0–77.0)
Platelets: 354 K/uL (ref 150.0–400.0)
RBC: 4.26 Mil/uL (ref 3.87–5.11)
RDW: 16.7 % — ABNORMAL HIGH (ref 11.5–15.5)
WBC: 9.5 K/uL (ref 4.0–10.5)

## 2024-07-02 NOTE — Patient Instructions (Addendum)
 Your provider has requested that you go to the basement level for lab work before leaving today. Press B on the elevator. The lab is located at the first door on the left as you exit the elevator.  We have sent the following medications to your pharmacy for you to pick up at your convenience: Suprep   Start taking your ferrous sulfate  325 mg once daily - ( iron supplement)   You have been scheduled for a colonoscopy. Please follow written instructions given to you at your visit today.   If you use inhalers (even only as needed), please bring them with you on the day of your procedure.  DO NOT TAKE 7 DAYS PRIOR TO TEST- Trulicity (dulaglutide) Ozempic, Wegovy (semaglutide) Mounjaro, Zepbound (tirzepatide) Bydureon Bcise (exanatide extended release)  DO NOT TAKE 1 DAY PRIOR TO YOUR TEST Rybelsus (semaglutide) Adlyxin (lixisenatide) Victoza (liraglutide) Byetta (exanatide) ___________________________________________________________________________  Due to recent changes in healthcare laws, you may see the results of your imaging and laboratory studies on MyChart before your provider has had a chance to review them.  We understand that in some cases there may be results that are confusing or concerning to you. Not all laboratory results come back in the same time frame and the provider may be waiting for multiple results in order to interpret others.  Please give us  48 hours in order for your provider to thoroughly review all the results before contacting the office for clarification of your results.   _______________________________________________________  If your blood pressure at your visit was 140/90 or greater, please contact your primary care physician to follow up on this.  _______________________________________________________  If you are age 38 or older, your body mass index should be between 23-30. Your Body mass index is 30.55 kg/m. If this is out of the aforementioned  range listed, please consider follow up with your Primary Care Provider.  If you are age 58 or younger, your body mass index should be between 19-25. Your Body mass index is 30.55 kg/m. If this is out of the aformentioned range listed, please consider follow up with your Primary Care Provider.   ________________________________________________________  The [facility] providers would like to encourage you to use MYCHART to communicate with providers for non-urgent requests or questions.  Due to long hold times on the telephone, sending your provider a message by [facility] may be a faster and more efficient way to get a response.  Please allow 48 business hours for a response.  Please remember that this is for non-urgent requests.  _______________________________________________________  [facility] is using a team-based approach to care.  Your team is made up of your doctor and two to three APPS. Our APPS (Nurse Practitioners and Physician Assistants) work with your physician to ensure care continuity for you. They are fully qualified to address your health concerns and develop a treatment plan. They communicate directly with your gastroenterologist to care for you. Seeing the Advanced Practice Practitioners on your physician's team can help you by facilitating care more promptly, often allowing for earlier appointments, access to diagnostic testing, procedures, and other specialty referrals.   Thank you for choosing me and [facility]. Camie Furbish, PA-C

## 2024-07-02 NOTE — Progress Notes (Signed)
 Elizabeth Gray [id number] 10-03-1940   Chief Complaint: Anemia, fatigue, shortness of breath  Referring Provider: Garald Karlynn GAILS, MD Primary GI MD: Dr. Avram  HPI: Elizabeth Gray is a 83 y.o. female with past medical history of GERD, hemorrhoids, hypothyroidism, IDA, pancreatitis, colon polyps, pulmonary sarcoidosis, sleep apnea on CPAP, vitamin B12 deficiency, vitamin D  deficiency, osteoporosis, appendectomy, cholecystectomy who presents today for a complaint of anemia, fatigue, shortness of breath.    Last seen in office 01/26/2024 by Dr. Avram for follow-up of dysphagia.  History of suspected esophageal dysmotility and recurrent dysphagia referred.  Last EGD 2023 with a tortuous esophagus which was dilated, gastric mapping performed.  Plan at last visit was to continue PPI therapy and modify diet to minimize dysphagia.  Patient did not want to repeat EGD with dilation.  Patient called 06/25/2024, stated she was told by PCP that she needed to follow-up with GI due to low hemoglobin.  In June hemoglobin had dropped from 12.4 to 11.7.  Labs 06/19/2024: Hemoglobin 10, MCV 73.3, alk phos 122, GFR 59, otherwise normal CMP, low iron and ferritin.  Last visit with cardiology 11/24/2023 for evaluation of palpitations.  Previous workup for syncope which occurred 10/2021.  Denied any shortness of breath at that time.  No further syncope and no further workup indicated.  She was noted to have an elevated coronary calcium, 69th percentile for her age, with negative exercise treadmill test in 2024.  No further workup was indicated at that time and she was to follow-up as needed.  She is on Zegerid . Was advised by PCP to start OTC iron 325 mg twice daily.   Discussed the use of AI scribe software for clinical note transcription with the patient, who gave verbal consent to proceed.  History of Present Illness Elizabeth Gray is an 83 year old female who presents with fatigue  and shortness of breath.  Fatigue and dyspnea - Fatigue and shortness of breath have worsened recently. - Fatigue is associated with increased responsibilities at church, including organizing a concert. - History of insomnia contributes to fatigue. - Shortness of breath occurs with exertion, especially when walking uphill. - No chest pain, palpitations, or syncope. - No blood in stool or black stools. - Recent drop in hemoglobin levels noted in primary care labs.  Gastrointestinal symptoms - Burning epigastric pain consistent with gastroesophageal reflux disease (GERD). - Burning sensation had resolved but has recurred, possibly due to increased stress and dietary changes, including increased consumption of dill pickle flavored [employer] potato chips. - Upper endoscopy performed a couple of years ago showed gastritis, negative for H. pylori. - Gaviscon provides symptom relief. - No hematemesis, melena, or hematochezia.  Constipation - Constipation since discontinuing magnesium  supplements due to elevated magnesium  levels. - Currently uses Senokot as needed for bowel movements.  Weight gain - Gained approximately fifty pounds over the past three years. - Attributes weight gain to dietary habits following her husband's death six years ago.  Vertigo - Vertigo occurs particularly when lying in certain positions.  Peripheral edema - History of swelling in feet and legs. - Uses torsemide  primarily on weekends for edema.  Swallowing difficulties - Chronic swallowing difficulties without recent worsening.  Deconditioning and mobility limitations - Underwent three surgeries this year: shoulder rotator cuff repair, melanoma removal, and carpal tunnel surgery. - Reduced activity level due to surgeries and stress fractures in the pelvic area.   Previous GI Procedures/Imaging   EGD 06/17/2022 - Tortuous esophagus. Dilated.  20 mm balloon at distal esophagus and w/drawn retrograde to  proximal esophagus - has helped in past for dysmotility dysphagia  - Mucosal changes in the stomach. Biopsied. Looks like intestinal metaplasia in antrum, especially pre-pyloric + diffuse erythema. Pre-pyloric, antral lesser and greater curve + incisura, body + fundus lesser and greater curve biopsies (Mapping)  - Normal examined duodenum   1. Surgical [P], pre-pyloric bx ACTIVE CHRONIC GASTRITIS WITH INTESTINAL METAPLASIA AND REACTIVE CHANGES. HELICOBACTER PYLORI IMMUNOHISTOCHEMICAL (IHC) STAIN NEGATIVE FOR ORGANISMS, WITH SATISFACTORY RESULTS. NEGATIVE FOR MALIGNANCY. 2. Surgical [P], gastric antrum bx CHRONIC GASTRITIS WITH REACTIVE CHANGES. HELICOBACTER PYLORI IHC STAIN NEGATIVE FOR ORGANISMS, WITH SATISFACTORY CONTROLS. NEGATIVE FOR INTESTINAL METAPLASIA AND MALIGNANCY. 3. Surgical [P], fundus and gastric body bx CHRONIC GASTRITIS WITH FOCI OF INTESTINAL METAPLASIA. HELICOBACTER PYLORI IHC STAIN NEGATIVE FOR ORGANISMS, WITH SATISFACTORY CONTROLS. NEGATIVE FOR MALIGNANCY.   Colonoscopy 03/20/2018 - One 2 to 3 mm polyp in the ascending colon, removed with a cold biopsy forceps. Resected and retrieved.  - The examination was otherwise normal on direct and retroflexion views.  - Personal history of colonic polyps. - No recall due to age  EGD 05/01/2018 Exam to antrum.  - Tortuous esophagus. Dilated to 20 mm w/ balloon.  - Erythematous mucosa in the antrum.  - The examination was otherwise normal.  - No specimens collected.   EGD 11/18/2016 - Tortuous esophagus.  - One gastric polyp. Biopsied.  - Discolored mucosa in the prepyloric region of the stomach. Biopsied. ? intestinal metaplasia  - Small hiatal hernia. 1-2 cm  - The examination was otherwise normal.  - Dilation performed in the distal esophagus. at [employer] [facility] where ring was seen on Ba swallow   1. Surgical [P], gastric antrum - CHRONIC GASTRITIS IN A SETTING OF REACTIVE GASTROPATHY. - A WARTHIN-STARRY STAIN IS NEGATIVE  FOR HELICOBACTER PYLORI. - POSITIVE FOR INTESTINAL METAPLASIA AND NEGATIVE FOR MALIGNANCY. 2. Surgical [P], gastric antrum, polyp - POLYPOID REACTIVE GASTROPATHY WITH FOCAL EROSION IN A SETTING OF CHRONIC AND FOCAL ACTIVE GASTRITIS. - A WARTHIN-STARRY STAIN IS NEGATIVE FOR HELICOBACTER PYLORI. - NEGATIVE FOR INTESTINAL METAPLASIA OR MALIGNANCY   Barium swallow 10/14/2016 Lower esophageal ring above a small transient hiatal hernia. The ring had a transient appearance, favored muscular. There was only mild delay in passage of a 13 mm barium tablet.   Past Medical History:  Diagnosis Date   Allergy     Bursitis    Cancer (HCC) 03/22/2017   Melanoma in situ, lentigo maligna type   Deviated septum    External hemorrhoids without mention of complication 2008   Colonoscopy    Fatigue    GERD (gastroesophageal reflux disease)    Headache(784.0)    migraines   Heat rash    under the breasts.SABRAappeared on monday.[name], uses cortisone   Hypothyroidism    Iron deficiency anemia, unspecified    Lower esophageal ring 2008   EGD   Nonorganic sleep disorder, unspecified    Osteoarthritis    Osteoporosis    Pancreatitis    Personal history of colonic polyps    Polio 1948   Pulmonary sarcoidosis    Sleep apnea    [facility]   Vitamin B12 deficiency    Vitamin D  deficiency     Past Surgical History:  Procedure Laterality Date   APPENDECTOMY  1988   CARPAL TUNNEL RELEASE  2002   rt   CARPAL TUNNEL RELEASE Left 11/23/2023   CHOLECYSTECTOMY  2000  COLONOSCOPY  12/19/2006   Multiple diminutive polyps destroyed-removed (hyperpastic), internal hemorrhoids   ESOPHAGOGASTRODUODENOSCOPY  12/19/2006   lower esophageal ring dilated   HAND SURGERY  1997   left   KNEE ARTHROSCOPY Bilateral    multiple 2 on lft 1 on rt   LUMBAR DISC SURGERY  1995   Melanoma Removal  Right 2018   right face   ROTATOR CUFF REPAIR Left    Nov 29, 2021    SHOULDER SURGERY Right 08/28/2023   shoulder repair   SKIN LESION EXCISION Right 11/13/2023   right cheek   TOE SURGERY Left    left foot next to little toe  joint rem   TOTAL KNEE ARTHROPLASTY Right 05/09/2014   Procedure: RIGHT TOTAL KNEE ARTHROPLASTY;  Surgeon: Elspeth JONELLE Her, MD;  Location: [facility] OR;  Service: Orthopedics;  Laterality: Right;   TOTAL KNEE ARTHROPLASTY Left 08/06/2021   Procedure: TOTAL KNEE ARTHROPLASTY;  Surgeon: Her Kemps, MD;  Location: [facility] ORS;  Service: Orthopedics;  Laterality: Left;   UPPER GASTROINTESTINAL ENDOSCOPY     WISDOM TOOTH EXTRACTION      Current Outpatient Medications  Medication Sig Dispense Refill   Alum Hydroxide-Mag Carbonate (GAVISCON PO) Take 1 tablet by mouth 3 (three) times daily after meals.     ASPIRIN  81 PO Take by mouth.     B-D 3CC LUER-LOK SYR 25GX1 25G X 1 3 ML MISC USE AS DIRECTED FOR B12 INJECTIONS 12 each 0   Cholecalciferol  (VITAMIN D  PO) Take 1,000 Units by mouth every other day.      cyanocobalamin  (VITAMIN B12) 1000 MCG/ML injection INJECT 1 ML INTRAMUSCULARLY ONCE EVERY 14 DAYS 6 mL 3   Flaxseed, Linseed, (FLAXSEED OIL) 1000 MG CAPS Take 1,000 mg by mouth daily.     levothyroxine  (SYNTHROID ) 100 MCG tablet Take 1 tablet by mouth once daily 90 tablet 1   metoprolol  succinate (TOPROL -XL) 25 MG 24 hr tablet Take 1 tablet by mouth once daily 90 tablet 2   Multiple Vitamins-Minerals (ICAPS AREDS FORMULA PO) Take 1 capsule by mouth in the morning and at bedtime.     nitroGLYCERIN  (NITROSTAT ) 0.4 MG SL tablet Place 1 tablet (0.4 mg total) under the tongue every 5 (five) minutes as needed for chest pain. 20 tablet 3   omeprazole -sodium bicarbonate  (ZEGERID ) 40-1100 MG capsule TAKE 1 CAPSULE BY MOUTH ONCE DAILY BEFORE BREAKFAST 90 capsule 3   Polyethyl Glycol-Propyl Glycol 0.4-0.3 % SOLN Place 2-3 drops into both eyes 2 (two) times daily as needed (dry eyes).     pramipexole  (MIRAPEX ) 1 MG tablet TAKE 2 TABLETS BY MOUTH AT BEDTIME  180 tablet 0   pravastatin  (PRAVACHOL ) 20 MG tablet Take 1 tablet by mouth once daily 90 tablet 3   temazepam  (RESTORIL ) 30 MG capsule Take 1 capsule (30 mg total) by mouth at bedtime as needed for sleep. 30 capsule 3   tolterodine  (DETROL ) 2 MG tablet Take 1 tablet (2 mg total) by mouth 2 (two) times daily. 60 tablet 5   torsemide  (DEMADEX ) 20 MG tablet TAKE 1 TABLET BY MOUTH ONCE DAILY AS NEEDED FOR  SWELLING 30 tablet 5   Current Facility-Administered Medications  Medication Dose Route Frequency Provider Last Rate Last Admin   [START ON 07/03/2024] denosumab  (PROLIA ) injection 60 mg  60 mg Subcutaneous Once Plotnikov, Aleksei V, MD        Allergies as of 07/02/2024 - Review Complete 07/02/2024  Allergen Reaction Noted   Dexilant  [dexlansoprazole ] Shortness Of Breath 11/09/2016  Metoclopramide  hcl Dermatitis, Itching, and Other (See Comments) 01/24/2021   Dayquil severe + vapocool [phenylephrine -dm-gg-apap] Other (See Comments) 01/10/2022   Gabapentin   07/20/2020   Oxymetazoline hcl Itching    Restasis [cyclosporine]  11/09/2016   Sucralfate Other (See Comments)    Acetaminophen  Other (See Comments) 01/24/2022   Dm-doxylamine-acetaminophen  Other (See Comments) 01/10/2022   Vicks dayquil hbp cold & flu [acetaminophen -dm] Other (See Comments) 01/24/2022    Family History  Problem Relation Age of Onset   Migraines Mother    Brain cancer Father        brain tumor   Colon cancer Maternal Aunt    Breast cancer Paternal Aunt    Esophageal cancer Maternal Grandfather    Rectal cancer Neg Hx    Stomach cancer Neg Hx    Colon polyps Neg Hx    Pancreatic cancer Neg Hx     Social History   Tobacco Use   Smoking status: Former    Current packs/day: 0.00    Average packs/day: 1 pack/day for 17.0 years (17.0 ttl pk-yrs)    Types: Cigarettes    Start date: 08/01/1973    Quit date: 08/01/1990    Years since quitting: 33.9   Smokeless tobacco: Never  Vaping Use   Vaping status: Never  Used  Substance Use Topics   Alcohol  use: No    Alcohol /week: 0.0 standard drinks of alcohol    Drug use: No     Review of Systems:    Constitutional: No weight loss, fever, chills. Positive fatigue. Cardiovascular: Intermittent chest pain relieved by either nitroglycerin  or gaviscon Respiratory: SOB with exertion Gastrointestinal: See HPI and otherwise negative   Physical Exam:  Vital signs: BP 114/60   Pulse 82   Ht 5' 4 (1.626 m)   Wt 178 lb (80.7 kg)   SpO2 93%   BMI 30.55 kg/m   Constitutional: Pleasant, obese female, alert and cooperative Head:  Normocephalic and atraumatic.  Eyes: No scleral icterus. Respiratory: Respirations even and unlabored. Lungs clear to auscultation bilaterally.  No wheezes, crackles, or rhonchi.  Cardiovascular:  Regular rate and rhythm. No murmurs. No peripheral edema. Gastrointestinal:  Soft, nondistended, nontender. No rebound or guarding. Normal bowel sounds. No appreciable masses or hepatomegaly. Rectal:  Not performed.  Neurologic:  Alert and oriented x4;  grossly normal neurologically.  Skin:   Dry and intact without significant lesions or rashes. Psychiatric: Oriented to person, place and time. Demonstrates good judgement and reason without abnormal affect or behaviors.   RELEVANT LABS AND IMAGING: CBC    Component Value Date/Time   WBC 7.8 06/19/2024 0834   RBC 4.20 06/19/2024 0834   HGB 10.0 (L) 06/19/2024 0834   HCT 30.8 (L) 06/19/2024 0834   PLT 341.0 06/19/2024 0834   MCV 73.3 (L) 06/19/2024 0834   MCH 27.5 11/23/2021 1019   MCHC 32.4 06/19/2024 0834   RDW 17.1 (H) 06/19/2024 0834   LYMPHSABS 2.3 06/19/2024 0834   MONOABS 0.8 06/19/2024 0834   EOSABS 0.4 06/19/2024 0834   BASOSABS 0.1 06/19/2024 0834    CMP     Component Value Date/Time   NA 139 06/19/2024 0834   K 4.3 06/19/2024 0834   CL 101 06/19/2024 0834   CO2 29 06/19/2024 0834   GLUCOSE 112 (H) 06/19/2024 0834   BUN 22 06/19/2024 0834   CREATININE 0.88  06/19/2024 0834   CREATININE 0.72 03/23/2020 0834   CALCIUM 9.2 06/19/2024 0834   PROT 7.4 06/19/2024 0834   ALBUMIN 4.2 06/19/2024  0834   AST 21 06/19/2024 0834   ALT 15 06/19/2024 0834   ALKPHOS 122 (H) 06/19/2024 0834   BILITOT 0.4 06/19/2024 0834   GFRNONAA 59 (L) 11/23/2021 1019   GFRNONAA 79 03/23/2020 0834   GFRAA 91 03/23/2020 0834   Echocardiogram 09/19/2022 1. Left ventricular ejection fraction, by estimation, is 70 to 75% . The left ventricle has hyperdynamic function. The left ventricle has no regional wall motion abnormalities. Left ventricular diastolic parameters were normal.  2. Right ventricular systolic function is normal. The right ventricular size is normal. There is normal pulmonary artery systolic pressure.  3. The mitral valve is normal in structure. Trivial mitral valve regurgitation.  4. The aortic valve is tricuspid. Aortic valve regurgitation is not visualized.  5. The inferior vena cava is normal in size with greater than 50% respiratory variability, suggesting right atrial pressure of 3 mmHg.  Assessment/Plan:   Assessment & Plan Iron deficiency anemia Recent hemoglobin drop from 12.4 to 11.7 to 10.0 with fatigue and shortness of breath.  Was advised to take iron supplement, has not been taking.  No overt GI bleeding.  Last EGD 2023, last colonoscopy 2019.  Case and plan discussed with Dr. Avram in office today.   - Labs today: CBC, BMP - Schedule EGD/colonoscopy. I thoroughly discussed the procedure with the patient to include nature of the procedure, alternatives, benefits, and risks (including but not limited to bleeding, infection, perforation, anesthesia/cardiac/pulmonary complications). Patient verbalized understanding and gave verbal consent to proceed with procedure. - Start ferrous sulfate  OTC, 325 mg daily  Gastroesophageal reflux disease with history of gastritis Chronic GERD with gastritis, negative for H. pylori. Recent increase in reflux  symptoms, possibly dietary-related. Taking Zegerid  daily and Gaviscon prn. No worsening of chronic dysphagia. Has been having some epigastric burning.  - Evaluate with EGD - Continue present medications  Constipation Recent constipation after stopping magnesium  supplements. Managed effectively with Senokot.  - Continue Senokot as needed for constipation management.  Obesity Significant weight gain over three years, contributing to shortness of breath and deconditioning. Attributed to dietary habits and lifestyle changes.    Camie Furbish, PA-C [facility] 07/02/2024, 9:13 AM  Patient Care Team: Garald Karlynn GAILS, MD as PCP - General Lavona Agent, MD as PCP - Cardiology (Cardiology) Kay Kemps, MD as Consulting Physician (Orthopedic Surgery) Margaret Eduard SAUNDERS, MD as Consulting Physician (Neurology) Mincey, Prentice Clack, MD as Consulting Physician (Ophthalmology) Lavona Agent, MD as Consulting Physician (Cardiology) Jude Harden GAILS, MD as Consulting Physician (Pulmonary Disease)

## 2024-07-03 ENCOUNTER — Ambulatory Visit: Payer: Self-pay | Admitting: Gastroenterology

## 2024-07-08 ENCOUNTER — Telehealth: Payer: Self-pay | Admitting: Gastroenterology

## 2024-07-08 DIAGNOSIS — M9904 Segmental and somatic dysfunction of sacral region: Secondary | ICD-10-CM | POA: Diagnosis not present

## 2024-07-08 DIAGNOSIS — M5431 Sciatica, right side: Secondary | ICD-10-CM | POA: Diagnosis not present

## 2024-07-08 DIAGNOSIS — M9905 Segmental and somatic dysfunction of pelvic region: Secondary | ICD-10-CM | POA: Diagnosis not present

## 2024-07-08 DIAGNOSIS — M9903 Segmental and somatic dysfunction of lumbar region: Secondary | ICD-10-CM | POA: Diagnosis not present

## 2024-07-08 MED ORDER — NA SULFATE-K SULFATE-MG SULF 17.5-3.13-1.6 GM/177ML PO SOLN: 1.0000 | ORAL | 0 refills | Status: AC

## 2024-07-08 NOTE — Telephone Encounter (Signed)
Suprep resent to pharmacy °

## 2024-07-08 NOTE — Telephone Encounter (Signed)
 Patient stated prep medication has not been called into pharmacy. Please advise, thank you

## 2024-07-11 ENCOUNTER — Encounter: Payer: Self-pay | Admitting: Internal Medicine

## 2024-07-11 ENCOUNTER — Ambulatory Visit: Admitting: Internal Medicine

## 2024-07-11 VITALS — BP 114/57 | HR 90 | Temp 97.0°F | Resp 15 | Ht 64.0 in | Wt 178.0 lb

## 2024-07-11 DIAGNOSIS — D509 Iron deficiency anemia, unspecified: Secondary | ICD-10-CM | POA: Diagnosis not present

## 2024-07-11 DIAGNOSIS — K31A12 Gastric intestinal metaplasia without dysplasia, involving the body (corpus): Secondary | ICD-10-CM | POA: Diagnosis not present

## 2024-07-11 DIAGNOSIS — K31A19 Gastric intestinal metaplasia without dysplasia, unspecified site: Secondary | ICD-10-CM | POA: Diagnosis not present

## 2024-07-11 DIAGNOSIS — K2940 Chronic atrophic gastritis without bleeding: Secondary | ICD-10-CM | POA: Diagnosis not present

## 2024-07-11 DIAGNOSIS — R1319 Other dysphagia: Secondary | ICD-10-CM

## 2024-07-11 DIAGNOSIS — K2950 Unspecified chronic gastritis without bleeding: Secondary | ICD-10-CM | POA: Diagnosis not present

## 2024-07-11 DIAGNOSIS — Q399 Congenital malformation of esophagus, unspecified: Secondary | ICD-10-CM | POA: Diagnosis not present

## 2024-07-11 DIAGNOSIS — R1310 Dysphagia, unspecified: Secondary | ICD-10-CM | POA: Diagnosis not present

## 2024-07-11 MED ORDER — SODIUM CHLORIDE 0.9 % IV SOLN: 500.0000 mL | INTRAVENOUS | Status: DC

## 2024-07-11 NOTE — Patient Instructions (Addendum)
 Please read handouts provided. Continue present medications. Dilation Diet. Await pathology results.   YOU HAD AN ENDOSCOPIC PROCEDURE TODAY AT THE [facility]:   Refer to the procedure report that was given to you for any specific questions about what was found during the examination.  If the procedure report does not answer your questions, please call your gastroenterologist to clarify.  If you requested that your care partner not be given the details of your procedure findings, then the procedure report has been included in a sealed envelope for you to review at your convenience later.  YOU SHOULD EXPECT: Some feelings of bloating in the abdomen. Passage of more gas than usual.  Walking can help get rid of the air that was put into your GI tract during the procedure and reduce the bloating. If you had a lower endoscopy (such as a colonoscopy or flexible sigmoidoscopy) you may notice spotting of blood in your stool or on the toilet paper. If you underwent a bowel prep for your procedure, you may not have a normal bowel movement for a few days.  Please Note:  You might notice some irritation and congestion in your nose or some drainage.  This is from the oxygen used during your procedure.  There is no need for concern and it should clear up in a day or so.  SYMPTOMS TO REPORT IMMEDIATELY:  Following lower endoscopy (colonoscopy or flexible sigmoidoscopy):  Excessive amounts of blood in the stool  Significant tenderness or worsening of abdominal pains  Swelling of the abdomen that is new, acute  Fever of 100F or higher  Following upper endoscopy (EGD)  Vomiting of blood or coffee ground material  New chest pain or pain under the shoulder blades  Painful or persistently difficult swallowing  New shortness of breath  Fever of 100F or higher  Black, tarry-looking stools  For urgent or emergent issues, a gastroenterologist can be reached at any hour by calling [phone]  [phone]. Do not use MyChart messaging for urgent concerns.    DIET:   Drink plenty of fluids but you should avoid alcoholic beverages for 24 hours.  ACTIVITY:  You should plan to take it easy for the rest of today and you should NOT DRIVE or use heavy machinery until tomorrow (because of the sedation medicines used during the test).    FOLLOW UP: Our staff will call the number listed on your records the next business day following your procedure.  We will call around 7:15- 8:00 am to check on you and address any questions or concerns that you may have regarding the information given to you following your procedure. If we do not reach you, we will leave a message.     If any biopsies were taken you will be contacted by phone or by letter within the next 1-3 weeks.  Please call us  at [phone] if you have not heard about the biopsies in 3 weeks.    SIGNATURES/CONFIDENTIALITY: You and/or your care partner have signed paperwork which will be entered into your electronic medical record.  These signatures attest to the fact that that the information above on your After Visit Summary has been reviewed and is understood.  Full responsibility of the confidentiality of this discharge information lies with you and/or your care-partner.I did not find any areas where you were losing blood.  The colonoscopy looked normal.  Upper endoscopy did show some changes of inflammation as you have had in the past I biopsied that  again and I dilated the esophagus to try to help you swallow better.  Overall this is good news.  I will notify you about the pathology results and any other treatment recommendations.  Continue your current medications.  I appreciate the opportunity to care for you. Lupita CHARLENA Commander, MD, [facility]

## 2024-07-11 NOTE — Op Note (Signed)
 [facility] Patient Name: Elizabeth Gray Procedure Date: 07/11/2024 1:54 PM MRN: [id number] Endoscopist: Lupita FORBES Commander , MD, [id number] Age: 83 Referring MD:  Date of Birth: 09-19-1940 Gender: Female Account #: [id number] Procedure:                Colonoscopy Indications:              Iron deficiency anemia Medicines:                Monitored Anesthesia Care Procedure:                Pre-Anesthesia Assessment:                           - Prior to the procedure, a History and Physical                            was performed, and patient medications and                            allergies were reviewed. The patient's tolerance of                            previous anesthesia was also reviewed. The risks                            and benefits of the procedure and the sedation                            options and risks were discussed with the patient.                            All questions were answered, and informed consent                            was obtained. Prior Anticoagulants: The patient has                            taken no anticoagulant or antiplatelet agents. ASA                            Grade Assessment: III - A patient with severe                            systemic disease. After reviewing the risks and                            benefits, the patient was deemed in satisfactory                            condition to undergo the procedure.                           After obtaining informed consent, the colonoscope  was passed under direct vision. Throughout the                            procedure, the patient's blood pressure, pulse, and                            oxygen saturations were monitored continuously. The                            CF [id number] #[id number] was introduced through the anus                            and advanced to the the cecum, identified by                            appendiceal orifice and  ileocecal valve. The                            colonoscopy was performed without difficulty. The                            patient tolerated the procedure well. The quality                            of the bowel preparation was good. The ileocecal                            valve, appendiceal orifice, and rectum were                            photographed. The bowel preparation used was SUPREP                            via split dose instruction. Scope In: 2:25:24 PM Scope Out: 2:40:37 PM Scope Withdrawal Time: 0 hours 10 minutes 32 seconds  Total Procedure Duration: 0 hours 15 minutes 13 seconds  Findings:                 The perianal and digital rectal examinations were                            normal.                           The entire examined colon appeared normal on direct                            and retroflexion views. Complications:            No immediate complications. Estimated Blood Loss:     Estimated blood loss: none. Impression:               - The entire examined colon is normal on direct and  retroflexion views.                           - No specimens collected. Recommendation:           - Patient has a contact number available for                            emergencies. The signs and symptoms of potential                            delayed complications were discussed with the                            patient. Return to normal activities tomorrow.                            Written discharge instructions were provided to the                            patient.                           - Continue present medications.                           - Clear liquids x 1 hour then soft foods rest of                            day. Start prior diet tomorrow. Lupita FORBES Commander, MD 07/11/2024 2:55:50 PM This report has been signed electronically.

## 2024-07-11 NOTE — Progress Notes (Signed)
 Pt's states no medical or surgical changes since previsit or office visit.

## 2024-07-11 NOTE — Progress Notes (Signed)
 Called to room to assist during endoscopic procedure.  Patient ID and intended procedure confirmed with present staff. Received instructions for my participation in the procedure from the performing physician.

## 2024-07-11 NOTE — Op Note (Signed)
 [facility] Patient Name: Elizabeth Gray Procedure Date: 07/11/2024 2:03 PM MRN: [id number] Endoscopist: Lupita FORBES Commander , MD, [id number] Age: 83 Referring MD:  Date of Birth: 15-Nov-1940 Gender: Female Account #: [id number] Procedure:                Upper GI endoscopy Indications:              Iron deficiency anemia, Dysphagia Medicines:                Monitored Anesthesia Care Procedure:                Pre-Anesthesia Assessment:                           - Prior to the procedure, a History and Physical                            was performed, and patient medications and                            allergies were reviewed. The patient's tolerance of                            previous anesthesia was also reviewed. The risks                            and benefits of the procedure and the sedation                            options and risks were discussed with the patient.                            All questions were answered, and informed consent                            was obtained. Prior Anticoagulants: The patient has                            taken no anticoagulant or antiplatelet agents. ASA                            Grade Assessment: III - A patient with severe                            systemic disease. After reviewing the risks and                            benefits, the patient was deemed in satisfactory                            condition to undergo the procedure.                           After obtaining informed consent, the endoscope was  passed under direct vision. Throughout the                            procedure, the patient's blood pressure, pulse, and                            oxygen saturations were monitored continuously. The                            Olympus Scope SN [id number] was introduced through the                            mouth, and advanced to the second part of duodenum.                            The  upper GI endoscopy was accomplished without                            difficulty. The patient tolerated the procedure                            well. Scope In: Scope Out: Findings:                 The examined esophagus was moderately tortuous. The                            scope was withdrawn. Dilation was performed with a                            Maloney dilator with mild resistance at 54 Fr. The                            dilation site was examined following endoscope                            reinsertion and showed no change. Estimated blood                            loss: none.                           Diffuse inflammation characterized by erythema and                            granularity was found in the entire examined                            stomach. Biopsies were taken with a cold forceps                            for histology. Verification of patient                            identification for the specimen was  done. Estimated                            blood loss was minimal.                           A single 10 mm submucosal papule (nodule) with no                            bleeding and no stigmata of recent bleeding was                            found on the anterior wall of the gastric antrum.                            Biopsies were taken with a cold forceps for                            histology. Verification of patient identification                            for the specimen was done. Estimated blood loss was                            minimal.                           The cardia and gastric fundus were normal on                            retroflexion.                           The exam was otherwise without abnormality. Complications:            No immediate complications. Estimated Blood Loss:     Estimated blood loss was minimal. Impression:               - Tortuous esophagus. Dilated 54 Fr Maloney (past                            dilations  have helpeed some).                           - Chronic gastritis. Biopsied. [place] protocol.                            Stomach looks atrophic.                           - A single submucosal papule (nodule) found in the                            stomach. Biopsied. 10 mm size, firm. Not a sorce of  iron deficiency.                           - The examination was otherwise normal. Recommendation:           - Patient has a contact number available for                            emergencies. The signs and symptoms of potential                            delayed complications were discussed with the                            patient. Return to normal activities tomorrow.                            Written discharge instructions were provided to the                            patient.                           - Clear liquids x 1 hour then soft foods rest of                            day. Start prior diet tomorrow.                           - Continue present medications.                           - Await pathology results.                           - See the other procedure note for documentation of                            additional recommendations. Lupita FORBES Commander, MD 07/11/2024 2:52:35 PM This report has been signed electronically.

## 2024-07-11 NOTE — Progress Notes (Signed)
 History and Physical Interval Note:  07/11/2024 2:10 PM  Elizabeth Gray  has presented today for endoscopic procedure(s), with the diagnosis of  Encounter Diagnosis  Name Primary?   Iron deficiency anemia, unspecified iron deficiency anemia type Yes  .  The various methods of evaluation and treatment have been discussed with the patient and/or family. After consideration of risks, benefits and other options for treatment, the patient has consented to  the endoscopic procedure(s).   The patient's history has been reviewed, patient examined, no change in status, stable for endoscopic procedure(s).  I have reviewed the patient's chart and labs.  Questions were answered to the patient's satisfaction.     Elizabeth CHARLENA Commander, MD, NOLIA

## 2024-07-12 ENCOUNTER — Telehealth: Payer: Self-pay | Admitting: *Deleted

## 2024-07-12 ENCOUNTER — Other Ambulatory Visit: Payer: Self-pay | Admitting: Internal Medicine

## 2024-07-12 NOTE — Telephone Encounter (Signed)
 No answer for follow up call. Unable to leave VM.

## 2024-07-12 NOTE — Telephone Encounter (Signed)
 Inbound call from patient stating that she is doing fine and is currently at work. Please advise.

## 2024-07-16 DIAGNOSIS — M5431 Sciatica, right side: Secondary | ICD-10-CM | POA: Diagnosis not present

## 2024-07-16 DIAGNOSIS — M9903 Segmental and somatic dysfunction of lumbar region: Secondary | ICD-10-CM | POA: Diagnosis not present

## 2024-07-16 DIAGNOSIS — M9905 Segmental and somatic dysfunction of pelvic region: Secondary | ICD-10-CM | POA: Diagnosis not present

## 2024-07-16 DIAGNOSIS — M9904 Segmental and somatic dysfunction of sacral region: Secondary | ICD-10-CM | POA: Diagnosis not present

## 2024-07-17 LAB — SURGICAL PATHOLOGY

## 2024-07-19 ENCOUNTER — Ambulatory Visit: Payer: Self-pay | Admitting: Internal Medicine

## 2024-08-08 ENCOUNTER — Ambulatory Visit: Admitting: Internal Medicine

## 2024-12-17 ENCOUNTER — Ambulatory Visit: Admitting: Internal Medicine

## 2025-03-10 ENCOUNTER — Ambulatory Visit
